# Patient Record
Sex: Female | Born: 1945 | Race: White | Hispanic: No | Marital: Married | Smoking: Former smoker
Health system: Southern US, Community
[De-identification: ages and names within clinical notes are randomized; demographics above are authoritative.]

## PROBLEM LIST (undated history)

## (undated) DIAGNOSIS — Z9889 Other specified postprocedural states: Secondary | ICD-10-CM

## (undated) DIAGNOSIS — M810 Age-related osteoporosis without current pathological fracture: Secondary | ICD-10-CM

## (undated) DIAGNOSIS — R112 Nausea with vomiting, unspecified: Secondary | ICD-10-CM

## (undated) DIAGNOSIS — K7460 Unspecified cirrhosis of liver: Secondary | ICD-10-CM

## (undated) DIAGNOSIS — J45909 Unspecified asthma, uncomplicated: Secondary | ICD-10-CM

## (undated) DIAGNOSIS — E785 Hyperlipidemia, unspecified: Secondary | ICD-10-CM

## (undated) DIAGNOSIS — N3010 Interstitial cystitis (chronic) without hematuria: Secondary | ICD-10-CM

## (undated) DIAGNOSIS — F32A Depression, unspecified: Secondary | ICD-10-CM

## (undated) DIAGNOSIS — J449 Chronic obstructive pulmonary disease, unspecified: Secondary | ICD-10-CM

## (undated) DIAGNOSIS — IMO0001 Reserved for inherently not codable concepts without codable children: Secondary | ICD-10-CM

## (undated) DIAGNOSIS — C801 Malignant (primary) neoplasm, unspecified: Secondary | ICD-10-CM

## (undated) DIAGNOSIS — S42309A Unspecified fracture of shaft of humerus, unspecified arm, initial encounter for closed fracture: Secondary | ICD-10-CM

## (undated) DIAGNOSIS — K219 Gastro-esophageal reflux disease without esophagitis: Secondary | ICD-10-CM

## (undated) DIAGNOSIS — I10 Essential (primary) hypertension: Secondary | ICD-10-CM

## (undated) DIAGNOSIS — F329 Major depressive disorder, single episode, unspecified: Secondary | ICD-10-CM

## (undated) DIAGNOSIS — J189 Pneumonia, unspecified organism: Secondary | ICD-10-CM

## (undated) DIAGNOSIS — F419 Anxiety disorder, unspecified: Secondary | ICD-10-CM

## (undated) DIAGNOSIS — E1140 Type 2 diabetes mellitus with diabetic neuropathy, unspecified: Secondary | ICD-10-CM

## (undated) DIAGNOSIS — M549 Dorsalgia, unspecified: Secondary | ICD-10-CM

## (undated) HISTORY — DX: Major depressive disorder, single episode, unspecified: F32.9

## (undated) HISTORY — DX: Age-related osteoporosis without current pathological fracture: M81.0

## (undated) HISTORY — DX: Malignant (primary) neoplasm, unspecified: C80.1

## (undated) HISTORY — DX: Depression, unspecified: F32.A

## (undated) HISTORY — DX: Unspecified asthma, uncomplicated: J45.909

## (undated) HISTORY — DX: Type 2 diabetes mellitus with diabetic neuropathy, unspecified: E11.40

## (undated) HISTORY — DX: Interstitial cystitis (chronic) without hematuria: N30.10

## (undated) HISTORY — DX: Unspecified fracture of shaft of humerus, unspecified arm, initial encounter for closed fracture: S42.309A

---

## 1984-03-05 HISTORY — PX: ABDOMINAL HYSTERECTOMY: SHX81

## 2003-01-05 ENCOUNTER — Other Ambulatory Visit: Admission: RE | Admit: 2003-01-05 | Discharge: 2003-01-05 | Payer: Self-pay | Admitting: Family Medicine

## 2004-05-15 ENCOUNTER — Other Ambulatory Visit: Admission: RE | Admit: 2004-05-15 | Discharge: 2004-05-15 | Payer: Self-pay | Admitting: *Deleted

## 2005-12-11 ENCOUNTER — Other Ambulatory Visit: Admission: RE | Admit: 2005-12-11 | Discharge: 2005-12-11 | Payer: Self-pay | Admitting: Family Medicine

## 2009-12-23 ENCOUNTER — Emergency Department (HOSPITAL_COMMUNITY): Admission: EM | Admit: 2009-12-23 | Discharge: 2009-12-23 | Payer: Self-pay | Admitting: Emergency Medicine

## 2010-02-01 ENCOUNTER — Encounter: Admission: RE | Admit: 2010-02-01 | Discharge: 2010-05-02 | Payer: Self-pay | Admitting: Family Medicine

## 2011-02-18 ENCOUNTER — Emergency Department (HOSPITAL_COMMUNITY)
Admission: EM | Admit: 2011-02-18 | Discharge: 2011-02-19 | Disposition: A | Discharge status: Home / Self Care | Payer: Medicare Other | Attending: Emergency Medicine | Admitting: Emergency Medicine

## 2011-02-18 ENCOUNTER — Emergency Department (HOSPITAL_COMMUNITY): Payer: Medicare Other

## 2011-02-18 DIAGNOSIS — M25559 Pain in unspecified hip: Secondary | ICD-10-CM | POA: Insufficient documentation

## 2011-02-18 DIAGNOSIS — J449 Chronic obstructive pulmonary disease, unspecified: Secondary | ICD-10-CM | POA: Insufficient documentation

## 2011-02-18 DIAGNOSIS — S42213A Unspecified displaced fracture of surgical neck of unspecified humerus, initial encounter for closed fracture: Secondary | ICD-10-CM | POA: Insufficient documentation

## 2011-02-18 DIAGNOSIS — S42293A Other displaced fracture of upper end of unspecified humerus, initial encounter for closed fracture: Secondary | ICD-10-CM | POA: Insufficient documentation

## 2011-02-18 DIAGNOSIS — J4489 Other specified chronic obstructive pulmonary disease: Secondary | ICD-10-CM | POA: Insufficient documentation

## 2011-02-18 DIAGNOSIS — R296 Repeated falls: Secondary | ICD-10-CM | POA: Insufficient documentation

## 2011-02-18 DIAGNOSIS — Y92009 Unspecified place in unspecified non-institutional (private) residence as the place of occurrence of the external cause: Secondary | ICD-10-CM | POA: Insufficient documentation

## 2011-02-18 DIAGNOSIS — M25519 Pain in unspecified shoulder: Secondary | ICD-10-CM | POA: Insufficient documentation

## 2011-02-18 DIAGNOSIS — E119 Type 2 diabetes mellitus without complications: Secondary | ICD-10-CM | POA: Insufficient documentation

## 2011-02-19 ENCOUNTER — Emergency Department (HOSPITAL_COMMUNITY): Payer: Medicare Other

## 2011-02-22 ENCOUNTER — Encounter: Payer: Self-pay | Admitting: Nurse Practitioner

## 2011-02-22 DIAGNOSIS — F418 Other specified anxiety disorders: Secondary | ICD-10-CM

## 2011-02-22 DIAGNOSIS — E119 Type 2 diabetes mellitus without complications: Secondary | ICD-10-CM | POA: Insufficient documentation

## 2011-02-22 DIAGNOSIS — E785 Hyperlipidemia, unspecified: Secondary | ICD-10-CM | POA: Insufficient documentation

## 2011-02-22 DIAGNOSIS — I10 Essential (primary) hypertension: Secondary | ICD-10-CM

## 2011-05-14 ENCOUNTER — Ambulatory Visit: Payer: Medicare Other | Attending: Orthopedic Surgery | Admitting: Physical Therapy

## 2011-05-14 DIAGNOSIS — IMO0001 Reserved for inherently not codable concepts without codable children: Secondary | ICD-10-CM | POA: Insufficient documentation

## 2011-05-14 DIAGNOSIS — R5381 Other malaise: Secondary | ICD-10-CM | POA: Insufficient documentation

## 2011-05-14 DIAGNOSIS — M25519 Pain in unspecified shoulder: Secondary | ICD-10-CM | POA: Insufficient documentation

## 2011-05-14 DIAGNOSIS — M25619 Stiffness of unspecified shoulder, not elsewhere classified: Secondary | ICD-10-CM | POA: Insufficient documentation

## 2011-05-16 ENCOUNTER — Ambulatory Visit: Payer: Medicare Other | Admitting: Physical Therapy

## 2011-05-23 ENCOUNTER — Encounter: Payer: Medicare Other | Admitting: Physical Therapy

## 2011-05-25 ENCOUNTER — Encounter: Payer: Medicare Other | Admitting: *Deleted

## 2011-05-28 ENCOUNTER — Encounter: Payer: Medicare Other | Admitting: Physical Therapy

## 2011-05-29 ENCOUNTER — Ambulatory Visit: Payer: Medicare Other | Admitting: *Deleted

## 2011-05-31 ENCOUNTER — Encounter: Payer: Medicare Other | Admitting: *Deleted

## 2011-06-02 ENCOUNTER — Emergency Department (HOSPITAL_COMMUNITY)
Admission: EM | Admit: 2011-06-02 | Discharge: 2011-06-02 | Disposition: A | Discharge status: Home / Self Care | Payer: Medicare Other | Attending: Emergency Medicine | Admitting: Emergency Medicine

## 2011-06-02 ENCOUNTER — Emergency Department (HOSPITAL_COMMUNITY): Payer: Medicare Other

## 2011-06-02 ENCOUNTER — Encounter: Payer: Self-pay | Admitting: Emergency Medicine

## 2011-06-02 DIAGNOSIS — R197 Diarrhea, unspecified: Secondary | ICD-10-CM | POA: Insufficient documentation

## 2011-06-02 DIAGNOSIS — R109 Unspecified abdominal pain: Secondary | ICD-10-CM

## 2011-06-02 DIAGNOSIS — R112 Nausea with vomiting, unspecified: Secondary | ICD-10-CM | POA: Insufficient documentation

## 2011-06-02 DIAGNOSIS — J449 Chronic obstructive pulmonary disease, unspecified: Secondary | ICD-10-CM | POA: Insufficient documentation

## 2011-06-02 DIAGNOSIS — R1031 Right lower quadrant pain: Secondary | ICD-10-CM | POA: Insufficient documentation

## 2011-06-02 DIAGNOSIS — F411 Generalized anxiety disorder: Secondary | ICD-10-CM

## 2011-06-02 DIAGNOSIS — I10 Essential (primary) hypertension: Secondary | ICD-10-CM | POA: Insufficient documentation

## 2011-06-02 DIAGNOSIS — Z79899 Other long term (current) drug therapy: Secondary | ICD-10-CM | POA: Insufficient documentation

## 2011-06-02 DIAGNOSIS — E119 Type 2 diabetes mellitus without complications: Secondary | ICD-10-CM | POA: Insufficient documentation

## 2011-06-02 DIAGNOSIS — J4489 Other specified chronic obstructive pulmonary disease: Secondary | ICD-10-CM | POA: Insufficient documentation

## 2011-06-02 HISTORY — DX: Hyperlipidemia, unspecified: E78.5

## 2011-06-02 HISTORY — DX: Essential (primary) hypertension: I10

## 2011-06-02 HISTORY — DX: Chronic obstructive pulmonary disease, unspecified: J44.9

## 2011-06-02 HISTORY — DX: Anxiety disorder, unspecified: F41.9

## 2011-06-02 LAB — DIFFERENTIAL
Lymphocytes Relative: 42 % (ref 12–46)
Lymphs Abs: 2.7 10*3/uL (ref 0.7–4.0)
Neutro Abs: 3.1 10*3/uL (ref 1.7–7.7)
Neutrophils Relative %: 49 % (ref 43–77)

## 2011-06-02 LAB — URINALYSIS, ROUTINE W REFLEX MICROSCOPIC
Glucose, UA: NEGATIVE mg/dL
Leukocytes, UA: NEGATIVE
Protein, ur: NEGATIVE mg/dL
Specific Gravity, Urine: <1.005 — ABNORMAL LOW (ref 1.005–1.030)
Urobilinogen, UA: 0.2 mg/dL (ref 0.0–1.0)

## 2011-06-02 LAB — WET PREP, GENITAL
Trich, Wet Prep: NONE SEEN
Yeast Wet Prep HPF POC: NONE SEEN

## 2011-06-02 LAB — BASIC METABOLIC PANEL
CO2: 23 mEq/L (ref 19–32)
Calcium: 9.4 mg/dL (ref 8.4–10.5)
GFR calc non Af Amer: >60 mL/min (ref >60)
Sodium: 142 mEq/L (ref 135–145)

## 2011-06-02 LAB — CBC
Platelets: 174 10*3/uL (ref 150–400)
RBC: 4.63 MIL/uL (ref 3.87–5.11)
WBC: 6.3 10*3/uL (ref 4.0–10.5)

## 2011-06-02 MED ORDER — LORAZEPAM 1 MG PO TABS: 1.0000 mg | ORAL_TABLET | Freq: Two times a day (BID) | ORAL | Status: AC | PRN

## 2011-06-02 MED ORDER — ONDANSETRON HCL 4 MG/2ML IJ SOLN
4.0000 mg | Freq: Once | INTRAMUSCULAR | Status: AC
  Administered 2011-06-02: 4 mg via INTRAVENOUS
  Filled 2011-06-02: qty 2

## 2011-06-02 MED ORDER — MORPHINE SULFATE 4 MG/ML IJ SOLN
4.0000 mg | Freq: Once | INTRAMUSCULAR | Status: DC
  Filled 2011-06-02: qty 1

## 2011-06-02 MED ORDER — SODIUM CHLORIDE 0.9 % IV SOLN
Freq: Once | INTRAVENOUS | Status: AC
  Administered 2011-06-02: 08:00:00 via INTRAVENOUS

## 2011-06-02 MED ORDER — LORAZEPAM 2 MG/ML IJ SOLN
1.0000 mg | Freq: Once | INTRAMUSCULAR | Status: AC
  Administered 2011-06-02: 1 mg via INTRAVENOUS
  Filled 2011-06-02: qty 1

## 2011-06-02 MED ORDER — IOHEXOL 300 MG/ML  SOLN
100.0000 mL | Freq: Once | INTRAMUSCULAR | Status: AC | PRN
  Administered 2011-06-02: 100 mL via INTRAVENOUS

## 2011-06-02 NOTE — ED Notes (Signed)
Pt drinking water contrast at this time.

## 2011-06-02 NOTE — ED Provider Notes (Signed)
Scribed for Dr. Ethelda Chick, the patient was seen in room 8. This chart was scribed by Jannette Fogo. This patient's care was started at 07:27.   History     Chief Complaint  Patient presents with  . Nausea  . Emesis  . Abdominal Pain   HPI Diana Pratt is a 65 y.o. female with a history of diabetes mellitus, COPD, anxiety, and high cholesterol who presents to the Emergency Department complaining of RLQ abdominal pain with started 2 days ago. Patient states pain has been intermittent since onset but today at 03:45AM it got worse and she was having difficulty sleeping due to pain. Abdominal pain is an "8"/10 in severity and it is exacerbated by urination. Patient reports associated mild nausea, one episode of emesis, two episodes of diarrhea yesterday, and burning with urination which radiates to the vagina. She denies any fever or hematochezia. Has not taken any medications for pain today. Patient reports a history of similar symptoms with a previous UTI but urine was negative at that time. Last normal bowel movement was Thursday 05/31/11.  Additionally, patient denies any recent headache, trouble breathing, back pain, but has a history of chronic back pain and right shoulder pain from a previous fracture. She reports chronic anxiety and ran out of her Ativan 2 days ago. Patient had a normal pelvic exam ~2 years ago per patient. She denies receiving a CT abdomen in the past. Reports a history of partial hysterectomy but no appendectomy.      Past Medical History  Diagnosis Date  . Hypertension   . COPD (chronic obstructive pulmonary disease)   . Diabetes mellitus   . Anxiety   . Hyperlipemia   Chronic right shoulder pain Chronic back pain.   Past Surgical History  Procedure Date  . Abdominal hysterectomy 03/1984    partial    MEDICATIONS:  Previous Medications   ACETAMINOPHEN (TYLENOL) 325 MG TABLET    Take 650 mg by mouth every 6 (six) hours as needed. pain   ALBUTEROL  (PROVENTIL) (2.5 MG/3ML) 0.083% NEBULIZER SOLUTION    Take 2.5 mg by nebulization every 6 (six) hours as needed. Shortness of breath    ALBUTEROL SULFATE (PROAIR HFA IN)    Inhale into the lungs. 2 inhalations QID prn   AMLODIPINE-VALSARTAN-HCTZ (EXFORGE HCT) 5-160-25 MG TABS    Take 1 tablet by mouth daily.     ASPIRIN 81 MG EC TABLET    Take 81 mg by mouth daily.     CALCIUM CARBONATE 200 MG CAPSULE    Take 250 mg by mouth daily.    CHOLECALCIFEROL (VITAMIN D) 2000 UNITS CAPS    Take by mouth.    ERGOCALCIFEROL (VITAMIN D2) 50000 UNITS CAPSULE    Take 50,000 Units by mouth once a week.     FLUTICASONE (FLONASE) 50 MCG/ACT NASAL SPRAY    Place 2 sprays into the nose daily as needed. Congestion    GLIPIZIDE (GLUCOTROL) 5 MG TABLET    Take 5 mg by mouth daily.    HYDROXYPROPYL METHYLCELLULOSE (ISOPTO TEARS) 2.5 % OPHTHALMIC SOLUTION    Place 1 drop into both eyes daily as needed. for dry eyes    LISINOPRIL-HYDROCHLOROTHIAZIDE (PRINZIDE,ZESTORETIC) 20-25 MG PER TABLET    Take 1 tablet by mouth daily.     LORAZEPAM (ATIVAN) 1 MG TABLET    Take 1 mg by mouth every 8 (eight) hours. One bid prn for anxiety   MELOXICAM (MOBIC) 15 MG TABLET    Take 15  mg by mouth daily.     METFORMIN (GLUCOPHAGE) 1000 MG TABLET    Take 1,000 mg by mouth 2 (two) times daily with a meal.     MULTIPLE VITAMINS-MINERALS (CENTRUM SILVER PO)    Take 1 tablet by mouth.    NAPROXEN SODIUM (ANAPROX) 220 MG TABLET    Take 220 mg by mouth as needed.     NEBIVOLOL (BYSTOLIC) 5 MG TABLET    Take 5 mg by mouth daily.     OXYCODONE-ACETAMINOPHEN (PERCOCET) 5-325 MG PER TABLET    Take 2 tablets by mouth every 4 (four) hours as needed.     PIOGLITAZONE (ACTOS) 30 MG TABLET    Take 30 mg by mouth daily.     ROSUVASTATIN (CRESTOR) 10 MG TABLET    Take 10 mg by mouth daily. Patient takes on Mon, Wed, Fri     ALLERGIES:  Allergies as of 06/02/2011 - Review Complete 06/02/2011  Allergen Reaction Noted  . Morphine and related Other (See  Comments) 06/02/2011  . Penicillins  02/22/2011  . Simvastatin Other (See Comments) 02/22/2011  . Sulfa antibiotics  02/22/2011  . Zetia (ezetimibe) Cough 02/22/2011  Morphine causes itching.     Family History  Problem Relation Age of Onset  . Parkinsonism Mother     History  Substance Use Topics  . Smoking status: Former Smoker    Quit date: 06/01/1994  . Smokeless tobacco: Never Used  . Alcohol Use: Yes     once a month  Accompanied to the ED by female companion ?spouse.   Review of Systems  Constitutional: Negative.   HENT: Negative.   Respiratory: Negative.   Cardiovascular: Negative.   Gastrointestinal: Positive for nausea, vomiting, abdominal pain and diarrhea.  Musculoskeletal: Positive for back pain (chronic).       Chronic right shoulder pain from history of fracture.   Skin: Negative.   Neurological: Negative.   Hematological: Negative.   Psychiatric/Behavioral: Negative.   All other systems reviewed and are negative.    Physical Exam  BP 145/73  Pulse 58  Temp(Src) 97.3 F (36.3 C) (Oral)  Resp 18  Ht 5\' 4"  (1.626 m)  Wt 204 lb (92.534 kg)  BMI 35.02 kg/m2  SpO2 95%  Physical Exam  Constitutional: She is oriented to person, place, and time. She appears well-developed and well-nourished.       Mildly anxious  HENT:  Head: Normocephalic and atraumatic.       Moist mucus membranes   Eyes: Conjunctivae are normal. Pupils are equal, round, and reactive to light.  Neck: Neck supple. Carotid bruit is not present. No tracheal deviation present. No thyromegaly present.  Cardiovascular: Normal rate, regular rhythm and normal heart sounds.   No murmur heard. Pulmonary/Chest: Effort normal and breath sounds normal.  Abdominal: Soft. Bowel sounds are normal. There is tenderness (mild RLQ). There is no guarding and no CVA tenderness.       Obese  Genitourinary:       Normal external genitalia. No lesions. Cervix is absent. White discharge. Minimal right  adnexal tenderness. No masses.   Musculoskeletal: Normal range of motion. She exhibits no edema and no tenderness.       Moving all extremities well.   Neurological: She is alert and oriented to person, place, and time. Coordination normal.  Skin: Skin is warm and dry. No rash noted.  Psychiatric: She has a normal mood and affect.     OTHER DATA REVIEWED: Nursing notes, vital signs,  and past medical records reviewed.   DIAGNOSTIC STUDIES: Oxygen Saturation is 99% on roomair, normal by my interpretation.   LABS / RADIOLOGY:  Results for orders placed during the hospital encounter of 06/02/11  URINALYSIS, ROUTINE W REFLEX MICROSCOPIC      Component Value Range   Color, Urine YELLOW  YELLOW    Appearance CLEAR  CLEAR    Specific Gravity, Urine <1.005 (*) 1.005 - 1.030    pH 6.0  5.0 - 8.0    Glucose, UA NEGATIVE  NEGATIVE (mg/dL)   Hgb urine dipstick NEGATIVE  NEGATIVE    Bilirubin Urine NEGATIVE  NEGATIVE    Ketones, ur NEGATIVE  NEGATIVE (mg/dL)   Protein, ur NEGATIVE  NEGATIVE (mg/dL)   Urobilinogen, UA 0.2  0.0 - 1.0 (mg/dL)   Nitrite NEGATIVE  NEGATIVE    Leukocytes, UA NEGATIVE  NEGATIVE   BASIC METABOLIC PANEL      Component Value Range   Sodium 142  135 - 145 (mEq/L)   Potassium 3.6  3.5 - 5.1 (mEq/L)   Chloride 102  96 - 112 (mEq/L)   CO2 23  19 - 32 (mEq/L)   Glucose, Bld 157 (*) 70 - 99 (mg/dL)   BUN 10  6 - 23 (mg/dL)   Creatinine, Ser 1.61  0.50 - 1.10 (mg/dL)   Calcium 9.4  8.4 - 09.6 (mg/dL)   GFR calc non Af Amer >60  >60 (mL/min)   GFR calc Af Amer >60  >60 (mL/min)  CBC      Component Value Range   WBC 6.3  4.0 - 10.5 (K/uL)   RBC 4.63  3.87 - 5.11 (MIL/uL)   Hemoglobin 14.5  12.0 - 15.0 (g/dL)   HCT 04.5  40.9 - 81.1 (%)   MCV 90.9  78.0 - 100.0 (fL)   MCH 31.3  26.0 - 34.0 (pg)   MCHC 34.4  30.0 - 36.0 (g/dL)   RDW 91.4  78.2 - 95.6 (%)   Platelets 174  150 - 400 (K/uL)  DIFFERENTIAL      Component Value Range   Neutrophils Relative 49  43 -  77 (%)   Neutro Abs 3.1  1.7 - 7.7 (K/uL)   Lymphocytes Relative 42  12 - 46 (%)   Lymphs Abs 2.7  0.7 - 4.0 (K/uL)   Monocytes Relative 7  3 - 12 (%)   Monocytes Absolute 0.5  0.1 - 1.0 (K/uL)   Eosinophils Relative 1  0 - 5 (%)   Eosinophils Absolute 0.1  0.0 - 0.7 (K/uL)   Basophils Relative 0  0 - 1 (%)   Basophils Absolute 0.0  0.0 - 0.1 (K/uL)  WET PREP, GENITAL      Component Value Range   Yeast, Wet Prep NONE SEEN  NONE SEEN    Trich, Wet Prep NONE SEEN  NONE SEEN    Clue Cells, Wet Prep NONE SEEN  NONE SEEN    WBC, Wet Prep HPF POC FEW (*) NONE SEEN     CT Abdomen: Interpreted by Radiologist Dr. Colin Rhein SHICK 1. No acute intra-abdominal or pelvic process. Normal appendix. 2. Bibasilar atelectasis 3. Probable tiny hepatic cyst.   ED COURSE / COORDINATION OF CARE: 07:45 - Patient requesting medication for anxiety. Morphine canceled and Ativan ordered.  08:00 - ED physician at bedside performing pelvic exam.  09:27 - Re-examined by ED physician, patient feels improved, discomfort improved.  11:38 - Re-examined, ED physician discussed results with the patient and  family. Patient feels improved, desired an RX of Ativan.     MDM: Take Tylenol for pain and Ativan BID dispensed #6, no refills.  Follow up with Dr. Christell Constant on Monday June 04, 2011.    IMPRESSION: 1. Nonspecific Abdominal pain  2. Anxiety    PLAN: Discharge  Follow up with Dr. Christell Constant on Monday June 04, 2011.   CONDITION ON DISCHARGE: Stable    MEDICATIONS GIVEN IN THE E.D.  Medications  oxyCODONE-acetaminophen (PERCOCET) 5-325 MG per tablet (not administered)  naproxen sodium (ANAPROX) 220 MG tablet (not administered)  ergocalciferol (VITAMIN D2) 50000 UNITS capsule (not administered)  Amlodipine-Valsartan-HCTZ (EXFORGE HCT) 5-160-25 MG TABS (not administered)  hydroxypropyl methylcellulose (ISOPTO TEARS) 2.5 % ophthalmic solution (not administered)  albuterol (PROVENTIL) (2.5 MG/3ML) 0.083%  nebulizer solution (not administered)  ondansetron (ZOFRAN) injection 4 mg (4 mg Intravenous Given 06/02/11 0814)  LORazepam (ATIVAN) injection 1 mg (1 mg Intravenous Given 06/02/11 0813)  0.9 %  sodium chloride infusion (  Intravenous New Bag 06/02/11 0813)  iohexol (OMNIPAQUE) 300 MG/ML injection 100 mL (100 mL Intravenous Contrast Given 06/02/11 1010)     DISCHARGE MEDICATIONS: New Prescriptions   No medications on file    Procedures   I personally performed the services described in this documentation, which was scribed in my presence.  The recorded information has been reviewed and considered.     Doug Sou, MD 06/02/11 1150

## 2011-06-02 NOTE — ED Notes (Signed)
In with edp to perform pelvic. Pt tolerated well with some pain and states she feels really dry in her vagina

## 2011-06-02 NOTE — ED Notes (Signed)
Patient c/o right side abd pain with nausea and vomiting that started this morning. Patient states "It feels like something is going to fall out of my vagina." Patient reports having diarrhea yesterday.

## 2011-06-02 NOTE — ED Notes (Signed)
Iv started and meds being given. Pt states she really wants s/t for her nerves. Has been out of ativan x 2 days. EDP aware and new orders given. Nad. Awaiting ct

## 2011-06-02 NOTE — ED Notes (Signed)
Pt returned from ct. Nad. No change.

## 2011-06-02 NOTE — ED Notes (Signed)
Pt less shaky and appears less anxious at this time. Aware awaiting BUN/Creat results then will received contrast for ct scan. nad

## 2011-06-02 NOTE — ED Notes (Signed)
Pt in ct at this time ° °

## 2011-06-04 LAB — GC/CHLAMYDIA PROBE AMP, GENITAL: GC Probe Amp, Genital: NEGATIVE

## 2011-06-05 ENCOUNTER — Ambulatory Visit: Payer: Medicare Other | Attending: Orthopedic Surgery | Admitting: Physical Therapy

## 2011-06-05 DIAGNOSIS — M25619 Stiffness of unspecified shoulder, not elsewhere classified: Secondary | ICD-10-CM | POA: Insufficient documentation

## 2011-06-05 DIAGNOSIS — R5381 Other malaise: Secondary | ICD-10-CM | POA: Insufficient documentation

## 2011-06-05 DIAGNOSIS — M25519 Pain in unspecified shoulder: Secondary | ICD-10-CM | POA: Insufficient documentation

## 2011-06-05 DIAGNOSIS — IMO0001 Reserved for inherently not codable concepts without codable children: Secondary | ICD-10-CM | POA: Insufficient documentation

## 2011-06-07 ENCOUNTER — Encounter: Admitting: Physical Therapy

## 2011-06-12 ENCOUNTER — Ambulatory Visit: Payer: Medicare Other | Attending: Orthopedic Surgery | Admitting: *Deleted

## 2011-06-12 DIAGNOSIS — IMO0001 Reserved for inherently not codable concepts without codable children: Secondary | ICD-10-CM | POA: Insufficient documentation

## 2011-06-12 DIAGNOSIS — M25519 Pain in unspecified shoulder: Secondary | ICD-10-CM | POA: Insufficient documentation

## 2011-06-12 DIAGNOSIS — R5381 Other malaise: Secondary | ICD-10-CM | POA: Insufficient documentation

## 2011-06-12 DIAGNOSIS — M25619 Stiffness of unspecified shoulder, not elsewhere classified: Secondary | ICD-10-CM | POA: Insufficient documentation

## 2011-06-14 ENCOUNTER — Ambulatory Visit: Payer: Medicare Other | Admitting: Physical Therapy

## 2011-06-19 ENCOUNTER — Ambulatory Visit: Payer: Medicare Other | Admitting: Physical Therapy

## 2011-06-21 ENCOUNTER — Ambulatory Visit: Payer: Medicare Other | Admitting: Physical Therapy

## 2011-06-26 ENCOUNTER — Encounter: Admitting: Physical Therapy

## 2011-06-28 ENCOUNTER — Ambulatory Visit: Payer: Medicare Other | Admitting: Physical Therapy

## 2011-07-02 ENCOUNTER — Ambulatory Visit: Payer: Medicare Other | Admitting: Physical Therapy

## 2011-07-04 ENCOUNTER — Encounter: Admitting: Physical Therapy

## 2011-07-10 ENCOUNTER — Ambulatory Visit: Payer: Medicare Other | Attending: Orthopedic Surgery | Admitting: Physical Therapy

## 2011-07-10 DIAGNOSIS — M25619 Stiffness of unspecified shoulder, not elsewhere classified: Secondary | ICD-10-CM | POA: Insufficient documentation

## 2011-07-10 DIAGNOSIS — M25519 Pain in unspecified shoulder: Secondary | ICD-10-CM | POA: Insufficient documentation

## 2011-07-10 DIAGNOSIS — IMO0001 Reserved for inherently not codable concepts without codable children: Secondary | ICD-10-CM | POA: Insufficient documentation

## 2011-07-10 DIAGNOSIS — R5381 Other malaise: Secondary | ICD-10-CM | POA: Insufficient documentation

## 2011-07-17 ENCOUNTER — Ambulatory Visit: Payer: Medicare Other | Admitting: *Deleted

## 2011-07-19 ENCOUNTER — Encounter: Admitting: Physical Therapy

## 2011-07-24 ENCOUNTER — Ambulatory Visit: Payer: Medicare Other | Admitting: Physical Therapy

## 2011-07-26 ENCOUNTER — Encounter: Admitting: Physical Therapy

## 2011-07-31 ENCOUNTER — Encounter: Admitting: Physical Therapy

## 2011-08-02 ENCOUNTER — Ambulatory Visit: Payer: Medicare Other | Admitting: Physical Therapy

## 2011-08-07 ENCOUNTER — Encounter: Admitting: Physical Therapy

## 2011-08-09 ENCOUNTER — Ambulatory Visit: Payer: Medicare Other | Attending: Orthopedic Surgery | Admitting: Physical Therapy

## 2011-08-09 DIAGNOSIS — M25619 Stiffness of unspecified shoulder, not elsewhere classified: Secondary | ICD-10-CM | POA: Insufficient documentation

## 2011-08-09 DIAGNOSIS — M25519 Pain in unspecified shoulder: Secondary | ICD-10-CM | POA: Insufficient documentation

## 2011-08-09 DIAGNOSIS — R5381 Other malaise: Secondary | ICD-10-CM | POA: Insufficient documentation

## 2011-08-09 DIAGNOSIS — IMO0001 Reserved for inherently not codable concepts without codable children: Secondary | ICD-10-CM | POA: Insufficient documentation

## 2011-08-14 ENCOUNTER — Ambulatory Visit: Payer: Medicare Other | Admitting: Physical Therapy

## 2013-01-21 ENCOUNTER — Other Ambulatory Visit: Payer: Self-pay | Admitting: *Deleted

## 2013-01-22 ENCOUNTER — Other Ambulatory Visit: Payer: Self-pay | Admitting: *Deleted

## 2013-01-22 ENCOUNTER — Telehealth: Payer: Self-pay | Admitting: Family Medicine

## 2013-01-22 MED ORDER — LORAZEPAM 1 MG PO TABS: ORAL_TABLET | ORAL | Status: DC

## 2013-01-22 NOTE — Telephone Encounter (Signed)
Pt aware to pick up rx at front desk 

## 2013-01-22 NOTE — Telephone Encounter (Signed)
meds refill loraszepamh provair inhaler  [facility]

## 2013-01-22 NOTE — Telephone Encounter (Signed)
I thought I signed and had the prescription faxed to pharmacy today.

## 2013-01-27 ENCOUNTER — Other Ambulatory Visit: Payer: Self-pay | Admitting: *Deleted

## 2013-01-27 MED ORDER — ALBUTEROL SULFATE HFA 108 (90 BASE) MCG/ACT IN AERS: 2.0000 | INHALATION_SPRAY | Freq: Four times a day (QID) | RESPIRATORY_TRACT | Status: DC | PRN

## 2013-02-27 ENCOUNTER — Ambulatory Visit (INDEPENDENT_AMBULATORY_CARE_PROVIDER_SITE_OTHER): Payer: Medicare Other | Admitting: Family Medicine

## 2013-02-27 ENCOUNTER — Encounter: Payer: Self-pay | Admitting: Family Medicine

## 2013-02-27 VITALS — BP 115/84 | HR 75 | Temp 97.2°F | Ht 63.5 in | Wt 195.0 lb

## 2013-02-27 DIAGNOSIS — F411 Generalized anxiety disorder: Secondary | ICD-10-CM

## 2013-02-27 DIAGNOSIS — I10 Essential (primary) hypertension: Secondary | ICD-10-CM

## 2013-02-27 DIAGNOSIS — J45909 Unspecified asthma, uncomplicated: Secondary | ICD-10-CM | POA: Insufficient documentation

## 2013-02-27 DIAGNOSIS — E119 Type 2 diabetes mellitus without complications: Secondary | ICD-10-CM

## 2013-02-27 DIAGNOSIS — E559 Vitamin D deficiency, unspecified: Secondary | ICD-10-CM

## 2013-02-27 DIAGNOSIS — E785 Hyperlipidemia, unspecified: Secondary | ICD-10-CM

## 2013-02-27 LAB — HEPATIC FUNCTION PANEL
ALT: 36 U/L — ABNORMAL HIGH (ref 0–35)
AST: 37 U/L (ref 0–37)
Albumin: 4.2 g/dL (ref 3.5–5.2)
Alkaline Phosphatase: 53 U/L (ref 39–117)
Bilirubin, Direct: 0.1 mg/dL (ref 0.0–0.3)
Indirect Bilirubin: 0.7 mg/dL (ref 0.0–0.9)
Total Bilirubin: 0.8 mg/dL (ref 0.3–1.2)
Total Protein: 7.3 g/dL (ref 6.0–8.3)

## 2013-02-27 LAB — BASIC METABOLIC PANEL WITH GFR
BUN: 14 mg/dL (ref 6–23)
CO2: 27 mEq/L (ref 19–32)
Calcium: 9.6 mg/dL (ref 8.4–10.5)
Chloride: 105 mEq/L (ref 96–112)
Creat: 0.89 mg/dL (ref 0.50–1.10)
GFR, Est African American: 78 mL/min
GFR, Est Non African American: 68 mL/min
Glucose, Bld: 108 mg/dL — ABNORMAL HIGH (ref 70–99)
Potassium: 3.8 mEq/L (ref 3.5–5.3)
Sodium: 141 mEq/L (ref 135–145)

## 2013-02-27 LAB — POCT GLYCOSYLATED HEMOGLOBIN (HGB A1C): Hemoglobin A1C: 5.9

## 2013-02-27 LAB — POCT UA - MICROALBUMIN: Microalbumin Ur, POC: NEGATIVE mg/dL

## 2013-02-27 MED ORDER — FENOFIBRATE 145 MG PO TABS: 145.0000 mg | ORAL_TABLET | Freq: Every day | ORAL | Status: DC

## 2013-02-27 MED ORDER — LORAZEPAM 1 MG PO TABS: ORAL_TABLET | ORAL | Status: DC

## 2013-02-27 MED ORDER — ESCITALOPRAM OXALATE 10 MG PO TABS: 10.0000 mg | ORAL_TABLET | Freq: Every day | ORAL | Status: DC

## 2013-02-27 NOTE — Progress Notes (Signed)
Patient ID: Diana Pratt, female   DOB: October 17, 1946, 67 y.o.   MRN: [id number] SUBJECTIVE: HPI: Patient is here for follow up of Diabetes Mellitus, hyperlipidemia/ hypertension Symptoms of DM:has had no Nocturia ,deniesUrinary Frequency ,denies Blurred vision ,deniesDizziness,denies.Dysuria,deniesparesthesias, deniesextremity pain or ulcers.Marland Kitchendenieschest pain. .has hadan annual eye exam. do check the feet. doescheck CBGs. Average CBG:________.Marland Kitchen deniesto episodes of hypoglycemia. doeshave an emergency hypoglycemic plan. admits toCompliance with medications. deniesProblems with medications.   PMH/PSH: reviewed/updated in Epic  SH/FH: reviewed/updated in Epic  Allergies: reviewed/updated in Epic  Medications: reviewed/updated in Epic  Immunizations: reviewed/updated in Epic  ROS: As above in the HPI. All other systems are stable or negative.  OBJECTIVE: APPEARANCE:  Patient in no acute distress.The patient appeared well nourished and normally developed. Acyanotic. Waist: VITAL SIGNS:  SKIN: warm and  Dry without overt rashes, tattoos and scars  HEAD and Neck: without JVD, Head and scalp: normal Eyes:No scleral icterus. Fundi normal, eye movements normal. Ears: Auricle normal, canal normal, Tympanic membranes normal, insufflation normal. Nose: normal Throat: normal Neck & thyroid: normal  CHEST & LUNGS: Chest wall: normal Lungs: Clear  CVS: Reveals the PMI to be normally located. Regular rhythm, First and Second Heart sounds are normal,  absence of murmurs, rubs or gallops. Peripheral vasculature: Radial pulses: normal Dorsal pedis pulses: normal Posterior pulses: normal  ABDOMEN:  Appearance: normal Benign,, no organomegaly, no masses, no Abdominal Aortic enlargement. No Guarding , no rebound. No Bruits. Bowel sounds: normal  RECTAL: N/A GU: N/A  EXTREMETIES: nonedematous. Both Femoral and Pedal pulses are normal.  MUSCULOSKELETAL:  Spine:  normal Joints: intact  NEUROLOGIC: oriented to time,place and person; nonfocal. Strength is normal Sensory is normal Reflexes are normal Cranial Nerves are normal.  ASSESSMENT: HTN (hypertension) - Plan: BASIC METABOLIC PANEL WITH GFR  Hyperlipemia - Plan: NMR Lipoprofile with Lipids, Hepatic function panel, fenofibrate (TRICOR) 145 MG tablet  Type 2 diabetes mellitus - Plan: POCT glycosylated hemoglobin (Hb A1C), POCT UA - Microalbumin  Unspecified asthma  Generalized anxiety disorder - Plan: LORazepam (ATIVAN) 1 MG tablet, escitalopram (LEXAPRO) 10 MG tablet  Unspecified vitamin D deficiency - Plan: Vitamin D 25 hydroxy   PLAN: Orders Placed This Encounter  Procedures  . NMR Lipoprofile with Lipids  . Vitamin D 25 hydroxy  . BASIC METABOLIC PANEL WITH GFR  . Hepatic function panel  . POCT glycosylated hemoglobin (Hb A1C)  . POCT UA - Microalbumin   Results for orders placed in visit on 02/27/13 (from the past 24 hour(s))  POCT UA - MICROALBUMIN     Status: None   Collection Time    02/27/13 10:15 AM      Result Value Range   Microalbumin Ur, POC neg    POCT GLYCOSYLATED HEMOGLOBIN (HGB A1C)     Status: None   Collection Time    02/27/13 10:16 AM      Result Value Range   Hemoglobin A1C 5.9%     Meds ordered this encounter  Medications  . EXFORGE 5-320 MG per tablet    Sig: Take 1 tablet by mouth daily.   Marland Kitchen DISCONTD: fenofibrate (TRICOR) 145 MG tablet    Sig: Take 145 mg by mouth daily.   . hydrochlorothiazide (HYDRODIURIL) 25 MG tablet    Sig: Take 25 mg by mouth daily.   Marland Kitchen LANTUS SOLOSTAR 100 UNIT/ML injection    Sig: Inject 29 Units into the skin.   . montelukast (SINGULAIR) 10 MG tablet    Sig: Take 10 mg  by mouth at bedtime.   . Blood Glucose Monitoring Suppl (CLEVER CHEK SYSTEM) W/DEVICE KIT    Sig:   . sitaGLIPtin (JANUVIA) 100 MG tablet    Sig: Take 100 mg by mouth daily.  Marland Kitchen LORazepam (ATIVAN) 1 MG tablet    Sig: One bid prn for anxiety     Dispense:  60 tablet    Refill:  0  . fenofibrate (TRICOR) 145 MG tablet    Sig: Take 1 tablet (145 mg total) by mouth daily.    Dispense:  30 tablet    Refill:  5  . escitalopram (LEXAPRO) 10 MG tablet    Sig: Take 1 tablet (10 mg total) by mouth daily.    Dispense:  30 tablet    Refill:  5       Dr Woodroe Mode Recommendations  Diet and Exercise discussed with patient.  For nutrition information, I recommend books:  1).Eat to Live by Dr Monico Hoar. 2).Prevent and Reverse Heart Disease by Dr Suzzette Righter.  Exercise recommendations are:  If unable to walk, then the patient can exercise in a chair 3 times a day. By flapping arms like a bird gently and raising legs outwards to the front.  If ambulatory, the patient can go for walks for 30 minutes 3 times a week. Then increase the intensity and duration as tolerated.  Goal is to try to attain exercise frequency to 5 times a week.  If applicable: Best to perform resistance exercises (machines or weights) 2 days a week and cardio type exercises 3 days per week.  RTC 3 months  Nhat Hearne P. Modesto Charon, M.D.

## 2013-02-27 NOTE — Patient Instructions (Addendum)
    Dr Glynda Soliday's Recommendations  Diet and Exercise discussed with patient.  For nutrition information, I recommend books:  1).Eat to Live by Dr Joel Fuhrman. 2).Prevent and Reverse Heart Disease by Dr Caldwell Esselstyn.  Exercise recommendations are:  If unable to walk, then the patient can exercise in a chair 3 times a day. By flapping arms like a bird gently and raising legs outwards to the front.  If ambulatory, the patient can go for walks for 30 minutes 3 times a week. Then increase the intensity and duration as tolerated.  Goal is to try to attain exercise frequency to 5 times a week.  If applicable: Best to perform resistance exercises (machines or weights) 2 days a week and cardio type exercises 3 days per week.   Diabetes and Exercise Regular exercise is important and can help:   Control blood glucose (sugar).  Decrease blood pressure.    Control blood lipids (cholesterol, triglycerides).  Improve overall health. BENEFITS FROM EXERCISE  Improved fitness.  Improved flexibility.  Improved endurance.  Increased bone density.  Weight control.  Increased muscle strength.  Decreased body fat.  Improvement of the body's use of insulin, a hormone.  Increased insulin sensitivity.  Reduction of insulin needs.  Reduced stress and tension.  Helps you feel better. People with diabetes who add exercise to their lifestyle gain additional benefits, including:  Weight loss.  Reduced appetite.  Improvement of the body's use of blood glucose.  Decreased risk factors for heart disease:  Lowering of cholesterol and triglycerides.  Raising the level of good cholesterol (high-density lipoproteins, HDL).  Lowering blood sugar.  Decreased blood pressure. TYPE 1 DIABETES AND EXERCISE  Exercise will usually lower your blood glucose.  If blood glucose is greater than 240 mg/dl, check urine ketones. If ketones are present, do not exercise.  Location  of the insulin injection sites may need to be adjusted with exercise. Avoid injecting insulin into areas of the body that will be exercised. For example, avoid injecting insulin into:  The arms when playing tennis.  The legs when jogging. For more information, discuss this with your caregiver.  Keep a record of:  Food intake.  Type and amount of exercise.  Expected peak times of insulin action.  Blood glucose levels. Do this before, during, and after exercise. Review your records with your caregiver. This will help you to develop guidelines for adjusting food intake and insulin amounts.  TYPE 2 DIABETES AND EXERCISE  Regular physical activity can help control blood glucose.  Exercise is important because it may:  Increase the body's sensitivity to insulin.  Improve blood glucose control.  Exercise reduces the risk of heart disease. It decreases serum cholesterol and triglycerides. It also lowers blood pressure.  Those who take insulin or oral hypoglycemic agents should watch for signs of hypoglycemia. These signs include dizziness, shaking, sweating, chills, and confusion.  Body water is lost during exercise. It must be replaced. This will help to avoid loss of body fluids (dehydration) or heat stroke. Be sure to talk to your caregiver before starting an exercise program to make sure it is safe for you. Remember, any activity is better than none.  Document Released: 01/12/2004 Document Revised: 01/14/2012 Document Reviewed: 04/28/2009 ExitCare Patient Information 2013 [employer].  Diabetes and Foot Care Diabetes may cause you to have a poor blood supply (circulation) to your legs and feet. Because of this, the skin may be thinner, break easier, and heal more slowly. You   also may have nerve damage in your legs and feet causing decreased feeling. You may not notice minor injuries to your feet that could lead to serious problems or infections. Taking care of your feet is one of  the most important things you can do for yourself.  HOME CARE INSTRUCTIONS  Do not go barefoot. Bare feet are easily injured.  Check your feet daily for blisters, cuts, and redness.  Wash your feet with warm water (not hot) and mild soap. Pat your feet and between your toes until completely dry.  Apply a moisturizing lotion that does not contain alcohol or petroleum jelly to the dry skin on your feet and to dry brittle toenails. Do not put it between your toes.  Trim your toenails straight across. Do not dig under them or around the cuticle.  Do not cut corns or calluses, or try to remove them with medicine.  Wear clean cotton socks or stockings every day. Make sure they are not too tight. Do not wear knee high stockings since they may decrease blood flow to your legs.  Wear leather shoes that fit properly and have enough cushioning. To break in new shoes, wear them just a few hours a day to avoid injuring your feet.  Wear shoes at all times, even in the house.  Do not cross your legs. This may decrease the blood flow to your feet.  If you find a minor scrape, cut, or break in the skin on your feet, keep it and the skin around it clean and dry. These areas may be cleansed with mild soap and water. Do not use peroxide, alcohol, iodine or Merthiolate.  When you remove an adhesive bandage, be sure not to harm the skin around it.  If you have a wound, look at it several times a day to make sure it is healing.  Do not use heating pads or hot water bottles. Burns can occur. If you have lost feeling in your feet or legs, you may not know it is happening until it is too late.  Report any cuts, sores or bruises to your caregiver. Do not wait! SEEK MEDICAL CARE IF:   You have an injury that is not healing or you notice redness, numbness, burning, or tingling.  Your feet always feel cold.  You have pain or cramps in your legs and feet. SEEK IMMEDIATE MEDICAL CARE IF:   There is  increasing redness, swelling, or increasing pain in the wound.  There is a red line that goes up your leg.  Pus is coming from a wound.  You develop an unexplained oral temperature above 102 F (38.9 C), or as your caregiver suggests.  You notice a bad smell coming from an ulcer or wound. MAKE SURE YOU:   Understand these instructions.  Will watch your condition.  Will get help right away if you are not doing well or get worse. Document Released: 10/19/2000 Document Revised: 01/14/2012 Document Reviewed: 04/27/2009 ExitCare Patient Information 2013 [employer].  

## 2013-02-28 LAB — VITAMIN D 25 HYDROXY (VIT D DEFICIENCY, FRACTURES): Vit D, 25-Hydroxy: 21 ng/mL — ABNORMAL LOW (ref 30–89)

## 2013-03-02 LAB — NMR LIPOPROFILE WITH LIPIDS
Cholesterol, Total: 256 mg/dL — ABNORMAL HIGH (ref <200)
HDL Particle Number: 25.7 umol/L — ABNORMAL LOW (ref >=30.5)
HDL Size: 8.4 nm — ABNORMAL LOW (ref >=9.2)
HDL-C: 38 mg/dL — ABNORMAL LOW (ref >=40)
LDL (calc): 171 mg/dL — ABNORMAL HIGH (ref <100)
LDL Particle Number: 2974 nmol/L — ABNORMAL HIGH (ref <1000)
LDL Size: 19.8 nm — ABNORMAL LOW (ref >20.5)
LP-IR Score: 76 — ABNORMAL HIGH (ref <=45)
Large HDL-P: <1.3 umol/L — ABNORMAL LOW (ref >=4.8)
Large VLDL-P: 4.8 nmol/L — ABNORMAL HIGH (ref <=2.7)
Small LDL Particle Number: 2245 nmol/L — ABNORMAL HIGH (ref <=527)
Triglycerides: 236 mg/dL — ABNORMAL HIGH (ref <150)
VLDL Size: 47.8 nm — ABNORMAL HIGH (ref <=46.6)

## 2013-03-04 ENCOUNTER — Other Ambulatory Visit: Payer: Self-pay | Admitting: Family Medicine

## 2013-03-04 MED ORDER — ROSUVASTATIN CALCIUM 20 MG PO TABS: 20.0000 mg | ORAL_TABLET | Freq: Every day | ORAL | Status: DC

## 2013-04-14 ENCOUNTER — Other Ambulatory Visit: Payer: Self-pay | Admitting: *Deleted

## 2013-04-14 DIAGNOSIS — F411 Generalized anxiety disorder: Secondary | ICD-10-CM

## 2013-04-14 NOTE — Telephone Encounter (Signed)
LAST RF 02/27/13. CALL IN [facility] IF APPROVED. ROUTE TO NURSE. LAST OV 02/27/13.

## 2013-04-15 ENCOUNTER — Telehealth: Payer: Self-pay | Admitting: Family Medicine

## 2013-04-15 MED ORDER — LORAZEPAM 1 MG PO TABS: ORAL_TABLET | ORAL | Status: DC

## 2013-04-16 ENCOUNTER — Other Ambulatory Visit: Payer: Self-pay | Admitting: Family Medicine

## 2013-04-16 DIAGNOSIS — F411 Generalized anxiety disorder: Secondary | ICD-10-CM

## 2013-04-16 MED ORDER — LORAZEPAM 1 MG PO TABS: ORAL_TABLET | ORAL | Status: DC

## 2013-04-16 NOTE — Telephone Encounter (Signed)
Ativan called in per request dr Elyse Hsu rx

## 2013-04-16 NOTE — Telephone Encounter (Signed)
Phone in please.

## 2013-05-29 ENCOUNTER — Ambulatory Visit (INDEPENDENT_AMBULATORY_CARE_PROVIDER_SITE_OTHER): Payer: Medicare Other

## 2013-05-29 ENCOUNTER — Ambulatory Visit (INDEPENDENT_AMBULATORY_CARE_PROVIDER_SITE_OTHER): Payer: Medicare Other | Admitting: Family Medicine

## 2013-05-29 ENCOUNTER — Encounter: Payer: Self-pay | Admitting: Family Medicine

## 2013-05-29 VITALS — BP 141/69 | HR 98 | Temp 97.5°F | Ht 63.0 in | Wt 194.8 lb

## 2013-05-29 DIAGNOSIS — E119 Type 2 diabetes mellitus without complications: Secondary | ICD-10-CM

## 2013-05-29 DIAGNOSIS — I10 Essential (primary) hypertension: Secondary | ICD-10-CM

## 2013-05-29 DIAGNOSIS — F411 Generalized anxiety disorder: Secondary | ICD-10-CM

## 2013-05-29 DIAGNOSIS — IMO0002 Reserved for concepts with insufficient information to code with codable children: Secondary | ICD-10-CM

## 2013-05-29 DIAGNOSIS — D4989 Neoplasm of unspecified behavior of other specified sites: Secondary | ICD-10-CM

## 2013-05-29 DIAGNOSIS — E559 Vitamin D deficiency, unspecified: Secondary | ICD-10-CM | POA: Insufficient documentation

## 2013-05-29 DIAGNOSIS — E785 Hyperlipidemia, unspecified: Secondary | ICD-10-CM

## 2013-05-29 DIAGNOSIS — S39012A Strain of muscle, fascia and tendon of lower back, initial encounter: Secondary | ICD-10-CM

## 2013-05-29 DIAGNOSIS — M5416 Radiculopathy, lumbar region: Secondary | ICD-10-CM

## 2013-05-29 LAB — COMPLETE METABOLIC PANEL WITH GFR
ALT: 37 U/L — ABNORMAL HIGH (ref 0–35)
AST: 34 U/L (ref 0–37)
Albumin: 4.3 g/dL (ref 3.5–5.2)
Alkaline Phosphatase: 57 U/L (ref 39–117)
BUN: 12 mg/dL (ref 6–23)
CO2: 27 mEq/L (ref 19–32)
Calcium: 9.9 mg/dL (ref 8.4–10.5)
Chloride: 104 mEq/L (ref 96–112)
Creat: 0.84 mg/dL (ref 0.50–1.10)
GFR, Est African American: 83 mL/min
GFR, Est Non African American: 72 mL/min
Glucose, Bld: 82 mg/dL (ref 70–99)
Potassium: 4.7 mEq/L (ref 3.5–5.3)
Sodium: 142 mEq/L (ref 135–145)
Total Bilirubin: 0.6 mg/dL (ref 0.3–1.2)
Total Protein: 7.1 g/dL (ref 6.0–8.3)

## 2013-05-29 LAB — POCT GLYCOSYLATED HEMOGLOBIN (HGB A1C): Hemoglobin A1C: 5.7

## 2013-05-29 LAB — POCT UA - MICROALBUMIN: Microalbumin Ur, POC: 0 mg/L

## 2013-05-29 MED ORDER — INSULIN GLARGINE 100 UNIT/ML SOLOSTAR PEN: 29.0000 [IU] | PEN_INJECTOR | Freq: Every day | SUBCUTANEOUS | Status: DC

## 2013-05-29 MED ORDER — LORAZEPAM 1 MG PO TABS: ORAL_TABLET | ORAL | Status: DC

## 2013-05-29 NOTE — Patient Instructions (Signed)
      Dr Patric Vanpelt's Recommendations  Diet and Exercise discussed with patient.  For nutrition information, I recommend books:  1).Eat to Live by Dr Joel Fuhrman. 2).Prevent and Reverse Heart Disease by Dr Caldwell Esselstyn. 3) Dr Neal Barnard's Book:  Program to Reverse Diabetes  Exercise recommendations are:  If unable to walk, then the patient can exercise in a chair 3 times a day. By flapping arms like a bird gently and raising legs outwards to the front.  If ambulatory, the patient can go for walks for 30 minutes 3 times a week. Then increase the intensity and duration as tolerated.  Goal is to try to attain exercise frequency to 5 times a week.  If applicable: Best to perform resistance exercises (machines or weights) 2 days a week and cardio type exercises 3 days per week.  

## 2013-05-29 NOTE — Progress Notes (Signed)
Patient ID: Diana Pratt, female   DOB: [birth year], 67 y.o.   MRN: [id number] SUBJECTIVE: CC: Chief Complaint  Patient presents with  . follow up 3 month    fasting c/o back pain after doing crunches . pain in back and left leg burning pain    HPI: Patient is here for follow up of Diabetes Mellitus: Symptoms of DM: Denies Nocturia ,Denies Urinary Frequency , denies Blurred vision ,deniesDizziness,denies.Dysuria,denies paresthesias, denies extremity pain or ulcers.Marland Kitchendenies chest pain. has had an annual eye exam. do check the feet. Does check CBGs. Average CBG:110-130 Denies episodes of hypoglycemia. Does have an emergency hypoglycemic plan. admits toCompliance with medications. Denies Problems with medications.  Hurt her back, burning pain and it shot down the leg.  Past Medical History  Diagnosis Date  . Hypertension   . COPD (chronic obstructive pulmonary disease)   . Diabetes mellitus   . Anxiety   . Hyperlipemia   . Asthma   . Depression    Past Surgical History  Procedure Laterality Date  . Abdominal hysterectomy  03/1984    partial   History   Social History  . Marital Status: Married    Spouse Name: N/A    Number of Children: N/A  . Years of Education: N/A   Occupational History  . Not on file.   Social History Main Topics  . Smoking status: Former Smoker    Quit date: 06/01/1994  . Smokeless tobacco: Never Used  . Alcohol Use: Yes     Comment: once a month  . Drug Use: No  . Sexually Active: No   Other Topics Concern  . Not on file   Social History Narrative  . No narrative on file   Family History  Problem Relation Age of Onset  . Parkinsonism Mother    Current Outpatient Prescriptions on File Prior to Visit  Medication Sig Dispense Refill  . acetaminophen (TYLENOL) 325 MG tablet Take 650 mg by mouth every 6 (six) hours as needed. pain      . albuterol (PROAIR HFA) 108 (90 BASE) MCG/ACT inhaler Inhale 2 puffs into the lungs every 6 (six)  hours as needed.  8.5 g  0  . albuterol (PROVENTIL) (2.5 MG/3ML) 0.083% nebulizer solution Take 2.5 mg by nebulization every 6 (six) hours as needed. Shortness of breath       . aspirin 81 MG EC tablet Take 81 mg by mouth daily.        . Blood Glucose Monitoring Suppl (CLEVER CHEK SYSTEM) W/DEVICE KIT       . calcium carbonate 200 MG capsule Take 250 mg by mouth daily.       . Cholecalciferol (VITAMIN D) 2000 UNITS CAPS Take by mouth.       . escitalopram (LEXAPRO) 10 MG tablet Take 1 tablet (10 mg total) by mouth daily.  30 tablet  5  . EXFORGE 5-320 MG per tablet Take 1 tablet by mouth daily.       . fenofibrate (TRICOR) 145 MG tablet Take 1 tablet (145 mg total) by mouth daily.  30 tablet  5  . fluticasone (FLONASE) 50 MCG/ACT nasal spray Place 2 sprays into the nose daily as needed. Congestion       . glipiZIDE (GLUCOTROL) 5 MG tablet Take 5 mg by mouth daily.       . hydroxypropyl methylcellulose (ISOPTO TEARS) 2.5 % ophthalmic solution Place 1 drop into both eyes daily as needed. for dry eyes       .  montelukast (SINGULAIR) 10 MG tablet Take 10 mg by mouth at bedtime.       . Multiple Vitamins-Minerals (CENTRUM SILVER PO) Take 1 tablet by mouth.       . naproxen sodium (ANAPROX) 220 MG tablet Take 220 mg by mouth as needed.        . sitaGLIPtin (JANUVIA) 100 MG tablet Take 100 mg by mouth daily.      . hydrochlorothiazide (HYDRODIURIL) 25 MG tablet Take 25 mg by mouth daily.       . metFORMIN (GLUCOPHAGE) 1000 MG tablet Take 250 mg by mouth daily with breakfast. 1/4 tablet daily      . rosuvastatin (CRESTOR) 20 MG tablet Take 1 tablet (20 mg total) by mouth daily. Patient takes on Mon, Wed, Fri  30 tablet  3   No current facility-administered medications on file prior to visit.   Allergies  Allergen Reactions  . Morphine And Related Other (See Comments)    Burning, itching  . Penicillins   . Simvastatin Other (See Comments)    myalgias  . Sulfa Antibiotics   . Zetia (Ezetimibe)  Cough   Immunization History  Administered Date(s) Administered  . DTaP 01/04/2003   Prior to Admission medications   Medication Sig Start Date End Date Taking? Authorizing Provider  acetaminophen (TYLENOL) 325 MG tablet Take 650 mg by mouth every 6 (six) hours as needed. pain   Yes Historical Provider, MD  albuterol (PROAIR HFA) 108 (90 BASE) MCG/ACT inhaler Inhale 2 puffs into the lungs every 6 (six) hours as needed. 01/27/13  Yes Ileana Ladd, MD  albuterol (PROVENTIL) (2.5 MG/3ML) 0.083% nebulizer solution Take 2.5 mg by nebulization every 6 (six) hours as needed. Shortness of breath    Yes Historical Provider, MD  Alcohol Swabs (ALCOHOL PADS) 70 % PADS  04/06/13  Yes Historical Provider, MD  aspirin 81 MG EC tablet Take 81 mg by mouth daily.     Yes Historical Provider, MD  Blood Glucose Monitoring Suppl (CLEVER CHEK SYSTEM) W/DEVICE KIT  12/11/12  Yes Historical Provider, MD  calcium carbonate 200 MG capsule Take 250 mg by mouth daily.    Yes Historical Provider, MD  Cholecalciferol (VITAMIN D) 2000 UNITS CAPS Take by mouth.    Yes Historical Provider, MD  escitalopram (LEXAPRO) 10 MG tablet Take 1 tablet (10 mg total) by mouth daily. 02/27/13  Yes Ileana Ladd, MD  EXFORGE 5-320 MG per tablet Take 1 tablet by mouth daily.  01/20/13  Yes Historical Provider, MD  fenofibrate (TRICOR) 145 MG tablet Take 1 tablet (145 mg total) by mouth daily. 02/27/13  Yes Ileana Ladd, MD  fluticasone (FLONASE) 50 MCG/ACT nasal spray Place 2 sprays into the nose daily as needed. Congestion    Yes Historical Provider, MD  glipiZIDE (GLUCOTROL) 5 MG tablet Take 5 mg by mouth daily.    Yes Historical Provider, MD  hydroxypropyl methylcellulose (ISOPTO TEARS) 2.5 % ophthalmic solution Place 1 drop into both eyes daily as needed. for dry eyes    Yes Historical Provider, MD  Insulin Glargine (LANTUS SOLOSTAR) 100 UNIT/ML SOPN Inject 29 Units into the skin daily at 10 pm. 05/29/13  Yes Ileana Ladd, MD  LANCETS  ULTRA THIN MISC  04/06/13  Yes Historical Provider, MD  LORazepam (ATIVAN) 1 MG tablet One bid prn for anxiety 05/29/13  Yes Ileana Ladd, MD  montelukast (SINGULAIR) 10 MG tablet Take 10 mg by mouth at bedtime.  12/26/12  Yes Historical  Provider, MD  Multiple Vitamins-Minerals (CENTRUM SILVER PO) Take 1 tablet by mouth.    Yes Historical Provider, MD  naproxen sodium (ANAPROX) 220 MG tablet Take 220 mg by mouth as needed.     Yes Historical Provider, MD  sitaGLIPtin (JANUVIA) 100 MG tablet Take 100 mg by mouth daily.   Yes Historical Provider, MD  glucose blood test strip  04/06/13   Historical Provider, MD  hydrochlorothiazide (HYDRODIURIL) 25 MG tablet Take 25 mg by mouth daily.  01/20/13   Historical Provider, MD  metFORMIN (GLUCOPHAGE) 1000 MG tablet Take 250 mg by mouth daily with breakfast. 1/4 tablet daily    Historical Provider, MD  rosuvastatin (CRESTOR) 20 MG tablet Take 1 tablet (20 mg total) by mouth daily. Patient takes on Mon, Wed, Fri 03/04/13   Ileana Ladd, MD    ROS: As above in the HPI. All other systems are stable or negative.  OBJECTIVE: APPEARANCE:  Patient in no acute distress.The patient appeared well nourished and normally developed. Acyanotic. Waist: VITAL SIGNS:BP 141/69  Pulse 98  Temp(Src) 97.5 F (36.4 C) (Oral)  Ht 5\' 3"  (1.6 m)  Wt 194 lb 12.8 oz (88.361 kg)  BMI 34.52 kg/m2  Obese WF SKIN: warm and  Dry without overt rashes, tattoos and scars.has a warty lesion on the right external nostril  HEAD and Neck: without JVD, Head and scalp: normal Eyes:No scleral icterus. Fundi normal, eye movements normal. Ears: Auricle normal, canal normal, Tympanic membranes normal, insufflation normal. Nose: normal nasal mucosa Ok. The  External nostril has the fingerlike warty lesion. Throat: normal Neck & thyroid: normal  CHEST & LUNGS: Chest wall: normal Lungs: Clear  CVS: Reveals the PMI to be normally located. Regular rhythm, First and Second Heart sounds  are normal,  absence of murmurs, rubs or gallops. Peripheral vasculature: Radial pulses: normal Dorsal pedis pulses: normal Posterior pulses: normal  ABDOMEN:  Appearance obese Benign, no organomegaly, no masses, no Abdominal Aortic enlargement. No Guarding , no rebound. No Bruits. Bowel sounds: normal  RECTAL: N/A GU: N/A  EXTREMETIES: nonedematous. Both Femoral and Pedal pulses are normal.  MUSCULOSKELETAL:  Spine: SLR positive. Reduced ROM. Joints: intact  NEUROLOGIC: oriented to time,place and person; nonfocal. Strength is normal Sensory is normal Reflexes are normal Cranial Nerves are normal.  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  For nutrition information, I recommend books:  1).Eat to Live by Dr Monico Hoar. 2).Prevent and Reverse Heart Disease by Dr Suzzette Righter. 3) Dr Katherina Right Book:  Program to Reverse Diabetes  Exercise recommendations are:  If unable to walk, then the patient can exercise in a chair 3 times a day. By flapping arms like a bird gently and raising legs outwards to the front.  If ambulatory, the  patient can go for walks for 30 minutes 3 times a week. Then increase the  intensity and duration as tolerated.  Goal is to try to attain exercise frequency to 5 times a week.  If applicable: Best to perform resistance exercises (machines or weights) 2 days a week and cardio type exercises 3 days per week.  [facility] reading (PRIMARY) by  Dr. Modesto Charon:  Degenerative changes. Otherwise no other significant acute changes.                            Orders Placed This Encounter  Procedures  . DG Lumbar Spine 2-3 Views    Standing Status: Future     Number of Occurrences: 1     Standing Expiration Date: 07/29/2014    Order Specific Question:  Reason for Exam (SYMPTOM  OR DIAGNOSIS REQUIRED)    Answer:  radicular low back painafter back strain    Order Specific Question:  Preferred imaging location?    Answer:  Internal  . COMPLETE METABOLIC PANEL WITH GFR  . NMR Lipoprofile with Lipids  . Vitamin D 25 hydroxy  . Ambulatory referral to Dermatology    Referral Priority:  Routine    Referral Type:  Consultation    Referral Reason:  Specialty Services Required    Requested Specialty:  Dermatology    Number of Visits Requested:  1  . POCT glycosylated hemoglobin (Hb A1C)  . POCT UA - Microalbumin   Meds ordered this encounter  Medications  . Alcohol Swabs (ALCOHOL PADS) 70 % PADS    Sig:   . LANCETS ULTRA THIN MISC    Sig:   . glucose blood test strip    Sig:   . DISCONTD: LORazepam (ATIVAN) 1 MG tablet    Sig: One bid prn for anxiety    Dispense:  60 tablet    Refill:  2  . Insulin Glargine (LANTUS SOLOSTAR) 100 UNIT/ML SOPN    Sig: Inject 29 Units into the skin daily at 10 pm.    Dispense:  12 mL    Refill:  11  . LORazepam (ATIVAN) 1 MG tablet    Sig: One bid prn for anxiety    Dispense:  60 tablet    Refill:  2    Return in about 3 months (around 08/29/2013) for Recheck medical problems.  Almina Schul P. Modesto Charon, M.D.

## 2013-05-30 LAB — VITAMIN D 25 HYDROXY (VIT D DEFICIENCY, FRACTURES): Vit D, 25-Hydroxy: 26 ng/mL — ABNORMAL LOW (ref 30–89)

## 2013-06-01 LAB — NMR LIPOPROFILE WITH LIPIDS
Cholesterol, Total: 243 mg/dL — ABNORMAL HIGH (ref <200)
HDL Particle Number: 20.2 umol/L — ABNORMAL LOW (ref >=30.5)
HDL Size: 9.3 nm (ref >=9.2)
HDL-C: 30 mg/dL — ABNORMAL LOW (ref >=40)
LDL (calc): 152 mg/dL — ABNORMAL HIGH (ref <100)
LDL Particle Number: 2881 nmol/L — ABNORMAL HIGH (ref <1000)
LDL Size: 19.6 nm — ABNORMAL LOW (ref >20.5)
LP-IR Score: 65 — ABNORMAL HIGH (ref <=45)
Large HDL-P: 2.2 umol/L — ABNORMAL LOW (ref >=4.8)
Large VLDL-P: 8.3 nmol/L — ABNORMAL HIGH (ref <=2.7)
Small LDL Particle Number: >2300 nmol/L — ABNORMAL HIGH (ref <=527)
Triglycerides: 304 mg/dL — ABNORMAL HIGH (ref <150)
VLDL Size: 48.2 nm — ABNORMAL HIGH (ref <=46.6)

## 2013-06-08 ENCOUNTER — Ambulatory Visit (INDEPENDENT_AMBULATORY_CARE_PROVIDER_SITE_OTHER): Payer: Medicare Other | Admitting: Pharmacist

## 2013-06-08 VITALS — BP 132/77 | HR 80 | Ht 63.0 in | Wt 196.0 lb

## 2013-06-08 DIAGNOSIS — E785 Hyperlipidemia, unspecified: Secondary | ICD-10-CM

## 2013-06-08 MED ORDER — PITAVASTATIN CALCIUM 2 MG PO TABS: 2.0000 mg | ORAL_TABLET | Freq: Every day | ORAL | Status: DC

## 2013-06-10 ENCOUNTER — Other Ambulatory Visit: Payer: Self-pay

## 2013-06-10 ENCOUNTER — Encounter: Payer: Self-pay | Admitting: Pharmacist

## 2013-06-10 NOTE — Progress Notes (Signed)
Lipid Clinic Consultation  Chief Complaint:   Chief Complaint  Patient presents with  . Hyperlipidemia    Filed Vitals:   06/10/13 2132  BP: 132/77  Pulse: 80     Exam Edema:  negative Respirations:  normal   Carotid Bruits:  negative Xanthomas:  negative General Appearance:  alert, oriented, no acute distress and obese Mood/Affect:  normal  HPI:  Patient first visit to lipid clinic.  She has tried crestor and lipitor in the past but both caused myalgias.  She has also tried zetia but she states that she a cough with it.   She is currently taking fenofibrate 145mg  daily.     Component Value Date/Time   TRIG 304* 05/29/2013 1037  other lipids from 05/29/13  LDL-P = 2881  LDL-C = 152   HDL-C = 30   CHD/CHF Risk Equivalents:  DM Primary Problem(s):  LDL or LDL-P elevated, HDL or HDL-P decreased and TG elevated  Current NCEP Goals: LDL Goal < 100 HDL Goal >/= 45 Tg Goal < 191 Non-HDL Goal < 130  Secondary cause of hyperlipidemia present:  Type 2 DM - which is currently well controlled - last A1c was 5.7% HBG reading show am fasting BG 104 to 127 Low fat diet followed?  No -   Low carb diet followed?  Yes -   Exercise?  No - due to recent pulled back   Assessment: Hyperlipidemia Type 2 DM  Recommendations: Changes in lipid medication(s):  Add livalo 4mg  (take 1/2 tablet daily) Check BG daily but at varying times - am, pm, before and after meals Discussed diet - limit hight fat food, increase lean proteins, vegetables, whole grains and fruit (1/2 cup serving sizes).   Chair exercises as able Recheck Lipid Panel:  6 weeks Other labs needed:  Thyroid panel / LFT's  Time spent counseling patient:  30 mintues        Laken Lobato, PHARMD

## 2013-07-09 ENCOUNTER — Ambulatory Visit: Payer: Self-pay

## 2013-08-27 ENCOUNTER — Other Ambulatory Visit: Payer: Self-pay | Admitting: Family Medicine

## 2013-09-03 ENCOUNTER — Ambulatory Visit (INDEPENDENT_AMBULATORY_CARE_PROVIDER_SITE_OTHER): Payer: Medicare Other | Admitting: Nurse Practitioner

## 2013-09-03 VITALS — BP 202/88 | HR 92 | Temp 97.8°F | Ht 63.0 in | Wt 195.0 lb

## 2013-09-03 DIAGNOSIS — N390 Urinary tract infection, site not specified: Secondary | ICD-10-CM

## 2013-09-03 DIAGNOSIS — R300 Dysuria: Secondary | ICD-10-CM

## 2013-09-03 LAB — POCT URINALYSIS DIPSTICK
Glucose, UA: NEGATIVE
Nitrite, UA: POSITIVE
Protein, UA: NEGATIVE
Spec Grav, UA: 1.025
Urobilinogen, UA: NEGATIVE

## 2013-09-03 LAB — POCT UA - MICROSCOPIC ONLY
Casts, Ur, LPF, POC: NEGATIVE
Crystals, Ur, HPF, POC: NEGATIVE

## 2013-09-03 MED ORDER — CIPROFLOXACIN HCL 500 MG PO TABS: 500.0000 mg | ORAL_TABLET | Freq: Two times a day (BID) | ORAL | Status: DC

## 2013-09-03 NOTE — Progress Notes (Signed)
Subjective:    Patient ID: Diana Pratt, female    DOB: 1946-01-18, 67 y.o.   MRN: [id number]  Abdominal Pain This is a new problem. The current episode started today. The onset quality is gradual. The problem occurs constantly. The problem has been gradually worsening. The pain is located in the suprapubic region. The pain is at a severity of 5/10. The pain is moderate. The quality of the pain is cramping. Associated symptoms include frequency and nausea. The pain is aggravated by urination. The pain is relieved by nothing. She has tried acetaminophen for the symptoms. The treatment provided no relief.  Urinary Tract Infection  This is a new problem. The current episode started in the past 7 days (Monday). The problem occurs every urination. The problem has been gradually worsening. The quality of the pain is described as burning. The pain is at a severity of 5/10. The pain is moderate. There has been no fever. There is no history of pyelonephritis. Associated symptoms include frequency, nausea and urgency. She has tried acetaminophen for the symptoms. The treatment provided no relief.      Review of Systems  Gastrointestinal: Positive for nausea and abdominal pain.  Genitourinary: Positive for urgency and frequency.  All other systems reviewed and are negative.       Objective:   Physical Exam  Vitals reviewed. Constitutional: She is oriented to person, place, and time. She appears well-developed and well-nourished.  Cardiovascular: Normal rate, regular rhythm, normal heart sounds and intact distal pulses.   Pulmonary/Chest: Effort normal and breath sounds normal.  Abdominal: Soft. Bowel sounds are normal. There is tenderness.  Neurological: She is alert and oriented to person, place, and time.  Psychiatric: She has a normal mood and affect. Her behavior is normal. Judgment and thought content normal.   BP 202/88  Pulse 92  Temp(Src) 97.8 F (36.6 C)  Ht 5\' 3"  (1.6 m)  Wt 195  lb (88.451 kg)  BMI 34.55 kg/m2  Results for orders placed in visit on 09/03/13  POCT UA - MICROSCOPIC ONLY      Result Value Range   WBC, Ur, HPF, POC 10-15     RBC, urine, microscopic 1-5     Bacteria, U Microscopic mod     Mucus, UA neg     Epithelial cells, urine per micros occ     Crystals, Ur, HPF, POC neg     Casts, Ur, LPF, POC neg     Yeast, UA occ    POCT URINALYSIS DIPSTICK      Result Value Range   Color, UA yellow     Clarity, UA cloudy     Glucose, UA neg     Bilirubin, UA neg     Ketones, UA neg     Spec Grav, UA 1.025     Blood, UA trace     pH, UA 5.0     Protein, UA neg     Urobilinogen, UA negative     Nitrite, UA pos     Leukocytes, UA Trace           Assessment & Plan:   1. Dysuria   2. UTI (urinary tract infection)    Meds ordered this encounter  Medications  . ciprofloxacin (CIPRO) 500 MG tablet    Sig: Take 1 tablet (500 mg total) by mouth 2 (two) times daily.    Dispense:  14 tablet    Refill:  0    Order Specific Question:  Supervising Provider    Answer:  Ernestina Penna [[id number]]   Force fluids- cranberry juice AZO standard OTC RTO prn  Mary-Margaret Daphine Deutscher, FNP

## 2013-09-03 NOTE — Patient Instructions (Signed)
Urinary Tract Infection  Urinary tract infections (UTIs) can develop anywhere along your urinary tract. Your urinary tract is your body's drainage system for removing wastes and extra water. Your urinary tract includes two kidneys, two ureters, a bladder, and a urethra. Your kidneys are a pair of bean-shaped organs. Each kidney is about the size of your fist. They are located below your ribs, one on each side of your spine.  CAUSES  Infections are caused by microbes, which are microscopic organisms, including fungi, viruses, and bacteria. These organisms are so small that they can only be seen through a microscope. Bacteria are the microbes that most commonly cause UTIs.  SYMPTOMS   Symptoms of UTIs may vary by age and gender of the patient and by the location of the infection. Symptoms in young women typically include a frequent and intense urge to urinate and a painful, burning feeling in the bladder or urethra during urination. Older women and men are more likely to be tired, shaky, and weak and have muscle aches and abdominal pain. A fever may mean the infection is in your kidneys. Other symptoms of a kidney infection include pain in your back or sides below the ribs, nausea, and vomiting.  DIAGNOSIS  To diagnose a UTI, your caregiver will ask you about your symptoms. Your caregiver also will ask to provide a urine sample. The urine sample will be tested for bacteria and white blood cells. White blood cells are made by your body to help fight infection.  TREATMENT   Typically, UTIs can be treated with medication. Because most UTIs are caused by a bacterial infection, they usually can be treated with the use of antibiotics. The choice of antibiotic and length of treatment depend on your symptoms and the type of bacteria causing your infection.  HOME CARE INSTRUCTIONS   If you were prescribed antibiotics, take them exactly as your caregiver instructs you. Finish the medication even if you feel better after you  have only taken some of the medication.   Drink enough water and fluids to keep your urine clear or pale yellow.   Avoid caffeine, tea, and carbonated beverages. They tend to irritate your bladder.   Empty your bladder often. Avoid holding urine for long periods of time.   Empty your bladder before and after sexual intercourse.   After a bowel movement, women should cleanse from front to back. Use each tissue only once.  SEEK MEDICAL CARE IF:    You have back pain.   You develop a fever.   Your symptoms do not begin to resolve within 3 days.  SEEK IMMEDIATE MEDICAL CARE IF:    You have severe back pain or lower abdominal pain.   You develop chills.   You have nausea or vomiting.   You have continued burning or discomfort with urination.  MAKE SURE YOU:    Understand these instructions.   Will watch your condition.   Will get help right away if you are not doing well or get worse.  Document Released: 08/01/2005 Document Revised: 04/22/2012 Document Reviewed: 11/30/2011  [employer] Patient Information 2014 [employer]

## 2013-09-06 ENCOUNTER — Emergency Department (HOSPITAL_COMMUNITY): Payer: Medicare Other

## 2013-09-06 ENCOUNTER — Encounter (HOSPITAL_COMMUNITY): Payer: Self-pay | Admitting: Emergency Medicine

## 2013-09-06 ENCOUNTER — Emergency Department (HOSPITAL_COMMUNITY)
Admission: EM | Admit: 2013-09-06 | Discharge: 2013-09-06 | Disposition: A | Discharge status: Home / Self Care | Payer: Medicare Other | Attending: Emergency Medicine | Admitting: Emergency Medicine

## 2013-09-06 DIAGNOSIS — F329 Major depressive disorder, single episode, unspecified: Secondary | ICD-10-CM | POA: Insufficient documentation

## 2013-09-06 DIAGNOSIS — N390 Urinary tract infection, site not specified: Secondary | ICD-10-CM | POA: Insufficient documentation

## 2013-09-06 DIAGNOSIS — Z792 Long term (current) use of antibiotics: Secondary | ICD-10-CM | POA: Insufficient documentation

## 2013-09-06 DIAGNOSIS — F411 Generalized anxiety disorder: Secondary | ICD-10-CM | POA: Insufficient documentation

## 2013-09-06 DIAGNOSIS — R630 Anorexia: Secondary | ICD-10-CM | POA: Insufficient documentation

## 2013-09-06 DIAGNOSIS — R6883 Chills (without fever): Secondary | ICD-10-CM | POA: Insufficient documentation

## 2013-09-06 DIAGNOSIS — F3289 Other specified depressive episodes: Secondary | ICD-10-CM | POA: Insufficient documentation

## 2013-09-06 DIAGNOSIS — Z880 Allergy status to penicillin: Secondary | ICD-10-CM | POA: Insufficient documentation

## 2013-09-06 DIAGNOSIS — Z794 Long term (current) use of insulin: Secondary | ICD-10-CM | POA: Insufficient documentation

## 2013-09-06 DIAGNOSIS — IMO0002 Reserved for concepts with insufficient information to code with codable children: Secondary | ICD-10-CM | POA: Insufficient documentation

## 2013-09-06 DIAGNOSIS — Z90710 Acquired absence of both cervix and uterus: Secondary | ICD-10-CM | POA: Insufficient documentation

## 2013-09-06 DIAGNOSIS — J449 Chronic obstructive pulmonary disease, unspecified: Secondary | ICD-10-CM | POA: Insufficient documentation

## 2013-09-06 DIAGNOSIS — E119 Type 2 diabetes mellitus without complications: Secondary | ICD-10-CM | POA: Insufficient documentation

## 2013-09-06 DIAGNOSIS — R109 Unspecified abdominal pain: Secondary | ICD-10-CM | POA: Insufficient documentation

## 2013-09-06 DIAGNOSIS — M549 Dorsalgia, unspecified: Secondary | ICD-10-CM | POA: Insufficient documentation

## 2013-09-06 DIAGNOSIS — N760 Acute vaginitis: Secondary | ICD-10-CM | POA: Insufficient documentation

## 2013-09-06 DIAGNOSIS — B9689 Other specified bacterial agents as the cause of diseases classified elsewhere: Secondary | ICD-10-CM

## 2013-09-06 DIAGNOSIS — E785 Hyperlipidemia, unspecified: Secondary | ICD-10-CM | POA: Insufficient documentation

## 2013-09-06 DIAGNOSIS — I10 Essential (primary) hypertension: Secondary | ICD-10-CM | POA: Insufficient documentation

## 2013-09-06 DIAGNOSIS — Z7982 Long term (current) use of aspirin: Secondary | ICD-10-CM | POA: Insufficient documentation

## 2013-09-06 DIAGNOSIS — R110 Nausea: Secondary | ICD-10-CM | POA: Insufficient documentation

## 2013-09-06 DIAGNOSIS — J4489 Other specified chronic obstructive pulmonary disease: Secondary | ICD-10-CM | POA: Insufficient documentation

## 2013-09-06 DIAGNOSIS — Z79899 Other long term (current) drug therapy: Secondary | ICD-10-CM | POA: Insufficient documentation

## 2013-09-06 LAB — CBC WITH DIFFERENTIAL/PLATELET
Eosinophils Relative: 1 % (ref 0–5)
HCT: 43 % (ref 36.0–46.0)
Lymphocytes Relative: 30 % (ref 12–46)
Lymphs Abs: 2.4 10*3/uL (ref 0.7–4.0)
MCV: 90.1 fL (ref 78.0–100.0)
Monocytes Absolute: 0.5 10*3/uL (ref 0.1–1.0)
Monocytes Relative: 6 % (ref 3–12)
Neutro Abs: 5 10*3/uL (ref 1.7–7.7)
RBC: 4.77 MIL/uL (ref 3.87–5.11)
RDW: 12.8 % (ref 11.5–15.5)
WBC: 8 10*3/uL (ref 4.0–10.5)

## 2013-09-06 LAB — URINALYSIS, ROUTINE W REFLEX MICROSCOPIC
Glucose, UA: NEGATIVE mg/dL
Hgb urine dipstick: NEGATIVE
Protein, ur: NEGATIVE mg/dL
Specific Gravity, Urine: >1.03 — ABNORMAL HIGH (ref 1.005–1.030)

## 2013-09-06 LAB — BASIC METABOLIC PANEL
BUN: 20 mg/dL (ref 6–23)
CO2: 25 mEq/L (ref 19–32)
Calcium: 9.8 mg/dL (ref 8.4–10.5)
Creatinine, Ser: 0.87 mg/dL (ref 0.50–1.10)
Glucose, Bld: 104 mg/dL — ABNORMAL HIGH (ref 70–99)

## 2013-09-06 LAB — WET PREP, GENITAL: Yeast Wet Prep HPF POC: NONE SEEN

## 2013-09-06 MED ORDER — OXYCODONE-ACETAMINOPHEN 5-325 MG PO TABS
1.0000 | ORAL_TABLET | Freq: Once | ORAL | Status: AC
  Administered 2013-09-06: 1 via ORAL
  Filled 2013-09-06: qty 1

## 2013-09-06 MED ORDER — OXYCODONE-ACETAMINOPHEN 5-325 MG PO TABS: 1.0000 | ORAL_TABLET | Freq: Four times a day (QID) | ORAL | Status: DC | PRN

## 2013-09-06 MED ORDER — IOHEXOL 300 MG/ML  SOLN
50.0000 mL | Freq: Once | INTRAMUSCULAR | Status: AC | PRN
  Administered 2013-09-06: 50 mL via ORAL

## 2013-09-06 MED ORDER — METRONIDAZOLE 500 MG PO TABS: 500.0000 mg | ORAL_TABLET | Freq: Two times a day (BID) | ORAL | Status: DC

## 2013-09-06 MED ORDER — IOHEXOL 300 MG/ML  SOLN
100.0000 mL | Freq: Once | INTRAMUSCULAR | Status: AC | PRN
  Administered 2013-09-06: 100 mL via INTRAVENOUS

## 2013-09-06 NOTE — ED Notes (Signed)
Dr pickering at bedside  

## 2013-09-06 NOTE — ED Notes (Signed)
Pt c/o uti symptoms that started Monday, had tried treating herself at home with azo and cranberry juice with no improvement, was seen by pcp on Wednesday, placed on Cipro, pt states that she is not getting any better, c/o lower abd and lower back pain.

## 2013-09-06 NOTE — ED Provider Notes (Signed)
CSN: [id number]     Arrival date & time 09/06/13  1219 History  This chart was scribed for [employer]. Rubin Payor, MD by Valera Castle, ED Scribe. This patient was seen in room APA01/APA01 and the patient's care was started at 2:48 PM.    Chief Complaint  Patient presents with  . Urinary Tract Infection   The history is provided by the patient. No language interpreter was used.   HPI Comments: Diana Pratt is a 67 y.o. female who presents to the Emergency Department complaining of sudden, moderate, constant abdomen and back pain, onset 1 week ago. She reports associated pain in her vagina, and that it feels like something is pulling. She also reports associated urinary frequency, burning with urination, mid back pain, decreased appetite, nausea, and chills. She states she has taken AzoYeast and cranberry juice, with no relief. She states she was seen by her PCP 5 days ago, and was placed on Cipro, with no relief. She reports an allergy to Sulfer, Morphine, and Penicillin. She denies fever, diarrhea, vaginal discharge, vaginal bleeding, rashes, and any other associated symptoms. She reports h/o DM.    Past Medical History  Diagnosis Date  . Hypertension   . COPD (chronic obstructive pulmonary disease)   . Diabetes mellitus   . Anxiety   . Hyperlipemia   . Asthma   . Depression    Past Surgical History  Procedure Laterality Date  . Abdominal hysterectomy  03/1984    partial   Family History  Problem Relation Age of Onset  . Parkinsonism Mother    History  Substance Use Topics  . Smoking status: Former Smoker    Quit date: 06/01/1994  . Smokeless tobacco: Never Used  . Alcohol Use: Yes     Comment: once a month   OB History   Grav Para Term Preterm Abortions TAB SAB Ect Mult Living   2 1 1  1     1      Review of Systems  Constitutional: Positive for chills and appetite change (decreased). Negative for fever.  Gastrointestinal: Positive for nausea and abdominal pain.  Negative for diarrhea.  Genitourinary: Positive for dysuria (burning sensation) and frequency. Negative for vaginal bleeding and vaginal discharge.       Positive for vaginal pain.  Musculoskeletal: Positive for back pain (mid).  Skin: Negative for rash.  All other systems reviewed and are negative.    Allergies  Lipitor; Morphine and related; Penicillins; Simvastatin; Sulfa antibiotics; and Zetia  Home Medications   Current Outpatient Rx  Name  Route  Sig  Dispense  Refill  . acetaminophen (TYLENOL) 325 MG tablet   Oral   Take 650 mg by mouth every 6 (six) hours as needed. pain         . albuterol (PROAIR HFA) 108 (90 BASE) MCG/ACT inhaler   Inhalation   Inhale 2 puffs into the lungs every 6 (six) hours as needed.   8.5 g   0   . albuterol (PROVENTIL) (2.5 MG/3ML) 0.083% nebulizer solution   Nebulization   Take 2.5 mg by nebulization every 6 (six) hours as needed. Shortness of breath          . aspirin 81 MG EC tablet   Oral   Take 81 mg by mouth daily.           . calcium carbonate 200 MG capsule   Oral   Take 250 mg by mouth daily.          Marland Kitchen  Cholecalciferol (VITAMIN D) 2000 UNITS CAPS   Oral   Take 2,000 Units by mouth daily.          . ciprofloxacin (CIPRO) 500 MG tablet   Oral   Take 1 tablet (500 mg total) by mouth 2 (two) times daily.   14 tablet   0   . escitalopram (LEXAPRO) 10 MG tablet   Oral   Take 1 tablet (10 mg total) by mouth daily.   30 tablet   5   . EXFORGE 5-320 MG per tablet   Oral   Take 1 tablet by mouth daily.          . fenofibrate (TRICOR) 145 MG tablet   Oral   Take 145 mg by mouth every Monday, Wednesday, and Friday.         . fluticasone (FLONASE) 50 MCG/ACT nasal spray   Nasal   Place 2 sprays into the nose daily as needed. Congestion          . glipiZIDE (GLUCOTROL) 5 MG tablet   Oral   Take 5 mg by mouth daily.          . hydrochlorothiazide (HYDRODIURIL) 25 MG tablet   Oral   Take 25 mg by  mouth daily.          . hydroxypropyl methylcellulose (ISOPTO TEARS) 2.5 % ophthalmic solution   Both Eyes   Place 1 drop into both eyes daily as needed. for dry eyes          . Insulin Glargine (LANTUS SOLOSTAR) 100 UNIT/ML SOPN   Subcutaneous   Inject 29 Units into the skin daily at 10 pm.   12 mL   11   . LORazepam (ATIVAN) 1 MG tablet   Oral   Take 1 mg by mouth 2 (two) times daily as needed for anxiety.         . metFORMIN (GLUCOPHAGE) 1000 MG tablet   Oral   Take 250 mg by mouth daily with breakfast. 1/4 tablet daily         . montelukast (SINGULAIR) 10 MG tablet   Oral   Take 10 mg by mouth at bedtime.          . Multiple Vitamins-Minerals (CENTRUM SILVER PO)   Oral   Take 1 tablet by mouth daily.          . naproxen sodium (ALEVE) 220 MG tablet   Oral   Take 660 mg by mouth daily as needed (for pain).         Marland Kitchen sitaGLIPtin (JANUVIA) 100 MG tablet   Oral   Take 100 mg by mouth daily.         . metroNIDAZOLE (FLAGYL) 500 MG tablet   Oral   Take 1 tablet (500 mg total) by mouth 2 (two) times daily.   14 tablet   0   . oxyCODONE-acetaminophen (PERCOCET/ROXICET) 5-325 MG per tablet   Oral   Take 1-2 tablets by mouth every 6 (six) hours as needed for pain.   10 tablet   0    BP 94/70  Pulse 61  Temp(Src) 98.2 F (36.8 C) (Oral)  Resp 18  SpO2 98%  Physical Exam  Nursing note and vitals reviewed. Constitutional: She is oriented to person, place, and time. She appears well-developed and well-nourished. No distress.  HENT:  Head: Normocephalic and atraumatic.  Eyes: EOM are normal.  Cardiovascular: Normal rate.   Pulmonary/Chest: Effort normal. No respiratory distress. She exhibits  no tenderness.  Abdominal: Soft. There is tenderness (supra pubic). There is no rebound and no guarding.  Genitourinary:  No hernia.  Neurological: She is alert and oriented to person, place, and time.  Skin: Skin is warm and dry.  Psychiatric: She has a  normal mood and affect. Her behavior is normal.   pelvic exam showed some white vaginal discharge. Mild diffuse vaginal tenderness. Mild diffuse pelvic tenderness.  ED Course  Procedures (including critical care time)  COORDINATION OF CARE: 2:54 PM-Discussed treatment plan which includes UA, CBC, and BMP with pt at bedside and pt agreed to plan.   Labs Review Labs Reviewed  WET PREP, GENITAL - Abnormal; Notable for the following:    Clue Cells Wet Prep HPF POC FEW (*)    WBC, Wet Prep HPF POC FEW (*)    All other components within normal limits  URINALYSIS, ROUTINE W REFLEX MICROSCOPIC - Abnormal; Notable for the following:    APPearance HAZY (*)    Specific Gravity, Urine >1.030 (*)    All other components within normal limits  CBC WITH DIFFERENTIAL - Abnormal; Notable for the following:    Hemoglobin 15.1 (*)    All other components within normal limits  BASIC METABOLIC PANEL - Abnormal; Notable for the following:    Glucose, Bld 104 (*)    GFR calc non Af Amer 67 (*)    GFR calc Af Amer 78 (*)    All other components within normal limits  GC/CHLAMYDIA PROBE AMP   Imaging Review Ct Abdomen Pelvis W Contrast  09/06/2013   CLINICAL DATA:  Lower abdominal pain.  Previous hysterectomy.  EXAM: CT ABDOMEN AND PELVIS WITH CONTRAST  TECHNIQUE: Multidetector CT imaging of the abdomen and pelvis was performed using the standard protocol following bolus administration of intravenous contrast.  CONTRAST:  OMNIPAQUE IOHEXOL 300 MG/ML  SOLN  COMPARISON:  06/02/2011  FINDINGS: The liver, gallbladder, pancreas, spleen, adrenal glands, and kidneys are normal in appearance. No evidence of hydronephrosis. Prior hysterectomy noted. Adnexal regions are unremarkable in appearance.  No soft tissue masses or lymphadenopathy identified within the abdomen or pelvis. No evidence of inflammatory process or abnormal fluid collections. Normal appendix is visualized. No evidence of bowel wall thickening,  dilatation, or hernia.  IMPRESSION: Negative. No acute findings or other significant abnormality identified.   Electronically Signed   By: Myles Rosenthal M.D.   On: 09/06/2013 18:19    EKG Interpretation   None       MDM   1. Abdominal pain   2. Bacterial vaginosis    Patient with lower abdominal/pelvic pain. Has been treated for urinary tract infection. Labs reviewed and it did show a urinary tract infection at the doctor's office. Urine here does not show infection. CT scan was done without acute findings. We'll treat for bacterial vaginosis. We'll also get pain medicines. Urine culture is sent. Will not change antibiotics since urine has improved.     I personally performed the services described in this documentation, which was scribed in my presence. The recorded information has been reviewed and is accurate.     Juliet Rude. Rubin Payor, MD 09/06/13 1929

## 2013-09-07 LAB — GC/CHLAMYDIA PROBE AMP: GC Probe RNA: NEGATIVE

## 2013-09-10 ENCOUNTER — Other Ambulatory Visit: Payer: Self-pay

## 2013-09-11 ENCOUNTER — Ambulatory Visit (INDEPENDENT_AMBULATORY_CARE_PROVIDER_SITE_OTHER): Payer: Medicare Other | Admitting: Family Medicine

## 2013-09-11 ENCOUNTER — Other Ambulatory Visit: Payer: Self-pay | Admitting: Family Medicine

## 2013-09-11 ENCOUNTER — Encounter: Payer: Self-pay | Admitting: Family Medicine

## 2013-09-11 VITALS — BP 158/72 | HR 73 | Temp 98.0°F | Ht 63.0 in | Wt 195.2 lb

## 2013-09-11 DIAGNOSIS — E785 Hyperlipidemia, unspecified: Secondary | ICD-10-CM

## 2013-09-11 DIAGNOSIS — E559 Vitamin D deficiency, unspecified: Secondary | ICD-10-CM

## 2013-09-11 DIAGNOSIS — F418 Other specified anxiety disorders: Secondary | ICD-10-CM

## 2013-09-11 DIAGNOSIS — N3010 Interstitial cystitis (chronic) without hematuria: Secondary | ICD-10-CM

## 2013-09-11 DIAGNOSIS — N760 Acute vaginitis: Secondary | ICD-10-CM

## 2013-09-11 DIAGNOSIS — E119 Type 2 diabetes mellitus without complications: Secondary | ICD-10-CM

## 2013-09-11 DIAGNOSIS — I10 Essential (primary) hypertension: Secondary | ICD-10-CM

## 2013-09-11 DIAGNOSIS — F341 Dysthymic disorder: Secondary | ICD-10-CM

## 2013-09-11 LAB — POCT GLYCOSYLATED HEMOGLOBIN (HGB A1C): Hemoglobin A1C: 5.4

## 2013-09-11 MED ORDER — LORAZEPAM 1 MG PO TABS: 1.0000 mg | ORAL_TABLET | Freq: Two times a day (BID) | ORAL | Status: DC | PRN

## 2013-09-11 MED ORDER — METFORMIN HCL 1000 MG PO TABS: 250.0000 mg | ORAL_TABLET | Freq: Every day | ORAL | Status: DC

## 2013-09-11 MED ORDER — LANCETS ULTRA THIN MISC: 1.0000 | Freq: Every day | Status: DC

## 2013-09-11 MED ORDER — AMLODIPINE BESYLATE-VALSARTAN 5-320 MG PO TABS: 1.0000 | ORAL_TABLET | Freq: Every day | ORAL | Status: DC

## 2013-09-11 MED ORDER — METFORMIN HCL 500 MG PO TABS: 250.0000 mg | ORAL_TABLET | Freq: Every day | ORAL | Status: DC

## 2013-09-11 MED ORDER — FENOFIBRATE 145 MG PO TABS: 145.0000 mg | ORAL_TABLET | ORAL | Status: DC

## 2013-09-11 MED ORDER — FLUCONAZOLE 150 MG PO TABS: 150.0000 mg | ORAL_TABLET | Freq: Once | ORAL | Status: DC

## 2013-09-11 MED ORDER — GLIPIZIDE 5 MG PO TABS: 5.0000 mg | ORAL_TABLET | Freq: Every day | ORAL | Status: DC

## 2013-09-11 MED ORDER — HYDROCHLOROTHIAZIDE 25 MG PO TABS: 25.0000 mg | ORAL_TABLET | Freq: Every day | ORAL | Status: DC

## 2013-09-11 NOTE — Patient Instructions (Signed)
    Dr Tip Atienza's Recommendations  For nutrition information, I recommend books:  1).Eat to Live by Dr Joel Fuhrman. 2).Prevent and Reverse Heart Disease by Dr Caldwell Esselstyn. 3) Dr Neal Barnard's Book:  Program to Reverse Diabetes  Exercise recommendations are:  If unable to walk, then the patient can exercise in a chair 3 times a day. By flapping arms like a bird gently and raising legs outwards to the front.  If ambulatory, the patient can go for walks for 30 minutes 3 times a week. Then increase the intensity and duration as tolerated.  Goal is to try to attain exercise frequency to 5 times a week.  If applicable: Best to perform resistance exercises (machines or weights) 2 days a week and cardio type exercises 3 days per week.   Diabetes and Foot Care Diabetes may cause you to have problems because of poor blood supply (circulation) to your feet and legs. This may cause the skin on your feet to become thinner, break easier, and heal more slowly. Your skin may become dry, and the skin may peel and crack. You may also have nerve damage in your legs and feet causing decreased feeling in them. You may not notice minor injuries to your feet that could lead to infections or more serious problems. Taking care of your feet is one of the most important things you can do for yourself.  HOME CARE INSTRUCTIONS  Wear shoes at all times, even in the house. Do not go barefoot. Bare feet are easily injured.  Check your feet daily for blisters, cuts, and redness. If you cannot see the bottom of your feet, use a mirror or ask someone for help.  Wash your feet with warm water (do not use hot water) and mild soap. Then pat your feet and the areas between your toes until they are completely dry. Do not soak your feet as this can dry your skin.  Apply a moisturizing lotion or petroleum jelly (that does not contain alcohol and is unscented) to the skin on your feet and to dry, brittle toenails.  Do not apply lotion between your toes.  Trim your toenails straight across. Do not dig under them or around the cuticle. File the edges of your nails with an emery board or nail file.  Do not cut corns or calluses or try to remove them with medicine.  Wear clean socks or stockings every day. Make sure they are not too tight. Do not wear knee-high stockings since they may decrease blood flow to your legs.  Wear shoes that fit properly and have enough cushioning. To break in new shoes, wear them for just a few hours a day. This prevents you from injuring your feet. Always look in your shoes before you put them on to be sure there are no objects inside.  Do not cross your legs. This may decrease the blood flow to your feet.  If you find a minor scrape, cut, or break in the skin on your feet, keep it and the skin around it clean and dry. These areas may be cleansed with mild soap and water. Do not cleanse the area with peroxide, alcohol, or iodine.  When you remove an adhesive bandage, be sure not to damage the skin around it.  If you have a wound, look at it several times a day to make sure it is healing.  Do not use heating pads or hot water bottles. They may burn your skin. If   you have lost feeling in your feet or legs, you may not know it is happening until it is too late.  Make sure your health care provider performs a complete foot exam at least annually or more often if you have foot problems. Report any cuts, sores, or bruises to your health care provider immediately. SEEK MEDICAL CARE IF:   You have an injury that is not healing.  You have cuts or breaks in the skin.  You have an ingrown nail.  You notice redness on your legs or feet.  You feel burning or tingling in your legs or feet.  You have pain or cramps in your legs and feet.  Your legs or feet are numb.  Your feet always feel cold. SEEK IMMEDIATE MEDICAL CARE IF:   There is increasing redness, swelling, or pain in  or around a wound.  There is a red line that goes up your leg.  Pus is coming from a wound.  You develop a fever or as directed by your health care provider.  You notice a bad smell coming from an ulcer or wound. Document Released: 10/19/2000 Document Revised: 06/24/2013 Document Reviewed: 03/31/2013 [employer] Patient Information 2014 [employer]  

## 2013-09-11 NOTE — Progress Notes (Signed)
Patient ID: Diana Pratt, female   DOB: 24-Dec-1945, 67 y.o.   MRN: [id number] SUBJECTIVE: CC: Chief Complaint  Patient presents with  . Follow-up    DIABETES  was seen @ [facility]  had pelvic and CT scan was told also had bacterial vaginiitsl   . Medication Refill    exforge and hctz and metformin lorazepam  and glipizide completed antibiotic for UTI  c/o dont think UTI cleard up and stated has seen urologist in past and was told  she had intercitial cystitis.     HPI: Has been getting up with frequency of urine. Was told UTI here then ED told had bacterial vaginosis. Has a yeast infection from all the antibiotics. Used to be treated by Dr Vonita Moss for interstitial cystitis. Has tingling discomfort in the pelvis.especially when she urinates.  Patient is here for follow up of Diabetes Mellitus/HLD/HTN: Symptoms evaluated: Denies Nocturia ,Denies Urinary Frequency , denies Blurred vision ,deniesDizziness,denies.Dysuria,denies paresthesias, denies extremity pain or ulcers.Marland Kitchendenies chest pain. has had an annual eye exam. do check the feet. Does check CBGs. Average CBG:110-120s Denies episodes of hypoglycemia. Does have an emergency hypoglycemic plan. admits toCompliance with medications. Problems with medications. Yeast vaginitis  Past Medical History  Diagnosis Date  . Hypertension   . COPD (chronic obstructive pulmonary disease)   . Diabetes mellitus   . Anxiety   . Hyperlipemia   . Asthma   . Depression   . Interstitial cystitis    Past Surgical History  Procedure Laterality Date  . Abdominal hysterectomy  03/1984    partial   History   Social History  . Marital Status: Married    Spouse Name: N/A    Number of Children: N/A  . Years of Education: N/A   Occupational History  . Not on file.   Social History Main Topics  . Smoking status: Former Smoker    Quit date: 06/01/1994  . Smokeless tobacco: Never Used  . Alcohol Use: Yes     Comment: once a month  . Drug Use:  No  . Sexual Activity: No   Other Topics Concern  . Not on file   Social History Narrative  . No narrative on file   Family History  Problem Relation Age of Onset  . Parkinsonism Mother    Current Outpatient Prescriptions on File Prior to Visit  Medication Sig Dispense Refill  . acetaminophen (TYLENOL) 325 MG tablet Take 650 mg by mouth every 6 (six) hours as needed. pain      . albuterol (PROAIR HFA) 108 (90 BASE) MCG/ACT inhaler Inhale 2 puffs into the lungs every 6 (six) hours as needed.  8.5 g  0  . albuterol (PROVENTIL) (2.5 MG/3ML) 0.083% nebulizer solution Take 2.5 mg by nebulization every 6 (six) hours as needed. Shortness of breath       . aspirin 81 MG EC tablet Take 81 mg by mouth daily.        . calcium carbonate 200 MG capsule Take 250 mg by mouth daily.       . Cholecalciferol (VITAMIN D) 2000 UNITS CAPS Take 2,000 Units by mouth daily.       Marland Kitchen escitalopram (LEXAPRO) 10 MG tablet Take 1 tablet (10 mg total) by mouth daily.  30 tablet  5  . fluticasone (FLONASE) 50 MCG/ACT nasal spray Place 2 sprays into the nose daily as needed. Congestion       . hydroxypropyl methylcellulose (ISOPTO TEARS) 2.5 % ophthalmic solution Place 1 drop into  both eyes daily as needed. for dry eyes       . Insulin Glargine (LANTUS SOLOSTAR) 100 UNIT/ML SOPN Inject 29 Units into the skin daily at 10 pm.  12 mL  11  . sitaGLIPtin (JANUVIA) 100 MG tablet Take 100 mg by mouth daily.      . montelukast (SINGULAIR) 10 MG tablet Take 10 mg by mouth at bedtime.       . Multiple Vitamins-Minerals (CENTRUM SILVER PO) Take 1 tablet by mouth daily.       . naproxen sodium (ALEVE) 220 MG tablet Take 660 mg by mouth daily as needed (for pain).       No current facility-administered medications on file prior to visit.   Allergies  Allergen Reactions  . Lipitor [Atorvastatin]     Myalgias   . Morphine And Related Other (See Comments)    Burning, itching  . Penicillins   . Simvastatin Other (See  Comments)    myalgias  . Sulfa Antibiotics   . Zetia [Ezetimibe] Cough   Immunization History  Administered Date(s) Administered  . DTaP 01/04/2003   Prior to Admission medications   Medication Sig Start Date End Date Taking? Authorizing Provider  acetaminophen (TYLENOL) 325 MG tablet Take 650 mg by mouth every 6 (six) hours as needed. pain   Yes Historical Provider, MD  albuterol (PROAIR HFA) 108 (90 BASE) MCG/ACT inhaler Inhale 2 puffs into the lungs every 6 (six) hours as needed. 01/27/13  Yes Ileana Ladd, MD  albuterol (PROVENTIL) (2.5 MG/3ML) 0.083% nebulizer solution Take 2.5 mg by nebulization every 6 (six) hours as needed. Shortness of breath    Yes Historical Provider, MD  aspirin 81 MG EC tablet Take 81 mg by mouth daily.     Yes Historical Provider, MD  calcium carbonate 200 MG capsule Take 250 mg by mouth daily.    Yes Historical Provider, MD  Cholecalciferol (VITAMIN D) 2000 UNITS CAPS Take 2,000 Units by mouth daily.    Yes Historical Provider, MD  escitalopram (LEXAPRO) 10 MG tablet Take 1 tablet (10 mg total) by mouth daily. 02/27/13  Yes Ileana Ladd, MD  EXFORGE 5-320 MG per tablet Take 1 tablet by mouth daily.  01/20/13  Yes Historical Provider, MD  fenofibrate (TRICOR) 145 MG tablet Take 145 mg by mouth every Monday, Wednesday, and Friday.   Yes Historical Provider, MD  fluticasone (FLONASE) 50 MCG/ACT nasal spray Place 2 sprays into the nose daily as needed. Congestion    Yes Historical Provider, MD  glipiZIDE (GLUCOTROL) 5 MG tablet Take 5 mg by mouth daily.    Yes Historical Provider, MD  glucose blood test strip  06/17/13  Yes Historical Provider, MD  hydrochlorothiazide (HYDRODIURIL) 25 MG tablet Take 25 mg by mouth daily.  01/20/13  Yes Historical Provider, MD  hydroxypropyl methylcellulose (ISOPTO TEARS) 2.5 % ophthalmic solution Place 1 drop into both eyes daily as needed. for dry eyes    Yes Historical Provider, MD  Insulin Glargine (LANTUS SOLOSTAR) 100 UNIT/ML  SOPN Inject 29 Units into the skin daily at 10 pm. 05/29/13  Yes Ileana Ladd, MD  LANCETS ULTRA THIN MISC  06/18/13  Yes Historical Provider, MD  LORazepam (ATIVAN) 1 MG tablet Take 1 mg by mouth 2 (two) times daily as needed for anxiety.   Yes Historical Provider, MD  metFORMIN (GLUCOPHAGE) 1000 MG tablet Take 250 mg by mouth daily with breakfast. 1/4 tablet daily   Yes Historical Provider, MD  sitaGLIPtin (  JANUVIA) 100 MG tablet Take 100 mg by mouth daily.   Yes Historical Provider, MD  montelukast (SINGULAIR) 10 MG tablet Take 10 mg by mouth at bedtime.  12/26/12   Historical Provider, MD  Multiple Vitamins-Minerals (CENTRUM SILVER PO) Take 1 tablet by mouth daily.     Historical Provider, MD  naproxen sodium (ALEVE) 220 MG tablet Take 660 mg by mouth daily as needed (for pain).    Historical Provider, MD     ROS: As above in the HPI. All other systems are stable or negative.  OBJECTIVE: APPEARANCE:  Patient in no acute distress.The patient appeared well nourished and normally developed. Acyanotic. Waist: VITAL SIGNS:BP 158/72  Pulse 73  Temp(Src) 98 F (36.7 C) (Oral)  Ht 5\' 3"  (1.6 m)  Wt 195 lb 3.2 oz (88.542 kg)  BMI 34.59 kg/m2 Obese WF  SKIN: warm and  Dry without overt rashes, tattoos and scars  HEAD and Neck: without JVD, Head and scalp: normal Eyes:No scleral icterus. Fundi normal, eye movements normal. Ears: Auricle normal, canal normal, Tympanic membranes normal, insufflation normal. Nose: normal Throat: normal Neck & thyroid: normal  CHEST & LUNGS: Chest wall: normal Lungs: Clear  CVS: Reveals the PMI to be normally located. Regular rhythm, First and Second Heart sounds are normal,  absence of murmurs, rubs or gallops. Peripheral vasculature: Radial pulses: normal Dorsal pedis pulses: normal Posterior pulses: normal  ABDOMEN:  Appearance: obese Benign, no organomegaly, no masses, no Abdominal Aortic enlargement. No Guarding , no rebound. No  Bruits.mild tenderness in the suprapubic area Bowel sounds: normal  RECTAL: N/A GU: N/A  EXTREMETIES: nonedematous.  MUSCULOSKELETAL:  Spine: normal Joints: intact  NEUROLOGIC: oriented to time,place and person; nonfocal. Strength is normal Sensory is normal Reflexes are normal Cranial Nerves are normal.  Results for orders placed during the hospital encounter of 09/06/13  GC/CHLAMYDIA PROBE AMP      Result Value Range   CT Probe RNA NEGATIVE  NEGATIVE   GC Probe RNA NEGATIVE  NEGATIVE  WET PREP, GENITAL      Result Value Range   Yeast Wet Prep HPF POC NONE SEEN  NONE SEEN   Trich, Wet Prep NONE SEEN  NONE SEEN   Clue Cells Wet Prep HPF POC FEW (*) NONE SEEN   WBC, Wet Prep HPF POC FEW (*) NONE SEEN  URINALYSIS, ROUTINE W REFLEX MICROSCOPIC      Result Value Range   Color, Urine YELLOW  YELLOW   APPearance HAZY (*) CLEAR   Specific Gravity, Urine >1.030 (*) 1.005 - 1.030   pH 5.0  5.0 - 8.0   Glucose, UA NEGATIVE  NEGATIVE mg/dL   Hgb urine dipstick NEGATIVE  NEGATIVE   Bilirubin Urine NEGATIVE  NEGATIVE   Ketones, ur NEGATIVE  NEGATIVE mg/dL   Protein, ur NEGATIVE  NEGATIVE mg/dL   Urobilinogen, UA 0.2  0.0 - 1.0 mg/dL   Nitrite NEGATIVE  NEGATIVE   Leukocytes, UA NEGATIVE  NEGATIVE  CBC WITH DIFFERENTIAL      Result Value Range   WBC 8.0  4.0 - 10.5 K/uL   RBC 4.77  3.87 - 5.11 MIL/uL   Hemoglobin 15.1 (*) 12.0 - 15.0 g/dL   HCT 16.1  09.6 - 04.5 %   MCV 90.1  78.0 - 100.0 fL   MCH 31.7  26.0 - 34.0 pg   MCHC 35.1  30.0 - 36.0 g/dL   RDW 40.9  81.1 - 91.4 %   Platelets 224  150 -  400 K/uL   Neutrophils Relative % 63  43 - 77 %   Neutro Abs 5.0  1.7 - 7.7 K/uL   Lymphocytes Relative 30  12 - 46 %   Lymphs Abs 2.4  0.7 - 4.0 K/uL   Monocytes Relative 6  3 - 12 %   Monocytes Absolute 0.5  0.1 - 1.0 K/uL   Eosinophils Relative 1  0 - 5 %   Eosinophils Absolute 0.1  0.0 - 0.7 K/uL   Basophils Relative 0  0 - 1 %   Basophils Absolute 0.0  0.0 - 0.1 K/uL   BASIC METABOLIC PANEL      Result Value Range   Sodium 138  135 - 145 mEq/L   Potassium 3.6  3.5 - 5.1 mEq/L   Chloride 100  96 - 112 mEq/L   CO2 25  19 - 32 mEq/L   Glucose, Bld 104 (*) 70 - 99 mg/dL   BUN 20  6 - 23 mg/dL   Creatinine, Ser 0.98  0.50 - 1.10 mg/dL   Calcium 9.8  8.4 - 11.9 mg/dL   GFR calc non Af Amer 67 (*) >90 mL/min   GFR calc Af Amer 78 (*) >90 mL/min    ASSESSMENT: HTN (hypertension) - Plan: amLODipine-valsartan (EXFORGE) 5-320 MG per tablet, hydrochlorothiazide (HYDRODIURIL) 25 MG tablet  Hyperlipemia - Plan: Hepatic function panel, NMR, lipoprofile, fenofibrate (TRICOR) 145 MG tablet  Type 2 diabetes mellitus - Plan: POCT glycosylated hemoglobin (Hb A1C), glipiZIDE (GLUCOTROL) 5 MG tablet, LANCETS ULTRA THIN MISC, metFORMIN (GLUCOPHAGE) 500 MG tablet, DISCONTINUED: metFORMIN (GLUCOPHAGE) 1000 MG tablet  Unspecified vitamin D deficiency - Plan: Vit D  25 hydroxy (rtn osteoporosis monitoring)  Depression with anxiety - Plan: LORazepam (ATIVAN) 1 MG tablet  Interstitial cystitis - Plan: Ambulatory referral to Urology  Vaginitis and vulvovaginitis - Plan: fluconazole (DIFLUCAN) 150 MG tablet  PLAN:      Dr Woodroe Mode Recommendations  For nutrition information, I recommend books:  1).Eat to Live by Dr Monico Hoar. 2).Prevent and Reverse Heart Disease by Dr Suzzette Righter. 3) Dr Katherina Right Book:  Program to Reverse Diabetes  Exercise recommendations are:  If unable to walk, then the patient can exercise in a chair 3 times a day. By flapping arms like a bird gently and raising legs outwards to the front.  If ambulatory, the patient can go for walks for 30 minutes 3 times a week. Then increase the intensity and duration as tolerated.  Goal is to try to attain exercise frequency to 5 times a week.  If applicable: Best to perform resistance exercises (machines or weights) 2 days a week and cardio type exercises 3 days per week.  footcare  handout in the AVS Orders Placed This Encounter  Procedures  . Hepatic function panel  . NMR, lipoprofile  . Vit D  25 hydroxy (rtn osteoporosis monitoring)  . Ambulatory referral to Urology    Referral Priority:  Routine    Referral Type:  Consultation    Referral Reason:  Specialty Services Required    Requested Specialty:  Urology    Number of Visits Requested:  1  . POCT glycosylated hemoglobin (Hb A1C)   Meds ordered this encounter  Medications  . DISCONTD: LANCETS ULTRA THIN MISC    Sig:   . glucose blood test strip    Sig:   . amLODipine-valsartan (EXFORGE) 5-320 MG per tablet    Sig: Take 1 tablet by mouth daily.    Dispense:  30 tablet    Refill:  11  . LORazepam (ATIVAN) 1 MG tablet    Sig: Take 1 tablet (1 mg total) by mouth 2 (two) times daily as needed for anxiety.    Dispense:  30 tablet    Refill:  2  . DISCONTD: metFORMIN (GLUCOPHAGE) 1000 MG tablet    Sig: Take 0.5 tablets (500 mg total) by mouth daily with breakfast. 1/4 tablet daily    Dispense:  30 tablet    Refill:  11  . hydrochlorothiazide (HYDRODIURIL) 25 MG tablet    Sig: Take 1 tablet (25 mg total) by mouth daily.    Dispense:  30 tablet    Refill:  11  . glipiZIDE (GLUCOTROL) 5 MG tablet    Sig: Take 1 tablet (5 mg total) by mouth daily.    Dispense:  30 tablet    Refill:  11  . fenofibrate (TRICOR) 145 MG tablet    Sig: Take 1 tablet (145 mg total) by mouth every Monday, Wednesday, and Friday.    Dispense:  30 tablet    Refill:  11  . LANCETS ULTRA THIN MISC    Sig: Inject 1 Device into the skin daily at 6 (six) AM.    Dispense:  100 each    Refill:  3  . fluconazole (DIFLUCAN) 150 MG tablet    Sig: Take 1 tablet (150 mg total) by mouth once.    Dispense:  1 tablet    Refill:  1  . metFORMIN (GLUCOPHAGE) 500 MG tablet    Sig: Take 0.5 tablets (250 mg total) by mouth daily with breakfast.    Dispense:  30 tablet    Refill:  11   Medications Discontinued During This Encounter   Medication Reason  . ciprofloxacin (CIPRO) 500 MG tablet Completed Course  . metroNIDAZOLE (FLAGYL) 500 MG tablet Completed Course  . oxyCODONE-acetaminophen (PERCOCET/ROXICET) 5-325 MG per tablet Completed Course  . EXFORGE 5-320 MG per tablet Reorder  . LORazepam (ATIVAN) 1 MG tablet Reorder  . metFORMIN (GLUCOPHAGE) 1000 MG tablet Reorder  . hydrochlorothiazide (HYDRODIURIL) 25 MG tablet Reorder  . glipiZIDE (GLUCOTROL) 5 MG tablet Reorder  . fenofibrate (TRICOR) 145 MG tablet Reorder  . LANCETS ULTRA THIN MISC Reorder  . metFORMIN (GLUCOPHAGE) 1000 MG tablet Reorder   Return in about 3 months (around 12/12/2013) for Recheck medical problems.  Keslyn Teater P. [place]esto Charon, M.D.

## 2013-09-13 LAB — HEPATIC FUNCTION PANEL
ALT: 27 IU/L (ref 0–32)
AST: 24 IU/L (ref 0–40)
Albumin: 4.4 g/dL (ref 3.6–4.8)
Alkaline Phosphatase: 66 IU/L (ref 39–117)
Bilirubin, Direct: 0.09 mg/dL (ref 0.00–0.40)
Total Bilirubin: 0.3 mg/dL (ref 0.0–1.2)
Total Protein: 6.8 g/dL (ref 6.0–8.5)

## 2013-09-13 LAB — NMR, LIPOPROFILE
Cholesterol: 219 mg/dL — ABNORMAL HIGH (ref <200)
HDL Cholesterol by NMR: 37 mg/dL — ABNORMAL LOW (ref >=40)
HDL Particle Number: 26.7 umol/L — ABNORMAL LOW (ref >=30.5)
LDL Particle Number: 2271 nmol/L — ABNORMAL HIGH (ref <1000)
LDL Size: 20.1 nm — ABNORMAL LOW (ref >20.5)
LDLC SERPL CALC-MCNC: 134 mg/dL — ABNORMAL HIGH (ref <100)
LP-IR Score: 75 — ABNORMAL HIGH (ref <=45)
Small LDL Particle Number: 1546 nmol/L — ABNORMAL HIGH (ref <=527)
Triglycerides by NMR: 239 mg/dL — ABNORMAL HIGH (ref <150)

## 2013-09-13 LAB — VITAMIN D 25 HYDROXY (VIT D DEFICIENCY, FRACTURES): Vit D, 25-Hydroxy: 14.6 ng/mL — ABNORMAL LOW (ref 30.0–100.0)

## 2013-12-25 ENCOUNTER — Encounter: Payer: Self-pay | Admitting: Family Medicine

## 2013-12-25 ENCOUNTER — Ambulatory Visit (INDEPENDENT_AMBULATORY_CARE_PROVIDER_SITE_OTHER): Payer: Medicare Other | Admitting: Family Medicine

## 2013-12-25 VITALS — BP 153/76 | HR 95 | Temp 97.1°F | Ht 63.0 in | Wt 193.6 lb

## 2013-12-25 DIAGNOSIS — E559 Vitamin D deficiency, unspecified: Secondary | ICD-10-CM

## 2013-12-25 DIAGNOSIS — E119 Type 2 diabetes mellitus without complications: Secondary | ICD-10-CM

## 2013-12-25 DIAGNOSIS — J45909 Unspecified asthma, uncomplicated: Secondary | ICD-10-CM | POA: Insufficient documentation

## 2013-12-25 DIAGNOSIS — E785 Hyperlipidemia, unspecified: Secondary | ICD-10-CM

## 2013-12-25 DIAGNOSIS — I10 Essential (primary) hypertension: Secondary | ICD-10-CM

## 2013-12-25 DIAGNOSIS — N3010 Interstitial cystitis (chronic) without hematuria: Secondary | ICD-10-CM

## 2013-12-25 DIAGNOSIS — J449 Chronic obstructive pulmonary disease, unspecified: Secondary | ICD-10-CM | POA: Insufficient documentation

## 2013-12-25 DIAGNOSIS — F418 Other specified anxiety disorders: Secondary | ICD-10-CM

## 2013-12-25 DIAGNOSIS — F341 Dysthymic disorder: Secondary | ICD-10-CM

## 2013-12-25 LAB — POCT GLYCOSYLATED HEMOGLOBIN (HGB A1C): Hemoglobin A1C: 5.8

## 2013-12-25 MED ORDER — BUDESONIDE-FORMOTEROL FUMARATE 160-4.5 MCG/ACT IN AERO: 2.0000 | INHALATION_SPRAY | Freq: Two times a day (BID) | RESPIRATORY_TRACT | Status: DC

## 2013-12-25 MED ORDER — LORAZEPAM 1 MG PO TABS: 1.0000 mg | ORAL_TABLET | Freq: Two times a day (BID) | ORAL | Status: DC | PRN

## 2013-12-25 MED ORDER — ROFLUMILAST 500 MCG PO TABS: 500.0000 ug | ORAL_TABLET | Freq: Every day | ORAL | Status: DC

## 2013-12-25 MED ORDER — ALBUTEROL SULFATE HFA 108 (90 BASE) MCG/ACT IN AERS: 2.0000 | INHALATION_SPRAY | Freq: Four times a day (QID) | RESPIRATORY_TRACT | Status: DC | PRN

## 2013-12-25 NOTE — Progress Notes (Signed)
Patient ID: Diana Pratt, female   DOB: [birth year], 68 y.o.   MRN: [id number] SUBJECTIVE: CC: Chief Complaint  Patient presents with  . Follow-up    3 month follow up chronic problems. states COPD problems in Nov. . refill proair and lorazepam    HPI: COPD flare up since December.has been recurrent without a total symptom free period for more than a day. The cold weather has flared it up.  Patient is here for follow up of Diabetes Mellitus/Obesity/HTN/Hld: Symptoms evaluated: Denies Nocturia ,Denies Urinary Frequency , denies Blurred vision ,deniesDizziness,denies.Dysuria,denies paresthesias, denies extremity pain or ulcers.Marland Kitchendenies chest pain. has had an annual eye exam. do check the feet. Does check CBGs. Average CBG:110 Denies episodes of hypoglycemia. Does have an emergency hypoglycemic plan. admits toCompliance with medications. Denies Problems with medications.  Past Medical History  Diagnosis Date  . Hypertension   . Diabetes mellitus   . Anxiety   . Hyperlipemia   . Depression   . Interstitial cystitis   . COPD (chronic obstructive pulmonary disease)   . Asthma    Past Surgical History  Procedure Laterality Date  . Abdominal hysterectomy  03/1984    partial   History   Social History  . Marital Status: Married    Spouse Name: N/A    Number of Children: N/A  . Years of Education: N/A   Occupational History  . Not on file.   Social History Main Topics  . Smoking status: Former Smoker    Quit date: 06/01/1994  . Smokeless tobacco: Never Used  . Alcohol Use: Yes     Comment: once a month  . Drug Use: No  . Sexual Activity: No   Other Topics Concern  . Not on file   Social History Narrative  . No narrative on file   Family History  Problem Relation Age of Onset  . Parkinsonism Mother    Current Outpatient Prescriptions on File Prior to Visit  Medication Sig Dispense Refill  . acetaminophen (TYLENOL) 325 MG tablet Take 650 mg by mouth every 6  (six) hours as needed. pain      . albuterol (PROVENTIL) (2.5 MG/3ML) 0.083% nebulizer solution Take 2.5 mg by nebulization every 6 (six) hours as needed. Shortness of breath       . amLODipine-valsartan (EXFORGE) 5-320 MG per tablet Take 1 tablet by mouth daily.  30 tablet  11  . aspirin 81 MG EC tablet Take 81 mg by mouth daily.        . calcium carbonate 200 MG capsule Take 250 mg by mouth daily.       . Cholecalciferol (VITAMIN D) 2000 UNITS CAPS Take 2,000 Units by mouth daily.       Marland Kitchen escitalopram (LEXAPRO) 10 MG tablet Take 1 tablet (10 mg total) by mouth daily.  30 tablet  5  . fenofibrate (TRICOR) 145 MG tablet Take 1 tablet (145 mg total) by mouth every Monday, Wednesday, and Friday.  30 tablet  11  . fluticasone (FLONASE) 50 MCG/ACT nasal spray Place 2 sprays into the nose daily as needed. Congestion       . glipiZIDE (GLUCOTROL) 5 MG tablet Take 1 tablet (5 mg total) by mouth daily.  30 tablet  11  . glucose blood test strip       . hydrochlorothiazide (HYDRODIURIL) 25 MG tablet Take 1 tablet (25 mg total) by mouth daily.  30 tablet  11  . hydroxypropyl methylcellulose (ISOPTO TEARS) 2.5 % ophthalmic solution Place  1 drop into both eyes daily as needed. for dry eyes       . Insulin Glargine (LANTUS SOLOSTAR) 100 UNIT/ML SOPN Inject 29 Units into the skin daily at 10 pm.  12 mL  11  . LANCETS ULTRA THIN MISC Inject 1 Device into the skin daily at 6 (six) AM.  100 each  3  . metFORMIN (GLUCOPHAGE) 500 MG tablet Take 0.5 tablets (250 mg total) by mouth daily with breakfast.  30 tablet  11  . montelukast (SINGULAIR) 10 MG tablet Take 10 mg by mouth at bedtime.       . Multiple Vitamins-Minerals (CENTRUM SILVER PO) Take 1 tablet by mouth daily.       . sitaGLIPtin (JANUVIA) 100 MG tablet Take 100 mg by mouth daily.      . fluconazole (DIFLUCAN) 150 MG tablet Take 1 tablet (150 mg total) by mouth once.  1 tablet  1   No current facility-administered medications on file prior to visit.    Allergies  Allergen Reactions  . Lipitor [Atorvastatin]     Myalgias   . Morphine And Related Other (See Comments)    Burning, itching  . Penicillins   . Simvastatin Other (See Comments)    myalgias  . Sulfa Antibiotics   . Zetia [Ezetimibe] Cough   Immunization History  Administered Date(s) Administered  . DTaP 01/04/2003   Prior to Admission medications   Medication Sig Start Date End Date Taking? Authorizing Provider  acetaminophen (TYLENOL) 325 MG tablet Take 650 mg by mouth every 6 (six) hours as needed. pain   Yes Historical Provider, MD  albuterol (PROAIR HFA) 108 (90 BASE) MCG/ACT inhaler Inhale 2 puffs into the lungs every 6 (six) hours as needed. 01/27/13  Yes Vernie Shanks, MD  albuterol (PROVENTIL) (2.5 MG/3ML) 0.083% nebulizer solution Take 2.5 mg by nebulization every 6 (six) hours as needed. Shortness of breath    Yes Historical Provider, MD  amLODipine-valsartan (EXFORGE) 5-320 MG per tablet Take 1 tablet by mouth daily. 09/11/13  Yes Vernie Shanks, MD  aspirin 81 MG EC tablet Take 81 mg by mouth daily.     Yes Historical Provider, MD  calcium carbonate 200 MG capsule Take 250 mg by mouth daily.    Yes Historical Provider, MD  Cholecalciferol (VITAMIN D) 2000 UNITS CAPS Take 2,000 Units by mouth daily.    Yes Historical Provider, MD  escitalopram (LEXAPRO) 10 MG tablet Take 1 tablet (10 mg total) by mouth daily. 02/27/13  Yes Vernie Shanks, MD  fenofibrate (TRICOR) 145 MG tablet Take 1 tablet (145 mg total) by mouth every Monday, Wednesday, and Friday. 09/11/13  Yes Vernie Shanks, MD  fluticasone (FLONASE) 50 MCG/ACT nasal spray Place 2 sprays into the nose daily as needed. Congestion    Yes Historical Provider, MD  glipiZIDE (GLUCOTROL) 5 MG tablet Take 1 tablet (5 mg total) by mouth daily. 09/11/13  Yes Vernie Shanks, MD  glucose blood test strip  06/17/13  Yes Historical Provider, MD  hydrochlorothiazide (HYDRODIURIL) 25 MG tablet Take 1 tablet (25 mg total) by  mouth daily. 09/11/13  Yes Vernie Shanks, MD  hydroxypropyl methylcellulose (ISOPTO TEARS) 2.5 % ophthalmic solution Place 1 drop into both eyes daily as needed. for dry eyes    Yes Historical Provider, MD  Insulin Glargine (LANTUS SOLOSTAR) 100 UNIT/ML SOPN Inject 29 Units into the skin daily at 10 pm. 05/29/13  Yes Vernie Shanks, MD  [place]  MISC Inject 1 Device into the skin daily at 6 (six) AM. 09/11/13  Yes Vernie Shanks, MD  LORazepam (ATIVAN) 1 MG tablet Take 1 tablet (1 mg total) by mouth 2 (two) times daily as needed for anxiety. 09/11/13  Yes Vernie Shanks, MD  metFORMIN (GLUCOPHAGE) 500 MG tablet Take 0.5 tablets (250 mg total) by mouth daily with breakfast. 09/11/13  Yes Vernie Shanks, MD  montelukast (SINGULAIR) 10 MG tablet Take 10 mg by mouth at bedtime.  12/26/12  Yes Historical Provider, MD  Multiple Vitamins-Minerals (CENTRUM SILVER PO) Take 1 tablet by mouth daily.    Yes Historical Provider, MD  naproxen sodium (ALEVE) 220 MG tablet Take 660 mg by mouth daily as needed (for pain).   Yes Historical Provider, MD  sitaGLIPtin (JANUVIA) 100 MG tablet Take 100 mg by mouth daily.   Yes Historical Provider, MD  fluconazole (DIFLUCAN) 150 MG tablet Take 1 tablet (150 mg total) by mouth once. 09/11/13   Vernie Shanks, MD     ROS: As above in the HPI. All other systems are stable or negative.  OBJECTIVE: APPEARANCE:  Patient in no acute distress.The patient appeared well nourished and normally developed. Acyanotic. Waist: VITAL SIGNS:BP 153/76  Pulse 95  Temp(Src) 97.1 F (36.2 C) (Oral)  Ht 5' 3" (1.6 m)  Wt 193 lb 9.6 oz (87.816 kg)  BMI 34.30 kg/m2  SpO2 96%  OBese WF  SKIN: warm and  Dry without overt rashes, tattoos and scars  HEAD and Neck: without JVD, Head and scalp: normal Eyes:No scleral icterus. Fundi normal, eye movements normal. Ears: Auricle normal, canal normal, Tympanic membranes normal, insufflation normal. Nose: normal Throat: normal Neck  & thyroid: normal  CHEST & LUNGS: Chest wall: normal Lungs: Coarse breath sounds, with Rhonchi and scatterred wheezes., no Rales  CVS: Reveals the PMI to be normally located. Regular rhythm, First and Second Heart sounds are normal,  absence of murmurs, rubs or gallops. Peripheral vasculature: Radial pulses: normal Dorsal pedis pulses: normal Posterior pulses: normal  ABDOMEN:  Appearance: Obese Benign, no organomegaly, no masses, no Abdominal Aortic enlargement. No Guarding , no rebound. No Bruits. Bowel sounds: normal  RECTAL: N/A GU: N/A  EXTREMETIES: nonedematous.  NEUROLOGIC: oriented to time,place and person; nonfocal. Strength is normal Sensory is normal Reflexes are normal Cranial Nerves are normal. Results for orders placed in visit on 12/25/13  POCT GLYCOSYLATED HEMOGLOBIN (HGB A1C)      Result Value Ref Range   Hemoglobin A1C 5.8      ASSESSMENT:   Asthma - Plan: budesonide-formoterol (SYMBICORT) 160-4.5 MCG/ACT inhaler, albuterol (PROAIR HFA) 108 (90 BASE) MCG/ACT inhaler  COPD (chronic obstructive pulmonary disease) - Plan: budesonide-formoterol (SYMBICORT) 160-4.5 MCG/ACT inhaler, roflumilast (DALIRESP) 500 MCG TABS tablet  HTN (hypertension)  Hyperlipemia - Plan: NMR, lipoprofile  Type 2 diabetes mellitus - Plan: POCT glycosylated hemoglobin (Hb A1C), CMP14+EGFR  Unspecified vitamin D deficiency - Plan: Vit D  25 hydroxy (rtn osteoporosis monitoring)  Interstitial cystitis  Depression with anxiety - Plan: LORazepam (ATIVAN) 1 MG tablet  Persistent COPD flare.  PLAN:      Dr Paula Libra Recommendations  For nutrition information, I recommend books:  1).Eat to Live by Dr Excell Seltzer. 2).Prevent and Reverse Heart Disease by Dr Karl Luke. 3) Dr Janene Harvey Book:  Program to Reverse Diabetes  Exercise recommendations are:  If unable to walk, then the patient can exercise in a chair 3 times a day. By flapping arms like a  bird gently and raising legs outwards to the front.  If ambulatory, the patient can go for walks for 30 minutes 3 times a week. Then increase the intensity and duration as tolerated.  Goal is to try to attain exercise frequency to 5 times a week.  If applicable: Best to perform resistance exercises (machines or weights) 2 days a week and cardio type exercises 3 days per week.   Encouraged to keep warm, get back on diet and exercise, since her weight loss group disintegrated she should not give up.  Handouts on DM footcare etc. See AVS.  Orders Placed This Encounter  Procedures  . CMP14+EGFR  . NMR, lipoprofile  . Vit D  25 hydroxy (rtn osteoporosis monitoring)  . POCT glycosylated hemoglobin (Hb A1C)   Meds ordered this encounter  Medications  . ibuprofen (ADVIL,MOTRIN) 200 MG tablet    Sig: Take 400 mg by mouth daily.  . budesonide-formoterol (SYMBICORT) 160-4.5 MCG/ACT inhaler    Sig: Inhale 2 puffs into the lungs 2 (two) times daily.    Dispense:  2 Inhaler    Refill:  0  . roflumilast (DALIRESP) 500 MCG TABS tablet    Sig: Take 1 tablet (500 mcg total) by mouth daily.    Dispense:  28 tablet    Refill:  0  . LORazepam (ATIVAN) 1 MG tablet    Sig: Take 1 tablet (1 mg total) by mouth 2 (two) times daily as needed for anxiety.    Dispense:  60 tablet    Refill:  1  . albuterol (PROAIR HFA) 108 (90 BASE) MCG/ACT inhaler    Sig: Inhale 2 puffs into the lungs every 6 (six) hours as needed for shortness of breath.    Dispense:  18 g    Refill:  1   Medications Discontinued During This Encounter  Medication Reason  . naproxen sodium (ALEVE) 220 MG tablet Change in therapy  . LORazepam (ATIVAN) 1 MG tablet Reorder  . albuterol (PROAIR HFA) 108 (90 BASE) MCG/ACT inhaler Reorder   Return in about 2 weeks (around 01/08/2014) for recheck COPD.  Leman Martinek P. Jacelyn Grip, M.D.

## 2013-12-25 NOTE — Patient Instructions (Signed)
      Dr Alanzo Lamb's Recommendations  For nutrition information, I recommend books:  1).Eat to Live by Dr Joel Fuhrman. 2).Prevent and Reverse Heart Disease by Dr Caldwell Esselstyn. 3) Dr Neal Barnard's Book:  Program to Reverse Diabetes  Exercise recommendations are:  If unable to walk, then the patient can exercise in a chair 3 times a day. By flapping arms like a bird gently and raising legs outwards to the front.  If ambulatory, the patient can go for walks for 30 minutes 3 times a week. Then increase the intensity and duration as tolerated.  Goal is to try to attain exercise frequency to 5 times a week.  If applicable: Best to perform resistance exercises (machines or weights) 2 days a week and cardio type exercises 3 days per week.  

## 2013-12-28 LAB — CMP14+EGFR
ALT: 12 IU/L (ref 0–32)
AST: 13 IU/L (ref 0–40)
Albumin/Globulin Ratio: 1.6 (ref 1.1–2.5)
Albumin: 4.1 g/dL (ref 3.6–4.8)
Alkaline Phosphatase: 64 IU/L (ref 39–117)
BUN/Creatinine Ratio: 27 — ABNORMAL HIGH (ref 11–26)
BUN: 21 mg/dL (ref 8–27)
CO2: 22 mmol/L (ref 18–29)
Calcium: 9 mg/dL (ref 8.7–10.3)
Chloride: 104 mmol/L (ref 97–108)
Creatinine, Ser: 0.79 mg/dL (ref 0.57–1.00)
GFR calc Af Amer: 90 mL/min/{1.73_m2} (ref >59)
GFR calc non Af Amer: 78 mL/min/{1.73_m2} (ref >59)
Globulin, Total: 2.5 g/dL (ref 1.5–4.5)
Glucose: 92 mg/dL (ref 65–99)
Potassium: 4.2 mmol/L (ref 3.5–5.2)
Sodium: 144 mmol/L (ref 134–144)
Total Bilirubin: 0.4 mg/dL (ref 0.0–1.2)
Total Protein: 6.6 g/dL (ref 6.0–8.5)

## 2013-12-28 LAB — NMR, LIPOPROFILE
Cholesterol: 264 mg/dL — ABNORMAL HIGH (ref <200)
HDL Cholesterol by NMR: 38 mg/dL — ABNORMAL LOW (ref >=40)
HDL Particle Number: 22.3 umol/L — ABNORMAL LOW (ref >=30.5)
LDL Particle Number: 3010 nmol/L — ABNORMAL HIGH (ref <1000)
LDL Size: 20.3 nm — ABNORMAL LOW (ref >20.5)
LDLC SERPL CALC-MCNC: 192 mg/dL — ABNORMAL HIGH (ref <100)
LP-IR Score: 70 — ABNORMAL HIGH (ref <=45)
Small LDL Particle Number: 1952 nmol/L — ABNORMAL HIGH (ref <=527)
Triglycerides by NMR: 171 mg/dL — ABNORMAL HIGH (ref <150)

## 2013-12-28 LAB — VITAMIN D 25 HYDROXY (VIT D DEFICIENCY, FRACTURES): Vit D, 25-Hydroxy: 14.3 ng/mL — ABNORMAL LOW (ref 30.0–100.0)

## 2014-01-07 ENCOUNTER — Ambulatory Visit (INDEPENDENT_AMBULATORY_CARE_PROVIDER_SITE_OTHER): Payer: Medicare Other | Admitting: Family Medicine

## 2014-01-07 ENCOUNTER — Encounter: Payer: Self-pay | Admitting: Family Medicine

## 2014-01-07 VITALS — BP 141/75 | HR 71 | Temp 96.8°F | Ht 63.0 in | Wt 190.4 lb

## 2014-01-07 DIAGNOSIS — I10 Essential (primary) hypertension: Secondary | ICD-10-CM

## 2014-01-07 DIAGNOSIS — E559 Vitamin D deficiency, unspecified: Secondary | ICD-10-CM

## 2014-01-07 DIAGNOSIS — F341 Dysthymic disorder: Secondary | ICD-10-CM

## 2014-01-07 DIAGNOSIS — E119 Type 2 diabetes mellitus without complications: Secondary | ICD-10-CM

## 2014-01-07 DIAGNOSIS — N3010 Interstitial cystitis (chronic) without hematuria: Secondary | ICD-10-CM

## 2014-01-07 DIAGNOSIS — E785 Hyperlipidemia, unspecified: Secondary | ICD-10-CM

## 2014-01-07 DIAGNOSIS — F418 Other specified anxiety disorders: Secondary | ICD-10-CM

## 2014-01-07 DIAGNOSIS — J449 Chronic obstructive pulmonary disease, unspecified: Secondary | ICD-10-CM

## 2014-01-07 MED ORDER — PRAVASTATIN SODIUM 10 MG PO TABS: 10.0000 mg | ORAL_TABLET | Freq: Every day | ORAL | Status: DC

## 2014-01-07 MED ORDER — GLIPIZIDE 2.5 MG HALF TABLET: 2.5000 mg | ORAL_TABLET | Freq: Every day | ORAL | Status: DC

## 2014-01-07 NOTE — Progress Notes (Signed)
Patient ID: Diana Pratt, female   DOB: 27-Feb-1946, 68 y.o.   MRN: [id number] SUBJECTIVE: CC: Chief Complaint  Patient presents with  . Bronchitis    recheck    HPI: Follow up on COPD. Doing great. Patient is here for follow up of Diabetes Mellitus/vit d def: Symptoms evaluated: Denies Nocturia ,Denies Urinary Frequency , denies Blurred vision ,deniesDizziness,denies.Dysuria,denies paresthesias, denies extremity pain or ulcers.Marland Kitchendenies chest pain. has had an annual eye exam. do check the feet. Does check CBGs. Average VFI:EPPIRJ Denies episodes of hypoglycemia. Does have an emergency hypoglycemic plan. admits toCompliance with medications. Denies Problems with medications.   Past Medical History  Diagnosis Date  . Hypertension   . Diabetes mellitus   . Anxiety   . Hyperlipemia   . Depression   . Interstitial cystitis   . COPD (chronic obstructive pulmonary disease)   . Asthma    Past Surgical History  Procedure Laterality Date  . Abdominal hysterectomy  03/1984    partial   History   Social History  . Marital Status: Married    Spouse Name: N/A    Number of Children: N/A  . Years of Education: N/A   Occupational History  . Not on file.   Social History Main Topics  . Smoking status: Former Smoker    Quit date: 06/01/1994  . Smokeless tobacco: Never Used  . Alcohol Use: Yes     Comment: once a month  . Drug Use: No  . Sexual Activity: No   Other Topics Concern  . Not on file   Social History Narrative  . No narrative on file   Family History  Problem Relation Age of Onset  . Parkinsonism Mother    Current Outpatient Prescriptions on File Prior to Visit  Medication Sig Dispense Refill  . acetaminophen (TYLENOL) 325 MG tablet Take 650 mg by mouth every 6 (six) hours as needed. pain      . albuterol (PROAIR HFA) 108 (90 BASE) MCG/ACT inhaler Inhale 2 puffs into the lungs every 6 (six) hours as needed for shortness of breath.  18 g  1  . albuterol  (PROVENTIL) (2.5 MG/3ML) 0.083% nebulizer solution Take 2.5 mg by nebulization every 6 (six) hours as needed. Shortness of breath       . amLODipine-valsartan (EXFORGE) 5-320 MG per tablet Take 1 tablet by mouth daily.  30 tablet  11  . aspirin 81 MG EC tablet Take 81 mg by mouth daily.        . budesonide-formoterol (SYMBICORT) 160-4.5 MCG/ACT inhaler Inhale 2 puffs into the lungs 2 (two) times daily.  2 Inhaler  0  . calcium carbonate 200 MG capsule Take 250 mg by mouth daily.       . Cholecalciferol (VITAMIN D) 2000 UNITS CAPS Take 2,000 Units by mouth daily.       Marland Kitchen escitalopram (LEXAPRO) 10 MG tablet Take 1 tablet (10 mg total) by mouth daily.  30 tablet  5  . fenofibrate (TRICOR) 145 MG tablet Take 1 tablet (145 mg total) by mouth every Monday, Wednesday, and Friday.  30 tablet  11  . fluticasone (FLONASE) 50 MCG/ACT nasal spray Place 2 sprays into the nose daily as needed. Congestion       . glucose blood test strip       . hydrochlorothiazide (HYDRODIURIL) 25 MG tablet Take 1 tablet (25 mg total) by mouth daily.  30 tablet  11  . hydroxypropyl methylcellulose (ISOPTO TEARS) 2.5 % ophthalmic solution Place  1 drop into both eyes daily as needed. for dry eyes       . ibuprofen (ADVIL,MOTRIN) 200 MG tablet Take 400 mg by mouth daily.      . Insulin Glargine (LANTUS SOLOSTAR) 100 UNIT/ML SOPN Inject 29 Units into the skin daily at 10 pm.  12 mL  11  . LANCETS ULTRA THIN MISC Inject 1 Device into the skin daily at 6 (six) AM.  100 each  3  . LORazepam (ATIVAN) 1 MG tablet Take 1 tablet (1 mg total) by mouth 2 (two) times daily as needed for anxiety.  60 tablet  1  . montelukast (SINGULAIR) 10 MG tablet Take 10 mg by mouth at bedtime.       . Multiple Vitamins-Minerals (CENTRUM SILVER PO) Take 1 tablet by mouth daily.       . roflumilast (DALIRESP) 500 MCG TABS tablet Take 1 tablet (500 mcg total) by mouth daily.  28 tablet  0  . sitaGLIPtin (JANUVIA) 100 MG tablet Take 100 mg by mouth daily.        No current facility-administered medications on file prior to visit.   Allergies  Allergen Reactions  . Lipitor [Atorvastatin]     Myalgias   . Morphine And Related Other (See Comments)    Burning, itching  . Penicillins   . Simvastatin Other (See Comments)    myalgias  . Sulfa Antibiotics   . Zetia [Ezetimibe] Cough   Immunization History  Administered Date(s) Administered  . DTaP 01/04/2003   Prior to Admission medications   Medication Sig Start Date End Date Taking? Authorizing Provider  acetaminophen (TYLENOL) 325 MG tablet Take 650 mg by mouth every 6 (six) hours as needed. pain   Yes Historical Provider, MD  albuterol (PROAIR HFA) 108 (90 BASE) MCG/ACT inhaler Inhale 2 puffs into the lungs every 6 (six) hours as needed for shortness of breath. 12/25/13  Yes Vernie Shanks, MD  albuterol (PROVENTIL) (2.5 MG/3ML) 0.083% nebulizer solution Take 2.5 mg by nebulization every 6 (six) hours as needed. Shortness of breath    Yes Historical Provider, MD  amLODipine-valsartan (EXFORGE) 5-320 MG per tablet Take 1 tablet by mouth daily. 09/11/13  Yes Vernie Shanks, MD  aspirin 81 MG EC tablet Take 81 mg by mouth daily.     Yes Historical Provider, MD  budesonide-formoterol (SYMBICORT) 160-4.5 MCG/ACT inhaler Inhale 2 puffs into the lungs 2 (two) times daily. 12/25/13  Yes Vernie Shanks, MD  calcium carbonate 200 MG capsule Take 250 mg by mouth daily.    Yes Historical Provider, MD  Cholecalciferol (VITAMIN D) 2000 UNITS CAPS Take 2,000 Units by mouth daily.    Yes Historical Provider, MD  escitalopram (LEXAPRO) 10 MG tablet Take 1 tablet (10 mg total) by mouth daily. 02/27/13  Yes Vernie Shanks, MD  fenofibrate (TRICOR) 145 MG tablet Take 1 tablet (145 mg total) by mouth every Monday, Wednesday, and Friday. 09/11/13  Yes Vernie Shanks, MD  fluticasone (FLONASE) 50 MCG/ACT nasal spray Place 2 sprays into the nose daily as needed. Congestion    Yes Historical Provider, MD  glipiZIDE  (GLUCOTROL) 2.5 mg TABS tablet Take 0.5 tablets (2.5 mg total) by mouth daily. 01/07/14  Yes Vernie Shanks, MD  glucose blood test strip  06/17/13  Yes Historical Provider, MD  hydrochlorothiazide (HYDRODIURIL) 25 MG tablet Take 1 tablet (25 mg total) by mouth daily. 09/11/13  Yes Vernie Shanks, MD  hydroxypropyl methylcellulose (ISOPTO TEARS) 2.5 %  ophthalmic solution Place 1 drop into both eyes daily as needed. for dry eyes    Yes Historical Provider, MD  ibuprofen (ADVIL,MOTRIN) 200 MG tablet Take 400 mg by mouth daily.   Yes Historical Provider, MD  Insulin Glargine (LANTUS SOLOSTAR) 100 UNIT/ML SOPN Inject 29 Units into the skin daily at 10 pm. 05/29/13  Yes Vernie Shanks, MD  LANCETS ULTRA THIN MISC Inject 1 Device into the skin daily at 6 (six) AM. 09/11/13  Yes Vernie Shanks, MD  LORazepam (ATIVAN) 1 MG tablet Take 1 tablet (1 mg total) by mouth 2 (two) times daily as needed for anxiety. 12/25/13  Yes Vernie Shanks, MD  montelukast (SINGULAIR) 10 MG tablet Take 10 mg by mouth at bedtime.  12/26/12  Yes Historical Provider, MD  Multiple Vitamins-Minerals (CENTRUM SILVER PO) Take 1 tablet by mouth daily.    Yes Historical Provider, MD  roflumilast (DALIRESP) 500 MCG TABS tablet Take 1 tablet (500 mcg total) by mouth daily. 12/25/13  Yes Vernie Shanks, MD  sitaGLIPtin (JANUVIA) 100 MG tablet Take 100 mg by mouth daily.   Yes Historical Provider, MD  Cholecalciferol (VITAMIN D3) 2000 UNITS capsule Take 2 capsules (4,000 Units total) by mouth daily. 01/07/14   Vernie Shanks, MD  pravastatin (PRAVACHOL) 10 MG tablet Take 1 tablet (10 mg total) by mouth daily. 01/07/14   Vernie Shanks, MD     ROS: As above in the HPI. All other systems are stable or negative.  OBJECTIVE: APPEARANCE:  Patient in no acute distress.The patient appeared well nourished and normally developed. Acyanotic. Waist: VITAL SIGNS:BP 141/75  Pulse 71  Temp(Src) 96.8 F (36 C) (Oral)  Ht 5' 3" (1.6 m)  Wt 190 lb 6.4 oz  (86.365 kg)  BMI 33.74 kg/m2 Obese WF   SKIN: warm and  Dry without overt rashes, tattoos and scars  HEAD and Neck: without JVD, Head and scalp: normal Eyes:No scleral icterus. Fundi normal, eye movements normal. Ears: Auricle normal, canal normal, Tympanic membranes normal, insufflation normal. Nose: normal Throat: normal Neck & thyroid: normal  CHEST & LUNGS: Chest wall: normal Lungs: Clear today  CVS: Reveals the PMI to be normally located. Regular rhythm, First and Second Heart sounds are normal,  absence of murmurs, rubs or gallops. Peripheral vasculature: Radial pulses: normal Dorsal pedis pulses: normal Posterior pulses: normal  ABDOMEN:  Appearance: Obese Benign, no organomegaly, no masses, no Abdominal Aortic enlargement. No Guarding , no rebound. No Bruits. Bowel sounds: normal  RECTAL: N/A GU: N/A  EXTREMETIES: nonedematous.  MUSCULOSKELETAL:  Spine: normal Joints: intact  NEUROLOGIC: oriented to time,place and person; nonfocal.  Results for orders placed in visit on 12/25/13  CMP14+EGFR      Result Value Ref Range   Glucose 92  65 - 99 mg/dL   BUN 21  8 - 27 mg/dL   Creatinine, Ser 0.79  0.57 - 1.00 mg/dL   GFR calc non Af Amer 78  >59 mL/min/1.73   GFR calc Af Amer 90  >59 mL/min/1.73   BUN/Creatinine Ratio 27 (*) 11 - 26   Sodium 144  134 - 144 mmol/L   Potassium 4.2  3.5 - 5.2 mmol/L   Chloride 104  97 - 108 mmol/L   CO2 22  18 - 29 mmol/L   Calcium 9.0  8.7 - 10.3 mg/dL   Total Protein 6.6  6.0 - 8.5 g/dL   Albumin 4.1  3.6 - 4.8 g/dL   Globulin, Total 2.5  1.5 - 4.5 g/dL   Albumin/Globulin Ratio 1.6  1.1 - 2.5   Total Bilirubin 0.4  0.0 - 1.2 mg/dL   Alkaline Phosphatase 64  39 - 117 IU/L   AST 13  0 - 40 IU/L   ALT 12  0 - 32 IU/L  NMR, LIPOPROFILE      Result Value Ref Range   LDL Particle Number [id number] (*) <1000 nmol/L   LDLC SERPL CALC-MCNC 192 (*) <100 mg/dL   HDL Cholesterol by NMR 38 (*) >=40 mg/dL   Triglycerides by NMR  171 (*) <150 mg/dL   Cholesterol 264 (*) <200 mg/dL   HDL Particle Number 22.3 (*) >=30.5 umol/L   Small LDL Particle Number [id number] (*) <=527 nmol/L   LDL Size 20.3 (*) >20.5 nm   LP-IR Score 70 (*) <=45  VITAMIN D 25 HYDROXY      Result Value Ref Range   Vit D, 25-Hydroxy 14.3 (*) 30.0 - 100.0 ng/mL  POCT GLYCOSYLATED HEMOGLOBIN (HGB A1C)      Result Value Ref Range   Hemoglobin A1C 5.8      ASSESSMENT:  Unspecified vitamin D deficiency - Plan: Cholecalciferol (VITAMIN D3) 2000 UNITS capsule  Type 2 diabetes mellitus - Plan: glipiZIDE (GLUCOTROL) 2.5 mg TABS tablet  HLD (hyperlipidemia) - Plan: pravastatin (PRAVACHOL) 10 MG tablet  COPD (chronic obstructive pulmonary disease)  HTN (hypertension)  Hyperlipemia  Interstitial cystitis  Depression with anxiety COPD is better.  PLAN: Reviewed labs with patient.  Medications adjusted.       Dr Paula Libra Recommendations  For nutrition information, I recommend books:  1).Eat to Live by Dr Excell Seltzer. 2).Prevent and Reverse Heart Disease by Dr Karl Luke. 3) Dr Janene Harvey Book:  Program to Reverse Diabetes  Exercise recommendations are:  If unable to walk, then the patient can exercise in a chair 3 times a day. By flapping arms like a bird gently and raising legs outwards to the front.  If ambulatory, the patient can go for walks for 30 minutes 3 times a week. Then increase the intensity and duration as tolerated.  Goal is to try to attain exercise frequency to 5 times a week.  If applicable: Best to perform resistance exercises (machines or weights) 2 days a week and cardio type exercises 3 days per week.  No orders of the defined types were placed in this encounter.   Meds ordered this encounter  Medications  . Cholecalciferol (VITAMIN D3) 2000 UNITS capsule    Sig: Take 2 capsules (4,000 Units total) by mouth daily.    Dispense:  60 capsule    Refill:  11  . glipiZIDE (GLUCOTROL) 2.5 mg TABS  tablet    Sig: Take 0.5 tablets (2.5 mg total) by mouth daily.    Dispense:  30 tablet    Refill:  11  . pravastatin (PRAVACHOL) 10 MG tablet    Sig: Take 1 tablet (10 mg total) by mouth daily.    Dispense:  30 tablet    Refill:  3   Medications Discontinued During This Encounter  Medication Reason  . fluconazole (DIFLUCAN) 150 MG tablet Completed Course  . metFORMIN (GLUCOPHAGE) 500 MG tablet Side effect (s)  . glipiZIDE (GLUCOTROL) 5 MG tablet Reorder   Return in about 4 weeks (around 02/04/2014) for Recheck medical problems.  Trulee Hamstra P. Jacelyn Grip, M.D.

## 2014-01-07 NOTE — Patient Instructions (Signed)
      Dr Hallis Meditz's Recommendations  For nutrition information, I recommend books:  1).Eat to Live by Dr Joel Fuhrman. 2).Prevent and Reverse Heart Disease by Dr Caldwell Esselstyn. 3) Dr Neal Barnard's Book:  Program to Reverse Diabetes  Exercise recommendations are:  If unable to walk, then the patient can exercise in a chair 3 times a day. By flapping arms like a bird gently and raising legs outwards to the front.  If ambulatory, the patient can go for walks for 30 minutes 3 times a week. Then increase the intensity and duration as tolerated.  Goal is to try to attain exercise frequency to 5 times a week.  If applicable: Best to perform resistance exercises (machines or weights) 2 days a week and cardio type exercises 3 days per week.  

## 2014-02-04 ENCOUNTER — Encounter: Payer: Self-pay | Admitting: Family Medicine

## 2014-02-04 ENCOUNTER — Ambulatory Visit (INDEPENDENT_AMBULATORY_CARE_PROVIDER_SITE_OTHER): Payer: Medicare Other | Admitting: Family Medicine

## 2014-02-04 VITALS — BP 146/83 | HR 77 | Temp 97.3°F | Ht 63.0 in | Wt 193.2 lb

## 2014-02-04 DIAGNOSIS — F341 Dysthymic disorder: Secondary | ICD-10-CM

## 2014-02-04 DIAGNOSIS — E119 Type 2 diabetes mellitus without complications: Secondary | ICD-10-CM

## 2014-02-04 DIAGNOSIS — E559 Vitamin D deficiency, unspecified: Secondary | ICD-10-CM

## 2014-02-04 DIAGNOSIS — E785 Hyperlipidemia, unspecified: Secondary | ICD-10-CM

## 2014-02-04 DIAGNOSIS — J449 Chronic obstructive pulmonary disease, unspecified: Secondary | ICD-10-CM

## 2014-02-04 DIAGNOSIS — F418 Other specified anxiety disorders: Secondary | ICD-10-CM

## 2014-02-04 DIAGNOSIS — I10 Essential (primary) hypertension: Secondary | ICD-10-CM

## 2014-02-04 MED ORDER — AMLODIPINE BESYLATE-VALSARTAN 10-320 MG PO TABS: 1.0000 | ORAL_TABLET | Freq: Every day | ORAL | Status: DC

## 2014-02-04 MED ORDER — SITAGLIPTIN PHOSPHATE 100 MG PO TABS: 100.0000 mg | ORAL_TABLET | Freq: Every day | ORAL | Status: DC

## 2014-02-04 MED ORDER — LORAZEPAM 1 MG PO TABS: 1.0000 mg | ORAL_TABLET | Freq: Two times a day (BID) | ORAL | Status: DC | PRN

## 2014-02-04 NOTE — Patient Instructions (Signed)
DASH Diet The DASH diet stands for "Dietary Approaches to Stop Hypertension." It is a healthy eating plan that has been shown to reduce high blood pressure (hypertension) in as little as 14 days, while also possibly providing other significant health benefits. These other health benefits include reducing the risk of breast cancer after menopause and reducing the risk of type 2 diabetes, heart disease, colon cancer, and stroke. Health benefits also include weight loss and slowing kidney failure in patients with chronic kidney disease.  DIET GUIDELINES  Limit salt (sodium). Your diet should contain less than 1500 mg of sodium daily.  Limit refined or processed carbohydrates. Your diet should include mostly whole grains. Desserts and added sugars should be used sparingly.  Include small amounts of heart-healthy fats. These types of fats include nuts, oils, and tub margarine. Limit saturated and trans fats. These fats have been shown to be harmful in the body. CHOOSING FOODS  The following food groups are based on a 2000 calorie diet. See your Registered Dietitian for individual calorie needs. Grains and Grain Products (6 to 8 servings daily)  Eat More Often: Whole-wheat bread, brown rice, whole-grain or wheat pasta, quinoa, popcorn without added fat or salt (air popped).  Eat Less Often: White bread, white pasta, white rice, cornbread. Vegetables (4 to 5 servings daily)  Eat More Often: Fresh, frozen, and canned vegetables. Vegetables may be raw, steamed, roasted, or grilled with a minimal amount of fat.  Eat Less Often/Avoid: Creamed or fried vegetables. Vegetables in a cheese sauce. Fruit (4 to 5 servings daily)  Eat More Often: All fresh, canned (in natural juice), or frozen fruits. Dried fruits without added sugar. One hundred percent fruit juice ( cup [237 mL] daily).  Eat Less Often: Dried fruits with added sugar. Canned fruit in light or heavy syrup. [employer], Fish, and Poultry (2  servings or less daily. One serving is 3 to 4 oz [85-114 g]).  Eat More Often: Ninety percent or leaner ground beef, tenderloin, sirloin. Round cuts of beef, chicken breast, [place] breast. All fish. Grill, bake, or broil your meat. Nothing should be fried.  Eat Less Often/Avoid: Fatty cuts of meat, [place], or chicken leg, thigh, or wing. Fried cuts of meat or fish. Dairy (2 to 3 servings)  Eat More Often: Low-fat or fat-free milk, low-fat plain or light yogurt, reduced-fat or part-skim cheese.  Eat Less Often/Avoid: Milk (whole, 2%).Whole milk yogurt. Full-fat cheeses. Nuts, Seeds, and Legumes (4 to 5 servings per week)  Eat More Often: All without added salt.  Eat Less Often/Avoid: Salted nuts and seeds, canned beans with added salt. Fats and Sweets (limited)  Eat More Often: Vegetable oils, tub margarines without trans fats, sugar-free gelatin. Mayonnaise and salad dressings.  Eat Less Often/Avoid: Coconut oils, palm oils, butter, stick margarine, cream, half and half, cookies, candy, pie. FOR MORE INFORMATION The Dash Diet Eating Plan: www.dashdiet.org Document Released: 10/11/2011 Document Revised: 01/14/2012 Document Reviewed: 10/11/2011 [facility] Patient Information 2014 [place], [place].        Dr Paula Libra Recommendations  For nutrition information, I recommend books:  1).Eat to Live by Dr Excell Seltzer. 2).Prevent and Reverse Heart Disease by Dr Karl Luke. 3) Dr Janene Harvey Book:  Program to Reverse Diabetes  Exercise recommendations are:  If unable to walk, then the patient can exercise in a chair 3 times a day. By flapping arms like a bird gently and raising legs outwards to the front.  If ambulatory, the patient can go for  walks for 30 minutes 3 times a week. Then increase the intensity and duration as tolerated.  Goal is to try to attain exercise frequency to 5 times a week.  If applicable: Best to perform resistance exercises (machines or  weights) 2 days a week and cardio type exercises 3 days per week.

## 2014-02-04 NOTE — Progress Notes (Signed)
Patient ID: Diana Pratt, female   DOB: 1946-02-10, 68 y.o.   MRN: [id number] SUBJECTIVE: CC: Chief Complaint  Patient presents with  . Diabetes    4 week follow up  . COPD    HPI:  Allergies flaring up. Has zyrtec OTC to take and has OTC flonase to use. Runny itchy eyes and runny nose. Planning to start back on a diet. Breathing is good.  Lipids were very high and she has had problems with statins but was able to tolerate the pravastatin and the zetia. This she restarted at the last visit.  Patient is here for follow up of hyperlipidemia: denies Headache;denies Chest Pain;denies weakness;denies Shortness of Breath and orthopnea;denies Visual changes;denies palpitations;denies cough;denies pedal edema;denies symptoms of TIA or stroke;deniesClaudication symptoms. admits to Compliance with medications; denies Problems with medications.   Patient is here for follow up of hypertension: denies Headache;deniesChest Pain;denies weakness;denies Shortness of Breath or Orthopnea;denies Visual changes;denies palpitations;denies cough;denies pedal edema;denies symptoms of TIA or stroke; admits to Compliance with medications. denies Problems with medications.   Past Medical History  Diagnosis Date  . Hypertension   . Diabetes mellitus   . Anxiety   . Hyperlipemia   . Depression   . Interstitial cystitis   . COPD (chronic obstructive pulmonary disease)   . Asthma    Past Surgical History  Procedure Laterality Date  . Abdominal hysterectomy  03/1984    partial   History   Social History  . Marital Status: Married    Spouse Name: N/A    Number of Children: N/A  . Years of Education: N/A   Occupational History  . Not on file.   Social History Main Topics  . Smoking status: Former Smoker    Quit date: 06/01/1994  . Smokeless tobacco: Never Used  . Alcohol Use: Yes     Comment: once a month  . Drug Use: No  . Sexual Activity: No   Other Topics Concern  . Not on file    Social History Narrative  . No narrative on file   Family History  Problem Relation Age of Onset  . Parkinsonism Mother    Current Outpatient Prescriptions on File Prior to Visit  Medication Sig Dispense Refill  . acetaminophen (TYLENOL) 325 MG tablet Take 650 mg by mouth every 6 (six) hours as needed. pain      . albuterol (PROAIR HFA) 108 (90 BASE) MCG/ACT inhaler Inhale 2 puffs into the lungs every 6 (six) hours as needed for shortness of breath.  18 g  1  . albuterol (PROVENTIL) (2.5 MG/3ML) 0.083% nebulizer solution Take 2.5 mg by nebulization every 6 (six) hours as needed. Shortness of breath       . aspirin 81 MG EC tablet Take 81 mg by mouth daily.        . budesonide-formoterol (SYMBICORT) 160-4.5 MCG/ACT inhaler Inhale 2 puffs into the lungs 2 (two) times daily.  2 Inhaler  0  . calcium carbonate 200 MG capsule Take 250 mg by mouth daily.       . Cholecalciferol (VITAMIN D3) 2000 UNITS capsule Take 2 capsules (4,000 Units total) by mouth daily.  60 capsule  11  . fenofibrate (TRICOR) 145 MG tablet Take 1 tablet (145 mg total) by mouth every Monday, Wednesday, and Friday.  30 tablet  11  . fluticasone (FLONASE) 50 MCG/ACT nasal spray Place 2 sprays into the nose daily as needed. Congestion       . ibuprofen (ADVIL,MOTRIN) 200  MG tablet Take 400 mg by mouth daily.      . Insulin Glargine (LANTUS SOLOSTAR) 100 UNIT/ML SOPN Inject 29 Units into the skin daily at 10 pm.  12 mL  11  . LANCETS ULTRA THIN MISC Inject 1 Device into the skin daily at 6 (six) AM.  100 each  3  . montelukast (SINGULAIR) 10 MG tablet Take 10 mg by mouth at bedtime.       . Multiple Vitamins-Minerals (CENTRUM SILVER PO) Take 1 tablet by mouth daily.       . pravastatin (PRAVACHOL) 10 MG tablet Take 1 tablet (10 mg total) by mouth daily.  30 tablet  3  . roflumilast (DALIRESP) 500 MCG TABS tablet Take 1 tablet (500 mcg total) by mouth daily.  28 tablet  0  . Cholecalciferol (VITAMIN D) 2000 UNITS CAPS Take  2,000 Units by mouth daily.       Marland Kitchen escitalopram (LEXAPRO) 10 MG tablet Take 1 tablet (10 mg total) by mouth daily.  30 tablet  5  . glipiZIDE (GLUCOTROL) 2.5 mg TABS tablet Take 0.5 tablets (2.5 mg total) by mouth daily.  30 tablet  11  . glucose blood test strip       . hydrochlorothiazide (HYDRODIURIL) 25 MG tablet Take 1 tablet (25 mg total) by mouth daily.  30 tablet  11  . hydroxypropyl methylcellulose (ISOPTO TEARS) 2.5 % ophthalmic solution Place 1 drop into both eyes daily as needed. for dry eyes        No current facility-administered medications on file prior to visit.   Allergies  Allergen Reactions  . Lipitor [Atorvastatin]     Myalgias   . Morphine And Related Other (See Comments)    Burning, itching  . Penicillins   . Simvastatin Other (See Comments)    myalgias  . Sulfa Antibiotics   . Zetia [Ezetimibe] Cough   Immunization History  Administered Date(s) Administered  . DTaP 01/04/2003   Prior to Admission medications   Medication Sig Start Date End Date Taking? Authorizing Provider  acetaminophen (TYLENOL) 325 MG tablet Take 650 mg by mouth every 6 (six) hours as needed. pain   Yes Historical Provider, MD  albuterol (PROAIR HFA) 108 (90 BASE) MCG/ACT inhaler Inhale 2 puffs into the lungs every 6 (six) hours as needed for shortness of breath. 12/25/13  Yes Vernie Shanks, MD  albuterol (PROVENTIL) (2.5 MG/3ML) 0.083% nebulizer solution Take 2.5 mg by nebulization every 6 (six) hours as needed. Shortness of breath    Yes Historical Provider, MD  amLODipine-valsartan (EXFORGE) 5-320 MG per tablet Take 1 tablet by mouth daily. 09/11/13  Yes Vernie Shanks, MD  aspirin 81 MG EC tablet Take 81 mg by mouth daily.     Yes Historical Provider, MD  budesonide-formoterol (SYMBICORT) 160-4.5 MCG/ACT inhaler Inhale 2 puffs into the lungs 2 (two) times daily. 12/25/13  Yes Vernie Shanks, MD  calcium carbonate 200 MG capsule Take 250 mg by mouth daily.    Yes Historical Provider, MD   Cholecalciferol (VITAMIN D) 2000 UNITS CAPS Take 2,000 Units by mouth daily.    Yes Historical Provider, MD  ibuprofen (ADVIL,MOTRIN) 200 MG tablet Take 400 mg by mouth daily.   Yes Historical Provider, MD  Insulin Glargine (LANTUS SOLOSTAR) 100 UNIT/ML SOPN Inject 29 Units into the skin daily at 10 pm. 05/29/13  Yes Vernie Shanks, MD  LANCETS ULTRA THIN MISC Inject 1 Device into the skin daily at 6 (six) AM.  09/11/13  Yes Vernie Shanks, MD  LORazepam (ATIVAN) 1 MG tablet Take 1 tablet (1 mg total) by mouth 2 (two) times daily as needed for anxiety. 12/25/13  Yes Vernie Shanks, MD  montelukast (SINGULAIR) 10 MG tablet Take 10 mg by mouth at bedtime.  12/26/12  Yes Historical Provider, MD  Multiple Vitamins-Minerals (CENTRUM SILVER PO) Take 1 tablet by mouth daily.    Yes Historical Provider, MD  pravastatin (PRAVACHOL) 10 MG tablet Take 1 tablet (10 mg total) by mouth daily. 01/07/14  Yes Vernie Shanks, MD  roflumilast (DALIRESP) 500 MCG TABS tablet Take 1 tablet (500 mcg total) by mouth daily. 12/25/13  Yes Vernie Shanks, MD  sitaGLIPtin (JANUVIA) 100 MG tablet Take 100 mg by mouth daily.   Yes Historical Provider, MD  Cholecalciferol (VITAMIN D3) 2000 UNITS capsule Take 2 capsules (4,000 Units total) by mouth daily. 01/07/14   Vernie Shanks, MD  escitalopram (LEXAPRO) 10 MG tablet Take 1 tablet (10 mg total) by mouth daily. 02/27/13   Vernie Shanks, MD  fenofibrate (TRICOR) 145 MG tablet Take 1 tablet (145 mg total) by mouth every Monday, Wednesday, and Friday. 09/11/13   Vernie Shanks, MD  fluticasone (FLONASE) 50 MCG/ACT nasal spray Place 2 sprays into the nose daily as needed. Congestion     Historical Provider, MD  glipiZIDE (GLUCOTROL) 2.5 mg TABS tablet Take 0.5 tablets (2.5 mg total) by mouth daily. 01/07/14   Vernie Shanks, MD  glucose blood test strip  06/17/13   Historical Provider, MD  hydrochlorothiazide (HYDRODIURIL) 25 MG tablet Take 1 tablet (25 mg total) by mouth daily. 09/11/13    Vernie Shanks, MD  hydroxypropyl methylcellulose (ISOPTO TEARS) 2.5 % ophthalmic solution Place 1 drop into both eyes daily as needed. for dry eyes     Historical Provider, MD     ROS: As above in the HPI. All other systems are stable or negative.  OBJECTIVE: APPEARANCE:  Patient in no acute distress.The patient appeared well nourished and normally developed. Acyanotic. Waist: VITAL SIGNS:BP 146/83  Pulse 77  Temp(Src) 97.3 F (36.3 C) (Oral)  Ht '5\' 3"'  (1.6 m)  Wt 193 lb 3.2 oz (87.635 kg)  BMI 34.23 kg/m2 Obese WF  SKIN: warm and  Dry without overt rashes, tattoos and scars  HEAD and Neck: without JVD, Head and scalp: normal Eyes:No scleral icterus. Fundi normal, eye movements normal.lacrimating  Ears: Auricle normal, canal normal, Tympanic membranes normal, insufflation normal. Nose: nasal congestion and rhinorrhea. Throat: normal Neck & thyroid: normal  CHEST & LUNGS: Chest wall: normal Lungs: Clear  CVS: Reveals the PMI to be normally located. Regular rhythm, First and Second Heart sounds are normal,  absence of murmurs, rubs or gallops. Peripheral vasculature: Radial pulses: normal Dorsal pedis pulses: normal Posterior pulses: normal  ABDOMEN:  Appearance: Obese Benign, no organomegaly, no masses, no Abdominal Aortic enlargement. No Guarding , no rebound. No Bruits. Bowel sounds: normal  RECTAL: N/A GU: N/A  EXTREMETIES: nonedematous.  MUSCULOSKELETAL:  Spine: normal Joints: intact  NEUROLOGIC: oriented to time,place and person; nonfocal. Strength is normal Sensory is normal Reflexes are normal Cranial Nerves are normal. Results for orders placed in visit on 12/25/13  CMP14+EGFR      Result Value Ref Range   Glucose 92  65 - 99 mg/dL   BUN 21  8 - 27 mg/dL   Creatinine, Ser 0.79  0.57 - 1.00 mg/dL   GFR calc non Af Amer 78  >  59 mL/min/1.73   GFR calc Af Amer 90  >59 mL/min/1.73   BUN/Creatinine Ratio 27 (*) 11 - 26   Sodium 144  134 -  144 mmol/L   Potassium 4.2  3.5 - 5.2 mmol/L   Chloride 104  97 - 108 mmol/L   CO2 22  18 - 29 mmol/L   Calcium 9.0  8.7 - 10.3 mg/dL   Total Protein 6.6  6.0 - 8.5 g/dL   Albumin 4.1  3.6 - 4.8 g/dL   Globulin, Total 2.5  1.5 - 4.5 g/dL   Albumin/Globulin Ratio 1.6  1.1 - 2.5   Total Bilirubin 0.4  0.0 - 1.2 mg/dL   Alkaline Phosphatase 64  39 - 117 IU/L   AST 13  0 - 40 IU/L   ALT 12  0 - 32 IU/L  NMR, LIPOPROFILE      Result Value Ref Range   LDL Particle Number [id number] (*) <1000 nmol/L   LDLC SERPL CALC-MCNC 192 (*) <100 mg/dL   HDL Cholesterol by NMR 38 (*) >=40 mg/dL   Triglycerides by NMR 171 (*) <150 mg/dL   Cholesterol 264 (*) <200 mg/dL   HDL Particle Number 22.3 (*) >=30.5 umol/L   Small LDL Particle Number 1952 (*) <=527 nmol/L   LDL Size 20.3 (*) >20.5 nm   LP-IR Score 70 (*) <=45  VITAMIN D 25 HYDROXY      Result Value Ref Range   Vit D, 25-Hydroxy 14.3 (*) 30.0 - 100.0 ng/mL  POCT GLYCOSYLATED HEMOGLOBIN (HGB A1C)      Result Value Ref Range   Hemoglobin A1C 5.8      ASSESSMENT: Type 2 diabetes mellitus  Hyperlipemia  COPD (chronic obstructive pulmonary disease)  HTN (hypertension)  Unspecified vitamin D deficiency  Depression with anxiety - Plan: LORazepam (ATIVAN) 1 MG tablet COPD stable Major problem is obesity BP is not at goal Will adjust medications. Will do labs next visit to check on the vit D level.  PLAN: DASH diet in the AVS discussed       Dr Paula Libra Recommendations  For nutrition information, I recommend books:  1).Eat to Live by Dr Excell Seltzer. 2).Prevent and Reverse Heart Disease by Dr Karl Luke. 3) Dr Janene Harvey Book:  Program to Reverse Diabetes  Exercise recommendations are:  If unable to walk, then the patient can exercise in a chair 3 times a day. By flapping arms like a bird gently and raising legs outwards to the front.  If ambulatory, the patient can go for walks for 30 minutes 3 times a week.  Then increase the intensity and duration as tolerated.  Goal is to try to attain exercise frequency to 5 times a week.  If applicable: Best to perform resistance exercises (machines or weights) 2 days a week and cardio type exercises 3 days per week.  No orders of the defined types were placed in this encounter.   Meds ordered this encounter  Medications  . LORazepam (ATIVAN) 1 MG tablet    Sig: Take 1 tablet (1 mg total) by mouth 2 (two) times daily as needed for anxiety.    Dispense:  60 tablet    Refill:  1  . sitaGLIPtin (JANUVIA) 100 MG tablet    Sig: Take 1 tablet (100 mg total) by mouth daily.    Dispense:  30 tablet    Refill:  5  . amLODipine-valsartan (EXFORGE) 10-320 MG per tablet    Sig: Take 1 tablet by mouth  daily.    Dispense:  30 tablet    Refill:  5   Medications Discontinued During This Encounter  Medication Reason  . amLODipine-valsartan (EXFORGE) 5-320 MG per tablet Dose change  . LORazepam (ATIVAN) 1 MG tablet Reorder  . sitaGLIPtin (JANUVIA) 100 MG tablet Reorder   Return in about 4 weeks (around 03/04/2014) for Recheck medical problems, recheck BP.  Keva Darty P. Jacelyn Grip, M.D.

## 2014-02-24 LAB — HM DIABETES EYE EXAM

## 2014-03-05 ENCOUNTER — Ambulatory Visit: Payer: Medicare Other | Admitting: Family Medicine

## 2014-04-19 ENCOUNTER — Other Ambulatory Visit: Payer: Self-pay | Admitting: *Deleted

## 2014-04-19 DIAGNOSIS — F411 Generalized anxiety disorder: Secondary | ICD-10-CM

## 2014-04-19 MED ORDER — ESCITALOPRAM OXALATE 10 MG PO TABS: 10.0000 mg | ORAL_TABLET | Freq: Every day | ORAL | Status: DC

## 2014-05-31 ENCOUNTER — Encounter (HOSPITAL_COMMUNITY): Payer: Self-pay | Admitting: Emergency Medicine

## 2014-05-31 ENCOUNTER — Emergency Department (HOSPITAL_COMMUNITY)
Admission: EM | Admit: 2014-05-31 | Discharge: 2014-05-31 | Disposition: A | Discharge status: Home / Self Care | Payer: Medicare Other | Attending: Emergency Medicine | Admitting: Emergency Medicine

## 2014-05-31 ENCOUNTER — Emergency Department (HOSPITAL_COMMUNITY): Payer: Medicare Other

## 2014-05-31 DIAGNOSIS — J449 Chronic obstructive pulmonary disease, unspecified: Secondary | ICD-10-CM | POA: Insufficient documentation

## 2014-05-31 DIAGNOSIS — F411 Generalized anxiety disorder: Secondary | ICD-10-CM | POA: Insufficient documentation

## 2014-05-31 DIAGNOSIS — E119 Type 2 diabetes mellitus without complications: Secondary | ICD-10-CM | POA: Insufficient documentation

## 2014-05-31 DIAGNOSIS — F329 Major depressive disorder, single episode, unspecified: Secondary | ICD-10-CM | POA: Diagnosis not present

## 2014-05-31 DIAGNOSIS — M25519 Pain in unspecified shoulder: Secondary | ICD-10-CM | POA: Insufficient documentation

## 2014-05-31 DIAGNOSIS — I10 Essential (primary) hypertension: Secondary | ICD-10-CM | POA: Diagnosis not present

## 2014-05-31 DIAGNOSIS — Z880 Allergy status to penicillin: Secondary | ICD-10-CM | POA: Diagnosis not present

## 2014-05-31 DIAGNOSIS — Z87891 Personal history of nicotine dependence: Secondary | ICD-10-CM | POA: Diagnosis not present

## 2014-05-31 DIAGNOSIS — Z7982 Long term (current) use of aspirin: Secondary | ICD-10-CM | POA: Insufficient documentation

## 2014-05-31 DIAGNOSIS — R1012 Left upper quadrant pain: Secondary | ICD-10-CM | POA: Diagnosis present

## 2014-05-31 DIAGNOSIS — Z8742 Personal history of other diseases of the female genital tract: Secondary | ICD-10-CM | POA: Insufficient documentation

## 2014-05-31 DIAGNOSIS — Z794 Long term (current) use of insulin: Secondary | ICD-10-CM | POA: Diagnosis not present

## 2014-05-31 DIAGNOSIS — J4489 Other specified chronic obstructive pulmonary disease: Secondary | ICD-10-CM | POA: Insufficient documentation

## 2014-05-31 DIAGNOSIS — F3289 Other specified depressive episodes: Secondary | ICD-10-CM | POA: Insufficient documentation

## 2014-05-31 DIAGNOSIS — Z79899 Other long term (current) drug therapy: Secondary | ICD-10-CM | POA: Insufficient documentation

## 2014-05-31 DIAGNOSIS — E785 Hyperlipidemia, unspecified: Secondary | ICD-10-CM | POA: Insufficient documentation

## 2014-05-31 LAB — CBC WITH DIFFERENTIAL/PLATELET
Basophils Absolute: 0 10*3/uL (ref 0.0–0.1)
Basophils Relative: 0 % (ref 0–1)
EOS ABS: 0.1 10*3/uL (ref 0.0–0.7)
EOS PCT: 2 % (ref 0–5)
HEMATOCRIT: 42.5 % (ref 36.0–46.0)
Hemoglobin: 14.7 g/dL (ref 12.0–15.0)
LYMPHS ABS: 1.9 10*3/uL (ref 0.7–4.0)
Lymphocytes Relative: 41 % (ref 12–46)
MCH: 31 pg (ref 26.0–34.0)
MCHC: 34.6 g/dL (ref 30.0–36.0)
MCV: 89.7 fL (ref 78.0–100.0)
MONOS PCT: 6 % (ref 3–12)
Monocytes Absolute: 0.3 10*3/uL (ref 0.1–1.0)
Neutro Abs: 2.4 10*3/uL (ref 1.7–7.7)
Neutrophils Relative %: 51 % (ref 43–77)
Platelets: 200 10*3/uL (ref 150–400)
RBC: 4.74 MIL/uL (ref 3.87–5.11)
RDW: 13 % (ref 11.5–15.5)
WBC: 4.7 10*3/uL (ref 4.0–10.5)

## 2014-05-31 LAB — COMPREHENSIVE METABOLIC PANEL
ALBUMIN: 4.2 g/dL (ref 3.5–5.2)
ALK PHOS: 62 U/L (ref 39–117)
ALT: 63 U/L — ABNORMAL HIGH (ref 0–35)
AST: 45 U/L — ABNORMAL HIGH (ref 0–37)
Anion gap: 16 — ABNORMAL HIGH (ref 5–15)
BILIRUBIN TOTAL: 0.5 mg/dL (ref 0.3–1.2)
BUN: 15 mg/dL (ref 6–23)
CHLORIDE: 100 meq/L (ref 96–112)
CO2: 24 mEq/L (ref 19–32)
Calcium: 9.5 mg/dL (ref 8.4–10.5)
Creatinine, Ser: 0.83 mg/dL (ref 0.50–1.10)
GFR calc Af Amer: 82 mL/min — ABNORMAL LOW (ref >90)
GFR calc non Af Amer: 71 mL/min — ABNORMAL LOW (ref >90)
GLUCOSE: 186 mg/dL — AB (ref 70–99)
POTASSIUM: 3.5 meq/L — AB (ref 3.7–5.3)
Sodium: 140 mEq/L (ref 137–147)
Total Protein: 7.8 g/dL (ref 6.0–8.3)

## 2014-05-31 LAB — URINALYSIS, ROUTINE W REFLEX MICROSCOPIC
Glucose, UA: NEGATIVE mg/dL
HGB URINE DIPSTICK: NEGATIVE
KETONES UR: NEGATIVE mg/dL
Leukocytes, UA: NEGATIVE
NITRITE: NEGATIVE
PH: 5 (ref 5.0–8.0)
Protein, ur: NEGATIVE mg/dL
Specific Gravity, Urine: >1.03 — ABNORMAL HIGH (ref 1.005–1.030)
Urobilinogen, UA: 0.2 mg/dL (ref 0.0–1.0)

## 2014-05-31 MED ORDER — HYDROCODONE-ACETAMINOPHEN 5-325 MG PO TABS: 1.0000 | ORAL_TABLET | Freq: Four times a day (QID) | ORAL | Status: DC | PRN

## 2014-05-31 MED ORDER — LORAZEPAM 1 MG PO TABS: ORAL_TABLET | ORAL | Status: DC

## 2014-05-31 MED ORDER — HYDROMORPHONE HCL PF 1 MG/ML IJ SOLN
0.5000 mg | Freq: Once | INTRAMUSCULAR | Status: AC
  Administered 2014-05-31: 0.5 mg via INTRAVENOUS
  Filled 2014-05-31: qty 1

## 2014-05-31 MED ORDER — PROMETHAZINE HCL 25 MG PO TABS: 25.0000 mg | ORAL_TABLET | Freq: Four times a day (QID) | ORAL | Status: DC | PRN

## 2014-05-31 MED ORDER — IOHEXOL 300 MG/ML  SOLN
50.0000 mL | Freq: Once | INTRAMUSCULAR | Status: AC | PRN
  Administered 2014-05-31: 50 mL via ORAL

## 2014-05-31 MED ORDER — LORAZEPAM 2 MG/ML IJ SOLN
0.5000 mg | Freq: Once | INTRAMUSCULAR | Status: AC
  Administered 2014-05-31: 0.5 mg via INTRAVENOUS
  Filled 2014-05-31: qty 1

## 2014-05-31 MED ORDER — IOHEXOL 300 MG/ML  SOLN
100.0000 mL | Freq: Once | INTRAMUSCULAR | Status: AC | PRN
  Administered 2014-05-31: 100 mL via INTRAVENOUS

## 2014-05-31 NOTE — ED Provider Notes (Signed)
CSN: [id number]     Arrival date & time 05/31/14  6283 History  This chart was scribed for Maudry Diego, MD by Starleen Arms, ED Scribe. This patient was seen in room APA08/APA08 and the patient's care was started at 7:41 AM.    Chief Complaint  Patient presents with  . Abdominal Pain    Patient is a 68 y.o. female presenting with abdominal pain. The history is provided by the patient. No language interpreter was used.  Abdominal Pain Pain location:  RUQ Pain quality: cramping   Pain radiates to:  Does not radiate Pain severity:  Moderate Onset quality:  Gradual Duration:  12 weeks Timing:  Constant Relieved by:  Nothing Worsened by:  Nothing tried Associated symptoms: no chest pain, no cough, no diarrhea, no fatigue and no hematuria     HPI Comments: Diana Pratt is a 68 y.o. female with a history of HTN and DM who presents to the Emergency Department complaining of constant, moderate, right-sided abdominal pain that began several months ago and is described as cramping.  Patient has been seen by her PCP Dr. Jacelyn Grip 3 weeks ago for similar complaints and was treated for a UTI but is unsure if that was her diagnosis.  Patient also complains of minimal right shoulder pain and a generalized ache over the past several months that has made it difficult for her to sleep.  Patient denies fever, chills.  PCP: Jacelyn Grip     Past Medical History  Diagnosis Date  . Hypertension   . Diabetes mellitus   . Anxiety   . Hyperlipemia   . Depression   . Interstitial cystitis   . COPD (chronic obstructive pulmonary disease)   . Asthma    Past Surgical History  Procedure Laterality Date  . Abdominal hysterectomy  03/1984    partial   Family History  Problem Relation Age of Onset  . Parkinsonism Mother    History  Substance Use Topics  . Smoking status: Former Smoker    Quit date: 06/01/1994  . Smokeless tobacco: Never Used  . Alcohol Use: Yes     Comment: once a month   OB History    Grav Para Term Preterm Abortions TAB SAB Ect Mult Living   2 1 1  1     1      Review of Systems  Constitutional: Negative for appetite change and fatigue.  HENT: Negative for congestion, ear discharge and sinus pressure.   Eyes: Negative for discharge.  Respiratory: Negative for cough.   Cardiovascular: Negative for chest pain.  Gastrointestinal: Positive for abdominal pain. Negative for diarrhea.  Genitourinary: Negative for frequency and hematuria.  Musculoskeletal: Positive for arthralgias (Right shoulder). Negative for back pain.  Skin: Negative for rash.  Neurological: Negative for seizures and headaches.  Psychiatric/Behavioral: Negative for hallucinations.      Allergies  Lipitor; Morphine and related; Penicillins; Simvastatin; Sulfa antibiotics; and Zetia  Home Medications   Prior to Admission medications   Medication Sig Start Date End Date Taking? Authorizing Provider  acetaminophen (TYLENOL) 325 MG tablet Take 650 mg by mouth every 6 (six) hours as needed. pain    Historical Provider, MD  albuterol (PROAIR HFA) 108 (90 BASE) MCG/ACT inhaler Inhale 2 puffs into the lungs every 6 (six) hours as needed for shortness of breath. 12/25/13   Vernie Shanks, MD  albuterol (PROVENTIL) (2.5 MG/3ML) 0.083% nebulizer solution Take 2.5 mg by nebulization every 6 (six) hours as needed. Shortness of breath  Historical Provider, MD  amLODipine-valsartan (EXFORGE) 10-320 MG per tablet Take 1 tablet by mouth daily. 02/04/14   Vernie Shanks, MD  aspirin 81 MG EC tablet Take 81 mg by mouth daily.      Historical Provider, MD  budesonide-formoterol (SYMBICORT) 160-4.5 MCG/ACT inhaler Inhale 2 puffs into the lungs 2 (two) times daily. 12/25/13   Vernie Shanks, MD  calcium carbonate 200 MG capsule Take 250 mg by mouth daily.     Historical Provider, MD  Cholecalciferol (VITAMIN D) 2000 UNITS CAPS Take 2,000 Units by mouth daily.     Historical Provider, MD  Cholecalciferol (VITAMIN D3)  2000 UNITS capsule Take 2 capsules (4,000 Units total) by mouth daily. 01/07/14   Vernie Shanks, MD  escitalopram (LEXAPRO) 10 MG tablet Take 1 tablet (10 mg total) by mouth daily. 04/19/14   Chipper Herb, MD  fenofibrate (TRICOR) 145 MG tablet Take 1 tablet (145 mg total) by mouth every Monday, Wednesday, and Friday. 09/11/13   Vernie Shanks, MD  fluticasone (FLONASE) 50 MCG/ACT nasal spray Place 2 sprays into the nose daily as needed. Congestion     Historical Provider, MD  glipiZIDE (GLUCOTROL) 2.5 mg TABS tablet Take 0.5 tablets (2.5 mg total) by mouth daily. 01/07/14   Vernie Shanks, MD  glucose blood test strip  06/17/13   Historical Provider, MD  hydrochlorothiazide (HYDRODIURIL) 25 MG tablet Take 1 tablet (25 mg total) by mouth daily. 09/11/13   Vernie Shanks, MD  hydroxypropyl methylcellulose (ISOPTO TEARS) 2.5 % ophthalmic solution Place 1 drop into both eyes daily as needed. for dry eyes     Historical Provider, MD  ibuprofen (ADVIL,MOTRIN) 200 MG tablet Take 400 mg by mouth daily.    Historical Provider, MD  Insulin Glargine (LANTUS SOLOSTAR) 100 UNIT/ML SOPN Inject 29 Units into the skin daily at 10 pm. 05/29/13   Vernie Shanks, MD  LANCETS ULTRA THIN MISC Inject 1 Device into the skin daily at 6 (six) AM. 09/11/13   Vernie Shanks, MD  LORazepam (ATIVAN) 1 MG tablet Take 1 tablet (1 mg total) by mouth 2 (two) times daily as needed for anxiety. 02/04/14   Vernie Shanks, MD  montelukast (SINGULAIR) 10 MG tablet Take 10 mg by mouth at bedtime.  12/26/12   Historical Provider, MD  Multiple Vitamins-Minerals (CENTRUM SILVER PO) Take 1 tablet by mouth daily.     Historical Provider, MD  pravastatin (PRAVACHOL) 10 MG tablet Take 1 tablet (10 mg total) by mouth daily. 01/07/14   Vernie Shanks, MD  roflumilast (DALIRESP) 500 MCG TABS tablet Take 1 tablet (500 mcg total) by mouth daily. 12/25/13   Vernie Shanks, MD  sitaGLIPtin (JANUVIA) 100 MG tablet Take 1 tablet (100 mg total) by mouth daily.  02/04/14   Vernie Shanks, MD   BP 170/77  Pulse 94  Temp(Src) 97.9 F (36.6 C) (Oral)  Resp 16  Ht 5\' 4"  (1.626 m)  Wt 195 lb (88.451 kg)  BMI 33.46 kg/m2  SpO2 98% Physical Exam  Nursing note and vitals reviewed. Constitutional: She is oriented to person, place, and time. She appears well-developed.  HENT:  Head: Normocephalic.  Eyes: Conjunctivae and EOM are normal. No scleral icterus.  Neck: Neck supple. No thyromegaly present.  Cardiovascular: Normal rate and regular rhythm.  Exam reveals no gallop and no friction rub.   No murmur heard. Pulmonary/Chest: No stridor. She has no wheezes. She has no rales. She exhibits  no tenderness.  Abdominal: She exhibits no distension. There is tenderness. There is no rebound.  Moderate RUQ tenderness.   Musculoskeletal: Normal range of motion. She exhibits tenderness. She exhibits no edema.   Minimal right shoulder tenderness  Lymphadenopathy:    She has no cervical adenopathy.  Neurological: She is oriented to person, place, and time. She exhibits normal muscle tone. Coordination normal.  Skin: No rash noted. No erythema.  Psychiatric: She has a normal mood and affect. Her behavior is normal.    ED Course  Procedures (including critical care time)  DIAGNOSTIC STUDIES: Oxygen Saturation is 98% on RA, normal by my interpretation.    COORDINATION OF CARE:  7:46 AM Discussed plan to obtain labs and imaging.  Patient acknowledges and agrees with plan.    Labs Review Labs Reviewed  COMPREHENSIVE METABOLIC PANEL - Abnormal; Notable for the following:    Potassium 3.5 (*)    Glucose, Bld 186 (*)    AST 45 (*)    ALT 63 (*)    GFR calc non Af Amer 71 (*)    GFR calc Af Amer 82 (*)    Anion gap 16 (*)    All other components within normal limits  CBC WITH DIFFERENTIAL  URINALYSIS, ROUTINE W REFLEX MICROSCOPIC    Imaging Review No results found.   EKG Interpretation None      MDM   Final diagnoses:  None    The chart  was scribed for me under my direct supervision.  I personally performed the history, physical, and medical decision making and all procedures in the evaluation of this patient.Maudry Diego, MD 05/31/14 1500

## 2014-05-31 NOTE — Discharge Instructions (Signed)
Follow up with dr. Oneida Alar or another gi md in 2-3 weeks

## 2014-05-31 NOTE — ED Notes (Signed)
EDP aware pt c/o anxiety and stomach burning

## 2014-05-31 NOTE — ED Notes (Signed)
Pt drinking contrast. Nad. Pillow given

## 2014-05-31 NOTE — ED Notes (Signed)
MD at bedside. 

## 2014-05-31 NOTE — ED Notes (Addendum)
Pt states lower abdominal pain, worse to RLQ. Pain is described as "menstrual cramps" x several weeks. Pt also states pain to right shoulder for several weeks. See at Urgent Care and states she was treated for a UTI, which she states she did not have a UTI. Pt states she does not have a PCP. Pt also states she is having a lot of anxiety this morning and that her blood sugar was up to 218 this morning at home. States she ran out of Ativan 3 days ago.

## 2014-06-15 ENCOUNTER — Telehealth: Payer: Self-pay | Admitting: Family Medicine

## 2014-06-15 DIAGNOSIS — F418 Other specified anxiety disorders: Secondary | ICD-10-CM

## 2014-06-15 NOTE — Telephone Encounter (Signed)
This is okay to refill x1

## 2014-06-16 MED ORDER — LORAZEPAM 1 MG PO TABS: 1.0000 mg | ORAL_TABLET | Freq: Two times a day (BID) | ORAL | Status: DC | PRN

## 2014-06-16 NOTE — Telephone Encounter (Signed)
Called into [facility].  

## 2014-07-01 ENCOUNTER — Ambulatory Visit (INDEPENDENT_AMBULATORY_CARE_PROVIDER_SITE_OTHER): Payer: Medicare Other | Admitting: Gastroenterology

## 2014-07-01 ENCOUNTER — Other Ambulatory Visit: Payer: Self-pay | Admitting: Gastroenterology

## 2014-07-01 ENCOUNTER — Encounter: Payer: Self-pay | Admitting: Gastroenterology

## 2014-07-01 VITALS — BP 173/78 | HR 87 | Temp 97.6°F | Ht 64.0 in | Wt 202.6 lb

## 2014-07-01 DIAGNOSIS — R7401 Elevation of levels of liver transaminase levels: Secondary | ICD-10-CM | POA: Insufficient documentation

## 2014-07-01 DIAGNOSIS — R740 Nonspecific elevation of levels of transaminase and lactic acid dehydrogenase [LDH]: Secondary | ICD-10-CM

## 2014-07-01 DIAGNOSIS — R7402 Elevation of levels of lactic acid dehydrogenase (LDH): Secondary | ICD-10-CM

## 2014-07-01 DIAGNOSIS — R1032 Left lower quadrant pain: Secondary | ICD-10-CM

## 2014-07-01 DIAGNOSIS — R52 Pain, unspecified: Secondary | ICD-10-CM

## 2014-07-01 DIAGNOSIS — R1031 Right lower quadrant pain: Secondary | ICD-10-CM | POA: Insufficient documentation

## 2014-07-01 DIAGNOSIS — R1011 Right upper quadrant pain: Secondary | ICD-10-CM

## 2014-07-01 DIAGNOSIS — G8929 Other chronic pain: Secondary | ICD-10-CM

## 2014-07-01 MED ORDER — PEG 3350-KCL-NA BICARB-NACL 420 G PO SOLR: 4000.0000 mL | ORAL | Status: DC

## 2014-07-01 NOTE — Assessment & Plan Note (Addendum)
SX MOST LIKELY DUE TO IBS, INTERSTITIAL CYSTITIS, DIABETIC ENTEROPATHY, LESS LIKELY COLITIS.  FULL LIQUID SEP 13 TAKE HALF OF DOSE  OF HYDROCHLOROTHIAZIDE AND LANTUS ON SEP 13.  HOLD JANUVIA SEP 14 TCS SEP 14-DISCUSSED PROCEDURE, BENEFITS, AND RISKS. OPV IN 4 MOS

## 2014-07-01 NOTE — Assessment & Plan Note (Signed)
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  AIH & VIRAL SEROLOGIES LOSE 10 LBS LOW FAT/DIABETIC DIET OPV IN 4 MOS

## 2014-07-01 NOTE — Patient Instructions (Signed)
FULL LIQUID SEP 13. SEE INFO BELOW.  TAKE HALF OF DOSE  OF HYDROCHLOROTHIAZIDE AND LANTUS ON SEP 13.   HOLD JANUVIA SEP 14.  COLONOSCOPY ON SEP 14.  Full Liquid Diet A high-calorie, high-protein supplement should be used to meet your nutritional requirements when the full liquid diet is continued for more than 2 or 3 days. If this diet is to be used for an extended period of time (more than 7 days), a multivitamin should be considered.  Breads and Starches  Allowed: None are allowed except crackers WHOLE OR pureed (made into a thick, smooth soup) in soup.  Avoid: Any others.    Potatoes/Pasta/Rice  Allowed: ANY ITEM AS A SOUP OR SMALL PLATE OF MASHED POTATOES OR RICE.       Vegetables  Allowed: Strained tomato or vegetable juice. Vegetables pureed in soup.   Avoid: Any others.    Fruit  Allowed: Any strained fruit juices and fruit drinks. Include 1 serving of citrus or vitamin C-enriched fruit juice daily.   Avoid: Any others.  Meat and Meat Substitutes  Allowed: Egg  Avoid: Any meat, fish, or fowl. All cheese.  Milk  Allowed: Milk beverages, including milk shakes and instant breakfast mixes. Smooth yogurt.   Avoid: Any others. Avoid dairy products if not tolerated.    Soups and Combination Foods  Allowed: Broth, strained cream soups. Strained, broth-based soups.   Avoid: Any others.    Desserts and Sweets  Allowed: flavored gelatin, tapioca, plain ice cream, sherbet, smooth pudding, junket, fruit ices, frozen ice pops, pudding pops,, frozen fudge pops, chocolate syrup. Sugar, honey, jelly, syrup.   Avoid: Any others.  Fats and Oils  Allowed: Margarine, butter, cream, sour cream, oils.   Avoid: Any others.  Beverages  Allowed: All.   Avoid: None.  Condiments  Allowed: Iodized salt, pepper, spices, flavorings. Cocoa powder.   Avoid: Any others.    SAMPLE MEAL PLAN Breakfast   cup orange juice.   1 cup SOUP  1 cup  milk.   1 cup  beverage (coffee or tea).   Cream or sugar, if desired.    Midmorning Snack  2 SCRAMBLED OR HARD BOILED EGG   Lunch  1 cup cream soup.    cup fruit juice.   1 cup milk.    cup custard.   1 cup beverage (coffee or tea).   Cream or sugar, if desired.    Midafternoon Snack  1 cup milk shake.  Dinner  1 cup cream soup.    cup fruit juice.   1 cup milk.    cup pudding.   1 cup beverage (coffee or tea).   Cream or sugar, if desired.  Evening Snack  1 cup supplement.  To increase calories, add sugar, cream, butter, or margarine if possible. Nutritional supplements will also increase the total calories.

## 2014-07-01 NOTE — Progress Notes (Signed)
Cc to pcp °

## 2014-07-01 NOTE — Progress Notes (Signed)
Subjective:    Patient ID: Diana Pratt, female    DOB: 03-08-1946, 68 y.o.   MRN: [id number]  HPI HYSTERECTOMY IN 1985. HAS BEEN HAVING BURNING PAIN.CARAMPY IN LOWER ABDOMEN. MAY HAVE DIARRHEA OFF AND ON BUT THINKS IT MAY BE DUE TO MEDS. NOT EVERY DAY.  NEVER HAD UPPER ENDOSCOPY. TCS 2005 DUE TO TCS-NL. HAPPEN 2-3X/WEEK AND GOES AND NO PAIN. UNDER A LOT OF STRESS-CARING FOR AILING AUNT. BMs: 3X/WEEK. MAY BE ASSOCIATED WITH BLOATING/GAS. CAN BE RELATED TO WHAT SHE EATS. SX BEEN ABOUT THE SAME. DIAGNOSED WITH IBS AFTER HER HSY. OCCASIONAL HEARTBURN RX: TUMS. NO WEIGHT LOSS OR CHANGE IN APPETITE. MAY HAVE DYSURIA BUT NO HEMATURIA. TOOK ABX FOR UTI AND GOT A YEAST INFECTION AND GETS FLARES IN VAGINA; AREA.   PT DENIES FEVER, CHILLS, HEMATOCHEZIA,  nausea, vomiting, melena, CHEST PAIN, SHORTNESS OF BREATH, CHANGE IN BOWEL IN HABITS, problems swallowing, OR problems with sedation.  Past Medical History  Diagnosis Date  . Hypertension   . Diabetes mellitus   . Anxiety   . Hyperlipemia   . Depression   . Interstitial cystitis   . COPD (chronic obstructive pulmonary disease)   . Asthma    Past Surgical History  Procedure Laterality Date  . Abdominal hysterectomy  03/1984    partial   Allergies  Allergen Reactions  . Lipitor [Atorvastatin]     Myalgias   . Morphine And Related Other (See Comments)    Burning, itching  . Penicillins   . Simvastatin Other (See Comments)    myalgias  . Sulfa Antibiotics   . Zetia [Ezetimibe] Cough    Current Outpatient Prescriptions  Medication Sig Dispense Refill  . acetaminophen (TYLENOL) 325 MG tablet Take 650 mg by mouth every 6 (six) hours as needed. pain    . albuterol (PROAIR HFA) 108 (90 BASE) MCG/ACT inhaler Inhale 2 puffs into the lungs every 6 (six) hours as needed for shortness of breath.    Marland Kitchen albuterol (PROVENTIL) (2.5 MG/3ML) 0.083% nebulizer solution Take 2.5 mg by nebulization every 6 (six) hours as needed. Shortness of breath       . amLODipine-valsartan (EXFORGE) 10-320 MG per tablet Take 1 tablet by mouth daily.    Marland Kitchen aspirin 81 MG EC tablet Take 81 mg by mouth daily.      . budesonide-formoterol (SYMBICORT) 160-4.5 MCG/ACT inhaler Inhale 2 puffs into the lungs 2 (two) times daily.    . calcium carbonate 200 MG capsule Take 250 mg by mouth daily.     . Cholecalciferol (VITAMIN D3) 2000 UNITS capsule Take 2 capsules (4,000 Units total) by mouth daily.    Marland Kitchen escitalopram (LEXAPRO) 10 MG tablet Take 1 tablet (10 mg total) by mouth daily.    . fluticasone (FLONASE) 50 MCG/ACT nasal spray Place 2 sprays into the nose daily as needed. Congestion     . hydrochlorothiazide (HYDRODIURIL) 25 MG tablet Take 1 tablet (25 mg total) by mouth daily.    .      . hydroxypropyl methylcellulose (ISOPTO TEARS) 2.5 % ophthalmic solution Place 1 drop into both eyes daily as needed. for dry eyes     . ibuprofen (ADVIL,MOTRIN) 200 MG tablet Take 400 mg by mouth daily.    . Insulin Glargine (LANTUS SOLOSTAR) 100 UNIT/ML SOPN Inject 29 Units into the skin daily at 10 pm.    . LORazepam (ATIVAN) 1 MG tablet Take 1 tablet (1 mg total) by mouth 2 (two) times daily as needed for anxiety.    Marland Kitchen  montelukast (SINGULAIR) 10 MG tablet Take 10 mg by mouth at bedtime.     . Multiple Vitamins-Minerals (CENTRUM SILVER PO) Take 1 tablet by mouth daily.     . promethazine (PHENERGAN) 25 MG tablet Take 1 tablet (25 mg total) by mouth every 6 (six) hours as needed for nausea or vomiting. As needed   . sitaGLIPtin (JANUVIA) 100 MG tablet Take 1 tablet (100 mg total) by mouth daily.      Review of Systems PER HPI OTHERWISE ALL SYSTEMS ARE NEGATIVE.     Objective:   Physical Exam  Vitals reviewed. Constitutional: She is oriented to person, place, and time. She appears well-developed and well-nourished. No distress.  HENT:  Head: Normocephalic and atraumatic.  Mouth/Throat: Oropharynx is clear and moist. No oropharyngeal exudate.  Eyes: Pupils are equal,  round, and reactive to light. No scleral icterus.  Neck: Normal range of motion. Neck supple.  Cardiovascular: Normal rate, regular rhythm and normal heart sounds.   Pulmonary/Chest: Effort normal and breath sounds normal. No respiratory distress.  Abdominal: Soft. Bowel sounds are normal. She exhibits no distension. There is no tenderness.  Musculoskeletal: She exhibits no edema.  Lymphadenopathy:    She has no cervical adenopathy.  Neurological: She is alert and oriented to person, place, and time.  NO FOCAL DEFICITS   Psychiatric: She has a normal mood and affect.          Assessment & Plan:

## 2014-07-06 LAB — IGG, IGA, IGM
IGA: 248 mg/dL (ref 69–380)
IGG (IMMUNOGLOBIN G), SERUM: 818 mg/dL (ref 690–1700)
IGM, SERUM: 83 mg/dL (ref 52–322)

## 2014-07-06 LAB — ANA: Anti Nuclear Antibody(ANA): NEGATIVE

## 2014-07-06 LAB — HEPATITIS A ANTIBODY, TOTAL: HEP A TOTAL AB: REACTIVE — AB

## 2014-07-06 LAB — ANTI-SMOOTH MUSCLE ANTIBODY, IGG: SMOOTH MUSCLE AB: 4 U (ref <20)

## 2014-07-06 LAB — HEPATITIS C ANTIBODY: HCV AB: NEGATIVE

## 2014-07-06 LAB — HEPATITIS B SURFACE ANTIGEN: Hepatitis B Surface Ag: NEGATIVE

## 2014-07-08 ENCOUNTER — Encounter (HOSPITAL_COMMUNITY): Payer: Self-pay | Admitting: Pharmacy Technician

## 2014-07-15 NOTE — Progress Notes (Signed)
PATIENT NIC'D FOR December

## 2014-07-15 NOTE — Progress Notes (Signed)
PLEASE CALL PT. HER TEST FOR VIRAL HEPATITIS AND AUTOIMMUNE HEPATITIS ARE NEGATIVE. SHE HAS HAD HEPATITIS A BEFORE BUT T IS NOT A CHRONIC PROBLEM. HER LIVER ENZYMES ARE UP BECAUSE SHE IS CARRYING TOO MUCH WEIGHT. SHE HAS NON-ALCOHOLIC STEATO-HEPATITSI(NASH FOR SHORT).   CONTINUE YOUR WEIGHT LOSS EFFORTS. LOSE 10 LBS  STRICTLY ADHERE TO A DIABETIC DIET  OPV IN DEC 2015 E30 NASH/ABD PAIN

## 2014-07-15 NOTE — Progress Notes (Signed)
Pt is aware of results. 

## 2014-07-19 ENCOUNTER — Encounter (HOSPITAL_COMMUNITY)
Admission: RE | Disposition: A | Discharge status: Home / Self Care | Payer: Self-pay | Source: Ambulatory Visit | Attending: Gastroenterology

## 2014-07-19 ENCOUNTER — Encounter (HOSPITAL_COMMUNITY): Payer: Self-pay | Admitting: *Deleted

## 2014-07-19 ENCOUNTER — Ambulatory Visit (HOSPITAL_COMMUNITY)
Admission: RE | Admit: 2014-07-19 | Discharge: 2014-07-19 | Disposition: A | Discharge status: Home / Self Care | Payer: Medicare Other | Source: Ambulatory Visit | Attending: Gastroenterology | Admitting: Gastroenterology

## 2014-07-19 DIAGNOSIS — D128 Benign neoplasm of rectum: Secondary | ICD-10-CM | POA: Insufficient documentation

## 2014-07-19 DIAGNOSIS — E785 Hyperlipidemia, unspecified: Secondary | ICD-10-CM | POA: Diagnosis not present

## 2014-07-19 DIAGNOSIS — Q438 Other specified congenital malformations of intestine: Secondary | ICD-10-CM | POA: Insufficient documentation

## 2014-07-19 DIAGNOSIS — D126 Benign neoplasm of colon, unspecified: Secondary | ICD-10-CM | POA: Diagnosis not present

## 2014-07-19 DIAGNOSIS — R197 Diarrhea, unspecified: Secondary | ICD-10-CM | POA: Diagnosis not present

## 2014-07-19 DIAGNOSIS — R740 Nonspecific elevation of levels of transaminase and lactic acid dehydrogenase [LDH]: Secondary | ICD-10-CM

## 2014-07-19 DIAGNOSIS — Z794 Long term (current) use of insulin: Secondary | ICD-10-CM | POA: Insufficient documentation

## 2014-07-19 DIAGNOSIS — I10 Essential (primary) hypertension: Secondary | ICD-10-CM | POA: Insufficient documentation

## 2014-07-19 DIAGNOSIS — R1032 Left lower quadrant pain: Secondary | ICD-10-CM

## 2014-07-19 DIAGNOSIS — R198 Other specified symptoms and signs involving the digestive system and abdomen: Secondary | ICD-10-CM | POA: Diagnosis not present

## 2014-07-19 DIAGNOSIS — Z79899 Other long term (current) drug therapy: Secondary | ICD-10-CM | POA: Insufficient documentation

## 2014-07-19 DIAGNOSIS — R1011 Right upper quadrant pain: Secondary | ICD-10-CM

## 2014-07-19 DIAGNOSIS — R7401 Elevation of levels of liver transaminase levels: Secondary | ICD-10-CM

## 2014-07-19 DIAGNOSIS — E119 Type 2 diabetes mellitus without complications: Secondary | ICD-10-CM | POA: Diagnosis not present

## 2014-07-19 DIAGNOSIS — F341 Dysthymic disorder: Secondary | ICD-10-CM | POA: Insufficient documentation

## 2014-07-19 DIAGNOSIS — J449 Chronic obstructive pulmonary disease, unspecified: Secondary | ICD-10-CM | POA: Insufficient documentation

## 2014-07-19 DIAGNOSIS — J4489 Other specified chronic obstructive pulmonary disease: Secondary | ICD-10-CM | POA: Insufficient documentation

## 2014-07-19 DIAGNOSIS — K648 Other hemorrhoids: Secondary | ICD-10-CM | POA: Insufficient documentation

## 2014-07-19 DIAGNOSIS — D129 Benign neoplasm of anus and anal canal: Secondary | ICD-10-CM

## 2014-07-19 DIAGNOSIS — G8929 Other chronic pain: Secondary | ICD-10-CM

## 2014-07-19 HISTORY — DX: Dorsalgia, unspecified: M54.9

## 2014-07-19 HISTORY — PX: COLONOSCOPY: SHX5424

## 2014-07-19 LAB — GLUCOSE, CAPILLARY: Glucose-Capillary: 195 mg/dL — ABNORMAL HIGH (ref 70–99)

## 2014-07-19 SURGERY — COLONOSCOPY
Anesthesia: Moderate Sedation

## 2014-07-19 MED ORDER — MEPERIDINE HCL 100 MG/ML IJ SOLN
INTRAMUSCULAR | Status: AC
  Filled 2014-07-19: qty 2

## 2014-07-19 MED ORDER — PROMETHAZINE HCL 25 MG/ML IJ SOLN
12.5000 mg | Freq: Once | INTRAMUSCULAR | Status: AC
  Administered 2014-07-19: 12.5 mg via INTRAVENOUS
  Filled 2014-07-19: qty 1

## 2014-07-19 MED ORDER — SODIUM CHLORIDE 0.9 % IJ SOLN
INTRAMUSCULAR | Status: AC
  Filled 2014-07-19: qty 10

## 2014-07-19 MED ORDER — MEPERIDINE HCL 100 MG/ML IJ SOLN
INTRAMUSCULAR | Status: DC | PRN
  Administered 2014-07-19 (×2): 25 mg via INTRAVENOUS
  Administered 2014-07-19: 50 mg

## 2014-07-19 MED ORDER — SODIUM CHLORIDE 0.9 % IV SOLN
INTRAVENOUS | Status: DC
  Administered 2014-07-19: 09:00:00 via INTRAVENOUS

## 2014-07-19 MED ORDER — MIDAZOLAM HCL 5 MG/5ML IJ SOLN
INTRAMUSCULAR | Status: DC
Start: 2014-07-19 — End: 2014-07-19
  Filled 2014-07-19: qty 10

## 2014-07-19 MED ORDER — MIDAZOLAM HCL 5 MG/5ML IJ SOLN
INTRAMUSCULAR | Status: DC | PRN
  Administered 2014-07-19 (×2): 2 mg via INTRAVENOUS
  Administered 2014-07-19: 1 mg via INTRAVENOUS
  Administered 2014-07-19: 2 mg via INTRAVENOUS

## 2014-07-19 NOTE — Discharge Instructions (Signed)
You had 2 small RECTAL polyps removed. You have internal hemorrhoids. I BIOPSIED YOUR COLON.   FOLLOW A HIGH FIBER DIET. AVOID ITEMS THAT CAUSE BLOATING & GAS. SEE INFO BELOW.  YOUR BIOPSY RESULTS SHOULD BE BACK IN 14 DAYS.  Next colonoscopy in 5-10 years.  Colonoscopy Care After Read the instructions outlined below and refer to this sheet in the next week. These discharge instructions provide you with general information on caring for yourself after you leave the hospital. While your treatment has been planned according to the most current medical practices available, unavoidable complications occasionally occur. If you have any problems or questions after discharge, call DR. Geneve Kimpel, [phone].  ACTIVITY  You may resume your regular activity, but move at a slower pace for the next 24 hours.   Take frequent rest periods for the next 24 hours.   Walking will help get rid of the air and reduce the bloated feeling in your belly (abdomen).   No driving for 24 hours (because of the medicine (anesthesia) used during the test).   You may shower.   Do not sign any important legal documents or operate any machinery for 24 hours (because of the anesthesia used during the test).    NUTRITION  Drink plenty of fluids.   You may resume your normal diet as instructed by your doctor.   Begin with a light meal and progress to your normal diet. Heavy or fried foods are harder to digest and may make you feel sick to your stomach (nauseated).   Avoid alcoholic beverages for 24 hours or as instructed.    MEDICATIONS  You may resume your normal medications.   WHAT YOU CAN EXPECT TODAY  Some feelings of bloating in the abdomen.   Passage of more gas than usual.   Spotting of blood in your stool or on the toilet paper  .  IF YOU HAD POLYPS REMOVED DURING THE COLONOSCOPY:  Eat a soft diet IF YOU HAVE NAUSEA, BLOATING, ABDOMINAL PAIN, OR VOMITING.    FINDING OUT THE RESULTS OF YOUR  TEST Not all test results are available during your visit. DR. Oneida Alar WILL CALL YOU WITHIN 7 DAYS OF YOUR PROCEDUE WITH YOUR RESULTS. Do not assume everything is normal if you have not heard from DR. Dollene Mallery IN ONE WEEK, CALL HER OFFICE AT [phone].  SEEK IMMEDIATE MEDICAL ATTENTION AND CALL THE OFFICE: [phone] IF:  You have more than a spotting of blood in your stool.   Your belly is swollen (abdominal distention).   You are nauseated or vomiting.   You have a temperature over 101F.   You have abdominal pain or discomfort that is severe or gets worse throughout the day.   High-Fiber Diet A high-fiber diet changes your normal diet to include more whole grains, legumes, fruits, and vegetables. Changes in the diet involve replacing refined carbohydrates with unrefined foods. The calorie level of the diet is essentially unchanged. The Dietary Reference Intake (recommended amount) for adult males is 38 grams per day. For adult females, it is 25 grams per day. Pregnant and lactating women should consume 28 grams of fiber per day. Fiber is the intact part of a plant that is not broken down during digestion. Functional fiber is fiber that has been isolated from the plant to provide a beneficial effect in the body. PURPOSE  Increase stool bulk.   Ease and regulate bowel movements.   Lower cholesterol.  INDICATIONS THAT YOU NEED MORE FIBER  Constipation and hemorrhoids.  Uncomplicated diverticulosis (intestine condition) and irritable bowel syndrome.   Weight management.   As a protective measure against hardening of the arteries (atherosclerosis), diabetes, and cancer.   GUIDELINES FOR INCREASING FIBER IN THE DIET  Start adding fiber to the diet slowly. A gradual increase of about 5 more grams (2 slices of whole-wheat bread, 2 servings of most fruits or vegetables, or 1 bowl of high-fiber cereal) per day is best. Too rapid an increase in fiber may result in constipation,  flatulence, and bloating.   Drink enough water and fluids to keep your urine clear or pale yellow. Water, juice, or caffeine-free drinks are recommended. Not drinking enough fluid may cause constipation.   Eat a variety of high-fiber foods rather than one type of fiber.   Try to increase your intake of fiber through using high-fiber foods rather than fiber pills or supplements that contain small amounts of fiber.   The goal is to change the types of food eaten. Do not supplement your present diet with high-fiber foods, but replace foods in your present diet.  INCLUDE A VARIETY OF FIBER SOURCES  Replace refined and processed grains with whole grains, canned fruits with fresh fruits, and incorporate other fiber sources. White rice, white breads, and most bakery goods contain little or no fiber.   Brown whole-grain rice, buckwheat oats, and many fruits and vegetables are all good sources of fiber. These include: broccoli, Brussels sprouts, cabbage, cauliflower, beets, sweet potatoes, white potatoes (skin on), carrots, tomatoes, eggplant, squash, berries, fresh fruits, and dried fruits.   Cereals appear to be the richest source of fiber. Cereal fiber is found in whole grains and bran. Bran is the fiber-rich outer coat of cereal grain, which is largely removed in refining. In whole-grain cereals, the bran remains. In breakfast cereals, the largest amount of fiber is found in those with "bran" in their names. The fiber content is sometimes indicated on the label.   You may need to include additional fruits and vegetables each day.   In baking, for 1 cup white flour, you may use the following substitutions:   1 cup whole-wheat flour minus 2 tablespoons.   1/2 cup white flour plus 1/2 cup whole-wheat flour.   Polyps, Colon  A polyp is extra tissue that grows inside your body. Colon polyps grow in the large intestine. The large intestine, also called the colon, is part of your digestive system. It  is a long, hollow tube at the end of your digestive tract where your body makes and stores stool. Most polyps are not dangerous. They are benign. This means they are not cancerous. But over time, some types of polyps can turn into cancer. Polyps that are smaller than a pea are usually not harmful. But larger polyps could someday become or may already be cancerous. To be safe, doctors remove all polyps and test them.   WHO GETS POLYPS? Anyone can get polyps, but certain people are more likely than others. You may have a greater chance of getting polyps if:  You are over 50.   You have had polyps before.   Someone in your family has had polyps.   Someone in your family has had cancer of the large intestine.   Find out if someone in your family has had polyps. You may also be more likely to get polyps if you:   Eat a lot of fatty foods   Smoke   Drink alcohol   Do not exercise  Eat too  much   TREATMENT  The caregiver will remove the polyp during sigmoidoscopy or colonoscopy.    PREVENTION There is not one sure way to prevent polyps. You might be able to lower your risk of getting them if you:  Eat more fruits and vegetables and less fatty food.   Do not smoke.   Avoid alcohol.   Exercise every day.   Lose weight if you are overweight.   Eating more calcium and folate can also lower your risk of getting polyps. Some foods that are rich in calcium are milk, cheese, and broccoli. Some foods that are rich in folate are chickpeas, kidney beans, and spinach.   Hemorrhoids Hemorrhoids are dilated (enlarged) veins around the rectum. Sometimes clots will form in the veins. This makes them swollen and painful. These are called thrombosed hemorrhoids. Causes of hemorrhoids include:  Constipation.   Straining to have a bowel movement.   HEAVY LIFTING HOME CARE INSTRUCTIONS  Eat a well balanced diet and drink 6 to 8 glasses of water every day to avoid constipation. You may also  use a bulk laxative.   Avoid straining to have bowel movements.   Keep anal area dry and clean.   Do not use a donut shaped pillow or sit on the toilet for long periods. This increases blood pooling and pain.   Move your bowels when your body has the urge; this will require less straining and will decrease pain and pressure.

## 2014-07-19 NOTE — Interval H&P Note (Signed)
History and Physical Interval Note:  07/19/2014 9:29 AM  Diana Pratt  has presented today for surgery, with the diagnosis of LOWER QUAD ADOMINAL PAIN, TRANSAMINITIS  The various methods of treatment have been discussed with the patient and family. After consideration of risks, benefits and other options for treatment, the patient has consented to  Procedure(s) with comments: COLONOSCOPY (N/A) - 9:30 as a surgical intervention .  The patient's history has been reviewed, patient examined, no change in status, stable for surgery.  I have reviewed the patient's chart and labs.  Questions were answered to the patient's satisfaction.     [employer]

## 2014-07-19 NOTE — H&P (View-Only) (Signed)
Subjective:    Patient ID: Diana Pratt, female    DOB: [birth year], 68 y.o.   MRN: [id number]  HPI HYSTERECTOMY IN 1985. HAS BEEN HAVING BURNING PAIN.CARAMPY IN LOWER ABDOMEN. MAY HAVE DIARRHEA OFF AND ON BUT THINKS IT MAY BE DUE TO MEDS. NOT EVERY DAY.  NEVER HAD UPPER ENDOSCOPY. TCS 2005 DUE TO TCS-NL. HAPPEN 2-3X/WEEK AND GOES AND NO PAIN. UNDER A LOT OF STRESS-CARING FOR AILING AUNT. BMs: 3X/WEEK. MAY BE ASSOCIATED WITH BLOATING/GAS. CAN BE RELATED TO WHAT SHE EATS. SX BEEN ABOUT THE SAME. DIAGNOSED WITH IBS AFTER HER HSY. OCCASIONAL HEARTBURN RX: TUMS. NO WEIGHT LOSS OR CHANGE IN APPETITE. MAY HAVE DYSURIA BUT NO HEMATURIA. TOOK ABX FOR UTI AND GOT A YEAST INFECTION AND GETS FLARES IN VAGINA; AREA.   PT DENIES FEVER, CHILLS, HEMATOCHEZIA,  nausea, vomiting, melena, CHEST PAIN, SHORTNESS OF BREATH, CHANGE IN BOWEL IN HABITS, problems swallowing, OR problems with sedation.  Past Medical History  Diagnosis Date  . Hypertension   . Diabetes mellitus   . Anxiety   . Hyperlipemia   . Depression   . Interstitial cystitis   . COPD (chronic obstructive pulmonary disease)   . Asthma    Past Surgical History  Procedure Laterality Date  . Abdominal hysterectomy  03/1984    partial   Allergies  Allergen Reactions  . Lipitor [Atorvastatin]     Myalgias   . Morphine And Related Other (See Comments)    Burning, itching  . Penicillins   . Simvastatin Other (See Comments)    myalgias  . Sulfa Antibiotics   . Zetia [Ezetimibe] Cough    Current Outpatient Prescriptions  Medication Sig Dispense Refill  . acetaminophen (TYLENOL) 325 MG tablet Take 650 mg by mouth every 6 (six) hours as needed. pain    . albuterol (PROAIR HFA) 108 (90 BASE) MCG/ACT inhaler Inhale 2 puffs into the lungs every 6 (six) hours as needed for shortness of breath.    Marland Kitchen albuterol (PROVENTIL) (2.5 MG/3ML) 0.083% nebulizer solution Take 2.5 mg by nebulization every 6 (six) hours as needed. Shortness of breath       . amLODipine-valsartan (EXFORGE) 10-320 MG per tablet Take 1 tablet by mouth daily.    Marland Kitchen aspirin 81 MG EC tablet Take 81 mg by mouth daily.      . budesonide-formoterol (SYMBICORT) 160-4.5 MCG/ACT inhaler Inhale 2 puffs into the lungs 2 (two) times daily.    . calcium carbonate 200 MG capsule Take 250 mg by mouth daily.     . Cholecalciferol (VITAMIN D3) 2000 UNITS capsule Take 2 capsules (4,000 Units total) by mouth daily.    Marland Kitchen escitalopram (LEXAPRO) 10 MG tablet Take 1 tablet (10 mg total) by mouth daily.    . fluticasone (FLONASE) 50 MCG/ACT nasal spray Place 2 sprays into the nose daily as needed. Congestion     . hydrochlorothiazide (HYDRODIURIL) 25 MG tablet Take 1 tablet (25 mg total) by mouth daily.    .      . hydroxypropyl methylcellulose (ISOPTO TEARS) 2.5 % ophthalmic solution Place 1 drop into both eyes daily as needed. for dry eyes     . ibuprofen (ADVIL,MOTRIN) 200 MG tablet Take 400 mg by mouth daily.    . Insulin Glargine (LANTUS SOLOSTAR) 100 UNIT/ML SOPN Inject 29 Units into the skin daily at 10 pm.    . LORazepam (ATIVAN) 1 MG tablet Take 1 tablet (1 mg total) by mouth 2 (two) times daily as needed for anxiety.    Marland Kitchen  montelukast (SINGULAIR) 10 MG tablet Take 10 mg by mouth at bedtime.     . Multiple Vitamins-Minerals (CENTRUM SILVER PO) Take 1 tablet by mouth daily.     . promethazine (PHENERGAN) 25 MG tablet Take 1 tablet (25 mg total) by mouth every 6 (six) hours as needed for nausea or vomiting. As needed   . sitaGLIPtin (JANUVIA) 100 MG tablet Take 1 tablet (100 mg total) by mouth daily.      Review of Systems PER HPI OTHERWISE ALL SYSTEMS ARE NEGATIVE.     Objective:   Physical Exam  Vitals reviewed. Constitutional: She is oriented to person, place, and time. She appears well-developed and well-nourished. No distress.  HENT:  Head: Normocephalic and atraumatic.  Mouth/Throat: Oropharynx is clear and moist. No oropharyngeal exudate.  Eyes: Pupils are equal,  round, and reactive to light. No scleral icterus.  Neck: Normal range of motion. Neck supple.  Cardiovascular: Normal rate, regular rhythm and normal heart sounds.   Pulmonary/Chest: Effort normal and breath sounds normal. No respiratory distress.  Abdominal: Soft. Bowel sounds are normal. She exhibits no distension. There is no tenderness.  Musculoskeletal: She exhibits no edema.  Lymphadenopathy:    She has no cervical adenopathy.  Neurological: She is alert and oriented to person, place, and time.  NO FOCAL DEFICITS   Psychiatric: She has a normal mood and affect.          Assessment & Plan:

## 2014-07-19 NOTE — Op Note (Addendum)
[facility] [address] [place], [address]   COLONOSCOPY PROCEDURE REPORT  PATIENT: Diana Pratt, Diana Pratt  MR#: [id number] BIRTHDATE: October 13, 1946 , 68  yrs. old GENDER: Female ENDOSCOPIST: Barney Drain, MD REFERRED [id number] Laurance Flatten, M.D. PROCEDURE DATE:  07/19/2014 PROCEDURE:   Colonoscopy with biopsy and with cold biopsy polypectomy INDICATIONS:Change in bowel habits: INTERMITTENT DIARRHEA. PMHx: DIABETES/NASH. MEDICATIONS: Promethazine (Phenergan) 12.5mg  IV, Demerol 100 mg IV, and Versed  DESCRIPTION OF PROCEDURE:    Physical exam was performed.  Informed consent was obtained from the patient after explaining the benefits, risks, and alternatives to procedure.  The patient was connected to monitor and placed in left lateral position. Continuous oxygen was provided by nasal cannula and IV medicine administered through an indwelling cannula.  After administration of sedation and rectal exam, the patients rectum was intubated and the EC-[id number] ([id number])  colonoscope was advanced under direct visualization to the ileum.  The scope was removed slowly by carefully examining the color, texture, anatomy, and integrity mucosa on the way out.  The patient was recovered in endoscopy and discharged home in satisfactory condition.    COLON FINDINGS: The mucosa appeared normal in the terminal ileum. NO PICTURES TAKEN. , The LEFT colon IS redundant.  Manual abdominal counter-pressure was used to reach the cecum.  The patient was moved on to their back to reach the cecum, The colon mucosa was otherwise normal. Random cold forecps biopsies obtained in colon. TWO Sessile polyps measuring 2-3 mm in size wERE found in the rectum.  A polypectomy was performed.  Small internal hemorrhoids were found.  PREP QUALITY: good.  CECAL W/D TIME: 15 minutes     COMPLICATIONS: None  ENDOSCOPIC IMPRESSION: 1.   NO SOURCE FOR DIARRHEA IDENTIFIED. 2.   The LEFT colon IS redundant 3.   TWO  RECTAL POLYPS REMOVED 4.   Small internal hemorrhoids  RECOMMENDATIONS: FOLLOW A HIGH FIBER DIET.  AVOID ITEMS THAT CAUSE BLOATING & GAS. BIOPSY RESULTS SHOULD BE BACK IN 14 DAYS. OPV IN DEC 2015 Next colonoscopy in 5-10 years WITH AN OVERTUBE AND PHENERGAN IN PREOP.       _______________________________ eSignedBarney Drain, MD 07/19/2014 2:51 PM Revised: 07/19/2014 2:51 PM

## 2014-07-21 ENCOUNTER — Encounter: Payer: Self-pay | Admitting: Family Medicine

## 2014-07-21 ENCOUNTER — Other Ambulatory Visit: Payer: Self-pay | Admitting: Family Medicine

## 2014-07-21 ENCOUNTER — Ambulatory Visit (INDEPENDENT_AMBULATORY_CARE_PROVIDER_SITE_OTHER): Payer: Medicare Other | Admitting: Family Medicine

## 2014-07-21 ENCOUNTER — Telehealth: Payer: Self-pay | Admitting: Family Medicine

## 2014-07-21 VITALS — BP 153/67 | HR 101 | Temp 96.7°F | Ht 64.0 in | Wt 199.2 lb

## 2014-07-21 DIAGNOSIS — F341 Dysthymic disorder: Secondary | ICD-10-CM

## 2014-07-21 DIAGNOSIS — F418 Other specified anxiety disorders: Secondary | ICD-10-CM

## 2014-07-21 DIAGNOSIS — E1159 Type 2 diabetes mellitus with other circulatory complications: Secondary | ICD-10-CM

## 2014-07-21 DIAGNOSIS — J449 Chronic obstructive pulmonary disease, unspecified: Secondary | ICD-10-CM

## 2014-07-21 DIAGNOSIS — I10 Essential (primary) hypertension: Secondary | ICD-10-CM

## 2014-07-21 DIAGNOSIS — F411 Generalized anxiety disorder: Secondary | ICD-10-CM

## 2014-07-21 DIAGNOSIS — E785 Hyperlipidemia, unspecified: Secondary | ICD-10-CM

## 2014-07-21 LAB — POCT UA - MICROALBUMIN: Microalbumin Ur, POC: NEGATIVE mg/L

## 2014-07-21 LAB — POCT GLYCOSYLATED HEMOGLOBIN (HGB A1C): Hemoglobin A1C: 7.4

## 2014-07-21 MED ORDER — AMLODIPINE BESYLATE-VALSARTAN 10-320 MG PO TABS: 1.0000 | ORAL_TABLET | Freq: Every day | ORAL | Status: DC

## 2014-07-21 MED ORDER — ESCITALOPRAM OXALATE 10 MG PO TABS: 20.0000 mg | ORAL_TABLET | Freq: Every day | ORAL | Status: DC

## 2014-07-21 MED ORDER — LORAZEPAM 1 MG PO TABS: 1.0000 mg | ORAL_TABLET | Freq: Two times a day (BID) | ORAL | Status: DC | PRN

## 2014-07-21 MED ORDER — HYDROCHLOROTHIAZIDE 25 MG PO TABS: 25.0000 mg | ORAL_TABLET | Freq: Every day | ORAL | Status: DC

## 2014-07-21 MED ORDER — SITAGLIPTIN PHOSPHATE 100 MG PO TABS: 100.0000 mg | ORAL_TABLET | Freq: Every day | ORAL | Status: DC

## 2014-07-21 NOTE — Progress Notes (Signed)
   Subjective:    Patient ID: Diana Pratt, female    DOB: Jan 30, 1946, 68 y.o.   MRN: [id number]  HPI 68 year old female here to followup diabetes hypertension, and depression. Just this week she had colonoscopy with polypectomy.Marland Kitchen She wonders if she needs a short-acting insulin but her last A1c 7 months ago was 5.8. We will measure it today. She feels like the Lexapro at 10 mg is not helping and we probably need to increase the dose to 20 mg before giving up on this medication.    Review of Systems  Respiratory: Positive for wheezing.   Gastrointestinal: Positive for constipation.  All other systems reviewed and are negative.      Objective:   Physical Exam  Constitutional: She is oriented to person, place, and time. She appears well-developed and well-nourished.  Eyes: Conjunctivae and EOM are normal.  Neck: Normal range of motion. Neck supple.  Cardiovascular: Normal rate, regular rhythm and normal heart sounds.   Pulmonary/Chest: Effort normal and breath sounds normal.  Abdominal: Soft. Bowel sounds are normal.  Musculoskeletal: Normal range of motion.  Neurological: She is alert and oriented to person, place, and time. She has normal reflexes.  Skin: Skin is warm and dry.  Psychiatric: She has a normal mood and affect. Her behavior is normal. Thought content normal.    BP 153/67  Pulse 101  Temp(Src) 96.7 F (35.9 C) (Oral)  Ht $R'5\' 4"'Ss$  (1.626 m)  Wt 199 lb 3.2 oz (90.357 kg)  BMI 34.18 kg/m2      Assessment & Plan:  1. Essential hypertension  - hydrochlorothiazide (HYDRODIURIL) 25 MG tablet; Take 1 tablet (25 mg total) by mouth daily.  Dispense: 30 tablet; Refill: 11 - amLODipine-valsartan (EXFORGE) 10-320 MG per tablet; Take 1 tablet by mouth daily.  Dispense: 30 tablet; Refill: 5  2. Chronic obstructive pulmonary disease, unspecified COPD, unspecified chronic bronchitis type   3. Type II or unspecified type diabetes mellitus with peripheral circulatory  disorders, uncontrolled(250.72)  - POCT UA - Microalbumin - POCT glycosylated hemoglobin (Hb A1C) - BMP8+EGFR - sitaGLIPtin (JANUVIA) 100 MG tablet; Take 1 tablet (100 mg total) by mouth daily.  Dispense: 30 tablet; Refill: 5  4. Other and unspecified hyperlipidemia  - Lipid panel  Will try Lipitor at reduced frequency to see if better tolerated  5. Generalized anxiety disorder* - escitalopram (LEXAPRO) 10 MG tablet; Take 2 tablets (20 mg total) by mouth daily.  Dispense: 30 tablet; Refill: 1  6. Depression with anxiety  - LORazepam (ATIVAN) 1 MG tablet; Take 1 tablet (1 mg total) by mouth 2 (two) times daily as needed for anxiety.  Dispense: 60 tablet; Refill: 0  Wardell Honour MD

## 2014-07-22 LAB — LIPID PANEL
CHOLESTEROL TOTAL: 242 mg/dL — AB (ref 100–199)
Chol/HDL Ratio: 7.1 ratio units — ABNORMAL HIGH (ref 0.0–4.4)
HDL: 34 mg/dL — AB (ref >39)
LDL Calculated: 167 mg/dL — ABNORMAL HIGH (ref 0–99)
TRIGLYCERIDES: 204 mg/dL — AB (ref 0–149)
VLDL Cholesterol Cal: 41 mg/dL — ABNORMAL HIGH (ref 5–40)

## 2014-07-22 LAB — BMP8+EGFR
BUN/Creatinine Ratio: 19 (ref 11–26)
BUN: 15 mg/dL (ref 8–27)
CHLORIDE: 99 mmol/L (ref 97–108)
CO2: 24 mmol/L (ref 18–29)
CREATININE: 0.81 mg/dL (ref 0.57–1.00)
Calcium: 9.4 mg/dL (ref 8.7–10.3)
GFR calc non Af Amer: 75 mL/min/{1.73_m2} (ref >59)
GFR, EST AFRICAN AMERICAN: 86 mL/min/{1.73_m2} (ref >59)
GLUCOSE: 175 mg/dL — AB (ref 65–99)
Potassium: 3.9 mmol/L (ref 3.5–5.2)
Sodium: 141 mmol/L (ref 134–144)

## 2014-07-22 NOTE — Telephone Encounter (Signed)
Patient last seen in office on 07-21-14. Rx last filled on 06-16-14 for #60. Please advise on refill. If approved please route to Pool A so nurse can phone in to pharmacy

## 2014-07-22 NOTE — Telephone Encounter (Signed)
Okay to refill at same dose and number

## 2014-07-27 MED ORDER — ATORVASTATIN CALCIUM 20 MG PO TABS: 20.0000 mg | ORAL_TABLET | Freq: Every day | ORAL | Status: DC

## 2014-07-27 NOTE — Addendum Note (Signed)
Addended by: Zannie Cove on: 07/27/2014 09:39 AM   Modules accepted: Orders

## 2014-07-29 NOTE — Telephone Encounter (Signed)
This was already done

## 2014-08-16 ENCOUNTER — Other Ambulatory Visit: Payer: Self-pay | Admitting: *Deleted

## 2014-08-16 MED ORDER — INSULIN GLARGINE 100 UNIT/ML SOLOSTAR PEN: 29.0000 [IU] | PEN_INJECTOR | Freq: Every day | SUBCUTANEOUS | Status: DC

## 2014-08-16 MED ORDER — PEN NEEDLES 31G X 6 MM MISC: Status: DC

## 2014-08-23 ENCOUNTER — Telehealth: Payer: Self-pay | Admitting: Gastroenterology

## 2014-08-23 NOTE — Telephone Encounter (Signed)
Please call pt. She had simple adenomas removed.  Her colon Bx are normal. 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  FOLLOW A HIGH FIBER DIET. AVOID ITEMS THAT CAUSE BLOATING & GAS.  OPV DEC 2015: DIARRHEA/ABD PAIN/NASH E30.  Next colonoscopy in 5-10 years WITH PHENERGAN IN PREOP/OVERTUBE.

## 2014-08-23 NOTE — Telephone Encounter (Signed)
OV NIC'D

## 2014-08-23 NOTE — Telephone Encounter (Signed)
Called and informed pt.  

## 2014-09-06 ENCOUNTER — Encounter: Payer: Self-pay | Admitting: Family Medicine

## 2014-09-14 ENCOUNTER — Encounter: Payer: Self-pay | Admitting: Gastroenterology

## 2014-09-22 ENCOUNTER — Ambulatory Visit (INDEPENDENT_AMBULATORY_CARE_PROVIDER_SITE_OTHER): Payer: Medicare Other | Admitting: Family Medicine

## 2014-09-22 VITALS — BP 157/77 | HR 115 | Temp 98.6°F | Wt 200.8 lb

## 2014-09-22 DIAGNOSIS — J206 Acute bronchitis due to rhinovirus: Secondary | ICD-10-CM

## 2014-09-22 MED ORDER — FLUCONAZOLE 150 MG PO TABS: 150.0000 mg | ORAL_TABLET | Freq: Once | ORAL | Status: DC

## 2014-09-22 MED ORDER — LEVOFLOXACIN 500 MG PO TABS: 500.0000 mg | ORAL_TABLET | Freq: Every day | ORAL | Status: DC

## 2014-09-22 MED ORDER — LEVALBUTEROL HCL 1.25 MG/0.5ML IN NEBU: 1.2500 mg | INHALATION_SOLUTION | Freq: Once | RESPIRATORY_TRACT | Status: DC

## 2014-09-22 MED ORDER — ALBUTEROL SULFATE (2.5 MG/3ML) 0.083% IN NEBU: 2.5000 mg | INHALATION_SOLUTION | Freq: Four times a day (QID) | RESPIRATORY_TRACT | Status: DC | PRN

## 2014-09-22 MED ORDER — HYDROCODONE-HOMATROPINE 5-1.5 MG/5ML PO SYRP: 5.0000 mL | ORAL_SOLUTION | Freq: Three times a day (TID) | ORAL | Status: DC | PRN

## 2014-09-22 MED ORDER — METHYLPREDNISOLONE ACETATE 80 MG/ML IJ SUSP: 80.0000 mg | Freq: Once | INTRAMUSCULAR | Status: DC

## 2014-09-22 NOTE — Progress Notes (Signed)
   Subjective:    Patient ID: Diana Pratt, female    DOB: 1945-11-10, 68 y.o.   MRN: [id number]  HPI C/o cough and uri sx's for over 3 days.  She has hx of COPD and she has been coughing and it is worse at hs.   Review of Systems No chest pain, SOB, HA, dizziness, vision change, N/V, diarrhea, constipation, dysuria, urinary urgency or frequency, myalgias, arthralgias or rash.     Objective:   Physical Exam  Vital signs noted  Well developed well nourished female.  HEENT - Head atraumatic Normocephalic                Eyes - PERRLA, Conjuctiva - clear Sclera- Clear EOMI                Ears - EAC's Wnl TM's Wnl Gross Hearing WNL                Nose - Nares patent                 Throat - oropharanx wnl Respiratory - Lungs with expiratory wheezes Cardiac - RRR S1 and S2 without murmur GI - Abdomen soft Nontender and bowel sounds active x 4 Extremities - No edema. Neuro - Grossly intact.      Assessment & Plan:  Acute bronchitis due to Rhinovirus - Plan: methylPREDNISolone acetate (DEPO-MEDROL) injection 80 mg, levalbuterol (XOPENEX) nebulizer solution 1.25 mg, HYDROcodone-homatropine (HYCODAN) 5-1.5 MG/5ML syrup, levofloxacin (LEVAQUIN) 500 MG tablet, fluconazole (DIFLUCAN) 150 MG tablet, albuterol (PROVENTIL) (2.5 MG/3ML) 0.083% nebulizer solution  Push po fluids, rest, tylenol and motrin otc prn as directed for fever, arthralgias, and myalgias.  Follow up prn if sx's continue or persist.  Lysbeth Penner FNP

## 2014-10-22 ENCOUNTER — Other Ambulatory Visit: Payer: Self-pay | Admitting: *Deleted

## 2014-10-22 ENCOUNTER — Encounter: Payer: Self-pay | Admitting: Family Medicine

## 2014-10-22 ENCOUNTER — Ambulatory Visit (INDEPENDENT_AMBULATORY_CARE_PROVIDER_SITE_OTHER): Payer: Medicare Other | Admitting: Family Medicine

## 2014-10-22 VITALS — BP 157/70 | HR 85 | Temp 97.2°F | Ht 64.0 in | Wt 199.8 lb

## 2014-10-22 DIAGNOSIS — E785 Hyperlipidemia, unspecified: Secondary | ICD-10-CM

## 2014-10-22 DIAGNOSIS — I10 Essential (primary) hypertension: Secondary | ICD-10-CM

## 2014-10-22 DIAGNOSIS — J449 Chronic obstructive pulmonary disease, unspecified: Secondary | ICD-10-CM

## 2014-10-22 DIAGNOSIS — F418 Other specified anxiety disorders: Secondary | ICD-10-CM

## 2014-10-22 MED ORDER — LORAZEPAM 1 MG PO TABS: ORAL_TABLET | ORAL | Status: DC

## 2014-10-22 MED ORDER — PITAVASTATIN CALCIUM 2 MG PO TABS: 1.0000 | ORAL_TABLET | Freq: Every day | ORAL | Status: DC

## 2014-10-22 MED ORDER — CEFDINIR 300 MG PO CAPS: 300.0000 mg | ORAL_CAPSULE | Freq: Two times a day (BID) | ORAL | Status: DC

## 2014-10-22 NOTE — Patient Instructions (Signed)
Titrating Lantus Increase insulin by 4 units weekly until average blood sugar is less than 150.

## 2014-10-22 NOTE — Progress Notes (Signed)
   Subjective:    Patient ID: Diana Pratt, female    DOB: November 28, 1945, 68 y.o.   MRN: [id number]  HPI  68 year old female with COPD, diabetes, and hypertension. This was a regularly scheduled appointment but she is complaining today of sore throat earache and sinus congestion. She was seen for similar symptoms one month ago but feels the problem never really resolved. She is also complaining of insomnia. She is on Lexapro 20 mg as well as lorazepam 1 mg twice a day.    Review of Systems  Constitutional: Negative.   HENT: Positive for congestion and sinus pressure.   Respiratory: Positive for wheezing.   Cardiovascular: Negative.   Genitourinary: Negative.   Neurological: Negative.   Psychiatric/Behavioral: The patient is nervous/anxious.        Objective:   Physical Exam  Constitutional: She is oriented to person, place, and time. She appears well-developed and well-nourished.  HENT:  Tympanic membranes are dull with fluid visible  Cardiovascular: Normal rate.   Pulmonary/Chest: Effort normal and breath sounds normal.  Neurological: She is alert and oriented to person, place, and time. She has normal reflexes.   BP 157/70 mmHg  Pulse 85  Temp(Src) 97.2 F (36.2 C) (Oral)  Ht 5\' 4"  (1.626 m)  Wt 199 lb 12.8 oz (90.629 kg)  BMI 34.28 kg/m2       Assessment & Plan:  1. Essential hypertension   2. Depression with anxiety Continue with Lexapro and Ativan  3. Chronic obstructive pulmonary disease, unspecified COPD, unspecified chronic bronchitis type   4. Hyperlipemia Sample of livalo. Have asked her to call back after she tries to samples to see if she has myalgias with this new statin

## 2014-12-09 ENCOUNTER — Encounter: Payer: Self-pay | Admitting: Pharmacist

## 2014-12-09 ENCOUNTER — Ambulatory Visit (INDEPENDENT_AMBULATORY_CARE_PROVIDER_SITE_OTHER): Payer: Medicare Other | Admitting: Pharmacist

## 2014-12-09 VITALS — BP 148/88 | HR 75 | Ht 64.0 in | Wt 203.0 lb

## 2014-12-09 DIAGNOSIS — E1140 Type 2 diabetes mellitus with diabetic neuropathy, unspecified: Secondary | ICD-10-CM | POA: Diagnosis not present

## 2014-12-09 DIAGNOSIS — R7989 Other specified abnormal findings of blood chemistry: Secondary | ICD-10-CM

## 2014-12-09 DIAGNOSIS — E785 Hyperlipidemia, unspecified: Secondary | ICD-10-CM | POA: Diagnosis not present

## 2014-12-09 DIAGNOSIS — E669 Obesity, unspecified: Secondary | ICD-10-CM | POA: Diagnosis not present

## 2014-12-09 DIAGNOSIS — E1142 Type 2 diabetes mellitus with diabetic polyneuropathy: Secondary | ICD-10-CM | POA: Diagnosis not present

## 2014-12-09 DIAGNOSIS — I10 Essential (primary) hypertension: Secondary | ICD-10-CM

## 2014-12-09 LAB — POCT GLYCOSYLATED HEMOGLOBIN (HGB A1C): HEMOGLOBIN A1C: 7.8

## 2014-12-09 MED ORDER — METFORMIN HCL ER 500 MG PO TB24: 500.0000 mg | ORAL_TABLET | Freq: Every day | ORAL | Status: DC

## 2014-12-09 NOTE — Progress Notes (Signed)
Subjective:    Diana Pratt is a 69 y.o. female who presents for an  evaluation of Type 2 diabetes mellitus and hyperlipidemia.   Current symptoms/problems include hyperglycemia and paresthesia of the feet and have been stable. Symptoms have been present for 3 months. The patient was initially diagnosed with Type 2 diabetes mellitus in the 1990's  Known diabetic complications: peripheral neuropathy Cardiovascular risk factors: advanced age (older than 12 for men, 56 for women), diabetes mellitus, dyslipidemia, hypertension, obesity (BMI >= 30 kg/m2) and sedentary lifestyle Current diabetic medications include Lantus 34 units daily and Januvia 184m daily.  Patient reports that she took up to 20070mof metformin with better control of BG but stopped because of diarrhea.  Eye exam current (within one year): yes Weight trend: increasing steadily Prior visit with dietician: no Current diet: in general, an "unhealthy" diet Current exercise: none  Current monitoring regimen: home blood tests - 2  times daily Home blood sugar records: fasting range: 150's Any episodes of hypoglycemia? no  Is She on ACE inhibitor or angiotensin II receptor blocker?  Yes    valsartan + amlodipine   Patient was started on Livalo 64m17m tablet daily 1 month ago.  She took for about 2 weeks and then stopped due to increase in myalgia.  She is tried several other statins with similar results - atorvastatin, simvastatin.  She also has not been able to tolerate zetia - per her report it cause her to cough.  The following portions of the patient's history were reviewed and updated as appropriate: allergies, current medications, past family history, past medical history, past social history, past surgical history and problem list.  Review of Systems   Objective:    BP 148/88 mmHg  Pulse 75  Ht _0  (1.626 m)  Wt 203 lb (92.08 kg)  BMI 34.83 kg/m2  Lipid Panel     Component Value Date/Time   CHOL 264*  12/25/2013 0855   TRIG 204* 07/21/2014 1045   TRIG 171* 12/25/2013 0855   TRIG 304* 05/29/2013 1037   HDL 34* 07/21/2014 1045   HDL 38* 12/25/2013 0855   CHOLHDL 7.1* 07/21/2014 1045   LDLCALC 167* 07/21/2014 1045   LDLCALC 192* 12/25/2013 0855   LDLCALC 152* 05/29/2013 1037     Lab Review GLUCOSE (mg/dL)  Date Value  07/21/2014 175*  12/25/2013 92   GLUCOSE, BLD (mg/dL)  Date Value  05/31/2014 186*  09/06/2013 104*  05/29/2013 82   CO2  Date Value  07/21/2014 24 mmol/L  05/31/2014 24 mEq/L  12/25/2013 22 mmol/L   BUN (mg/dL)  Date Value  07/21/2014 15  05/31/2014 15  12/25/2013 21  09/06/2013 20  05/29/2013 12   CREAT (mg/dL)  Date Value  05/29/2013 0.84  02/27/2013 0.89   CREATININE, SER (mg/dL)  Date Value  07/21/2014 0.81  05/31/2014 0.83  12/25/2013 0.79   Assessment:    Diabetes Mellitus type II, under inadequate control.   Hyperlipidemia - intolerance to statins HTN - BP better compares to last visit but still elevated Obesity Low vitamin D  Plan:    1.  Rx changes: add metformin XR 500m49mtablet daily with breakfast - will increase in future as tolerated. 2.  Education: Reviewed 'ABCs' of diabetes management (respective goals in parentheses):  A1C (<7), blood pressure (<130/80), and cholesterol (LDL <100). 3.  Discussed diet in depth.  With her multiple medication conditions there are several dietary recommendations:    Reviewed CHO content of  foods and serving size recommendations.  Patient is instructed to limit meal to 45 to 50 grams CHO and snacks to 15 to 20 grams of CHO.  Also encouraged increased whole grains (appropropriate serving sizes), non starchy vegetables and lean protein sources.  We also discussed fresh or frozen vegetables are best and to limit her canned or processed foods / including processed meats due to high salt content. 4. Encouraged increased physical activity - such as water aerobics or Silver Sneakers program. 5.   Goals weight loss for next visit is 5# (to be less than 200#)  Orders Placed This Encounter  Procedures  . CMP14+EGFR  . Lipid panel  . LDL Cholesterol, Direct  . Vit D  25 hydroxy (rtn osteoporosis monitoring)  . POCT glycosylated hemoglobin (Hb A1C)    Follow up: 2 months

## 2014-12-09 NOTE — Patient Instructions (Signed)
Diabetes and Standards of Medical Care   Diabetes is complicated. You may find that your diabetes team includes a dietitian, nurse, diabetes educator, eye doctor, and more. To help everyone know what is going on and to help you get the care you deserve, the following schedule of care was developed to help keep you on track. Below are the tests, exams, vaccines, medicines, education, and plans you will need.  Blood Glucose Goals Prior to meals = 80 - 130 Within 2 hours of the start of a meal = less than 180  HbA1c test (goal is less than 7.0% - your last value was 7.8%) This test shows how well you have controlled your glucose over the past 2 to 3 months. It is used to see if your diabetes management plan needs to be adjusted.   It is performed at least 2 times a year if you are meeting treatment goals.  It is performed 4 times a year if therapy has changed or if you are not meeting treatment goals.  Blood pressure test  This test is performed at every routine medical visit. The goal is less than 140/90 mmHg for most people, but 130/80 mmHg in some cases. Ask your health care provider about your goal.  Dental exam  Follow up with the dentist regularly.  Eye exam  If you are diagnosed with type 1 diabetes as a child, get an exam upon reaching the age of 10 years or older and have had diabetes for 3 to 5 years. Yearly eye exams are recommended after that initial eye exam.  If you are diagnosed with type 1 diabetes as an adult, get an exam within 5 years of diagnosis and then yearly.  If you are diagnosed with type 2 diabetes, get an exam as soon as possible after the diagnosis and then yearly.  Foot care exam  Visual foot exams are performed at every routine medical visit. The exams check for cuts, injuries, or other problems with the feet.  A comprehensive foot exam should be done yearly. This includes visual inspection as well as assessing foot pulses and testing for loss of  sensation.  Check your feet nightly for cuts, injuries, or other problems with your feet. Tell your health care provider if anything is not healing.  Kidney function test (urine microalbumin)  This test is performed once a year.  Type 1 diabetes: The first test is performed 5 years after diagnosis.  Type 2 diabetes: The first test is performed at the time of diagnosis.  A serum creatinine and estimated glomerular filtration rate (eGFR) test is done once a year to assess the level of chronic kidney disease (CKD), if present.  Lipid profile (cholesterol, HDL, LDL, triglycerides)  Performed every 5 years for most people.  The goal for LDL is less than 100 mg/dL. If you are at high risk, the goal is less than 70 mg/dL.  The goal for HDL is 40 mg/dL to 50 mg/dL for men and 50 mg/dL to 60 mg/dL for women. An HDL cholesterol of 60 mg/dL or higher gives some protection against heart disease.  The goal for triglycerides is less than 150 mg/dL.  Influenza vaccine, pneumococcal vaccine, and hepatitis B vaccine  The influenza vaccine is recommended yearly.  The pneumococcal vaccine is generally given once in a lifetime. However, there are some instances when another vaccination is recommended. Check with your health care provider.  The hepatitis B vaccine is also recommended for adults with diabetes.    Diabetes self-management education  Education is recommended at diagnosis and ongoing as needed.  Treatment plan  Your treatment plan is reviewed at every medical visit.  Document Released: 08/19/2009 Document Revised: 06/24/2013 Document Reviewed: 03/24/2013 [employer] Patient Information 2014 [employer].   

## 2014-12-10 LAB — LIPID PANEL
CHOL/HDL RATIO: 8.4 ratio — AB (ref 0.0–4.4)
Cholesterol, Total: 270 mg/dL — ABNORMAL HIGH (ref 100–199)
HDL: 32 mg/dL — ABNORMAL LOW (ref >39)
LDL Calculated: 186 mg/dL — ABNORMAL HIGH (ref 0–99)
Triglycerides: 262 mg/dL — ABNORMAL HIGH (ref 0–149)
VLDL CHOLESTEROL CAL: 52 mg/dL — AB (ref 5–40)

## 2014-12-10 LAB — CMP14+EGFR
ALT: 87 IU/L — ABNORMAL HIGH (ref 0–32)
AST: 81 IU/L — ABNORMAL HIGH (ref 0–40)
Albumin/Globulin Ratio: 1.7 (ref 1.1–2.5)
Albumin: 4.2 g/dL (ref 3.6–4.8)
Alkaline Phosphatase: 66 IU/L (ref 39–117)
BUN/Creatinine Ratio: 20 (ref 11–26)
BUN: 15 mg/dL (ref 8–27)
CHLORIDE: 99 mmol/L (ref 97–108)
CO2: 24 mmol/L (ref 18–29)
Calcium: 9.8 mg/dL (ref 8.7–10.3)
Creatinine, Ser: 0.76 mg/dL (ref 0.57–1.00)
GFR calc Af Amer: 93 mL/min/{1.73_m2} (ref >59)
GFR calc non Af Amer: 81 mL/min/{1.73_m2} (ref >59)
Globulin, Total: 2.5 g/dL (ref 1.5–4.5)
Glucose: 158 mg/dL — ABNORMAL HIGH (ref 65–99)
Potassium: 4.5 mmol/L (ref 3.5–5.2)
Sodium: 142 mmol/L (ref 134–144)
Total Bilirubin: 0.5 mg/dL (ref 0.0–1.2)
Total Protein: 6.7 g/dL (ref 6.0–8.5)

## 2014-12-10 LAB — VITAMIN D 25 HYDROXY (VIT D DEFICIENCY, FRACTURES): VIT D 25 HYDROXY: 21.2 ng/mL — AB (ref 30.0–100.0)

## 2014-12-10 LAB — LDL CHOLESTEROL, DIRECT: LDL DIRECT: 206 mg/dL — AB (ref 0–99)

## 2014-12-15 ENCOUNTER — Telehealth: Payer: Self-pay | Admitting: Pharmacist

## 2014-12-15 NOTE — Telephone Encounter (Signed)
Patient's LDL and Tg are elevated. AST and ALT likely elevated due to fatty liver. Patient states she is intoleranct to statin medications.  Recommended retry statin but patient declined. We did discuss low fat / low CHO diet at appt 12/09/14.  Also discussed Repatha or Pradulant - patient declined.  Vitamin D low - patient is taking 4000IU of vitamin D most days of the week but she admits to missing 2-3 days. I will not increase dose but discussed need for increased compliance and also gave tips on ways to remember to take vitamin D supplement.  Recheck labs in 2-3 months.

## 2015-01-21 ENCOUNTER — Encounter: Payer: Self-pay | Admitting: Family Medicine

## 2015-01-21 ENCOUNTER — Ambulatory Visit (INDEPENDENT_AMBULATORY_CARE_PROVIDER_SITE_OTHER): Payer: Medicare Other | Admitting: Family Medicine

## 2015-01-21 VITALS — BP 157/85 | HR 103 | Temp 97.3°F | Ht 64.0 in | Wt 203.0 lb

## 2015-01-21 DIAGNOSIS — I10 Essential (primary) hypertension: Secondary | ICD-10-CM

## 2015-01-21 DIAGNOSIS — E119 Type 2 diabetes mellitus without complications: Secondary | ICD-10-CM

## 2015-01-21 DIAGNOSIS — F411 Generalized anxiety disorder: Secondary | ICD-10-CM | POA: Diagnosis not present

## 2015-01-21 MED ORDER — INSULIN GLULISINE 100 UNIT/ML SOLOSTAR PEN
PEN_INJECTOR | SUBCUTANEOUS | Status: DC
Start: 2015-01-21 — End: 2015-01-26

## 2015-01-21 MED ORDER — ALBUTEROL SULFATE HFA 108 (90 BASE) MCG/ACT IN AERS: 2.0000 | INHALATION_SPRAY | Freq: Four times a day (QID) | RESPIRATORY_TRACT | Status: DC | PRN

## 2015-01-21 MED ORDER — LORAZEPAM 1 MG PO TABS: ORAL_TABLET | ORAL | Status: DC

## 2015-01-21 MED ORDER — MUPIROCIN 2 % EX OINT: 1.0000 "application " | TOPICAL_OINTMENT | Freq: Three times a day (TID) | CUTANEOUS | Status: DC

## 2015-01-21 MED ORDER — MONTELUKAST SODIUM 10 MG PO TABS: 10.0000 mg | ORAL_TABLET | Freq: Every day | ORAL | Status: DC

## 2015-01-21 MED ORDER — ESCITALOPRAM OXALATE 10 MG PO TABS: 20.0000 mg | ORAL_TABLET | Freq: Every day | ORAL | Status: DC

## 2015-01-21 NOTE — Progress Notes (Signed)
Subjective:    Patient ID: Diana Pratt, female    DOB: [birth year], 69 y.o.   MRN: [id number]  HPI  69 year old female who is here to follow-up her diabetes. She also sees Child psychotherapist. Most recent A1c is 7.8. Current medications include extended-release metformin, Januvia, and Lantus insulin. She also has hyperlipidemia and is statin intolerant. We spent time talking about optimal diabetes treatment. She desires to take something at meals to cover her glucose rise and in concert with pharmacy will continue metformin, stop the Januvia, and put her on long and short acting insulin.  Patient Active Problem List   Diagnosis Date Noted  . Abdominal pain, acute, bilateral lower quadrant 07/01/2014  . Transaminitis 07/01/2014  . COPD (chronic obstructive pulmonary disease)   . Asthma   . Interstitial cystitis   . Unspecified vitamin D deficiency 05/29/2013  . Lumbar radiculopathy 05/29/2013  . Back strain 05/29/2013  . Unspecified asthma(493.90) 02/27/2013  . Depression with anxiety 02/22/2011  . Type 2 diabetes mellitus 02/22/2011  . Hyperlipemia 02/22/2011  . HTN (hypertension) 02/22/2011   Outpatient Encounter Prescriptions as of 01/21/2015  Medication Sig  . acetaminophen (TYLENOL) 325 MG tablet Take 650 mg by mouth every 6 (six) hours as needed. pain  . albuterol (PROAIR HFA) 108 (90 BASE) MCG/ACT inhaler Inhale 2 puffs into the lungs every 6 (six) hours as needed for shortness of breath.  Marland Kitchen albuterol (PROVENTIL) (2.5 MG/3ML) 0.083% nebulizer solution Take 2.5 mg by nebulization every 6 (six) hours as needed. Shortness of breath   . amLODipine-valsartan (EXFORGE) 10-320 MG per tablet Take 1 tablet by mouth daily.  Marland Kitchen aspirin 81 MG EC tablet Take 81 mg by mouth daily.    . budesonide-formoterol (SYMBICORT) 160-4.5 MCG/ACT inhaler Inhale 2 puffs into the lungs 2 (two) times daily.  . calcium carbonate 200 MG capsule Take 250 mg by mouth daily.   . Cholecalciferol (VITAMIN  D3) 2000 UNITS capsule Take 2 capsules (4,000 Units total) by mouth daily.  Marland Kitchen escitalopram (LEXAPRO) 10 MG tablet Take 2 tablets (20 mg total) by mouth daily.  . fluticasone (FLONASE) 50 MCG/ACT nasal spray Place 2 sprays into the nose daily as needed. Congestion   . hydrochlorothiazide (HYDRODIURIL) 25 MG tablet Take 1 tablet (25 mg total) by mouth daily.  . hydroxypropyl methylcellulose (ISOPTO TEARS) 2.5 % ophthalmic solution Place 1 drop into both eyes daily as needed. for dry eyes   . Insulin Glargine (LANTUS SOLOSTAR) 100 UNIT/ML Solostar Pen Inject 29 Units into the skin daily at 10 pm. (Patient taking differently: Inject 34 Units into the skin daily at 10 pm. )  . Insulin Pen Needle (PEN NEEDLES) 31G X 6 MM MISC Use to administer insulin once qd. Dx E11.59 Use Leader brand  . LORazepam (ATIVAN) 1 MG tablet TAKE 1 TABLET TWICE DAILY AS NEEDED FOR ANXIETY  . metFORMIN (GLUCOPHAGE-XR) 500 MG 24 hr tablet Take 1 tablet (500 mg total) by mouth daily with breakfast.  . montelukast (SINGULAIR) 10 MG tablet Take 10 mg by mouth at bedtime.   . Multiple Vitamins-Minerals (CENTRUM SILVER PO) Take 1 tablet by mouth daily.   . promethazine (PHENERGAN) 25 MG tablet Take 1 tablet (25 mg total) by mouth every 6 (six) hours as needed for nausea or vomiting.  . sitaGLIPtin (JANUVIA) 100 MG tablet Take 1 tablet (100 mg total) by mouth daily.     Review of Systems  Constitutional: Negative.   HENT: Positive for congestion and sneezing.  Respiratory: Negative.   Cardiovascular: Negative.   Gastrointestinal: Negative.   Neurological: Negative.   Psychiatric/Behavioral: Negative.        Objective:   Physical Exam  Constitutional: She is oriented to person, place, and time. She appears well-developed and well-nourished.  Cardiovascular: Normal rate and regular rhythm.   Pulmonary/Chest: Effort normal and breath sounds normal.  Musculoskeletal: Normal range of motion.  Neurological: She is alert  and oriented to person, place, and time.    BP 157/85 mmHg  Pulse 103  Temp(Src) 97.3 F (36.3 C) (Oral)  Ht 5\' 4"  (1.626 m)  Wt 203 lb (92.08 kg)  BMI 34.83 kg/m2       Assessment & Plan:  1. Generalized anxiety disorder  - escitalopram (LEXAPRO) 10 MG tablet; Take 2 tablets (20 mg total) by mouth daily.  Dispense: 30 tablet; Refill: 5 5  2. Essential hypertension Blood pressure is fairly controlled with hydrochlorothiazide. I'm not sure why she is not on an ACE inhibitor or an arb  3. Type 2 diabetes mellitus without complication Plan is as described above. Discontinue Januvia and begin Apidra 4 units with meals and monitor sugars 2 hours after meals. She has an appointment for follow-up in one month but I expect her to call in the interim with results of this regimen  Wardell Honour MD

## 2015-01-21 NOTE — Progress Notes (Signed)
Apidra needs a prior authorization.  Patient has Novolog flex pen at home. Discussed with Dr Sabra Heck and Suanne Marker at [facility]. Patient can substitute the Novolog equally.  Patient aware.

## 2015-01-26 ENCOUNTER — Other Ambulatory Visit: Payer: Self-pay | Admitting: *Deleted

## 2015-01-26 MED ORDER — INSULIN LISPRO 100 UNIT/ML (KWIKPEN): 4.0000 [IU] | PEN_INJECTOR | Freq: Three times a day (TID) | SUBCUTANEOUS | Status: DC

## 2015-01-26 NOTE — Telephone Encounter (Signed)
Apidra isn't covered by insurance.  Changed to Humalog Elinor Dodge which is preferred on her insurance.  Change faxed to Pharmacy.

## 2015-02-18 ENCOUNTER — Ambulatory Visit: Payer: Self-pay

## 2015-03-18 ENCOUNTER — Other Ambulatory Visit: Payer: Self-pay | Admitting: *Deleted

## 2015-03-18 ENCOUNTER — Emergency Department (HOSPITAL_COMMUNITY)
Admission: EM | Admit: 2015-03-18 | Discharge: 2015-03-18 | Disposition: A | Discharge status: Home / Self Care | Payer: Medicare Other | Attending: Emergency Medicine | Admitting: Emergency Medicine

## 2015-03-18 ENCOUNTER — Encounter (HOSPITAL_COMMUNITY): Payer: Self-pay | Admitting: *Deleted

## 2015-03-18 ENCOUNTER — Emergency Department (HOSPITAL_COMMUNITY): Payer: Medicare Other

## 2015-03-18 DIAGNOSIS — Y9289 Other specified places as the place of occurrence of the external cause: Secondary | ICD-10-CM | POA: Diagnosis not present

## 2015-03-18 DIAGNOSIS — E1140 Type 2 diabetes mellitus with diabetic neuropathy, unspecified: Secondary | ICD-10-CM | POA: Insufficient documentation

## 2015-03-18 DIAGNOSIS — S43402A Unspecified sprain of left shoulder joint, initial encounter: Secondary | ICD-10-CM | POA: Diagnosis not present

## 2015-03-18 DIAGNOSIS — S4992XA Unspecified injury of left shoulder and upper arm, initial encounter: Secondary | ICD-10-CM | POA: Diagnosis present

## 2015-03-18 DIAGNOSIS — F329 Major depressive disorder, single episode, unspecified: Secondary | ICD-10-CM | POA: Diagnosis not present

## 2015-03-18 DIAGNOSIS — Z794 Long term (current) use of insulin: Secondary | ICD-10-CM | POA: Diagnosis not present

## 2015-03-18 DIAGNOSIS — S40022A Contusion of left upper arm, initial encounter: Secondary | ICD-10-CM | POA: Diagnosis not present

## 2015-03-18 DIAGNOSIS — Z7982 Long term (current) use of aspirin: Secondary | ICD-10-CM | POA: Insufficient documentation

## 2015-03-18 DIAGNOSIS — Z87891 Personal history of nicotine dependence: Secondary | ICD-10-CM | POA: Diagnosis not present

## 2015-03-18 DIAGNOSIS — J449 Chronic obstructive pulmonary disease, unspecified: Secondary | ICD-10-CM | POA: Insufficient documentation

## 2015-03-18 DIAGNOSIS — I10 Essential (primary) hypertension: Secondary | ICD-10-CM | POA: Diagnosis not present

## 2015-03-18 DIAGNOSIS — Y998 Other external cause status: Secondary | ICD-10-CM | POA: Insufficient documentation

## 2015-03-18 DIAGNOSIS — Z87448 Personal history of other diseases of urinary system: Secondary | ICD-10-CM | POA: Diagnosis not present

## 2015-03-18 DIAGNOSIS — Z7951 Long term (current) use of inhaled steroids: Secondary | ICD-10-CM | POA: Diagnosis not present

## 2015-03-18 DIAGNOSIS — Z792 Long term (current) use of antibiotics: Secondary | ICD-10-CM | POA: Diagnosis not present

## 2015-03-18 DIAGNOSIS — F419 Anxiety disorder, unspecified: Secondary | ICD-10-CM | POA: Insufficient documentation

## 2015-03-18 DIAGNOSIS — Z79899 Other long term (current) drug therapy: Secondary | ICD-10-CM | POA: Insufficient documentation

## 2015-03-18 DIAGNOSIS — Z880 Allergy status to penicillin: Secondary | ICD-10-CM | POA: Diagnosis not present

## 2015-03-18 DIAGNOSIS — Y9389 Activity, other specified: Secondary | ICD-10-CM | POA: Insufficient documentation

## 2015-03-18 DIAGNOSIS — W01198A Fall on same level from slipping, tripping and stumbling with subsequent striking against other object, initial encounter: Secondary | ICD-10-CM | POA: Diagnosis not present

## 2015-03-18 MED ORDER — AMLODIPINE BESYLATE-VALSARTAN 10-320 MG PO TABS: 1.0000 | ORAL_TABLET | Freq: Every day | ORAL | Status: DC

## 2015-03-18 MED ORDER — TRAMADOL HCL 50 MG PO TABS
50.0000 mg | ORAL_TABLET | Freq: Once | ORAL | Status: AC
  Administered 2015-03-18: 50 mg via ORAL
  Filled 2015-03-18: qty 1

## 2015-03-18 NOTE — ED Notes (Signed)
Pain lt upper arm and shoulder after fall. Tripped over cat.   Heard a "pop" in shoulder.

## 2015-03-18 NOTE — ED Provider Notes (Signed)
CSN: [id number]     Arrival date & time 03/18/15  1925 History   First MD Initiated Contact with Patient 03/18/15 2011     Chief Complaint  Patient presents with  . Fall     (Consider location/radiation/quality/duration/timing/severity/associated sxs/prior Treatment) Patient is a 69 y.o. female presenting with fall.  Fall This is a new problem. The current episode started today. The problem has been unchanged.  Diana Pratt is a 69 y.o. female who presents to the ED with left upper arm pain s/p fall. Patient states she tripped over a cat and fell and heard a pop when her arm hit the ground. . She denies any other injuries. The pain radiates to the shoulder.    Past Medical History  Diagnosis Date  . Hypertension   . Diabetes mellitus   . Anxiety   . Hyperlipemia   . Depression   . Interstitial cystitis   . COPD (chronic obstructive pulmonary disease)   . Asthma   . Back pain   . Diabetic neuropathy    Past Surgical History  Procedure Laterality Date  . Abdominal hysterectomy  03/1984    partial  . Colonoscopy N/A 07/19/2014    Procedure: COLONOSCOPY;  Surgeon: Danie Binder, MD;  Location: [facility];  Service: Endoscopy;  Laterality: N/A;  9:30   Family History  Problem Relation Age of Onset  . Parkinsonism Mother   . Colon cancer Father 50    MULTIPLE CO-MORBIDITIES  . Colon polyps Neg Hx   . Diabetes Paternal Aunt   . Diabetes Paternal Uncle   . Diabetes Paternal Grandmother    History  Substance Use Topics  . Smoking status: Former Smoker    Quit date: 06/01/1994  . Smokeless tobacco: Never Used  . Alcohol Use: Yes     Comment: once a month   OB History    Gravida Para Term Preterm AB TAB SAB Ectopic Multiple Living   2 1 1  1     1      Review of Systems Negative except as stated in HPI   Allergies  Lipitor; Morphine and related; Penicillins; Simvastatin; Sulfa antibiotics; Zetia; and Livalo  Home Medications   Prior to Admission  medications   Medication Sig Start Date End Date Taking? Authorizing Provider  acetaminophen (TYLENOL) 325 MG tablet Take 650 mg by mouth every 6 (six) hours as needed. pain   Yes Historical Provider, MD  albuterol (PROAIR HFA) 108 (90 BASE) MCG/ACT inhaler Inhale 2 puffs into the lungs every 6 (six) hours as needed for shortness of breath. 01/21/15  Yes Wardell Honour, MD  albuterol (PROVENTIL) (2.5 MG/3ML) 0.083% nebulizer solution Take 2.5 mg by nebulization every 6 (six) hours as needed. Shortness of breath    Yes Historical Provider, MD  amLODipine-valsartan (EXFORGE) 10-320 MG per tablet Take 1 tablet by mouth daily. 03/18/15  Yes Wardell Honour, MD  aspirin 81 MG EC tablet Take 81 mg by mouth daily.     Yes Historical Provider, MD  budesonide-formoterol (SYMBICORT) 160-4.5 MCG/ACT inhaler Inhale 2 puffs into the lungs 2 (two) times daily. 12/25/13  Yes Vernie Shanks, MD  calcium carbonate 200 MG capsule Take 250 mg by mouth daily.    Yes Historical Provider, MD  Cholecalciferol (VITAMIN D3) 2000 UNITS capsule Take 2 capsules (4,000 Units total) by mouth daily. 01/07/14  Yes Vernie Shanks, MD  escitalopram (LEXAPRO) 10 MG tablet Take 2 tablets (20 mg total) by mouth daily.  01/21/15  Yes Wardell Honour, MD  fluticasone (FLONASE) 50 MCG/ACT nasal spray Place 2 sprays into the nose daily as needed for allergies. Congestion    Yes Historical Provider, MD  hydrochlorothiazide (HYDRODIURIL) 25 MG tablet Take 1 tablet (25 mg total) by mouth daily. 07/21/14  Yes Wardell Honour, MD  hydroxypropyl methylcellulose (ISOPTO TEARS) 2.5 % ophthalmic solution Place 1 drop into both eyes daily as needed. for dry eyes    Yes Historical Provider, MD  Insulin Glargine (LANTUS SOLOSTAR) 100 UNIT/ML Solostar Pen Inject 29 Units into the skin daily at 10 pm. Patient taking differently: Inject 34 Units into the skin daily at 10 pm.  08/16/14  Yes Wardell Honour, MD  insulin lispro (HUMALOG) 100 UNIT/ML KiwkPen  Inject 0.04 mLs (4 Units total) into the skin 3 (three) times daily with meals. 01/26/15  Yes Wardell Honour, MD  LORazepam (ATIVAN) 1 MG tablet TAKE 1 TABLET TWICE DAILY AS NEEDED FOR ANXIETY Patient taking differently: Take 1 mg by mouth 2 (two) times daily as needed for anxiety.  01/21/15  Yes Wardell Honour, MD  metFORMIN (GLUCOPHAGE-XR) 500 MG 24 hr tablet Take 1 tablet (500 mg total) by mouth daily with breakfast. 12/09/14  Yes Tammy Eckard, PHARMD  montelukast (SINGULAIR) 10 MG tablet Take 1 tablet (10 mg total) by mouth at bedtime. 01/21/15  Yes Wardell Honour, MD  Multiple Vitamins-Minerals (CENTRUM SILVER PO) Take 1 tablet by mouth daily.    Yes Historical Provider, MD  mupirocin ointment (BACTROBAN) 2 % Apply 1 application topically 3 (three) times daily. 01/21/15  Yes Wardell Honour, MD  promethazine (PHENERGAN) 25 MG tablet Take 1 tablet (25 mg total) by mouth every 6 (six) hours as needed for nausea or vomiting. 05/31/14  Yes Milton Ferguson, MD  Insulin Pen Needle (PEN NEEDLES) 31G X 6 MM MISC Use to administer insulin once qd. Dx E11.59 Use Leader brand 08/16/14   Wardell Honour, MD  sitaGLIPtin (JANUVIA) 100 MG tablet Take 1 tablet (100 mg total) by mouth daily. Patient not taking: Reported on 03/18/2015 07/21/14   Wardell Honour, MD   BP 124/51 mmHg  Pulse 80  Temp(Src) 97.7 F (36.5 C) (Oral)  Resp 22  Ht 5\' 4"  (1.626 m)  Wt 195 lb (88.451 kg)  BMI 33.46 kg/m2  SpO2 98% Physical Exam  Constitutional: She is oriented to person, place, and time. She appears well-developed and well-nourished.  HENT:  Head: Normocephalic.  Eyes: EOM are normal.  Neck: Neck supple.  Cardiovascular: Normal rate.   Pulmonary/Chest: Effort normal.  Abdominal: Soft. There is no tenderness.  Musculoskeletal:       Left upper arm: She exhibits tenderness and swelling. She exhibits no deformity and no laceration.  Radial pulses 2+ bilateral, slightly decreased grip left due to pain. Pain with  palpation of the upper arm left, swelling noted, hematoma.   Neurological: She is alert and oriented to person, place, and time. No cranial nerve deficit.  Skin: Skin is warm and dry.  Psychiatric: She has a normal mood and affect. Her behavior is normal.  Nursing note and vitals reviewed.   ED Course  Procedures (including critical care time)  Dr. Eulis Foster in to examine the patient.   Labs Review Labs Reviewed - No data to display  Imaging Review Dg Humerus Left  03/18/2015   CLINICAL DATA:  Left arm pain after fall after tripping over cat. Initial encounter.  EXAM: LEFT HUMERUS - 2+  VIEW  COMPARISON:  None.  FINDINGS: There is no evidence of fracture or other focal bone lesions. Soft tissues are unremarkable.  IMPRESSION: Normal left humerus.   Electronically Signed   By: Marijo Conception, M.D.   On: 03/18/2015 20:48     MDM  69 y.o. female with pain and swelling of the left upper arm s/p fall. Stable for d/c without neurovascular compromise. Arm sling, ice, Aleve, rest and return as needed. Discussed with the patient and all questioned fully answered.   Final diagnoses:  Contusion of left upper arm, initial encounter       [facility], NP 03/19/15 [id number]  Daleen Bo, MD 03/19/15 1447

## 2015-03-18 NOTE — Discharge Instructions (Signed)
Take Aleve for pain, use the sling for the next few days for comfort. Apply ice and rest the area. Return if symptoms persist.

## 2015-03-19 NOTE — ED Provider Notes (Signed)
  Face-to-face evaluation   History: She is here for mechanical fall with isolated injury to the left upper arm.  Physical exam: Alert, cooperative. Vital arm with lateral mid tenderness and swelling, localized without associated abrasion or laceration. Decreased motion, left arm, secondary to pain in left upper arm.  Medical screening examination/treatment/procedure(s) were conducted as a shared visit with non-physician practitioner(s) and myself.  I personally evaluated the patient during the encounter  Daleen Bo, MD 03/19/15 1447

## 2015-04-08 ENCOUNTER — Ambulatory Visit: Payer: Self-pay

## 2015-05-20 ENCOUNTER — Encounter: Payer: Self-pay | Admitting: Family Medicine

## 2015-05-20 ENCOUNTER — Ambulatory Visit (INDEPENDENT_AMBULATORY_CARE_PROVIDER_SITE_OTHER): Payer: Medicare Other

## 2015-05-20 ENCOUNTER — Ambulatory Visit (INDEPENDENT_AMBULATORY_CARE_PROVIDER_SITE_OTHER): Payer: Medicare Other | Admitting: Family Medicine

## 2015-05-20 VITALS — BP 157/80 | HR 89 | Temp 97.3°F | Ht 64.0 in | Wt 201.0 lb

## 2015-05-20 DIAGNOSIS — F418 Other specified anxiety disorders: Secondary | ICD-10-CM | POA: Diagnosis not present

## 2015-05-20 DIAGNOSIS — F411 Generalized anxiety disorder: Secondary | ICD-10-CM | POA: Diagnosis not present

## 2015-05-20 DIAGNOSIS — M79622 Pain in left upper arm: Secondary | ICD-10-CM | POA: Diagnosis not present

## 2015-05-20 DIAGNOSIS — M25522 Pain in left elbow: Secondary | ICD-10-CM

## 2015-05-20 DIAGNOSIS — J449 Chronic obstructive pulmonary disease, unspecified: Secondary | ICD-10-CM | POA: Diagnosis not present

## 2015-05-20 DIAGNOSIS — E119 Type 2 diabetes mellitus without complications: Secondary | ICD-10-CM

## 2015-05-20 DIAGNOSIS — I10 Essential (primary) hypertension: Secondary | ICD-10-CM | POA: Diagnosis not present

## 2015-05-20 DIAGNOSIS — E785 Hyperlipidemia, unspecified: Secondary | ICD-10-CM | POA: Diagnosis not present

## 2015-05-20 LAB — POCT GLYCOSYLATED HEMOGLOBIN (HGB A1C): HEMOGLOBIN A1C: 9.4

## 2015-05-20 MED ORDER — AMLODIPINE BESYLATE-VALSARTAN 10-320 MG PO TABS: 1.0000 | ORAL_TABLET | Freq: Every day | ORAL | Status: DC

## 2015-05-20 MED ORDER — INSULIN GLARGINE 100 UNIT/ML SOLOSTAR PEN: 34.0000 [IU] | PEN_INJECTOR | Freq: Every day | SUBCUTANEOUS | Status: DC

## 2015-05-20 MED ORDER — MONTELUKAST SODIUM 10 MG PO TABS
10.0000 mg | ORAL_TABLET | Freq: Every day | ORAL | Status: DC
Start: 2015-05-20 — End: 2016-01-20

## 2015-05-20 MED ORDER — LORAZEPAM 1 MG PO TABS: ORAL_TABLET | ORAL | Status: DC

## 2015-05-20 MED ORDER — INSULIN LISPRO 100 UNIT/ML (KWIKPEN): 4.0000 [IU] | PEN_INJECTOR | Freq: Three times a day (TID) | SUBCUTANEOUS | Status: DC

## 2015-05-20 MED ORDER — ESCITALOPRAM OXALATE 10 MG PO TABS: 20.0000 mg | ORAL_TABLET | Freq: Every day | ORAL | Status: DC

## 2015-05-20 MED ORDER — TRAMADOL HCL 50 MG PO TABS: 50.0000 mg | ORAL_TABLET | Freq: Three times a day (TID) | ORAL | Status: DC

## 2015-05-20 NOTE — Progress Notes (Signed)
Subjective:    Patient ID: Diana Pratt, female    DOB: 06/01/1946, 69 y.o.   MRN: [id number]  HPI 69 year old female who is followed for diabetes. Last time at her visit we added short acting insulin and Lantus to her metformin. She wants to discontinue the metformin because of GI saw side effects. She is willing to use to testing and use needles. Current doses Lantus 29 units at bedtime and Humalog 4 units with meals talked about how to titrate Lantus based on her fasting sugars and varying the Humalog dose depending on size of the medial.  She complains of pain in her left upper arm today she's had several recent falls. After one fall she was evaluated in the hospital with x-ray showed no bony injury but this was back over a month ago when she still has pain.    Review of Systems  Constitutional: Negative.   HENT: Negative.   Respiratory: Negative.   Cardiovascular: Negative.   Gastrointestinal: Negative.   Neurological: Negative.   Psychiatric/Behavioral: Negative.        Objective:   Physical Exam  Constitutional: She is oriented to person, place, and time. She appears well-developed and well-nourished.  Cardiovascular: Normal rate and normal heart sounds.   Pulmonary/Chest: Effort normal and breath sounds normal.  Musculoskeletal:  Tenderness on palpation the left upper arm. There is no discoloration, bruising, or deformity. Elbow and shoulder are nontender  Neurological: She is alert and oriented to person, place, and time.          Assessment & Plan:  1. Type 2 diabetes mellitus without complication  - POCT glycosylated hemoglobin (Hb A1C)  2. Essential hypertension  - amLODipine-valsartan (EXFORGE) 10-320 MG per tablet; Take 1 tablet by mouth daily.  Dispense: 90 tablet; Refill: 1  3. Hyperlipemia   4. Chronic obstructive pulmonary disease, unspecified COPD, unspecified chronic bronchitis type   5. Depression with anxiety   6. Pain in joint, upper  arm, left Rx for tramadol for pain - DG Humerus Left; Future  7. Generalized anxiety disorder   Wardell Honour MD - escitalopram (LEXAPRO) 10 MG tablet; Take 2 tablets (20 mg total) by mouth daily.  Dispense: 180 tablet; Refill: 1  Patient Active Problem List   Diagnosis Date Noted  . Abdominal pain, acute, bilateral lower quadrant 07/01/2014  . Transaminitis 07/01/2014  . COPD (chronic obstructive pulmonary disease)   . Asthma   . Interstitial cystitis   . Unspecified vitamin D deficiency 05/29/2013  . Lumbar radiculopathy 05/29/2013  . Back strain 05/29/2013  . Unspecified asthma(493.90) 02/27/2013  . Depression with anxiety 02/22/2011  . Type 2 diabetes mellitus 02/22/2011  . Hyperlipemia 02/22/2011  . HTN (hypertension) 02/22/2011   Outpatient Encounter Prescriptions as of 05/20/2015  Medication Sig  . acetaminophen (TYLENOL) 325 MG tablet Take 650 mg by mouth every 6 (six) hours as needed. pain  . albuterol (PROAIR HFA) 108 (90 BASE) MCG/ACT inhaler Inhale 2 puffs into the lungs every 6 (six) hours as needed for shortness of breath.  Marland Kitchen albuterol (PROVENTIL) (2.5 MG/3ML) 0.083% nebulizer solution Take 2.5 mg by nebulization every 6 (six) hours as needed. Shortness of breath   . amLODipine-valsartan (EXFORGE) 10-320 MG per tablet Take 1 tablet by mouth daily.  Marland Kitchen aspirin 81 MG EC tablet Take 81 mg by mouth daily.    . budesonide-formoterol (SYMBICORT) 160-4.5 MCG/ACT inhaler Inhale 2 puffs into the lungs 2 (two) times daily.  . calcium carbonate 200 MG capsule  Take 250 mg by mouth daily.   . Cholecalciferol (VITAMIN D3) 2000 UNITS capsule Take 2 capsules (4,000 Units total) by mouth daily.  Marland Kitchen escitalopram (LEXAPRO) 10 MG tablet Take 2 tablets (20 mg total) by mouth daily.  . fluticasone (FLONASE) 50 MCG/ACT nasal spray Place 2 sprays into the nose daily as needed for allergies. Congestion   . hydrochlorothiazide (HYDRODIURIL) 25 MG tablet Take 1 tablet (25 mg total) by mouth  daily.  . hydroxypropyl methylcellulose (ISOPTO TEARS) 2.5 % ophthalmic solution Place 1 drop into both eyes daily as needed. for dry eyes   . Insulin Glargine (LANTUS SOLOSTAR) 100 UNIT/ML Solostar Pen Inject 29 Units into the skin daily at 10 pm. (Patient taking differently: Inject 34 Units into the skin daily at 10 pm. )  . insulin lispro (HUMALOG) 100 UNIT/ML KiwkPen Inject 0.04 mLs (4 Units total) into the skin 3 (three) times daily with meals.  . Insulin Pen Needle (PEN NEEDLES) 31G X 6 MM MISC Use to administer insulin once qd. Dx E11.59 Use Leader brand  . LORazepam (ATIVAN) 1 MG tablet TAKE 1 TABLET TWICE DAILY AS NEEDED FOR ANXIETY (Patient taking differently: Take 1 mg by mouth 2 (two) times daily as needed for anxiety. )  . metFORMIN (GLUCOPHAGE-XR) 500 MG 24 hr tablet Take 1 tablet (500 mg total) by mouth daily with breakfast. (Patient not taking: Reported on 05/20/2015)  . montelukast (SINGULAIR) 10 MG tablet Take 1 tablet (10 mg total) by mouth at bedtime.  . Multiple Vitamins-Minerals (CENTRUM SILVER PO) Take 1 tablet by mouth daily.   . mupirocin ointment (BACTROBAN) 2 % Apply 1 application topically 3 (three) times daily.  . promethazine (PHENERGAN) 25 MG tablet Take 1 tablet (25 mg total) by mouth every 6 (six) hours as needed for nausea or vomiting.  . [DISCONTINUED] sitaGLIPtin (JANUVIA) 100 MG tablet Take 1 tablet (100 mg total) by mouth daily. (Patient not taking: Reported on 03/18/2015)  . [DISCONTINUED] levalbuterol (XOPENEX) nebulizer solution 1.25 mg    No facility-administered encounter medications on file as of 05/20/2015.

## 2015-06-09 ENCOUNTER — Other Ambulatory Visit: Payer: Self-pay | Admitting: Pharmacist

## 2015-06-17 ENCOUNTER — Ambulatory Visit: Payer: Self-pay

## 2015-07-25 ENCOUNTER — Other Ambulatory Visit: Payer: Self-pay | Admitting: Family Medicine

## 2015-08-23 ENCOUNTER — Encounter: Payer: Self-pay | Admitting: Family Medicine

## 2015-09-28 ENCOUNTER — Ambulatory Visit (INDEPENDENT_AMBULATORY_CARE_PROVIDER_SITE_OTHER): Payer: Medicare Other | Admitting: Family Medicine

## 2015-09-28 ENCOUNTER — Encounter: Payer: Self-pay | Admitting: Family Medicine

## 2015-09-28 VITALS — Temp 96.8°F | Ht 64.0 in | Wt 200.0 lb

## 2015-09-28 DIAGNOSIS — E1100 Type 2 diabetes mellitus with hyperosmolarity without nonketotic hyperglycemic-hyperosmolar coma (NKHHC): Secondary | ICD-10-CM | POA: Diagnosis not present

## 2015-09-28 DIAGNOSIS — I10 Essential (primary) hypertension: Secondary | ICD-10-CM

## 2015-09-28 LAB — POCT GLYCOSYLATED HEMOGLOBIN (HGB A1C): HEMOGLOBIN A1C: 10.8

## 2015-09-28 MED ORDER — PITAVASTATIN CALCIUM 2 MG PO TABS: 2.0000 mg | ORAL_TABLET | Freq: Every day | ORAL | Status: DC

## 2015-09-28 MED ORDER — LORAZEPAM 1 MG PO TABS: ORAL_TABLET | ORAL | Status: DC

## 2015-09-28 NOTE — Progress Notes (Signed)
   Subjective:    Patient ID: Diana Pratt, female    DOB: [birth year], 69 y.o.   MRN: [id number]  HPI 69 year old female here to follow-up diabetes. She does not seem to take her diabetes is seriously as would like. With elevated A1c's she has multiple excuses including taking care of an elderly.and increased anxiety. Last A1c was 9.4. Currently she uses Lantus and Humalog. Doses are Lantus 36 units at nighttime and Humalog 4 units with meals. She also complains of pain in her left shoulder. Several the lateral aspect. She fell and was x-rayed initially with negative findings.    Review of Systems  Constitutional: Negative.   Respiratory: Negative.   Cardiovascular: Negative.   Neurological: Negative.   Psychiatric/Behavioral: Negative.       Temp(Src) 96.8 F (36 C) (Oral)  Ht 5\' 4"  (1.626 m)  Wt 200 lb (90.719 kg)  BMI 34.31 kg/m2  Objective:   Physical Exam  Constitutional: She appears well-developed and well-nourished.  HENT:  Head: Normocephalic.  Cardiovascular: Normal rate and regular rhythm.   Pulmonary/Chest: Effort normal and breath sounds normal.  Musculoskeletal:  Left lateral deltoid area injected with Kenalog and Marcaine at point of maximal tenderness after usual prep of the skin with alcohol          Assessment & Plan:  1. Essential hypertension Blood pressure is okay today could be a little better but  2. Type 2 diabetes mellitus with hyperosmolarity without coma, without long-term current use of insulin (HCC) Suspect A1c will still be elevated. We have a strategy for increasing her Lantus by 4 units per week depending on average fasting blood sugar if we can get that are normal then start working more specifically on Humalog.  Wardell Honour MD - POCT glycosylated hemoglobin (Hb A1C)

## 2015-10-03 ENCOUNTER — Telehealth: Payer: Self-pay

## 2015-10-03 NOTE — Telephone Encounter (Signed)
Insurance denied prior authorization for [employer]

## 2015-11-06 DIAGNOSIS — C801 Malignant (primary) neoplasm, unspecified: Secondary | ICD-10-CM

## 2015-11-06 HISTORY — DX: Malignant (primary) neoplasm, unspecified: C80.1

## 2015-12-21 ENCOUNTER — Telehealth: Payer: Self-pay | Admitting: Family Medicine

## 2016-01-06 ENCOUNTER — Other Ambulatory Visit: Payer: Self-pay | Admitting: Family Medicine

## 2016-01-06 NOTE — Telephone Encounter (Signed)
Last filled 12/09/15, last seen 09/28/15. Call in rx

## 2016-01-09 ENCOUNTER — Other Ambulatory Visit: Payer: Self-pay | Admitting: *Deleted

## 2016-01-09 DIAGNOSIS — I10 Essential (primary) hypertension: Secondary | ICD-10-CM

## 2016-01-09 MED ORDER — HYDROCHLOROTHIAZIDE 25 MG PO TABS: 25.0000 mg | ORAL_TABLET | Freq: Every day | ORAL | Status: DC

## 2016-01-09 MED ORDER — AMLODIPINE BESYLATE-VALSARTAN 10-320 MG PO TABS: 1.0000 | ORAL_TABLET | Freq: Every day | ORAL | Status: DC

## 2016-01-09 NOTE — Telephone Encounter (Signed)
rx called in

## 2016-01-20 ENCOUNTER — Encounter: Payer: Self-pay | Admitting: Family Medicine

## 2016-01-20 ENCOUNTER — Ambulatory Visit (INDEPENDENT_AMBULATORY_CARE_PROVIDER_SITE_OTHER): Payer: Medicare Other | Admitting: Family Medicine

## 2016-01-20 VITALS — BP 136/77 | HR 120 | Temp 97.0°F | Ht 64.0 in | Wt 209.0 lb

## 2016-01-20 DIAGNOSIS — I10 Essential (primary) hypertension: Secondary | ICD-10-CM

## 2016-01-20 DIAGNOSIS — N898 Other specified noninflammatory disorders of vagina: Secondary | ICD-10-CM

## 2016-01-20 DIAGNOSIS — E1100 Type 2 diabetes mellitus with hyperosmolarity without nonketotic hyperglycemic-hyperosmolar coma (NKHHC): Secondary | ICD-10-CM

## 2016-01-20 DIAGNOSIS — E785 Hyperlipidemia, unspecified: Secondary | ICD-10-CM

## 2016-01-20 MED ORDER — FLUCONAZOLE 150 MG PO TABS: 150.0000 mg | ORAL_TABLET | Freq: Once | ORAL | Status: DC

## 2016-01-20 MED ORDER — METFORMIN HCL ER 500 MG PO TB24: 500.0000 mg | ORAL_TABLET | Freq: Every day | ORAL | Status: DC

## 2016-01-20 MED ORDER — LORAZEPAM 1 MG PO TABS: ORAL_TABLET | ORAL | Status: DC

## 2016-01-20 NOTE — Progress Notes (Signed)
Subjective:    Patient ID: Diana Pratt, female    DOB: 03-28-1946, 70 y.o.   MRN: [id number]  HPI Pt here for follow up and management of chronic medical problems which includes diabetes and hyperlipidemia. She is taking medications regularly. We did not do blood work today as patient was not fasting but she admits she still having major issues with control of her sugar. She likes to eat sweets. Currently she's been taking Humalog and Lantus. Had previously been on metformin but had GI upset with that. She is willing to try that again. Complains of some trouble sleeping but has been out of her lorazepam. She also has some right shoulder pains. She had a fracture in that area about 3 years ago. Also complains of some itching with probable yeast infection based on her previous experience and history. Her insurance would not approve pitavastatin; will consider Repatha if eligibility criteria can be met.      Patient Active Problem List   Diagnosis Date Noted  . Abdominal pain, acute, bilateral lower quadrant 07/01/2014  . Transaminitis 07/01/2014  . COPD (chronic obstructive pulmonary disease) ([place])   . Asthma   . Interstitial cystitis   . Unspecified vitamin D deficiency 05/29/2013  . Lumbar radiculopathy 05/29/2013  . Back strain 05/29/2013  . Unspecified asthma(493.90) 02/27/2013  . Depression with anxiety 02/22/2011  . Type 2 diabetes mellitus ([place]) 02/22/2011  . Hyperlipemia 02/22/2011  . HTN (hypertension) 02/22/2011   Outpatient Encounter Prescriptions as of 01/20/2016  Medication Sig  . acetaminophen (TYLENOL) 325 MG tablet Take 650 mg by mouth every 6 (six) hours as needed. pain  . albuterol (PROAIR HFA) 108 (90 BASE) MCG/ACT inhaler Inhale 2 puffs into the lungs every 6 (six) hours as needed for shortness of breath.  Marland Kitchen albuterol (PROVENTIL) (2.5 MG/3ML) 0.083% nebulizer solution Take 2.5 mg by nebulization every 6 (six) hours as needed. Shortness of breath   .  amLODipine-valsartan (EXFORGE) 10-320 MG tablet Take 1 tablet by mouth daily.  Marland Kitchen aspirin 81 MG EC tablet Take 81 mg by mouth daily.    . budesonide-formoterol (SYMBICORT) 160-4.5 MCG/ACT inhaler Inhale 2 puffs into the lungs 2 (two) times daily.  . calcium carbonate 200 MG capsule Take 250 mg by mouth daily.   . Cholecalciferol (VITAMIN D3) 2000 UNITS capsule Take 2 capsules (4,000 Units total) by mouth daily.  Marland Kitchen escitalopram (LEXAPRO) 10 MG tablet Take 2 tablets (20 mg total) by mouth daily.  . fluticasone (FLONASE) 50 MCG/ACT nasal spray Place 2 sprays into the nose daily as needed for allergies. Congestion   . hydrochlorothiazide (HYDRODIURIL) 25 MG tablet Take 1 tablet (25 mg total) by mouth daily.  . hydroxypropyl methylcellulose (ISOPTO TEARS) 2.5 % ophthalmic solution Place 1 drop into both eyes daily as needed. for dry eyes   . Insulin Glargine (LANTUS SOLOSTAR) 100 UNIT/ML Solostar Pen Inject 34 Units into the skin daily at 10 pm.  . insulin lispro (HUMALOG) 100 UNIT/ML KiwkPen Inject 0.04 mLs (4 Units total) into the skin 3 (three) times daily with meals.  . Insulin Pen Needle (PEN NEEDLES) 31G X 6 MM MISC Use to administer insulin once qd. Dx E11.59 Use Leader brand  . LORazepam (ATIVAN) 1 MG tablet TAKE 1 TABLET TWICE DAILY AS NEEDED FOR ANXIETY  . Multiple Vitamins-Minerals (CENTRUM SILVER PO) Take 1 tablet by mouth daily.   . Pitavastatin Calcium 2 MG TABS Take 1 tablet (2 mg total) by mouth daily.  . [DISCONTINUED] montelukast (  SINGULAIR) 10 MG tablet Take 1 tablet (10 mg total) by mouth at bedtime.  . promethazine (PHENERGAN) 25 MG tablet Take 1 tablet (25 mg total) by mouth every 6 (six) hours as needed for nausea or vomiting. (Patient not taking: Reported on 01/20/2016)  . [DISCONTINUED] mupirocin ointment (BACTROBAN) 2 % Apply 1 application topically 3 (three) times daily.  . [DISCONTINUED] traMADol (ULTRAM) 50 MG tablet Take 1 tablet (50 mg total) by mouth 3 (three) times daily.  (Patient not taking: Reported on 09/28/2015)   No facility-administered encounter medications on file as of 01/20/2016.     Review of Systems  Constitutional: Negative.        Trouble sleeping  HENT: Negative.   Eyes: Negative.   Respiratory: Negative.   Cardiovascular: Negative.   Gastrointestinal: Negative.   Endocrine: Negative.        Elevated BS  Genitourinary: Positive for vaginal discharge (and itching).  Musculoskeletal: Positive for arthralgias. Back pain: right shoulder pain.  Skin: Negative.   Allergic/Immunologic: Negative.   Neurological: Negative.   Hematological: Negative.   Psychiatric/Behavioral: Negative.        Objective:   Physical Exam  Constitutional: She is oriented to person, place, and time. She appears well-developed and well-nourished.  Cardiovascular: Normal rate, regular rhythm, normal heart sounds and intact distal pulses.   Pulmonary/Chest: Breath sounds normal.  Abdominal: Soft. Bowel sounds are normal.  Neurological: She is alert and oriented to person, place, and time.    BP 136/77 mmHg  Pulse 120  Temp(Src) 97 F (36.1 C) (Oral)  Ht '5\' 4"'  (1.626 m)  Wt 209 lb (94.802 kg)  BMI 35.86 kg/m2  SpO2 96%       Assessment & Plan:  1. Essential hypertension Blood pressures are fairly well controlled on amlodipine valsartan hydrochlorothiazide. - CMP14+EGFR; Future  2. Type 2 diabetes mellitus with hyperosmolarity without coma, without long-term current use of insulin ([place]) Diabetes is poorly controlled. Will add Glucophage X are hoping that will reduce her GI side effects - Bayer DCA Hb A1c Waived; Future - Microalbumin / creatinine urine ratio  3. Hyperlipemia;  Would like to see if she might be eligible for Repatha; LDL cholesterol is 206 - Lipid panel; Future  4. Vaginal discharge Zyrtec typical of monilia will treat empirically with Diflucan 150 mg  Wardell Honour MD - [facility], [place], [place]

## 2016-01-20 NOTE — Patient Instructions (Signed)
Medicare Annual Wellness Visit  [place] and the medical providers at [facility] strive to bring you the best medical care.  In doing so we not only want to address your current medical conditions and concerns but also to detect new conditions early and prevent illness, disease and health-related problems.    Medicare offers a yearly Wellness Visit which allows our clinical staff to assess your need for preventative services including immunizations, lifestyle education, counseling to decrease risk of preventable diseases and screening for fall risk and other medical concerns.    This visit is provided free of charge (no copay) for all Medicare recipients. The clinical pharmacists at [facility] have begun to conduct these Wellness Visits which will also include a thorough review of all your medications.    As you primary medical provider recommend that you make an appointment for your Annual Wellness Visit if you have not done so already this year.  You may set up this appointment before you leave today or you may call back ([phone]) and schedule an appointment.  Please make sure when you call that you mention that you are scheduling your Annual Wellness Visit with the clinical pharmacist so that the appointment may be made for the proper length of time.     Continue current medications. Continue good therapeutic lifestyle changes which include good diet and exercise. Fall precautions discussed with patient. If an FOBT was given today- please return it to our front desk. If you are over 50 years old - you may need Prevnar 13 or the adult Pneumonia vaccine.  **Flu shots are available--- please call and schedule a FLU-CLINIC appointment**  After your visit with us today you will receive a survey in the mail or online from [employer] regarding your care with us. Please take a moment to fill this out. Your feedback is very  important to us as you can help us better understand your patient needs as well as improve your experience and satisfaction. WE CARE ABOUT YOU!!!    

## 2016-02-02 ENCOUNTER — Other Ambulatory Visit: Payer: Self-pay

## 2016-02-02 MED ORDER — METFORMIN HCL ER 500 MG PO TB24: 500.0000 mg | ORAL_TABLET | Freq: Every day | ORAL | Status: DC

## 2016-03-21 ENCOUNTER — Telehealth: Payer: Self-pay | Admitting: Family Medicine

## 2016-03-21 ENCOUNTER — Encounter: Payer: Self-pay | Admitting: Pharmacist

## 2016-03-21 ENCOUNTER — Ambulatory Visit (INDEPENDENT_AMBULATORY_CARE_PROVIDER_SITE_OTHER): Payer: Medicare Other | Admitting: Pharmacist

## 2016-03-21 VITALS — BP 112/72 | HR 78 | Ht 64.0 in | Wt 207.0 lb

## 2016-03-21 DIAGNOSIS — R300 Dysuria: Secondary | ICD-10-CM | POA: Diagnosis not present

## 2016-03-21 DIAGNOSIS — E1165 Type 2 diabetes mellitus with hyperglycemia: Secondary | ICD-10-CM

## 2016-03-21 DIAGNOSIS — E1140 Type 2 diabetes mellitus with diabetic neuropathy, unspecified: Secondary | ICD-10-CM | POA: Diagnosis not present

## 2016-03-21 DIAGNOSIS — E785 Hyperlipidemia, unspecified: Secondary | ICD-10-CM

## 2016-03-21 DIAGNOSIS — Z794 Long term (current) use of insulin: Secondary | ICD-10-CM

## 2016-03-21 DIAGNOSIS — Z79899 Other long term (current) drug therapy: Secondary | ICD-10-CM | POA: Diagnosis not present

## 2016-03-21 DIAGNOSIS — E559 Vitamin D deficiency, unspecified: Secondary | ICD-10-CM | POA: Diagnosis not present

## 2016-03-21 DIAGNOSIS — IMO0002 Reserved for concepts with insufficient information to code with codable children: Secondary | ICD-10-CM

## 2016-03-21 LAB — GLUCOSE HEMOCUE WAIVED: Glu Hemocue Waived: 79 mg/dL (ref 65–99)

## 2016-03-21 LAB — URINALYSIS, COMPLETE
BILIRUBIN UA: NEGATIVE
GLUCOSE, UA: NEGATIVE
KETONES UA: NEGATIVE
LEUKOCYTES UA: NEGATIVE
Nitrite, UA: NEGATIVE
PH UA: 5 (ref 5.0–7.5)
PROTEIN UA: NEGATIVE
RBC UA: NEGATIVE
Urobilinogen, Ur: 0.2 mg/dL (ref 0.2–1.0)

## 2016-03-21 LAB — BAYER DCA HB A1C WAIVED: HB A1C (BAYER DCA - WAIVED): 9.7 % — ABNORMAL HIGH (ref <7.0)

## 2016-03-21 LAB — MICROSCOPIC EXAMINATION: Epithelial Cells (non renal): >10 /hpf — AB (ref 0–10)

## 2016-03-21 MED ORDER — SITAGLIPTIN PHOSPHATE 100 MG PO TABS: 100.0000 mg | ORAL_TABLET | Freq: Every day | ORAL | Status: DC

## 2016-03-21 MED ORDER — BLOOD GLUCOSE MONITOR KIT: PACK | Status: DC

## 2016-03-21 MED ORDER — INSULIN GLARGINE 100 UNIT/ML SOLOSTAR PEN: 50.0000 [IU] | PEN_INJECTOR | Freq: Every day | SUBCUTANEOUS | Status: DC

## 2016-03-21 MED ORDER — INSULIN LISPRO 100 UNIT/ML (KWIKPEN): 6.0000 [IU] | PEN_INJECTOR | Freq: Three times a day (TID) | SUBCUTANEOUS | Status: DC

## 2016-03-21 MED ORDER — ROSUVASTATIN CALCIUM 10 MG PO TABS: 10.0000 mg | ORAL_TABLET | Freq: Every day | ORAL | Status: DC

## 2016-03-21 NOTE — Progress Notes (Signed)
Subjective:    Diana Pratt is a 70 y.o. female who presents for an evaluation of Type 2 diabetes mellitus.   She is very anxious today because her BG has been over 200 for the last 4-5 days.  She is currently taking Lantus 50 units daily, Humalog 6 units tid.  She has been taking metformin but this caused nausea and diarrhea and she stopped several months ago.  She took Januvia in the past and felt that her BG was better controlled.  She is unsure why Januvia was stopped.  Possibly due to cost.   The patient was initially diagnosed with Type 2 diabetes mellitus in the 1990's  Known diabetic complications: peripheral neuropathy Cardiovascular risk factors: advanced age (older than 30 for men, 30 for women), diabetes mellitus, dyslipidemia, hypertension, obesity (BMI >= 30 kg/m2) and sedentary lifestyle  Weight trend: stable Prior visit with CDE - yes, last visit 12/2014 Current diet: in general, an "unhealthy" diet - patient states she lost diet information she received at last visit Current exercise: none  Current monitoring regimen: home blood tests - 2  times daily Home blood sugar records: BG has been over 200  Any episodes of hypoglycemia? no  Is She on ACE inhibitor or angiotensin II receptor blocker?  Yes    valsartan + amlodipine   Patient was taking Livalo 66m however she stopped because it was not covered by her insurance.   She is tried several other statins with similar results - atorvastatin, simvastatin.  She also has not been able to tolerate zetia - per her report it cause her to cough.  The following portions of the patient's history were reviewed and updated as appropriate: allergies, current medications, past family history, past medical history, past social history, past surgical history and problem list.    Objective:    BP 112/72 mmHg  Pulse 78  Ht _0  (1.626 m)  Wt 207 lb (93.895 kg)  BMI 35.51 kg/m2  A1c = 9.7% today RBG was 79 in office however  patient reported BG was over 200 just before she left home. Checked urinalysis today to r/o ketoacidosis.  Urine was negative for ketones.  However was positive for bacteria  Urinalysis    Component Value Date/Time   COLORURINE YELLOW 05/31/2014 0804   APPEARANCEUR Cloudy* 03/21/2016 [place]07/27/2015 0804   LABSPEC >1.030* 05/31/2014 0804   PHURINE 5.0 05/31/2014 0804   GLUCOSEU Negative 03/21/2016 1056   HGBUR NEGATIVE 05/31/2014 0804   BILIRUBINUR Negative 03/21/2016 1056   BILIRUBINUR SMALL* 05/31/2014 0804   BILIRUBINUR neg 09/03/2013 [place]07/27/2015 0804   PROTEINUR Negative 03/21/2016 1056   PROTEINUR NEGATIVE 05/31/2014 0804   PROTEINUR neg 09/03/2013 0923   UROBILINOGEN 0.2 05/31/2014 0804   UROBILINOGEN negative 09/03/2013 0923   NITRITE Negative 03/21/2016 1056   NITRITE NEGATIVE 05/31/2014 0804   NITRITE pos 09/03/2013 0923   LEUKOCYTESUR Negative 03/21/2016 1056   LEUKOCYTESUR NEGATIVE 05/31/2014 0804    Lipid Panel     Component Value Date/Time   CHOL 270* 12/09/2014 0841   CHOL 264* 12/25/2013 0855   CHOL 243* 05/29/2013 1037   TRIG 262* 12/09/2014 0841   TRIG 171* 12/25/2013 0855   TRIG 304* 05/29/2013 1037   HDL 32* 12/09/2014 0841   HDL 38* 12/25/2013 0855   HDL 30* 05/29/2013 1037   CHOLHDL 8.4* 12/09/2014 0841   LDLCALC 186* 12/09/2014 0841   LDLCALC 192* 12/25/2013 09357  LDLCALC 152* 05/29/2013 1037   LDLDIRECT 206* 12/09/2014 0841     Lab Review GLUCOSE (mg/dL)  Date Value  12/09/2014 158*  07/21/2014 175*  12/25/2013 92   GLUCOSE, BLD (mg/dL)  Date Value  05/31/2014 186*  09/06/2013 104*  05/29/2013 82   CO2  Date Value  12/09/2014 24 mmol/L  07/21/2014 24 mmol/L  05/31/2014 24 mEq/L   BUN (mg/dL)  Date Value  12/09/2014 15  07/21/2014 15  05/31/2014 15  12/25/2013 21  09/06/2013 20  05/29/2013 12   CREAT (mg/dL)  Date Value  05/29/2013 0.84  02/27/2013 0.89   CREATININE,  SER (mg/dL)  Date Value  12/09/2014 0.76  07/21/2014 0.81  05/31/2014 0.83   Assessment:    Diabetes Mellitus type II, under inadequate control.   Hyperlipidemia - in need of statin HTN - controlled  Plan:    1.  Rx changes:  Restart Januvia 125m - take 1 tablet daily (gave #30 samples  Continue Lantus and Humalog  Start crestor 160monce daily  Remain off metformin since experienced side effects 2.  Education: Reviewed 'ABCs' of diabetes management (respective goals in parentheses):  A1C (<7), blood pressure (<130/80), and cholesterol (LDL <100). 3.  Reviewed CHO counting diet and serving size recommendations.  Patient is instructed to limit meal to 45 to 50 grams CHO and snacks to 15 to 20 grams of CHO.   4.  Patient is to check her BG with her glucometer and her husbands glucometer over the next 1-2 days to confirm that her glucometer is working properly  Orders Placed This Encounter  Procedures  . Microscopic Examination  . Urine culture  . Bayer DCA Hb A1c Waived  . CMP14+EGFR  . Lipid panel  . Urinalysis, Complete  . Glucose Hemocue Waived  . Microalbumin / creatinine urine ratio    Follow up: 2 weeks

## 2016-03-21 NOTE — Telephone Encounter (Signed)
Patient states difference between her glucometer and her husbands was about 100 points.  Patient is given new rx for glucometer.

## 2016-03-22 LAB — CMP14+EGFR
A/G RATIO: 1.7 (ref 1.2–2.2)
ALBUMIN: 4.3 g/dL (ref 3.5–4.8)
ALT: 94 IU/L — ABNORMAL HIGH (ref 0–32)
AST: 99 IU/L — ABNORMAL HIGH (ref 0–40)
Alkaline Phosphatase: 90 IU/L (ref 39–117)
BUN / CREAT RATIO: 14 (ref 12–28)
BUN: 11 mg/dL (ref 8–27)
Bilirubin Total: 0.6 mg/dL (ref 0.0–1.2)
CALCIUM: 9.3 mg/dL (ref 8.7–10.3)
CO2: 24 mmol/L (ref 18–29)
CREATININE: 0.79 mg/dL (ref 0.57–1.00)
Chloride: 99 mmol/L (ref 96–106)
GFR, EST AFRICAN AMERICAN: 88 mL/min/{1.73_m2} (ref >59)
GFR, EST NON AFRICAN AMERICAN: 76 mL/min/{1.73_m2} (ref >59)
GLOBULIN, TOTAL: 2.6 g/dL (ref 1.5–4.5)
Glucose: 85 mg/dL (ref 65–99)
Potassium: 3.7 mmol/L (ref 3.5–5.2)
SODIUM: 141 mmol/L (ref 134–144)
Total Protein: 6.9 g/dL (ref 6.0–8.5)

## 2016-03-22 LAB — MICROALBUMIN / CREATININE URINE RATIO
CREATININE, UR: 158.9 mg/dL
MICROALB/CREAT RATIO: 4 mg/g creat (ref 0.0–30.0)
MICROALBUM., U, RANDOM: 6.3 ug/mL

## 2016-03-22 LAB — LIPID PANEL
CHOL/HDL RATIO: 7.9 ratio — AB (ref 0.0–4.4)
Cholesterol, Total: 270 mg/dL — ABNORMAL HIGH (ref 100–199)
HDL: 34 mg/dL — ABNORMAL LOW (ref >39)
LDL CALC: 197 mg/dL — AB (ref 0–99)
Triglycerides: 194 mg/dL — ABNORMAL HIGH (ref 0–149)
VLDL Cholesterol Cal: 39 mg/dL (ref 5–40)

## 2016-03-22 LAB — SPECIMEN STATUS REPORT

## 2016-03-23 ENCOUNTER — Other Ambulatory Visit: Payer: Self-pay | Admitting: Pharmacist

## 2016-03-23 MED ORDER — CIPROFLOXACIN HCL 500 MG PO TABS: 500.0000 mg | ORAL_TABLET | Freq: Two times a day (BID) | ORAL | Status: DC

## 2016-03-23 MED ORDER — FLUCONAZOLE 150 MG PO TABS: 150.0000 mg | ORAL_TABLET | Freq: Once | ORAL | Status: DC

## 2016-03-24 LAB — SPECIMEN STATUS REPORT

## 2016-03-24 LAB — VITAMIN D 25 HYDROXY (VIT D DEFICIENCY, FRACTURES): VIT D 25 HYDROXY: 26 ng/mL — AB (ref 30.0–100.0)

## 2016-03-24 LAB — VITAMIN B12: Vitamin B-12: 815 pg/mL (ref 211–946)

## 2016-03-25 LAB — URINE CULTURE

## 2016-03-25 LAB — SPECIMEN STATUS REPORT

## 2016-04-06 ENCOUNTER — Encounter: Payer: Self-pay | Admitting: Pharmacist

## 2016-04-06 ENCOUNTER — Ambulatory Visit (INDEPENDENT_AMBULATORY_CARE_PROVIDER_SITE_OTHER): Payer: Medicare Other | Admitting: Pharmacist

## 2016-04-06 VITALS — BP 120/74 | HR 80 | Ht 64.0 in | Wt 208.0 lb

## 2016-04-06 DIAGNOSIS — E785 Hyperlipidemia, unspecified: Secondary | ICD-10-CM | POA: Diagnosis not present

## 2016-04-06 DIAGNOSIS — Z794 Long term (current) use of insulin: Secondary | ICD-10-CM

## 2016-04-06 DIAGNOSIS — E1140 Type 2 diabetes mellitus with diabetic neuropathy, unspecified: Secondary | ICD-10-CM | POA: Diagnosis not present

## 2016-04-06 DIAGNOSIS — E669 Obesity, unspecified: Secondary | ICD-10-CM

## 2016-04-06 MED ORDER — PEN NEEDLES 31G X 6 MM MISC: Status: DC

## 2016-04-06 MED ORDER — SITAGLIPTIN PHOSPHATE 100 MG PO TABS: 100.0000 mg | ORAL_TABLET | Freq: Every day | ORAL | Status: DC

## 2016-04-06 MED ORDER — LORAZEPAM 1 MG PO TABS: ORAL_TABLET | ORAL | Status: DC

## 2016-04-06 NOTE — Progress Notes (Signed)
Subjective:    Diana Pratt is a 70 y.o. female who presents for an evaluation of Type 2 diabetes mellitus.   She was last seen about 2 weeks ago when Januvia was added to treatment regimen.  She is also currently taking Lantus 50 units daily, Humalog 6 units tid.    The patient was initially diagnosed with Type 2 diabetes mellitus in the 1990's  Known diabetic complications: peripheral neuropathy Cardiovascular risk factors: advanced age (older than 50 for men, 74 for women), diabetes mellitus, dyslipidemia, hypertension, obesity (BMI >= 30 kg/m2) and sedentary lifestyle  Weight trend: stable Prior visit with CDE - yes, last visit 12/2014 Current diet: diet has improved greatly over the last 2 weeks - patient is counting CHO's and has reduced her CHO intake. Current exercise: none  Current monitoring regimen: home blood tests - 2  times daily  HBG reading:  7 day avg = 145 (n=9)  14 day avg = 131 (n=17)   Home blood sugar records: range from 76 to 230 Any episodes of hypoglycemia? no  Is She on ACE inhibitor or angiotensin II receptor blocker?  Yes    valsartan + amlodipine  The following portions of the patient's history were reviewed and updated as appropriate: allergies, current medications, past family history, past medical history, past social history, past surgical history and problem list.    Objective:    BP 120/74 mmHg  Pulse 80  Ht 5\' 4"  (1.626 m)  Wt 208 lb (94.348 kg)  BMI 35.69 kg/m2  A1c = 9.7% (05/17/217)   Lipid Panel     Component Value Date/Time   CHOL 270* 03/21/2016 1055   CHOL 264* 12/25/2013 0855   CHOL 243* 05/29/2013 1037   TRIG 194* 03/21/2016 1055   TRIG 171* 12/25/2013 0855   TRIG 304* 05/29/2013 1037   HDL 34* 03/21/2016 1055   HDL 38* 12/25/2013 0855   HDL 30* 05/29/2013 1037   CHOLHDL 7.9* 03/21/2016 1055   LDLCALC 197* 03/21/2016 1055   LDLCALC 192* 12/25/2013 0855   LDLCALC 152* 05/29/2013 1037   LDLDIRECT 206* 12/09/2014  0841     Lab Review GLUCOSE (mg/dL)  Date Value  03/21/2016 85  12/09/2014 158*  07/21/2014 175*   GLUCOSE, BLD (mg/dL)  Date Value  05/31/2014 186*  09/06/2013 104*  05/29/2013 82   CO2 (mmol/L)  Date Value  03/21/2016 24  12/09/2014 24  07/21/2014 24   BUN (mg/dL)  Date Value  03/21/2016 11  12/09/2014 15  07/21/2014 15  05/31/2014 15  09/06/2013 20  05/29/2013 12   CREAT (mg/dL)  Date Value  05/29/2013 0.84  02/27/2013 0.89   CREATININE, SER (mg/dL)  Date Value  03/21/2016 0.79  12/09/2014 0.76  07/21/2014 0.81   Assessment:    Diabetes Mellitus type II, under improving control.   Hyperlipidemia - patient is tolerating statin well HTN - controlled Obesity -  Weight is stable.  Plan:    1.  Rx changes:  Continue Januvia 100mg s,  Lantus and Humlog  Continue crestor 10mg  once daily 2.  Education: Reviewed 'ABCs' of diabetes management (respective goals in parentheses):  A1C (<7), blood pressure (<130/80), and cholesterol (LDL <100). 3.  Reviewed CHO counting diet and serving size recommendations.  Patient is instructed to limit meal to 45 to 50 grams CHO and snacks to 15 to 20 grams of CHO.   4.  Continue to check BG 1 to 2 times daily  Follow up: 4 weeks with PCP  and 8 weeks with me for AWV

## 2016-04-27 ENCOUNTER — Ambulatory Visit: Payer: Medicare Other | Admitting: Family Medicine

## 2016-05-04 ENCOUNTER — Encounter: Payer: Self-pay | Admitting: Family Medicine

## 2016-05-04 ENCOUNTER — Ambulatory Visit (INDEPENDENT_AMBULATORY_CARE_PROVIDER_SITE_OTHER): Payer: Medicare Other | Admitting: Family Medicine

## 2016-05-04 VITALS — BP 127/62 | HR 89 | Temp 97.0°F | Ht 64.0 in | Wt 210.8 lb

## 2016-05-04 DIAGNOSIS — M25511 Pain in right shoulder: Secondary | ICD-10-CM | POA: Diagnosis not present

## 2016-05-04 DIAGNOSIS — S00402A Unspecified superficial injury of left ear, initial encounter: Secondary | ICD-10-CM | POA: Diagnosis not present

## 2016-05-04 DIAGNOSIS — T148XXA Other injury of unspecified body region, initial encounter: Secondary | ICD-10-CM

## 2016-05-04 NOTE — Progress Notes (Signed)
   HPI  Patient presents today here with right shoulder pain and nonhealing sore of the left ear. Left ear sore Patient explains that about 6 months ago she burned her ear with an iron. He states that since that time she has struggled to get this wound to heal. She's used mupirocin with no improvement Currently he has a large scab on it, is nontender, and bleeds occasionally.  Shoulder pain Right sided anterior shoulder pain, she states that this is at the site of previous shoulder fracture. She did not need surgery for the fracture She has pain with forward flexion of the arm He has had no additional injuries other than the broken arm over a year ago.  PMH: Smoking status noted ROS: Per HPI  Objective: BP 127/62 mmHg  Pulse 89  Temp(Src) 97 F (36.1 C) (Oral)  Ht 5\' 4"  (1.626 m)  Wt 210 lb 12.8 oz (95.618 kg)  BMI 36.17 kg/m2 Gen: NAD, alert, cooperative with exam HEENT: NCAT CV: RRR, good S1/S2, no murmur Resp: CTABL, no wheezes, non-labored Ext: No edema, warm Neuro: Alert and oriented, No gross deficits   Left ear with approximately 1 cm heme crusted lesion, not bleeding or draining today, slightly tender to palpation  Musculoskeletal Limited range of motion of right shoulder to 80 of forward flexion, otherwise full Positive Hawkins sign Negative empty can, no tenderness to palpation of any bony landmarks, describes pain be on the anterior edge of the proximal humerus  Assessment and plan:  # Right shoulder pain Residual pain from previous fracture, also has some signs of rotator cuff syndrome Patient states that she's struggled with this for several months would like a referral. Refer to orthopedics  # Nonhealing wound Patient with a wound from 6 months ago that has had difficulty healing. It bleeds occasionally. I have recommended referral to dermatology for their opinion.    Orders Placed This Encounter  Procedures  . Ambulatory referral to Orthopedic  Surgery    Referral Priority:  Routine    Referral Type:  Surgical    Referral Reason:  Specialty Services Required    Requested Specialty:  Orthopedic Surgery    Number of Visits Requested:  1  . Ambulatory referral to Dermatology    Referral Priority:  Routine    Referral Type:  Consultation    Referral Reason:  Specialty Services Required    Requested Specialty:  Dermatology    Number of Visits Requested:  [place], MD [facility] 05/04/2016, 12:28 PM

## 2016-05-04 NOTE — Patient Instructions (Signed)
Great to meet you!  You will hear from [place] about you referrals, I would continue to keep your ear wound moist (vaseline once daily is a good choice)

## 2016-05-24 DIAGNOSIS — C44219 Basal cell carcinoma of skin of left ear and external auricular canal: Secondary | ICD-10-CM | POA: Diagnosis not present

## 2016-05-24 DIAGNOSIS — L820 Inflamed seborrheic keratosis: Secondary | ICD-10-CM | POA: Diagnosis not present

## 2016-05-28 DIAGNOSIS — S42294P Other nondisplaced fracture of upper end of right humerus, subsequent encounter for fracture with malunion: Secondary | ICD-10-CM | POA: Diagnosis not present

## 2016-05-31 ENCOUNTER — Ambulatory Visit (INDEPENDENT_AMBULATORY_CARE_PROVIDER_SITE_OTHER): Payer: Medicare Other | Admitting: Pharmacist

## 2016-05-31 ENCOUNTER — Other Ambulatory Visit: Payer: Self-pay

## 2016-05-31 ENCOUNTER — Encounter: Payer: Self-pay | Admitting: Pharmacist

## 2016-05-31 VITALS — BP 100/68 | HR 68 | Ht 64.0 in | Wt 210.5 lb

## 2016-05-31 DIAGNOSIS — M8580 Other specified disorders of bone density and structure, unspecified site: Secondary | ICD-10-CM

## 2016-05-31 DIAGNOSIS — Z0000 Encounter for general adult medical examination without abnormal findings: Secondary | ICD-10-CM

## 2016-05-31 NOTE — Patient Instructions (Addendum)
Ms. Diana Pratt , Thank you for taking time to come for your Medicare Wellness Visit. I appreciate your ongoing commitment to your health goals. Please review the following plan we discussed and let me know if I can assist you in the future.   These are the goals we discussed: Recommend mammogram - will get after shoulder surgery and recovery Recommend diabetic eye exam - [facility] [phone] or Dr Marin Comment at [facility] Sending referral for Bone Density (to assess bone strength, risk of fractures and screening for osteoporosis)  Increase non-starchy vegetables - carrots, green bean, squash, zucchini, tomatoes, onions, peppers, spinach and other green leafy vegetables, cabbage, lettuce, cucumbers, asparagus, okra (not fried), eggplant Limit sugar and processed foods (cakes, cookies, ice cream, crackers and chips) Increase fresh fruit but limit serving sizes 1/2 cup or about the size of tennis or baseball Limit red meat to no more than 1-2 times per week (serving size about the size of your palm) Choose whole grains / lean proteins - whole wheat bread, quinoa, whole grain rice (1/2 cup), fish, chicken, [place] Avoid sugar and calorie containing beverages - soda, sweet tea and juice.  Choose water or unsweetened tea instead.    This is a list of the screening recommended for you and due dates:  Health Maintenance  Topic Date Due  . Shingles Vaccine  Due now - cost is $95  . DEXA scan (bone density measurement)  Due now  . Pneumonia vaccines (1 of 2 - PCV13) Due now - cost is $0  . Tetanus Vaccine  Due now - cost is $45  . Mammogram  Due now but will post pone until after surgery  . Eye exam for diabetics  Due now - see above for numbers  . Flu Shot  06/05/2016  . Hemoglobin A1C  09/21/2016  . Complete foot exam   01/19/2017  . Colon Cancer Screening  07/19/2024  .  Hepatitis C: One time screening is recommended by Center for Disease Control  (CDC) for  adults born from [birth year] through 1965.    Completed   Health Maintenance, Female Adopting a healthy lifestyle and getting preventive care can go a long way to promote health and wellness. Talk with your health care provider about what schedule of regular examinations is right for you. This is a good chance for you to check in with your provider about disease prevention and staying healthy. In between checkups, there are plenty of things you can do on your own. Experts have done a lot of research about which lifestyle changes and preventive measures are most likely to keep you healthy. Ask your health care provider for more information. WEIGHT AND DIET  Eat a healthy diet  Be sure to include plenty of vegetables, fruits, low-fat dairy products, and lean protein.  Do not eat a lot of foods high in solid fats, added sugars, or salt.  Get regular exercise. This is one of the most important things you can do for your health.  Most adults should exercise for at least 150 minutes each week. The exercise should increase your heart rate and make you sweat (moderate-intensity exercise).  Most adults should also do strengthening exercises at least twice a week. This is in addition to the moderate-intensity exercise.  Maintain a healthy weight  Body mass index (BMI) is a measurement that can be used to identify possible weight problems. It estimates body fat based on height and weight. Your health care provider can help determine  your BMI and help you achieve or maintain a healthy weight.  For females 15 years of age and older:   A BMI below 18.5 is considered underweight.  A BMI of 18.5 to 24.9 is normal.  A BMI of 25 to 29.9 is considered overweight.  A BMI of 30 and above is considered obese.  Watch levels of cholesterol and blood lipids  You should start having your blood tested for lipids and cholesterol at 70 years of age, then have this test every 5 years.  You may need to have your cholesterol levels checked more often  if:  Your lipid or cholesterol levels are high.  You are older than 70 years of age.  You are at high risk for heart disease.  CANCER SCREENING   Lung Cancer  Lung cancer screening is recommended for adults 25-33 years old who are at high risk for lung cancer because of a history of smoking.  A yearly low-dose CT scan of the lungs is recommended for people who:  Currently smoke.  Have quit within the past 15 years.  Have at least a 30-pack-year history of smoking. A pack year is smoking an average of one pack of cigarettes a day for 1 year.  Yearly screening should continue until it has been 15 years since you quit.  Yearly screening should stop if you develop a health problem that would prevent you from having lung cancer treatment.  Breast Cancer  Practice breast self-awareness. This means understanding how your breasts normally appear and feel.  It also means doing regular breast self-exams. Let your health care provider know about any changes, no matter how small.  If you are in your 20s or 30s, you should have a clinical breast exam (CBE) by a health care provider every 1-3 years as part of a regular health exam.  If you are 51 or older, have a CBE every year. Also consider having a breast X-ray (mammogram) every year.  If you have a family history of breast cancer, talk to your health care provider about genetic screening.  If you are at high risk for breast cancer, talk to your health care provider about having an MRI and a mammogram every year.  Breast cancer gene (BRCA) assessment is recommended for women who have family members with BRCA-related cancers. BRCA-related cancers include:  Breast.  Ovarian.  Tubal.  Peritoneal cancers.  Results of the assessment will determine the need for genetic counseling and BRCA1 and BRCA2 testing. Cervical Cancer Your health care provider may recommend that you be screened regularly for cancer of the pelvic organs  (ovaries, uterus, and vagina). This screening involves a pelvic examination, including checking for microscopic changes to the surface of your cervix (Pap test). You may be encouraged to have this screening done every 3 years, beginning at age 60.  For women ages 44-65, health care providers may recommend pelvic exams and Pap testing every 3 years, or they may recommend the Pap and pelvic exam, combined with testing for human papilloma virus (HPV), every 5 years. Some types of HPV increase your risk of cervical cancer. Testing for HPV may also be done on women of any age with unclear Pap test results.  Other health care providers may not recommend any screening for nonpregnant women who are considered low risk for pelvic cancer and who do not have symptoms. Ask your health care provider if a screening pelvic exam is right for you.  If you have had past treatment  for cervical cancer or a condition that could lead to cancer, you need Pap tests and screening for cancer for at least 20 years after your treatment. If Pap tests have been discontinued, your risk factors (such as having a new sexual partner) need to be reassessed to determine if screening should resume. Some women have medical problems that increase the chance of getting cervical cancer. In these cases, your health care provider may recommend more frequent screening and Pap tests. Colorectal Cancer  This type of cancer can be detected and often prevented.  Routine colorectal cancer screening usually begins at 70 years of age and continues through 70 years of age.  Your health care provider may recommend screening at an earlier age if you have risk factors for colon cancer.  Your health care provider may also recommend using home test kits to check for hidden blood in the stool.  A small camera at the end of a tube can be used to examine your colon directly (sigmoidoscopy or colonoscopy). This is done to check for the earliest forms of  colorectal cancer.  Routine screening usually begins at age 76.  Direct examination of the colon should be repeated every 5-10 years through 70 years of age. However, you may need to be screened more often if early forms of precancerous polyps or small growths are found. Skin Cancer  Check your skin from head to toe regularly.  Tell your health care provider about any new moles or changes in moles, especially if there is a change in a mole's shape or color.  Also tell your health care provider if you have a mole that is larger than the size of a pencil eraser.  Always use sunscreen. Apply sunscreen liberally and repeatedly throughout the day.  Protect yourself by wearing long sleeves, pants, a wide-brimmed hat, and sunglasses whenever you are outside. HEART DISEASE, DIABETES, AND HIGH BLOOD PRESSURE   High blood pressure causes heart disease and increases the risk of stroke. High blood pressure is more likely to develop in:  People who have blood pressure in the high end of the normal range (130-139/85-89 mm Hg).  People who are overweight or obese.  People who are African American.  If you are 50-32 years of age, have your blood pressure checked every 3-5 years. If you are 68 years of age or older, have your blood pressure checked every year. You should have your blood pressure measured twice--once when you are at a hospital or clinic, and once when you are not at a hospital or clinic. Record the average of the two measurements. To check your blood pressure when you are not at a hospital or clinic, you can use:  An automated blood pressure machine at a pharmacy.  A home blood pressure monitor.  If you are between 85 years and 86 years old, ask your health care provider if you should take aspirin to prevent strokes.  Have regular diabetes screenings. This involves taking a blood sample to check your fasting blood sugar level.  If you are at a normal weight and have a low risk for  diabetes, have this test once every three years after 70 years of age.  If you are overweight and have a high risk for diabetes, consider being tested at a younger age or more often. PREVENTING INFECTION  Hepatitis B  If you have a higher risk for hepatitis B, you should be screened for this virus. You are considered at high risk for hepatitis  B if:  You were born in a country where hepatitis B is common. Ask your health care provider which countries are considered high risk.  Your parents were born in a high-risk country, and you have not been immunized against hepatitis B (hepatitis B vaccine).  You have HIV or AIDS.  You use needles to inject street drugs.  You live with someone who has hepatitis B.  You have had sex with someone who has hepatitis B.  You get hemodialysis treatment.  You take certain medicines for conditions, including cancer, organ transplantation, and autoimmune conditions. Hepatitis C  Blood testing is recommended for:  Everyone born from [birth year] through 1965.  Anyone with known risk factors for hepatitis C. Sexually transmitted infections (STIs)  You should be screened for sexually transmitted infections (STIs) including gonorrhea and chlamydia if:  You are sexually active and are younger than 70 years of age.  You are older than 70 years of age and your health care provider tells you that you are at risk for this type of infection.  Your sexual activity has changed since you were last screened and you are at an increased risk for chlamydia or gonorrhea. Ask your health care provider if you are at risk.  If you do not have HIV, but are at risk, it may be recommended that you take a prescription medicine daily to prevent HIV infection. This is called pre-exposure prophylaxis (PrEP). You are considered at risk if:  You are sexually active and do not regularly use condoms or know the HIV status of your partner(s).  You take drugs by injection.  You are  sexually active with a partner who has HIV. Talk with your health care provider about whether you are at high risk of being infected with HIV. If you choose to begin PrEP, you should first be tested for HIV. You should then be tested every 3 months for as long as you are taking PrEP.  PREGNANCY   If you are premenopausal and you may become pregnant, ask your health care provider about preconception counseling.  If you may become pregnant, take 400 to 800 micrograms (mcg) of folic acid every day.  If you want to prevent pregnancy, talk to your health care provider about birth control (contraception). OSTEOPOROSIS AND MENOPAUSE   Osteoporosis is a disease in which the bones lose minerals and strength with aging. This can result in serious bone fractures. Your risk for osteoporosis can be identified using a bone density scan.  If you are 44 years of age or older, or if you are at risk for osteoporosis and fractures, ask your health care provider if you should be screened.  Ask your health care provider whether you should take a calcium or vitamin D supplement to lower your risk for osteoporosis.  Menopause may have certain physical symptoms and risks.  Hormone replacement therapy may reduce some of these symptoms and risks. Talk to your health care provider about whether hormone replacement therapy is right for you.  HOME CARE INSTRUCTIONS   Schedule regular health, dental, and eye exams.  Stay current with your immunizations.   Do not use any tobacco products including cigarettes, chewing tobacco, or electronic cigarettes.  If you are pregnant, do not drink alcohol.  If you are breastfeeding, limit how much and how often you drink alcohol.  Limit alcohol intake to no more than 1 drink per day for nonpregnant women. One drink equals 12 ounces of beer, 5 ounces of wine, or  1 ounces of hard liquor.  Do not use street drugs.  Do not share needles.  Ask your health care provider for  help if you need support or information about quitting drugs.  Tell your health care provider if you often feel depressed.  Tell your health care provider if you have ever been abused or do not feel safe at home.   This information is not intended to replace advice given to you by your health care provider. Make sure you discuss any questions you have with your health care provider.   Document Released: 05/07/2011 Document Revised: 11/12/2014 Document Reviewed: 09/23/2013 Elsevier Interactive Patient Education [employer].

## 2016-05-31 NOTE — Progress Notes (Signed)
Subjective:   Diana Pratt is a 70 y.o. female who presents for an Initial Medicare Annual Wellness Visit and to recheck type 2 DM.  Reports that BG at home has been 151 to 202 recently.   Diana Pratt reports that she is having surgery on her right shoulder 06/14/2016.  She also have been recently diagnosed with skin cancer on her left ear and will have surgery in October 2017.    Review of Systems  Review of Systems  Constitutional: Negative.   HENT: Negative.   Eyes: Negative.   Respiratory: Positive for shortness of breath (when hot outside / copd).   Cardiovascular: Negative.   Genitourinary: Negative.   Musculoskeletal: Positive for joint pain (right shoulder - sugery scheduled for 06/14/2016).  Skin: Positive for itching (left ear - around area of skin cancer - surgery is planned october 2017).  Neurological: Negative.   Endo/Heme/Allergies: Negative.   Psychiatric/Behavioral: The patient has insomnia (related to right shoulder pain).    Current Medications (verified) Outpatient Encounter Prescriptions as of 05/31/2016  Medication Sig  . ACCU-CHEK AVIVA PLUS test strip   . ACCU-CHEK SOFTCLIX LANCETS lancets   . acetaminophen (TYLENOL) 325 MG tablet Take 650 mg by mouth every 6 (six) hours as needed. pain  . albuterol (PROAIR HFA) 108 (90 BASE) MCG/ACT inhaler Inhale 2 puffs into the lungs every 6 (six) hours as needed for shortness of breath.  Marland Kitchen albuterol (PROVENTIL) (2.5 MG/3ML) 0.083% nebulizer solution Take 2.5 mg by nebulization every 6 (six) hours as needed. Shortness of breath   . amLODipine-valsartan (EXFORGE) 10-320 MG tablet Take 1 tablet by mouth daily.  Marland Kitchen aspirin 81 MG EC tablet Take 81 mg by mouth daily.    . budesonide-formoterol (SYMBICORT) 160-4.5 MCG/ACT inhaler Inhale 2 puffs into the lungs 2 (two) times daily.  . calcium carbonate 200 MG capsule Take 250 mg by mouth daily.   . Cholecalciferol (VITAMIN D3) 2000 UNITS capsule Take 2,000 Units by mouth  daily.   Marland Kitchen escitalopram (LEXAPRO) 10 MG tablet Take 2 tablets (20 mg total) by mouth daily.  . fluticasone (FLONASE) 50 MCG/ACT nasal spray Place 2 sprays into the nose daily as needed for allergies. Congestion   . hydrochlorothiazide (HYDRODIURIL) 25 MG tablet Take 1 tablet (25 mg total) by mouth daily.  . hydroxypropyl methylcellulose (ISOPTO TEARS) 2.5 % ophthalmic solution Place 1 drop into both eyes daily as needed. for dry eyes   . Insulin Glargine (LANTUS SOLOSTAR) 100 UNIT/ML Solostar Pen Inject 50 Units into the skin daily at 10 pm.  . insulin lispro (HUMALOG) 100 UNIT/ML KiwkPen Inject 0.06-0.08 mLs (6-8 Units total) into the skin 3 (three) times daily with meals.  . Insulin Pen Needle (PEN NEEDLES) 31G X 6 MM MISC Use to administer insulin once qid. Dx E11.59 Use Leader brand  . LORazepam (ATIVAN) 1 MG tablet TAKE 1 TABLET TWICE DAILY AS NEEDED FOR ANXIETY  . Multiple Vitamins-Minerals (CENTRUM SILVER PO) Take 1 tablet by mouth daily.   . sitaGLIPtin (JANUVIA) 100 MG tablet Take 1 tablet (100 mg total) by mouth daily.  . promethazine (PHENERGAN) 25 MG tablet Take 1 tablet (25 mg total) by mouth every 6 (six) hours as needed for nausea or vomiting. (Patient not taking: Reported on 05/31/2016)  . [DISCONTINUED] rosuvastatin (CRESTOR) 10 MG tablet Take 1 tablet (10 mg total) by mouth daily. (Patient not taking: Reported on 05/31/2016)   No facility-administered encounter medications on file as of 05/31/2016.  Allergies (verified) Lipitor [atorvastatin]; Morphine and related; Penicillins; Simvastatin; Sulfa antibiotics; Zetia [ezetimibe]; and Livalo [pitavastatin]   History: Past Medical History:  Diagnosis Date  . Anxiety   . Asthma   . Back pain   . Cancer ([place]) 2017   skin cancer on left ear  . COPD (chronic obstructive pulmonary disease) ([place])   . Depression   . Diabetes mellitus   . Diabetic neuropathy ([place])   . Humerus fracture    right  . Hyperlipemia   .  Hypertension   . Interstitial cystitis   . Osteoporosis    Past Surgical History:  Procedure Laterality Date  . ABDOMINAL HYSTERECTOMY  03/1984   partial  . COLONOSCOPY N/A 07/19/2014   Procedure: COLONOSCOPY;  Surgeon: Danie Binder, MD;  Location: [facility];  Service: Endoscopy;  Laterality: N/A;  9:30   Family History  Problem Relation Age of Onset  . Parkinsonism Mother   . Dementia Mother   . Colon cancer Father 63    MULTIPLE CO-MORBIDITIES  . Cancer Father   . Diabetes Paternal Aunt   . Diabetes Paternal Uncle   . Diabetes Paternal Grandmother   . Colon polyps Neg Hx    Social History   Occupational History  . Not on file.   Social History Main Topics  . Smoking status: Former Smoker    Packs/day: 0.50    Years: 34.00    Quit date: 06/01/1994  . Smokeless tobacco: Never Used  . Alcohol use Yes     Comment: once a month  . Drug use: No  . Sexual activity: No    Do you feel safe at home?  Yes Are there smokers in your home (other than you)? No  Dietary issues and exercise activities: Current Exercise Habits: The patient does not participate in regular exercise at present, Exercise limited by: orthopedic condition(s)  Current Dietary habits:  Breakfast - cereal - whole gain, soy milk and blueberries Lunch and Supper - pinto beans, turnip greens, cantelope  Beverages - water and unsweetened tea with Splenda  Objective:    Today's Vitals   05/31/16 1034  BP: 100/68  Pulse: 68  Weight: 210 lb 8 oz (95.5 kg)  Height: 5\' 4"  (1.626 m)  PainSc: 8   PainLoc: Shoulder   Body mass index is 36.13 kg/m.  Activities of Daily Living In your present state of health, do you have any difficulty performing the following activities: 05/31/2016  Hearing? N  Vision? N  Difficulty concentrating or making decisions? N  Walking or climbing stairs? Y  Dressing or bathing? Y  Doing errands, shopping? N  Preparing Food and eating ? N  Using the Toilet? N  In the  past six months, have you accidently leaked urine? N  Do you have problems with loss of bowel control? N  Managing your Medications? N  Managing your Finances? N  Housekeeping or managing your Housekeeping? N  Some recent data might be hidden     Cardiac Risk Factors include: diabetes mellitus;dyslipidemia;advanced age (>57men, >23 women);hypertension;obesity (BMI >30kg/m2);sedentary lifestyle  Depression Screen PHQ 2/9 Scores 05/31/2016 05/04/2016 04/06/2016 01/20/2016  PHQ - 2 Score 2 2 1 1   PHQ- 9 Score 4 9 - -     Fall Risk Fall Risk  05/31/2016 05/04/2016 04/06/2016 01/20/2016 05/20/2015  Falls in the past year? No Yes Yes Yes Yes  Number falls in past yr: - 1 1 1 2  or more  Injury with Fall? - No No  No Yes  Risk for fall due to : - - - - History of fall(s)  Follow up - - Falls prevention discussed - Falls prevention discussed   Timed up and do Test = less than 12 seconds to walk 10 feet and no abnormalities noted  Cognitive Function: MMSE - Mini Mental State Exam 05/31/2016  Orientation to time 5  Orientation to Place 5  Registration 3  Attention/ Calculation 5  Recall 3  Language- name 2 objects 2  Language- repeat 1  Language- follow 3 step command 3  Language- read & follow direction 1  Write a sentence 1  Copy design 1  Total score 30    Immunizations and Health Maintenance Immunization History  Administered Date(s) Administered  . DTaP 01/04/2003   Health Maintenance Due  Topic Date Due  . ZOSTAVAX  03/05/2006  . DEXA SCAN  03/06/2011  . PNA vac Low Risk Adult (1 of 2 - PCV13) 03/06/2011  . TETANUS/TDAP  01/03/2013  . OPHTHALMOLOGY EXAM  02/25/2015    Patient Care Team: Wardell Honour, MD as PCP - General (Family Medicine) Danie Binder, MD as Consulting Physician (Gastroenterology) Druscilla Brownie, MD as Consulting Physician (Dermatology) Justice Britain, MD as Consulting Physician (Orthopedic Surgery)  Indicate any recent Medical Services you may have  received from other than [facility] providers in the past year (date may be approximate).    Assessment:    Annual Wellness Visit  Type 2 diabetes    Screening Tests Health Maintenance  Topic Date Due  . ZOSTAVAX  03/05/2006  . DEXA SCAN  03/06/2011  . PNA vac Low Risk Adult (1 of 2 - PCV13) 03/06/2011  . TETANUS/TDAP  01/03/2013  . OPHTHALMOLOGY EXAM  02/25/2015  . MAMMOGRAM  07/06/2016 (Originally 06/11/2014)  . INFLUENZA VACCINE  06/05/2016  . HEMOGLOBIN A1C  09/21/2016  . FOOT EXAM  01/19/2017  . COLONOSCOPY  07/19/2024  . Hepatitis C Screening  Completed        Plan:   During the course of the visit Royalty was educated and counseled about the following appropriate screening and preventive services:   Vaccines to include Pneumoccal, Influenza, Td, Zostavax - patient refused pneumo vaccines, shingles and Tdap vaccines  Colorectal cancer screening - UTD  Cardiovascular disease screening -   Last lipid panel showed elevated LDL - patient tried crestor 10mg  qd for 1 month but stopped last week due to myalgias. I recommended she retry at lower dose - 10mg  MWF.  Patient agrees to try again.  Diabetes - FBG still slightly elevated - Looking into coverage of Soliqua (starting dose would be 30 units QD)  Bone Denisty / Osteoporosis Screening - referral sent  Mammogram - postponed as patient is unable to raise her right arm for mammogram - will schedule after shoulder surgery and recovery  Glaucoma screening / Diabetic Eye Exam - due now - patient given numbers for 2 different optometrists.  Nutrition counseling - continue to limit sugar and CHO intake.   Advanced Directives - Information provided  Physical Activity - increase as able.  Hopefully after surgery will be able to use elliptical at home and increase activity.  Reviewed inhaler technique    Patient Instructions (the written plan) were given to the patient.   Cherre Robins, PharmD   05/31/2016

## 2016-06-06 ENCOUNTER — Encounter (HOSPITAL_COMMUNITY)
Admission: RE | Admit: 2016-06-06 | Discharge: 2016-06-06 | Disposition: A | Discharge status: Home / Self Care | Payer: Medicare Other | Source: Ambulatory Visit | Attending: Orthopedic Surgery | Admitting: Orthopedic Surgery

## 2016-06-06 ENCOUNTER — Encounter (HOSPITAL_COMMUNITY): Payer: Self-pay

## 2016-06-06 DIAGNOSIS — Z01818 Encounter for other preprocedural examination: Secondary | ICD-10-CM | POA: Diagnosis not present

## 2016-06-06 DIAGNOSIS — I10 Essential (primary) hypertension: Secondary | ICD-10-CM | POA: Diagnosis not present

## 2016-06-06 DIAGNOSIS — E119 Type 2 diabetes mellitus without complications: Secondary | ICD-10-CM | POA: Diagnosis not present

## 2016-06-06 DIAGNOSIS — J449 Chronic obstructive pulmonary disease, unspecified: Secondary | ICD-10-CM | POA: Diagnosis not present

## 2016-06-06 DIAGNOSIS — Z01812 Encounter for preprocedural laboratory examination: Secondary | ICD-10-CM | POA: Insufficient documentation

## 2016-06-06 DIAGNOSIS — Z0183 Encounter for blood typing: Secondary | ICD-10-CM | POA: Insufficient documentation

## 2016-06-06 HISTORY — DX: Gastro-esophageal reflux disease without esophagitis: K21.9

## 2016-06-06 HISTORY — DX: Pneumonia, unspecified organism: J18.9

## 2016-06-06 HISTORY — DX: Other specified postprocedural states: R11.2

## 2016-06-06 HISTORY — DX: Other specified postprocedural states: Z98.890

## 2016-06-06 HISTORY — DX: Reserved for inherently not codable concepts without codable children: IMO0001

## 2016-06-06 LAB — TYPE AND SCREEN
ABO/RH(D): O POS
ANTIBODY SCREEN: NEGATIVE

## 2016-06-06 LAB — BASIC METABOLIC PANEL
Anion gap: 10 (ref 5–15)
BUN: 11 mg/dL (ref 6–20)
CALCIUM: 9.5 mg/dL (ref 8.9–10.3)
CO2: 23 mmol/L (ref 22–32)
CREATININE: 0.75 mg/dL (ref 0.44–1.00)
Chloride: 105 mmol/L (ref 101–111)
Glucose, Bld: 151 mg/dL — ABNORMAL HIGH (ref 65–99)
Potassium: 3.8 mmol/L (ref 3.5–5.1)
SODIUM: 138 mmol/L (ref 135–145)

## 2016-06-06 LAB — ABO/RH: ABO/RH(D): O POS

## 2016-06-06 LAB — CBC
HEMATOCRIT: 41.5 % (ref 36.0–46.0)
Hemoglobin: 13.8 g/dL (ref 12.0–15.0)
MCH: 30.7 pg (ref 26.0–34.0)
MCHC: 33.3 g/dL (ref 30.0–36.0)
MCV: 92.4 fL (ref 78.0–100.0)
PLATELETS: 227 10*3/uL (ref 150–400)
RBC: 4.49 MIL/uL (ref 3.87–5.11)
RDW: 12.7 % (ref 11.5–15.5)
WBC: 7.2 10*3/uL (ref 4.0–10.5)

## 2016-06-06 LAB — SURGICAL PCR SCREEN
MRSA, PCR: NEGATIVE
STAPHYLOCOCCUS AUREUS: NEGATIVE

## 2016-06-06 LAB — GLUCOSE, CAPILLARY: GLUCOSE-CAPILLARY: 182 mg/dL — AB (ref 65–99)

## 2016-06-06 NOTE — Pre-Procedure Instructions (Signed)
Diana Pratt  06/06/2016      [place], [place] [address] Phone: [phone] Fax: [phone]  [place], [address] Karie Fetch [address] [place] [address] Phone: [phone] Fax: [phone]    Your procedure is scheduled on  Thursday  06/14/16  Report to [facility] at 800 A.M.  Call this number if you have problems the morning of surgery:  [phone]   Remember:  Do not eat food or drink liquids after midnight.  Take these medicines the morning of surgery with A SIP OF WATER  ALBUTEROL, SYMBICORT, ESCITALOPRAM (LEXAPRO), LORAZEPAM IF NEEDED     How to Manage Your Diabetes Before and After Surgery  Why is it important to control my blood sugar before and after surgery? . Improving blood sugar levels before and after surgery helps healing and can limit problems. . A way of improving blood sugar control is eating a healthy diet by: o  Eating less sugar and carbohydrates o  Increasing activity/exercise o  Talking with your doctor about reaching your blood sugar goals . High blood sugars (greater than 180 mg/dL) can raise your risk of infections and slow your recovery, so you will need to focus on controlling your diabetes during the weeks before surgery. . Make sure that the doctor who takes care of your diabetes knows about your planned surgery including the date and location.  How do I manage my blood sugar before surgery? . Check your blood sugar at least 4 times a day, starting 2 days before surgery, to make sure that the level is not too high or low. o Check your blood sugar the morning of your surgery when you wake up and every 2 hours until you get to the Short Stay unit. . If your blood sugar is less than 70 mg/dL, you will need to treat for low blood sugar: o Do not take insulin. o Treat a low blood sugar  (less than 70 mg/dL) with  cup of clear juice (cranberry or apple), 4 glucose tablets, OR glucose gel. o Recheck blood sugar in 15 minutes after treatment (to make sure it is greater than 70 mg/dL). If your blood sugar is not greater than 70 mg/dL on recheck, call [phone] for further instructions. . Report your blood sugar to the short stay nurse when you get to Short Stay.  . If you are admitted to the hospital after surgery: o Your blood sugar will be checked by the staff and you will probably be given insulin after surgery (instead of oral diabetes medicines) to make sure you have good blood sugar levels. o The goal for blood sugar control after surgery is 80-180 mg/dL.              WHAT DO I DO ABOUT MY DIABETES MEDICATION?   Marland Kitchen Do not take oral diabetes medicines (pills) the morning of surgery.  . THE NIGHT BEFORE SURGERY, take _______25 ____ units of __LANTUS_________insulin.       Marland Kitchen HE MORNING OF SURGERY, take _________0____ units of ___NONE_______insulin.  . The day of surgery, do not take other diabetes injectables, including Byetta (exenatide), Bydureon (exenatide ER), Victoza (liraglutide), or Trulicity (dulaglutide).  . If your CBG is greater than 220 mg/dL, you may take  of your sliding scale (correction) dose of insulin.  Other Instructions:  Patient Signature:  Date:   Nurse Signature:  Date:   Reviewed and Endorsed by [facility] Patient Education Committee, August 2015  Do not wear jewelry, make-up or nail polish.  Do not wear lotions, powders, or perfumes.  You may wear deoderant.  Do not shave 48 hours prior to surgery.  Men may shave face and neck.  Do not bring valuables to the hospital.  [facility] is not responsible for any belongings or valuables.  Contacts, dentures or bridgework may not be worn into surgery.  Leave your suitcase in the car.  After surgery it may be brought to your room.  For patients admitted to the  hospital, discharge time will be determined by your treatment team.  Patients discharged the day of surgery will not be allowed to drive home.   Name and phone number of your driver:    Special instructions:  [facility] - Preparing for Surgery  Before surgery, you can play an important role.  Because skin is not sterile, your skin needs to be as free of germs as possible.  You can reduce the number of germs on you skin by washing with CHG (chlorahexidine gluconate) soap before surgery.  CHG is an antiseptic cleaner which kills germs and bonds with the skin to continue killing germs even after washing.  Please DO NOT use if you have an allergy to CHG or antibacterial soaps.  If your skin becomes reddened/irritated stop using the CHG and inform your nurse when you arrive at Short Stay.  Do not shave (including legs and underarms) for at least 48 hours prior to the first CHG shower.  You may shave your face.  Please follow these instructions carefully:   1.  Shower with CHG Soap the night before surgery and the                                morning of Surgery.  2.  If you choose to wash your hair, wash your hair first as usual with your       normal shampoo.  3.  After you shampoo, rinse your hair and body thoroughly to remove the                      Shampoo.  4.  Use CHG as you would any other liquid soap.  You can apply chg directly       to the skin and wash gently with scrungie or a clean washcloth.  5.  Apply the CHG Soap to your body ONLY FROM THE NECK DOWN.        Do not use on open wounds or open sores.  Avoid contact with your eyes,       ears, mouth and genitals (private parts).  Wash genitals (private parts)       with your normal soap.  6.  Wash thoroughly, paying special attention to the area where your surgery        will be performed.  7.  Thoroughly rinse your body with warm water from the neck down.  8.  DO NOT shower/wash with your normal soap after using and rinsing off        the CHG Soap.  9.  Pat yourself dry with a clean towel.            10.  Wear clean pajamas.  11.  Place clean sheets on your bed the night of your first shower and do not        sleep with pets.  Day of Surgery  Do not apply any lotions/deoderants the morning of surgery.  Please wear clean clothes to the hospital/surgery center.    Please read over the following fact sheets that you were given. Pain Booklet, MRSA Information and Surgical Site Infection Prevention

## 2016-06-06 NOTE — Progress Notes (Signed)
PATIENT STATED SHE DOES NOT HAVE A CARDIOLOGIST OR PULMONOLOGIST.  SHE SEES DR. Kenn File IN [facility]

## 2016-06-07 LAB — HEMOGLOBIN A1C
HEMOGLOBIN A1C: 7.9 % — AB (ref 4.8–5.6)
MEAN PLASMA GLUCOSE: 180 mg/dL

## 2016-06-13 MED ORDER — SODIUM CHLORIDE 0.9 % IV SOLN
1000.0000 mg | INTRAVENOUS | Status: AC
  Administered 2016-06-14: 1000 mg via INTRAVENOUS
  Filled 2016-06-13: qty 10

## 2016-06-13 MED ORDER — CEFAZOLIN SODIUM-DEXTROSE 2-4 GM/100ML-% IV SOLN
2.0000 g | INTRAVENOUS | Status: AC
  Administered 2016-06-14: 2 g via INTRAVENOUS
  Filled 2016-06-13: qty 100

## 2016-06-14 ENCOUNTER — Inpatient Hospital Stay (HOSPITAL_COMMUNITY)
Admission: RE | Admit: 2016-06-14 | Discharge: 2016-06-15 | DRG: 483 | Disposition: A | Discharge status: Home / Self Care | Payer: Medicare Other | Source: Ambulatory Visit | Attending: Orthopedic Surgery | Admitting: Orthopedic Surgery

## 2016-06-14 ENCOUNTER — Encounter (HOSPITAL_COMMUNITY)
Admission: RE | Disposition: A | Discharge status: Home / Self Care | Payer: Self-pay | Source: Ambulatory Visit | Attending: Orthopedic Surgery

## 2016-06-14 ENCOUNTER — Inpatient Hospital Stay (HOSPITAL_COMMUNITY): Payer: Medicare Other | Admitting: Certified Registered Nurse Anesthetist

## 2016-06-14 ENCOUNTER — Encounter (HOSPITAL_COMMUNITY): Payer: Self-pay | Admitting: *Deleted

## 2016-06-14 ENCOUNTER — Inpatient Hospital Stay (HOSPITAL_COMMUNITY): Payer: Medicare Other | Admitting: Emergency Medicine

## 2016-06-14 DIAGNOSIS — F419 Anxiety disorder, unspecified: Secondary | ICD-10-CM | POA: Diagnosis present

## 2016-06-14 DIAGNOSIS — Z6835 Body mass index (BMI) 35.0-35.9, adult: Secondary | ICD-10-CM

## 2016-06-14 DIAGNOSIS — W19XXXD Unspecified fall, subsequent encounter: Secondary | ICD-10-CM | POA: Diagnosis present

## 2016-06-14 DIAGNOSIS — J449 Chronic obstructive pulmonary disease, unspecified: Secondary | ICD-10-CM | POA: Diagnosis not present

## 2016-06-14 DIAGNOSIS — Z79899 Other long term (current) drug therapy: Secondary | ICD-10-CM

## 2016-06-14 DIAGNOSIS — Z794 Long term (current) use of insulin: Secondary | ICD-10-CM | POA: Diagnosis not present

## 2016-06-14 DIAGNOSIS — Z7982 Long term (current) use of aspirin: Secondary | ICD-10-CM | POA: Diagnosis not present

## 2016-06-14 DIAGNOSIS — M810 Age-related osteoporosis without current pathological fracture: Secondary | ICD-10-CM | POA: Diagnosis present

## 2016-06-14 DIAGNOSIS — K219 Gastro-esophageal reflux disease without esophagitis: Secondary | ICD-10-CM | POA: Diagnosis present

## 2016-06-14 DIAGNOSIS — Z87891 Personal history of nicotine dependence: Secondary | ICD-10-CM

## 2016-06-14 DIAGNOSIS — E1140 Type 2 diabetes mellitus with diabetic neuropathy, unspecified: Secondary | ICD-10-CM | POA: Diagnosis not present

## 2016-06-14 DIAGNOSIS — E785 Hyperlipidemia, unspecified: Secondary | ICD-10-CM | POA: Diagnosis not present

## 2016-06-14 DIAGNOSIS — Z85828 Personal history of other malignant neoplasm of skin: Secondary | ICD-10-CM | POA: Diagnosis not present

## 2016-06-14 DIAGNOSIS — F329 Major depressive disorder, single episode, unspecified: Secondary | ICD-10-CM | POA: Diagnosis present

## 2016-06-14 DIAGNOSIS — Z96611 Presence of right artificial shoulder joint: Secondary | ICD-10-CM

## 2016-06-14 DIAGNOSIS — I10 Essential (primary) hypertension: Secondary | ICD-10-CM | POA: Diagnosis not present

## 2016-06-14 DIAGNOSIS — S42201P Unspecified fracture of upper end of right humerus, subsequent encounter for fracture with malunion: Secondary | ICD-10-CM | POA: Diagnosis not present

## 2016-06-14 DIAGNOSIS — M25511 Pain in right shoulder: Secondary | ICD-10-CM | POA: Diagnosis present

## 2016-06-14 DIAGNOSIS — S42291K Other displaced fracture of upper end of right humerus, subsequent encounter for fracture with nonunion: Secondary | ICD-10-CM | POA: Diagnosis not present

## 2016-06-14 DIAGNOSIS — Z96619 Presence of unspecified artificial shoulder joint: Secondary | ICD-10-CM

## 2016-06-14 DIAGNOSIS — G8918 Other acute postprocedural pain: Secondary | ICD-10-CM | POA: Diagnosis not present

## 2016-06-14 DIAGNOSIS — S42201K Unspecified fracture of upper end of right humerus, subsequent encounter for fracture with nonunion: Secondary | ICD-10-CM | POA: Diagnosis not present

## 2016-06-14 HISTORY — PX: REVERSE SHOULDER ARTHROPLASTY: SHX5054

## 2016-06-14 LAB — GLUCOSE, CAPILLARY
GLUCOSE-CAPILLARY: 195 mg/dL — AB (ref 65–99)
GLUCOSE-CAPILLARY: 232 mg/dL — AB (ref 65–99)
GLUCOSE-CAPILLARY: 250 mg/dL — AB (ref 65–99)
Glucose-Capillary: 179 mg/dL — ABNORMAL HIGH (ref 65–99)
Glucose-Capillary: 307 mg/dL — ABNORMAL HIGH (ref 65–99)

## 2016-06-14 SURGERY — ARTHROPLASTY, SHOULDER, TOTAL, REVERSE
Anesthesia: General | Site: Shoulder | Laterality: Right

## 2016-06-14 MED ORDER — SUGAMMADEX SODIUM 200 MG/2ML IV SOLN
INTRAVENOUS | Status: DC | PRN
  Administered 2016-06-14: 200 mg via INTRAVENOUS

## 2016-06-14 MED ORDER — PROPOFOL 10 MG/ML IV BOLUS
INTRAVENOUS | Status: AC
  Filled 2016-06-14: qty 20

## 2016-06-14 MED ORDER — ZOLPIDEM TARTRATE 5 MG PO TABS
5.0000 mg | ORAL_TABLET | Freq: Once | ORAL | Status: AC
  Administered 2016-06-14: 5 mg via ORAL
  Filled 2016-06-14: qty 1

## 2016-06-14 MED ORDER — ASPIRIN EC 81 MG PO TBEC
81.0000 mg | DELAYED_RELEASE_TABLET | Freq: Every day | ORAL | Status: DC
  Administered 2016-06-14 – 2016-06-15 (×2): 81 mg via ORAL
  Filled 2016-06-14 (×2): qty 1

## 2016-06-14 MED ORDER — ONDANSETRON HCL 4 MG/2ML IJ SOLN
INTRAMUSCULAR | Status: DC | PRN
  Administered 2016-06-14 (×2): 4 mg via INTRAVENOUS

## 2016-06-14 MED ORDER — ROCURONIUM BROMIDE 10 MG/ML (PF) SYRINGE
PREFILLED_SYRINGE | INTRAVENOUS | Status: AC
  Filled 2016-06-14: qty 10

## 2016-06-14 MED ORDER — MOMETASONE FURO-FORMOTEROL FUM 200-5 MCG/ACT IN AERO
2.0000 | INHALATION_SPRAY | Freq: Two times a day (BID) | RESPIRATORY_TRACT | Status: DC
  Administered 2016-06-14 – 2016-06-15 (×2): 2 via RESPIRATORY_TRACT
  Filled 2016-06-14: qty 8.8

## 2016-06-14 MED ORDER — ALBUTEROL SULFATE (2.5 MG/3ML) 0.083% IN NEBU: 2.5000 mg | INHALATION_SOLUTION | Freq: Four times a day (QID) | RESPIRATORY_TRACT | Status: DC | PRN

## 2016-06-14 MED ORDER — SCOPOLAMINE 1 MG/3DAYS TD PT72
MEDICATED_PATCH | TRANSDERMAL | Status: AC
  Filled 2016-06-14: qty 1

## 2016-06-14 MED ORDER — DIAZEPAM 5 MG PO TABS: 5.0000 mg | ORAL_TABLET | Freq: Four times a day (QID) | ORAL | Status: DC | PRN

## 2016-06-14 MED ORDER — HYDROMORPHONE HCL 1 MG/ML IJ SOLN
1.0000 mg | INTRAMUSCULAR | Status: DC | PRN
Start: 2016-06-14 — End: 2016-06-15

## 2016-06-14 MED ORDER — PROPOFOL 10 MG/ML IV BOLUS
INTRAVENOUS | Status: DC | PRN
  Administered 2016-06-14: 150 mg via INTRAVENOUS

## 2016-06-14 MED ORDER — OXYCODONE HCL 5 MG PO TABS
5.0000 mg | ORAL_TABLET | ORAL | Status: DC | PRN
  Administered 2016-06-14 – 2016-06-15 (×5): 10 mg via ORAL
  Filled 2016-06-14 (×5): qty 2

## 2016-06-14 MED ORDER — FENTANYL CITRATE (PF) 250 MCG/5ML IJ SOLN
INTRAMUSCULAR | Status: AC
Start: 2016-06-14 — End: 2016-06-14
  Filled 2016-06-14: qty 5

## 2016-06-14 MED ORDER — ALBUTEROL SULFATE (2.5 MG/3ML) 0.083% IN NEBU
2.5000 mg | INHALATION_SOLUTION | Freq: Four times a day (QID) | RESPIRATORY_TRACT | Status: DC | PRN
  Administered 2016-06-15: 2.5 mg via RESPIRATORY_TRACT
  Filled 2016-06-14: qty 3

## 2016-06-14 MED ORDER — KETOROLAC TROMETHAMINE 15 MG/ML IJ SOLN
7.5000 mg | Freq: Four times a day (QID) | INTRAMUSCULAR | Status: AC
  Administered 2016-06-14 – 2016-06-15 (×4): 7.5 mg via INTRAVENOUS
  Filled 2016-06-14 (×3): qty 1

## 2016-06-14 MED ORDER — DEXAMETHASONE SODIUM PHOSPHATE 10 MG/ML IJ SOLN
INTRAMUSCULAR | Status: DC | PRN
  Administered 2016-06-14: 10 mg via INTRAVENOUS

## 2016-06-14 MED ORDER — POLYETHYLENE GLYCOL 3350 17 G PO PACK: 17.0000 g | PACK | Freq: Every day | ORAL | Status: DC | PRN

## 2016-06-14 MED ORDER — MAGNESIUM CITRATE PO SOLN: 1.0000 | Freq: Once | ORAL | Status: DC | PRN

## 2016-06-14 MED ORDER — IRBESARTAN 300 MG PO TABS
300.0000 mg | ORAL_TABLET | Freq: Every day | ORAL | Status: DC
  Administered 2016-06-15: 300 mg via ORAL
  Filled 2016-06-14: qty 1

## 2016-06-14 MED ORDER — MIDAZOLAM HCL 2 MG/2ML IJ SOLN
1.0000 mg | Freq: Once | INTRAMUSCULAR | Status: AC
  Administered 2016-06-14: 1 mg via INTRAVENOUS

## 2016-06-14 MED ORDER — HYDROCHLOROTHIAZIDE 25 MG PO TABS
25.0000 mg | ORAL_TABLET | Freq: Every day | ORAL | Status: DC
Start: 2016-06-14 — End: 2016-06-15
  Administered 2016-06-15: 25 mg via ORAL
  Filled 2016-06-14: qty 1

## 2016-06-14 MED ORDER — DEXAMETHASONE SODIUM PHOSPHATE 10 MG/ML IJ SOLN
INTRAMUSCULAR | Status: AC
  Filled 2016-06-14: qty 1

## 2016-06-14 MED ORDER — CHLORHEXIDINE GLUCONATE 4 % EX LIQD: 60.0000 mL | Freq: Once | CUTANEOUS | Status: DC

## 2016-06-14 MED ORDER — FENTANYL CITRATE (PF) 100 MCG/2ML IJ SOLN
INTRAMUSCULAR | Status: AC
  Administered 2016-06-14: 25 ug via INTRAVENOUS
  Filled 2016-06-14: qty 2

## 2016-06-14 MED ORDER — SUCCINYLCHOLINE CHLORIDE 20 MG/ML IJ SOLN
INTRAMUSCULAR | Status: DC | PRN
Start: 2016-06-14 — End: 2016-06-14
  Administered 2016-06-14: 160 mg via INTRAVENOUS

## 2016-06-14 MED ORDER — FENTANYL CITRATE (PF) 100 MCG/2ML IJ SOLN
25.0000 ug | INTRAMUSCULAR | Status: DC | PRN
  Administered 2016-06-14: 25 ug via INTRAVENOUS

## 2016-06-14 MED ORDER — ACETAMINOPHEN 325 MG PO TABS
650.0000 mg | ORAL_TABLET | Freq: Four times a day (QID) | ORAL | Status: DC | PRN
Start: 2016-06-14 — End: 2016-06-15
  Administered 2016-06-15: 650 mg via ORAL
  Filled 2016-06-14: qty 2

## 2016-06-14 MED ORDER — PHENOL 1.4 % MT LIQD: 1.0000 | OROMUCOSAL | Status: DC | PRN

## 2016-06-14 MED ORDER — BISACODYL 5 MG PO TBEC: 5.0000 mg | DELAYED_RELEASE_TABLET | Freq: Every day | ORAL | Status: DC | PRN

## 2016-06-14 MED ORDER — FENTANYL CITRATE (PF) 100 MCG/2ML IJ SOLN
50.0000 ug | Freq: Once | INTRAMUSCULAR | Status: AC
  Administered 2016-06-14: 50 ug via INTRAVENOUS
  Filled 2016-06-14: qty 1

## 2016-06-14 MED ORDER — SUCCINYLCHOLINE CHLORIDE 200 MG/10ML IV SOSY
PREFILLED_SYRINGE | INTRAVENOUS | Status: AC
  Filled 2016-06-14: qty 10

## 2016-06-14 MED ORDER — ROCURONIUM BROMIDE 100 MG/10ML IV SOLN
INTRAVENOUS | Status: DC | PRN
  Administered 2016-06-14: 30 mg via INTRAVENOUS

## 2016-06-14 MED ORDER — ESCITALOPRAM OXALATE 10 MG PO TABS
20.0000 mg | ORAL_TABLET | Freq: Every day | ORAL | Status: DC
  Administered 2016-06-14 – 2016-06-15 (×2): 20 mg via ORAL
  Filled 2016-06-14 (×2): qty 2

## 2016-06-14 MED ORDER — ONDANSETRON HCL 4 MG/2ML IJ SOLN
INTRAMUSCULAR | Status: AC
  Filled 2016-06-14: qty 2

## 2016-06-14 MED ORDER — METOCLOPRAMIDE HCL 5 MG/ML IJ SOLN: 5.0000 mg | Freq: Three times a day (TID) | INTRAMUSCULAR | Status: DC | PRN

## 2016-06-14 MED ORDER — CEFAZOLIN SODIUM-DEXTROSE 2-4 GM/100ML-% IV SOLN
2.0000 g | Freq: Four times a day (QID) | INTRAVENOUS | Status: AC
  Administered 2016-06-14 – 2016-06-15 (×3): 2 g via INTRAVENOUS
  Filled 2016-06-14 (×3): qty 100

## 2016-06-14 MED ORDER — SCOPOLAMINE 1 MG/3DAYS TD PT72
MEDICATED_PATCH | TRANSDERMAL | Status: DC | PRN
  Administered 2016-06-14: 1 via TRANSDERMAL

## 2016-06-14 MED ORDER — PHENYLEPHRINE HCL 10 MG/ML IJ SOLN
INTRAVENOUS | Status: DC | PRN
  Administered 2016-06-14: 50 ug/min via INTRAVENOUS
  Administered 2016-06-14: 30 ug/min via INTRAVENOUS

## 2016-06-14 MED ORDER — ONDANSETRON HCL 4 MG PO TABS: 4.0000 mg | ORAL_TABLET | Freq: Four times a day (QID) | ORAL | Status: DC | PRN

## 2016-06-14 MED ORDER — ALUM & MAG HYDROXIDE-SIMETH 200-200-20 MG/5ML PO SUSP: 30.0000 mL | ORAL | Status: DC | PRN

## 2016-06-14 MED ORDER — FENTANYL CITRATE (PF) 100 MCG/2ML IJ SOLN
INTRAMUSCULAR | Status: AC
  Filled 2016-06-14: qty 2

## 2016-06-14 MED ORDER — ARTIFICIAL TEARS OP OINT
TOPICAL_OINTMENT | OPHTHALMIC | Status: AC
  Filled 2016-06-14: qty 3.5

## 2016-06-14 MED ORDER — 0.9 % SODIUM CHLORIDE (POUR BTL) OPTIME
TOPICAL | Status: DC | PRN
  Administered 2016-06-14: 1000 mL

## 2016-06-14 MED ORDER — AMLODIPINE BESYLATE 10 MG PO TABS
10.0000 mg | ORAL_TABLET | Freq: Every day | ORAL | Status: DC
  Administered 2016-06-15: 10 mg via ORAL
  Filled 2016-06-14: qty 1

## 2016-06-14 MED ORDER — LACTATED RINGERS IV SOLN
INTRAVENOUS | Status: DC
  Administered 2016-06-14: 21:00:00 via INTRAVENOUS

## 2016-06-14 MED ORDER — DOCUSATE SODIUM 100 MG PO CAPS
100.0000 mg | ORAL_CAPSULE | Freq: Two times a day (BID) | ORAL | Status: DC
  Administered 2016-06-14 – 2016-06-15 (×3): 100 mg via ORAL
  Filled 2016-06-14 (×3): qty 1

## 2016-06-14 MED ORDER — LACTATED RINGERS IV SOLN
INTRAVENOUS | Status: DC
  Administered 2016-06-14 (×2): via INTRAVENOUS

## 2016-06-14 MED ORDER — GLYCOPYRROLATE 0.2 MG/ML IJ SOLN
INTRAMUSCULAR | Status: DC | PRN
  Administered 2016-06-14: 0.2 mg via INTRAVENOUS

## 2016-06-14 MED ORDER — METOCLOPRAMIDE HCL 5 MG PO TABS: 5.0000 mg | ORAL_TABLET | Freq: Three times a day (TID) | ORAL | Status: DC | PRN

## 2016-06-14 MED ORDER — ONDANSETRON HCL 4 MG/2ML IJ SOLN
4.0000 mg | Freq: Four times a day (QID) | INTRAMUSCULAR | Status: DC | PRN
  Administered 2016-06-15: 4 mg via INTRAVENOUS
  Filled 2016-06-14: qty 2

## 2016-06-14 MED ORDER — MENTHOL 3 MG MT LOZG
1.0000 | LOZENGE | OROMUCOSAL | Status: DC | PRN
  Administered 2016-06-14: 3 mg via ORAL
  Filled 2016-06-14: qty 9

## 2016-06-14 MED ORDER — AMLODIPINE BESYLATE-VALSARTAN 10-320 MG PO TABS: 1.0000 | ORAL_TABLET | Freq: Every day | ORAL | Status: DC

## 2016-06-14 MED ORDER — INSULIN ASPART 100 UNIT/ML ~~LOC~~ SOLN
0.0000 [IU] | Freq: Every day | SUBCUTANEOUS | Status: DC
  Administered 2016-06-14: 4 [IU] via SUBCUTANEOUS

## 2016-06-14 MED ORDER — MIDAZOLAM HCL 2 MG/2ML IJ SOLN
INTRAMUSCULAR | Status: AC
  Filled 2016-06-14: qty 2

## 2016-06-14 MED ORDER — ACETAMINOPHEN 650 MG RE SUPP: 650.0000 mg | Freq: Four times a day (QID) | RECTAL | Status: DC | PRN

## 2016-06-14 MED ORDER — INSULIN ASPART 100 UNIT/ML ~~LOC~~ SOLN
0.0000 [IU] | Freq: Three times a day (TID) | SUBCUTANEOUS | Status: DC
  Administered 2016-06-14 – 2016-06-15 (×3): 5 [IU] via SUBCUTANEOUS

## 2016-06-14 SURGICAL SUPPLY — 67 items
BASEPLATE GLENOID SHLDR SM (Shoulder) ×2 IMPLANT
BLADE SAW SGTL 83.5X18.5 (BLADE) ×2 IMPLANT
CEMENT HV SMART SET (Cement) ×2 IMPLANT
CEMENT RESTRICTOR DEPUY SZ 3 (Cement) ×2 IMPLANT
COVER SURGICAL LIGHT HANDLE (MISCELLANEOUS) ×2 IMPLANT
CUP SUT UNIV REVERS 36+2 RT (Cup) ×2 IMPLANT
DERMABOND ADVANCED (GAUZE/BANDAGES/DRESSINGS) ×1
DERMABOND ADVANCED .7 DNX12 (GAUZE/BANDAGES/DRESSINGS) ×1 IMPLANT
DRAPE ORTHO SPLIT 77X108 STRL (DRAPES) ×2
DRAPE SURG 17X11 SM STRL (DRAPES) ×2 IMPLANT
DRAPE SURG ORHT 6 SPLT 77X108 (DRAPES) ×2 IMPLANT
DRAPE U-SHAPE 47X51 STRL (DRAPES) ×2 IMPLANT
DRSG AQUACEL AG ADV 3.5X10 (GAUZE/BANDAGES/DRESSINGS) ×2 IMPLANT
DURAPREP 26ML APPLICATOR (WOUND CARE) ×2 IMPLANT
ELECT BLADE 4.0 EZ CLEAN MEGAD (MISCELLANEOUS) ×2
ELECT CAUTERY BLADE 6.4 (BLADE) ×2 IMPLANT
ELECT REM PT RETURN 9FT ADLT (ELECTROSURGICAL) ×2
ELECTRODE BLDE 4.0 EZ CLN MEGD (MISCELLANEOUS) ×1 IMPLANT
ELECTRODE REM PT RTRN 9FT ADLT (ELECTROSURGICAL) ×1 IMPLANT
FACESHIELD WRAPAROUND (MASK) ×6 IMPLANT
GLENOSPHERE LATERAL 36MM+4 (Shoulder) ×2 IMPLANT
GLOVE BIO SURGEON STRL SZ7.5 (GLOVE) ×2 IMPLANT
GLOVE BIO SURGEON STRL SZ8 (GLOVE) ×2 IMPLANT
GLOVE EUDERMIC 7 POWDERFREE (GLOVE) ×2 IMPLANT
GLOVE SS BIOGEL STRL SZ 7.5 (GLOVE) ×1 IMPLANT
GLOVE SUPERSENSE BIOGEL SZ 7.5 (GLOVE) ×1
GOWN STRL REUS W/ TWL LRG LVL3 (GOWN DISPOSABLE) IMPLANT
GOWN STRL REUS W/ TWL XL LVL3 (GOWN DISPOSABLE) ×2 IMPLANT
GOWN STRL REUS W/TWL LRG LVL3 (GOWN DISPOSABLE)
GOWN STRL REUS W/TWL XL LVL3 (GOWN DISPOSABLE) ×2
KIT BASIN OR (CUSTOM PROCEDURE TRAY) ×2 IMPLANT
KIT ROOM TURNOVER OR (KITS) ×2 IMPLANT
LINER HUMERAL 36 +3MM SM (Shoulder) ×2 IMPLANT
MANIFOLD NEPTUNE II (INSTRUMENTS) ×2 IMPLANT
NEEDLE 1/2 CIR CATGUT .05X1.09 (NEEDLE) ×2 IMPLANT
NEEDLE HYPO 25GX1X1/2 BEV (NEEDLE) IMPLANT
NEEDLE MAYO TROCAR (NEEDLE) ×2 IMPLANT
NS IRRIG 1000ML POUR BTL (IV SOLUTION) ×2 IMPLANT
PACK SHOULDER (CUSTOM PROCEDURE TRAY) ×2 IMPLANT
PAD ARMBOARD 7.5X6 YLW CONV (MISCELLANEOUS) ×4 IMPLANT
PASSER SUT SWANSON 36MM LOOP (INSTRUMENTS) ×2 IMPLANT
RESTRAINT HEAD UNIVERSAL NS (MISCELLANEOUS) ×2 IMPLANT
SCREW CENTRAL NONLOCK 6.5X20MM (Shoulder) ×2 IMPLANT
SCREW LOCK PERIPHERAL 30MM (Shoulder) ×2 IMPLANT
SCREW LOCK PERIPHERAL 36MM (Screw) ×2 IMPLANT
SET PIN UNIVERSAL REVERSE (SET/KITS/TRAYS/PACK) ×2 IMPLANT
SLING ARM FOAM STRAP LRG (SOFTGOODS) ×2 IMPLANT
SMARTMIX MINI TOWER (MISCELLANEOUS) ×2
SPONGE LAP 18X18 X RAY DECT (DISPOSABLE) IMPLANT
SPONGE LAP 4X18 X RAY DECT (DISPOSABLE) ×2 IMPLANT
STEM HUMERAL MOD SZ 5 135 DEG (Stem) ×2 IMPLANT
SUCTION FRAZIER HANDLE 10FR (MISCELLANEOUS) ×1
SUCTION TUBE FRAZIER 10FR DISP (MISCELLANEOUS) ×1 IMPLANT
SUT BONE WAX W31G (SUTURE) IMPLANT
SUT FIBERWIRE #2 38 T-5 BLUE (SUTURE) ×6
SUT MNCRL AB 3-0 PS2 18 (SUTURE) ×2 IMPLANT
SUT MON AB 2-0 CT1 36 (SUTURE) ×2 IMPLANT
SUT VIC AB 1 CT1 27 (SUTURE) ×1
SUT VIC AB 1 CT1 27XBRD ANBCTR (SUTURE) ×1 IMPLANT
SUT VIC AB 2-0 CT1 27 (SUTURE)
SUT VIC AB 2-0 CT1 TAPERPNT 27 (SUTURE) IMPLANT
SUTURE FIBERWR #2 38 T-5 BLUE (SUTURE) ×3 IMPLANT
SYR CONTROL 10ML LL (SYRINGE) IMPLANT
TOWEL OR 17X24 6PK STRL BLUE (TOWEL DISPOSABLE) ×2 IMPLANT
TOWEL OR 17X26 10 PK STRL BLUE (TOWEL DISPOSABLE) ×2 IMPLANT
TOWER SMARTMIX MINI (MISCELLANEOUS) ×1 IMPLANT
WATER STERILE IRR 1000ML POUR (IV SOLUTION) ×2 IMPLANT

## 2016-06-14 NOTE — Anesthesia Preprocedure Evaluation (Addendum)
Anesthesia Evaluation  Patient identified by MRN, date of birth, ID band Patient awake    Reviewed: Allergy & Precautions, H&P , Patient's Chart, lab work & pertinent test results, reviewed documented beta blocker date and time   History of Anesthesia Complications (+) PROLONGED EMERGENCE  Airway Mallampati: II  TM Distance: >3 FB Neck ROM: full    Dental no notable dental hx.    Pulmonary former smoker,    Pulmonary exam normal breath sounds clear to auscultation       Cardiovascular hypertension, On Medications  Rhythm:regular Rate:Normal     Neuro/Psych    GI/Hepatic   Endo/Other  diabetes, Type 2Morbid obesity  Renal/GU      Musculoskeletal   Abdominal   Peds  Hematology   Anesthesia Other Findings Diabetes mellitus    Asthma   Back pain    Diabetic neuropathy (HCC)    COPD    GERD        Reproductive/Obstetrics                            Anesthesia Physical Anesthesia Plan  ASA: III  Anesthesia Plan: General   Post-op Pain Management: GA combined w/ Regional for post-op pain   Induction: Intravenous  Airway Management Planned: Oral ETT and Video Laryngoscope Planned  Additional Equipment:   Intra-op Plan:   Post-operative Plan: Extubation in OR  Informed Consent: I have reviewed the patients History and Physical, chart, labs and discussed the procedure including the risks, benefits and alternatives for the proposed anesthesia with the patient or authorized representative who has indicated his/her understanding and acceptance.   Dental Advisory Given and Dental advisory given  Plan Discussed with: CRNA and Surgeon  Anesthesia Plan Comments: (  Discussed general anesthesia, including possible nausea, instrumentation of airway, sore throat,pulmonary aspiration, etc. I asked if the were any outstanding questions, or  concerns before we proceeded. )        Anesthesia Quick Evaluation

## 2016-06-14 NOTE — Anesthesia Procedure Notes (Signed)
Anesthesia Regional Block:  Interscalene brachial plexus block  Pre-Anesthetic Checklist: ,, timeout performed, Correct Patient, Correct Site, Correct Laterality, Correct Position,,,,,,, at surgeon's request and post-op pain management  Laterality: Right  Prep: chloraprep       Needles:   Needle Type: Echogenic Needle     Needle Length: 9cm 9 cm Needle Gauge: 21 and 21 G    Additional Needles:  Procedures: ultrasound guided (picture in chart) and other Interscalene brachial plexus block  Nerve Stimulator or Paresthesia:  Response: arm, 0.33 mA,   Additional Responses:   Narrative:  Injection made incrementally with aspirations every 5 mL. Anesthesiologist: Lyndle Herrlich  Additional Notes: .5% Marcaine 25cc

## 2016-06-14 NOTE — Transfer of Care (Signed)
Immediate Anesthesia Transfer of Care Note  Patient: Diana Pratt  Procedure(s) Performed: Procedure(s): REVERSE SHOULDER ARTHROPLASTY (Right)  Patient Location: PACU  Anesthesia Type:General and Regional  Level of Consciousness: patient cooperative and responds to stimulation  Airway & Oxygen Therapy: Patient Spontanous Breathing and Patient connected to nasal cannula oxygen  Post-op Assessment: Report given to RN and Post -op Vital signs reviewed and stable  Post vital signs: Reviewed and stable  Last Vitals:  Vitals:   06/14/16 1016 06/14/16 1021  BP: (!) 121/46 (!) 115/32  Pulse: 75 76  Resp: 19 18  Temp:      Last Pain:  Vitals:   06/14/16 0827  TempSrc:   PainSc: 8          Complications: No apparent anesthesia complications

## 2016-06-14 NOTE — Discharge Instructions (Signed)

## 2016-06-14 NOTE — Anesthesia Procedure Notes (Signed)
Procedure Name: Intubation Date/Time: 06/14/2016 10:55 AM Performed by: Maryland Pink Pre-anesthesia Checklist: Patient identified, Emergency Drugs available, Suction available, Patient being monitored and Timeout performed Patient Re-evaluated:Patient Re-evaluated prior to inductionOxygen Delivery Method: Circle system utilized Preoxygenation: Pre-oxygenation with 100% oxygen Intubation Type: IV induction and Rapid sequence Laryngoscope Size: Glidescope and 3 Grade View: Grade I Tube type: Oral Tube size: 7.0 mm Number of attempts: 1 Airway Equipment and Method: Video-laryngoscopy and LTA kit utilized Placement Confirmation: ETT inserted through vocal cords under direct vision,  positive ETCO2 and breath sounds checked- equal and bilateral Secured at: 22 cm Tube secured with: Tape Dental Injury: Teeth and Oropharynx as per pre-operative assessment

## 2016-06-14 NOTE — H&P (Signed)
Diana Pratt    Chief Complaint: RIGHT PROXIMAL HUMERAL MALUNION HPI: The patient is a 70 y.o. female s/p right proximal humeral fracture with malunion and post traumatic arthritis  Past Medical History:  Diagnosis Date  . Anxiety   . Asthma   . Back pain   . 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 COPD (chronic obstructive pulmonary disease) ([place])    dr. Kenn File  . Depression   . Diabetes mellitus   . Diabetic neuropathy ([place])   . GERD (gastroesophageal reflux disease)    occ heartburn  . Humerus fracture    right  . Hyperlipemia   . Hypertension   . Interstitial cystitis   . Osteoporosis   . Pneumonia    15 yrs ago  . PONV (postoperative nausea and vomiting)   . Shortness of breath dyspnea     Past Surgical History:  Procedure Laterality Date  . ABDOMINAL HYSTERECTOMY  03/1984   partial  . COLONOSCOPY N/A 07/19/2014   Procedure: COLONOSCOPY;  Surgeon: Danie Binder, MD;  Location: [facility];  Service: Endoscopy;  Laterality: N/A;  9:30    Family History  Problem Relation Age of Onset  . Parkinsonism Mother   . Dementia Mother   . Colon cancer Father 8    MULTIPLE CO-MORBIDITIES  . Cancer Father   . Diabetes Paternal Aunt   . Diabetes Paternal Uncle   . Diabetes Paternal Grandmother   . Colon polyps Neg Hx     Social History:  reports that she quit smoking about 22 years ago. She has a 17.00 pack-year smoking history. She has never used smokeless tobacco. She reports that she drinks alcohol. She reports that she does not use drugs.   Medications Prior to Admission  Medication Sig Dispense Refill  . ACCU-CHEK AVIVA PLUS test strip 1 each by Other route See admin instructions. Check blood sugar 3 times daily    . ACCU-CHEK SOFTCLIX LANCETS lancets 1 each by Other route See admin instructions. Check blood sugar 3 times daily    . acetaminophen (TYLENOL) 325 MG tablet Take 650 mg by mouth every 6 (six) hours as  needed. pain    . albuterol (PROAIR HFA) 108 (90 BASE) MCG/ACT inhaler Inhale 2 puffs into the lungs every 6 (six) hours as needed for shortness of breath. 18 g 1  . albuterol (PROVENTIL) (2.5 MG/3ML) 0.083% nebulizer solution Take 2.5 mg by nebulization every 6 (six) hours as needed. Shortness of breath     . amLODipine-valsartan (EXFORGE) 10-320 MG tablet Take 1 tablet by mouth daily. 90 tablet 0  . aspirin 81 MG EC tablet Take 81 mg by mouth daily.      . budesonide-formoterol (SYMBICORT) 160-4.5 MCG/ACT inhaler Inhale 2 puffs into the lungs 2 (two) times daily. 2 Inhaler 0  . calcium carbonate 200 MG capsule Take 250 mg by mouth daily.     . Cholecalciferol (VITAMIN D3) 2000 UNITS capsule Take 2,000 Units by mouth daily.  60 capsule 11  . escitalopram (LEXAPRO) 10 MG tablet Take 2 tablets (20 mg total) by mouth daily. 180 tablet 1  . fluticasone (FLONASE) 50 MCG/ACT nasal spray Place 2 sprays into the nose daily as needed for allergies. Congestion     . hydrochlorothiazide (HYDRODIURIL) 25 MG tablet Take 1 tablet (25 mg total) by mouth daily. 90 tablet 0  . Insulin Glargine (LANTUS SOLOSTAR) 100 UNIT/ML Solostar Pen Inject 50  Units into the skin daily at 10 pm. 15 mL 2  . insulin lispro (HUMALOG) 100 UNIT/ML KiwkPen Inject 0.06-0.08 mLs (6-8 Units total) into the skin 3 (three) times daily with meals. 15 mL 2  . Insulin Pen Needle (PEN NEEDLES) 31G X 6 MM MISC Use to administer insulin once qid. Dx E11.59 Use Leader brand 100 each 1  . LORazepam (ATIVAN) 1 MG tablet TAKE 1 TABLET TWICE DAILY AS NEEDED FOR ANXIETY (Patient taking differently: Take 1 mg by mouth 2 (two) times daily as needed for anxiety. ) 60 tablet 0  . Multiple Vitamins-Minerals (CENTRUM SILVER PO) Take 1 tablet by mouth daily.     . sitaGLIPtin (JANUVIA) 100 MG tablet Take 1 tablet (100 mg total) by mouth daily. 30 tablet 1  . hydroxypropyl methylcellulose (ISOPTO TEARS) 2.5 % ophthalmic solution Place 1 drop into both eyes  daily as needed. for dry eyes     . promethazine (PHENERGAN) 25 MG tablet Take 1 tablet (25 mg total) by mouth every 6 (six) hours as needed for nausea or vomiting. 15 tablet 0     Physical Exam: right shoulder with painful and restricted motion as noted at recent office visits  Vitals  Temp:  [98.9 F (37.2 C)] 98.9 F (37.2 C) (08/10 0814) Pulse Rate:  [72-75] 73 (08/10 0945) Resp:  [18-23] 18 (08/10 0945) BP: (125-146)/(39-52) 125/44 (08/10 0945) SpO2:  [97 %-98 %] 97 % (08/10 0945) Weight:  [93.9 kg (207 lb)] 93.9 kg (207 lb) (08/10 0814)  Assessment/Plan  Impression: RIGHT PROXIMAL HUMERAL MALUNION  Plan of Action: Procedure(s): REVERSE SHOULDER ARTHROPLASTY  Remus Hagedorn M Tarena Gockley 06/14/2016, 10:02 AM Contact # [phone]

## 2016-06-14 NOTE — Op Note (Signed)
Diana Pratt, Diana Pratt               ACCOUNT NO.:  [id number]  MEDICAL RECORD NO.:  [id number]  LOCATION:  [facility]                         FACILITY:  [facility]  PHYSICIAN:  Metta Clines. Tobby Fawcett, M.D.  DATE OF BIRTH:  January 02, 1946  DATE OF PROCEDURE:  06/14/2016 DATE OF DISCHARGE:                              OPERATIVE REPORT   PREOPERATIVE DIAGNOSIS:  Right proximal humeral malunion.  POSTOPERATIVE DIAGNOSIS:  Right proximal humeral malunion.  PROCEDURE:  Right shoulder reverse arthroplasty utilizing a cemented size 5.5 Arthrex stem, +3 polyethylene insert and a 36 +4 glenosphere on the small baseplate.  SURGEON:  Metta Clines. Reniah Cottingham, M.D.  Terrence DupontOlivia Mackie A. Shuford, P.A.-C.  ANESTHESIA:  General endotracheal as well as an interscalene block.  ESTIMATED BLOOD LOSS:  150 mL.  DRAINS:  None.  HISTORY:  Ms. Bunn is a 70 year old female, who approximately 8 months ago fell, sustaining an impacted and displaced right proximal humerus fracture, which has gone on to a malunion with ongoing significant pain and functional limitation.  Her office examination reveals profound loss of mobility and functional limitations and x-rays confirmed significant malunion.  Due to her ongoing pain and functional limitations, she is brought to the operating room at this time for planned right shoulder reverse arthroplasty.  Preoperatively, I counseled Ms. Legan regarding treatment options and potential risks versus benefits thereof.  Possible surgical complications were reviewed including bleeding, infection, neurovascular injury, persistent pain, loss of motion, anesthetic complication, failure of the implant and possible need for additional surgery.  She understands and accepts and agrees with our planned procedure.  PROCEDURE IN DETAIL:  After undergoing routine preop evaluation, the patient received prophylactic antibiotics.  An interscalene block was established in the holding area by the Anesthesia  Department.  Placed supine on the operating table, underwent smooth induction of a general endotracheal anesthesia.  Placed in the beach-chair position and appropriately padded and protected.  The right shoulder girdle region was then sterilely prepped and draped in standard fashion.  Time-out was called.  An anterior deltopectoral approach to the right shoulder was made through an 8-cm incision.  Skin flaps were elevated and electrocautery was used for hemostasis.  Dissection carried deeply through the cephalic vein, taken laterally with the deltoid.  Pec major retracted medially and the upper centimeter and half of pec major was tenotomized to enhance exposure.  We then mobilized the conjoined tendon and retracted this medially.  We then tenotomized the long head of the biceps tendon.  We then divided the subscapularis away from the lesser tuberosity and found that there was significant contracture to the fact that there had been a severely retroverted and posterior displaced fracture.  We placed some tag sutures in the subscap in case we were able to allow repair at the end.  We then divided the superior aspects of the residual intact rotator cuff to allow exposure.  We then divided the capsular attachments on the anterior-inferior and inferior aspects of the humeral head and neck, found [place] to deliver the humeral head through the wound.  Again, this was severely retroverted and retroflexed.  Using our extramedullary guide, we outlined the proposed humeral head resection, which we resected  using an oscillating saw.  We then placed a metal cap over the cut proximal surface for protection. At this point, we proceeded to expose the glenoid with combination of Fukuda, pitchfork and snake tongue retractors.  We performed a circumferential labral resection, gained complete visualization of the periphery of the glenoid.  A guidepin was placed into the center of the glenoid.  We placed our  central and peripheral reamers and had appropriate bed for the placement of the small baseplate.  The wound was then irrigated.  Baseplate was impacted.  Our central lag screw was placed followed by the peripheral locking screws and all obtained excellent purchase and fixation.  At this point, we made an attempt to place the glenosphere over the soft tissue compartment, did not allow placement at this point.  At this point, we then returned our attention back to the humeral metaphyseal region.  We performed a preparation of the humeral metaphysis and had corrected the version to approximately 10 degrees of retroversion.  We then performed hand reaming, broaching up for the 5.5 stem and then performed the proximal preparation with +2 offset.  At this point, we placed our trial and was able to obtain good fit and fixation.  At this point, then, we removed our trial, we utilized the excavated area on the humeral metaphysis to allow more room to manipulate our glenosphere.  The glenosphere was then placed over the [facility] taper, impacted with good fit and fixation.  We then returned our attention back to the proximal humerus and with the tightness of the soft tissues, we did find that the posterior lip of the humeral metaphysis with some rotator cuff musculature had separated.  This did compromise somewhat the press-fit capabilities and so given this finding, we went ahead and anticipated cementing our stem.  The distal cement plug was then placed.  We mixed cement and introduced cement into the canal using the pressurized syringe in hand packing and then placed our assembled 5.5 stem with a 36 metaphysis down into the humeral shaft. We then carefully removed all residual cement.  We impacted the stem to the appropriate depth.  Once all cement was cleaned and allowed to harden, we then again performed the trial reduction.  At this time, the +3 polyethylene showed excellent soft tissue balance  and good fit.  The trial was removed.  We impacted the final +3 poly into our metaphysis. We irrigated the joint, the final reduction was then performed.  This showed good soft tissue balance, good stability, proper soft tissue tension.  Good stability of the shoulder.  The wound was then irrigated. Hemostasis was obtained.  The deltopectoral interval was then reapproximated with series of figure-of-eight #1 Vicryl sutures.  2-0 Vicryl was used for the subcu layer, intracuticular 3-0 Monocryl for the skin followed by Dermabond and an Aquacel dressing.  Right arm was placed in a sling.  The patient was awakened, extubated and taken to the recovery room in stable condition.  Tracy A. Shuford, PA-C was used as an Environmental consultant throughout this case, was essential for help with positioning the patient, positioning the extremity, management of the retractors, tissue manipulation, suture management and wound closure, and intraoperative decision making.     Metta Clines. Charles Niese, M.D.     KMS/MEDQ  D:  06/14/2016  T:  06/14/2016  Job:  [id number]

## 2016-06-14 NOTE — Op Note (Signed)
06/14/2016  12:58 PM  PATIENT:   Diana Pratt  70 y.o. female  PRE-OPERATIVE DIAGNOSIS:  RIGHT PROXIMAL HUMERAL MALUNION  POST-OPERATIVE DIAGNOSIS:  same  PROCEDURE:  R reverse shoulder arthroplasty, 36/+4 glenosphere, cemented 5.5 stem, +3 poly  SURGEON:  Sulay Brymer, Metta Clines M.D.  ASSISTANTS: Shuford pac   ANESTHESIA:   GET + ISB  EBL: 150  SPECIMEN:  none  Drains: none   PATIENT DISPOSITION:  PACU - hemodynamically stable.    PLAN OF CARE: Admit for overnight observation  Dictation# ???   Contact # [phone]

## 2016-06-15 ENCOUNTER — Encounter (HOSPITAL_COMMUNITY): Payer: Self-pay | Admitting: Orthopedic Surgery

## 2016-06-15 LAB — GLUCOSE, CAPILLARY
Glucose-Capillary: 241 mg/dL — ABNORMAL HIGH (ref 65–99)
Glucose-Capillary: 236 mg/dL — ABNORMAL HIGH (ref 65–99)

## 2016-06-15 MED ORDER — WHITE PETROLATUM GEL
Status: AC
  Administered 2016-06-15: 0.2
  Filled 2016-06-15: qty 1

## 2016-06-15 MED ORDER — LORAZEPAM 1 MG PO TABS: ORAL_TABLET | ORAL | 0 refills | Status: DC

## 2016-06-15 MED ORDER — ONDANSETRON HCL 4 MG PO TABS: 4.0000 mg | ORAL_TABLET | Freq: Three times a day (TID) | ORAL | 0 refills | Status: DC | PRN

## 2016-06-15 MED ORDER — OXYCODONE-ACETAMINOPHEN 5-325 MG PO TABS: 1.0000 | ORAL_TABLET | ORAL | 0 refills | Status: DC | PRN

## 2016-06-15 MED ORDER — LORAZEPAM 1 MG PO TABS
1.0000 mg | ORAL_TABLET | Freq: Two times a day (BID) | ORAL | Status: DC | PRN
  Administered 2016-06-15: 1 mg via ORAL
  Filled 2016-06-15: qty 1

## 2016-06-15 NOTE — Progress Notes (Signed)
This RN called into pt room. Pt reporting chest tightness and nausea after she states she "dropped the remote and have been reaching over the side of the bed trying to pick it up for about 30 minutes". Pt only reports chest tightness and nausea; denies blurry vision, radiation, headache, nor increased pain when center of chest palpated firmly. Pt rates pain from surgery 7/10. Pt given oxycodone for pain and zofran from nausea. Pt with increased 02 demand likely from over exertion from straining over bedside. Vitals read BP 131/48, P mid 80s, O2 SAT 94 on 2L O2. Pt presenting with weak cough and barely audible wheezing. Pt to receive nebulizer treatment with RRT. On call physician paged and updated; agreed with suggestion of EKG to be on the safe side. Pt appears calm and relaxed in bed. Nursing will continue to monitor pt.

## 2016-06-15 NOTE — Progress Notes (Signed)
PT Cancellation Note  Patient Details Name: VONDALEE LIEDEL MRN: [id number] DOB: 1945-12-31   Cancelled Treatment:    Reason Eval/Treat Not Completed: PT screened, no needs identified, will sign off. Pt was evaluated by Occupational Therapist and no physical therapy needs were identified. Pt does not need f/u services.   [place] 06/15/2016, 12:07 PM Sherie Don, [place], DPT [phone]

## 2016-06-15 NOTE — Evaluation (Signed)
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 PMH: DM, diabetic neuropathy, COPD, anxiety.   Clinical Impression   Pt was assisted for IADL, performing self care at a modified independent level prior to admission. Pt well versed in compensatory strategies and sling use as she has been living with R humerus fx and limited use of R UE. Pt educated in positioning R UE in bed and chair, reviewed ADL and sling use, performed exercises as ordered. Reinforced education with written handout. Pt verbalizing understanding. Pt ready for d/c from OT standpoint.    Follow Up Recommendations  No OT follow up    Equipment Recommendations  None recommended by OT    Recommendations for Other Services       Precautions / Restrictions Precautions Precautions: Shoulder Type of Shoulder Precautions: active protocol Shoulder Interventions: Shoulder sling/immobilizer;Off for dressing/bathing/exercises (may remove in controlled environment, on for sleep) Precaution Booklet Issued: Yes (comment) Required Braces or Orthoses: Sling Restrictions Weight Bearing Restrictions: Yes RUE Weight Bearing: Non weight bearing      Mobility Bed Mobility Overal bed mobility: Needs Assistance Bed Mobility: Supine to Sit     Supine to sit: Min assist     General bed mobility comments: min assist to raise trunk and use of rail, HOB up 30 degrees, pt typically gets up to L side of bed, instructed pt to avoid pushing or pulling with R UE.  Transfers Overall transfer level: Modified independent                    Balance                                            ADL Overall ADL's : Needs assistance/impaired Eating/Feeding: Independent;Sitting   Grooming: Wash/dry hands;Standing;Supervision/safety   Upper Body Bathing:  Minimal assitance;Sitting Upper Body Bathing Details (indicate cue type and reason): recommended bath sponge on a handle for back Lower Body Bathing: Supervison/ safety;Sit to/from stand   Upper Body Dressing : Moderate assistance;Sitting Upper Body Dressing Details (indicate cue type and reason): instructed to dress R UE first and undress last, front opening shirts recommended Lower Body Dressing: Minimal assistance;Sit to/from stand Lower Body Dressing Details (indicate cue type and reason): recommended elastic waist pants Toilet Transfer: Supervision/safety;Ambulation;Comfort height toilet   Toileting- Clothing Manipulation and Hygiene: Maximal assistance;Sit to/from stand Toileting - Clothing Manipulation Details (indicate cue type and reason): per PA note, pt may use R UE to wipe, needs assist due to nerve block still partially intact     Functional mobility during ADLs: Supervision/safety General ADL Comments: Instructed in positioning R UE in bed and in chair, sling use and wearing strategies for ADL. Pt knowledgeable due to hx of fracture.     Vision     Perception     Praxis      Pertinent Vitals/Pain Pain Assessment: No/denies pain (nerve block still partially intact)     Hand Dominance Right   Extremity/Trunk Assessment Upper Extremity Assessment Upper Extremity Assessment: RUE deficits/detail RUE Deficits / Details: performed PROM to 90 FF, 60 abd, instructed to avoid resistive IR, performed AAROM elbow to hand all x 10 seated in chair RUE: Unable to fully assess due to immobilization RUE Coordination:  decreased gross motor   Lower Extremity Assessment Lower Extremity Assessment: Overall WFL for tasks assessed       Communication Communication Communication: No difficulties   Cognition Arousal/Alertness: Awake/alert Behavior During Therapy: WFL for tasks assessed/performed Overall Cognitive Status: Within Functional Limits for tasks assessed                      General Comments       Exercises       Shoulder Instructions      Home Living Family/patient expects to be discharged to:: Private residence Living Arrangements: Spouse/significant other Available Help at Discharge: Family;Available 24 hours/day                                    Prior Functioning/Environment Level of Independence: Independent             OT Diagnosis: Generalized weakness;Acute pain   OT Problem List:     OT Treatment/Interventions:      OT Goals(Current goals can be found in the care plan section) Acute Rehab OT Goals Patient Stated Goal: to get her hair done  OT Frequency:     Barriers to D/C:            Co-evaluation              End of Session    Activity Tolerance: Patient tolerated treatment well (02 sats in low 90s to upper 80s on RA, encouraged incentive ) Patient left: in chair;with call bell/phone within reach   Time: 1003-1056 OT Time Calculation (min): 53 min Charges:  OT General Charges $OT Visit: 1 Procedure OT Evaluation $OT Eval Moderate Complexity: 1 Procedure OT Treatments $Self Care/Home Management : 8-22 mins $Therapeutic Activity: 8-22 mins $Therapeutic Exercise: 8-22 mins G-Codes:    Diana Pratt 06/15/2016, 11:13 AM  [phone]

## 2016-06-15 NOTE — Progress Notes (Signed)
On call-physician made aware of 12 lead EKG results. Advises to continue to monitor patient with same measures and update with any changes. Nursing will continue to  Monitor.

## 2016-06-15 NOTE — Discharge Summary (Signed)
PATIENT ID:      Diana Pratt  MRN:     [id number] DOB/AGE:    [birth year] / 70 y.o.     DISCHARGE SUMMARY  ADMISSION DATE:    06/14/2016 DISCHARGE DATE:    ADMISSION DIAGNOSIS: RIGHT PROXIMAL HUMERAL MALUNION Past Medical History:  Diagnosis Date  . Anxiety   . Asthma   . Back pain   . 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 COPD (chronic obstructive pulmonary disease) ([place])    dr. Kenn File  . Depression   . Diabetes mellitus   . Diabetic neuropathy ([place])   . GERD (gastroesophageal reflux disease)    occ heartburn  . Humerus fracture    right  . Hyperlipemia   . Hypertension   . Interstitial cystitis   . Osteoporosis   . Pneumonia    15 yrs ago  . PONV (postoperative nausea and vomiting)   . Shortness of breath dyspnea     DISCHARGE DIAGNOSIS:   Active Problems:   S/p reverse total shoulder arthroplasty   PROCEDURE: Procedure(s): REVERSE SHOULDER ARTHROPLASTY on 06/14/2016  CONSULTS:    HISTORY:  See H&P in chart.  HOSPITAL COURSE:  Diana Pratt is a 70 y.o. admitted on 06/14/2016 with a diagnosis of RIGHT PROXIMAL HUMERAL MALUNION.  They were brought to the operating room on 06/14/2016 and underwent Procedure(s): [place].    They were given perioperative antibiotics: Anti-infectives    Start     Dose/Rate Route Frequency Ordered Stop   06/14/16 1700  ceFAZolin (ANCEF) IVPB 2g/100 mL premix     2 g 200 mL/hr over 30 Minutes Intravenous Every 6 hours 06/14/16 1535 06/15/16 0443   06/14/16 0900  ceFAZolin (ANCEF) IVPB 2g/100 mL premix     2 g 200 mL/hr over 30 Minutes Intravenous To [facility] 06/13/16 1413 06/14/16 1056    .  Patient underwent the above named procedure and tolerated it well. The following day they were hemodynamically stable and pain was controlled on oral analgesics. She had some anxiety related issues through the first evening with chest discomfort but negative EKG. Her  home ativan was reordered They were neurovascularly intact to the operative extremity. OT was ordered and worked with patient per protocol. They were medically and orthopaedically stable for discharge on day 1 tentatively if did well with therapy.    DIAGNOSTIC STUDIES:  RECENT RADIOGRAPHIC STUDIES :  No results found.  RECENT VITAL SIGNS:  Patient Vitals for the past 24 hrs:  BP Temp Temp src Pulse Resp SpO2  06/15/16 0434 - - - - - 93 %  06/15/16 0408 (!) 131/48 98.4 F (36.9 C) Oral 90 18 91 %  06/15/16 0034 (!) 109/44 97.5 F (36.4 C) Oral 80 18 93 %  06/14/16 2051 - - - - - 95 %  06/14/16 2040 (!) 133/55 98.6 F (37 C) Oral 72 18 95 %  06/14/16 1542 (!) 97/55 97.9 F (36.6 C) Oral 60 18 95 %  06/14/16 1515 - 97.6 F (36.4 C) - - - -  06/14/16 1330 (!) 112/59 - - 75 18 96 %  06/14/16 1325 - - - 69 18 94 %  06/14/16 1021 (!) 115/32 - - 76 18 97 %  06/14/16 1016 (!) 121/46 - - 75 19 -  06/14/16 1005 (!) 133/34 - - 74 16 97 %  06/14/16 0945 (!) 125/44 - - 73 18 97 %  06/14/16 0944 (!) 139/39 - - 72 18 97 %  06/14/16 0940 (!) 146/43 - - 74 20 98 %  06/14/16 0935 (!) 140/52 - - 75 (!) 23 98 %  .  RECENT EKG RESULTS:    Orders placed or performed during the hospital encounter of 06/14/16  . EKG 12-Lead  . EKG 12-Lead    DISCHARGE INSTRUCTIONS:  Discharge Instructions    Discontinue IV    Complete by:  As directed      DISCHARGE MEDICATIONS:     Medication List    TAKE these medications   ACCU-CHEK AVIVA PLUS test strip Generic drug:  glucose blood 1 each by Other route See admin instructions. Check blood sugar 3 times daily   ACCU-CHEK SOFTCLIX LANCETS lancets 1 each by Other route See admin instructions. Check blood sugar 3 times daily   acetaminophen 325 MG tablet Commonly known as:  TYLENOL Take 650 mg by mouth every 6 (six) hours as needed. pain   albuterol (2.5 MG/3ML) 0.083% nebulizer solution Commonly known as:  PROVENTIL Take 2.5 mg by  nebulization every 6 (six) hours as needed. Shortness of breath   albuterol 108 (90 Base) MCG/ACT inhaler Commonly known as:  PROAIR HFA Inhale 2 puffs into the lungs every 6 (six) hours as needed for shortness of breath.   amLODipine-valsartan 10-320 MG tablet Commonly known as:  EXFORGE Take 1 tablet by mouth daily.   aspirin 81 MG EC tablet Take 81 mg by mouth daily.   budesonide-formoterol 160-4.5 MCG/ACT inhaler Commonly known as:  SYMBICORT Inhale 2 puffs into the lungs 2 (two) times daily.   calcium carbonate 200 MG capsule Take 250 mg by mouth daily.   CENTRUM SILVER PO Take 1 tablet by mouth daily.   escitalopram 10 MG tablet Commonly known as:  LEXAPRO Take 2 tablets (20 mg total) by mouth daily.   fluticasone 50 MCG/ACT nasal spray Commonly known as:  FLONASE Place 2 sprays into the nose daily as needed for allergies. Congestion   hydrochlorothiazide 25 MG tablet Commonly known as:  HYDRODIURIL Take 1 tablet (25 mg total) by mouth daily.   hydroxypropyl methylcellulose / hypromellose 2.5 % ophthalmic solution Commonly known as:  ISOPTO TEARS / GONIOVISC Place 1 drop into both eyes daily as needed. for dry eyes   Insulin Glargine 100 UNIT/ML Solostar Pen Commonly known as:  LANTUS SOLOSTAR Inject 50 Units into the skin daily at 10 pm.   insulin lispro 100 UNIT/ML KiwkPen Commonly known as:  HUMALOG Inject 0.06-0.08 mLs (6-8 Units total) into the skin 3 (three) times daily with meals.   LORazepam 1 MG tablet Commonly known as:  ATIVAN TAKE 1 TABLET TWICE DAILY AS NEEDED FOR ANXIETY What changed:  how much to take  how to take this  when to take this  reasons to take this  additional instructions   ondansetron 4 MG tablet Commonly known as:  ZOFRAN Take 1 tablet (4 mg total) by mouth every 8 (eight) hours as needed for nausea or vomiting.   oxyCODONE-acetaminophen 5-325 MG tablet Commonly known as:  PERCOCET Take 1-2 tablets by mouth every 4  (four) hours as needed.   Pen Needles 31G X 6 MM Misc Use to administer insulin once qid. Dx E11.59 Use Leader brand   promethazine 25 MG tablet Commonly known as:  PHENERGAN Take 1 tablet (25 mg total) by mouth every 6 (six) hours as needed for nausea or vomiting.   sitaGLIPtin 100 MG tablet Commonly  known as:  JANUVIA Take 1 tablet (100 mg total) by mouth daily.   Vitamin D3 2000 units capsule Take 2,000 Units by mouth daily.       FOLLOW UP VISIT:   Follow-up Information    Metta Clines SUPPLE, MD.   Specialty:  Orthopedic Surgery Why:  call to be seen in 10-14 days Contact information: [address] [place] [address] [phone]           DISCHARGE TO: Home  DISPOSITION: Stable  DISCHARGE CONDITION:  Festus Barren for Dr. Justice Britain 06/15/2016, 8:37 AM

## 2016-06-15 NOTE — Progress Notes (Signed)
DEYANI SPAINHOWER  MRN: [id number] DOB/Age: November 09, 1945 70 y.o. Physician: Rada Hay Procedure: Procedure(s) (LRB): REVERSE SHOULDER ARTHROPLASTY (Right)     Subjective: Not able to rest and chest discomfort and anxiety through night. EKG ordered which showed no cardiac changes. Still anxious and block still in effect  Vital Signs Temp:  [97.5 F (36.4 C)-98.6 F (37 C)] 98.4 F (36.9 C) (08/11 0408) Pulse Rate:  [60-90] 90 (08/11 0408) Resp:  [16-23] 18 (08/11 0408) BP: (97-146)/(32-59) 131/48 (08/11 0408) SpO2:  [91 %-98 %] 93 % (08/11 0434)  Lab Results No results for input(s): WBC, HGB, HCT, PLT in the last 72 hours. BMET No results for input(s): NA, K, CL, CO2, GLUCOSE, BUN, CREATININE, CALCIUM in the last 72 hours. No results found for: INR   Exam Has motor returning to hand, dressing dry        Plan Will monitor and get PT/OT to work with her. I suspect she will be ready for a late discharge. Her biggest concern is toileting and I have explained that it is ok to use that arm for hygiene etc.  Somara Frymire PA-C  for Dr.Kevin Supple 06/15/2016, 8:28 AM Contact # [phone]

## 2016-06-15 NOTE — Anesthesia Postprocedure Evaluation (Signed)
Anesthesia Post Note  Patient: Diana Pratt  Procedure(s) Performed: Procedure(s) (LRB): REVERSE SHOULDER ARTHROPLASTY (Right)  Patient location during evaluation: PACU Anesthesia Type: General Level of consciousness: awake and alert Pain management: pain level controlled Vital Signs Assessment: post-procedure vital signs reviewed and stable Respiratory status: spontaneous breathing, nonlabored ventilation and respiratory function stable Cardiovascular status: blood pressure returned to baseline and stable Postop Assessment: no signs of nausea or vomiting Anesthetic complications: no    Last Vitals:  Vitals:   06/15/16 0408 06/15/16 1020  BP: (!) 131/48 113/64  Pulse: 90   Resp: 18   Temp: 36.9 C     Last Pain:  Vitals:   06/15/16 1242  TempSrc:   PainSc: 3                  [name]

## 2016-06-15 NOTE — Care Management Note (Signed)
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 States she does not want HH at this time. States she has a small dog in the home. She plans to follow up with surgeon in two weeks and will discuss going to outpt PT. States her husband is retired and able to assist her with ADL's. Dtr, Anderson Malta will be there on weekends. No DME needed.   Billey Co MD    Expected Discharge Date:  06/15/2016              Expected Discharge Plan:  Home/Self Care  In-House Referral:  NA  Discharge planning Services  CM Consult  Post Acute Care Choice:  Home Health Choice offered to:  Patient  DME Arranged:  N/A DME Agency:  NA  HH Arranged:  Patient Refused [place] Agency:  NA  Status of Service:  Completed, signed off  If discussed at [place] of Stay Meetings, dates discussed:    Additional Comments:  Erenest Rasher, RN 06/15/2016, 11:41 AM

## 2016-06-15 NOTE — Progress Notes (Signed)
Patient is discharged from room 5N09 at this time. Alert and in stable condition. IV site d/c'd and instructions read to patient and husband with understanding verbalized. Left unit via wheelchair with all belongings at side.

## 2016-06-19 DIAGNOSIS — S42294P Other nondisplaced fracture of upper end of right humerus, subsequent encounter for fracture with malunion: Secondary | ICD-10-CM | POA: Diagnosis not present

## 2016-06-19 DIAGNOSIS — R600 Localized edema: Secondary | ICD-10-CM | POA: Diagnosis not present

## 2016-06-21 ENCOUNTER — Encounter (HOSPITAL_COMMUNITY): Payer: Self-pay

## 2016-06-21 ENCOUNTER — Emergency Department (HOSPITAL_COMMUNITY)
Admission: EM | Admit: 2016-06-21 | Discharge: 2016-06-22 | Disposition: A | Discharge status: Home / Self Care | Payer: Medicare Other | Attending: Emergency Medicine | Admitting: Emergency Medicine

## 2016-06-21 ENCOUNTER — Emergency Department (HOSPITAL_COMMUNITY): Payer: Medicare Other

## 2016-06-21 DIAGNOSIS — R110 Nausea: Secondary | ICD-10-CM | POA: Diagnosis not present

## 2016-06-21 DIAGNOSIS — Z87891 Personal history of nicotine dependence: Secondary | ICD-10-CM | POA: Insufficient documentation

## 2016-06-21 DIAGNOSIS — I10 Essential (primary) hypertension: Secondary | ICD-10-CM | POA: Insufficient documentation

## 2016-06-21 DIAGNOSIS — E119 Type 2 diabetes mellitus without complications: Secondary | ICD-10-CM | POA: Diagnosis not present

## 2016-06-21 DIAGNOSIS — Z794 Long term (current) use of insulin: Secondary | ICD-10-CM | POA: Diagnosis not present

## 2016-06-21 DIAGNOSIS — J449 Chronic obstructive pulmonary disease, unspecified: Secondary | ICD-10-CM | POA: Diagnosis not present

## 2016-06-21 DIAGNOSIS — T402X5A Adverse effect of other opioids, initial encounter: Secondary | ICD-10-CM

## 2016-06-21 DIAGNOSIS — K5903 Drug induced constipation: Secondary | ICD-10-CM

## 2016-06-21 DIAGNOSIS — J45909 Unspecified asthma, uncomplicated: Secondary | ICD-10-CM | POA: Diagnosis not present

## 2016-06-21 DIAGNOSIS — Z79899 Other long term (current) drug therapy: Secondary | ICD-10-CM | POA: Insufficient documentation

## 2016-06-21 DIAGNOSIS — R1030 Lower abdominal pain, unspecified: Secondary | ICD-10-CM | POA: Diagnosis not present

## 2016-06-21 DIAGNOSIS — K5900 Constipation, unspecified: Secondary | ICD-10-CM | POA: Diagnosis not present

## 2016-06-21 MED ORDER — BISACODYL 10 MG RE SUPP
10.0000 mg | Freq: Once | RECTAL | Status: AC
  Administered 2016-06-21: 10 mg via RECTAL
  Filled 2016-06-21: qty 1

## 2016-06-21 NOTE — ED Provider Notes (Signed)
[facility] DEPT Provider Note   CSN: [id number] Arrival date & time: 06/21/16  2250  By signing my name below, I, Irene Pap, attest that this documentation has been prepared under the direction and in the presence of Rolland Porter, MD. Electronically Signed: Irene Pap, ED Scribe. 06/21/16. 11:32 PM.  Pt seen 23:26 PM  History   Chief Complaint Chief Complaint  Patient presents with  . Constipation   The history is provided by the patient. No language interpreter was used.  HPI Comments: TWANETTE REUBER is a 70 y.o. Female with a hx of cancer, COPD, DM, and HTN who presents to the Emergency Department complaining of gradually worsening constipation onset one week ago. Pt reports associated abdominal discomfort on the left,  Some abdominal distention on the left, decreased appetite since surgery, nausea, and anxiety due to her constipation.. Pt has taken stool softeners and attempted enema tonight for her symptoms to no relief. Pt is 1 week s/p shoulder replacement surgery and has been on pain medication. Pt reports hx of similar symptoms, many years ago but not this severe. She denies vomiting.   PCP: Kenn File, MD Orthopedics Dr Onnie Graham  Past Medical History:  Diagnosis Date  . Anxiety   . Asthma   . Back pain   . 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 COPD (chronic obstructive pulmonary disease) ([place])    dr. Kenn File  . Depression   . Diabetes mellitus   . Diabetic neuropathy ([place])   . GERD (gastroesophageal reflux disease)    occ heartburn  . Humerus fracture    right  . Hyperlipemia   . Hypertension   . Interstitial cystitis   . Osteoporosis   . Pneumonia    15 yrs ago  . PONV (postoperative nausea and vomiting)   . Shortness of breath dyspnea     Patient Active Problem List   Diagnosis Date Noted  . 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 Abdominal pain, acute, bilateral lower quadrant  07/01/2014  . Transaminitis 07/01/2014  . COPD (chronic obstructive pulmonary disease) ([place])   . Asthma   . Interstitial cystitis   . Unspecified vitamin D deficiency 05/29/2013  . Lumbar radiculopathy 05/29/2013  . Back strain 05/29/2013  . Unspecified asthma(493.90) 02/27/2013  . Depression with anxiety 02/22/2011  . Uncontrolled type 2 diabetes mellitus with insulin therapy ([place]) 02/22/2011  . Hyperlipemia 02/22/2011  . HTN (hypertension) 02/22/2011    Past Surgical History:  Procedure Laterality Date  . ABDOMINAL HYSTERECTOMY  03/1984   partial  . COLONOSCOPY N/A 07/19/2014   Procedure: COLONOSCOPY;  Surgeon: Danie Binder, MD;  [facility];  Service: Endoscopy;  Laterality: N/A;  9:30  . REVERSE SHOULDER ARTHROPLASTY Right 06/14/2016   Procedure: REVERSE SHOULDER ARTHROPLASTY;  Surgeon: Justice Britain, MD;  Location: [facility];  Service: Orthopedics;  Laterality: Right;    OB History    Gravida Para Term Preterm AB Living   2 1 1   1 1    SAB TAB Ectopic Multiple Live Births                   Home Medications    Prior to Admission medications   Medication Sig Start Date End Date Taking? Authorizing Provider  ACCU-CHEK AVIVA PLUS test strip 1 each by Other route See admin instructions. Check blood sugar 3 times daily 03/21/16   Historical Provider, MD  [place]  lancets 1 each by Other route See admin instructions. Check blood sugar 3 times daily 03/21/16   Historical Provider, MD  acetaminophen (TYLENOL) 325 MG tablet Take 650 mg by mouth every 6 (six) hours as needed. pain    Historical Provider, MD  albuterol (PROAIR HFA) 108 (90 BASE) MCG/ACT inhaler Inhale 2 puffs into the lungs every 6 (six) hours as needed for shortness of breath. 01/21/15   Wardell Honour, MD  albuterol (PROVENTIL) (2.5 MG/3ML) 0.083% nebulizer solution Take 2.5 mg by nebulization every 6 (six) hours as needed. Shortness of breath     Historical Provider, MD    amLODipine-valsartan (EXFORGE) 10-320 MG tablet Take 1 tablet by mouth daily. 01/09/16   Wardell Honour, MD  aspirin 81 MG EC tablet Take 81 mg by mouth daily.      Historical Provider, MD  budesonide-formoterol (SYMBICORT) 160-4.5 MCG/ACT inhaler Inhale 2 puffs into the lungs 2 (two) times daily. 12/25/13   Vernie Shanks, MD  calcium carbonate 200 MG capsule Take 250 mg by mouth daily.     Historical Provider, MD  Cholecalciferol (VITAMIN D3) 2000 UNITS capsule Take 2,000 Units by mouth daily.  01/07/14   Vernie Shanks, MD  escitalopram (LEXAPRO) 10 MG tablet Take 2 tablets (20 mg total) by mouth daily. 05/20/15   Wardell Honour, MD  fluticasone (FLONASE) 50 MCG/ACT nasal spray Place 2 sprays into the nose daily as needed for allergies. Congestion     Historical Provider, MD  hydrochlorothiazide (HYDRODIURIL) 25 MG tablet Take 1 tablet (25 mg total) by mouth daily. 01/09/16   Wardell Honour, MD  hydroxypropyl methylcellulose (ISOPTO TEARS) 2.5 % ophthalmic solution Place 1 drop into both eyes daily as needed. for dry eyes     Historical Provider, MD  Insulin Glargine (LANTUS SOLOSTAR) 100 UNIT/ML Solostar Pen Inject 50 Units into the skin daily at 10 pm. 03/21/16   Cherre Robins, PharmD  insulin lispro (HUMALOG) 100 UNIT/ML KiwkPen Inject 0.06-0.08 mLs (6-8 Units total) into the skin 3 (three) times daily with meals. 03/21/16   Cherre Robins, PharmD  Insulin Pen Needle (PEN NEEDLES) 31G X 6 MM MISC Use to administer insulin once qid. Dx E11.59 Use Leader brand 04/06/16   Wardell Honour, MD  LORazepam (ATIVAN) 1 MG tablet TAKE 1 TABLET TWICE DAILY AS NEEDED FOR ANXIETY 06/15/16   Jenetta Loges, PA-C  Multiple Vitamins-Minerals (CENTRUM SILVER PO) Take 1 tablet by mouth daily.     Historical Provider, MD  ondansetron (ZOFRAN) 4 MG tablet Take 1 tablet (4 mg total) by mouth every 8 (eight) hours as needed for nausea or vomiting. 06/15/16   Jenetta Loges, PA-C  oxyCODONE-acetaminophen (PERCOCET) 5-325 MG  tablet Take 1-2 tablets by mouth every 4 (four) hours as needed. 06/15/16   Olivia Mackie Shuford, PA-C  promethazine (PHENERGAN) 25 MG tablet Take 1 tablet (25 mg total) by mouth every 6 (six) hours as needed for nausea or vomiting. 05/31/14   Milton Ferguson, MD  sitaGLIPtin (JANUVIA) 100 MG tablet Take 1 tablet (100 mg total) by mouth daily. 04/06/16   Cherre Robins, PharmD    Family History Family History  Problem Relation Age of Onset  . Parkinsonism Mother   . Dementia Mother   . Colon cancer Father 11    MULTIPLE CO-MORBIDITIES  . Cancer Father   . Diabetes Paternal Aunt   . Diabetes Paternal Uncle   . Diabetes Paternal Grandmother   . Colon polyps Neg Hx  Social History Social History  Substance Use Topics  . Smoking status: Former Smoker    Packs/day: 0.50    Years: 34.00    Quit date: 06/01/1994  . Smokeless tobacco: Never Used  . Alcohol use Yes     Comment: once a month  lives at home Lives with spouse   Allergies   Lipitor [atorvastatin]; Livalo [pitavastatin]; Morphine and related; Penicillins; Simvastatin; Sulfa antibiotics; and Zetia [ezetimibe]   Review of Systems Review of Systems  Constitutional: Positive for appetite change.  Gastrointestinal: Positive for abdominal distention, abdominal pain, constipation and nausea. Negative for vomiting.  Psychiatric/Behavioral: The patient is nervous/anxious.   All other systems reviewed and are negative.    Physical Exam Updated Vital Signs BP 144/94 (BP Location: Left Arm)   Pulse 117   Temp 98.2 F (36.8 C) (Oral)   Resp 18   Ht 5\' 4"  (1.626 m)   Wt 195 lb (88.5 kg)   SpO2 97%   BMI 33.47 kg/m   Vital signs normal except for tachycardia   Physical Exam  Constitutional: She is oriented to person, place, and time. She appears well-developed and well-nourished.  Non-toxic appearance. She does not appear ill. No distress.  HENT:  Head: Normocephalic and atraumatic.  Right Ear: External ear normal.  Left  Ear: External ear normal.  Nose: Nose normal. No mucosal edema or rhinorrhea.  Mouth/Throat: Oropharynx is clear and moist and mucous membranes are normal. No dental abscesses or uvula swelling.  Eyes: Conjunctivae and EOM are normal. Pupils are equal, round, and reactive to light.  Neck: Normal range of motion and full passive range of motion without pain. Neck supple.  Cardiovascular: Normal rate, regular rhythm and normal heart sounds.  Exam reveals no gallop and no friction rub.   No murmur heard. Pulmonary/Chest: Effort normal and breath sounds normal. No respiratory distress. She has no wheezes. She has no rhonchi. She has no rales. She exhibits no tenderness and no crepitus.  Abdominal: Soft. Normal appearance and bowel sounds are normal. She exhibits no distension. There is tenderness. There is no rebound and no guarding.  Mildly tender  Musculoskeletal: Normal range of motion. She exhibits no edema or tenderness.  Moves all extremities well. Wearing shoulder sling on RUE.   Neurological: She is alert and oriented to person, place, and time. She has normal strength. No cranial nerve deficit.  Skin: Skin is warm, dry and intact. No rash noted. No erythema. No pallor.  Psychiatric: She has a normal mood and affect. Her speech is normal and behavior is normal. Her mood appears not anxious.  Nursing note and vitals reviewed.    ED Treatments / Results  DIAGNOSTIC STUDIES: Oxygen Saturation is 97% on RA, normal by my interpretation.      Labs (all labs ordered are listed, but only abnormal results are displayed) Labs Reviewed - No data to display  EKG  EKG Interpretation None       Radiology Dg Abd 2 Views  Result Date: 06/21/2016 CLINICAL DATA:  Acute onset of lower abdominal and rectal pain. Initial encounter. EXAM: ABDOMEN - 2 VIEW COMPARISON:  CT of the abdomen and pelvis from 05/31/2014 FINDINGS: The visualized bowel gas pattern is unremarkable. Scattered air and stool  filled loops of colon are seen; no abnormal dilatation of small bowel loops is seen to suggest small bowel obstruction. No free intra-abdominal air is identified, though evaluation for free air is limited on a single supine view. The visualized osseous structures  are within normal limits; the sacroiliac joints are unremarkable in appearance. The visualized lung bases are essentially clear. IMPRESSION: Unremarkable bowel gas pattern; no free intra-abdominal air seen. Moderate amount of stool noted in the colon. Electronically Signed   By: Garald Balding M.D.   On: 06/21/2016 23:57    Procedures Procedures (including critical care time)  Medications Ordered in ED Medications  bisacodyl (DULCOLAX) suppository 10 mg (10 mg Rectal Given 06/21/16 2356)     Initial Impression / Assessment and Plan / ED Course  I have reviewed the triage vital signs and the nursing notes.  Pertinent labs & imaging results that were available during my care of the patient were reviewed by me and considered in my medical decision making (see chart for details).  Clinical Course   11:30 PM-Discussed treatment plan which includes x-ray and suppository with pt at bedside and pt agreed to plan.   1:00 AM-Pt reports relief with Ducolax stool softener.  She will be discharged with Miralax.   Final Clinical Impressions(s) / ED Diagnoses   Final diagnoses:  Constipation due to opioid therapy    New Prescriptions OTC miralax and dulcolax suppositories  Plan discharge  Rolland Porter, MD, FACEP   I personally performed the services described in this documentation, which was scribed in my presence. The recorded information has been reviewed and considered.  Rolland Porter, MD, Barbette Or, MD 06/22/16 [phone]

## 2016-06-21 NOTE — ED Triage Notes (Signed)
Patient c/o constipation X1 week. Patient had recent surgery and is on pain medication- patient states she has taken stool softener, and enema with no relief.

## 2016-06-22 NOTE — Discharge Instructions (Signed)
Get miralax and put one dose or 17 g in 8 ounces of water,  take 1 dose every 30 minutes for 2-3 hours or until you  get good results and then once or twice daily to prevent constipation. You can also use dulcolax suppositories OTC to soften the stool in your rectum to help your pass it better.   Recheck if you get abdominal pain, vomiting or fever.

## 2016-06-22 NOTE — ED Notes (Signed)
Pt attempted to have BM with no success.  This nurse to room to assess. Stool present at rectal opening. Disimpaction performed with good results. Pt tolerated well. Dr Tomi Bamberger made aware. Pt ambulatory to bathroom.

## 2016-06-25 DIAGNOSIS — Z96611 Presence of right artificial shoulder joint: Secondary | ICD-10-CM | POA: Diagnosis not present

## 2016-06-25 DIAGNOSIS — Z471 Aftercare following joint replacement surgery: Secondary | ICD-10-CM | POA: Diagnosis not present

## 2016-06-27 ENCOUNTER — Encounter: Payer: Self-pay | Admitting: Physical Therapy

## 2016-06-27 ENCOUNTER — Ambulatory Visit: Payer: Medicare Other | Attending: Orthopedic Surgery | Admitting: Physical Therapy

## 2016-06-27 DIAGNOSIS — M25611 Stiffness of right shoulder, not elsewhere classified: Secondary | ICD-10-CM | POA: Insufficient documentation

## 2016-06-27 DIAGNOSIS — M25511 Pain in right shoulder: Secondary | ICD-10-CM

## 2016-06-27 NOTE — Therapy (Signed)
[facility] [place], [place], [address] Phone: [phone]   Fax:  [phone]  Physical Therapy Evaluation  Patient Details  Name: Diana Pratt MRN: [id number] Date of Birth: [birth year] Referring Provider: Justice Britain  Encounter Date: 06/27/2016      PT End of Session - 06/27/16 1037    Visit Number 1   Number of Visits 24   Date for PT Re-Evaluation 08/22/16   PT Start Time 1038   PT Stop Time 1125   PT Time Calculation (min) 47 min   Activity Tolerance Patient tolerated treatment well   Behavior During Therapy [facility] for tasks assessed/performed      Past Medical History:  Diagnosis Date  . Anxiety   . Asthma   . Back pain   . 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 COPD (chronic obstructive pulmonary disease) ([place])    dr. Kenn File  . Depression   . Diabetes mellitus   . Diabetic neuropathy ([place])   . GERD (gastroesophageal reflux disease)    occ heartburn  . Humerus fracture    right  . Hyperlipemia   . Hypertension   . Interstitial cystitis   . Osteoporosis   . Pneumonia    15 yrs ago  . PONV (postoperative nausea and vomiting)   . Shortness of breath dyspnea     Past Surgical History:  Procedure Laterality Date  . ABDOMINAL HYSTERECTOMY  03/1984   partial  . COLONOSCOPY N/A 07/19/2014   Procedure: COLONOSCOPY;  Surgeon: Danie Binder, MD;  [facility];  Service: Endoscopy;  Laterality: N/A;  9:30  . REVERSE SHOULDER ARTHROPLASTY Right 06/14/2016   Procedure: REVERSE SHOULDER ARTHROPLASTY;  Surgeon: Justice Britain, MD;  Location: [facility];  Service: Orthopedics;  Laterality: Right;    There were no vitals filed for this visit.       Subjective Assessment - 06/27/16 1037    Subjective Patient underwent R reverse TSA on 06/14/16. She is presently in a sling. Patient had originally had a humerus fracture.   Patient is accompained by: Family member    Pertinent History DM, OP, COPD   Patient Stated Goals get over this pain and get back to using this arm   Currently in Pain? Yes   Pain Score 8    Pain Location Shoulder   Pain Orientation Right   Pain Descriptors / Indicators Heaviness;Other (Comment)  like a bee sting   Pain Onset 1 to 4 weeks ago   Pain Frequency Constant   Aggravating Factors  nothing   Pain Relieving Factors ice, meds   Effect of Pain on Daily Activities unable to use arm   Multiple Pain Sites No            [facility] PT Assessment - 06/27/16 0001      Assessment   Medical Diagnosis s/p R Reverse TSA   Referring Provider Justice Britain   Onset Date/Surgical Date 06/14/16   Hand Dominance Right   Next MD Visit 07/27/16   Prior Therapy no     Precautions   Precautions Shoulder   Precaution Comments SEE PROTOCOL   Required Braces or Orthoses Sling     Balance Screen   Has the patient fallen in the past 6 months No   Has the patient had a decrease in activity level because of a fear of falling?  Yes   Is the patient reluctant to  leave their home because of a fear of falling?  No     Home Ecologist residence   Living Arrangements Spouse/significant other   Additional Comments no issues accessing house     Prior Function   Level of Independence Needs assistance with ADLs     ROM / Strength   AROM / PROM / Strength PROM     PROM   PROM Assessment Site Shoulder   Right/Left Shoulder Right   Right Shoulder Flexion 60 Degrees   Right Shoulder ABduction 45 Degrees   Right Shoulder Internal Rotation --  to waist   Right Shoulder External Rotation -40 Degrees  at neutral; -17 at 45 deg ABD     Palpation   Palpation comment Along incision, R pecs                    [facility] Adult PT Treatment/Exercise - 06/27/16 0001      Modalities   Modalities Electrical Stimulation;Vasopneumatic     Acupuncturist Location R shoulder    Electrical Stimulation Action IFC    Electrical Stimulation Parameters 80-150Hz  x 15 min   Electrical Stimulation Goals Pain     Vasopneumatic   Number Minutes Vasopneumatic  15 minutes   Vasopnuematic Location  Shoulder   Vasopneumatic Pressure Low   Vasopneumatic Temperature  34                PT Education - 06/27/16 1114    Education provided Yes   Education Details HEP   Person(s) Educated Patient;Spouse   Methods Explanation;Demonstration   Comprehension Verbalized understanding;Returned demonstration          PT Short Term Goals - 06/27/16 1208      PT SHORT TERM GOAL #1   Title Improve R shoulder ER to 20 degrees   Time 4   Period Weeks   Status New     PT SHORT TERM GOAL #2   Title decrease R shoulder pain to 5/10.   Time 4   Period Weeks   Status New           PT Long Term Goals - 06/27/16 1208      PT LONG TERM GOAL #1   Title I with HEP   Time 8   Period Weeks   Status New     PT LONG TERM GOAL #2   Title Improved R shoulder ROM to [facility]   Time 8   Period Weeks   Status New     PT LONG TERM GOAL #3   Title Decreased pain in R shoulder to 2-3/10 with functional activities   Time 8   Period Weeks   Status New     PT LONG TERM GOAL #4   Title R shoulder strength 4+/5 or better to improve function (Not to be addressed until okayed by protocol/MD)   Time 8   Period Weeks   Status New               Plan - 06/27/16 1114    Clinical Impression Statement Patient had a reverse TSA on 06/14/16. She has pain and is limited with ROM restricting ADLS.    Rehab Potential Excellent   PT Frequency 3x / week   PT Duration 8 weeks   PT Treatment/Interventions ADLs/Self Care Home Management;Electrical Stimulation;Patient/family education;Neuromuscular re-education;Therapeutic exercise;Manual techniques;Passive range of motion;Vasopneumatic Device   PT Next Visit Plan PROM per protocol (0-6  wks: ER 20 deg, ABD 45, flex 60); NO  pendulum/support GH joint in supine; modalities for pain.   PT Home Exercise Plan elbow, wrist ROM and hand squeezes   Consulted and Agree with Plan of Care Patient      Patient will benefit from skilled therapeutic intervention in order to improve the following deficits and impairments:  Decreased range of motion, Impaired UE functional use, Pain, Decreased strength  Visit Diagnosis: Stiffness of right shoulder, not elsewhere classified - Plan: PT plan of care cert/re-cert  Pain in right shoulder - Plan: PT plan of care cert/re-cert      G-Codes - [id number] 14-Feb-1216    Functional Assessment Tool Used FOTO 96% limited   Functional Limitation Other PT primary   Other PT Primary Current Status [id number]) At least 80 percent but less than 100 percent impaired, limited or restricted   Other PT Primary Goal Status [id number]) At least 40 percent but less than 60 percent impaired, limited or restricted       Problem List Patient Active Problem List   Diagnosis Date Noted  . 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 Abdominal pain, acute, bilateral lower quadrant 07/01/2014  . Transaminitis 07/01/2014  . COPD (chronic obstructive pulmonary disease) ([place])   . Asthma   . Interstitial cystitis   . Unspecified vitamin D deficiency 05/29/2013  . Lumbar radiculopathy 05/29/2013  . Back strain 05/29/2013  . Unspecified asthma(493.90) 02/27/2013  . Depression with anxiety 02/22/2011  . Uncontrolled type 2 diabetes mellitus with insulin therapy ([place]) 02/22/2011  . Hyperlipemia 02/22/2011  . HTN (hypertension) 02/22/2011    Madelyn Flavors PT 06/27/2016, 12:23 PM  [facility] [address] [place], [place], [address] Phone: [phone]   Fax:  [phone]  Name: Diana Pratt MRN: [id number] Date of Birth: [birth year]

## 2016-06-28 ENCOUNTER — Ambulatory Visit: Payer: Self-pay | Admitting: Pharmacist

## 2016-06-28 ENCOUNTER — Ambulatory Visit: Payer: Medicare Other | Admitting: *Deleted

## 2016-06-28 DIAGNOSIS — M25511 Pain in right shoulder: Secondary | ICD-10-CM

## 2016-06-28 DIAGNOSIS — M25611 Stiffness of right shoulder, not elsewhere classified: Secondary | ICD-10-CM | POA: Diagnosis not present

## 2016-06-28 NOTE — Therapy (Signed)
[facility] [place], [place], [address] Phone: [phone]   Fax:  [phone]  Physical Therapy Treatment  Patient Details  Name: Diana Pratt MRN: [id number] Date of Birth: August 23, 1946 Referring Provider: Justice Britain  Encounter Date: 06/28/2016      PT End of Session - 06/28/16 1738    Visit Number 2   Number of Visits 24   Date for PT Re-Evaluation 08/22/16   PT Start Time [id number]   PT Stop Time 1739   PT Time Calculation (min) 53 min      Past Medical History:  Diagnosis Date  . Anxiety   . Asthma   . Back pain   . 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 COPD (chronic obstructive pulmonary disease) ([place])    dr. Kenn File  . Depression   . Diabetes mellitus   . Diabetic neuropathy ([place])   . GERD (gastroesophageal reflux disease)    occ heartburn  . Humerus fracture    right  . Hyperlipemia   . Hypertension   . Interstitial cystitis   . Osteoporosis   . Pneumonia    15 yrs ago  . PONV (postoperative nausea and vomiting)   . Shortness of breath dyspnea     Past Surgical History:  Procedure Laterality Date  . ABDOMINAL HYSTERECTOMY  03/1984   partial  . COLONOSCOPY N/A 07/19/2014   Procedure: COLONOSCOPY;  Surgeon: Danie Binder, MD;  [facility];  Service: Endoscopy;  Laterality: N/A;  9:30  . REVERSE SHOULDER ARTHROPLASTY Right 06/14/2016   Procedure: REVERSE SHOULDER ARTHROPLASTY;  Surgeon: Justice Britain, MD;  Location: [facility];  Service: Orthopedics;  Laterality: Right;    There were no vitals filed for this visit.      Subjective Assessment - 06/28/16 1724    Subjective Patient underwent R reverse TSA on 06/14/16. She is presently in a sling. Patient had originally had a humerus fracture.   Patient is accompained by: Family member   Pertinent History DM, OP, COPD   Patient Stated Goals get over this pain and get back to using this arm    Currently in Pain? Yes   Pain Score 7    Pain Location Shoulder   Pain Orientation Right   Pain Descriptors / Indicators Heaviness   Pain Type Acute pain   Pain Onset 1 to 4 weeks ago   Pain Frequency Constant   Aggravating Factors  sleeping in recliner                         [facility] Adult PT Treatment/Exercise - 06/28/16 0001      Modalities   Modalities Electrical Stimulation;Vasopneumatic;Cryotherapy     Cryotherapy   Number Minutes Cryotherapy 15 Minutes   Cryotherapy Location Shoulder   Type of Cryotherapy Ice pack     Electrical Stimulation   Electrical Stimulation Location R shoulder IFC x 15 min 1-10hz    Electrical Stimulation Goals Pain     Manual Therapy   Manual Therapy Passive ROM   Passive ROM PROM to RT shldr with good support within precautions Flexion to 60 degrees, Er to 20 , and Abd to 45 degrees supine position                PT Education - 06/27/16 1114    Education provided Yes   Education Details HEP   Person(s) Educated Patient;Spouse  Methods Explanation;Demonstration   Comprehension Verbalized understanding;Returned demonstration          PT Short Term Goals - July 01, 2016 02-08-1207      PT SHORT TERM GOAL #1   Title Improve R shoulder ER to 20 degrees   Time 4   Period Weeks   Status New     PT SHORT TERM GOAL #2   Title decrease R shoulder pain to 5/10.   Time 4   Period Weeks   Status New           PT Long Term Goals - 2016/07/01 1207/02/08      PT LONG TERM GOAL #1   Title I with HEP   Time 8   Period Weeks   Status New     PT LONG TERM GOAL #2   Title Improved R shoulder ROM to [facility]   Time 8   Period Weeks   Status New     PT LONG TERM GOAL #3   Title Decreased pain in R shoulder to 2-3/10 with functional activities   Time 8   Period Weeks   Status New     PT LONG TERM GOAL #4   Title R shoulder strength 4+/5 or better to improve function (Not to be addressed until okayed by protocol/MD)   Time 8    Period Weeks   Status New               Plan - 06/28/16 1739    Clinical Impression Statement Pt did well with Rx today. She is post-op 2 weeks from TSA. She was able to reach protocol motions of flexion to 60 degrees, ER to 20 and Abd to 45 degrees.    Rehab Potential Excellent   PT Frequency 3x / week   PT Duration 8 weeks   PT Treatment/Interventions ADLs/Self Care Home Management;Electrical Stimulation;Patient/family education;Neuromuscular re-education;Therapeutic exercise;Manual techniques;Passive range of motion;Vasopneumatic Device   PT Next Visit Plan PROM per protocol (0-6 wks: ER 20 deg, ABD 45, flex 60); NO pendulum/support GH joint in supine; modalities for pain.  MD F/U 07-27-16   PT Home Exercise Plan elbow, wrist ROM and hand squeezes   Consulted and Agree with Plan of Care Patient      Patient will benefit from skilled therapeutic intervention in order to improve the following deficits and impairments:     Visit Diagnosis: Stiffness of right shoulder, not elsewhere classified  Pain in right shoulder       G-Codes - Jul 01, 2016 1216-02-08    Functional Assessment Tool Used FOTO 96% limited   Functional Limitation Other PT primary   Other PT Primary Current Status [id number]) At least 80 percent but less than 100 percent impaired, limited or restricted   Other PT Primary Goal Status [id number]) At least 40 percent but less than 60 percent impaired, limited or restricted      Problem List Patient Active Problem List   Diagnosis Date Noted  . 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 Abdominal pain, acute, bilateral lower quadrant 07/01/2014  . Transaminitis 07/01/2014  . COPD (chronic obstructive pulmonary disease) ([place])   . Asthma   . Interstitial cystitis   . Unspecified vitamin D deficiency 05/29/2013  . Lumbar radiculopathy 05/29/2013  . Back strain 05/29/2013  . Unspecified asthma(493.90) 02/27/2013  . Depression with anxiety 02/22/2011  .  Uncontrolled type 2 diabetes mellitus with insulin therapy ([place]) 02/22/2011  . Hyperlipemia 02/22/2011  . HTN (hypertension) 02/22/2011    Rudie Sermons,CHRIS,  PTA 06/28/2016, 5:44 PM  [facility] [address] [place], [place], [address] Phone: [phone]   Fax:  [phone]  Name: Diana Pratt MRN: [id number] Date of Birth: Oct 31, 1946

## 2016-07-02 ENCOUNTER — Ambulatory Visit: Payer: Medicare Other | Admitting: Physical Therapy

## 2016-07-02 DIAGNOSIS — M25511 Pain in right shoulder: Secondary | ICD-10-CM | POA: Diagnosis not present

## 2016-07-02 DIAGNOSIS — M25611 Stiffness of right shoulder, not elsewhere classified: Secondary | ICD-10-CM | POA: Diagnosis not present

## 2016-07-02 NOTE — Therapy (Signed)
[facility] [place], [place], [address] Phone: [phone]   Fax:  [phone]  Physical Therapy Treatment  Patient Details  Name: Diana Pratt MRN: [id number] Date of Birth: 12/04/1945 Referring Provider: Justice Britain  Encounter Date: 07/02/2016      PT End of Session - 07/02/16 1031    Visit Number 3   Number of Visits 24   Date for PT Re-Evaluation 08/22/16   PT Start Time 1032   PT Stop Time 1126   PT Time Calculation (min) 54 min   Activity Tolerance Patient tolerated treatment well   Behavior During Therapy [facility] for tasks assessed/performed      Past Medical History:  Diagnosis Date  . Anxiety   . Asthma   . Back pain   . 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 COPD (chronic obstructive pulmonary disease) ([place])    dr. Kenn File  . Depression   . Diabetes mellitus   . Diabetic neuropathy ([place])   . GERD (gastroesophageal reflux disease)    occ heartburn  . Humerus fracture    right  . Hyperlipemia   . Hypertension   . Interstitial cystitis   . Osteoporosis   . Pneumonia    15 yrs ago  . PONV (postoperative nausea and vomiting)   . Shortness of breath dyspnea     Past Surgical History:  Procedure Laterality Date  . ABDOMINAL HYSTERECTOMY  03/1984   partial  . COLONOSCOPY N/A 07/19/2014   Procedure: COLONOSCOPY;  Surgeon: Danie Binder, MD;  [facility];  Service: Endoscopy;  Laterality: N/A;  9:30  . REVERSE SHOULDER ARTHROPLASTY Right 06/14/2016   Procedure: REVERSE SHOULDER ARTHROPLASTY;  Surgeon: Justice Britain, MD;  Location: [facility];  Service: Orthopedics;  Laterality: Right;    There were no vitals filed for this visit.      Subjective Assessment - 07/02/16 1032    Subjective (P)  I'm in right much pain today.   Pertinent History (P)  DM, OP, COPD   Patient Stated Goals (P)  get over this pain and get back to using this arm   Currently in  Pain? (P)  Yes   Pain Score (P)  6    Pain Location (P)  Shoulder   Pain Orientation (P)  Right   Pain Descriptors / Indicators (P)  Heaviness   Pain Type (P)  Acute pain   Pain Onset (P)  1 to 4 weeks ago   Pain Frequency (P)  Constant   Aggravating Factors  (P)  sleeping in relcliner   Pain Relieving Factors (P)  ice, meds   Effect of Pain on Daily Activities (P)  uanble to use arm                         [facility] Adult PT Treatment/Exercise - 07/02/16 0001      Modalities   Modalities Electrical Stimulation;Vasopneumatic     Electrical Stimulation   Electrical Stimulation Location R shoulder IFC x 15 min 1-10hz    Electrical Stimulation Goals Pain     Vasopneumatic   Number Minutes Vasopneumatic  15 minutes   Vasopnuematic Location  Shoulder  patient reclined against bolster   Vasopneumatic Pressure Low   Vasopneumatic Temperature  34     Manual Therapy   Manual Therapy Passive ROM   Manual therapy comments scar massage to incision and also  AAROM to R elbow and wrist to help with relaxation.   Passive ROM PROM to RT shldr with good support within precautions Flexion to 60 degrees, Er to 20 , and Abd to 45 degrees supine position                PT Education - 07/02/16 1120    Education provided Yes   Education Details scar massage   Person(s) Educated Patient   Methods Explanation;Demonstration   Comprehension Verbalized understanding          PT Short Term Goals - 06/27/16 1208      PT SHORT TERM GOAL #1   Title Improve R shoulder ER to 20 degrees   Time 4   Period Weeks   Status New     PT SHORT TERM GOAL #2   Title decrease R shoulder pain to 5/10.   Time 4   Period Weeks   Status New           PT Long Term Goals - 06/27/16 1208      PT LONG TERM GOAL #1   Title I with HEP   Time 8   Period Weeks   Status New     PT LONG TERM GOAL #2   Title Improved R shoulder ROM to [facility]   Time 8   Period Weeks   Status New      PT LONG TERM GOAL #3   Title Decreased pain in R shoulder to 2-3/10 with functional activities   Time 8   Period Weeks   Status New     PT LONG TERM GOAL #4   Title R shoulder strength 4+/5 or better to improve function (Not to be addressed until okayed by protocol/MD)   Time 8   Period Weeks   Status New               Plan - 07/02/16 1120    Clinical Impression Statement Patient continues to do well with PROM. Somewhat tight in ER. Goals are ongoing.   PT Frequency 3x / week   PT Duration 8 weeks   PT Treatment/Interventions ADLs/Self Care Home Management;Electrical Stimulation;Patient/family education;Neuromuscular re-education;Therapeutic exercise;Manual techniques;Passive range of motion;Vasopneumatic Device   PT Next Visit Plan PROM per protocol (0-6 wks: ER 20 deg, ABD 45, flex 60); NO pendulum/support GH joint in supine; modalities for pain.  MD F/U 07-27-16      Patient will benefit from skilled therapeutic intervention in order to improve the following deficits and impairments:  Decreased range of motion, Impaired UE functional use, Pain, Decreased strength  Visit Diagnosis: Stiffness of right shoulder, not elsewhere classified  Pain in right shoulder     Problem List Patient Active Problem List   Diagnosis Date Noted  . 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 Abdominal pain, acute, bilateral lower quadrant 07/01/2014  . Transaminitis 07/01/2014  . COPD (chronic obstructive pulmonary disease) ([place])   . Asthma   . Interstitial cystitis   . Unspecified vitamin D deficiency 05/29/2013  . Lumbar radiculopathy 05/29/2013  . Back strain 05/29/2013  . Unspecified asthma(493.90) 02/27/2013  . Depression with anxiety 02/22/2011  . Uncontrolled type 2 diabetes mellitus with insulin therapy ([place]) 02/22/2011  . Hyperlipemia 02/22/2011  . HTN (hypertension) 02/22/2011   Madelyn Flavors PT 07/02/2016, 11:24 AM  [facility] [address] [place], [place], [address] Phone: [phone]   Fax:  [phone]  Name: HAYZEL WEATHERWAX MRN: [id number] Date  of Birth: 15-Sep-1946

## 2016-07-04 ENCOUNTER — Telehealth: Payer: Self-pay | Admitting: Family Medicine

## 2016-07-04 DIAGNOSIS — F411 Generalized anxiety disorder: Secondary | ICD-10-CM

## 2016-07-04 MED ORDER — ACCU-CHEK AVIVA PLUS VI STRP: ORAL_STRIP | 1 refills | Status: DC

## 2016-07-04 MED ORDER — ESCITALOPRAM OXALATE 10 MG PO TABS: 20.0000 mg | ORAL_TABLET | Freq: Every day | ORAL | 0 refills | Status: DC

## 2016-07-04 NOTE — Telephone Encounter (Signed)
Reviewed A1c from hospital. It has improved from 9.7% to 7.9%.  Patient continue current antidiabetic meds and TLC.  Patient also needs testing supplies and RF on generic Lexapro - Rx sent in.  She also requests refill on lorazepam - last Rx 06/15/16 #30 = 15 day supply.  This request will have to go to her PCP.

## 2016-07-05 MED ORDER — LORAZEPAM 1 MG PO TABS: ORAL_TABLET | ORAL | 0 refills | Status: DC

## 2016-07-05 NOTE — Telephone Encounter (Signed)
Can refill X 1 refill, will need to be seen for additional refills.   Laroy Apple, MD [facility] 07/05/2016, 7:48 AM

## 2016-07-05 NOTE — Telephone Encounter (Signed)
Rx for Lorazepam called into [facility] per Dr Wendi Snipes

## 2016-07-06 ENCOUNTER — Ambulatory Visit: Payer: Medicare Other | Attending: Orthopedic Surgery | Admitting: Physical Therapy

## 2016-07-06 DIAGNOSIS — M25611 Stiffness of right shoulder, not elsewhere classified: Secondary | ICD-10-CM | POA: Diagnosis not present

## 2016-07-06 DIAGNOSIS — M25511 Pain in right shoulder: Secondary | ICD-10-CM | POA: Diagnosis not present

## 2016-07-06 NOTE — Therapy (Signed)
[facility] [place], [place], [address] Phone: [phone]   Fax:  [phone]  Physical Therapy Treatment  Patient Details  Name: Diana Pratt MRN: [id number] Date of Birth: 1946/10/18 Referring Provider: Justice Britain  Encounter Date: 07/06/2016      PT End of Session - 07/06/16 1034    Visit Number 4   Number of Visits 24   Date for PT Re-Evaluation 08/22/16   PT Start Time 1034   PT Stop Time 1119   PT Time Calculation (min) 45 min   Activity Tolerance Patient tolerated treatment well   Behavior During Therapy [facility] for tasks assessed/performed      Past Medical History:  Diagnosis Date  . Anxiety   . Asthma   . Back pain   . 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 COPD (chronic obstructive pulmonary disease) ([place])    dr. Kenn File  . Depression   . Diabetes mellitus   . Diabetic neuropathy ([place])   . GERD (gastroesophageal reflux disease)    occ heartburn  . Humerus fracture    right  . Hyperlipemia   . Hypertension   . Interstitial cystitis   . Osteoporosis   . Pneumonia    15 yrs ago  . PONV (postoperative nausea and vomiting)   . Shortness of breath dyspnea     Past Surgical History:  Procedure Laterality Date  . ABDOMINAL HYSTERECTOMY  03/1984   partial  . COLONOSCOPY N/A 07/19/2014   Procedure: COLONOSCOPY;  Surgeon: Danie Binder, MD;  [facility];  Service: Endoscopy;  Laterality: N/A;  9:30  . REVERSE SHOULDER ARTHROPLASTY Right 06/14/2016   Procedure: REVERSE SHOULDER ARTHROPLASTY;  Surgeon: Justice Britain, MD;  Location: [facility];  Service: Orthopedics;  Laterality: Right;    There were no vitals filed for this visit.      Subjective Assessment - 07/06/16 1035    Subjective I'm hurting a little more today from the weather. Patient underwent R reverse TSA on 06/14/16. She is presently in a sling. Patient had originally had a humerus fracture.    Patient is accompained by: Family member   Pertinent History DM, OP, COPD   Patient Stated Goals get over this pain and get back to using this arm   Currently in Pain? Yes   Pain Score 6    Pain Location Shoulder   Pain Orientation Right   Pain Descriptors / Indicators Heaviness   Pain Type Acute pain   Pain Onset 1 to 4 weeks ago   Pain Frequency Constant   Aggravating Factors  sleeping in recliner   Pain Relieving Factors ice, meds   Effect of Pain on Daily Activities unable to use arm                         [facility] Adult PT Treatment/Exercise - 07/06/16 0001      Modalities   Modalities Electrical Stimulation;Cryotherapy     Cryotherapy   Number Minutes Cryotherapy 15 Minutes   Cryotherapy Location Shoulder   Type of Cryotherapy Ice pack     Electrical Stimulation   Electrical Stimulation Location R shoulder IFC x 15 min 1-10hz    Electrical Stimulation Goals Pain     Manual Therapy   Manual Therapy Passive ROM   Manual therapy comments scar massage to incision and also AAROM to R elbow and wrist to help  with relaxation.   Passive ROM PROM to RT shldr with good support within precautions Flexion to 60 degrees, Er to 20 , and Abd to 45 degrees supine position                  PT Short Term Goals - 06/27/16 1208      PT SHORT TERM GOAL #1   Title Improve R shoulder ER to 20 degrees   Time 4   Period Weeks   Status New     PT SHORT TERM GOAL #2   Title decrease R shoulder pain to 5/10.   Time 4   Period Weeks   Status New           PT Long Term Goals - 06/27/16 1208      PT LONG TERM GOAL #1   Title I with HEP   Time 8   Period Weeks   Status New     PT LONG TERM GOAL #2   Title Improved R shoulder ROM to [facility]   Time 8   Period Weeks   Status New     PT LONG TERM GOAL #3   Title Decreased pain in R shoulder to 2-3/10 with functional activities   Time 8   Period Weeks   Status New     PT LONG TERM GOAL #4   Title R  shoulder strength 4+/5 or better to improve function (Not to be addressed until okayed by protocol/MD)   Time 8   Period Weeks   Status New               Plan - 07/06/16 1106    Clinical Impression Statement Patient continues to do well with PROM.   PT Treatment/Interventions ADLs/Self Care Home Management;Electrical Stimulation;Patient/family education;Neuromuscular re-education;Therapeutic exercise;Manual techniques;Passive range of motion;Vasopneumatic Device   PT Next Visit Plan PROM per protocol (0-6 wks: ER 20 deg, ABD 45, flex 60); NO pendulum/support GH joint in supine; modalities for pain.  MD F/U 07-27-16   Consulted and Agree with Plan of Care Patient      Patient will benefit from skilled therapeutic intervention in order to improve the following deficits and impairments:  Decreased range of motion, Impaired UE functional use, Pain, Decreased strength  Visit Diagnosis: Stiffness of right shoulder, not elsewhere classified  Pain in right shoulder     Problem List Patient Active Problem List   Diagnosis Date Noted  . 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 Abdominal pain, acute, bilateral lower quadrant 07/01/2014  . Transaminitis 07/01/2014  . COPD (chronic obstructive pulmonary disease) ([place])   . Asthma   . Interstitial cystitis   . Unspecified vitamin D deficiency 05/29/2013  . Lumbar radiculopathy 05/29/2013  . Back strain 05/29/2013  . Unspecified asthma(493.90) 02/27/2013  . Depression with anxiety 02/22/2011  . Uncontrolled type 2 diabetes mellitus with insulin therapy ([place]) 02/22/2011  . Hyperlipemia 02/22/2011  . HTN (hypertension) 02/22/2011    Madelyn Flavors PT 07/06/2016, 11:10 AM  [facility] [address] [place], [place], [address] Phone: [phone]   Fax:  [phone]  Name: Diana Pratt MRN: [id number] Date of Birth: 10-20-1946

## 2016-07-10 ENCOUNTER — Ambulatory Visit: Payer: Medicare Other | Admitting: Physical Therapy

## 2016-07-10 DIAGNOSIS — M25611 Stiffness of right shoulder, not elsewhere classified: Secondary | ICD-10-CM

## 2016-07-10 DIAGNOSIS — M25511 Pain in right shoulder: Secondary | ICD-10-CM | POA: Diagnosis not present

## 2016-07-10 NOTE — Therapy (Signed)
[facility] [place], [place], [address] Phone: [phone]   Fax:  [phone]  Physical Therapy Treatment  Patient Details  Name: Diana Pratt MRN: [id number] Date of Birth: May 04, 1946 Referring Provider: Justice Britain  Encounter Date: 07/10/2016      PT End of Session - 07/10/16 1038    Visit Number 5   Number of Visits 24   Date for PT Re-Evaluation 08/22/16   PT Start Time 1038   PT Stop Time 1126   PT Time Calculation (min) 48 min   Activity Tolerance Patient tolerated treatment well   Behavior During Therapy [facility] for tasks assessed/performed      Past Medical History:  Diagnosis Date  . Anxiety   . Asthma   . Back pain   . 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 COPD (chronic obstructive pulmonary disease) ([place])    dr. Kenn File  . Depression   . Diabetes mellitus   . Diabetic neuropathy ([place])   . GERD (gastroesophageal reflux disease)    occ heartburn  . Humerus fracture    right  . Hyperlipemia   . Hypertension   . Interstitial cystitis   . Osteoporosis   . Pneumonia    15 yrs ago  . PONV (postoperative nausea and vomiting)   . Shortness of breath dyspnea     Past Surgical History:  Procedure Laterality Date  . ABDOMINAL HYSTERECTOMY  03/1984   partial  . COLONOSCOPY N/A 07/19/2014   Procedure: COLONOSCOPY;  Surgeon: Danie Binder, MD;  [facility];  Service: Endoscopy;  Laterality: N/A;  9:30  . REVERSE SHOULDER ARTHROPLASTY Right 06/14/2016   Procedure: REVERSE SHOULDER ARTHROPLASTY;  Surgeon: Justice Britain, MD;  Location: [place];  Service: Orthopedics;  Laterality: Right;    There were no vitals filed for this visit.      Subjective Assessment - 07/10/16 1038    Subjective Patient has no new c/o regarding her shoulder. She has been sick the past few days.   Pertinent History DM, OP, COPD   Patient Stated Goals get over this pain and get  back to using this arm   Currently in Pain? Yes   Pain Score 5    Pain Location Shoulder   Pain Orientation Right   Pain Descriptors / Indicators Heaviness   Pain Type Acute pain   Pain Onset 1 to 4 weeks ago   Pain Frequency Constant   Aggravating Factors  sleeping   Pain Relieving Factors ice, meds   Effect of Pain on Daily Activities unable to use arm                         [facility] Adult PT Treatment/Exercise - 07/10/16 0001      Modalities   Modalities Electrical Stimulation;Cryotherapy     Cryotherapy   Number Minutes Cryotherapy 15 Minutes   Cryotherapy Location Shoulder   Type of Cryotherapy Ice pack     Electrical Stimulation   Electrical Stimulation Location R shoulder IFC x 15 min 1-10hz    Electrical Stimulation Goals Pain     Manual Therapy   Manual Therapy Passive ROM   Manual therapy comments scar massage to incision and also AAROM to R elbow and wrist to help with relaxation.   Passive ROM PROM to RT shldr with good support within precautions Flexion to 60 degrees, Er to 20 ,  and Abd to 45 degrees supine position                  PT Short Term Goals - 06/27/16 1208      PT SHORT TERM GOAL #1   Title Improve R shoulder ER to 20 degrees   Time 4   Period Weeks   Status New     PT SHORT TERM GOAL #2   Title decrease R shoulder pain to 5/10.   Time 4   Period Weeks   Status New           PT Long Term Goals - 06/27/16 1208      PT LONG TERM GOAL #1   Title I with HEP   Time 8   Period Weeks   Status New     PT LONG TERM GOAL #2   Title Improved R shoulder ROM to [facility]   Time 8   Period Weeks   Status New     PT LONG TERM GOAL #3   Title Decreased pain in R shoulder to 2-3/10 with functional activities   Time 8   Period Weeks   Status New     PT LONG TERM GOAL #4   Title R shoulder strength 4+/5 or better to improve function (Not to be addressed until okayed by protocol/MD)   Time 8   Period Weeks   Status  New               Plan - 07/10/16 1222    Clinical Impression Statement Patient doing well with PROM. She was guarding more than usual today, but responded well to oscillations for relaxation.   Rehab Potential Excellent   PT Frequency 3x / week   PT Duration 8 weeks   PT Treatment/Interventions ADLs/Self Care Home Management;Electrical Stimulation;Patient/family education;Neuromuscular re-education;Therapeutic exercise;Manual techniques;Passive range of motion;Vasopneumatic Device   PT Next Visit Plan PROM per protocol (0-6 wks: ER 20 deg, ABD 45, flex 60); NO pendulum/support GH joint in supine; modalities for pain.  MD F/U 07-27-16   PT Home Exercise Plan elbow, wrist ROM and hand squeezes   Consulted and Agree with Plan of Care Patient      Patient will benefit from skilled therapeutic intervention in order to improve the following deficits and impairments:  Decreased range of motion, Impaired UE functional use, Pain, Decreased strength  Visit Diagnosis: Pain in right shoulder  Stiffness of right shoulder, not elsewhere classified     Problem List Patient Active Problem List   Diagnosis Date Noted  . 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 Abdominal pain, acute, bilateral lower quadrant 07/01/2014  . Transaminitis 07/01/2014  . COPD (chronic obstructive pulmonary disease) ([place])   . Asthma   . Interstitial cystitis   . Unspecified vitamin D deficiency 05/29/2013  . Lumbar radiculopathy 05/29/2013  . Back strain 05/29/2013  . Unspecified asthma(493.90) 02/27/2013  . Depression with anxiety 02/22/2011  . Uncontrolled type 2 diabetes mellitus with insulin therapy ([place]) 02/22/2011  . Hyperlipemia 02/22/2011  . HTN (hypertension) 02/22/2011    Madelyn Flavors PT 07/10/2016, 12:51 PM  [facility] [address] [place], [place], [address] Phone: [phone]   Fax:  [phone]  Name: Diana Pratt MRN:  [id number] Date of Birth: 04-07-1946

## 2016-07-12 ENCOUNTER — Ambulatory Visit: Payer: Medicare Other | Admitting: Physical Therapy

## 2016-07-12 ENCOUNTER — Encounter: Payer: Self-pay | Admitting: Physical Therapy

## 2016-07-12 DIAGNOSIS — M25511 Pain in right shoulder: Secondary | ICD-10-CM

## 2016-07-12 DIAGNOSIS — M25611 Stiffness of right shoulder, not elsewhere classified: Secondary | ICD-10-CM | POA: Diagnosis not present

## 2016-07-12 NOTE — Therapy (Signed)
[facility] [place], [place], [address] Phone: [phone]   Fax:  [phone]  Physical Therapy Treatment  Patient Details  Name: Diana Pratt MRN: [id number] Date of Birth: 22-Jan-1946 Referring Provider: Justice Britain  Encounter Date: 07/12/2016      PT End of Session - 07/12/16 1519    Visit Number 6   Number of Visits 24   Date for PT Re-Evaluation 08/22/16   PT Start Time 1519   PT Stop Time 1604   PT Time Calculation (min) 45 min   Activity Tolerance Patient tolerated treatment well   Behavior During Therapy [facility] for tasks assessed/performed      Past Medical History:  Diagnosis Date  . Anxiety   . Asthma   . Back pain   . 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 COPD (chronic obstructive pulmonary disease) ([place])    dr. Kenn File  . Depression   . Diabetes mellitus   . Diabetic neuropathy ([place])   . GERD (gastroesophageal reflux disease)    occ heartburn  . Humerus fracture    right  . Hyperlipemia   . Hypertension   . Interstitial cystitis   . Osteoporosis   . Pneumonia    15 yrs ago  . PONV (postoperative nausea and vomiting)   . Shortness of breath dyspnea     Past Surgical History:  Procedure Laterality Date  . ABDOMINAL HYSTERECTOMY  03/1984   partial  . COLONOSCOPY N/A 07/19/2014   Procedure: COLONOSCOPY;  Surgeon: Danie Binder, MD;  [facility];  Service: Endoscopy;  Laterality: N/A;  9:30  . REVERSE SHOULDER ARTHROPLASTY Right 06/14/2016   Procedure: REVERSE SHOULDER ARTHROPLASTY;  Surgeon: Justice Britain, MD;  Location: [place];  Service: Orthopedics;  Laterality: Right;    There were no vitals filed for this visit.      Subjective Assessment - 07/12/16 1518    Subjective Reports that she still has pain.   Pertinent History DM, OP, COPD   Patient Stated Goals get over this pain and get back to using this arm   Currently in Pain? Yes   Pain Score 5    Pain Location Shoulder   Pain Orientation Right   Pain Descriptors / Indicators Tingling;Heaviness   Pain Type Acute pain   Pain Onset 1 to 4 weeks ago            [facility] PT Assessment - 07/12/16 0001      Assessment   Medical Diagnosis s/p R Reverse TSA   Onset Date/Surgical Date 06/14/16   Hand Dominance Right   Next MD Visit 07/27/16   Prior Therapy no     Precautions   Precautions Shoulder   Precaution Comments SEE PROTOCOL   Required Braces or Orthoses Sling                     [facility] Adult PT Treatment/Exercise - 07/12/16 0001      Modalities   Modalities Electrical Stimulation;Cryotherapy     Cryotherapy   Number Minutes Cryotherapy 15 Minutes   Cryotherapy Location Shoulder   Type of Cryotherapy Ice pack     Electrical Stimulation   Electrical Stimulation Location R shoulder   Electrical Stimulation Action IFC   Electrical Stimulation Parameters 1-10 hz x15 min   Electrical Stimulation Goals Pain     Manual Therapy   Manual Therapy Passive ROM  Passive ROM PROM of R shoulder into flex/ER/IR with gentle holds at end range with oscillations for relaxation                  PT Short Term Goals - 06/27/16 1208      PT SHORT TERM GOAL #1   Title Improve R shoulder ER to 20 degrees   Time 4   Period Weeks   Status New     PT SHORT TERM GOAL #2   Title decrease R shoulder pain to 5/10.   Time 4   Period Weeks   Status New           PT Long Term Goals - 06/27/16 1208      PT LONG TERM GOAL #1   Title I with HEP   Time 8   Period Weeks   Status New     PT LONG TERM GOAL #2   Title Improved R shoulder ROM to [facility]   Time 8   Period Weeks   Status New     PT LONG TERM GOAL #3   Title Decreased pain in R shoulder to 2-3/10 with functional activities   Time 8   Period Weeks   Status New     PT LONG TERM GOAL #4   Title R shoulder strength 4+/5 or better to improve function (Not to be addressed until  okayed by protocol/MD)   Time 8   Period Weeks   Status New               Plan - 07/12/16 1551    Clinical Impression Statement Patient limited slightly due to R shoulder pain today. Gentle PROM completed secondary to pain and facial grimacing observed. Firm end feels noted with PROM of R shoulder. Oscillations required to R shoulder to promote relaxation. Goals remain on-going secondary to protocol limitations and pain. Normal modalities response noted following removal of the modalities. Patient compliant with sling use at this time.   Rehab Potential Excellent   PT Frequency 3x / week   PT Duration 8 weeks   PT Treatment/Interventions ADLs/Self Care Home Management;Electrical Stimulation;Patient/family education;Neuromuscular re-education;Therapeutic exercise;Manual techniques;Passive range of motion;Vasopneumatic Device   PT Next Visit Plan PROM per protocol (0-6 wks: ER 20 deg, ABD 45, flex 60); NO pendulum/support GH joint in supine; modalities for pain.  MD F/U 07-27-16   PT Home Exercise Plan elbow, wrist ROM and hand squeezes   Consulted and Agree with Plan of Care Patient      Patient will benefit from skilled therapeutic intervention in order to improve the following deficits and impairments:  Decreased range of motion, Impaired UE functional use, Pain, Decreased strength  Visit Diagnosis: Pain in right shoulder  Stiffness of right shoulder, not elsewhere classified     Problem List Patient Active Problem List   Diagnosis Date Noted  . 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 Abdominal pain, acute, bilateral lower quadrant 07/01/2014  . Transaminitis 07/01/2014  . COPD (chronic obstructive pulmonary disease) ([place])   . Asthma   . Interstitial cystitis   . Unspecified vitamin D deficiency 05/29/2013  . Lumbar radiculopathy 05/29/2013  . Back strain 05/29/2013  . Unspecified asthma(493.90) 02/27/2013  . Depression with anxiety 02/22/2011  .  Uncontrolled type 2 diabetes mellitus with insulin therapy ([place]) 02/22/2011  . Hyperlipemia 02/22/2011  . HTN (hypertension) 02/22/2011    Wynelle Fanny, PTA 07/12/2016, 4:11 PM  [facility] [place]  [place], [place], [address] Phone: [phone]   Fax:  [phone]  Name: Diana Pratt MRN: [id number] Date of Birth: 04-Mar-1946

## 2016-07-16 ENCOUNTER — Ambulatory Visit: Payer: Medicare Other | Admitting: Physical Therapy

## 2016-07-16 ENCOUNTER — Encounter: Payer: Self-pay | Admitting: Physical Therapy

## 2016-07-16 DIAGNOSIS — M25611 Stiffness of right shoulder, not elsewhere classified: Secondary | ICD-10-CM

## 2016-07-16 DIAGNOSIS — M25511 Pain in right shoulder: Secondary | ICD-10-CM | POA: Diagnosis not present

## 2016-07-16 NOTE — Therapy (Signed)
[facility] [place], [place], [address] Phone: [phone]   Fax:  [phone]  Physical Therapy Treatment  Patient Details  Name: Diana Pratt MRN: [id number] Date of Birth: 1946-05-28 Referring Provider: Justice Britain  Encounter Date: 07/16/2016      PT End of Session - 07/16/16 1039    Visit Number 7   Number of Visits 24   Date for PT Re-Evaluation 08/22/16   PT Start Time 1031   PT Stop Time 1114   PT Time Calculation (min) 43 min   Activity Tolerance Patient tolerated treatment well   Behavior During Therapy [facility] for tasks assessed/performed      Past Medical History:  Diagnosis Date  . Anxiety   . Asthma   . Back pain   . 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 COPD (chronic obstructive pulmonary disease) ([place])    dr. Kenn File  . Depression   . Diabetes mellitus   . Diabetic neuropathy ([place])   . GERD (gastroesophageal reflux disease)    occ heartburn  . Humerus fracture    right  . Hyperlipemia   . Hypertension   . Interstitial cystitis   . Osteoporosis   . Pneumonia    15 yrs ago  . PONV (postoperative nausea and vomiting)   . Shortness of breath dyspnea     Past Surgical History:  Procedure Laterality Date  . ABDOMINAL HYSTERECTOMY  03/1984   partial  . COLONOSCOPY N/A 07/19/2014   Procedure: COLONOSCOPY;  Surgeon: Danie Binder, MD;  [facility];  Service: Endoscopy;  Laterality: N/A;  9:30  . REVERSE SHOULDER ARTHROPLASTY Right 06/14/2016   Procedure: REVERSE SHOULDER ARTHROPLASTY;  Surgeon: Justice Britain, MD;  Location: [place];  Service: Orthopedics;  Laterality: Right;    There were no vitals filed for this visit.      Subjective Assessment - 07/16/16 1036    Subjective Patient reported doing ok after last treatment and still sore in shoulder today   Pertinent History DM, OP, COPD   Patient Stated Goals get over this pain and get  back to using this arm   Currently in Pain? Yes   Pain Score 5    Pain Location Shoulder   Pain Orientation Right   Pain Descriptors / Indicators Sore;Aching   Pain Type Acute pain   Pain Onset 1 to 4 weeks ago   Pain Frequency Constant   Aggravating Factors  movement and sleeping   Pain Relieving Factors meds and ice                         [facility] Adult PT Treatment/Exercise - 07/16/16 0001      Cryotherapy   Number Minutes Cryotherapy 15 Minutes   Cryotherapy Location Shoulder   Type of Cryotherapy Ice pack     Electrical Stimulation   Electrical Stimulation Location R shoulder   Electrical Stimulation Action IFC   Electrical Stimulation Parameters 1-10hz  x27min   Electrical Stimulation Goals Pain     Manual Therapy   Manual Therapy Passive ROM   Passive ROM PROM of R shoulder into flex/ER per MD protocol                  PT Short Term Goals - 07/16/16 1054      PT SHORT TERM GOAL #1   Title Improve R shoulder ER to 20  degrees   Time 4   Period Weeks   Status Achieved  PROM 20 degrees 07/16/16     PT SHORT TERM GOAL #2   Title decrease R shoulder pain to 5/10.   Time 4   Period Weeks   Status On-going  pain 5/10 and more at times 07/16/16           PT Long Term Goals - 07/16/16 1055      PT LONG TERM GOAL #1   Title I with HEP   Time 8   Period Weeks   Status On-going     PT LONG TERM GOAL #2   Title Improved R shoulder ROM to [facility]   Time 8   Period Weeks   Status On-going     PT LONG TERM GOAL #3   Title Decreased pain in R shoulder to 2-3/10 with functional activities   Time 8   Period Weeks   Status On-going     PT LONG TERM GOAL #4   Title R shoulder strength 4+/5 or better to improve function (Not to be addressed until okayed by protocol/MD)   Time 8   Period Weeks   Status On-going               Plan - 07/16/16 1041    Clinical Impression Statement Patient progressing with ROM within MD protocol at  this time to 20 degrees ER and 60 degrees flexion. Patient continues to have pain and soreness in right shoulder. Today patient able to relax with verbal cues and able to tolerate PROM with no increased pain. Patient goals ongoing due to protocol limitations.   Rehab Potential Excellent   Clinical Impairments Affecting Rehab Potential 06/14/16 surgury/ current 4 week 07/12/16   PT Frequency 3x / week   PT Duration 8 weeks   PT Treatment/Interventions ADLs/Self Care Home Management;Electrical Stimulation;Patient/family education;Neuromuscular re-education;Therapeutic exercise;Manual techniques;Passive range of motion;Vasopneumatic Device   PT Next Visit Plan PROM per protocol (0-6 wks: ER 20 deg, ABD 45, flex 60); NO pendulum/support GH joint in supine; modalities for pain.  MD. Onnie Graham F/U 07-27-16   Consulted and Agree with Plan of Care Patient      Patient will benefit from skilled therapeutic intervention in order to improve the following deficits and impairments:  Decreased range of motion, Impaired UE functional use, Pain, Decreased strength  Visit Diagnosis: Pain in right shoulder  Stiffness of right shoulder, not elsewhere classified     Problem List Patient Active Problem List   Diagnosis Date Noted  . 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 Abdominal pain, acute, bilateral lower quadrant 07/01/2014  . Transaminitis 07/01/2014  . COPD (chronic obstructive pulmonary disease) ([place])   . Asthma   . Interstitial cystitis   . Unspecified vitamin D deficiency 05/29/2013  . Lumbar radiculopathy 05/29/2013  . Back strain 05/29/2013  . Unspecified asthma(493.90) 02/27/2013  . Depression with anxiety 02/22/2011  . Uncontrolled type 2 diabetes mellitus with insulin therapy ([place]) 02/22/2011  . Hyperlipemia 02/22/2011  . HTN (hypertension) 02/22/2011    Dylana Shaw P, PTA 07/16/2016, 11:53 AM  [facility] [place], [place], [address] Phone: [phone]   Fax:  [phone]  Name: Diana Pratt MRN: [id number] Date of Birth: 17-Mar-1946

## 2016-07-19 ENCOUNTER — Encounter: Payer: Self-pay | Admitting: Physical Therapy

## 2016-07-19 ENCOUNTER — Ambulatory Visit: Payer: Medicare Other | Admitting: Physical Therapy

## 2016-07-19 DIAGNOSIS — M25511 Pain in right shoulder: Secondary | ICD-10-CM

## 2016-07-19 DIAGNOSIS — M25611 Stiffness of right shoulder, not elsewhere classified: Secondary | ICD-10-CM | POA: Diagnosis not present

## 2016-07-19 NOTE — Therapy (Signed)
[facility] [place], [place], [address] Phone: [phone]   Fax:  [phone]  Physical Therapy Treatment  Patient Details  Name: Diana Pratt MRN: [id number] Date of Birth: 1946/10/06 Referring Provider: Justice Britain  Encounter Date: 07/19/2016      PT End of Session - 07/19/16 1047    Visit Number 8   Number of Visits 24   Date for PT Re-Evaluation 08/22/16   PT Start Time [id number]   PT Stop Time 1124   PT Time Calculation (min) 44 min   Activity Tolerance Patient tolerated treatment well   Behavior During Therapy [facility] for tasks assessed/performed      Past Medical History:  Diagnosis Date  . Anxiety   . Asthma   . Back pain   . 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 COPD (chronic obstructive pulmonary disease) ([place])    dr. Kenn File  . Depression   . Diabetes mellitus   . Diabetic neuropathy ([place])   . GERD (gastroesophageal reflux disease)    occ heartburn  . Humerus fracture    right  . Hyperlipemia   . Hypertension   . Interstitial cystitis   . Osteoporosis   . Pneumonia    15 yrs ago  . PONV (postoperative nausea and vomiting)   . Shortness of breath dyspnea     Past Surgical History:  Procedure Laterality Date  . ABDOMINAL HYSTERECTOMY  03/1984   partial  . COLONOSCOPY N/A 07/19/2014   Procedure: COLONOSCOPY;  Surgeon: Danie Binder, MD;  [facility];  Service: Endoscopy;  Laterality: N/A;  9:30  . REVERSE SHOULDER ARTHROPLASTY Right 06/14/2016   Procedure: REVERSE SHOULDER ARTHROPLASTY;  Surgeon: Justice Britain, MD;  Location: [facility];  Service: Orthopedics;  Laterality: Right;    There were no vitals filed for this visit.      Subjective Assessment - 07/19/16 1043    Subjective patient reports ongoing soreness in shoulder   Pertinent History DM, OP, COPD   Patient Stated Goals get over this pain and get back to using this arm   Currently in  Pain? Yes   Pain Score 5    Pain Location Shoulder   Pain Orientation Right   Pain Descriptors / Indicators Sore   Pain Type Acute pain   Pain Onset 1 to 4 weeks ago   Pain Frequency Constant   Aggravating Factors  movement   Pain Relieving Factors meds and ice                         [facility] Adult PT Treatment/Exercise - 07/19/16 0001      Cryotherapy   Number Minutes Cryotherapy 15 Minutes   Cryotherapy Location Shoulder   Type of Cryotherapy Ice pack     Electrical Stimulation   Electrical Stimulation Location R shoulder   Electrical Stimulation Action IFC    Electrical Stimulation Parameters 1-10hz  x8min   Electrical Stimulation Goals Pain     Manual Therapy   Manual Therapy Passive ROM   Passive ROM PROM of R shoulder into flex/ER per MD protocol                  PT Short Term Goals - 07/16/16 1054      PT SHORT TERM GOAL #1   Title Improve R shoulder ER to 20 degrees   Time 4   Period  Weeks   Status Achieved  PROM 20 degrees 07/16/16     PT SHORT TERM GOAL #2   Title decrease R shoulder pain to 5/10.   Time 4   Period Weeks   Status On-going  pain 5/10 and more at times 07/16/16           PT Long Term Goals - 07/16/16 1055      PT LONG TERM GOAL #1   Title I with HEP   Time 8   Period Weeks   Status On-going     PT LONG TERM GOAL #2   Title Improved R shoulder ROM to [facility]   Time 8   Period Weeks   Status On-going     PT LONG TERM GOAL #3   Title Decreased pain in R shoulder to 2-3/10 with functional activities   Time 8   Period Weeks   Status On-going     PT LONG TERM GOAL #4   Title R shoulder strength 4+/5 or better to improve function (Not to be addressed until okayed by protocol/MD)   Time 8   Period Weeks   Status On-going               Plan - 07/19/16 1052    Clinical Impression Statement Patient continues to progress and tolerate treatment well. Patient PROM is within MD protocol limitations at  ER 20 degrees and Flexion 60 degrees. Patient pain level is ongoing at 5/10. Patient requires cues to relax due to guarding with ROM. Patient goals ongoing due to protocol limitations.   Rehab Potential Excellent   Clinical Impairments Affecting Rehab Potential 06/14/16 surgury/ current 4 week 07/12/16   PT Frequency 3x / week   PT Duration 8 weeks   PT Treatment/Interventions ADLs/Self Care Home Management;Electrical Stimulation;Patient/family education;Neuromuscular re-education;Therapeutic exercise;Manual techniques;Passive range of motion;Vasopneumatic Device   PT Next Visit Plan PROM per protocol (0-6 wks: ER 20 deg, ABD 45, flex 60); NO pendulum/support GH joint in supine; modalities for pain.  MD. Onnie Graham F/U 07-27-16   Consulted and Agree with Plan of Care Patient      Patient will benefit from skilled therapeutic intervention in order to improve the following deficits and impairments:  Decreased range of motion, Impaired UE functional use, Pain, Decreased strength  Visit Diagnosis: Pain in right shoulder  Stiffness of right shoulder, not elsewhere classified     Problem List Patient Active Problem List   Diagnosis Date Noted  . 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 Abdominal pain, acute, bilateral lower quadrant 07/01/2014  . Transaminitis 07/01/2014  . COPD (chronic obstructive pulmonary disease) ([place])   . Asthma   . Interstitial cystitis   . Unspecified vitamin D deficiency 05/29/2013  . Lumbar radiculopathy 05/29/2013  . Back strain 05/29/2013  . Unspecified asthma(493.90) 02/27/2013  . Depression with anxiety 02/22/2011  . Uncontrolled type 2 diabetes mellitus with insulin therapy ([place]) 02/22/2011  . Hyperlipemia 02/22/2011  . HTN (hypertension) 02/22/2011    Ovie Cornelio P, PTA 07/19/2016, 11:31 AM  [facility] [place], [place], [address] Phone: [phone]   Fax:   [phone]  Name: Diana Pratt MRN: [id number] Date of Birth: 1946/03/01

## 2016-07-23 ENCOUNTER — Ambulatory Visit: Payer: Medicare Other | Admitting: Physical Therapy

## 2016-07-23 DIAGNOSIS — M25511 Pain in right shoulder: Secondary | ICD-10-CM | POA: Diagnosis not present

## 2016-07-23 DIAGNOSIS — M25611 Stiffness of right shoulder, not elsewhere classified: Secondary | ICD-10-CM | POA: Diagnosis not present

## 2016-07-23 NOTE — Therapy (Signed)
[facility] [place], [place], [address] Phone: [phone]   Fax:  [phone]  Physical Therapy Treatment  Patient Details  Name: Diana Pratt MRN: [id number] Date of Birth: 1946-10-03 Referring Provider: Justice Britain  Encounter Date: 07/23/2016      PT End of Session - 07/23/16 1233    Visit Number 9   Number of Visits 24   Date for PT Re-Evaluation 08/22/16   PT Start Time 1120   PT Stop Time 1212   PT Time Calculation (min) 52 min   Activity Tolerance Patient tolerated treatment well   Behavior During Therapy [facility] for tasks assessed/performed      Past Medical History:  Diagnosis Date  . Anxiety   . Asthma   . Back pain   . 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 COPD (chronic obstructive pulmonary disease) ([place])    dr. Kenn File  . Depression   . Diabetes mellitus   . Diabetic neuropathy ([place])   . GERD (gastroesophageal reflux disease)    occ heartburn  . Humerus fracture    right  . Hyperlipemia   . Hypertension   . Interstitial cystitis   . Osteoporosis   . Pneumonia    15 yrs ago  . PONV (postoperative nausea and vomiting)   . Shortness of breath dyspnea     Past Surgical History:  Procedure Laterality Date  . ABDOMINAL HYSTERECTOMY  03/1984   partial  . COLONOSCOPY N/A 07/19/2014   Procedure: COLONOSCOPY;  Surgeon: Danie Binder, MD;  [facility];  Service: Endoscopy;  Laterality: N/A;  9:30  . REVERSE SHOULDER ARTHROPLASTY Right 06/14/2016   Procedure: REVERSE SHOULDER ARTHROPLASTY;  Surgeon: Justice Britain, MD;  Location: [place];  Service: Orthopedics;  Laterality: Right;    There were no vitals filed for this visit.      Subjective Assessment - 07/23/16 1238    Subjective My shoulder stays sore.   Patient Stated Goals get over this pain and get back to using this arm   Pain Score 5    Pain Location Shoulder   Pain Orientation Right    Pain Descriptors / Indicators Sore   Pain Type Acute pain   Pain Onset 1 to 4 weeks ago                         [facility] Adult PT Treatment/Exercise - 07/23/16 0001      Modalities   Modalities Electrical Stimulation;Moist Heat     Moist Heat Therapy   Number Minutes Moist Heat 20 Minutes   Moist Heat Location --  RT SHLD.     Acupuncturist Location RT SHLD.   Electrical Stimulation Action IFC x 20 minutes.   Electrical Stimulation Goals Pain     Manual Therapy   Passive ROM RT SHLD PAROM in supine into flexion and ER x 23 minutes.                  PT Short Term Goals - 07/16/16 1054      PT SHORT TERM GOAL #1   Title Improve R shoulder ER to 20 degrees   Time 4   Period Weeks   Status Achieved  PROM 20 degrees 07/16/16     PT SHORT TERM GOAL #2   Title decrease R shoulder pain to 5/10.   Time  4   Period Weeks   Status On-going  pain 5/10 and more at times 07/16/16           PT Long Term Goals - 07/16/16 1055      PT LONG TERM GOAL #1   Title I with HEP   Time 8   Period Weeks   Status On-going     PT LONG TERM GOAL #2   Title Improved R shoulder ROM to [facility]   Time 8   Period Weeks   Status On-going     PT LONG TERM GOAL #3   Title Decreased pain in R shoulder to 2-3/10 with functional activities   Time 8   Period Weeks   Status On-going     PT LONG TERM GOAL #4   Title R shoulder strength 4+/5 or better to improve function (Not to be addressed until okayed by protocol/MD)   Time 8   Period Weeks   Status On-going             Patient will benefit from skilled therapeutic intervention in order to improve the following deficits and impairments:  Decreased range of motion, Impaired UE functional use, Pain, Decreased strength  Visit Diagnosis: Pain in right shoulder  Stiffness of right shoulder, not elsewhere classified     Problem List Patient Active Problem List    Diagnosis Date Noted  . 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 Abdominal pain, acute, bilateral lower quadrant 07/01/2014  . Transaminitis 07/01/2014  . COPD (chronic obstructive pulmonary disease) ([place])   . Asthma   . Interstitial cystitis   . Unspecified vitamin D deficiency 05/29/2013  . Lumbar radiculopathy 05/29/2013  . Back strain 05/29/2013  . Unspecified asthma(493.90) 02/27/2013  . Depression with anxiety 02/22/2011  . Uncontrolled type 2 diabetes mellitus with insulin therapy ([place]) 02/22/2011  . Hyperlipemia 02/22/2011  . HTN (hypertension) 02/22/2011    Kensleigh Gates, [place] MPT 07/23/2016, 12:46 PM  [facility] [address] [place], [place], [address] Phone: [phone]   Fax:  [phone]  Name: Diana Pratt MRN: [id number] Date of Birth: Dec 16, 1945

## 2016-07-26 ENCOUNTER — Ambulatory Visit: Payer: Medicare Other | Admitting: *Deleted

## 2016-07-26 DIAGNOSIS — M25511 Pain in right shoulder: Secondary | ICD-10-CM

## 2016-07-26 DIAGNOSIS — M25611 Stiffness of right shoulder, not elsewhere classified: Secondary | ICD-10-CM | POA: Diagnosis not present

## 2016-07-26 NOTE — Therapy (Signed)
[facility] [place], [place], [address] Phone: [phone]   Fax:  [phone]  Physical Therapy Treatment  Patient Details  Name: Diana Pratt MRN: [id number] Date of Birth: Mar 16, 1946 Referring Provider: Justice Britain  Encounter Date: 07/26/2016      PT End of Session - 07/26/16 1401    Visit Number 10   Number of Visits 24   Date for PT Re-Evaluation 08/22/16   PT Start Time 1115   PT Stop Time 6761   PT Time Calculation (min) 50 min      Past Medical History:  Diagnosis Date  . Anxiety   . Asthma   . Back pain   . 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 COPD (chronic obstructive pulmonary disease) ([place])    dr. Kenn File  . Depression   . Diabetes mellitus   . Diabetic neuropathy ([place])   . GERD (gastroesophageal reflux disease)    occ heartburn  . Humerus fracture    right  . Hyperlipemia   . Hypertension   . Interstitial cystitis   . Osteoporosis   . Pneumonia    15 yrs ago  . PONV (postoperative nausea and vomiting)   . Shortness of breath dyspnea     Past Surgical History:  Procedure Laterality Date  . ABDOMINAL HYSTERECTOMY  03/1984   partial  . COLONOSCOPY N/A 07/19/2014   Procedure: COLONOSCOPY;  Surgeon: Danie Binder, MD;  [facility];  Service: Endoscopy;  Laterality: N/A;  9:30  . REVERSE SHOULDER ARTHROPLASTY Right 06/14/2016   Procedure: REVERSE SHOULDER ARTHROPLASTY;  Surgeon: Justice Britain, MD;  Location: [place];  Service: Orthopedics;  Laterality: Right;    There were no vitals filed for this visit.      Subjective Assessment - 07/26/16 1117    Subjective My RT shoulder stays sore.   Pertinent History DM, OP, COPD   Limitations Lifting   Pain Location Shoulder   Pain Orientation Right   Pain Type Acute pain   Pain Onset 1 to 4 weeks ago                         [facility] Adult PT Treatment/Exercise - 07/26/16  0001      Modalities   Modalities Electrical Stimulation;Moist Heat     Moist Heat Therapy   Number Minutes Moist Heat 15 Minutes   Moist Heat Location Shoulder     Electrical Stimulation   Electrical Stimulation Location RT SHLD.   Electrical Stimulation Action IFC x 15 mins 80-_0    Electrical Stimulation Goals Pain     Manual Therapy   Manual Therapy Passive ROM   Passive ROM RT SHLD PAROM in supine into flexion, ABD ,  and ER  within protocol limits                  PT Short Term Goals - 07/16/16 1054      PT SHORT TERM GOAL #1   Title Improve R shoulder ER to 20 degrees   Time 4   Period Weeks   Status Achieved  PROM 20 degrees 07/16/16     PT SHORT TERM GOAL #2   Title decrease R shoulder pain to 5/10.   Time 4   Period Weeks   Status On-going  pain 5/10 and more at times 07/16/16  PT Long Term Goals - 07/16/16 1055      PT LONG TERM GOAL #1   Title I with HEP   Time 8   Period Weeks   Status On-going     PT LONG TERM GOAL #2   Title Improved R shoulder ROM to [facility]   Time 8   Period Weeks   Status On-going     PT LONG TERM GOAL #3   Title Decreased pain in R shoulder to 2-3/10 with functional activities   Time 8   Period Weeks   Status On-going     PT LONG TERM GOAL #4   Title R shoulder strength 4+/5 or better to improve function (Not to be addressed until okayed by protocol/MD)   Time 8   Period Weeks   Status On-going               Plan - 08/19/16 1347    Clinical Impression Statement Pt arrived to clinic wearing sling still on RT UE. She  will see MD tomorrow for 6 week F/U. Pt did great with PROM and has met protocol limits for ROM.  We will continue as per MD recommendations   Clinical Impairments Affecting Rehab Potential 06/14/16 surgury/ current 4 week 07/12/16   PT Frequency 3x / week   PT Duration 8 weeks   PT Treatment/Interventions ADLs/Self Care Home Management;Electrical Stimulation;Patient/family  education;Neuromuscular re-education;Therapeutic exercise;Manual techniques;Passive range of motion;Vasopneumatic Device   PT Next Visit Plan PROM per protocol (0-6 wks: ER 20 deg, ABD 45, flex 60); NO pendulum/support GH joint in supine; modalities for pain.  MD. Onnie Graham F/U 07-27-16   Send MD Note   PT Home Exercise Plan elbow, wrist ROM and hand squeezes   Consulted and Agree with Plan of Care Patient      Patient will benefit from skilled therapeutic intervention in order to improve the following deficits and impairments:  Decreased range of motion, Impaired UE functional use, Pain, Decreased strength  Visit Diagnosis: Pain in right shoulder  Stiffness of right shoulder, not elsewhere classified       G-Codes - 08-19-2016 1402    Functional Assessment Tool Used FOTO 78% limited 10th visit Gcode      Problem List Patient Active Problem List   Diagnosis Date Noted  . 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 Abdominal pain, acute, bilateral lower quadrant 07/01/2014  . Transaminitis 07/01/2014  . COPD (chronic obstructive pulmonary disease) ([place])   . Asthma   . Interstitial cystitis   . Unspecified vitamin D deficiency 05/29/2013  . Lumbar radiculopathy 05/29/2013  . Back strain 05/29/2013  . Unspecified asthma(493.90) 02/27/2013  . Depression with anxiety 02/22/2011  . Uncontrolled type 2 diabetes mellitus with insulin therapy ([place]) 02/22/2011  . Hyperlipemia 02/22/2011  . HTN (hypertension) 02/22/2011    Jahkai Yandell,CHRIS, PTA 2016-08-19, 2:20 PM  [facility] [place], [place], [address] Phone: [phone]   Fax:  [phone]  Name: Diana Pratt MRN: [id number] Date of Birth: 09/08/1946

## 2016-07-30 ENCOUNTER — Encounter: Payer: Self-pay | Admitting: Physical Therapy

## 2016-07-30 ENCOUNTER — Ambulatory Visit: Payer: Medicare Other | Admitting: Physical Therapy

## 2016-07-30 DIAGNOSIS — M25611 Stiffness of right shoulder, not elsewhere classified: Secondary | ICD-10-CM

## 2016-07-30 DIAGNOSIS — M25511 Pain in right shoulder: Secondary | ICD-10-CM

## 2016-07-30 NOTE — Therapy (Signed)
[facility] [place], [place], [address] Phone: [phone]   Fax:  [phone]  Physical Therapy Treatment  Patient Details  Name: Diana Pratt MRN: [id number] Date of Birth: 1946-07-26 Referring Provider: Justice Britain  Encounter Date: 07/30/2016      PT End of Session - 07/30/16 1149    Visit Number 11   Number of Visits 24   Date for PT Re-Evaluation 09/07/16  new order   PT Start Time 1116   PT Stop Time 1201   PT Time Calculation (min) 45 min   Activity Tolerance Patient tolerated treatment well   Behavior During Therapy [facility] for tasks assessed/performed      Past Medical History:  Diagnosis Date  . Anxiety   . Asthma   . Back pain   . 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 COPD (chronic obstructive pulmonary disease) ([place])    dr. Kenn File  . Depression   . Diabetes mellitus   . Diabetic neuropathy ([place])   . GERD (gastroesophageal reflux disease)    occ heartburn  . Humerus fracture    right  . Hyperlipemia   . Hypertension   . Interstitial cystitis   . Osteoporosis   . Pneumonia    15 yrs ago  . PONV (postoperative nausea and vomiting)   . Shortness of breath dyspnea     Past Surgical History:  Procedure Laterality Date  . ABDOMINAL HYSTERECTOMY  03/1984   partial  . COLONOSCOPY N/A 07/19/2014   Procedure: COLONOSCOPY;  Surgeon: Danie Binder, MD;  [facility];  Service: Endoscopy;  Laterality: N/A;  9:30  . REVERSE SHOULDER ARTHROPLASTY Right 06/14/2016   Procedure: REVERSE SHOULDER ARTHROPLASTY;  Surgeon: Justice Britain, MD;  Location: [place];  Service: Orthopedics;  Laterality: Right;    There were no vitals filed for this visit.      Subjective Assessment - 07/30/16 1128    Subjective Patient went to MD and is progressing well, patient has some ongoing soreness   Patient is accompained by: Family member   Pertinent History DM, OP, COPD    Limitations Lifting   Patient Stated Goals get over this pain and get back to using this arm   Currently in Pain? Yes   Pain Score 4    Pain Location Shoulder   Pain Orientation Right   Pain Descriptors / Indicators Sore   Pain Type Surgical pain   Pain Onset 1 to 4 weeks ago   Pain Frequency Constant   Aggravating Factors  movement   Pain Relieving Factors meds and ice                         [facility] Adult PT Treatment/Exercise - 07/30/16 0001      Moist Heat Therapy   Number Minutes Moist Heat 15 Minutes   Moist Heat Location Shoulder     Electrical Stimulation   Electrical Stimulation Location right shoulder   Electrical Stimulation Action IFC   Electrical Stimulation Parameters 1-10hz  x19min   Electrical Stimulation Goals Pain     Manual Therapy   Manual Therapy Passive ROM   Passive ROM RT SHLD PAROM in supine into flexion, ABD ,  and ER  within protocol limits                  PT Short Term Goals - 07/16/16 1054  PT SHORT TERM GOAL #1   Title Improve R shoulder ER to 20 degrees   Time 4   Period Weeks   Status Achieved  PROM 20 degrees 07/16/16     PT SHORT TERM GOAL #2   Title decrease R shoulder pain to 5/10.   Time 4   Period Weeks   Status On-going  pain 5/10 and more at times 07/16/16           PT Long Term Goals - 07/16/16 1055      PT LONG TERM GOAL #1   Title I with HEP   Time 8   Period Weeks   Status On-going     PT LONG TERM GOAL #2   Title Improved R shoulder ROM to [facility]   Time 8   Period Weeks   Status On-going     PT LONG TERM GOAL #3   Title Decreased pain in R shoulder to 2-3/10 with functional activities   Time 8   Period Weeks   Status On-going     PT LONG TERM GOAL #4   Title R shoulder strength 4+/5 or better to improve function (Not to be addressed until okayed by protocol/MD)   Time 8   Period Weeks   Status On-going               Plan - 07/30/16 1152    Clinical  Impression Statement Patient progressing well overall and tolerated treatment well today. Patient went to MD and is to progress per protocol and MD DC sling. Patient progressing toward goals yet limited due to protocol limitations.   Rehab Potential Excellent   Clinical Impairments Affecting Rehab Potential 06/14/16 surgury/ current 6 week 07/26/16   PT Frequency 3x / week   PT Duration 8 weeks   PT Treatment/Interventions ADLs/Self Care Home Management;Electrical Stimulation;Patient/family education;Neuromuscular re-education;Therapeutic exercise;Manual techniques;Passive range of motion;Vasopneumatic Device   PT Next Visit Plan PROM per protocol given by MD (see media/or binder)   Consulted and Agree with Plan of Care Patient      Patient will benefit from skilled therapeutic intervention in order to improve the following deficits and impairments:  Decreased range of motion, Impaired UE functional use, Pain, Decreased strength  Visit Diagnosis: Pain in right shoulder  Stiffness of right shoulder, not elsewhere classified     Problem List Patient Active Problem List   Diagnosis Date Noted  . 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 Abdominal pain, acute, bilateral lower quadrant 07/01/2014  . Transaminitis 07/01/2014  . COPD (chronic obstructive pulmonary disease) ([place])   . Asthma   . Interstitial cystitis   . Unspecified vitamin D deficiency 05/29/2013  . Lumbar radiculopathy 05/29/2013  . Back strain 05/29/2013  . Unspecified asthma(493.90) 02/27/2013  . Depression with anxiety 02/22/2011  . Uncontrolled type 2 diabetes mellitus with insulin therapy ([place]) 02/22/2011  . Hyperlipemia 02/22/2011  . HTN (hypertension) 02/22/2011    Deon Duer P, PTA 07/30/2016, 12:04 PM  [facility] [address] [place], [place], [address] Phone: [phone]   Fax:  [phone]  Name: FATEEMA MOTOLA MRN: [id number] Date of  Birth: Jan 10, 1946

## 2016-08-02 ENCOUNTER — Ambulatory Visit: Payer: Medicare Other | Admitting: Physical Therapy

## 2016-08-02 ENCOUNTER — Encounter: Payer: Self-pay | Admitting: Physical Therapy

## 2016-08-02 DIAGNOSIS — M25511 Pain in right shoulder: Secondary | ICD-10-CM | POA: Diagnosis not present

## 2016-08-02 DIAGNOSIS — M25611 Stiffness of right shoulder, not elsewhere classified: Secondary | ICD-10-CM

## 2016-08-02 NOTE — Therapy (Signed)
[facility] [place], [place], [address] Phone: [phone]   Fax:  [phone]  Physical Therapy Treatment  Patient Details  Name: Diana Pratt MRN: [id number] Date of Birth: 09/03/1946 Referring Provider: Justice Britain  Encounter Date: 08/02/2016      PT End of Session - 08/02/16 1105    Visit Number 12   Number of Visits 24   Date for PT Re-Evaluation 09/07/16   PT Start Time 1037   PT Stop Time 1132   PT Time Calculation (min) 55 min   Activity Tolerance Patient tolerated treatment well   Behavior During Therapy [facility] for tasks assessed/performed      Past Medical History:  Diagnosis Date  . Anxiety   . Asthma   . Back pain   . 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 COPD (chronic obstructive pulmonary disease) ([place])    dr. Kenn File  . Depression   . Diabetes mellitus   . Diabetic neuropathy ([place])   . GERD (gastroesophageal reflux disease)    occ heartburn  . Humerus fracture    right  . Hyperlipemia   . Hypertension   . Interstitial cystitis   . Osteoporosis   . Pneumonia    15 yrs ago  . PONV (postoperative nausea and vomiting)   . Shortness of breath dyspnea     Past Surgical History:  Procedure Laterality Date  . ABDOMINAL HYSTERECTOMY  03/1984   partial  . COLONOSCOPY N/A 07/19/2014   Procedure: COLONOSCOPY;  Surgeon: Danie Binder, MD;  [facility];  Service: Endoscopy;  Laterality: N/A;  9:30  . REVERSE SHOULDER ARTHROPLASTY Right 06/14/2016   Procedure: REVERSE SHOULDER ARTHROPLASTY;  Surgeon: Justice Britain, MD;  Location: [place];  Service: Orthopedics;  Laterality: Right;    There were no vitals filed for this visit.      Subjective Assessment - 08/02/16 1041    Subjective Patient reported feeling ongoing stiffness and soreness in shoulder, she sleeps with sling on but keeps it off durning the day   Pertinent History DM, OP, COPD   Limitations Lifting   Patient Stated Goals get over this pain and get back to using this arm   Currently in Pain? Yes   Pain Score 4    Pain Location Shoulder   Pain Orientation Right   Pain Descriptors / Indicators Sharp;Sore;Aching   Pain Type Surgical pain   Pain Onset 1 to 4 weeks ago   Pain Frequency Constant   Aggravating Factors  movement of shoulder   Pain Relieving Factors at rest            [facility] PT Assessment - 08/02/16 0001      PROM   PROM Assessment Site Shoulder   Right/Left Shoulder Right   Right Shoulder Flexion 87 Degrees   Right Shoulder External Rotation 40 Degrees                     [facility] Adult PT Treatment/Exercise - 08/02/16 0001      Exercises   Exercises Shoulder     Shoulder Exercises: Seated   Other Seated Exercises AROM "uppercut" 2x10   Other Seated Exercises AROM "Hitchhiker" 2x10     Shoulder Exercises: Pulleys   Flexion 3 minutes  AAROM   Other Pulley Exercises seated UE ranger for flexion and small circles x 11min     Moist Heat Therapy  Number Minutes Moist Heat 15 Minutes   Moist Heat Location Shoulder     Electrical Stimulation   Electrical Stimulation Location right shoulder   Electrical Stimulation Action IFC   Electrical Stimulation Parameters 1-10hz  x8min   Electrical Stimulation Goals Pain     Manual Therapy   Manual Therapy Passive ROM   Passive ROM RT SHLD PROM in supine into flexion, ABD ,  and ER  within protocol limits                  PT Short Term Goals - 07/16/16 1054      PT SHORT TERM GOAL #1   Title Improve R shoulder ER to 20 degrees   Time 4   Period Weeks   Status Achieved  PROM 20 degrees 07/16/16     PT SHORT TERM GOAL #2   Title decrease R shoulder pain to 5/10.   Time 4   Period Weeks   Status On-going  pain 5/10 and more at times 07/16/16           PT Long Term Goals - 07/16/16 1055      PT LONG TERM GOAL #1   Title I with HEP   Time 8   Period Weeks    Status On-going     PT LONG TERM GOAL #2   Title Improved R shoulder ROM to [facility]   Time 8   Period Weeks   Status On-going     PT LONG TERM GOAL #3   Title Decreased pain in R shoulder to 2-3/10 with functional activities   Time 8   Period Weeks   Status On-going     PT LONG TERM GOAL #4   Title R shoulder strength 4+/5 or better to improve function (Not to be addressed until okayed by protocol/MD)   Time 8   Period Weeks   Status On-going               Plan - 08/02/16 1124    Clinical Impression Statement Patient continues to progress with all activities today. Patient tolerated treatment well today and progressed with seated pulley's and UE ranger today for AAROM per protocol. Educated patient on "uppercut" and "hitchhiker" exercises per protocol. Patient understands exercises and had good technique with demo. Patient current goals ongoing due to protocol limitations.    Rehab Potential Excellent   Clinical Impairments Affecting Rehab Potential 06/14/16 surgury/ current 7 week 08/02/16   PT Frequency 3x / week   PT Duration 8 weeks   PT Treatment/Interventions ADLs/Self Care Home Management;Electrical Stimulation;Patient/family education;Neuromuscular re-education;Therapeutic exercise;Manual techniques;Passive range of motion;Vasopneumatic Device   PT Next Visit Plan PROM per protocol given by MD (see media/or binder)   Consulted and Agree with Plan of Care Patient      Patient will benefit from skilled therapeutic intervention in order to improve the following deficits and impairments:  Decreased range of motion, Impaired UE functional use, Pain, Decreased strength  Visit Diagnosis: Pain in right shoulder  Stiffness of right shoulder, not elsewhere classified     Problem List Patient Active Problem List   Diagnosis Date Noted  . 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 Abdominal pain, acute, bilateral lower quadrant 07/01/2014  . Transaminitis  07/01/2014  . COPD (chronic obstructive pulmonary disease) ([place])   . Asthma   . Interstitial cystitis   . Unspecified vitamin D deficiency 05/29/2013  . Lumbar radiculopathy 05/29/2013  . Back strain 05/29/2013  . Unspecified asthma(493.90)  02/27/2013  . Depression with anxiety 02/22/2011  . Uncontrolled type 2 diabetes mellitus with insulin therapy ([place]) 02/22/2011  . Hyperlipemia 02/22/2011  . HTN (hypertension) 02/22/2011    Etta Gassett P, PTA 08/02/2016, 11:43 AM  [facility] [place], [place], [address] Phone: [phone]   Fax:  [phone]  Name: Diana Pratt MRN: [id number] Date of Birth: 1946-09-28

## 2016-08-06 ENCOUNTER — Ambulatory Visit: Payer: Medicare Other | Attending: Orthopedic Surgery | Admitting: Physical Therapy

## 2016-08-06 ENCOUNTER — Encounter: Payer: Self-pay | Admitting: Physical Therapy

## 2016-08-06 DIAGNOSIS — M25511 Pain in right shoulder: Secondary | ICD-10-CM | POA: Insufficient documentation

## 2016-08-06 DIAGNOSIS — M25611 Stiffness of right shoulder, not elsewhere classified: Secondary | ICD-10-CM | POA: Diagnosis not present

## 2016-08-06 NOTE — Therapy (Signed)
[facility] [place], [place], [address] Phone: [phone]   Fax:  [phone]  Physical Therapy Treatment  Patient Details  Name: Diana Pratt MRN: [id number] Date of Birth: 1946/04/09 Referring Provider: Justice Britain  Encounter Date: 08/06/2016      PT End of Session - 08/06/16 1129    Visit Number 13   Number of Visits 24   Date for PT Re-Evaluation 09/07/16   PT Start Time 1114   PT Stop Time 1211   PT Time Calculation (min) 57 min   Activity Tolerance Patient tolerated treatment well   Behavior During Therapy [facility] for tasks assessed/performed      Past Medical History:  Diagnosis Date  . Anxiety   . Asthma   . Back pain   . 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 COPD (chronic obstructive pulmonary disease) ([place])    dr. Kenn File  . Depression   . Diabetes mellitus   . Diabetic neuropathy ([place])   . GERD (gastroesophageal reflux disease)    occ heartburn  . Humerus fracture    right  . Hyperlipemia   . Hypertension   . Interstitial cystitis   . Osteoporosis   . Pneumonia    15 yrs ago  . PONV (postoperative nausea and vomiting)   . Shortness of breath dyspnea     Past Surgical History:  Procedure Laterality Date  . ABDOMINAL HYSTERECTOMY  03/1984   partial  . COLONOSCOPY N/A 07/19/2014   Procedure: COLONOSCOPY;  Surgeon: Danie Binder, MD;  [facility];  Service: Endoscopy;  Laterality: N/A;  9:30  . REVERSE SHOULDER ARTHROPLASTY Right 06/14/2016   Procedure: REVERSE SHOULDER ARTHROPLASTY;  Surgeon: Justice Britain, MD;  Location: [place];  Service: Orthopedics;  Laterality: Right;    There were no vitals filed for this visit.      Subjective Assessment - 08/06/16 1124    Subjective Patient reported ongoing soreness in shoulder   Patient is accompained by: Family member   Pertinent History DM, OP, COPD   Limitations Lifting   Patient Stated  Goals get over this pain and get back to using this arm   Currently in Pain? Yes   Pain Score 4    Pain Location Shoulder   Pain Orientation Right   Pain Descriptors / Indicators Sore   Pain Type Surgical pain   Pain Onset More than a month ago   Pain Frequency Constant   Aggravating Factors  movement of shoulder   Pain Relieving Factors at rest                         [facility] Adult PT Treatment/Exercise - 08/06/16 0001      Shoulder Exercises: Pulleys   Flexion Other (comment)  75min   Other Pulley Exercises seated UE ranger for flexion and small circles x 50min     Moist Heat Therapy   Number Minutes Moist Heat 15 Minutes   Moist Heat Location Shoulder     Electrical Stimulation   Electrical Stimulation Location right shoulder   Electrical Stimulation Action IFC   Electrical Stimulation Parameters 1-10hz  x27min   Electrical Stimulation Goals Pain     Manual Therapy   Manual Therapy Passive ROM   Passive ROM RT SHLD PROM in supine into flexion, ABD ,  and ER  within protocol limits  PT Short Term Goals - 07/16/16 1054      PT SHORT TERM GOAL #1   Title Improve R shoulder ER to 20 degrees   Time 4   Period Weeks   Status Achieved  PROM 20 degrees 07/16/16     PT SHORT TERM GOAL #2   Title decrease R shoulder pain to 5/10.   Time 4   Period Weeks   Status On-going  pain 5/10 and more at times 07/16/16           PT Long Term Goals - 07/16/16 1055      PT LONG TERM GOAL #1   Title I with HEP   Time 8   Period Weeks   Status On-going     PT LONG TERM GOAL #2   Title Improved R shoulder ROM to [facility]   Time 8   Period Weeks   Status On-going     PT LONG TERM GOAL #3   Title Decreased pain in R shoulder to 2-3/10 with functional activities   Time 8   Period Weeks   Status On-going     PT LONG TERM GOAL #4   Title R shoulder strength 4+/5 or better to improve function (Not to be addressed until okayed by  protocol/MD)   Time 8   Period Weeks   Status On-going               Plan - 08/06/16 1131    Clinical Impression Statement Patient progressing with all activities per protocol. Patient tolerated treatment well with no reported pain increase. Patient ROM is progressing within ROM limitations per protocol. Patient progressing yet ongoing due to protocol limitations.    Rehab Potential Excellent   Clinical Impairments Affecting Rehab Potential 06/14/16 surgury/ current 7 week 08/02/16   PT Frequency 3x / week   PT Duration 8 weeks   PT Treatment/Interventions ADLs/Self Care Home Management;Electrical Stimulation;Patient/family education;Neuromuscular re-education;Therapeutic exercise;Manual techniques;Passive range of motion;Vasopneumatic Device   PT Next Visit Plan cont with POC per protocol given by MD (see media/or binder)   Consulted and Agree with Plan of Care Patient      Patient will benefit from skilled therapeutic intervention in order to improve the following deficits and impairments:  Decreased range of motion, Impaired UE functional use, Pain, Decreased strength  Visit Diagnosis: Right shoulder pain, unspecified chronicity  Stiffness of right shoulder, not elsewhere classified     Problem List Patient Active Problem List   Diagnosis Date Noted  . 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 Abdominal pain, acute, bilateral lower quadrant 07/01/2014  . Transaminitis 07/01/2014  . COPD (chronic obstructive pulmonary disease) ([place])   . Asthma   . Interstitial cystitis   . Unspecified vitamin D deficiency 05/29/2013  . Lumbar radiculopathy 05/29/2013  . Back strain 05/29/2013  . Unspecified asthma(493.90) 02/27/2013  . Depression with anxiety 02/22/2011  . Uncontrolled type 2 diabetes mellitus with insulin therapy ([place]) 02/22/2011  . Hyperlipemia 02/22/2011  . HTN (hypertension) 02/22/2011    Madelon Welsch P, PTA 08/06/2016, 12:13 PM  [facility] [place], [place], [address] Phone: [phone]   Fax:  [phone]  Name: Diana Pratt MRN: [id number] Date of Birth: May 06, 1946

## 2016-08-07 DIAGNOSIS — L239 Allergic contact dermatitis, unspecified cause: Secondary | ICD-10-CM | POA: Diagnosis not present

## 2016-08-07 DIAGNOSIS — C44219 Basal cell carcinoma of skin of left ear and external auricular canal: Secondary | ICD-10-CM | POA: Diagnosis not present

## 2016-08-07 HISTORY — PX: OTHER SURGICAL HISTORY: SHX169

## 2016-08-09 ENCOUNTER — Encounter: Payer: Self-pay | Admitting: Physical Therapy

## 2016-08-09 ENCOUNTER — Ambulatory Visit: Payer: Medicare Other | Admitting: Physical Therapy

## 2016-08-09 DIAGNOSIS — M25511 Pain in right shoulder: Secondary | ICD-10-CM

## 2016-08-09 DIAGNOSIS — M25611 Stiffness of right shoulder, not elsewhere classified: Secondary | ICD-10-CM | POA: Diagnosis not present

## 2016-08-09 NOTE — Therapy (Signed)
[facility] [place], [place], [address] Phone: [phone]   Fax:  [phone]  Physical Therapy Treatment  Patient Details  Name: Diana Pratt MRN: [id number] Date of Birth: 1946-02-06 Referring Provider: Justice Britain  Encounter Date: 08/09/2016      PT End of Session - 08/09/16 1516    Visit Number 14   Number of Visits 24   Date for PT Re-Evaluation 09/07/16   PT Start Time [id number]   PT Stop Time 1604   PT Time Calculation (min) 49 min   Activity Tolerance Patient tolerated treatment well   Behavior During Therapy [facility] for tasks assessed/performed      Past Medical History:  Diagnosis Date  . Anxiety   . Asthma   . Back pain   . 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 COPD (chronic obstructive pulmonary disease) ([place])    dr. Kenn File  . Depression   . Diabetes mellitus   . Diabetic neuropathy ([place])   . GERD (gastroesophageal reflux disease)    occ heartburn  . Humerus fracture    right  . Hyperlipemia   . Hypertension   . Interstitial cystitis   . Osteoporosis   . Pneumonia    15 yrs ago  . PONV (postoperative nausea and vomiting)   . Shortness of breath dyspnea     Past Surgical History:  Procedure Laterality Date  . ABDOMINAL HYSTERECTOMY  03/1984   partial  . COLONOSCOPY N/A 07/19/2014   Procedure: COLONOSCOPY;  Surgeon: Danie Binder, MD;  [facility];  Service: Endoscopy;  Laterality: N/A;  9:30  . REVERSE SHOULDER ARTHROPLASTY Right 06/14/2016   Procedure: REVERSE SHOULDER ARTHROPLASTY;  Surgeon: Justice Britain, MD;  Location: [place];  Service: Orthopedics;  Laterality: Right;    There were no vitals filed for this visit.      Subjective Assessment - 08/09/16 1515    Subjective Patient reports that her shoulder is still sore and doesn't feel too well today as she just had some skin cancer removed from her L ear.   Pertinent History DM, OP,  COPD   Limitations Lifting   Patient Stated Goals get over this pain and get back to using this arm   Currently in Pain? Yes   Pain Score 4    Pain Location Shoulder   Pain Orientation Right   Pain Descriptors / Indicators Sore   Pain Type Surgical pain   Pain Onset More than a month ago            [facility] PT Assessment - 08/09/16 0001      Assessment   Medical Diagnosis s/p R Reverse TSA   Onset Date/Surgical Date 06/14/16   Hand Dominance Right   Next MD Visit 09/14/2016   Prior Therapy no     Precautions   Precautions Shoulder   Precaution Comments SEE PROTOCOL                     [facility] Adult PT Treatment/Exercise - 08/09/16 0001      Shoulder Exercises: Supine   Flexion AAROM;Both;15 reps  limited secondary to fatigue, sharp pain     Shoulder Exercises: Pulleys   Flexion Other (comment)  x5 min   Other Pulley Exercises seated UE ranger for flexion and small circles x 5 min     Shoulder Exercises: Isometric Strengthening   Flexion Other (comment)  5 sec hold x10 reps   Extension Other (comment)  5 sec hold x10 reps   External Rotation Other (comment)  5 sec holds x10 reps   Internal Rotation Other (comment)  5 sec hold x10 reps     Modalities   Modalities Electrical Stimulation;Moist Heat     Moist Heat Therapy   Number Minutes Moist Heat 15 Minutes   Moist Heat Location Shoulder     Electrical Stimulation   Electrical Stimulation Location R shoulder   Electrical Stimulation Action IFC   Electrical Stimulation Parameters 1-10 hz x15 min   Electrical Stimulation Goals Pain     Manual Therapy   Manual Therapy Passive ROM   Passive ROM PROM of R shoulder into flex/ER/IR with gentle holds at end range                  PT Short Term Goals - 07/16/16 1054      PT SHORT TERM GOAL #1   Title Improve R shoulder ER to 20 degrees   Time 4   Period Weeks   Status Achieved  PROM 20 degrees 07/16/16     PT SHORT TERM GOAL #2    Title decrease R shoulder pain to 5/10.   Time 4   Period Weeks   Status On-going  pain 5/10 and more at times 07/16/16           PT Long Term Goals - 07/16/16 1055      PT LONG TERM GOAL #1   Title I with HEP   Time 8   Period Weeks   Status On-going     PT LONG TERM GOAL #2   Title Improved R shoulder ROM to [facility]   Time 8   Period Weeks   Status On-going     PT LONG TERM GOAL #3   Title Decreased pain in R shoulder to 2-3/10 with functional activities   Time 8   Period Weeks   Status On-going     PT LONG TERM GOAL #4   Title R shoulder strength 4+/5 or better to improve function (Not to be addressed until okayed by protocol/MD)   Time 8   Period Weeks   Status On-going               Plan - 08/09/16 1550    Clinical Impression Statement Patient presented in clinic today with continued R shoulder soreness. Patient was observed as using compensatory strategy of R shoulder elevation with shoulder flexion activities. Patient was introduced to R shoulder isometrics too and required moderate to maximum multimodal cueing for proper technique and positioning. Patient limited today with supine AAROM flexion secondary to shoulder fatigue and patient reporting sharp pain with flexion. Patient tolerated PROM of R shoulder better with dissipating sharp R shoulder pains and limitations with ROM. Normal modalities response noted following removal of the modalities.   Rehab Potential Excellent   Clinical Impairments Affecting Rehab Potential 06/14/16 surgury/ current 7 week 08/02/16   PT Frequency 3x / week   PT Duration 8 weeks   PT Treatment/Interventions ADLs/Self Care Home Management;Electrical Stimulation;Patient/family education;Neuromuscular re-education;Therapeutic exercise;Manual techniques;Passive range of motion;Vasopneumatic Device   PT Next Visit Plan cont with POC per protocol given by MD (see media/or binder)   PT Home Exercise Plan elbow, wrist ROM and hand squeezes    Consulted and Agree with Plan of Care Patient      Patient will benefit from skilled therapeutic intervention in order to  improve the following deficits and impairments:  Decreased range of motion, Impaired UE functional use, Pain, Decreased strength  Visit Diagnosis: Right shoulder pain, unspecified chronicity  Stiffness of right shoulder, not elsewhere classified     Problem List Patient Active Problem List   Diagnosis Date Noted  . 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 Abdominal pain, acute, bilateral lower quadrant 07/01/2014  . Transaminitis 07/01/2014  . COPD (chronic obstructive pulmonary disease) ([place])   . Asthma   . Interstitial cystitis   . Unspecified vitamin D deficiency 05/29/2013  . Lumbar radiculopathy 05/29/2013  . Back strain 05/29/2013  . Unspecified asthma(493.90) 02/27/2013  . Depression with anxiety 02/22/2011  . Uncontrolled type 2 diabetes mellitus with insulin therapy ([place]) 02/22/2011  . Hyperlipemia 02/22/2011  . HTN (hypertension) 02/22/2011    Wynelle Fanny, PTA 08/09/2016, 4:08 PM  [facility] [address] [place], [place], [address] Phone: [phone]   Fax:  [phone]  Name: DAILEE KLINGEL MRN: [id number] Date of Birth: 23-Apr-1946

## 2016-08-13 ENCOUNTER — Ambulatory Visit: Payer: Medicare Other | Admitting: Physical Therapy

## 2016-08-13 ENCOUNTER — Encounter: Payer: Self-pay | Admitting: Physical Therapy

## 2016-08-13 DIAGNOSIS — M25611 Stiffness of right shoulder, not elsewhere classified: Secondary | ICD-10-CM

## 2016-08-13 DIAGNOSIS — M25511 Pain in right shoulder: Secondary | ICD-10-CM | POA: Diagnosis not present

## 2016-08-13 NOTE — Therapy (Signed)
[facility] [place], [place], [address] Phone: [phone]   Fax:  [phone]  Physical Therapy Treatment  Patient Details  Name: Diana Pratt MRN: [id number] Date of Birth: 05-Nov-1946 Referring Provider: Justice Britain  Encounter Date: 08/13/2016      PT End of Session - 08/13/16 1157    Visit Number 15   Number of Visits 24   Date for PT Re-Evaluation 09/07/16   PT Start Time 1118   PT Stop Time 1210   PT Time Calculation (min) 52 min   Activity Tolerance Patient tolerated treatment well   Behavior During Therapy [facility] for tasks assessed/performed      Past Medical History:  Diagnosis Date  . Anxiety   . Asthma   . Back pain   . 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 COPD (chronic obstructive pulmonary disease) ([place])    dr. Kenn File  . Depression   . Diabetes mellitus   . Diabetic neuropathy ([place])   . GERD (gastroesophageal reflux disease)    occ heartburn  . Humerus fracture    right  . Hyperlipemia   . Hypertension   . Interstitial cystitis   . Osteoporosis   . Pneumonia    15 yrs ago  . PONV (postoperative nausea and vomiting)   . Shortness of breath dyspnea     Past Surgical History:  Procedure Laterality Date  . ABDOMINAL HYSTERECTOMY  03/1984   partial  . COLONOSCOPY N/A 07/19/2014   Procedure: COLONOSCOPY;  Surgeon: Danie Binder, MD;  [facility];  Service: Endoscopy;  Laterality: N/A;  9:30  . REVERSE SHOULDER ARTHROPLASTY Right 06/14/2016   Procedure: REVERSE SHOULDER ARTHROPLASTY;  Surgeon: Justice Britain, MD;  Location: [place];  Service: Orthopedics;  Laterality: Right;    There were no vitals filed for this visit.      Subjective Assessment - 08/13/16 1118    Subjective Reports stiffness and soreness around the anterior aspect of R shoulder especially where stitches were present.   Pertinent History DM, OP, COPD   Limitations  Lifting   Patient Stated Goals get over this pain and get back to using this arm   Currently in Pain? Yes   Pain Score 4    Pain Location Shoulder   Pain Orientation Right   Pain Descriptors / Indicators Sore   Pain Type Surgical pain   Pain Onset More than a month ago            [facility] PT Assessment - 08/13/16 0001      Assessment   Medical Diagnosis s/p R Reverse TSA   Onset Date/Surgical Date 06/14/16   Hand Dominance Right   Next MD Visit 09/14/2016   Prior Therapy no     Precautions   Precautions Shoulder   Precaution Comments SEE PROTOCOL                     [facility] Adult PT Treatment/Exercise - 08/13/16 0001      Shoulder Exercises: Seated   Other Seated Exercises B scpaular squeeze x20 reps with 5 sec hold   Other Seated Exercises AROM "[place]" and "uppercut" 2x10     Shoulder Exercises: Pulleys   Flexion Other (comment)  x5 min   Other Pulley Exercises seated UE ranger for flexion and small circles x 5 min     Shoulder Exercises: Isometric Strengthening   Flexion Other (  comment)  10 reps x10 sec hold   Extension Other (comment)  10 sec hold x10 reps   External Rotation Other (comment)  10 sec hold x10 reps   Internal Rotation Other (comment)  10 sec hold x10 reps     Modalities   Modalities Electrical Stimulation;Moist Heat     Moist Heat Therapy   Number Minutes Moist Heat 15 Minutes   Moist Heat Location Shoulder     Electrical Stimulation   Electrical Stimulation Location R shoulder   Electrical Stimulation Action IFC   Electrical Stimulation Parameters 1-10 hz x15 min   Electrical Stimulation Goals Pain     Manual Therapy   Manual Therapy Passive ROM   Passive ROM PROM of R shoulder into flex/ER with gentle holds at end range                  PT Short Term Goals - 07/16/16 1054      PT SHORT TERM GOAL #1   Title Improve R shoulder ER to 20 degrees   Time 4   Period Weeks   Status Achieved  PROM 20 degrees  07/16/16     PT SHORT TERM GOAL #2   Title decrease R shoulder pain to 5/10.   Time 4   Period Weeks   Status On-going  pain 5/10 and more at times 07/16/16           PT Long Term Goals - 07/16/16 1055      PT LONG TERM GOAL #1   Title I with HEP   Time 8   Period Weeks   Status On-going     PT LONG TERM GOAL #2   Title Improved R shoulder ROM to [facility]   Time 8   Period Weeks   Status On-going     PT LONG TERM GOAL #3   Title Decreased pain in R shoulder to 2-3/10 with functional activities   Time 8   Period Weeks   Status On-going     PT LONG TERM GOAL #4   Title R shoulder strength 4+/5 or better to improve function (Not to be addressed until okayed by protocol/MD)   Time 8   Period Weeks   Status On-going               Plan - 08/13/16 1158    Clinical Impression Statement Patient presents in clinic with continued R shoulder soreness especially in anterior R shoulder where stitches were present per patient report. Patient required only minimal multimodal cueing for R shoulder isometrics today with no discomfort reported during isometrics. Patient remains limited regarding AROM and pulley exercises in sitting. Periscapular strengthening with scapular squeezes introduced today with VCs to avoid patient extending into extension beyond neutral. Firm end feels noted with PROM of R shoulder into both flexion and ER. Normal modalities response noted following removal of the modalities with R humerus and elbow on pillow to avoid extension past neutral. Patient experienced "a little" improvement regarding R shoulder stiffness following removal of the modalities.   Rehab Potential Excellent   Clinical Impairments Affecting Rehab Potential 06/14/16 surgury/ current 7 week 08/02/16   PT Frequency 3x / week   PT Duration 8 weeks   PT Treatment/Interventions ADLs/Self Care Home Management;Electrical Stimulation;Patient/family education;Neuromuscular re-education;Therapeutic  exercise;Manual techniques;Passive range of motion;Vasopneumatic Device   PT Next Visit Plan cont with POC per protocol given by MD (see media/or binder)   PT Home Exercise Plan elbow, wrist ROM and  hand squeezes   Consulted and Agree with Plan of Care Patient      Patient will benefit from skilled therapeutic intervention in order to improve the following deficits and impairments:  Decreased range of motion, Impaired UE functional use, Pain, Decreased strength  Visit Diagnosis: Right shoulder pain, unspecified chronicity  Stiffness of right shoulder, not elsewhere classified     Problem List Patient Active Problem List   Diagnosis Date Noted  . 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 Abdominal pain, acute, bilateral lower quadrant 07/01/2014  . Transaminitis 07/01/2014  . COPD (chronic obstructive pulmonary disease) ([place])   . Asthma   . Interstitial cystitis   . Unspecified vitamin D deficiency 05/29/2013  . Lumbar radiculopathy 05/29/2013  . Back strain 05/29/2013  . Unspecified asthma(493.90) 02/27/2013  . Depression with anxiety 02/22/2011  . Uncontrolled type 2 diabetes mellitus with insulin therapy ([place]) 02/22/2011  . Hyperlipemia 02/22/2011  . HTN (hypertension) 02/22/2011    Wynelle Fanny, PTA 08/13/2016, 12:15 PM  [facility] [address] [place], [place], [address] Phone: [phone]   Fax:  [phone]  Name: KYLEEANN PARROT MRN: [id number] Date of Birth: 1946/06/30

## 2016-08-14 DIAGNOSIS — C44219 Basal cell carcinoma of skin of left ear and external auricular canal: Secondary | ICD-10-CM | POA: Diagnosis not present

## 2016-08-16 ENCOUNTER — Ambulatory Visit: Payer: Medicare Other | Admitting: Physical Therapy

## 2016-08-16 DIAGNOSIS — M25611 Stiffness of right shoulder, not elsewhere classified: Secondary | ICD-10-CM

## 2016-08-16 DIAGNOSIS — M25511 Pain in right shoulder: Secondary | ICD-10-CM

## 2016-08-16 NOTE — Therapy (Signed)
[facility] [place], [place], [address] Phone: [phone]   Fax:  [phone]  Physical Therapy Treatment  Patient Details  Name: Diana Pratt MRN: [id number] Date of Birth: Mar 12, 1946 Referring Provider: Justice Britain  Encounter Date: 08/16/2016      PT End of Session - 08/16/16 1714    Visit Number 16   Number of Visits 24   Date for PT Re-Evaluation 09/07/16   PT Start Time 0232   PT Stop Time 0322   PT Time Calculation (min) 50 min   Activity Tolerance Patient tolerated treatment well   Behavior During Therapy [facility] for tasks assessed/performed      Past Medical History:  Diagnosis Date  . Anxiety   . Asthma   . Back pain   . 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 COPD (chronic obstructive pulmonary disease) ([place])    dr. Kenn File  . Depression   . Diabetes mellitus   . Diabetic neuropathy ([place])   . GERD (gastroesophageal reflux disease)    occ heartburn  . Humerus fracture    right  . Hyperlipemia   . Hypertension   . Interstitial cystitis   . Osteoporosis   . Pneumonia    15 yrs ago  . PONV (postoperative nausea and vomiting)   . Shortness of breath dyspnea     Past Surgical History:  Procedure Laterality Date  . ABDOMINAL HYSTERECTOMY  03/1984   partial  . COLONOSCOPY N/A 07/19/2014   Procedure: COLONOSCOPY;  Surgeon: Danie Binder, MD;  [facility];  Service: Endoscopy;  Laterality: N/A;  9:30  . REVERSE SHOULDER ARTHROPLASTY Right 06/14/2016   Procedure: REVERSE SHOULDER ARTHROPLASTY;  Surgeon: Justice Britain, MD;  Location: [place];  Service: Orthopedics;  Laterality: Right;    There were no vitals filed for this visit.      Subjective Assessment - 08/16/16 1710    Subjective My pain is staying around a 4/10.   Patient Stated Goals get over this pain and get back to using this arm   Pain Score 4    Pain Location Shoulder   Pain  Orientation Right   Pain Descriptors / Indicators Sore   Pain Type Surgical pain   Pain Onset More than a month ago                         [facility] Adult PT Treatment/Exercise - 08/16/16 0001      Shoulder Exercises: Pulleys   Flexion Limitations 7 minutes.     Modalities   Modalities Electrical Stimulation     Moist Heat Therapy   Number Minutes Moist Heat 15 Minutes   Moist Heat Location --  Right shoulder.     Acupuncturist Location RT shoulder.   Electrical Stimulation Action Constant Pre-mod   Electrical Stimulation Parameters 80-150 Hz x 15 minutes.     Manual Therapy   Passive ROM PROM into right shoulder ER in plane of scapula x 10 minutes while receiving STW/M to patients left incisional site and humeral attachment of pec major and AAROM into right shoulder flexion and ER x 6 minutes.                  PT Short Term Goals - 07/16/16 1054      PT SHORT TERM GOAL #1   Title Improve R shoulder  ER to 20 degrees   Time 4   Period Weeks   Status Achieved  PROM 20 degrees 07/16/16     PT SHORT TERM GOAL #2   Title decrease R shoulder pain to 5/10.   Time 4   Period Weeks   Status On-going  pain 5/10 and more at times 07/16/16           PT Long Term Goals - 07/16/16 1055      PT LONG TERM GOAL #1   Title I with HEP   Time 8   Period Weeks   Status On-going     PT LONG TERM GOAL #2   Title Improved R shoulder ROM to [facility]   Time 8   Period Weeks   Status On-going     PT LONG TERM GOAL #3   Title Decreased pain in R shoulder to 2-3/10 with functional activities   Time 8   Period Weeks   Status On-going     PT LONG TERM GOAL #4   Title R shoulder strength 4+/5 or better to improve function (Not to be addressed until okayed by protocol/MD)   Time 8   Period Weeks   Status On-going             Patient will benefit from skilled therapeutic intervention in order to improve the  following deficits and impairments:  Decreased range of motion, Impaired UE functional use, Pain, Decreased strength  Visit Diagnosis: Right shoulder pain, unspecified chronicity  Stiffness of right shoulder, not elsewhere classified  Acute pain of right shoulder     Problem List Patient Active Problem List   Diagnosis Date Noted  . 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 Abdominal pain, acute, bilateral lower quadrant 07/01/2014  . Transaminitis 07/01/2014  . COPD (chronic obstructive pulmonary disease) ([place])   . Asthma   . Interstitial cystitis   . Unspecified vitamin D deficiency 05/29/2013  . Lumbar radiculopathy 05/29/2013  . Back strain 05/29/2013  . Unspecified asthma(493.90) 02/27/2013  . Depression with anxiety 02/22/2011  . Uncontrolled type 2 diabetes mellitus with insulin therapy ([place]) 02/22/2011  . Hyperlipemia 02/22/2011  . HTN (hypertension) 02/22/2011    Lyn Joens, [place] MPT 08/16/2016, 5:16 PM  [facility] [address] [place], [place], [address] Phone: [phone]   Fax:  [phone]  Name: Diana Pratt MRN: [id number] Date of Birth: 1945/11/28

## 2016-08-17 ENCOUNTER — Ambulatory Visit (INDEPENDENT_AMBULATORY_CARE_PROVIDER_SITE_OTHER): Payer: Medicare Other | Admitting: Family Medicine

## 2016-08-17 ENCOUNTER — Encounter: Payer: Self-pay | Admitting: Family Medicine

## 2016-08-17 VITALS — BP 146/72 | HR 96 | Temp 96.8°F | Ht 64.0 in | Wt 208.0 lb

## 2016-08-17 DIAGNOSIS — F411 Generalized anxiety disorder: Secondary | ICD-10-CM

## 2016-08-17 DIAGNOSIS — I10 Essential (primary) hypertension: Secondary | ICD-10-CM

## 2016-08-17 DIAGNOSIS — Z5189 Encounter for other specified aftercare: Secondary | ICD-10-CM

## 2016-08-17 MED ORDER — ESCITALOPRAM OXALATE 20 MG PO TABS: 20.0000 mg | ORAL_TABLET | Freq: Every day | ORAL | 3 refills | Status: DC

## 2016-08-17 MED ORDER — LORAZEPAM 1 MG PO TABS: ORAL_TABLET | ORAL | 2 refills | Status: DC

## 2016-08-17 MED ORDER — HYDROCHLOROTHIAZIDE 25 MG PO TABS: 25.0000 mg | ORAL_TABLET | Freq: Every day | ORAL | 3 refills | Status: DC

## 2016-08-17 MED ORDER — AMLODIPINE BESYLATE-VALSARTAN 10-320 MG PO TABS: 1.0000 | ORAL_TABLET | Freq: Every day | ORAL | 3 refills | Status: DC

## 2016-08-17 MED ORDER — METOPROLOL SUCCINATE ER 50 MG PO TB24: 50.0000 mg | ORAL_TABLET | Freq: Every day | ORAL | 3 refills | Status: DC

## 2016-08-17 NOTE — Patient Instructions (Signed)
Great to see you!  Lets bring you back in 6 weeks or so to see how your blood pressure is  I have increased your lexaporo to 20 mg once daily  I have started metoprolol for your blood pressure

## 2016-08-17 NOTE — Progress Notes (Signed)
   HPI  Patient presents today Here for follow-up chronic medical conditions and the wound.  Wound Left ear, recent Mohs excisional surgery with several stitches, she's had some bleeding and states that she needs a bandage change.  Hypertension Average systolic is 0000000 No chest pain, dyspnea, palpitations, leg edema, no history of stroke or heart attack.  Anxiety Has been taking lorazepam 2 times daily for several years, takes the second pill for sleep as well. Lexapro is helping some but not completely. Denies suicidal thoughts.  PMH: Smoking status noted ROS: Per HPI  Objective: BP (!) 146/72   Pulse 96   Temp (!) 96.8 F (36 C) (Oral)   Ht 5\' 4"  (1.626 m)   Wt 208 lb (94.3 kg)   BMI 35.70 kg/m  Gen: NAD, alert, cooperative with exam HEENT: NCAT CV: RRR, good S1/S2, no murmur Resp: CTABL, no wheezes, non-labored Ext: No edema, warm Neuro: Alert and oriented, No gross deficits  Skin:  Left ear with sutures in place, she has iodoform gauze covering it with no signs of bleeding. Posterior to the ear there is fresh blood which is cleaned in clinic.  Mupirocin applied, Telfa bandage, and taped in place.  Assessment and plan:  # Wound Care After Mohs excisional surgery, bandage replaced in clinic.  # GAD Stable Planning to rediuce ativan dose Increased lexapro Refilled ativan  # HTN Borderline COntinue exforge, HCTZ Add bb F/u 6 weeks   Meds ordered this encounter  Medications  . escitalopram (LEXAPRO) 20 MG tablet    Sig: Take 1 tablet (20 mg total) by mouth daily.    Dispense:  90 tablet    Refill:  3  . LORazepam (ATIVAN) 1 MG tablet    Sig: TAKE 1 TABLET TWICE DAILY AS NEEDED FOR ANXIETY    Dispense:  60 tablet    Refill:  2  . metoprolol succinate (TOPROL-XL) 50 MG 24 hr tablet    Sig: Take 1 tablet (50 mg total) by mouth daily. Take with or immediately following a meal.    Dispense:  90 tablet    Refill:  [place], MD [facility] 08/17/2016, 8:23 AM

## 2016-08-20 ENCOUNTER — Encounter: Payer: Self-pay | Admitting: Physical Therapy

## 2016-08-20 ENCOUNTER — Ambulatory Visit: Payer: Medicare Other | Admitting: Physical Therapy

## 2016-08-20 DIAGNOSIS — M25511 Pain in right shoulder: Secondary | ICD-10-CM

## 2016-08-20 DIAGNOSIS — M25611 Stiffness of right shoulder, not elsewhere classified: Secondary | ICD-10-CM

## 2016-08-20 NOTE — Therapy (Signed)
[facility] [place], [place], [address] Phone: [phone]   Fax:  [phone]  Physical Therapy Treatment  Patient Details  Name: Diana Pratt MRN: [id number] Date of Birth: [birth year] Referring Provider: Justice Britain  Encounter Date: 08/20/2016      PT End of Session - 08/20/16 1121    Visit Number 17   Number of Visits 24   Date for PT Re-Evaluation 09/07/16   PT Start Time 1117   PT Stop Time 1203   PT Time Calculation (min) 46 min   Activity Tolerance Patient tolerated treatment well   Behavior During Therapy [facility] for tasks assessed/performed      Past Medical History:  Diagnosis Date  . Anxiety   . Asthma   . Back pain   . 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 COPD (chronic obstructive pulmonary disease) ([place])    dr. Kenn File  . Depression   . Diabetes mellitus   . Diabetic neuropathy ([place])   . GERD (gastroesophageal reflux disease)    occ heartburn  . Humerus fracture    right  . Hyperlipemia   . Hypertension   . Interstitial cystitis   . Osteoporosis   . Pneumonia    15 yrs ago  . PONV (postoperative nausea and vomiting)   . Shortness of breath dyspnea     Past Surgical History:  Procedure Laterality Date  . ABDOMINAL HYSTERECTOMY  03/1984   partial  . COLONOSCOPY N/A 07/19/2014   Procedure: COLONOSCOPY;  Surgeon: Danie Binder, MD;  [facility];  Service: Endoscopy;  Laterality: N/A;  9:30  . REVERSE SHOULDER ARTHROPLASTY Right 06/14/2016   Procedure: REVERSE SHOULDER ARTHROPLASTY;  Surgeon: Justice Britain, MD;  Location: [place];  Service: Orthopedics;  Laterality: Right;    There were no vitals filed for this visit.      Subjective Assessment - 08/20/16 1119    Subjective Reports that she slid out of her lift chair this weekend and her shoulder has been sore since.   Pertinent History DM, OP, COPD   Limitations Lifting   Patient  Stated Goals get over this pain and get back to using this arm   Currently in Pain? Yes   Pain Score 5    Pain Location Shoulder   Pain Orientation Right   Pain Descriptors / Indicators Sore   Pain Type Surgical pain   Pain Onset More than a month ago            [facility] PT Assessment - 08/20/16 0001      Assessment   Medical Diagnosis s/p R Reverse TSA   Onset Date/Surgical Date 06/14/16   Hand Dominance Right   Next MD Visit 09/14/2016   Prior Therapy no     Precautions   Precautions Shoulder   Precaution Comments SEE PROTOCOL                     [facility] Adult PT Treatment/Exercise - 08/20/16 0001      Shoulder Exercises: Seated   External Rotation AROM;Right;20 reps   Other Seated Exercises R table slides x20 reps,, B scapular squeeze x20 reps   Other Seated Exercises AROM "uppercut" 2x10     Shoulder Exercises: Pulleys   Flexion Other (comment)  x5 min   Other Pulley Exercises seated UE ranger for flexion and small circles x 5 min     Shoulder  Exercises: Isometric Strengthening   Flexion Other (comment)  10 reps 5 sec hold   Extension Other (comment)  10 reps 5 sec hold   External Rotation Other (comment)  10 reps 5 sec hold   Internal Rotation Other (comment)  10 reps 5 sec hold     Modalities   Modalities Electrical Stimulation;Moist Heat     Moist Heat Therapy   Number Minutes Moist Heat 15 Minutes   Moist Heat Location Shoulder     Electrical Stimulation   Electrical Stimulation Location R shoulder   Electrical Stimulation Action Pre-Mod   Electrical Stimulation Parameters 80-150 hz x15 min   Electrical Stimulation Goals Pain     Manual Therapy   Manual Therapy Passive ROM   Passive ROM PROM of R shoulder into flex/ER with gentle holds at end range                  PT Short Term Goals - 07/16/16 1054      PT SHORT TERM GOAL #1   Title Improve R shoulder ER to 20 degrees   Time 4   Period Weeks   Status Achieved   PROM 20 degrees 07/16/16     PT SHORT TERM GOAL #2   Title decrease R shoulder pain to 5/10.   Time 4   Period Weeks   Status On-going  pain 5/10 and more at times 07/16/16           PT Long Term Goals - 07/16/16 1055      PT LONG TERM GOAL #1   Title I with HEP   Time 8   Period Weeks   Status On-going     PT LONG TERM GOAL #2   Title Improved R shoulder ROM to [facility]   Time 8   Period Weeks   Status On-going     PT LONG TERM GOAL #3   Title Decreased pain in R shoulder to 2-3/10 with functional activities   Time 8   Period Weeks   Status On-going     PT LONG TERM GOAL #4   Title R shoulder strength 4+/5 or better to improve function (Not to be addressed until okayed by protocol/MD)   Time 8   Period Weeks   Status On-going               Plan - 08/20/16 1201    Clinical Impression Statement Patient continues to present in clinic with limitations with RUE elevation at this time. Patient presented in clinic today with R shoulder soreness from sliding out of her lift chair at home. Patient continues to require moderate to maximal multimodal cueing for proper exercise technique with isometrics especially. Patient continued with periscapular strengthening as to strengthen to avoid compensation. Firm end feels noted with PROM of R shoulder into flexion and ER. Normal modalities response noted following removal of the modalities.   Rehab Potential Excellent   Clinical Impairments Affecting Rehab Potential 06/14/16 surgury/ current 7 week 08/02/16   PT Frequency 3x / week   PT Duration 8 weeks   PT Treatment/Interventions ADLs/Self Care Home Management;Electrical Stimulation;Patient/family education;Neuromuscular re-education;Therapeutic exercise;Manual techniques;Passive range of motion;Vasopneumatic Device   PT Next Visit Plan cont with POC per protocol given by MD (see media/or binder)   PT Home Exercise Plan elbow, wrist ROM and hand squeezes   Consulted and Agree  with Plan of Care Patient      Patient will benefit from skilled therapeutic intervention in order  to improve the following deficits and impairments:  Decreased range of motion, Impaired UE functional use, Pain, Decreased strength  Visit Diagnosis: Right shoulder pain, unspecified chronicity  Stiffness of right shoulder, not elsewhere classified     Problem List Patient Active Problem List   Diagnosis Date Noted  . 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 Abdominal pain, acute, bilateral lower quadrant 07/01/2014  . Transaminitis 07/01/2014  . COPD (chronic obstructive pulmonary disease) ([place])   . Asthma   . Interstitial cystitis   . Unspecified vitamin D deficiency 05/29/2013  . Lumbar radiculopathy 05/29/2013  . Back strain 05/29/2013  . Unspecified asthma(493.90) 02/27/2013  . Depression with anxiety 02/22/2011  . Uncontrolled type 2 diabetes mellitus with insulin therapy ([place]) 02/22/2011  . Hyperlipemia 02/22/2011  . HTN (hypertension) 02/22/2011    Wynelle Fanny, PTA 08/20/2016, 12:07 PM  [facility] [address] [place], [place], [address] Phone: [phone]   Fax:  [phone]  Name: ROSLYN KERRICK MRN: [id number] Date of Birth: September 23, 1946

## 2016-08-24 ENCOUNTER — Ambulatory Visit: Payer: Medicare Other | Admitting: Physical Therapy

## 2016-08-24 ENCOUNTER — Encounter: Payer: Self-pay | Admitting: Physical Therapy

## 2016-08-24 DIAGNOSIS — M25611 Stiffness of right shoulder, not elsewhere classified: Secondary | ICD-10-CM | POA: Diagnosis not present

## 2016-08-24 DIAGNOSIS — M25511 Pain in right shoulder: Secondary | ICD-10-CM | POA: Diagnosis not present

## 2016-08-24 NOTE — Therapy (Signed)
[facility] [place], [place], [address] Phone: [phone]   Fax:  [phone]  Physical Therapy Treatment  Patient Details  Name: Diana Pratt MRN: [id number] Date of Birth: 1946/02/20 Referring Provider: Justice Britain  Encounter Date: 08/24/2016      PT End of Session - 08/24/16 1123    Visit Number 18   Number of Visits 24   Date for PT Re-Evaluation 09/07/16   PT Start Time 1121   PT Stop Time 1210   PT Time Calculation (min) 49 min   Activity Tolerance Patient tolerated treatment well   Behavior During Therapy [facility] for tasks assessed/performed      Past Medical History:  Diagnosis Date  . Anxiety   . Asthma   . Back pain   . 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 COPD (chronic obstructive pulmonary disease) ([place])    dr. Kenn File  . Depression   . Diabetes mellitus   . Diabetic neuropathy ([place])   . GERD (gastroesophageal reflux disease)    occ heartburn  . Humerus fracture    right  . Hyperlipemia   . Hypertension   . Interstitial cystitis   . Osteoporosis   . Pneumonia    15 yrs ago  . PONV (postoperative nausea and vomiting)   . Shortness of breath dyspnea     Past Surgical History:  Procedure Laterality Date  . ABDOMINAL HYSTERECTOMY  03/1984   partial  . COLONOSCOPY N/A 07/19/2014   Procedure: COLONOSCOPY;  Surgeon: Danie Binder, MD;  [facility];  Service: Endoscopy;  Laterality: N/A;  9:30  . REVERSE SHOULDER ARTHROPLASTY Right 06/14/2016   Procedure: REVERSE SHOULDER ARTHROPLASTY;  Surgeon: Justice Britain, MD;  Location: [place];  Service: Orthopedics;  Laterality: Right;    There were no vitals filed for this visit.      Subjective Assessment - 08/24/16 1122    Subjective Reports that she has difficulty with eating with RUE and it is painful. Reports that her pain is staying around 4/10 recently.   Pertinent History DM, OP, COPD   Limitations Lifting   Patient Stated Goals get over this pain and get back to using this arm   Currently in Pain? Yes   Pain Score 4    Pain Location Shoulder   Pain Orientation Right   Pain Descriptors / Indicators Sore   Pain Type Surgical pain   Pain Onset More than a month ago            [facility] PT Assessment - 08/24/16 0001      Assessment   Medical Diagnosis s/p R Reverse TSA   Onset Date/Surgical Date 06/14/16   Hand Dominance Right   Next MD Visit 09/14/2016   Prior Therapy no     Precautions   Precautions Shoulder   Precaution Comments SEE PROTOCOL                     [facility] Adult PT Treatment/Exercise - 08/24/16 0001      Shoulder Exercises: Supine   Flexion AAROM;Right;20 reps     Shoulder Exercises: Seated   Other Seated Exercises B shoulder shrugs x20 reps, scapular retraction x20 reps   Other Seated Exercises AROM "uppercut" and "hitchhiker" 2x10 reps each     Shoulder Exercises: Pulleys   Flexion Other (comment)  x5 min   Other Pulley Exercises seated UE ranger for  flexion and small circles x 5 min     Modalities   Modalities Electrical Stimulation;Moist Heat     Moist Heat Therapy   Number Minutes Moist Heat 15 Minutes   Moist Heat Location Shoulder     Electrical Stimulation   Electrical Stimulation Location R shoulder   Electrical Stimulation Action Pre-Mod   Electrical Stimulation Parameters 80-150 hz x15 min   Electrical Stimulation Goals Pain     Manual Therapy   Manual Therapy Soft tissue mobilization   Soft tissue mobilization STW to R deltoids/ bicep/ lateral Pectoralis to decrease pain and tightness; R shoulder incision mobilizations to promote proper mobility                  PT Short Term Goals - 07/16/16 1054      PT SHORT TERM GOAL #1   Title Improve R shoulder ER to 20 degrees   Time 4   Period Weeks   Status Achieved  PROM 20 degrees 07/16/16     PT SHORT TERM GOAL #2   Title decrease R shoulder  pain to 5/10.   Time 4   Period Weeks   Status On-going  pain 5/10 and more at times 07/16/16           PT Long Term Goals - 07/16/16 1055      PT LONG TERM GOAL #1   Title I with HEP   Time 8   Period Weeks   Status On-going     PT LONG TERM GOAL #2   Title Improved R shoulder ROM to [facility]   Time 8   Period Weeks   Status On-going     PT LONG TERM GOAL #3   Title Decreased pain in R shoulder to 2-3/10 with functional activities   Time 8   Period Weeks   Status On-going     PT LONG TERM GOAL #4   Title R shoulder strength 4+/5 or better to improve function (Not to be addressed until okayed by protocol/MD)   Time 8   Period Weeks   Status On-going               Plan - 08/24/16 1202    Clinical Impression Statement Patient presented in clinic with continued R shoulder soreness and limitations with ROM. Patient remains very limited with R shoulder flexion and ER. AAROM R shoulder flexion introduced today with patient initially starting the motion with assist into flexion as much as tolerated. Patient experienced discomfort (4/10) wtih AAROM flexion in supine. Tightness of the incision noted with incision mobilizations and tightness of pectoralis and deltoids were noted. Patient reported experiencing tenderness along superior R shoulder incision and bicep belly. Minimal bruising along mid to inferior incision noted upon observation today. Normal modalities response noted following removal of the modalities. Patient was educated to complete incision mobilizations at home in circles to further increase mobility gently.   Rehab Potential Excellent   Clinical Impairments Affecting Rehab Potential 06/14/16 surgury/ current 7 week 08/02/16   PT Frequency 3x / week   PT Duration 8 weeks   PT Treatment/Interventions ADLs/Self Care Home Management;Electrical Stimulation;Patient/family education;Neuromuscular re-education;Therapeutic exercise;Manual techniques;Passive range of  motion;Vasopneumatic Device   PT Next Visit Plan cont with POC per protocol given by MD (see media/or binder). Attempt AROM flexion in sidelying and uppercut at diagonal pattern.   PT Home Exercise Plan elbow, wrist ROM and hand squeezes   Consulted and Agree with Plan of Care Patient  Patient will benefit from skilled therapeutic intervention in order to improve the following deficits and impairments:  Decreased range of motion, Impaired UE functional use, Pain, Decreased strength  Visit Diagnosis: Right shoulder pain, unspecified chronicity  Stiffness of right shoulder, not elsewhere classified     Problem List Patient Active Problem List   Diagnosis Date Noted  . 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 Abdominal pain, acute, bilateral lower quadrant 07/01/2014  . Transaminitis 07/01/2014  . COPD (chronic obstructive pulmonary disease) ([place])   . Asthma   . Interstitial cystitis   . Unspecified vitamin D deficiency 05/29/2013  . Lumbar radiculopathy 05/29/2013  . Back strain 05/29/2013  . Unspecified asthma(493.90) 02/27/2013  . Depression with anxiety 02/22/2011  . Uncontrolled type 2 diabetes mellitus with insulin therapy ([place]) 02/22/2011  . Hyperlipemia 02/22/2011  . HTN (hypertension) 02/22/2011    Wynelle Fanny, PTA 08/24/2016, 12:17 PM  [facility] [address] [place], [place], [address] Phone: [phone]   Fax:  [phone]  Name: RHYANNON BARRINEAU MRN: [id number] Date of Birth: 01-22-1946

## 2016-08-27 ENCOUNTER — Encounter: Admitting: Physical Therapy

## 2016-08-30 ENCOUNTER — Encounter: Admitting: *Deleted

## 2016-09-03 ENCOUNTER — Encounter: Payer: Self-pay | Admitting: Physical Therapy

## 2016-09-03 ENCOUNTER — Ambulatory Visit: Payer: Medicare Other | Admitting: Physical Therapy

## 2016-09-03 DIAGNOSIS — M25511 Pain in right shoulder: Secondary | ICD-10-CM

## 2016-09-03 DIAGNOSIS — M25611 Stiffness of right shoulder, not elsewhere classified: Secondary | ICD-10-CM | POA: Diagnosis not present

## 2016-09-03 NOTE — Therapy (Signed)
[facility] [place], [place], [address] Phone: [phone]   Fax:  [phone]  Physical Therapy Treatment  Patient Details  Name: Diana Pratt MRN: [id number] Date of Birth: 10-06-1946 Referring Provider: Justice Britain  Encounter Date: 09/03/2016      PT End of Session - 09/03/16 1305    Visit Number 19   Number of Visits 24   Date for PT Re-Evaluation 09/07/16   PT Start Time [id number]   PT Stop Time 1350   PT Time Calculation (min) 47 min   Activity Tolerance Patient tolerated treatment well   Behavior During Therapy [facility] for tasks assessed/performed      Past Medical History:  Diagnosis Date  . Anxiety   . Asthma   . Back pain   . 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 COPD (chronic obstructive pulmonary disease) ([place])    dr. Kenn File  . Depression   . Diabetes mellitus   . Diabetic neuropathy ([place])   . GERD (gastroesophageal reflux disease)    occ heartburn  . Humerus fracture    right  . Hyperlipemia   . Hypertension   . Interstitial cystitis   . Osteoporosis   . Pneumonia    15 yrs ago  . PONV (postoperative nausea and vomiting)   . Shortness of breath dyspnea     Past Surgical History:  Procedure Laterality Date  . ABDOMINAL HYSTERECTOMY  03/1984   partial  . COLONOSCOPY N/A 07/19/2014   Procedure: COLONOSCOPY;  Surgeon: Danie Binder, MD;  [facility];  Service: Endoscopy;  Laterality: N/A;  9:30  . REVERSE SHOULDER ARTHROPLASTY Right 06/14/2016   Procedure: REVERSE SHOULDER ARTHROPLASTY;  Surgeon: Justice Britain, MD;  Location: [facility];  Service: Orthopedics;  Laterality: Right;    There were no vitals filed for this visit.      Subjective Assessment - 09/03/16 1303    Subjective Reports that she has some stiffness and soreness where she missed last week due to sickness.   Pertinent History DM, OP, COPD   Limitations Lifting   Patient  Stated Goals get over this pain and get back to using this arm   Currently in Pain? Yes   Pain Score 4    Pain Location Shoulder   Pain Orientation Right   Pain Descriptors / Indicators Sore   Pain Type Surgical pain   Pain Onset More than a month ago            [facility] PT Assessment - 09/03/16 0001      Assessment   Medical Diagnosis s/p R Reverse TSA   Onset Date/Surgical Date 06/14/16   Hand Dominance Right   Next MD Visit 09/14/2016   Prior Therapy no     Precautions   Precautions Shoulder   Precaution Comments SEE PROTOCOL                     [facility] Adult PT Treatment/Exercise - 09/03/16 0001      Shoulder Exercises: Seated   External Rotation AROM;Right;20 reps   Other Seated Exercises B shoulder shrugs x20 reps   Other Seated Exercises AROM "uppercut" 2x10 reps     Shoulder Exercises: Pulleys   Flexion Other (comment)  x5 min   Other Pulley Exercises seated UE ranger for flexion and small circles x20 reps     Modalities   Modalities Electrical Stimulation;Moist Heat  Moist Heat Therapy   Number Minutes Moist Heat 15 Minutes   Moist Heat Location Shoulder     Electrical Stimulation   Electrical Stimulation Location R shoulder   Electrical Stimulation Action Pre-mod   Electrical Stimulation Parameters 80-150 hz x15 min   Electrical Stimulation Goals Pain     Manual Therapy   Manual Therapy Passive ROM   Passive ROM PROM of R shoulder into flex/ER/ IR with gentle holds at end range                  PT Short Term Goals - 07/16/16 1054      PT SHORT TERM GOAL #1   Title Improve R shoulder ER to 20 degrees   Time 4   Period Weeks   Status Achieved  PROM 20 degrees 07/16/16     PT SHORT TERM GOAL #2   Title decrease R shoulder pain to 5/10.   Time 4   Period Weeks   Status On-going  pain 5/10 and more at times 07/16/16           PT Long Term Goals - 07/16/16 1055      PT LONG TERM GOAL #1   Title I with HEP    Time 8   Period Weeks   Status On-going     PT LONG TERM GOAL #2   Title Improved R shoulder ROM to [facility]   Time 8   Period Weeks   Status On-going     PT LONG TERM GOAL #3   Title Decreased pain in R shoulder to 2-3/10 with functional activities   Time 8   Period Weeks   Status On-going     PT LONG TERM GOAL #4   Title R shoulder strength 4+/5 or better to improve function (Not to be addressed until okayed by protocol/MD)   Time 8   Period Weeks   Status On-going               Plan - 09/03/16 1336    Clinical Impression Statement Patient arrived to clinic with reports of R shoulder soreness and stiffness after missing a week secondary to sickness. Patient continues to be limited with seated AROM ER and uppercut exercise upon observation. SL AROM R shoulder flexion introduced today and patient able to complete AROM flexion much better in sidelying than in flexion as was attempted in previous treatment. PROM of R shoulder felt improved overall and felt improved to patient as well per patient report. Firm end feels noted with PROM into flexion/ER/iR along with smooth arc of motion. Normal modalities response noted following removal of the modalities. Patient educated to complete AROM flexion into sidelying at home to increase ROM.   Rehab Potential Excellent   Clinical Impairments Affecting Rehab Potential 06/14/16 surgury/ current 7 week 08/02/16   PT Frequency 3x / week   PT Duration 8 weeks   PT Treatment/Interventions ADLs/Self Care Home Management;Electrical Stimulation;Patient/family education;Neuromuscular re-education;Therapeutic exercise;Manual techniques;Passive range of motion;Vasopneumatic Device   PT Next Visit Plan cont with POC per protocol given by MD (see media/or binder). Attempt AROM flexion in sidelying and uppercut at diagonal pattern.   PT Home Exercise Plan elbow, wrist ROM and hand squeezes   Consulted and Agree with Plan of Care Patient      Patient will  benefit from skilled therapeutic intervention in order to improve the following deficits and impairments:  Decreased range of motion, Impaired UE functional use, Pain, Decreased strength  Visit  Diagnosis: Right shoulder pain, unspecified chronicity  Stiffness of right shoulder, not elsewhere classified     Problem List Patient Active Problem List   Diagnosis Date Noted  . 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 Abdominal pain, acute, bilateral lower quadrant 07/01/2014  . Transaminitis 07/01/2014  . COPD (chronic obstructive pulmonary disease) ([place])   . Asthma   . Interstitial cystitis   . Unspecified vitamin D deficiency 05/29/2013  . Lumbar radiculopathy 05/29/2013  . Back strain 05/29/2013  . Unspecified asthma(493.90) 02/27/2013  . Depression with anxiety 02/22/2011  . Uncontrolled type 2 diabetes mellitus with insulin therapy ([place]) 02/22/2011  . Hyperlipemia 02/22/2011  . HTN (hypertension) 02/22/2011    Wynelle Fanny, PTA 09/03/2016, 2:26 PM  [facility] [address] [place], [place], [address] Phone: [phone]   Fax:  [phone]  Name: LIARA PIETROPAOLO MRN: [id number] Date of Birth: 10-06-1946

## 2016-09-06 ENCOUNTER — Ambulatory Visit: Payer: Medicare Other | Attending: Orthopedic Surgery | Admitting: Physical Therapy

## 2016-09-06 ENCOUNTER — Encounter: Payer: Self-pay | Admitting: Physical Therapy

## 2016-09-06 DIAGNOSIS — M25611 Stiffness of right shoulder, not elsewhere classified: Secondary | ICD-10-CM | POA: Diagnosis not present

## 2016-09-06 DIAGNOSIS — M25511 Pain in right shoulder: Secondary | ICD-10-CM | POA: Insufficient documentation

## 2016-09-06 NOTE — Therapy (Signed)
[facility] [place], [place], [address] Phone: [phone]   Fax:  [phone]  Physical Therapy Treatment  Patient Details  Name: Diana Pratt MRN: [id number] Date of Birth: [birth year] Referring Provider: Justice Britain  Encounter Date: 09/06/2016      PT End of Session - 09/06/16 1121    Visit Number 20   Number of Visits 24   Date for PT Re-Evaluation 09/07/16   PT Start Time 1116   PT Stop Time 1206   PT Time Calculation (min) 50 min   Activity Tolerance Patient tolerated treatment well   Behavior During Therapy [facility] for tasks assessed/performed      Past Medical History:  Diagnosis Date  . Anxiety   . Asthma   . Back pain   . 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 COPD (chronic obstructive pulmonary disease) ([place])    dr. Kenn File  . Depression   . Diabetes mellitus   . Diabetic neuropathy ([place])   . GERD (gastroesophageal reflux disease)    occ heartburn  . Humerus fracture    right  . Hyperlipemia   . Hypertension   . Interstitial cystitis   . Osteoporosis   . Pneumonia    15 yrs ago  . PONV (postoperative nausea and vomiting)   . Shortness of breath dyspnea     Past Surgical History:  Procedure Laterality Date  . ABDOMINAL HYSTERECTOMY  03/1984   partial  . COLONOSCOPY N/A 07/19/2014   Procedure: COLONOSCOPY;  Surgeon: Danie Binder, MD;  [facility];  Service: Endoscopy;  Laterality: N/A;  9:30  . REVERSE SHOULDER ARTHROPLASTY Right 06/14/2016   Procedure: REVERSE SHOULDER ARTHROPLASTY;  Surgeon: Justice Britain, MD;  Location: [facility];  Service: Orthopedics;  Laterality: Right;    There were no vitals filed for this visit.      Subjective Assessment - 09/06/16 1121    Subjective Reports that she is continuing to have R shoulder soreness.   Pertinent History DM, OP, COPD   Limitations Lifting   Patient Stated Goals get over this pain and get  back to using this arm   Currently in Pain? Yes   Pain Score 3    Pain Location Shoulder   Pain Orientation Right   Pain Descriptors / Indicators Sore   Pain Type Surgical pain   Pain Onset More than a month ago            [facility] PT Assessment - 09/06/16 0001      Assessment   Medical Diagnosis s/p R Reverse TSA   Onset Date/Surgical Date 06/14/16   Hand Dominance Right   Next MD Visit 09/14/2016   Prior Therapy no     Precautions   Precautions Shoulder   Precaution Comments SEE PROTOCOL                     [facility] Adult PT Treatment/Exercise - 09/06/16 0001      Shoulder Exercises: Supine   Protraction AAROM;Both;20 reps   External Rotation AAROM;Right;20 reps   Flexion AAROM;Right;20 reps     Shoulder Exercises: Sidelying   External Rotation AROM;Right   External Rotation Limitations 3x10 reps   Flexion AROM;Right;Other (comment)  3x10 reps     Shoulder Exercises: Pulleys   Flexion Other (comment)  x6 min   Other Pulley Exercises seated UE ranger for flexion and small circles x20 reps  Modalities   Modalities Electrical Stimulation;Moist Heat     Moist Heat Therapy   Number Minutes Moist Heat 15 Minutes   Moist Heat Location Shoulder     Electrical Stimulation   Electrical Stimulation Location R shoulder   Electrical Stimulation Action Pre-Mod   Electrical Stimulation Parameters 80-150 hz x15 min   Electrical Stimulation Goals Pain     Manual Therapy   Manual Therapy Myofascial release;Passive ROM   Myofascial Release IASTW to R deltoids, tricep to decrease tightness   Passive ROM PROM of R shoulder into flexion with gentle holds at end range                  PT Short Term Goals - 07/16/16 1054      PT SHORT TERM GOAL #1   Title Improve R shoulder ER to 20 degrees   Time 4   Period Weeks   Status Achieved  PROM 20 degrees 07/16/16     PT SHORT TERM GOAL #2   Title decrease R shoulder pain to 5/10.   Time 4   Period  Weeks   Status On-going  pain 5/10 and more at times 07/16/16           PT Long Term Goals - 07/16/16 1055      PT LONG TERM GOAL #1   Title I with HEP   Time 8   Period Weeks   Status On-going     PT LONG TERM GOAL #2   Title Improved R shoulder ROM to [facility]   Time 8   Period Weeks   Status On-going     PT LONG TERM GOAL #3   Title Decreased pain in R shoulder to 2-3/10 with functional activities   Time 8   Period Weeks   Status On-going     PT LONG TERM GOAL #4   Title R shoulder strength 4+/5 or better to improve function (Not to be addressed until okayed by protocol/MD)   Time 8   Period Weeks   Status On-going               Plan - 09/06/16 1157    Clinical Impression Statement Patient arrived to treatment with complaints of continued R shoulder soreness. Patient able to complete exercises as directed although she is still limited with antigravity movements. Patient required max multimodal cueing AAROM and AROM ER as well as AROM flexion in sidelying. Tightness palpated around R triceps, and mid to posterior deltoids today as indicated by palpation. Good petiche response noted with IASTW to R Tricep and mid to posterior deltoids. Patient educated that she may experience soreness following IASTW. Normal modalities response noted following removal of the modalities.   Rehab Potential Excellent   Clinical Impairments Affecting Rehab Potential 06/14/16 surgury/ current 7 week 08/02/16   PT Frequency 3x / week   PT Duration 8 weeks   PT Treatment/Interventions ADLs/Self Care Home Management;Electrical Stimulation;Patient/family education;Neuromuscular re-education;Therapeutic exercise;Manual techniques;Passive range of motion;Vasopneumatic Device   PT Next Visit Plan cont with POC per protocol given by MD (see media/or binder). Attempt AROM flexion in sidelying and uppercut at diagonal pattern.   PT Home Exercise Plan elbow, wrist ROM and hand squeezes   Consulted and  Agree with Plan of Care Patient      Patient will benefit from skilled therapeutic intervention in order to improve the following deficits and impairments:  Decreased range of motion, Impaired UE functional use, Pain, Decreased strength  Visit Diagnosis:  Right shoulder pain, unspecified chronicity  Stiffness of right shoulder, not elsewhere classified     Problem List Patient Active Problem List   Diagnosis Date Noted  . 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 Abdominal pain, acute, bilateral lower quadrant 07/01/2014  . Transaminitis 07/01/2014  . COPD (chronic obstructive pulmonary disease) ([place])   . Asthma   . Interstitial cystitis   . Unspecified vitamin D deficiency 05/29/2013  . Lumbar radiculopathy 05/29/2013  . Back strain 05/29/2013  . Unspecified asthma(493.90) 02/27/2013  . Depression with anxiety 02/22/2011  . Uncontrolled type 2 diabetes mellitus with insulin therapy ([place]) 02/22/2011  . Hyperlipemia 02/22/2011  . HTN (hypertension) 02/22/2011    Wynelle Fanny, PTA 09/06/2016, 12:11 PM  [facility] [address] [place], [place], [address] Phone: [phone]   Fax:  [phone]  Name: Diana Pratt MRN: [id number] Date of Birth: 03/01/1946

## 2016-09-10 ENCOUNTER — Ambulatory Visit: Payer: Medicare Other | Admitting: Physical Therapy

## 2016-09-10 ENCOUNTER — Encounter: Payer: Self-pay | Admitting: Physical Therapy

## 2016-09-10 DIAGNOSIS — M25611 Stiffness of right shoulder, not elsewhere classified: Secondary | ICD-10-CM | POA: Diagnosis not present

## 2016-09-10 DIAGNOSIS — M25511 Pain in right shoulder: Secondary | ICD-10-CM

## 2016-09-10 NOTE — Therapy (Signed)
[facility] [place], [place], [address] Phone: [phone]   Fax:  [phone]  Physical Therapy Treatment  Patient Details  Name: Diana Pratt MRN: [id number] Date of Birth: 09-06-1946 Referring Provider: Justice Britain  Encounter Date: 09/10/2016      PT End of Session - 09/10/16 1508    Visit Number 21   Number of Visits 24   Date for PT Re-Evaluation 09/07/16   PT Start Time 1503  patient arrived late   PT Stop Time 1530   PT Time Calculation (min) 27 min   Activity Tolerance Patient tolerated treatment well   Behavior During Therapy [facility] for tasks assessed/performed      Past Medical History:  Diagnosis Date  . Anxiety   . Asthma   . Back pain   . 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 COPD (chronic obstructive pulmonary disease) ([place])    dr. Kenn File  . Depression   . Diabetes mellitus   . Diabetic neuropathy ([place])   . GERD (gastroesophageal reflux disease)    occ heartburn  . Humerus fracture    right  . Hyperlipemia   . Hypertension   . Interstitial cystitis   . Osteoporosis   . Pneumonia    15 yrs ago  . PONV (postoperative nausea and vomiting)   . Shortness of breath dyspnea     Past Surgical History:  Procedure Laterality Date  . ABDOMINAL HYSTERECTOMY  03/1984   partial  . COLONOSCOPY N/A 07/19/2014   Procedure: COLONOSCOPY;  Surgeon: Danie Binder, MD;  [facility];  Service: Endoscopy;  Laterality: N/A;  9:30  . REVERSE SHOULDER ARTHROPLASTY Right 06/14/2016   Procedure: REVERSE SHOULDER ARTHROPLASTY;  Surgeon: Justice Britain, MD;  Location: [facility];  Service: Orthopedics;  Laterality: Right;    There were no vitals filed for this visit.      Subjective Assessment - 09/10/16 1505    Subjective Patient reported increased pain and difficulty using shoulder overall, patient arrived late today   Patient is accompained by: Family member   Pertinent History DM, OP, COPD   Limitations Lifting   Patient Stated Goals get over this pain and get back to using this arm   Currently in Pain? Yes   Pain Score 3    Pain Location Shoulder   Pain Orientation Right   Pain Descriptors / Indicators Sore   Pain Type Surgical pain   Pain Onset More than a month ago   Pain Frequency Constant   Aggravating Factors  prolong use of shoulder   Pain Relieving Factors at rest            [facility] PT Assessment - 09/10/16 0001      ROM / Strength   AROM / PROM / Strength AROM;PROM     AROM   AROM Assessment Site Shoulder   Right/Left Shoulder Right   Right Shoulder Flexion 72 Degrees     PROM   PROM Assessment Site Shoulder   Right/Left Shoulder Right   Right Shoulder Flexion 110 Degrees                     [facility] Adult PT Treatment/Exercise - 09/10/16 0001      Shoulder Exercises: Supine   Flexion AAROM;Right;20 reps     Shoulder Exercises: Pulleys   Flexion 3 minutes;Other (comment)   Other Pulley Exercises seated UE ranger for  flexion and small circles x20 reps     Moist Heat Therapy   Number Minutes Moist Heat 10 Minutes   Moist Heat Location Shoulder     Electrical Stimulation   Electrical Stimulation Location R shoulder   Electrical Stimulation Action premod   Electrical Stimulation Parameters 80-'150hz'  x59mn   Electrical Stimulation Goals Pain     Manual Therapy   Manual Therapy Passive ROM   Passive ROM PROM of R shoulder into flexion and ER with gentle holds at end range                  PT Short Term Goals - 09/10/16 1512      PT SHORT TERM GOAL #1   Title Improve R shoulder ER to 20 degrees   Time 4   Period Weeks   Status Achieved     PT SHORT TERM GOAL #2   Title decrease R shoulder pain to 5/10.   Time 4   Period Weeks   Status Achieved  3-4/10 at most per patient 09/10/16           PT Long Term Goals - 07/16/16 1055      PT LONG TERM GOAL #1   Title I with HEP    Time 8   Period Weeks   Status On-going     PT LONG TERM GOAL #2   Title [facility]  Time 8   Period Weeks   Status On-going     PT LONG TERM GOAL #3   Title Decreased pain in R shoulder to 2-3/10 with functional activities   Time 8   Period Weeks   Status On-going     PT LONG TERM GOAL #4   Title R shoulder strength 4+/5 or better to improve function (Not to be addressed until okayed by protocol/MD)   Time 8   Period Weeks   Status On-going               Plan - 09/10/16 1523    Clinical Impression Statement Patient progressing slowly with ROM and ongoing difficulty with use of shoulder. Patient has reported ongoing pain up to 3-4/10 at most. Patient has reported difficulty using shouler for eatning and other light ADL's. Patient had increased guarding today with ROM. patient met STG #2 other LTG's ongoing due to ROM, strength and pain deficits.   Rehab Potential Excellent   Clinical Impairments Affecting Rehab Potential 06/14/16 surgury/ current 12 week 09/06/16   PT Frequency 3x / week   PT Duration 8 weeks   PT Treatment/Interventions ADLs/Self Care Home Management;Electrical Stimulation;Patient/family education;Neuromuscular re-education;Therapeutic exercise;Manual techniques;Passive range of motion;Vasopneumatic Device   PT Next Visit Plan cont with POC per protocol given by MD (see binder) MD. supple next treatment for appt on 09/14/16   Consulted and Agree with Plan of Care Patient      Patient will benefit from skilled therapeutic intervention in order to improve the following deficits and impairments:  Decreased range of motion, Impaired UE functional use, Pain, Decreased strength  Visit Diagnosis: Right shoulder pain, unspecified chronicity  Stiffness of right shoulder, not elsewhere classified  Acute pain of right shoulder     Problem List Patient Active Problem List   Diagnosis Date Noted  . 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  . Abdominal pain, acute, bilateral lower quadrant 07/01/2014  . Transaminitis 07/01/2014  . COPD (ch[place] obstructive [place] disease) [place]   . Asthma   .  Interstitial cystitis   . Unspecified vitamin D deficiency 05/29/2013  . Lumbar radiculopathy 05/29/2013  . Back strain 05/29/2013  . Unspecified asthma(493.90) 02/27/2013  . Depression with anxiety 02/22/2011  . Uncontrolled type 2 diabetes mellitus with insulin therapy ([place]) 02/22/2011  . Hyperlipemia 02/22/2011  . HTN (hypertension) 02/22/2011    Phillips Climes 09/10/2016, 3:30 PM  [facility] [place], [place], [address] Phone: [phone]   Fax:  [phone]  Name: Diana Pratt MRN: [id number] Date of Birth: February 18, 1946

## 2016-09-11 DIAGNOSIS — C44219 Basal cell carcinoma of skin of left ear and external auricular canal: Secondary | ICD-10-CM | POA: Diagnosis not present

## 2016-09-13 ENCOUNTER — Ambulatory Visit: Payer: Medicare Other | Admitting: *Deleted

## 2016-09-13 DIAGNOSIS — M25511 Pain in right shoulder: Secondary | ICD-10-CM

## 2016-09-13 DIAGNOSIS — M25611 Stiffness of right shoulder, not elsewhere classified: Secondary | ICD-10-CM | POA: Diagnosis not present

## 2016-09-13 NOTE — Therapy (Signed)
[facility] [place], [place], [address] Phone: [phone]   Fax:  [phone]  Physical Therapy Treatment  Patient Details  Name: Diana Pratt MRN: [id number] Date of Birth: [birth year] Referring Provider: Justice Britain  Encounter Date: 09/13/2016      PT End of Session - 09/13/16 1124    Visit Number 22   Number of Visits 24   Date for PT Re-Evaluation 09/07/16   PT Start Time 1117   PT Stop Time 1209   PT Time Calculation (min) 52 min      Past Medical History:  Diagnosis Date  . Anxiety   . Asthma   . Back pain   . 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 COPD (chronic obstructive pulmonary disease) ([place])    dr. Kenn File  . Depression   . Diabetes mellitus   . Diabetic neuropathy ([place])   . GERD (gastroesophageal reflux disease)    occ heartburn  . Humerus fracture    right  . Hyperlipemia   . Hypertension   . Interstitial cystitis   . Osteoporosis   . Pneumonia    15 yrs ago  . PONV (postoperative nausea and vomiting)   . Shortness of breath dyspnea     Past Surgical History:  Procedure Laterality Date  . ABDOMINAL HYSTERECTOMY  03/1984   partial  . COLONOSCOPY N/A 07/19/2014   Procedure: COLONOSCOPY;  Surgeon: Danie Binder, MD;  [facility];  Service: Endoscopy;  Laterality: N/A;  9:30  . REVERSE SHOULDER ARTHROPLASTY Right 06/14/2016   Procedure: REVERSE SHOULDER ARTHROPLASTY;  Surgeon: Justice Britain, MD;  Location: [facility];  Service: Orthopedics;  Laterality: Right;    There were no vitals filed for this visit.      Subjective Assessment - 09/13/16 1119    Subjective Patient reported increased pain and difficulty using shoulder overall, 3/10 RT shldr   Patient is accompained by: Family member   Pertinent History DM, OP, COPD   Limitations Lifting   Patient Stated Goals get over this pain and get back to using this arm   Currently in Pain?  Yes   Pain Score 3    Pain Location Shoulder   Pain Orientation Right   Pain Descriptors / Indicators Sore   Pain Type Surgical pain   Pain Onset More than a month ago   Pain Frequency Constant            [facility] PT Assessment - 09/13/16 0001      Assessment   Medical Diagnosis s/p R Reverse TSA     AROM   AROM Assessment Site Shoulder   Right/Left Shoulder Right   Right Shoulder Flexion 75 Degrees     PROM   PROM Assessment Site Shoulder   Right/Left Shoulder Right   Right Shoulder Flexion 112 Degrees   Right Shoulder External Rotation 45 Degrees                     [facility] Adult PT Treatment/Exercise - 09/13/16 0001      Shoulder Exercises: Supine   Protraction AAROM;Both;20 reps   External Rotation AAROM;Right;20 reps   Flexion AAROM;Right;20 reps     Shoulder Exercises: Pulleys   Flexion 3 minutes;Other (comment)   Other Pulley Exercises seated UE ranger for flexion and small circles x20 reps     Moist Heat Therapy   Number Minutes Moist Heat 15  Minutes   Moist Heat Location Shoulder     Electrical Stimulation   Electrical Stimulation Location R shoulder  premod x 15 mins 80-150hz    Electrical Stimulation Goals Pain     Manual Therapy   Manual Therapy Passive ROM   Passive ROM PROM/ AAROM of R shoulder into flexion and ER with gentle holds at end range. Rhythmic stabs                   PT Short Term Goals - 09/10/16 1512      PT SHORT TERM GOAL #1   Title Improve R shoulder ER to 20 degrees   Time 4   Period Weeks   Status Achieved     PT SHORT TERM GOAL #2   Title decrease R shoulder pain to 5/10.   Time 4   Period Weeks   Status Achieved  3-4/10 at most per patient 09/10/16           PT Long Term Goals - 07/16/16 1055      PT LONG TERM GOAL #1   Title I with HEP   Time 8   Period Weeks   Status On-going     PT LONG TERM GOAL #2   Title Improved R shoulder ROM to [facility]   Time 8   Period Weeks   Status  On-going     PT LONG TERM GOAL #3   Title Decreased pain in R shoulder to 2-3/10 with functional activities   Time 8   Period Weeks   Status On-going     PT LONG TERM GOAL #4   Title R shoulder strength 4+/5 or better to improve function (Not to be addressed until okayed by protocol/MD)   Time 8   Period Weeks   Status On-going               Plan - 09/13/16 1125    Clinical Impression Statement Pt did fairly well with Rx today and was able to perform exs with minimal pain increase. She still has ROM and strength deficits at this time. She was able to reach supine  Flexion AROM to 90 degrees and PROM to 110 degrees. She will F/U with MD tomorrow and is currently 13 weeks pot-op.   Clinical Impairments Affecting Rehab Potential 06/14/16 surgury/ current 13 week 09/13/16   PT Frequency 3x / week   PT Duration 8 weeks   PT Treatment/Interventions ADLs/Self Care Home Management;Electrical Stimulation;Patient/family education;Neuromuscular re-education;Therapeutic exercise;Manual techniques;Passive range of motion;Vasopneumatic Device   PT Next Visit Plan cont with POC per protocol given by MD (see binder) MD. supple next treatment for appt on 09/14/16   PT Home Exercise Plan elbow, wrist ROM and hand squeezes   Consulted and Agree with Plan of Care Patient      Patient will benefit from skilled therapeutic intervention in order to improve the following deficits and impairments:  Decreased range of motion, Impaired UE functional use, Pain, Decreased strength  Visit Diagnosis: Right shoulder pain, unspecified chronicity  Stiffness of right shoulder, not elsewhere classified     Problem List Patient Active Problem List   Diagnosis Date Noted  . 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 Abdominal pain, acute, bilateral lower quadrant 07/01/2014  . Transaminitis 07/01/2014  . COPD (chronic obstructive pulmonary disease) ([place])   . Asthma   . Interstitial cystitis    . Unspecified vitamin D deficiency 05/29/2013  . Lumbar radiculopathy 05/29/2013  . Back strain 05/29/2013  .  Unspecified asthma(493.90) 02/27/2013  . Depression with anxiety 02/22/2011  . Uncontrolled type 2 diabetes mellitus with insulin therapy ([place]) 02/22/2011  . Hyperlipemia 02/22/2011  . HTN (hypertension) 02/22/2011    APPLEGATE, Mali, PTA 09/13/2016, 2:25 PM Mali Applegate MPT [facility] [address] [place], [place], [address] Phone: [phone]   Fax:  [phone]  Name: NORIE GREENLEES MRN: [id number] Date of Birth: 10-27-1946

## 2016-09-14 DIAGNOSIS — Z471 Aftercare following joint replacement surgery: Secondary | ICD-10-CM | POA: Diagnosis not present

## 2016-09-14 DIAGNOSIS — Z96611 Presence of right artificial shoulder joint: Secondary | ICD-10-CM | POA: Diagnosis not present

## 2016-09-17 ENCOUNTER — Encounter: Payer: Self-pay | Admitting: Physical Therapy

## 2016-09-17 ENCOUNTER — Ambulatory Visit: Payer: Medicare Other | Admitting: Physical Therapy

## 2016-09-17 DIAGNOSIS — M25611 Stiffness of right shoulder, not elsewhere classified: Secondary | ICD-10-CM

## 2016-09-17 DIAGNOSIS — M25511 Pain in right shoulder: Secondary | ICD-10-CM | POA: Diagnosis not present

## 2016-09-17 NOTE — Therapy (Signed)
[facility] [place], [place], [address] Phone: [phone]   Fax:  [phone]  Physical Therapy Treatment  Patient Details  Name: Diana Pratt MRN: [id number] Date of Birth: August 22, 1946 Referring Provider: Justice Britain  Encounter Date: 09/17/2016      PT End of Session - 09/17/16 1128    Visit Number 23   Number of Visits 24   Date for PT Re-Evaluation 09/07/16   PT Start Time 1125   PT Stop Time 1206   PT Time Calculation (min) 41 min   Activity Tolerance Patient tolerated treatment well   Behavior During Therapy [facility] for tasks assessed/performed      Past Medical History:  Diagnosis Date  . Anxiety   . Asthma   . Back pain   . 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 COPD (chronic obstructive pulmonary disease) ([place])    dr. Kenn File  . Depression   . Diabetes mellitus   . Diabetic neuropathy ([place])   . GERD (gastroesophageal reflux disease)    occ heartburn  . Humerus fracture    right  . Hyperlipemia   . Hypertension   . Interstitial cystitis   . Osteoporosis   . Pneumonia    15 yrs ago  . PONV (postoperative nausea and vomiting)   . Shortness of breath dyspnea     Past Surgical History:  Procedure Laterality Date  . ABDOMINAL HYSTERECTOMY  03/1984   partial  . COLONOSCOPY N/A 07/19/2014   Procedure: COLONOSCOPY;  Surgeon: Danie Binder, MD;  [facility];  Service: Endoscopy;  Laterality: N/A;  9:30  . REVERSE SHOULDER ARTHROPLASTY Right 06/14/2016   Procedure: REVERSE SHOULDER ARTHROPLASTY;  Surgeon: Justice Britain, MD;  Location: [facility];  Service: Orthopedics;  Laterality: Right;    There were no vitals filed for this visit.      Subjective Assessment - 09/17/16 1127    Subjective Reports that MD told her her shoulder is doing good and to continue for another month. Reports that her shoulder is still sore and MD said that is part of the course.    Pertinent History DM, OP, COPD   Limitations Lifting   Patient Stated Goals get over this pain and get back to using this arm   Currently in Pain? Yes   Pain Score 3    Pain Location Shoulder   Pain Orientation Right   Pain Descriptors / Indicators Sore   Pain Type Surgical pain   Pain Onset More than a month ago            [facility] PT Assessment - 09/17/16 0001      Assessment   Medical Diagnosis s/p R Reverse TSA   Onset Date/Surgical Date 06/14/16   Next MD Visit 12/2016   Prior Therapy no     Precautions   Precautions Shoulder   Precaution Comments SEE PROTOCOL                     [facility] Adult PT Treatment/Exercise - 09/17/16 0001      Shoulder Exercises: Supine   Flexion AROM;Right   Flexion Limitations 3x10 reps     Shoulder Exercises: Sidelying   External Rotation AROM;Right   External Rotation Limitations 3x10 reps   Flexion AROM;Right;Other (comment)  3x10 reps     Shoulder Exercises: Pulleys   Flexion Other (comment)  x4 min   Other Pulley  Exercises seated UE ranger for flexion, abd/add and small circles x20 reps     Shoulder Exercises: ROM/Strengthening   Rhythmic Stabilization, Supine R shoulder rhythmic stabilizations in ER, flex at 60 deg and 90 deg to promote increased stability     Modalities   Modalities Electrical Stimulation;Moist Heat     Moist Heat Therapy   Number Minutes Moist Heat 15 Minutes   Moist Heat Location Shoulder     Electrical Stimulation   Electrical Stimulation Location R shoulder    Electrical Stimulation Action Pre-Mod   Electrical Stimulation Parameters 80-150 hz x15 min   Electrical Stimulation Goals Pain                  PT Short Term Goals - 09/10/16 1512      PT SHORT TERM GOAL #1   Title Improve R shoulder ER to 20 degrees   Time 4   Period Weeks   Status Achieved     PT SHORT TERM GOAL #2   Title decrease R shoulder pain to 5/10.   Time 4   Period Weeks   Status Achieved   3-4/10 at most per patient 09/10/16           PT Long Term Goals - 07/16/16 1055      PT LONG TERM GOAL #1   Title I with HEP   Time 8   Period Weeks   Status On-going     PT LONG TERM GOAL #2   Title Improved R shoulder ROM to [facility]   Time 8   Period Weeks   Status On-going     PT LONG TERM GOAL #3   Title Decreased pain in R shoulder to 2-3/10 with functional activities   Time 8   Period Weeks   Status On-going     PT LONG TERM GOAL #4   Title R shoulder strength 4+/5 or better to improve function (Not to be addressed until okayed by protocol/MD)   Time 8   Period Weeks   Status On-going               Plan - 09/17/16 1158    Clinical Impression Statement Patient tolerated today's treatment well with improvements noted especially with supine R shoulder flexion. Patient reported having more discomfort with sidelying R shoulder flexion. Patient continues to require more multimodal cueing for proper technique of sidelying exercises especially ER. Rhythmic stabilization of R shoulder completed today in both ER and flexion at 60 deg and 90 deg. Normal modalities response noted following removal of the modalities.   Rehab Potential Excellent   Clinical Impairments Affecting Rehab Potential 06/14/16 surgury/ current 13 week 09/13/16   PT Frequency 3x / week   PT Duration 8 weeks   PT Treatment/Interventions ADLs/Self Care Home Management;Electrical Stimulation;Patient/family education;Neuromuscular re-education;Therapeutic exercise;Manual techniques;Passive range of motion;Vasopneumatic Device   PT Next Visit Plan cont with POC per protocol given by MD (see binder) MD. Begin attempting lawnchair progression for flexion.   PT Home Exercise Plan elbow, wrist ROM and hand squeezes   Consulted and Agree with Plan of Care Patient      Patient will benefit from skilled therapeutic intervention in order to improve the following deficits and impairments:  Decreased range of  motion, Impaired UE functional use, Pain, Decreased strength  Visit Diagnosis: Right shoulder pain, unspecified chronicity  Stiffness of right shoulder, not elsewhere classified     Problem List Patient Active Problem List   Diagnosis Date Noted  .  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 Abdominal pain, acute, bilateral lower quadrant 07/01/2014  . Transaminitis 07/01/2014  . COPD (chronic obstructive pulmonary disease) ([place])   . Asthma   . Interstitial cystitis   . Unspecified vitamin D deficiency 05/29/2013  . Lumbar radiculopathy 05/29/2013  . Back strain 05/29/2013  . Unspecified asthma(493.90) 02/27/2013  . Depression with anxiety 02/22/2011  . Uncontrolled type 2 diabetes mellitus with insulin therapy ([place]) 02/22/2011  . Hyperlipemia 02/22/2011  . HTN (hypertension) 02/22/2011   Ahmed Prima, PTA 09/17/16 12:11 PM  [facility] [address] [place], [place], [address] Phone: [phone]   Fax:  [phone]  Name: VALERIYA AUTON MRN: [id number] Date of Birth: 1945/12/13

## 2016-09-20 ENCOUNTER — Ambulatory Visit: Payer: Medicare Other | Admitting: *Deleted

## 2016-09-20 DIAGNOSIS — M25511 Pain in right shoulder: Secondary | ICD-10-CM | POA: Diagnosis not present

## 2016-09-20 DIAGNOSIS — M25611 Stiffness of right shoulder, not elsewhere classified: Secondary | ICD-10-CM

## 2016-09-20 NOTE — Therapy (Signed)
[facility] [place], [place], [address] Phone: [phone]   Fax:  [phone]  Physical Therapy Treatment  Patient Details  Name: Diana Pratt MRN: [id number] Date of Birth: January 17, 1946 Referring Provider: Justice Britain  Encounter Date: 09/20/2016      PT End of Session - 09/20/16 1440    Visit Number 24   Number of Visits 24   Date for PT Re-Evaluation 11/06/16   PT Start Time 1430   PT Stop Time 1529   PT Time Calculation (min) 59 min      Past Medical History:  Diagnosis Date  . Anxiety   . Asthma   . Back pain   . 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 COPD (chronic obstructive pulmonary disease) ([place])    dr. Kenn File  . Depression   . Diabetes mellitus   . Diabetic neuropathy ([place])   . GERD (gastroesophageal reflux disease)    occ heartburn  . Humerus fracture    right  . Hyperlipemia   . Hypertension   . Interstitial cystitis   . Osteoporosis   . Pneumonia    15 yrs ago  . PONV (postoperative nausea and vomiting)   . Shortness of breath dyspnea     Past Surgical History:  Procedure Laterality Date  . ABDOMINAL HYSTERECTOMY  03/1984   partial  . COLONOSCOPY N/A 07/19/2014   Procedure: COLONOSCOPY;  Surgeon: Danie Binder, MD;  [facility];  Service: Endoscopy;  Laterality: N/A;  9:30  . REVERSE SHOULDER ARTHROPLASTY Right 06/14/2016   Procedure: REVERSE SHOULDER ARTHROPLASTY;  Surgeon: Justice Britain, MD;  Location: [facility];  Service: Orthopedics;  Laterality: Right;    There were no vitals filed for this visit.      Subjective Assessment - 09/20/16 1434    Subjective Reports that MD told her her shoulder is doing good and to continue for another month. Reports that her shoulder is still sore and MD said that is part of the course.   Patient is accompained by: Family member   Pertinent History DM, OP, COPD   Limitations Lifting   Currently  in Pain? Yes   Pain Score 3    Pain Location Shoulder   Pain Orientation Right   Pain Descriptors / Indicators Sore   Pain Onset More than a month ago   Pain Frequency Constant                         [facility] Adult PT Treatment/Exercise - 09/20/16 0001      Shoulder Exercises: Supine   Flexion AROM;Right;10 reps;20 reps   Flexion Limitations 3x10 reps     Shoulder Exercises: Sidelying   External Rotation AROM;Right   External Rotation Limitations 3x10 reps   Flexion AROM;Right;Other (comment)  3x10 reps     Shoulder Exercises: Pulleys   Flexion Other (comment)  x5 min   Other Pulley Exercises seated UE ranger for flexion, abd/add and small circles x20 reps                  PT Short Term Goals - 09/10/16 1512      PT SHORT TERM GOAL #1   Title Improve R shoulder ER to 20 degrees   Time 4   Period Weeks   Status Achieved     PT SHORT TERM GOAL #2   Title decrease R shoulder  pain to 5/10.   Time 4   Period Weeks   Status Achieved  3-4/10 at most per patient 09/10/16           PT Long Term Goals - 07/16/16 1055      PT LONG TERM GOAL #1   Title I with HEP   Time 8   Period Weeks   Status On-going     PT LONG TERM GOAL #2   Title Improved R shoulder ROM to [facility]   Time 8   Period Weeks   Status On-going     PT LONG TERM GOAL #3   Title Decreased pain in R shoulder to 2-3/10 with functional activities   Time 8   Period Weeks   Status On-going     PT LONG TERM GOAL #4   Title R shoulder strength 4+/5 or better to improve function (Not to be addressed until okayed by protocol/MD)   Time 8   Period Weeks   Status On-going               Plan - 09/20/16 1443    Clinical Impression Statement Pt still has some soreness and weakness in RT shldr, but continues to improve with AROM and AAROM. She was able to get AAROM to flexion in supine to 100 today. ER AROM and strength is still  the most challenging. She did well with  seated AAROM for flexion.   Rehab Potential Excellent   Clinical Impairments Affecting Rehab Potential 06/14/16 surgury/ current 13 week 09/13/16   PT Frequency 3x / week   PT Duration 8 weeks   PT Treatment/Interventions ADLs/Self Care Home Management;Electrical Stimulation;Patient/family education;Neuromuscular re-education;Therapeutic exercise;Manual techniques;Passive range of motion;Vasopneumatic Device   PT Next Visit Plan cont with POC per protocol given by MD (see binder) MD. Begin attempting lawnchair progression for flexion.   PT Home Exercise Plan elbow, wrist ROM and hand squeezes   Consulted and Agree with Plan of Care Patient      Patient will benefit from skilled therapeutic intervention in order to improve the following deficits and impairments:  Decreased range of motion, Impaired UE functional use, Pain, Decreased strength  Visit Diagnosis: Right shoulder pain, unspecified chronicity  Stiffness of right shoulder, not elsewhere classified     Problem List Patient Active Problem List   Diagnosis Date Noted  . 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 Abdominal pain, acute, bilateral lower quadrant 07/01/2014  . Transaminitis 07/01/2014  . COPD (chronic obstructive pulmonary disease) ([place])   . Asthma   . Interstitial cystitis   . Unspecified vitamin D deficiency 05/29/2013  . Lumbar radiculopathy 05/29/2013  . Back strain 05/29/2013  . Unspecified asthma(493.90) 02/27/2013  . Depression with anxiety 02/22/2011  . Uncontrolled type 2 diabetes mellitus with insulin therapy ([place]) 02/22/2011  . Hyperlipemia 02/22/2011  . HTN (hypertension) 02/22/2011    Samanatha Brammer,CHRIS, PTA 09/20/2016, 3:35 PM  [facility] [place], [place], [address] Phone: [phone]   Fax:  [phone]  Name: Diana Pratt MRN: [id number] Date of Birth: [birth year]

## 2016-09-24 ENCOUNTER — Encounter: Payer: Self-pay | Admitting: Physical Therapy

## 2016-09-24 ENCOUNTER — Ambulatory Visit: Payer: Medicare Other | Admitting: Physical Therapy

## 2016-09-24 ENCOUNTER — Other Ambulatory Visit: Payer: Self-pay | Admitting: Family Medicine

## 2016-09-24 DIAGNOSIS — M25511 Pain in right shoulder: Secondary | ICD-10-CM | POA: Diagnosis not present

## 2016-09-24 DIAGNOSIS — M25611 Stiffness of right shoulder, not elsewhere classified: Secondary | ICD-10-CM

## 2016-09-24 NOTE — Therapy (Signed)
[facility] [place], [place], [address] Phone: [phone]   Fax:  [phone]  Physical Therapy Treatment  Patient Details  Name: Diana Pratt MRN: [id number] Date of Birth: 1946/05/02 Referring Provider: Justice Britain  Encounter Date: 09/24/2016      PT End of Session - 09/24/16 1150    Visit Number 25   Number of Visits 24   Date for PT Re-Evaluation 11/06/16   PT Start Time 1121   PT Stop Time 1200   PT Time Calculation (min) 39 min   Activity Tolerance Patient tolerated treatment well   Behavior During Therapy [facility] for tasks assessed/performed      Past Medical History:  Diagnosis Date  . Anxiety   . Asthma   . Back pain   . 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 COPD (chronic obstructive pulmonary disease) ([place])    dr. Kenn File  . Depression   . Diabetes mellitus   . Diabetic neuropathy ([place])   . GERD (gastroesophageal reflux disease)    occ heartburn  . Humerus fracture    right  . Hyperlipemia   . Hypertension   . Interstitial cystitis   . Osteoporosis   . Pneumonia    15 yrs ago  . PONV (postoperative nausea and vomiting)   . Shortness of breath dyspnea     Past Surgical History:  Procedure Laterality Date  . ABDOMINAL HYSTERECTOMY  03/1984   partial  . COLONOSCOPY N/A 07/19/2014   Procedure: COLONOSCOPY;  Surgeon: Danie Binder, MD;  [facility];  Service: Endoscopy;  Laterality: N/A;  9:30  . REVERSE SHOULDER ARTHROPLASTY Right 06/14/2016   Procedure: REVERSE SHOULDER ARTHROPLASTY;  Surgeon: Justice Britain, MD;  Location: [place];  Service: Orthopedics;  Laterality: Right;    There were no vitals filed for this visit.      Subjective Assessment - 09/24/16 1127    Subjective Patient reported ongoing soreness in shoulder   Pertinent History DM, OP, COPD   Limitations Lifting   Patient Stated Goals get over this pain and get back to  using this arm   Currently in Pain? Yes   Pain Score 3    Pain Location Shoulder   Pain Orientation Right   Pain Descriptors / Indicators Sore   Pain Type Surgical pain   Pain Onset More than a month ago   Pain Frequency Constant   Aggravating Factors  prolong movement or use   Pain Relieving Factors at rest            [facility] PT Assessment - 09/24/16 0001      PROM   PROM Assessment Site Shoulder   Right/Left Shoulder Right   Right Shoulder Flexion 115 Degrees   Right Shoulder External Rotation 35 Degrees                     [facility] Adult PT Treatment/Exercise - 09/24/16 0001      Shoulder Exercises: Supine   Protraction AROM;Strengthening;Right;10 reps   Flexion AAROM;Strengthening;Right  manual assitance for eccentric lowering in lawnchair positio   Flexion Limitations unable to perorm independently     Shoulder Exercises: Pulleys   Flexion --  11min   Other Pulley Exercises seated UE ranger for flexion, abd/add and small circles x20 reps     Moist Heat Therapy   Number Minutes Moist Heat 15 Minutes   Moist Heat Location  Shoulder     Electrical Stimulation   Electrical Stimulation Location R shoulder  premod x 15 mins 80-150hz    Electrical Stimulation Goals Pain     Manual Therapy   Manual Therapy Passive ROM   Passive ROM PROM/ AAROM of R shoulder into flexion and ER with gentle holds at end range. Rhythmic stabs . Contract/relax to facilitate elevation                  PT Short Term Goals - 09/10/16 1512      PT SHORT TERM GOAL #1   Title Improve R shoulder ER to 20 degrees   Time 4   Period Weeks   Status Achieved     PT SHORT TERM GOAL #2   Title decrease R shoulder pain to 5/10.   Time 4   Period Weeks   Status Achieved  3-4/10 at most per patient 09/10/16           PT Long Term Goals - 07/16/16 1055      PT LONG TERM GOAL #1   Title I with HEP   Time 8   Period Weeks   Status On-going     PT LONG TERM GOAL #2    Title Improved R shoulder ROM to [facility]   Time 8   Period Weeks   Status On-going     PT LONG TERM GOAL #3   Title Decreased pain in R shoulder to 2-3/10 with functional activities   Time 8   Period Weeks   Status On-going     PT LONG TERM GOAL #4   Title R shoulder strength 4+/5 or better to improve function (Not to be addressed until okayed by protocol/MD)   Time 8   Period Weeks   Status On-going               Plan - 09/24/16 1154    Clinical Impression Statement Patient progressing slwoly due to pain, strength and ROM limitations. Patient improving ROM in right shoulder. Patient did well with lawnchair position for shoulder flexion yet required manual assistance due to weakness. Focused on eccentric lowering. Patient current goals ongoing.    Rehab Potential Excellent   Clinical Impairments Affecting Rehab Potential 06/14/16 surgury/ current 13 week 09/13/16   PT Frequency 3x / week   PT Duration 8 weeks   PT Treatment/Interventions ADLs/Self Care Home Management;Electrical Stimulation;Patient/family education;Neuromuscular re-education;Therapeutic exercise;Manual techniques;Passive range of motion;Vasopneumatic Device   PT Next Visit Plan cont with POC per protocol given by MD (see binder) MD. Begin attempting lawnchair progression for flexion. awaiting updated order   Consulted and Agree with Plan of Care Patient      Patient will benefit from skilled therapeutic intervention in order to improve the following deficits and impairments:  Decreased range of motion, Impaired UE functional use, Pain, Decreased strength  Visit Diagnosis: Right shoulder pain, unspecified chronicity  Stiffness of right shoulder, not elsewhere classified  Acute pain of right shoulder     Problem List Patient Active Problem List   Diagnosis Date Noted  . 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 Abdominal pain, acute, bilateral lower quadrant 07/01/2014  . Transaminitis  07/01/2014  . COPD (chronic obstructive pulmonary disease) ([place])   . Asthma   . Interstitial cystitis   . Unspecified vitamin D deficiency 05/29/2013  . Lumbar radiculopathy 05/29/2013  . Back strain 05/29/2013  . Unspecified asthma(493.90) 02/27/2013  . Depression with anxiety 02/22/2011  . Uncontrolled type 2  diabetes mellitus with insulin therapy ([place]) 02/22/2011  . Hyperlipemia 02/22/2011  . HTN (hypertension) 02/22/2011    Drew Lips P, PTA 09/24/2016, 12:05 PM  [facility] [place], [place], [address] Phone: [phone]   Fax:  [phone]  Name: Diana Pratt MRN: [id number] Date of Birth: 1946/05/07

## 2016-10-01 ENCOUNTER — Ambulatory Visit: Payer: Medicare Other | Admitting: Physical Therapy

## 2016-10-01 ENCOUNTER — Encounter: Payer: Self-pay | Admitting: Physical Therapy

## 2016-10-01 DIAGNOSIS — M25511 Pain in right shoulder: Secondary | ICD-10-CM | POA: Diagnosis not present

## 2016-10-01 DIAGNOSIS — M25611 Stiffness of right shoulder, not elsewhere classified: Secondary | ICD-10-CM | POA: Diagnosis not present

## 2016-10-01 NOTE — Patient Instructions (Addendum)
Scapular Retraction: Bilateral    Facing anchor, pull arms back, bringing shoulder blades together. DO NOT ROTATE YOUR CHEST, MOVEMENT SHOULD ONLY BE IN YOUR ARM. Repeat __10__ times per set. Do __2__ sets per session. Do __2-3__ sessions per day.  http://orth.exer.us/176   Copyright  VHI. All rights reserved.  Strengthening: Resisted Internal Rotation    Hold tubing in right hand, elbow at side and forearm out. Rotate forearm in across body. DO NOT ROTATE YOUR CHEST, MOVEMENT SHOULD ONLY BE IN YOUR ARM. Repeat _10___ times per set. Do _2___ sets per session. Do __2-3__ sessions per day.  http://orth.exer.us/830   Copyright  VHI. All rights reserved.  Strengthening: Resisted Extension    Hold tubing in right hand, arm forward. Pull arm back, elbow straight. DO NOT ROTATE YOUR CHEST, MOVEMENT SHOULD BE ONLY IN YOUR ARM. Repeat _10___ times per set. Do _2___ sets per session. Do __2-3__ sessions per day.  http://orth.exer.us/832   Copyright  VHI. All rights reserved.  Progressive Resisted: External Rotation (Side-Lying)    Place a towel under arm, raise right forearm toward ceiling. Keep elbow bent and at side. Repeat ___10_ times per set. Do __3__ sets per session. Do __2-3__ sessions per day.  http://orth.exer.us/878   Copyright  VHI. All rights reserved.

## 2016-10-01 NOTE — Therapy (Signed)
[facility] [place], [place], [address] Phone: [phone]   Fax:  [phone]  Physical Therapy Treatment  Patient Details  Name: Diana Pratt MRN: [id number] Date of Birth: 26-Feb-1946 Referring Provider: Justice Britain  Encounter Date: 10/01/2016      PT End of Session - 10/01/16 1123    Visit Number 26   Number of Visits 24   Date for PT Re-Evaluation 11/06/16   PT Start Time 1122   PT Stop Time 1205   PT Time Calculation (min) 43 min   Activity Tolerance Patient tolerated treatment well   Behavior During Therapy [facility] for tasks assessed/performed      Past Medical History:  Diagnosis Date  . Anxiety   . Asthma   . Back pain   . 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 COPD (chronic obstructive pulmonary disease) ([place])    dr. Kenn File  . Depression   . Diabetes mellitus   . Diabetic neuropathy ([place])   . GERD (gastroesophageal reflux disease)    occ heartburn  . Humerus fracture    right  . Hyperlipemia   . Hypertension   . Interstitial cystitis   . Osteoporosis   . Pneumonia    15 yrs ago  . PONV (postoperative nausea and vomiting)   . Shortness of breath dyspnea     Past Surgical History:  Procedure Laterality Date  . ABDOMINAL HYSTERECTOMY  03/1984   partial  . COLONOSCOPY N/A 07/19/2014   Procedure: COLONOSCOPY;  Surgeon: Danie Binder, MD;  [facility];  Service: Endoscopy;  Laterality: N/A;  9:30  . REVERSE SHOULDER ARTHROPLASTY Right 06/14/2016   Procedure: REVERSE SHOULDER ARTHROPLASTY;  Surgeon: Justice Britain, MD;  Location: [place];  Service: Orthopedics;  Laterality: Right;    There were no vitals filed for this visit.      Subjective Assessment - 10/01/16 1122    Subjective Reports that shoulder is still sore.   Pertinent History DM, OP, COPD   Limitations Lifting   Patient Stated Goals get over this pain and get back to using this  arm   Currently in Pain? Yes   Pain Score 3    Pain Location Shoulder   Pain Orientation Right   Pain Descriptors / Indicators Sore   Pain Type Surgical pain   Pain Onset More than a month ago            [facility] PT Assessment - 10/01/16 0001      Assessment   Medical Diagnosis s/p R Reverse TSA   Onset Date/Surgical Date 06/14/16   Hand Dominance Right   Next MD Visit 12/2016   Prior Therapy no     Precautions   Precautions Shoulder   Precaution Comments SEE PROTOCOL                     [facility] Adult PT Treatment/Exercise - 10/01/16 0001      Shoulder Exercises: Supine   Protraction AROM;Right;20 reps   Flexion AROM;Right;20 reps  reclined for lawnchair progression     Shoulder Exercises: Seated   External Rotation AROM;Right;20 reps   External Rotation Limitations with tactile and VCs for proper technique     Shoulder Exercises: Standing   External Rotation Other (comment)  Attempted but ER still very limited and weak    Internal Rotation Strengthening;Right;20 reps;Theraband   Theraband Level (Shoulder Internal Rotation) Level  1 (Yellow)   Extension Strengthening;Right;20 reps;Theraband   Theraband Level (Shoulder Extension) Level 1 (Yellow)   Row Strengthening;Right;20 reps;Theraband   Theraband Level (Shoulder Row) Level 1 (Yellow)   Other Standing Exercises Standing RUE ranger flex/circles x20 reps each   Other Standing Exercises RUE wall ladder x15 reps     Shoulder Exercises: Pulleys   Flexion Other (comment)  x5 min     Shoulder Exercises: ROM/Strengthening   Rhythmic Stabilization, Supine R shoulder rhythmic stabilizations in ER, flex at 60 deg to promote increased stability in reclined position     Modalities   Modalities Electrical Stimulation;Moist Heat     Moist Heat Therapy   Number Minutes Moist Heat 15 Minutes   Moist Heat Location Shoulder     Electrical Stimulation   Electrical Stimulation Location R shoulder   Electrical  Stimulation Action Pre-Mod   Electrical Stimulation Parameters 80-150 hz x15 min   Electrical Stimulation Goals Pain                PT Education - 10/01/16 1156    Education provided Yes   Education Details HEP- resisted row, ext, IR and SL IR without weight   Person(s) Educated Patient   Methods Explanation;Demonstration;Verbal cues;Handout   Comprehension Verbalized understanding;Returned demonstration;Verbal cues required          PT Short Term Goals - 09/10/16 1512      PT SHORT TERM GOAL #1   Title Improve R shoulder ER to 20 degrees   Time 4   Period Weeks   Status Achieved     PT SHORT TERM GOAL #2   Title decrease R shoulder pain to 5/10.   Time 4   Period Weeks   Status Achieved  3-4/10 at most per patient 09/10/16           PT Long Term Goals - 10/01/16 1123      PT LONG TERM GOAL #1   Title I with HEP   Time 8   Period Weeks   Status Achieved     PT LONG TERM GOAL #2   Title Improved R shoulder ROM to [facility]   Time 8   Period Weeks   Status On-going     PT LONG TERM GOAL #3   Title Decreased pain in R shoulder to 2-3/10 with functional activities   Time 8   Period Weeks   Status On-going     PT LONG TERM GOAL #4   Title R shoulder strength 4+/5 or better to improve function (Not to be addressed until okayed by protocol/MD)   Time 8   Period Weeks   Status On-going               Plan - 10/01/16 1158    Clinical Impression Statement Patient able to progress today with UE ranger and wall ladder. Patient also able to progress per protocol with R shoulder strengthening with yellow theraband. Patient attempted resisted ER but unable to complete secondary to ROM limitation as well as weakness with R shoulder ER. Patient required tactile and verbal cueing for proper technique. Lawnchair progression continued with good ROM noted with reclined flexion. Fatigue was notable upon observation with reclined flexion and protraction. Normal  modalities response noted following removal of the modalities. Patient provided new strengthening HEP with yellow theraband with cueing to avoid trunk rotation to improve strengthening and ROM.    Rehab Potential Excellent   Clinical Impairments Affecting Rehab Potential 06/14/16 surgury/ current 13  week 09/13/16   PT Frequency 3x / week   PT Duration 8 weeks   PT Treatment/Interventions ADLs/Self Care Home Management;Electrical Stimulation;Patient/family education;Neuromuscular re-education;Therapeutic exercise;Manual techniques;Passive range of motion;Vasopneumatic Device   PT Next Visit Plan cont with POC per protocol given by MD (see binder) MD. Begin attempting lawnchair progression for flexion. awaiting updated order   PT Home Exercise Plan elbow, wrist ROM and hand squeezes   Consulted and Agree with Plan of Care Patient      Patient will benefit from skilled therapeutic intervention in order to improve the following deficits and impairments:  Decreased range of motion, Impaired UE functional use, Pain, Decreased strength  Visit Diagnosis: Right shoulder pain, unspecified chronicity  Stiffness of right shoulder, not elsewhere classified     Problem List Patient Active Problem List   Diagnosis Date Noted  . 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 Abdominal pain, acute, bilateral lower quadrant 07/01/2014  . Transaminitis 07/01/2014  . COPD (chronic obstructive pulmonary disease) ([place])   . Asthma   . Interstitial cystitis   . Unspecified vitamin D deficiency 05/29/2013  . Lumbar radiculopathy 05/29/2013  . Back strain 05/29/2013  . Unspecified asthma(493.90) 02/27/2013  . Depression with anxiety 02/22/2011  . Uncontrolled type 2 diabetes mellitus with insulin therapy ([place]) 02/22/2011  . Hyperlipemia 02/22/2011  . HTN (hypertension) 02/22/2011    Wynelle Fanny, PTA 10/01/2016, 12:10 PM  [facility] [address] [place], [place], [address] Phone: [phone]   Fax:  [phone]  Name: NORVA PARGA MRN: [id number] Date of Birth: [birth year]

## 2016-10-04 ENCOUNTER — Encounter: Payer: Self-pay | Admitting: Physical Therapy

## 2016-10-04 ENCOUNTER — Ambulatory Visit: Payer: Medicare Other | Admitting: Physical Therapy

## 2016-10-04 DIAGNOSIS — M25511 Pain in right shoulder: Secondary | ICD-10-CM | POA: Diagnosis not present

## 2016-10-04 DIAGNOSIS — M25611 Stiffness of right shoulder, not elsewhere classified: Secondary | ICD-10-CM | POA: Diagnosis not present

## 2016-10-04 NOTE — Therapy (Signed)
[facility] [place], [place], [address] Phone: [phone]   Fax:  [phone]  Physical Therapy Treatment  Patient Details  Name: Diana Pratt MRN: [id number] Date of Birth: 1946-01-06 Referring Provider: Justice Britain  Encounter Date: 10/04/2016      PT End of Session - 10/04/16 1123    Visit Number 27   Number of Visits 24   Date for PT Re-Evaluation 11/06/16   PT Start Time 1122   PT Stop Time 1207   PT Time Calculation (min) 45 min   Activity Tolerance Patient tolerated treatment well   Behavior During Therapy [facility] for tasks assessed/performed      Past Medical History:  Diagnosis Date  . Anxiety   . Asthma   . Back pain   . 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 COPD (chronic obstructive pulmonary disease) ([place])    dr. Kenn File  . Depression   . Diabetes mellitus   . Diabetic neuropathy ([place])   . GERD (gastroesophageal reflux disease)    occ heartburn  . Humerus fracture    right  . Hyperlipemia   . Hypertension   . Interstitial cystitis   . Osteoporosis   . Pneumonia    15 yrs ago  . PONV (postoperative nausea and vomiting)   . Shortness of breath dyspnea     Past Surgical History:  Procedure Laterality Date  . ABDOMINAL HYSTERECTOMY  03/1984   partial  . COLONOSCOPY N/A 07/19/2014   Procedure: COLONOSCOPY;  Surgeon: Danie Binder, MD;  [facility];  Service: Endoscopy;  Laterality: N/A;  9:30  . REVERSE SHOULDER ARTHROPLASTY Right 06/14/2016   Procedure: REVERSE SHOULDER ARTHROPLASTY;  Surgeon: Justice Britain, MD;  Location: [place];  Service: Orthopedics;  Laterality: Right;    There were no vitals filed for this visit.      Subjective Assessment - 10/04/16 1122    Subjective Reports continued R shoulder soreness.   Pertinent History DM, OP, COPD   Limitations Lifting   Patient Stated Goals get over this pain and get back to using this  arm   Currently in Pain? Yes   Pain Score 3    Pain Location Shoulder   Pain Orientation Right   Pain Descriptors / Indicators Sore   Pain Type Surgical pain   Pain Onset More than a month ago   Pain Frequency Constant            [facility] PT Assessment - 10/04/16 0001      Assessment   Medical Diagnosis s/p R Reverse TSA   Onset Date/Surgical Date 06/14/16   Hand Dominance Right   Next MD Visit 12/2016   Prior Therapy no     Precautions   Precautions Shoulder   Precaution Comments SEE PROTOCOL                     [facility] Adult PT Treatment/Exercise - 10/04/16 0001      Shoulder Exercises: Supine   Flexion AROM;Right;20 reps  Reclined with wedge; reported feeling something moving      Shoulder Exercises: Sidelying   External Rotation AROM;Right  3x10 reps     Shoulder Exercises: Standing   Internal Rotation Strengthening;Right;20 reps;Theraband   Theraband Level (Shoulder Internal Rotation) Level 1 (Yellow)   Extension Strengthening;Right;20 reps;Theraband   Theraband Level (Shoulder Extension) Level 1 (Yellow)   Row Strengthening;Right;20 reps;Theraband  Theraband Level (Shoulder Row) Level 1 (Yellow)   Other Standing Exercises Standing RUE ranger flex/circles x20 reps each   Other Standing Exercises RUE wall ladder x15 reps     Shoulder Exercises: Pulleys   Flexion Other (comment)  x5 min     Modalities   Modalities Electrical Stimulation;Moist Heat     Moist Heat Therapy   Number Minutes Moist Heat 15 Minutes   Moist Heat Location Shoulder     Electrical Stimulation   Electrical Stimulation Location R shoulder   Electrical Stimulation Action Pre-Mod   Electrical Stimulation Parameters 80-150  hz x15 min   Electrical Stimulation Goals Pain     Manual Therapy   Manual Therapy Soft tissue mobilization   Soft tissue mobilization STW to R deltoids, bicep to decrease soreness                  PT Short Term Goals - 09/10/16 1512       PT SHORT TERM GOAL #1   Title Improve R shoulder ER to 20 degrees   Time 4   Period Weeks   Status Achieved     PT SHORT TERM GOAL #2   Title decrease R shoulder pain to 5/10.   Time 4   Period Weeks   Status Achieved  3-4/10 at most per patient 09/10/16           PT Long Term Goals - 10/01/16 1123      PT LONG TERM GOAL #1   Title I with HEP   Time 8   Period Weeks   Status Achieved     PT LONG TERM GOAL #2   Title Improved R shoulder ROM to [facility]   Time 8   Period Weeks   Status On-going     PT LONG TERM GOAL #3   Title Decreased pain in R shoulder to 2-3/10 with functional activities   Time 8   Period Weeks   Status On-going     PT LONG TERM GOAL #4   Title R shoulder strength 4+/5 or better to improve function (Not to be addressed until okayed by protocol/MD)   Time 8   Period Weeks   Status On-going               Plan - 10/04/16 1156    Clinical Impression Statement Patient arrived to treatment today with continued 3/10 R shoulder soreness. Patient able to complete all standing exercises fairly well although patient limited in flexion with UE ranger and limited with resisted row and extension secondary to R upper arm soreness per patient report. Patient observed as limited with R shoulder extension with resisted exercises. Patient limited more today in reclined AROM flexion. A two finger width depression noted in R shoulder that inferior region of depression is firm in nature. Normal modalities response noted following removal of the modalities.    Rehab Potential Excellent   Clinical Impairments Affecting Rehab Potential 06/14/16 surgury/ current 13 week 09/13/16   PT Frequency 3x / week   PT Duration 8 weeks   PT Treatment/Interventions ADLs/Self Care Home Management;Electrical Stimulation;Patient/family education;Neuromuscular re-education;Therapeutic exercise;Manual techniques;Passive range of motion;Vasopneumatic Device   PT Next Visit Plan cont  with POC per protocol given by MD (see binder) MD. Begin attempting lawnchair progression for flexion. awaiting updated order   PT Home Exercise Plan elbow, wrist ROM and hand squeezes   Consulted and Agree with Plan of Care Patient      Patient will  benefit from skilled therapeutic intervention in order to improve the following deficits and impairments:  Decreased range of motion, Impaired UE functional use, Pain, Decreased strength  Visit Diagnosis: Right shoulder pain, unspecified chronicity  Stiffness of right shoulder, not elsewhere classified     Problem List Patient Active Problem List   Diagnosis Date Noted  . 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 Abdominal pain, acute, bilateral lower quadrant 07/01/2014  . Transaminitis 07/01/2014  . COPD (chronic obstructive pulmonary disease) ([place])   . Asthma   . Interstitial cystitis   . Unspecified vitamin D deficiency 05/29/2013  . Lumbar radiculopathy 05/29/2013  . Back strain 05/29/2013  . Unspecified asthma(493.90) 02/27/2013  . Depression with anxiety 02/22/2011  . Uncontrolled type 2 diabetes mellitus with insulin therapy ([place]) 02/22/2011  . Hyperlipemia 02/22/2011  . HTN (hypertension) 02/22/2011    Wynelle Fanny, PTA 10/04/2016, 12:10 PM  [facility] [address] [place], [place], [address] Phone: [phone]   Fax:  [phone]  Name: LAKSHANA DRUMWRIGHT MRN: [id number] Date of Birth: 04/08/1946

## 2016-10-09 ENCOUNTER — Ambulatory Visit: Payer: Medicare Other | Attending: Orthopedic Surgery | Admitting: *Deleted

## 2016-10-09 DIAGNOSIS — M25611 Stiffness of right shoulder, not elsewhere classified: Secondary | ICD-10-CM | POA: Diagnosis not present

## 2016-10-09 DIAGNOSIS — M25511 Pain in right shoulder: Secondary | ICD-10-CM | POA: Diagnosis not present

## 2016-10-09 NOTE — Therapy (Signed)
[facility] [place], [place], [address] Phone: [phone]   Fax:  [phone]  Physical Therapy Treatment  Patient Details  Name: Diana Pratt MRN: [id number] Date of Birth: May 18, 1946 Referring Provider: Justice Britain  Encounter Date: 10/09/2016      PT End of Session - 10/09/16 1056    Visit Number 28   Number of Visits 36   Date for PT Re-Evaluation 11/06/16   PT Start Time 1030   PT Stop Time 1120   PT Time Calculation (min) 50 min      Past Medical History:  Diagnosis Date  . Anxiety   . Asthma   . Back pain   . 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 COPD (chronic obstructive pulmonary disease) ([place])    dr. Kenn File  . Depression   . Diabetes mellitus   . Diabetic neuropathy ([place])   . GERD (gastroesophageal reflux disease)    occ heartburn  . Humerus fracture    right  . Hyperlipemia   . Hypertension   . Interstitial cystitis   . Osteoporosis   . Pneumonia    15 yrs ago  . PONV (postoperative nausea and vomiting)   . Shortness of breath dyspnea     Past Surgical History:  Procedure Laterality Date  . ABDOMINAL HYSTERECTOMY  03/1984   partial  . COLONOSCOPY N/A 07/19/2014   Procedure: COLONOSCOPY;  Surgeon: Danie Binder, MD;  [facility];  Service: Endoscopy;  Laterality: N/A;  9:30  . REVERSE SHOULDER ARTHROPLASTY Right 06/14/2016   Procedure: REVERSE SHOULDER ARTHROPLASTY;  Surgeon: Justice Britain, MD;  Location: [facility];  Service: Orthopedics;  Laterality: Right;    There were no vitals filed for this visit.      Subjective Assessment - 10/09/16 1052    Subjective Reports continued R shoulder soreness.   RT shldr stays weak.   Went to MD 09-05-16   Patient is accompained by: Family member   Pertinent History DM, OP, COPD   Limitations Lifting   Currently in Pain? Yes   Pain Score 3    Pain Location Shoulder   Pain Orientation Right    Pain Descriptors / Indicators Sore   Pain Type Surgical pain   Pain Onset More than a month ago                         [facility] Adult PT Treatment/Exercise - 10/09/16 0001      Shoulder Exercises: Standing   Extension Strengthening;Right;20 reps;Theraband   Theraband Level (Shoulder Extension) Level 1 (Yellow)   Row Strengthening;Right;20 reps;Theraband   Theraband Level (Shoulder Row) Level 1 (Yellow)   Other Standing Exercises Standing RUE ranger flex/circles x20 reps each     Shoulder Exercises: Pulleys   Flexion Other (comment)  x5 min     Shoulder Exercises: ROM/Strengthening   UBE (Upper Arm Bike) x 5 mins 120 RPMs forward     Modalities   Modalities Electrical Stimulation;Moist Heat     Moist Heat Therapy   Number Minutes Moist Heat 15 Minutes   Moist Heat Location Shoulder     Electrical Stimulation   Electrical Stimulation Location R shoulder  premod x 15 mins 80-150hz    Electrical Stimulation Goals Pain     Manual Therapy   Passive ROM AAROM for flexion supine       Bicep curl 1# 3x  10              PT Short Term Goals - 09/10/16 1512      PT SHORT TERM GOAL #1   Title Improve R shoulder ER to 20 degrees   Time 4   Period Weeks   Status Achieved     PT SHORT TERM GOAL #2   Title decrease R shoulder pain to 5/10.   Time 4   Period Weeks   Status Achieved  3-4/10 at most per patient 09/10/16           PT Long Term Goals - 10/01/16 1123      PT LONG TERM GOAL #1   Title I with HEP   Time 8   Period Weeks   Status Achieved     PT LONG TERM GOAL #2   Title Improved R shoulder ROM to [facility]   Time 8   Period Weeks   Status On-going     PT LONG TERM GOAL #3   Title Decreased pain in R shoulder to 2-3/10 with functional activities   Time 8   Period Weeks   Status On-going     PT LONG TERM GOAL #4   Title R shoulder strength 4+/5 or better to improve function (Not to be addressed until okayed by protocol/MD)   Time 8    Period Weeks   Status On-going               Plan - 10/09/16 1314    Clinical Impression Statement Pt did fairly well with therex today to increase RT shldr strength. She is still unable to elevate her RT shldr without componsation. We reviewed HEP and added bicep curls for home with 1# wt Goals are ongoing   Rehab Potential Excellent   Clinical Impairments Affecting Rehab Potential 06/14/16 surgury/ current 13 week 09/13/16   PT Frequency 3x / week   PT Duration 8 weeks   PT Treatment/Interventions ADLs/Self Care Home Management;Electrical Stimulation;Patient/family education;Neuromuscular re-education;Therapeutic exercise;Manual techniques;Passive range of motion;Vasopneumatic Device   PT Next Visit Plan cont with POC per protocol given by MD (see binder) MD. Begin attempting lawnchair progression for flexion. awaiting updated order   PT Home Exercise Plan elbow, wrist ROM and hand squeezes   Consulted and Agree with Plan of Care Patient      Patient will benefit from skilled therapeutic intervention in order to improve the following deficits and impairments:  Decreased range of motion, Impaired UE functional use, Pain, Decreased strength  Visit Diagnosis: Right shoulder pain, unspecified chronicity  Stiffness of right shoulder, not elsewhere classified  Acute pain of right shoulder     Problem List Patient Active Problem List   Diagnosis Date Noted  . 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 Abdominal pain, acute, bilateral lower quadrant 07/01/2014  . Transaminitis 07/01/2014  . COPD (chronic obstructive pulmonary disease) ([place])   . Asthma   . Interstitial cystitis   . Unspecified vitamin D deficiency 05/29/2013  . Lumbar radiculopathy 05/29/2013  . Back strain 05/29/2013  . Unspecified asthma(493.90) 02/27/2013  . Depression with anxiety 02/22/2011  . Uncontrolled type 2 diabetes mellitus with insulin therapy ([place]) 02/22/2011  . Hyperlipemia  02/22/2011  . HTN (hypertension) 02/22/2011    RAMSEUR,CHRIS, PTA 10/09/2016, 1:25 PM  [facility] [address] [place], [place], [address] Phone: [phone]   Fax:  [phone]  Name: Diana Pratt MRN: [id number] Date of Birth: Jun 07, 1946

## 2016-10-11 ENCOUNTER — Ambulatory Visit: Payer: Medicare Other | Admitting: Physical Therapy

## 2016-10-15 ENCOUNTER — Ambulatory Visit: Payer: Medicare Other | Admitting: Physical Therapy

## 2016-10-15 DIAGNOSIS — M25611 Stiffness of right shoulder, not elsewhere classified: Secondary | ICD-10-CM | POA: Diagnosis not present

## 2016-10-15 DIAGNOSIS — M25511 Pain in right shoulder: Secondary | ICD-10-CM

## 2016-10-15 NOTE — Therapy (Signed)
[facility] [place], [place], [address] Phone: [phone]   Fax:  [phone]  Physical Therapy Treatment  Patient Details  Name: Diana Pratt MRN: [id number] Date of Birth: 1946/08/18 Referring Provider: Justice Britain  Encounter Date: 10/15/2016      PT End of Session - 10/15/16 1153    Visit Number 29   Number of Visits 36   Date for PT Re-Evaluation 11/06/16   PT Start Time 1122   PT Stop Time 1207   PT Time Calculation (min) 45 min   Activity Tolerance Patient tolerated treatment well   Behavior During Therapy [facility] for tasks assessed/performed      Past Medical History:  Diagnosis Date  . Anxiety   . Asthma   . Back pain   . 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 COPD (chronic obstructive pulmonary disease) ([place])    dr. Kenn File  . Depression   . Diabetes mellitus   . Diabetic neuropathy ([place])   . GERD (gastroesophageal reflux disease)    occ heartburn  . Humerus fracture    right  . Hyperlipemia   . Hypertension   . Interstitial cystitis   . Osteoporosis   . Pneumonia    15 yrs ago  . PONV (postoperative nausea and vomiting)   . Shortness of breath dyspnea     Past Surgical History:  Procedure Laterality Date  . ABDOMINAL HYSTERECTOMY  03/1984   partial  . COLONOSCOPY N/A 07/19/2014   Procedure: COLONOSCOPY;  Surgeon: Danie Binder, MD;  [facility];  Service: Endoscopy;  Laterality: N/A;  9:30  . REVERSE SHOULDER ARTHROPLASTY Right 06/14/2016   Procedure: REVERSE SHOULDER ARTHROPLASTY;  Surgeon: Justice Britain, MD;  Location: [facility];  Service: Orthopedics;  Laterality: Right;    There were no vitals filed for this visit.      Subjective Assessment - 10/15/16 1126    Subjective Pt reports her shoulder continues with shoulder soreness, especially with cold weather   Limitations Lifting   Patient Stated Goals get over this pain and get back  to using this arm   Pain Score 3    Pain Location Shoulder   Pain Orientation Right   Pain Descriptors / Indicators Sore   Pain Type Surgical pain   Pain Onset More than a month ago                         [facility] Adult PT Treatment/Exercise - 10/15/16 0001      Shoulder Exercises: Standing   External Rotation AAROM;Strengthening;Right;20 reps   Internal Rotation Strengthening;Right;20 reps   Theraband Level (Shoulder Internal Rotation) Level 2 (Red)   Extension Strengthening;Right;20 reps   Theraband Level (Shoulder Extension) Level 2 (Red)   Row Strengthening;Right;20 reps   Theraband Level (Shoulder Row) Level 2 (Red)   Other Standing Exercises Standing UE ranger CW/CCW x 20    Other Standing Exercises wall ladder x 10     Shoulder Exercises: ROM/Strengthening   UBE (Upper Arm Bike) x 5 min alternating fwd/bkwd     Moist Heat Therapy   Number Minutes Moist Heat 15 Minutes   Moist Heat Location Shoulder     Electrical Stimulation   Electrical Stimulation Location Rt shoulder   Electrical Stimulation Action pre mod   Electrical Stimulation Parameters to [place]elrance   Electrical Stimulation Goals Pain     Manual  Therapy   Passive ROM flexion and ER                  PT Short Term Goals - 10/15/16 1154      PT SHORT TERM GOAL #1   Title Improve R shoulder ER to 20 degrees   Status Achieved     PT SHORT TERM GOAL #2   Title decrease R shoulder pain to 5/10.   Status Achieved           PT Long Term Goals - 10/15/16 1155      PT LONG TERM GOAL #1   Title I with HEP   Status Achieved     PT LONG TERM GOAL #2   Title Improved R shoulder ROM to [facility]   Status On-going     PT LONG TERM GOAL #3   Title Decreased pain in R shoulder to 2-3/10 with functional activities   Status On-going     PT LONG TERM GOAL #4   Title R shoulder strength 4+/5 or better to improve function (Not to be addressed until okayed by protocol/MD)   Status  On-going               Plan - 10/15/16 1153    Clinical Impression Statement Pt with difficulty performing threaband therex, requires hand over hand assist for technique and to not use compensation. continues with decreased strength and ROM Rt shoulder especially flexion and ER   PT Frequency 3x / week   PT Duration 8 weeks   PT Treatment/Interventions ADLs/Self Care Home Management;Electrical Stimulation;Patient/family education;Neuromuscular re-education;Therapeutic exercise;Manual techniques;Passive range of motion;Vasopneumatic Device   PT Next Visit Plan cont with POC per protocol given by MD (see binder) MD. Begin attempting lawnchair progression for flexion. awaiting updated order      Patient will benefit from skilled therapeutic intervention in order to improve the following deficits and impairments:  Decreased range of motion, Impaired UE functional use, Pain, Decreased strength  Visit Diagnosis: Right shoulder pain, unspecified chronicity  Stiffness of right shoulder, not elsewhere classified  Acute pain of right shoulder     Problem List Patient Active Problem List   Diagnosis Date Noted  . 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 Abdominal pain, acute, bilateral lower quadrant 07/01/2014  . Transaminitis 07/01/2014  . COPD (chronic obstructive pulmonary disease) ([place])   . Asthma   . Interstitial cystitis   . Unspecified vitamin D deficiency 05/29/2013  . Lumbar radiculopathy 05/29/2013  . Back strain 05/29/2013  . Unspecified asthma(493.90) 02/27/2013  . Depression with anxiety 02/22/2011  . Uncontrolled type 2 diabetes mellitus with insulin therapy ([place]) 02/22/2011  . Hyperlipemia 02/22/2011  . HTN (hypertension) 02/22/2011    Isabelle Course, PT, DPT 10/15/2016, 11:56 AM  [facility] [address] [place], [place], [address] Phone: [phone]   Fax:  [phone]  Name: Diana Pratt MRN: [id number] Date of Birth: 03/09/1946

## 2016-10-18 ENCOUNTER — Ambulatory Visit: Payer: Medicare Other | Admitting: Physical Therapy

## 2016-10-19 ENCOUNTER — Ambulatory Visit (INDEPENDENT_AMBULATORY_CARE_PROVIDER_SITE_OTHER): Payer: Medicare Other | Admitting: Family Medicine

## 2016-10-19 ENCOUNTER — Encounter: Payer: Self-pay | Admitting: Family Medicine

## 2016-10-19 VITALS — BP 137/63 | HR 88 | Temp 96.8°F | Ht 64.0 in | Wt 209.0 lb

## 2016-10-19 DIAGNOSIS — E785 Hyperlipidemia, unspecified: Secondary | ICD-10-CM | POA: Diagnosis not present

## 2016-10-19 DIAGNOSIS — E1165 Type 2 diabetes mellitus with hyperglycemia: Secondary | ICD-10-CM

## 2016-10-19 DIAGNOSIS — Z794 Long term (current) use of insulin: Secondary | ICD-10-CM

## 2016-10-19 DIAGNOSIS — N898 Other specified noninflammatory disorders of vagina: Secondary | ICD-10-CM | POA: Diagnosis not present

## 2016-10-19 DIAGNOSIS — IMO0002 Reserved for concepts with insufficient information to code with codable children: Secondary | ICD-10-CM

## 2016-10-19 DIAGNOSIS — I10 Essential (primary) hypertension: Secondary | ICD-10-CM | POA: Diagnosis not present

## 2016-10-19 LAB — BAYER DCA HB A1C WAIVED: HB A1C: 10.7 % — AB (ref <7.0)

## 2016-10-19 MED ORDER — ALBUTEROL SULFATE HFA 108 (90 BASE) MCG/ACT IN AERS: 2.0000 | INHALATION_SPRAY | Freq: Four times a day (QID) | RESPIRATORY_TRACT | 1 refills | Status: DC | PRN

## 2016-10-19 MED ORDER — SITAGLIPTIN PHOSPHATE 100 MG PO TABS: 100.0000 mg | ORAL_TABLET | Freq: Every day | ORAL | 3 refills | Status: DC

## 2016-10-19 MED ORDER — LORAZEPAM 1 MG PO TABS: ORAL_TABLET | ORAL | 2 refills | Status: DC

## 2016-10-19 MED ORDER — FLUCONAZOLE 150 MG PO TABS: ORAL_TABLET | ORAL | 0 refills | Status: DC

## 2016-10-19 NOTE — Patient Instructions (Signed)
Great to see you!  Take diflucan for the itching and discharge  Increase lantus to 54 units, increase humalog to 8 units each meal and record the blood sugar reading 2 hours after you eat.

## 2016-10-19 NOTE — Progress Notes (Signed)
   HPI  Patient presents today for follow-up of chronic medical conditions as well as vaginal discharge.  Patient explains she's had 3-4 days of thick white itchy vaginal discharge with no foul smell. She has not had any recent antibiotics. She states this is very consistent with previous yeast infections. No fevers, chills, sweats, abdominal pain, or back pain that is new.  Type 2 diabetes Sitting blood sugars range 140-180, postprandials are average around 200 No hypoglycemia Good medication compliance with Lantus, Humalog, and Januvia. Patient's watching her diet moderately.  Hypertension Good medication compliance, started metoprolol without any side effects. Has occasional palpitations with no associated shortness of breath, chest pain, or weakness.  Hyperlipidemia States that every statin she has tried causes muscle and bone pain. She states that she has not been taking Crestor. 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 Increased Lantus to 54 units daily, increase Humalog to 8 units every meal Record postprandials  # Hyperlipidemia Patient does not remember if she's tried Livalo, given samples today She has not tolerated Crestor, I canceled her repeat lipid test today as I do not expect given the much improved. Samples given for 2 weeks Call if needed a prescription, return to clinic in 3 months  # Hypertension Patient previously on Exforge, we added metoprolol last visit It appears well controlled currently  with no side effects No changes  # Vaginal discharge Likely yeast vaginosis Treat with Diflucan 2 Recommended very low threshold for follow-up if symptoms do not improve as we did not do a swab today. She initially had a urinalysis ordered for possible UTI, however symptoms were not consistent with UTI and patient felt she likely had yeast infection not UTI.     Orders Placed This Encounter  Procedures  . Urinalysis, Complete  . Bayer DCA Hb A1c Waived  . Lipid panel  . CMP14+EGFR    Meds ordered this encounter  Medications  . albuterol (PROAIR HFA) 108 (90 Base) MCG/ACT inhaler    Sig: Inhale 2 puffs into the lungs every 6 (six) hours as needed for shortness of breath.    Dispense:  18 g    Refill:  1  . sitaGLIPtin (JANUVIA) 100 MG tablet    Sig: Take 1 tablet (100 mg total) by mouth daily.    Dispense:  90 tablet    Refill:  3  . LORazepam (ATIVAN) 1 MG tablet    Sig: TAKE 1 TABLET TWICE DAILY AS NEEDED FOR ANXIETY    Dispense:  60 tablet    Refill:  2  . fluconazole (DIFLUCAN) 150 MG tablet    Sig: Take one pill and repeat in 3 days    Dispense:  2 tablet    Refill:  0    Laroy Apple, MD Tristan Schroeder [facility] 10/19/2016, 8:32 AM

## 2016-10-22 ENCOUNTER — Ambulatory Visit: Payer: Medicare Other | Admitting: Physical Therapy

## 2016-10-25 ENCOUNTER — Encounter: Payer: Medicare Other | Admitting: *Deleted

## 2016-11-01 ENCOUNTER — Ambulatory Visit: Payer: Medicare Other | Admitting: Physical Therapy

## 2016-11-06 ENCOUNTER — Ambulatory Visit: Payer: Medicare Other | Attending: Orthopedic Surgery | Admitting: Physical Therapy

## 2016-11-06 DIAGNOSIS — M25611 Stiffness of right shoulder, not elsewhere classified: Secondary | ICD-10-CM

## 2016-11-06 DIAGNOSIS — M25511 Pain in right shoulder: Secondary | ICD-10-CM | POA: Insufficient documentation

## 2016-11-06 DIAGNOSIS — G8929 Other chronic pain: Secondary | ICD-10-CM | POA: Diagnosis not present

## 2016-11-06 NOTE — Therapy (Signed)
[facility] [place], [place], [address] Phone: [phone]   Fax:  [phone]  Physical Therapy Treatment  Patient Details  Name: Diana Pratt MRN: [id number] Date of Birth: 11-16-1945 Referring Provider: Justice Britain  Encounter Date: 11/06/2016      PT End of Session - 11/06/16 1603    PT Start Time 0317   PT Stop Time 0410   PT Time Calculation (min) 53 min   Activity Tolerance Patient tolerated treatment well   Behavior During Therapy [facility] for tasks assessed/performed      Past Medical History:  Diagnosis Date  . Anxiety   . Asthma   . Back pain   . 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 COPD (chronic obstructive pulmonary disease) ([place])    dr. Kenn File  . Depression   . Diabetes mellitus   . Diabetic neuropathy ([place])   . GERD (gastroesophageal reflux disease)    occ heartburn  . Humerus fracture    right  . Hyperlipemia   . Hypertension   . Interstitial cystitis   . Osteoporosis   . Pneumonia    15 yrs ago  . PONV (postoperative nausea and vomiting)   . Shortness of breath dyspnea     Past Surgical History:  Procedure Laterality Date  . ABDOMINAL HYSTERECTOMY  03/1984   partial  . COLONOSCOPY N/A 07/19/2014   Procedure: COLONOSCOPY;  Surgeon: Danie Binder, MD;  [facility];  Service: Endoscopy;  Laterality: N/A;  9:30  . REVERSE SHOULDER ARTHROPLASTY Right 06/14/2016   Procedure: REVERSE SHOULDER ARTHROPLASTY;  Surgeon: Justice Britain, MD;  Location: [place];  Service: Orthopedics;  Laterality: Right;    There were no vitals filed for this visit.      Subjective Assessment - 11/06/16 1547    Subjective I haven't been able to come to therapy becaue I had the flu.   Pain Score 3    Pain Location Shoulder   Pain Orientation Right   Pain Descriptors / Indicators Sore   Pain Onset More than a month ago            [facility] PT Assessment -  11/06/16 0001      PROM   Right Shoulder Flexion 120 Degrees   Right Shoulder External Rotation 38 Degrees                     [facility] Adult PT Treatment/Exercise - 11/06/16 0001      Shoulder Exercises: Pulleys   Flexion --  5 minutes.   Other Pulley Exercises UE Ranger on wall x 5 minutes.   Other Pulley Exercises Wall ladder x 5 minutes.     Acupuncturist Location RT SHLD.   Electrical Stimulation Action Constant pre-mod. x 20 minutes.   Electrical Stimulation Goals Pain     Manual Therapy   Passive ROM PROM into right shoulder flexion and ER x 8 minutes                  PT Short Term Goals - 10/15/16 1154      PT SHORT TERM GOAL #1   Title Improve R shoulder ER to 20 degrees   Status Achieved     PT SHORT TERM GOAL #2   Title decrease R shoulder pain to 5/10.   Status Achieved  PT Long Term Goals - 10/15/16 1155      PT LONG TERM GOAL #1   Title I with HEP   Status Achieved     PT LONG TERM GOAL #2   Title Improved R shoulder ROM to [facility]   Status On-going     PT LONG TERM GOAL #3   Title Decreased pain in R shoulder to 2-3/10 with functional activities   Status On-going     PT LONG TERM GOAL #4   Title R shoulder strength 4+/5 or better to improve function (Not to be addressed until okayed by protocol/MD)   Status On-going               Plan - 2016/11/24 1552    Clinical Impression Statement The patient has been sick and has not been able to come into physical therapy for 3 weeks.      Patient will benefit from skilled therapeutic intervention in order to improve the following deficits and impairments:  Decreased range of motion, Impaired UE functional use, Pain, Decreased strength  Visit Diagnosis: Stiffness of right shoulder, not elsewhere classified  Chronic right shoulder pain - Plan: PT plan of care cert/re-cert       G-Codes - 11-24-16 1549    Functional Assessment  Tool Used 30 visit.   Functional Limitation Other PT primary   Other PT Primary Current Status [id number]) At least 40 percent but less than 60 percent impaired, limited or restricted   Other PT Primary Goal Status [id number]) At least 20 percent but less than 40 percent impaired, limited or restricted      Problem List Patient Active Problem List   Diagnosis Date Noted  . 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 Abdominal pain, acute, bilateral lower quadrant 07/01/2014  . Transaminitis 07/01/2014  . COPD (chronic obstructive pulmonary disease) ([place])   . Asthma   . Interstitial cystitis   . Unspecified vitamin D deficiency 05/29/2013  . Lumbar radiculopathy 05/29/2013  . Back strain 05/29/2013  . Unspecified asthma(493.90) 02/27/2013  . Depression with anxiety 02/22/2011  . Uncontrolled type 2 diabetes mellitus with insulin therapy ([place]) 02/22/2011  . Hyperlipemia 02/22/2011  . HTN (hypertension) 02/22/2011    Starlette Thurow, [place] MPT 2016/11/24, 4:14 PM  [facility] [address] [place], [place], [address] Phone: [phone]   Fax:  [phone]  Name: Diana Pratt MRN: [id number] Date of Birth: 26-Dec-1945

## 2016-11-08 ENCOUNTER — Ambulatory Visit: Payer: Medicare Other | Admitting: Physical Therapy

## 2016-11-12 ENCOUNTER — Encounter: Admitting: Physical Therapy

## 2016-11-15 ENCOUNTER — Ambulatory Visit: Payer: Medicare Other | Admitting: Physical Therapy

## 2016-11-15 ENCOUNTER — Encounter: Payer: Self-pay | Admitting: Physical Therapy

## 2016-11-15 DIAGNOSIS — M25611 Stiffness of right shoulder, not elsewhere classified: Secondary | ICD-10-CM | POA: Diagnosis not present

## 2016-11-15 DIAGNOSIS — M25511 Pain in right shoulder: Secondary | ICD-10-CM | POA: Diagnosis not present

## 2016-11-15 DIAGNOSIS — G8929 Other chronic pain: Secondary | ICD-10-CM | POA: Diagnosis not present

## 2016-11-15 NOTE — Therapy (Signed)
[facility] [place], [place], [address] Phone: [phone]   Fax:  [phone]  Physical Therapy Treatment  Patient Details  Name: Diana Pratt MRN: [id number] Date of Birth: 11/15/1945 Referring Provider: Justice Britain  Encounter Date: 11/15/2016      PT End of Session - 11/15/16 1417    Visit Number 31   Number of Visits 36   Date for PT Re-Evaluation 01/05/17   PT Start Time [id number]   PT Stop Time 1443   PT Time Calculation (min) 44 min   Activity Tolerance Patient tolerated treatment well   Behavior During Therapy [facility] for tasks assessed/performed      Past Medical History:  Diagnosis Date  . Anxiety   . Asthma   . Back pain   . 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 COPD (chronic obstructive pulmonary disease) ([place])    dr. Kenn File  . Depression   . Diabetes mellitus   . Diabetic neuropathy ([place])   . GERD (gastroesophageal reflux disease)    occ heartburn  . Humerus fracture    right  . Hyperlipemia   . Hypertension   . Interstitial cystitis   . Osteoporosis   . Pneumonia    15 yrs ago  . PONV (postoperative nausea and vomiting)   . Shortness of breath dyspnea     Past Surgical History:  Procedure Laterality Date  . ABDOMINAL HYSTERECTOMY  03/1984   partial  . COLONOSCOPY N/A 07/19/2014   Procedure: COLONOSCOPY;  Surgeon: Danie Binder, MD;  [facility];  Service: Endoscopy;  Laterality: N/A;  9:30  . REVERSE SHOULDER ARTHROPLASTY Right 06/14/2016   Procedure: REVERSE SHOULDER ARTHROPLASTY;  Surgeon: Justice Britain, MD;  Location: [place];  Service: Orthopedics;  Laterality: Right;    There were no vitals filed for this visit.      Subjective Assessment - 11/15/16 1403    Subjective Patient shoulder stiff and sore today   Pertinent History DM, OP, COPD   Limitations Lifting   Patient Stated Goals get over this pain and get back to using this  arm   Currently in Pain? Yes   Pain Score 3    Pain Location Shoulder   Pain Orientation Right   Pain Descriptors / Indicators Sore   Pain Type Surgical pain   Pain Onset More than a month ago   Pain Frequency Constant   Aggravating Factors  certain movements   Pain Relieving Factors at rest            [facility] PT Assessment - 11/15/16 0001      ROM / Strength   AROM / PROM / Strength AROM;PROM     AROM   Right/Left Shoulder Right   Right Shoulder Flexion 94 Degrees   Right Shoulder External Rotation 38 Degrees     PROM   Right Shoulder Flexion 120 Degrees   Right Shoulder External Rotation 44 Degrees                     [facility] Adult PT Treatment/Exercise - 11/15/16 0001      Shoulder Exercises: Supine   Protraction AROM;Strengthening;Right;20 reps   Flexion AROM;Right;20 reps     Shoulder Exercises: Pulleys   Flexion Other (comment)  70min   Other Pulley Exercises UE ranger for elevation and circles   Other Pulley Exercises --     Moist Heat Therapy  Number Minutes Moist Heat 15 Minutes   Moist Heat Location Shoulder     Electrical Stimulation   Electrical Stimulation Location RT SHLD.   Electrical Stimulation Action premod   Electrical Stimulation Parameters 80-150hz  x25min     Manual Therapy   Manual Therapy Passive ROM   Passive ROM PROM into right shoulder flexion and ER                   PT Short Term Goals - 10/15/16 1154      PT SHORT TERM GOAL #1   Title Improve R shoulder ER to 20 degrees   Status Achieved     PT SHORT TERM GOAL #2   Title decrease R shoulder pain to 5/10.   Status Achieved           PT Long Term Goals - 11/15/16 1421      PT LONG TERM GOAL #1   Title I with HEP   Time 8   Period Weeks   Status Achieved     PT LONG TERM GOAL #2   Title Improved R shoulder ROM to [facility]   Time 8   Period Weeks   Status On-going  AROM 38 degrees ER, 94 degrees flexion 11/15/16     PT LONG TERM GOAL #3    Title Decreased pain in R shoulder to 2-3/10 with functional activities   Time 8   Period Weeks   Status On-going     PT LONG TERM GOAL #4   Title R shoulder strength 4+/5 or better to improve function (Not to be addressed until okayed by protocol/MD)   Time 8   Period Weeks   Status On-going               Plan - 11/15/16 1426    Clinical Impression Statement Patient progressing slowly with ROM today due to ongoing reported pain and stiffness in right shoulder. Patient has limited AROM in right shoulder due to weakness. Patient has increased PROM supine and increased active range in supine. Patient has reported difficulty with overhead reaching and unable to perform her normal ADL's. Patient remaing goals ongoing to to ROM and strength deficts.   Rehab Potential Excellent   Clinical Impairments Affecting Rehab Potential 06/14/16 surgury/ current 22 week 11/15/16   PT Frequency 3x / week   PT Duration 8 weeks   PT Treatment/Interventions ADLs/Self Care Home Management;Electrical Stimulation;Patient/family education;Neuromuscular re-education;Therapeutic exercise;Manual techniques;Passive range of motion;Vasopneumatic Device   PT Next Visit Plan cont with POC per protocol given by MD (see binder) MD.    Consulted and Agree with Plan of Care Patient      Patient will benefit from skilled therapeutic intervention in order to improve the following deficits and impairments:  Decreased range of motion, Impaired UE functional use, Pain, Decreased strength  Visit Diagnosis: Chronic right shoulder pain  Stiffness of right shoulder, not elsewhere classified  Right shoulder pain, unspecified chronicity     Problem List Patient Active Problem List   Diagnosis Date Noted  . 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 Abdominal pain, acute, bilateral lower quadrant 07/01/2014  . Transaminitis 07/01/2014  . COPD (chronic obstructive pulmonary disease) ([place])   . Asthma   .  Interstitial cystitis   . Unspecified vitamin D deficiency 05/29/2013  . Lumbar radiculopathy 05/29/2013  . Back strain 05/29/2013  . Unspecified asthma(493.90) 02/27/2013  . Depression with anxiety 02/22/2011  . Uncontrolled type 2 diabetes mellitus with insulin therapy (  [place]) 02/22/2011  . Hyperlipemia 02/22/2011  . HTN (hypertension) 02/22/2011    Bunnie Lederman P, PTA 11/15/2016, 2:43 PM  [facility] [place], [place], [address] Phone: [phone]   Fax:  [phone]  Name: Diana Pratt MRN: [id number] Date of Birth: [birth year]

## 2016-11-19 ENCOUNTER — Ambulatory Visit: Payer: Medicare Other | Admitting: Physical Therapy

## 2016-11-19 ENCOUNTER — Encounter: Payer: Self-pay | Admitting: Physical Therapy

## 2016-11-19 DIAGNOSIS — M25511 Pain in right shoulder: Principal | ICD-10-CM

## 2016-11-19 DIAGNOSIS — M25611 Stiffness of right shoulder, not elsewhere classified: Secondary | ICD-10-CM

## 2016-11-19 DIAGNOSIS — G8929 Other chronic pain: Secondary | ICD-10-CM

## 2016-11-19 NOTE — Therapy (Signed)
[facility] [place], [place], [address] Phone: [phone]   Fax:  [phone]  Physical Therapy Treatment  Patient Details  Name: Diana Pratt MRN: [id number] Date of Birth: 1946/06/14 Referring Provider: Justice Britain  Encounter Date: 11/19/2016      PT End of Session - 11/19/16 1506    Visit Number 32   Number of Visits 36   Date for PT Re-Evaluation 01/05/17   PT Start Time 1502  17 min late   PT Stop Time 1530   PT Time Calculation (min) 28 min   Activity Tolerance Patient tolerated treatment well   Behavior During Therapy [facility] for tasks assessed/performed      Past Medical History:  Diagnosis Date  . Anxiety   . Asthma   . Back pain   . 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 COPD (chronic obstructive pulmonary disease) ([place])    dr. Kenn File  . Depression   . Diabetes mellitus   . Diabetic neuropathy ([place])   . GERD (gastroesophageal reflux disease)    occ heartburn  . Humerus fracture    right  . Hyperlipemia   . Hypertension   . Interstitial cystitis   . Osteoporosis   . Pneumonia    15 yrs ago  . PONV (postoperative nausea and vomiting)   . Shortness of breath dyspnea     Past Surgical History:  Procedure Laterality Date  . ABDOMINAL HYSTERECTOMY  03/1984   partial  . COLONOSCOPY N/A 07/19/2014   Procedure: COLONOSCOPY;  Surgeon: Danie Binder, MD;  [facility];  Service: Endoscopy;  Laterality: N/A;  9:30  . REVERSE SHOULDER ARTHROPLASTY Right 06/14/2016   Procedure: REVERSE SHOULDER ARTHROPLASTY;  Surgeon: Justice Britain, MD;  Location: [place];  Service: Orthopedics;  Laterality: Right;    There were no vitals filed for this visit.      Subjective Assessment - 11/19/16 1503    Subjective Patient arrived late and not feeling well today and sick with sore throat   Pertinent History DM, OP, COPD   Limitations Lifting   Patient Stated  Goals get over this pain and get back to using this arm   Currently in Pain? Yes   Pain Score 3    Pain Location Shoulder   Pain Orientation Right   Pain Descriptors / Indicators Sore   Pain Type Surgical pain   Pain Onset More than a month ago   Pain Frequency Constant   Aggravating Factors  certain movements   Pain Relieving Factors at rest                         [facility] Adult PT Treatment/Exercise - 11/19/16 0001      Shoulder Exercises: Pulleys   Flexion Other (comment)  81min   Other Pulley Exercises UE ranger for elevation and circles 3x10   Other Pulley Exercises Wall ladder x 4 minutes.                  PT Short Term Goals - 10/15/16 1154      PT SHORT TERM GOAL #1   Title Improve R shoulder ER to 20 degrees   Status Achieved     PT SHORT TERM GOAL #2   Title decrease R shoulder pain to 5/10.   Status Achieved           PT Long  Term Goals - 11/15/16 1421      PT LONG TERM GOAL #1   Title I with HEP   Time 8   Period Weeks   Status Achieved     PT LONG TERM GOAL #2   Title Improved R shoulder ROM to [facility]   Time 8   Period Weeks   Status On-going  AROM 38 degrees ER, 94 degrees flexion 11/15/16     PT LONG TERM GOAL #3   Title Decreased pain in R shoulder to 2-3/10 with functional activities   Time 8   Period Weeks   Status On-going     PT LONG TERM GOAL #4   Title R shoulder strength 4+/5 or better to improve function (Not to be addressed until okayed by protocol/MD)   Time 8   Period Weeks   Status On-going               Plan - 11/19/16 1507    Clinical Impression Statement Patient progressing slowly today due to not feeling well and arriving late. Patient reported not doing HEP as much as she should. Educated patient on protocol and HEP to improve functional independence. Patient limited with ROM in right shoulder and has some weakness with overhead reaching. Patient continues to report difficulty with ADL's.  Goals ongoing due to ROM and strength deficts.   Rehab Potential Excellent   Clinical Impairments Affecting Rehab Potential 06/14/16 surgury/ current 22 week 11/15/16   PT Frequency 3x / week   PT Duration 8 weeks   PT Treatment/Interventions ADLs/Self Care Home Management;Electrical Stimulation;Patient/family education;Neuromuscular re-education;Therapeutic exercise;Manual techniques;Passive range of motion;Vasopneumatic Device   PT Next Visit Plan cont with POC per protocol given by MD (see binder) MD.    Consulted and Agree with Plan of Care Patient      Patient will benefit from skilled therapeutic intervention in order to improve the following deficits and impairments:  Decreased range of motion, Impaired UE functional use, Pain, Decreased strength  Visit Diagnosis: Chronic right shoulder pain  Stiffness of right shoulder, not elsewhere classified     Problem List Patient Active Problem List   Diagnosis Date Noted  . 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 Abdominal pain, acute, bilateral lower quadrant 07/01/2014  . Transaminitis 07/01/2014  . COPD (chronic obstructive pulmonary disease) ([place])   . Asthma   . Interstitial cystitis   . Unspecified vitamin D deficiency 05/29/2013  . Lumbar radiculopathy 05/29/2013  . Back strain 05/29/2013  . Unspecified asthma(493.90) 02/27/2013  . Depression with anxiety 02/22/2011  . Uncontrolled type 2 diabetes mellitus with insulin therapy ([place]) 02/22/2011  . Hyperlipemia 02/22/2011  . HTN (hypertension) 02/22/2011    Amariana Mirando P, PTA 11/19/2016, 3:31 PM  [facility] [address] [place], [place], [address] Phone: [phone]   Fax:  [phone]  Name: Diana Pratt MRN: [id number] Date of Birth: Sep 25, 1946

## 2016-11-22 ENCOUNTER — Encounter: Admitting: Physical Therapy

## 2016-11-23 DIAGNOSIS — Z794 Long term (current) use of insulin: Secondary | ICD-10-CM | POA: Diagnosis not present

## 2016-11-23 DIAGNOSIS — H5203 Hypermetropia, bilateral: Secondary | ICD-10-CM | POA: Diagnosis not present

## 2016-11-23 DIAGNOSIS — H52223 Regular astigmatism, bilateral: Secondary | ICD-10-CM | POA: Diagnosis not present

## 2016-11-23 DIAGNOSIS — H524 Presbyopia: Secondary | ICD-10-CM | POA: Diagnosis not present

## 2016-11-23 LAB — HM DIABETES EYE EXAM

## 2016-11-26 ENCOUNTER — Encounter: Payer: Self-pay | Admitting: Physical Therapy

## 2016-11-26 ENCOUNTER — Ambulatory Visit: Payer: Medicare Other | Admitting: Physical Therapy

## 2016-11-26 DIAGNOSIS — M25611 Stiffness of right shoulder, not elsewhere classified: Secondary | ICD-10-CM | POA: Diagnosis not present

## 2016-11-26 DIAGNOSIS — M25511 Pain in right shoulder: Principal | ICD-10-CM

## 2016-11-26 DIAGNOSIS — G8929 Other chronic pain: Secondary | ICD-10-CM

## 2016-11-26 NOTE — Therapy (Signed)
[facility] [place], [place], [address] Phone: [phone]   Fax:  [phone]  Physical Therapy Treatment  Patient Details  Name: Diana Pratt MRN: [id number] Date of Birth: 1946/05/06 Referring Provider: Justice Britain  Encounter Date: 11/26/2016      PT End of Session - 11/26/16 1211    Visit Number 33   Number of Visits 36   Date for PT Re-Evaluation 01/05/17   PT Start Time 1119   PT Stop Time 1209   PT Time Calculation (min) 50 min   Activity Tolerance Patient tolerated treatment well   Behavior During Therapy [facility] for tasks assessed/performed      Past Medical History:  Diagnosis Date  . Anxiety   . Asthma   . Back pain   . 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 COPD (chronic obstructive pulmonary disease) ([place])    dr. Kenn File  . Depression   . Diabetes mellitus   . Diabetic neuropathy ([place])   . GERD (gastroesophageal reflux disease)    occ heartburn  . Humerus fracture    right  . Hyperlipemia   . Hypertension   . Interstitial cystitis   . Osteoporosis   . Pneumonia    15 yrs ago  . PONV (postoperative nausea and vomiting)   . Shortness of breath dyspnea     Past Surgical History:  Procedure Laterality Date  . ABDOMINAL HYSTERECTOMY  03/1984   partial  . COLONOSCOPY N/A 07/19/2014   Procedure: COLONOSCOPY;  Surgeon: Danie Binder, MD;  [facility];  Service: Endoscopy;  Laterality: N/A;  9:30  . REVERSE SHOULDER ARTHROPLASTY Right 06/14/2016   Procedure: REVERSE SHOULDER ARTHROPLASTY;  Surgeon: Justice Britain, MD;  Location: [place];  Service: Orthopedics;  Laterality: Right;    There were no vitals filed for this visit.      Subjective Assessment - 11/26/16 1120    Subjective Reports that she is still having soreness in R anterior shoulder. Has different HEPs at home at this time.   Pertinent History DM, OP, COPD   Limitations Lifting    Patient Stated Goals get over this pain and get back to using this arm   Currently in Pain? Yes   Pain Score 3    Pain Location Shoulder   Pain Orientation Right   Pain Descriptors / Indicators Sore   Pain Type Surgical pain   Pain Onset More than a month ago            [facility] PT Assessment - 11/26/16 0001      Assessment   Medical Diagnosis s/p R Reverse TSA   Onset Date/Surgical Date 06/14/16   Next MD Visit 12/2016   Prior Therapy no     Precautions   Precautions Shoulder   Precaution Comments SEE PROTOCOL     ROM / Strength   AROM / PROM / Strength AROM;PROM     AROM   Overall AROM  Deficits   Overall AROM Comments in supine   AROM Assessment Site Shoulder   Right/Left Shoulder Right   Right Shoulder Flexion 120 Degrees   Right Shoulder External Rotation 50 Degrees     PROM   Overall PROM  Deficits   Overall PROM Comments in supine   PROM Assessment Site Shoulder   Right/Left Shoulder Right   Right Shoulder Flexion 120 Degrees   Right Shoulder External Rotation 56 Degrees  [facility] Adult PT Treatment/Exercise - 11/26/16 0001      Shoulder Exercises: Standing   External Rotation AAROM;Strengthening;Right;20 reps;Theraband   Theraband Level (Shoulder External Rotation) Level 2 (Red)   Internal Rotation Strengthening;Right;20 reps;Theraband   Theraband Level (Shoulder Internal Rotation) Level 2 (Red)   Extension Strengthening;Right;20 reps;Theraband   Theraband Level (Shoulder Extension) Level 2 (Red)   Row Strengthening;Right;20 reps;Theraband   Theraband Level (Shoulder Row) Level 2 (Red)     Shoulder Exercises: Pulleys   Flexion Other (comment)  x5 min   Other Pulley Exercises UE ranger for elevation and circles 2x10   Other Pulley Exercises Wall ladder x10 reps     Modalities   Modalities Electrical Stimulation;Moist Heat     Moist Heat Therapy   Number Minutes Moist Heat 15 Minutes   Moist Heat Location Shoulder      Electrical Stimulation   Electrical Stimulation Location R shoulder   Electrical Stimulation Action Pre-Mod   Electrical Stimulation Parameters 80-150 hz x15 min   Electrical Stimulation Goals Pain     Manual Therapy   Manual Therapy Passive ROM   Passive ROM PROM of R shoulder into flex/ER with holds at end range to improve ROM                  PT Short Term Goals - 10/15/16 1154      PT SHORT TERM GOAL #1   Title Improve R shoulder ER to 20 degrees   Status Achieved     PT SHORT TERM GOAL #2   Title decrease R shoulder pain to 5/10.   Status Achieved           PT Long Term Goals - 11/15/16 1421      PT LONG TERM GOAL #1   Title I with HEP   Time 8   Period Weeks   Status Achieved     PT LONG TERM GOAL #2   Title Improved R shoulder ROM to [facility]   Time 8   Period Weeks   Status On-going  AROM 38 degrees ER, 94 degrees flexion 11/15/16     PT LONG TERM GOAL #3   Title Decreased pain in R shoulder to 2-3/10 with functional activities   Time 8   Period Weeks   Status On-going     PT LONG TERM GOAL #4   Title R shoulder strength 4+/5 or better to improve function (Not to be addressed until okayed by protocol/MD)   Time 8   Period Weeks   Status On-going               Plan - 11/26/16 1205    Clinical Impression Statement Patient continues to present with R shoulder soreness especially along anterior incision. Patient guided through ROM exercises with flexion observed as improved from previous treatments. Patient continues to demonstrate R shoulder weakness as she was increased to red theraband secondary to patient reports of ease with yellow theraband. Patient continues to be especially weak in R shoulder ER with AAROM required to complete motion. PROM of R shoulder measured with improvements in both directions assessed. Patient experienced R shoulder popping with PROM into ER but no problem into flexion. Normal modalities response noted following  removal of the modalities. Goals remain on-going secondary to deficits with ROM and strength.   Rehab Potential Excellent   Clinical Impairments Affecting Rehab Potential 06/14/16 surgury/ current 22 week 11/15/16   PT Frequency 3x / week   PT Duration 8  weeks   PT Treatment/Interventions ADLs/Self Care Home Management;Electrical Stimulation;Patient/family education;Neuromuscular re-education;Therapeutic exercise;Manual techniques;Passive range of motion;Vasopneumatic Device   PT Next Visit Plan cont with POC per protocol given by MD (see binder) MD.    PT Home Exercise Plan elbow, wrist ROM and hand squeezes   Consulted and Agree with Plan of Care Patient      Patient will benefit from skilled therapeutic intervention in order to improve the following deficits and impairments:  Decreased range of motion, Impaired UE functional use, Pain, Decreased strength  Visit Diagnosis: Chronic right shoulder pain  Stiffness of right shoulder, not elsewhere classified     Problem List Patient Active Problem List   Diagnosis Date Noted  . 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 Abdominal pain, acute, bilateral lower quadrant 07/01/2014  . Transaminitis 07/01/2014  . COPD (chronic obstructive pulmonary disease) ([place])   . Asthma   . Interstitial cystitis   . Unspecified vitamin D deficiency 05/29/2013  . Lumbar radiculopathy 05/29/2013  . Back strain 05/29/2013  . Unspecified asthma(493.90) 02/27/2013  . Depression with anxiety 02/22/2011  . Uncontrolled type 2 diabetes mellitus with insulin therapy ([place]) 02/22/2011  . Hyperlipemia 02/22/2011  . HTN (hypertension) 02/22/2011    Wynelle Fanny, PTA 11/26/2016, 12:13 PM  [facility] [address] [place], [place], [address] Phone: [phone]   Fax:  [phone]  Name: Diana Pratt MRN: [id number] Date of Birth: 26-Jun-1946

## 2016-11-27 ENCOUNTER — Telehealth: Payer: Self-pay | Admitting: Family Medicine

## 2016-11-28 NOTE — Telephone Encounter (Signed)
Patient states that when husband went to pharmacy to pick up Januvia the cost was $137.  She states that she cannot afford this much each month.  It is likely that this is just part of deductible she must meet the the beginning of each year  But I will check into.

## 2016-11-29 ENCOUNTER — Ambulatory Visit: Payer: Medicare Other | Admitting: *Deleted

## 2016-11-29 ENCOUNTER — Other Ambulatory Visit: Payer: Self-pay | Admitting: Pharmacist

## 2016-11-29 DIAGNOSIS — M25511 Pain in right shoulder: Secondary | ICD-10-CM

## 2016-11-29 DIAGNOSIS — G8929 Other chronic pain: Secondary | ICD-10-CM

## 2016-11-29 DIAGNOSIS — M25611 Stiffness of right shoulder, not elsewhere classified: Secondary | ICD-10-CM | POA: Diagnosis not present

## 2016-11-29 NOTE — Telephone Encounter (Signed)
   Too early for refill for ativan, pt needs appt for treating vagianl dryness.   Ativan filled in Dec with 2 refills.    Laroy Apple, MD [facility] 11/29/2016, 11:53 AM

## 2016-11-29 NOTE — Therapy (Signed)
[facility] [place], [place], [address] Phone: [phone]   Fax:  [phone]  Physical Therapy Treatment  Patient Details  Name: Diana Pratt MRN: [id number] Date of Birth: Oct 13, 1946 Referring Provider: Justice Britain  Encounter Date: 11/29/2016      PT End of Session - 11/29/16 1410    Visit Number 35   Number of Visits 48   Date for PT Re-Evaluation 01/05/17   PT Start Time [id number]   PT Stop Time 1445   PT Time Calculation (min) 60 min      Past Medical History:  Diagnosis Date  . Anxiety   . Asthma   . Back pain   . 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 COPD (chronic obstructive pulmonary disease) ([place])    dr. Kenn File  . Depression   . Diabetes mellitus   . Diabetic neuropathy ([place])   . GERD (gastroesophageal reflux disease)    occ heartburn  . Humerus fracture    right  . Hyperlipemia   . Hypertension   . Interstitial cystitis   . Osteoporosis   . Pneumonia    15 yrs ago  . PONV (postoperative nausea and vomiting)   . Shortness of breath dyspnea     Past Surgical History:  Procedure Laterality Date  . ABDOMINAL HYSTERECTOMY  03/1984   partial  . COLONOSCOPY N/A 07/19/2014   Procedure: COLONOSCOPY;  Surgeon: Danie Binder, MD;  [facility];  Service: Endoscopy;  Laterality: N/A;  9:30  . REVERSE SHOULDER ARTHROPLASTY Right 06/14/2016   Procedure: REVERSE SHOULDER ARTHROPLASTY;  Surgeon: Justice Britain, MD;  Location: [facility];  Service: Orthopedics;  Laterality: Right;    There were no vitals filed for this visit.                       [facility] Adult PT Treatment/Exercise - 11/29/16 0001      Elbow Exercises   Elbow Flexion Right;20 reps;10 reps  2# 3x 10      Shoulder Exercises: Standing   Extension Strengthening;Right;20 reps;Theraband   Theraband Level (Shoulder Extension) Level 2 (Red)   Row Strengthening;Right;20  reps;Theraband   Theraband Level (Shoulder Row) Level 2 (Red)     Shoulder Exercises: Pulleys   Flexion Other (comment)  x5 min   Other Pulley Exercises UE ranger for elevation and circles 2x10     Modalities   Modalities Electrical Stimulation;Moist Heat     Moist Heat Therapy   Number Minutes Moist Heat 15 Minutes   Moist Heat Location Shoulder     Electrical Stimulation   Electrical Stimulation Location R shoulder Premod  80-150hz  x 15 mins   Electrical Stimulation Goals Pain     Manual Therapy   Manual Therapy Passive ROM   Passive ROM PROM AAROM of R shoulder into flex/ER with holds at end range to improve ROM. Seated AAROM for flexion                  PT Short Term Goals - 10/15/16 1154      PT SHORT TERM GOAL #1   Title Improve R shoulder ER to 20 degrees   Status Achieved     PT SHORT TERM GOAL #2   Title decrease R shoulder pain to 5/10.   Status Achieved           PT Long Term Goals - 11/15/16  [place] GOAL #1   Title I with HEP   Time 8   Period Weeks   Status Achieved     PT LONG TERM GOAL #2   Title Improved R shoulder ROM to [facility]   Time 8   Period Weeks   Status On-going  AROM 38 degrees ER, 94 degrees flexion 11/15/16     PT LONG TERM GOAL #3   Title Decreased pain in R shoulder to 2-3/10 with functional activities   Time 8   Period Weeks   Status On-going     PT LONG TERM GOAL #4   Title R shoulder strength 4+/5 or better to improve function (Not to be addressed until okayed by protocol/MD)   Time 8   Period Weeks   Status On-going               Plan - 11/29/16 1733    Clinical Impression Statement Pt  continues to present with RT shldr anterior and posterior  soreness during exs. She still has some strength deficits in RT UE and elevation is still the most challenging. We continue to use AAROM as needed to facilitate Elevation and use resistance as tolerated .    Rehab Potential Excellent   Clinical  Impairments Affecting Rehab Potential 06/14/16 surgury/ current 22 week 11/15/16   PT Frequency 3x / week   PT Duration 8 weeks   PT Treatment/Interventions ADLs/Self Care Home Management;Electrical Stimulation;Patient/family education;Neuromuscular re-education;Therapeutic exercise;Manual techniques;Passive range of motion;Vasopneumatic Device   PT Next Visit Plan cont with POC per protocol given by MD (see binder) MD.    PT Home Exercise Plan elbow, wrist ROM and hand squeezes   Consulted and Agree with Plan of Care Patient      Patient will benefit from skilled therapeutic intervention in order to improve the following deficits and impairments:  Decreased range of motion, Impaired UE functional use, Pain, Decreased strength  Visit Diagnosis: Chronic right shoulder pain  Stiffness of right shoulder, not elsewhere classified  Right shoulder pain, unspecified chronicity     Problem List Patient Active Problem List   Diagnosis Date Noted  . 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 Abdominal pain, acute, bilateral lower quadrant 07/01/2014  . Transaminitis 07/01/2014  . COPD (chronic obstructive pulmonary disease) ([place])   . Asthma   . Interstitial cystitis   . Unspecified vitamin D deficiency 05/29/2013  . Lumbar radiculopathy 05/29/2013  . Back strain 05/29/2013  . Unspecified asthma(493.90) 02/27/2013  . Depression with anxiety 02/22/2011  . Uncontrolled type 2 diabetes mellitus with insulin therapy ([place]) 02/22/2011  . Hyperlipemia 02/22/2011  . HTN (hypertension) 02/22/2011    Javonta Gronau,CHRIS, PTA 11/29/2016, 5:42 PM  [facility] [address] [place], [place], [address] Phone: [phone]   Fax:  [phone]  Name: Diana Pratt MRN: [id number] Date of Birth: [birth year]

## 2016-11-29 NOTE — Telephone Encounter (Signed)
Spoke with Suanne Marker at [facility] of [phone] was for 90 day supply.  They change and filled for only #30 days - copay will be $45.  Patient is agreeable to this.

## 2016-11-30 ENCOUNTER — Other Ambulatory Visit: Payer: Self-pay | Admitting: Family Medicine

## 2016-11-30 ENCOUNTER — Telehealth: Payer: Self-pay | Admitting: Family Medicine

## 2016-11-30 NOTE — Telephone Encounter (Signed)
Patient aware of recommendation.  

## 2016-11-30 NOTE — Telephone Encounter (Signed)
This was addressed a few days ago, She will need to be seen to discuss.   Laroy Apple, MD [facility] 11/30/2016, 2:42 PM

## 2016-12-03 ENCOUNTER — Encounter: Payer: Self-pay | Admitting: Physical Therapy

## 2016-12-03 ENCOUNTER — Other Ambulatory Visit: Payer: Self-pay | Admitting: Family Medicine

## 2016-12-03 ENCOUNTER — Ambulatory Visit: Payer: Medicare Other | Admitting: Physical Therapy

## 2016-12-03 DIAGNOSIS — M25611 Stiffness of right shoulder, not elsewhere classified: Secondary | ICD-10-CM

## 2016-12-03 DIAGNOSIS — M25511 Pain in right shoulder: Principal | ICD-10-CM

## 2016-12-03 DIAGNOSIS — G8929 Other chronic pain: Secondary | ICD-10-CM | POA: Diagnosis not present

## 2016-12-03 NOTE — Telephone Encounter (Signed)
Pt has refills until Feb, she will need to be seen for more refills.  Laroy Apple, MD [facility] 12/03/2016, 5:05 PM

## 2016-12-03 NOTE — Therapy (Signed)
[facility] [place], [place], [address] Phone: [phone]   Fax:  [phone]  Physical Therapy Treatment  Patient Details  Name: Diana Pratt MRN: [id number] Date of Birth: 03-24-1946 Referring Provider: Justice Britain  Encounter Date: 12/03/2016      PT End of Session - 12/03/16 1121    Visit Number 36   Number of Visits 48   Date for PT Re-Evaluation 01/05/17   PT Start Time 1119   PT Stop Time 1211   PT Time Calculation (min) 52 min   Activity Tolerance Patient tolerated treatment well   Behavior During Therapy [facility] for tasks assessed/performed      Past Medical History:  Diagnosis Date  . Anxiety   . Asthma   . Back pain   . 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 COPD (chronic obstructive pulmonary disease) ([place])    dr. Kenn File  . Depression   . Diabetes mellitus   . Diabetic neuropathy ([place])   . GERD (gastroesophageal reflux disease)    occ heartburn  . Humerus fracture    right  . Hyperlipemia   . Hypertension   . Interstitial cystitis   . Osteoporosis   . Pneumonia    15 yrs ago  . PONV (postoperative nausea and vomiting)   . Shortness of breath dyspnea     Past Surgical History:  Procedure Laterality Date  . ABDOMINAL HYSTERECTOMY  03/1984   partial  . COLONOSCOPY N/A 07/19/2014   Procedure: COLONOSCOPY;  Surgeon: Danie Binder, MD;  [facility];  Service: Endoscopy;  Laterality: N/A;  9:30  . REVERSE SHOULDER ARTHROPLASTY Right 06/14/2016   Procedure: REVERSE SHOULDER ARTHROPLASTY;  Surgeon: Justice Britain, MD;  Location: [facility];  Service: Orthopedics;  Laterality: Right;    There were no vitals filed for this visit.      Subjective Assessment - 12/03/16 1120    Subjective Reports soreness still continued soreness in anterior R shoulder.   Pertinent History DM, OP, COPD   Limitations Lifting   Patient Stated Goals get over this pain  and get back to using this arm   Currently in Pain? Yes   Pain Score 3    Pain Location Shoulder   Pain Orientation Right;Anterior   Pain Descriptors / Indicators Sore   Pain Type Surgical pain   Pain Onset More than a month ago            [facility] PT Assessment - 12/03/16 0001      Assessment   Medical Diagnosis s/p R Reverse TSA   Onset Date/Surgical Date 06/14/16   Next MD Visit 12/2016   Prior Therapy no     Precautions   Precautions Shoulder   Precaution Comments SEE PROTOCOL                     [facility] Adult PT Treatment/Exercise - 12/03/16 0001      Elbow Exercises   Elbow Flexion Strengthening;Right;Other reps (comment);Seated;Bar weights/barbell  2# 3x10 reps with weakness noted in Bicep     Shoulder Exercises: Seated   Flexion AAROM;Right   Flexion Limitations 3x10 reps     Shoulder Exercises: Sidelying   External Rotation AROM;Right;20 reps     Shoulder Exercises: Standing   Extension Strengthening;Right;20 reps;Theraband   Theraband Level (Shoulder Extension) Level 2 (Red)   Row Strengthening;Right;20 reps;Theraband   Theraband Level (Shoulder Row) Level  2 (Red)     Shoulder Exercises: Pulleys   Flexion Other (comment)  x5 min   Other Pulley Exercises UE ranger for elevation and circles 2x10   Other Pulley Exercises Wall ladder x10 reps     Modalities   Modalities Electrical Stimulation;Moist Heat     Moist Heat Therapy   Number Minutes Moist Heat 15 Minutes   Moist Heat Location Shoulder     Electrical Stimulation   Electrical Stimulation Location R Deltoids   Electrical Stimulation Action Pre-Mod   Electrical Stimulation Parameters 80-150 hz x15 min   Electrical Stimulation Goals Pain;Tone     Manual Therapy   Manual Therapy Myofascial release   Myofascial Release MFR/STW to R Deltoids to decrease TPs and tightness throughout deltoids                  PT Short Term Goals - 10/15/16 1154      PT SHORT TERM GOAL #1    Title Improve R shoulder ER to 20 degrees   Status Achieved     PT SHORT TERM GOAL #2   Title decrease R shoulder pain to 5/10.   Status Achieved           PT Long Term Goals - 11/15/16 1421      PT LONG TERM GOAL #1   Title I with HEP   Time 8   Period Weeks   Status Achieved     PT LONG TERM GOAL #2   Title Improved R shoulder ROM to [facility]   Time 8   Period Weeks   Status On-going  AROM 38 degrees ER, 94 degrees flexion 11/15/16     PT LONG TERM GOAL #3   Title Decreased pain in R shoulder to 2-3/10 with functional activities   Time 8   Period Weeks   Status On-going     PT LONG TERM GOAL #4   Title R shoulder strength 4+/5 or better to improve function (Not to be addressed until okayed by protocol/MD)   Time 8   Period Weeks   Status On-going               Plan - 12/03/16 1200    Clinical Impression Statement Patient continues to demonstrate R shoulder weakness and deficits in ROM as well as sorenes. Patient particularly weak with anti-gravity shoulder flexion as well as seated R bicep curl although weakness of R shoulder noted throughout treatment with varying activities. Assist required by PTA for seated anti-gravity shoulder flexion past approximately 90 deg secondary to lack of full ROM and weakness. Max multimodal cueing required for SL ER today as patient attempts to compensate with R elbow extension. Tightness palpated throughout R deltoids with patient reporting greatest tightness in mid to posterior R deltoids. MFR/STW required with pressure to alleviate tightness and TPs noted in R deltoids. Normal modalities response noted following removal of the modalities.   Rehab Potential Excellent   Clinical Impairments Affecting Rehab Potential 06/14/16 surgury/ current 22 week 11/15/16   PT Frequency 3x / week   PT Duration 2 weeks   PT Treatment/Interventions ADLs/Self Care Home Management;Electrical Stimulation;Patient/family education;Neuromuscular  re-education;Therapeutic exercise;Manual techniques;Passive range of motion;Vasopneumatic Device   PT Next Visit Plan cont with POC per protocol given by MD (see binder) MD.    PT Home Exercise Plan elbow, wrist ROM and hand squeezes   Consulted and Agree with Plan of Care Patient      Patient will benefit from skilled  therapeutic intervention in order to improve the following deficits and impairments:  Decreased range of motion, Impaired UE functional use, Pain, Decreased strength  Visit Diagnosis: Chronic right shoulder pain  Stiffness of right shoulder, not elsewhere classified     Problem List Patient Active Problem List   Diagnosis Date Noted  . 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 Abdominal pain, acute, bilateral lower quadrant 07/01/2014  . Transaminitis 07/01/2014  . COPD (chronic obstructive pulmonary disease) ([place])   . Asthma   . Interstitial cystitis   . Unspecified vitamin D deficiency 05/29/2013  . Lumbar radiculopathy 05/29/2013  . Back strain 05/29/2013  . Unspecified asthma(493.90) 02/27/2013  . Depression with anxiety 02/22/2011  . Uncontrolled type 2 diabetes mellitus with insulin therapy ([place]) 02/22/2011  . Hyperlipemia 02/22/2011  . HTN (hypertension) 02/22/2011    Wynelle Fanny, PTA 12/03/2016, 12:15 PM  [facility] [address] [place], [place], [address] Phone: [phone]   Fax:  [phone]  Name: Diana Pratt MRN: [id number] Date of Birth: May 11, 1946

## 2016-12-03 NOTE — Telephone Encounter (Signed)
This will need to wait for her PCP. Will route to Dr. Wendi Snipes

## 2016-12-04 NOTE — Telephone Encounter (Signed)
TC to pt, instructed if she needed RF before her next scheduled appt in April to call to be seen

## 2016-12-05 ENCOUNTER — Encounter: Payer: Self-pay | Admitting: Pharmacist

## 2016-12-05 ENCOUNTER — Ambulatory Visit (INDEPENDENT_AMBULATORY_CARE_PROVIDER_SITE_OTHER): Payer: Medicare Other | Admitting: Pharmacist

## 2016-12-05 VITALS — BP 128/68 | HR 72 | Ht 64.0 in | Wt 214.0 lb

## 2016-12-05 DIAGNOSIS — R300 Dysuria: Secondary | ICD-10-CM | POA: Diagnosis not present

## 2016-12-05 DIAGNOSIS — E1169 Type 2 diabetes mellitus with other specified complication: Secondary | ICD-10-CM | POA: Diagnosis not present

## 2016-12-05 DIAGNOSIS — R7989 Other specified abnormal findings of blood chemistry: Secondary | ICD-10-CM | POA: Diagnosis not present

## 2016-12-05 DIAGNOSIS — E785 Hyperlipidemia, unspecified: Secondary | ICD-10-CM | POA: Diagnosis not present

## 2016-12-05 LAB — URINALYSIS, ROUTINE W REFLEX MICROSCOPIC
Bilirubin, UA: NEGATIVE
Glucose, UA: NEGATIVE
Ketones, UA: NEGATIVE
LEUKOCYTES UA: NEGATIVE
Nitrite, UA: POSITIVE — AB
Protein, UA: NEGATIVE
RBC, UA: NEGATIVE
SPEC GRAV UA: 1.02 (ref 1.005–1.030)
Urobilinogen, Ur: 0.2 mg/dL (ref 0.2–1.0)
pH, UA: 5 (ref 5.0–7.5)

## 2016-12-05 LAB — MICROSCOPIC EXAMINATION

## 2016-12-05 MED ORDER — INSULIN GLARGINE 100 UNIT/ML SOLOSTAR PEN: 60.0000 [IU] | PEN_INJECTOR | Freq: Every day | SUBCUTANEOUS | 1 refills | Status: DC

## 2016-12-05 MED ORDER — CIPROFLOXACIN HCL 500 MG PO TABS: 500.0000 mg | ORAL_TABLET | Freq: Two times a day (BID) | ORAL | 0 refills | Status: DC

## 2016-12-05 NOTE — Patient Instructions (Signed)
Increase Lantus to 60 units once a day.   Goal is blood glucose in morning to be less than 130.   Hypoglycemia Hypoglycemia occurs when the level of sugar (glucose) in the blood is too low. Glucose is a type of sugar that provides the body's main source of energy. Certain hormones (insulin and glucagon) control the level of glucose in the blood. Insulin lowers blood glucose, and glucagon increases blood glucose. Hypoglycemia can result from having too much insulin in the bloodstream, or from not eating enough food that contains glucose. Hypoglycemia can happen in people who do or do not have diabetes. It can develop quickly, and it can be a medical emergency. What are the causes? Hypoglycemia occurs most often in people who have diabetes. If you have diabetes, hypoglycemia may be caused by:  Diabetes medicine. More likely to occur with insulin.  Not eating enough, or not eating often enough.  Increased physical activity.  Drinking alcohol, especially when you have not eaten recently. What increases the risk? Hypoglycemia is more likely to develop in:  People who have diabetes and take medicines to lower blood glucose.  People who abuse alcohol.  People who have a severe illness. What are the signs or symptoms? Hypoglycemia may not cause any symptoms. If you have symptoms, they may include:  Hunger.  Anxiety.  Sweating and feeling clammy.  Confusion.  Dizziness or feeling light-headed.  Sleepiness.  Nausea.  Increased heart rate.  Headache.  Blurry vision.  Seizure.  Nightmares.  Tingling or numbness around the mouth, lips, or tongue.  A change in speech.  Decreased ability to concentrate.  A change in coordination.  Restless sleep.  Tremors or shakes.  Fainting.  Irritability. How is this treated Low Blood glucose (Blood glucose less than 70)? This condition can often be treated by immediately eating or drinking something that contains glucose /  sugar, such as:  3-4 sugar tablets (glucose pills).  Glucose gel, 15-gram tube.  Fruit juice, 4 oz (120 mL).  Regular soda (not diet soda), 4 oz (120 mL).  Low-fat milk, 4 oz (120 mL).  Several pieces of hard candy.  Sugar or honey, 1 Tbsp. Treating Hypoglycemia If You Have Diabetes  If you are alert and able to swallow safely, follow the 15:15 rule:  Take 15 grams of a rapid-acting carbohydrate.  Rapid-acting options include:  1 tube of glucose gel.  3 glucose pills.  6-8 pieces of hard candy.  4 oz (120 mL) of fruit juice.  4 oz (120 ml) of regular (not diet) soda.  Check your blood glucose 15 to 20 minutes after you take the carbohydrate.  If the repeat blood glucose level is still at or below 70 mg/dL (3.9 mmol/L), take 15 grams of a carbohydrate again.  If your blood glucose level does not increase above 70 mg/dL (3.9 mmol/L) after 3 tries, seek emergency medical care.  After your blood glucose level returns to normal, eat a meal or a snack within 1 hour. Treating Severe Hypoglycemia  Severe hypoglycemia is when your blood glucose level is at or below 54 mg/dL (3 mmol/L). Severe hypoglycemia is an emergency. Do not wait to see if the symptoms will go away. Get medical help right away. Call your local emergency services (911 in the [place] Do not drive yourself to the hospital. If you have severe hypoglycemia and you cannot eat or drink, you may need an injection of glucagon. A family member or close friend should learn how to  check your blood glucose and how to give you a glucagon injection. Ask your health care provider if you need to have an emergency glucagon injection kit available. Severe hypoglycemia may need to be treated in a hospital. The treatment may include getting glucose through an IV tube. You may also need treatment for the cause of your hypoglycemia. Follow these instructions at home: General instructions  Avoid any diets that cause you to not eat  enough food. Talk with your health care provider before you start any new diet.  Take over-the-counter and prescription medicines only as told by your health care provider.  Limit alcohol intake to no more than 1 drink per day for nonpregnant women and 2 drinks per day for men. One drink equals 12 oz of beer, 5 oz of wine, or 1 oz of hard liquor.  Keep all follow-up visits as told by your health care provider. This is important. If You Have Diabetes:   Make sure you know the symptoms of hypoglycemia.  Always have a rapid-acting carbohydrate snack with you to treat low blood sugar.  Follow your diabetes management plan, as told by your health care provider. Make sure you:  Take your medicines as directed.  Follow your exercise plan.  Follow your meal plan. Eat on time, and do not skip meals.  Check your blood glucose as often as directed. Make sure to check your blood glucose before and after exercise. If you exercise longer or in a different way than usual, check your blood glucose more often.  Follow your sick day plan whenever you cannot eat or drink normally. Make this plan in advance with your health care provider.  Share your diabetes management plan with people in your workplace, school, and household.  Check your urine for ketones when you are ill and as told by your health care provider.  Carry a medical alert card or wear medical alert jewelry. Contact a health care provider if:  You have problems keeping your blood glucose in your target range.  You have frequent episodes of hypoglycemia. (more than 1 episode per week) Get help right away if:  You continue to have hypoglycemia symptoms after eating or drinking something containing glucose.  Your blood glucose is at or below 54 mg/dL (3 mmol/L).  You have a seizure.  You faint. These symptoms may represent a serious problem that is an emergency. Do not wait to see if the symptoms will go away. Get medical help  right away. Call your local emergency services (911 in the [place] Do not drive yourself to the hospital.  This information is not intended to replace advice given to you by your health care provider. Make sure you discuss any questions you have with your health care provider. Document Released: 10/22/2005 Document Revised: 04/04/2016 Document Reviewed: 11/25/2015 Elsevier Interactive Patient Education  2017 [employer].

## 2016-12-05 NOTE — Progress Notes (Signed)
Patient ID: [name], female   DOB: Nov 25, 1945, 71 y.o.   MRN: [id number]   Subjective:    Diana Pratt is a 71 y.o. female who presents for an evaluation of Type 2 diabetes mellitus.   She was last seen about 6 months ago.  She is also currently taking Lantus 54 units daily, Humalog 10 units tid.   Januvia 1104m once a day (patient was without for about 7 days due to cost concerns but this has been resolved)  The patient was initially diagnosed with Type 2 diabetes mellitus in the 1990's  Known diabetic complications: peripheral neuropathy Cardiovascular risk factors: advanced age (older than 540for men, 642for women), diabetes mellitus, dyslipidemia, hypertension, obesity (BMI >= 30 kg/m2) and sedentary lifestyle  Weight trend: stable Eye exam - done recently (11/2016) with Dr TRona RavensPrior visit with CDE - yes, last visit 05/2016 Current diet: diet has improved greatly over the last 2 weeks - patient is counting CHO's and has reduced her CHO intake. Current exercise: none  - currently doing PT for shoulder (had total shoulder replacement - right)  Current monitoring regimen: home blood tests - 3 times daily  HBG reading: Usually 170's in morning.   Evening BG (after evening meal) is usually in low 200's   Home blood sugar records: range from 136 to 302 Any episodes of hypoglycemia? no  Is She on ACE inhibitor or angiotensin II receptor blocker?  Yes    valsartan + amlodipine  The following portions of the patient's history were reviewed and updated as appropriate: allergies, current medications, past family history, past medical history, past social history, past surgical history and problem list.    Objective:    BP 128/68   Pulse 72   Ht _0  (1.626 m)   Wt 214 lb (97.1 kg)   BMI 36.73 kg/m   A1c = 10.7% (10/19/2016) A1c = 9.7% (05/17/217)   Lipid Panel     Component Value Date/Time   CHOL 270 (H) 03/21/2016 1055   CHOL 243 (H) 05/29/2013 1037   TRIG 194  (H) 03/21/2016 1055   TRIG 171 (H) 12/25/2013 0855   TRIG 304 (H) 05/29/2013 1037   HDL 34 (L) 03/21/2016 1055   HDL 38 (L) 12/25/2013 0855   HDL 30 (L) 05/29/2013 1037   CHOLHDL 7.9 (H) 03/21/2016 1055   LDLCALC 197 (H) 03/21/2016 1055   LDLCALC 192 (H) 12/25/2013 0855   LDLCALC 152 (H) 05/29/2013 1037   LDLDIRECT 206 (H) 12/09/2014 0841     Lab Review Glucose (mg/dL)  Date Value  03/21/2016 85  12/09/2014 158 (H)  07/21/2014 175 (H)   Glucose, Bld (mg/dL)  Date Value  06/06/2016 151 (H)  05/31/2014 186 (H)  09/06/2013 104 (H)   CO2 (mmol/L)  Date Value  06/06/2016 23  03/21/2016 24  12/09/2014 24   BUN (mg/dL)  Date Value  06/06/2016 11  03/21/2016 11  12/09/2014 15  07/21/2014 15   Creat (mg/dL)  Date Value  05/29/2013 0.84  02/27/2013 0.89   Creatinine, Ser (mg/dL)  Date Value  06/06/2016 0.75  03/21/2016 0.79  12/09/2014 0.76   Assessment:    Diabetes Mellitus type II, under improving control.   Hyperlipidemia - patient is tolerating statin well HTN - controlled. dysuria  Plan:    1.  Rx changes:  Continue Januvia [id number]  Increase Lantus to 60 units once daily  Continue Humalog 10 units tid  Continue crestor 1037mnce  qod - labs pending 2.  Education: Reviewed 'ABCs' of diabetes management (respective goals in parentheses):  A1C (<7), blood pressure (<130/80), and cholesterol (LDL <100). 3.  Reviewed CHO counting diet and serving size recommendations. Reviewed ADA plate method to food choices and handout given and reviewed.   4.  Continue to check BG 3 times daily  Orders Placed This Encounter  Procedures  . Microscopic Examination  . Urine culture  . CMP14+EGFR  . Lipid panel  . Urinalysis, Routine w reflex microscopic  . VITAMIN D 25 Hydroxy (Vit-D Deficiency, Fractures)  . LDL Cholesterol, Direct     Follow up: 4 weeks

## 2016-12-06 ENCOUNTER — Encounter: Admitting: Physical Therapy

## 2016-12-06 LAB — CMP14+EGFR
ALT: 64 IU/L — ABNORMAL HIGH (ref 0–32)
AST: 60 IU/L — ABNORMAL HIGH (ref 0–40)
Albumin/Globulin Ratio: 1.4 (ref 1.2–2.2)
Albumin: 3.9 g/dL (ref 3.5–4.8)
Alkaline Phosphatase: 78 IU/L (ref 39–117)
BUN/Creatinine Ratio: 19 (ref 12–28)
BUN: 14 mg/dL (ref 8–27)
Bilirubin Total: 0.4 mg/dL (ref 0.0–1.2)
CALCIUM: 9.4 mg/dL (ref 8.7–10.3)
CO2: 23 mmol/L (ref 18–29)
CREATININE: 0.75 mg/dL (ref 0.57–1.00)
Chloride: 102 mmol/L (ref 96–106)
GFR, EST AFRICAN AMERICAN: 93 mL/min/{1.73_m2} (ref >59)
GFR, EST NON AFRICAN AMERICAN: 81 mL/min/{1.73_m2} (ref >59)
GLUCOSE: 180 mg/dL — AB (ref 65–99)
Globulin, Total: 2.7 g/dL (ref 1.5–4.5)
Potassium: 5.1 mmol/L (ref 3.5–5.2)
Sodium: 141 mmol/L (ref 134–144)
TOTAL PROTEIN: 6.6 g/dL (ref 6.0–8.5)

## 2016-12-06 LAB — LDL CHOLESTEROL, DIRECT: LDL Direct: 170 mg/dL — ABNORMAL HIGH (ref 0–99)

## 2016-12-06 LAB — LIPID PANEL
CHOL/HDL RATIO: 7.5 ratio — AB (ref 0.0–4.4)
CHOLESTEROL TOTAL: 246 mg/dL — AB (ref 100–199)
HDL: 33 mg/dL — ABNORMAL LOW (ref >39)
LDL CALC: 158 mg/dL — AB (ref 0–99)
Triglycerides: 276 mg/dL — ABNORMAL HIGH (ref 0–149)
VLDL CHOLESTEROL CAL: 55 mg/dL — AB (ref 5–40)

## 2016-12-06 LAB — VITAMIN D 25 HYDROXY (VIT D DEFICIENCY, FRACTURES): Vit D, 25-Hydroxy: 23.7 ng/mL — ABNORMAL LOW (ref 30.0–100.0)

## 2016-12-07 LAB — URINE CULTURE

## 2016-12-07 NOTE — Telephone Encounter (Signed)
Patient has been seen since phone call.  This encounter will now be closed 

## 2016-12-10 ENCOUNTER — Encounter: Payer: Self-pay | Admitting: Physical Therapy

## 2016-12-10 ENCOUNTER — Ambulatory Visit: Payer: Medicare Other | Attending: Orthopedic Surgery | Admitting: Physical Therapy

## 2016-12-10 DIAGNOSIS — G8929 Other chronic pain: Secondary | ICD-10-CM | POA: Diagnosis not present

## 2016-12-10 DIAGNOSIS — M25611 Stiffness of right shoulder, not elsewhere classified: Secondary | ICD-10-CM | POA: Insufficient documentation

## 2016-12-10 DIAGNOSIS — M25511 Pain in right shoulder: Secondary | ICD-10-CM | POA: Diagnosis not present

## 2016-12-10 NOTE — Therapy (Signed)
[facility] [place], [place], [address] Phone: [phone]   Fax:  [phone]  Physical Therapy Treatment  Patient Details  Name: Diana Pratt MRN: [id number] Date of Birth: January 13, 1946 Referring Provider: Justice Britain  Encounter Date: 12/10/2016      PT End of Session - 12/10/16 1117    Visit Number 37   Number of Visits 48   Date for PT Re-Evaluation 01/05/17   PT Start Time 1116   PT Stop Time 1207   PT Time Calculation (min) 51 min   Activity Tolerance Patient tolerated treatment well   Behavior During Therapy [facility] for tasks assessed/performed      Past Medical History:  Diagnosis Date  . Anxiety   . Asthma   . Back pain   . 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 COPD (chronic obstructive pulmonary disease) ([place])    dr. Kenn File  . Depression   . Diabetes mellitus   . Diabetic neuropathy ([place])   . GERD (gastroesophageal reflux disease)    occ heartburn  . Humerus fracture    right  . Hyperlipemia   . Hypertension   . Interstitial cystitis   . Osteoporosis   . Pneumonia    15 yrs ago  . PONV (postoperative nausea and vomiting)   . Shortness of breath dyspnea     Past Surgical History:  Procedure Laterality Date  . ABDOMINAL HYSTERECTOMY  03/1984   partial  . COLONOSCOPY N/A 07/19/2014   Procedure: COLONOSCOPY;  Surgeon: Danie Binder, MD;  [facility];  Service: Endoscopy;  Laterality: N/A;  9:30  . REVERSE SHOULDER ARTHROPLASTY Right 06/14/2016   Procedure: REVERSE SHOULDER ARTHROPLASTY;  Surgeon: Justice Britain, MD;  Location: [place];  Service: Orthopedics;  Laterality: Right;    There were no vitals filed for this visit.      Subjective Assessment - 12/10/16 1117    Subjective Unable to reach upper cabinets and shelves at this time.   Pertinent History DM, OP, COPD   Limitations Lifting   Patient Stated Goals get over this pain and get  back to using this arm   Currently in Pain? Yes   Pain Score 3    Pain Location Shoulder   Pain Orientation Right   Pain Descriptors / Indicators Sore   Pain Type Surgical pain   Pain Onset More than a month ago            [facility] PT Assessment - 12/10/16 0001      Assessment   Medical Diagnosis s/p R Reverse TSA   Onset Date/Surgical Date 06/14/16   Next MD Visit 12/2016   Prior Therapy no     Precautions   Precautions Shoulder   Precaution Comments SEE PROTOCOL                     [facility] Adult PT Treatment/Exercise - 12/10/16 0001      Elbow Exercises   Elbow Flexion Strengthening;Right;Seated;Bar weights/barbell;20 reps  2#     Shoulder Exercises: Supine   Flexion AROM;Right;20 reps     Shoulder Exercises: Seated   Flexion AROM;Right   Flexion Limitations 2x15 reps     Shoulder Exercises: Sidelying   External Rotation AROM;Right   External Rotation Limitations 2x20 reps   Flexion AROM;Right  2x20 reps     Shoulder Exercises: Pulleys   Flexion Other (comment)  x6 min  Other Pulley Exercises UE ranger for elevation and circles 2x10   Other Pulley Exercises wall slides x10 reps     Modalities   Modalities Electrical Stimulation;Moist Heat     Moist Heat Therapy   Number Minutes Moist Heat 15 Minutes   Moist Heat Location Shoulder     Electrical Stimulation   Electrical Stimulation Location R shoulder   Electrical Stimulation Action Pre-Mod   Electrical Stimulation Parameters 80-150 hz x15 min   Electrical Stimulation Goals Pain;Tone     Manual Therapy   Manual Therapy Passive ROM   Passive ROM PROM of R shoulder into flex/ER with holds at end range to promote improved ROM                  PT Short Term Goals - 10/15/16 1154      PT SHORT TERM GOAL #1   Title Improve R shoulder ER to 20 degrees   Status Achieved     PT SHORT TERM GOAL #2   Title decrease R shoulder pain to 5/10.   Status Achieved           PT  Long Term Goals - 11/15/16 1421      PT LONG TERM GOAL #1   Title I with HEP   Time 8   Period Weeks   Status Achieved     PT LONG TERM GOAL #2   Title Improved R shoulder ROM to [facility]   Time 8   Period Weeks   Status On-going  AROM 38 degrees ER, 94 degrees flexion 11/15/16     PT LONG TERM GOAL #3   Title Decreased pain in R shoulder to 2-3/10 with functional activities   Time 8   Period Weeks   Status On-going     PT LONG TERM GOAL #4   Title R shoulder strength 4+/5 or better to improve function (Not to be addressed until okayed by protocol/MD)   Time 8   Period Weeks   Status On-going               Plan - 12/10/16 1204    Clinical Impression Statement Patient continues to present with R shoulder soreness and R shoulder fatigue/weakness noted throughout treatment. Anterior shoulder/Bicep weakness noted with R shouler wall slides as patient compensated with shoulder abduction. Patient required max tactile cueing today along with moderate VCs to avoid compensation and to strengthen appropriate musculature. AROM R shoulder flexion emphasized today in all positions to emphasize functional ROM and strengthening. Firm end feels noted in R shoulder flexion and ER in supine with joint tightness increasing as max shoulder flexion approached. Normal modalities response noted following removal of the modalities.   Rehab Potential Excellent   Clinical Impairments Affecting Rehab Potential 06/14/16 surgury/ current 22 week 11/15/16   PT Frequency 3x / week   PT Duration 2 weeks   PT Treatment/Interventions ADLs/Self Care Home Management;Electrical Stimulation;Patient/family education;Neuromuscular re-education;Therapeutic exercise;Manual techniques;Passive range of motion;Vasopneumatic Device   PT Next Visit Plan cont with POC per protocol given by MD (see binder) MD.    PT Home Exercise Plan elbow, wrist ROM and hand squeezes   Consulted and Agree with Plan of Care Patient       Patient will benefit from skilled therapeutic intervention in order to improve the following deficits and impairments:  Decreased range of motion, Impaired UE functional use, Pain, Decreased strength  Visit Diagnosis: Chronic right shoulder pain  Stiffness of right shoulder, not elsewhere classified  Problem List Patient Active Problem List   Diagnosis Date Noted  . 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 Abdominal pain, acute, bilateral lower quadrant 07/01/2014  . Transaminitis 07/01/2014  . COPD (chronic obstructive pulmonary disease) ([place])   . Asthma   . Interstitial cystitis   . Unspecified vitamin D deficiency 05/29/2013  . Lumbar radiculopathy 05/29/2013  . Back strain 05/29/2013  . Unspecified asthma(493.90) 02/27/2013  . Depression with anxiety 02/22/2011  . Uncontrolled type 2 diabetes mellitus with insulin therapy ([place]) 02/22/2011  . Hyperlipemia 02/22/2011  . HTN (hypertension) 02/22/2011    Wynelle Fanny, PTA 12/10/2016, 12:11 PM  [facility] [address] [place], [place], [address] Phone: [phone]   Fax:  [phone]  Name: Diana Pratt MRN: [id number] Date of Birth: 08-05-1946

## 2016-12-12 ENCOUNTER — Telehealth: Payer: Self-pay | Admitting: Family Medicine

## 2016-12-12 MED ORDER — VITAMIN D (ERGOCALCIFEROL) 1.25 MG (50000 UNIT) PO CAPS: 50000.0000 [IU] | ORAL_CAPSULE | ORAL | 0 refills | Status: DC

## 2016-12-12 NOTE — Telephone Encounter (Signed)
Pt aware of results 

## 2016-12-12 NOTE — Telephone Encounter (Signed)
Lab review from 1/31:  Choletserol is very high, however she appears to not be able to tolerate statins or zetia. We can discuss a possible alternative on our next visit (welchol or PCSK-9 inhibitors)  Her vitamin D was low, I will send replacement weekly dose mfor 12 weeks, then recheck and change to 200 IU vitamin D3 daily.   Her glucose was elevated consitent with diabetes. Her kidney function and electrolytes look good. Her liver enzymes are elevated but improved since her last check 8 months ago. We can discuss possible reasons this is occurring next month at follow up.   Laroy Apple, MD [facility] 12/12/2016, 1:24 PM

## 2016-12-12 NOTE — Telephone Encounter (Signed)
Patient wants lab results from 1/31. There were notes on the UA but not the rest of her labs. Please review.

## 2016-12-13 ENCOUNTER — Ambulatory Visit: Payer: Medicare Other | Admitting: *Deleted

## 2016-12-13 DIAGNOSIS — G8929 Other chronic pain: Secondary | ICD-10-CM | POA: Diagnosis not present

## 2016-12-13 DIAGNOSIS — M25611 Stiffness of right shoulder, not elsewhere classified: Secondary | ICD-10-CM | POA: Diagnosis not present

## 2016-12-13 DIAGNOSIS — M25511 Pain in right shoulder: Secondary | ICD-10-CM

## 2016-12-13 NOTE — Therapy (Signed)
[facility], [place], [address] Phone: [phone]   Fax:  [phone]  Physical Therapy Treatment  Patient Details  Name: Diana Pratt MRN: [id number] Date of Birth: [birth year] Referring Provider: Justice Pratt  Encounter Date: 12/13/2016      PT End of Session - 12/13/16 1206    Visit Number 38   Number of Visits 48   Date for PT Re-Evaluation 01/05/17   PT Start Time 1116   PT Stop Time 1215   PT Time Calculation (min) 59 min      Past Medical History:  Diagnosis Date  . Anxiety   . Asthma   . Back pain   . 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 COPD (chronic obstructive pulmonary disease) ([place])    dr. Kenn Pratt  . Depression   . Diabetes mellitus   . Diabetic neuropathy ([place])   . GERD (gastroesophageal reflux disease)    occ heartburn  . Humerus fracture    right  . Hyperlipemia   . Hypertension   . Interstitial cystitis   . Osteoporosis   . Pneumonia    15 yrs ago  . PONV (postoperative nausea and vomiting)   . Shortness of breath dyspnea     Past Surgical History:  Procedure Laterality Date  . ABDOMINAL HYSTERECTOMY  03/1984   partial  . COLONOSCOPY N/A 07/19/2014   Procedure: COLONOSCOPY;  Surgeon: Diana Binder, Diana Pratt;  [facility];  Service: Endoscopy;  Laterality: N/A;  9:30  . REVERSE SHOULDER ARTHROPLASTY Right 06/14/2016   Procedure: REVERSE SHOULDER ARTHROPLASTY;  Surgeon: Diana Britain, Diana Pratt;  Location: [facility];  Service: Orthopedics;  Laterality: Right;    There were no vitals filed for this visit.      Subjective Assessment - 12/13/16 1125    Subjective Unable to reach upper cabinets and shelves at this time.   Pertinent History DM, OP, COPD   Limitations Lifting   Currently in Pain? Yes   Pain Score 3    Pain Location Shoulder   Pain Orientation Right   Pain Descriptors / Indicators Sore   Pain Type Surgical pain   Pain  Onset More than a month ago                         [facility] Adult PT Treatment/Exercise - 12/13/16 0001      Elbow Exercises   Elbow Flexion Strengthening;Right;Seated;Bar weights/barbell;20 reps  1#2x10, 2#2x10     Shoulder Exercises: Supine   Flexion AROM;Right;20 reps     Shoulder Exercises: Seated   Flexion AROM;Right;AAROM  3x10, AAROM from 100 degrees to end-range     Shoulder Exercises: Sidelying   External Rotation --   External Rotation Limitations --   Flexion --  2x20 reps     Shoulder Exercises: Pulleys   Flexion Other (comment)  x6 min     Modalities   Modalities Electrical Stimulation;Moist Heat     Moist Heat Therapy   Number Minutes Moist Heat 15 Minutes   Moist Heat Location Shoulder     Electrical Stimulation   Electrical Stimulation Location R shoulder Premod  80-150hz  x 15 mins   Electrical Stimulation Goals Pain;Tone     Manual Therapy   Manual Therapy Passive ROM   Passive ROM PROM of R shoulder into flex/ER with holds at end range to promote improved ROM Manual resistance  in supine from 90 degrees of flexion to 125.                  PT Short Term Goals - 10/15/16 1154      PT SHORT TERM GOAL #1   Title Improve R shoulder ER to 20 degrees   Status Achieved     PT SHORT TERM GOAL #2   Title decrease R shoulder pain to 5/10.   Status Achieved           PT Long Term Goals - 11/15/16 1421      PT LONG TERM GOAL #1   Title I with HEP   Time 8   Period Weeks   Status Achieved     PT LONG TERM GOAL #2   Title Improved R shoulder ROM to [facility]   Time 8   Period Weeks   Status On-going  AROM 38 degrees ER, 94 degrees flexion 11/15/16     PT LONG TERM GOAL #3   Title Decreased pain in R shoulder to 2-3/10 with functional activities   Time 8   Period Weeks   Status On-going     PT LONG TERM GOAL #4   Title R shoulder strength 4+/5 or better to improve function (Not to be addressed until okayed by  protocol/Diana Pratt)   Time 8   Period Weeks   Status On-going               Plan - 12/13/16 1207    Clinical Impression Statement Pt did fairly well today with PT for RT shldr. Her CC is still weakness and pain when raising her arm. We focused a lot on AROM for elevation and AAROM to finish to end-range in sitting and standing. She was able to reach 110 degrees  flexion in standing, AAROM to 120 degrees and 130 degrees in supine PROM. LTGs are ongoing due to deficits.   Clinical Impairments Affecting Rehab Potential 06/14/16 surgury/ current 22 week 11/15/16   PT Frequency 3x / week   PT Duration 2 weeks   PT Treatment/Interventions ADLs/Self Care Home Management;Electrical Stimulation;Patient/family education;Neuromuscular re-education;Therapeutic exercise;Manual techniques;Passive range of motion;Vasopneumatic Device   PT Next Visit Plan cont with POC per protocol given by Diana Pratt (see Pratt) Diana Pratt.    PT Home Exercise Plan elbow, wrist ROM and hand squeezes   Consulted and Agree with Plan of Care Patient      Patient will benefit from skilled therapeutic intervention in order to improve the following deficits and impairments:  Decreased range of motion, Impaired UE functional use, Pain, Decreased strength  Visit Diagnosis: Chronic right shoulder pain  Stiffness of right shoulder, not elsewhere classified  Right shoulder pain, unspecified chronicity     Problem List Patient Active Problem List   Diagnosis Date Noted  . 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 Abdominal pain, acute, bilateral lower quadrant 07/01/2014  . Transaminitis 07/01/2014  . COPD (chronic obstructive pulmonary disease) ([place])   . Asthma   . Interstitial cystitis   . Unspecified vitamin D deficiency 05/29/2013  . Lumbar radiculopathy 05/29/2013  . Back strain 05/29/2013  . Unspecified asthma(493.90) 02/27/2013  . Depression with anxiety 02/22/2011  . Uncontrolled type 2 diabetes mellitus with  insulin therapy ([place]) 02/22/2011  . Hyperlipemia 02/22/2011  . HTN (hypertension) 02/22/2011    Diana Pratt,Diana Pratt, Diana Pratt 12/13/2016, 12:13 PM  [facility] [address] [place], [place], [address] Phone: [phone]   Fax:  [phone]  Name:  Diana Pratt MRN: [id number] Date of Birth: July 28, 1946

## 2016-12-17 ENCOUNTER — Encounter: Payer: Self-pay | Admitting: Physical Therapy

## 2016-12-17 ENCOUNTER — Ambulatory Visit: Payer: Medicare Other | Admitting: Physical Therapy

## 2016-12-17 DIAGNOSIS — M25511 Pain in right shoulder: Principal | ICD-10-CM

## 2016-12-17 DIAGNOSIS — G8929 Other chronic pain: Secondary | ICD-10-CM

## 2016-12-17 DIAGNOSIS — M25611 Stiffness of right shoulder, not elsewhere classified: Secondary | ICD-10-CM | POA: Diagnosis not present

## 2016-12-17 NOTE — Therapy (Signed)
[facility] [place], [place], [address] Phone: [phone]   Fax:  [phone]  Physical Therapy Treatment  Patient Details  Name: Diana Pratt MRN: [id number] Date of Birth: 1946/10/31 Referring Provider: Justice Britain  Encounter Date: 12/17/2016      PT End of Session - 12/17/16 1120    Visit Number 39   Number of Visits 48   Date for PT Re-Evaluation 01/05/17   PT Start Time 1118   PT Stop Time 1206   PT Time Calculation (min) 48 min   Activity Tolerance Patient tolerated treatment well   Behavior During Therapy [facility] for tasks assessed/performed      Past Medical History:  Diagnosis Date  . Anxiety   . Asthma   . Back pain   . 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 COPD (chronic obstructive pulmonary disease) ([place])    dr. Kenn File  . Depression   . Diabetes mellitus   . Diabetic neuropathy ([place])   . GERD (gastroesophageal reflux disease)    occ heartburn  . Humerus fracture    right  . Hyperlipemia   . Hypertension   . Interstitial cystitis   . Osteoporosis   . Pneumonia    15 yrs ago  . PONV (postoperative nausea and vomiting)   . Shortness of breath dyspnea     Past Surgical History:  Procedure Laterality Date  . ABDOMINAL HYSTERECTOMY  03/1984   partial  . COLONOSCOPY N/A 07/19/2014   Procedure: COLONOSCOPY;  Surgeon: Danie Binder, MD;  [facility];  Service: Endoscopy;  Laterality: N/A;  9:30  . REVERSE SHOULDER ARTHROPLASTY Right 06/14/2016   Procedure: REVERSE SHOULDER ARTHROPLASTY;  Surgeon: Justice Britain, MD;  Location: [facility];  Service: Orthopedics;  Laterality: Right;    There were no vitals filed for this visit.      Subjective Assessment - 12/17/16 1120    Subjective Continues to have soreness in R shoulder.   Pertinent History DM, OP, COPD   Limitations Lifting   Patient Stated Goals get over this pain and get back to using  this arm   Currently in Pain? Yes   Pain Score 3    Pain Location Shoulder   Pain Orientation Right   Pain Descriptors / Indicators Sore   Pain Type Surgical pain   Pain Onset More than a month ago            [facility] PT Assessment - 12/17/16 0001      Assessment   Medical Diagnosis s/p R Reverse TSA   Onset Date/Surgical Date 06/14/16   Next MD Visit 12/19/2016   Prior Therapy no     Precautions   Precautions Shoulder   Precaution Comments SEE PROTOCOL     ROM / Strength   AROM / PROM / Strength AROM     AROM   Overall AROM  Deficits   AROM Assessment Site Shoulder   Right/Left Shoulder Right   Right Shoulder Flexion 129 Degrees   Right Shoulder Internal Rotation 60 Degrees   Right Shoulder External Rotation 48 Degrees                     [facility] Adult PT Treatment/Exercise - 12/17/16 0001      Shoulder Exercises: Pulleys   Flexion Other (comment)  x5 min   Other Pulley Exercises UE ranger for elevation and circles 2x10  Other Pulley Exercises wall slides x20 reps     Modalities   Modalities Electrical Stimulation;Moist Heat     Moist Heat Therapy   Number Minutes Moist Heat 15 Minutes   Moist Heat Location Shoulder     Electrical Stimulation   Electrical Stimulation Location R Deltoids   Electrical Stimulation Action Pre-Mod   Electrical Stimulation Parameters 80-150 hz x15 min   Electrical Stimulation Goals Pain;Tone     Manual Therapy   Manual Therapy Soft tissue mobilization;Myofascial release;Passive ROM   Soft tissue mobilization STW to R Teres, infraspinatus region to reduce tone and soreness   Myofascial Release IASTW to R deltoids to reduce tone and soreness   Passive ROM PROM of R shoulder into ER/IR with holds at end range to promote improved ROM Manual resistance in supine from 90 degrees of flexion to 125.                  PT Short Term Goals - 10/15/16 1154      PT SHORT TERM GOAL #1   Title Improve R shoulder ER  to 20 degrees   Status Achieved     PT SHORT TERM GOAL #2   Title decrease R shoulder pain to 5/10.   Status Achieved           PT Long Term Goals - 11/15/16 1421      PT LONG TERM GOAL #1   Title I with HEP   Time 8   Period Weeks   Status Achieved     PT LONG TERM GOAL #2   Title Improved R shoulder ROM to [facility]   Time 8   Period Weeks   Status On-going  AROM 38 degrees ER, 94 degrees flexion 11/15/16     PT LONG TERM GOAL #3   Title Decreased pain in R shoulder to 2-3/10 with functional activities   Time 8   Period Weeks   Status On-going     PT LONG TERM GOAL #4   Title R shoulder strength 4+/5 or better to improve function (Not to be addressed until okayed by protocol/MD)   Time 8   Period Weeks   Status On-going               Plan - 12/17/16 1216    Clinical Impression Statement Patient tolerated today's treatment well as R shoulder flexion has improved. R shoulder IR and ER ROM remain relatively the same and weakness still present in R shoulder. Fatigue and trembling reported by patient in RUE with wall slides. Patient's R shoulder flexion in sitting 128 deg, ER 48 deg, IR 60 deg in supine. IASTW and STW completed to R deltoids and posterior shoulder as patient continues to experience tightness and soreness in the musculature. Good petiche response noted to R deltoids during IASTW. Soreness predominately experienced in R deltoids than in R teres and infraspinatus region. Normal modalities response noted following removal of the modalities.   Rehab Potential Excellent   Clinical Impairments Affecting Rehab Potential 06/14/16 surgury/ current 22 week 11/15/16   PT Frequency 3x / week   PT Duration 2 weeks   PT Treatment/Interventions ADLs/Self Care Home Management;Electrical Stimulation;Patient/family education;Neuromuscular re-education;Therapeutic exercise;Manual techniques;Passive range of motion;Vasopneumatic Device   PT Next Visit Plan cont with POC per  protocol given by MD (see binder) MD.    PT Home Exercise Plan elbow, wrist ROM and hand squeezes   Consulted and Agree with Plan of Care Patient  Patient will benefit from skilled therapeutic intervention in order to improve the following deficits and impairments:  Decreased range of motion, Impaired UE functional use, Pain, Decreased strength  Visit Diagnosis: Chronic right shoulder pain  Stiffness of right shoulder, not elsewhere classified     Problem List Patient Active Problem List   Diagnosis Date Noted  . 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 Abdominal pain, acute, bilateral lower quadrant 07/01/2014  . Transaminitis 07/01/2014  . COPD (chronic obstructive pulmonary disease) ([place])   . Asthma   . Interstitial cystitis   . Unspecified vitamin D deficiency 05/29/2013  . Lumbar radiculopathy 05/29/2013  . Back strain 05/29/2013  . Unspecified asthma(493.90) 02/27/2013  . Depression with anxiety 02/22/2011  . Uncontrolled type 2 diabetes mellitus with insulin therapy ([place]) 02/22/2011  . Hyperlipemia 02/22/2011  . HTN (hypertension) 02/22/2011    Ahmed Prima, PTA 12/17/16 2:23 PM Mali Applegate MPT [facility] [address] [place], [place], [address] Phone: [phone]   Fax:  [phone]  Name: Diana Pratt MRN: [id number] Date of Birth: 13-Feb-1946

## 2016-12-19 DIAGNOSIS — Z96611 Presence of right artificial shoulder joint: Secondary | ICD-10-CM | POA: Diagnosis not present

## 2016-12-20 ENCOUNTER — Ambulatory Visit: Payer: Medicare Other | Admitting: *Deleted

## 2016-12-20 DIAGNOSIS — M25511 Pain in right shoulder: Principal | ICD-10-CM

## 2016-12-20 DIAGNOSIS — G8929 Other chronic pain: Secondary | ICD-10-CM

## 2016-12-20 DIAGNOSIS — M25611 Stiffness of right shoulder, not elsewhere classified: Secondary | ICD-10-CM | POA: Diagnosis not present

## 2016-12-20 NOTE — Therapy (Signed)
[facility] [place], [place], [address] Phone: [phone]   Fax:  [phone]  Physical Therapy Treatment  Patient Details  Name: Diana Pratt MRN: [id number] Date of Birth: 06-03-1946 Referring Provider: Justice Britain  Encounter Date: 12/20/2016      PT End of Session - 12/20/16 1133    Visit Number 40   Number of Visits 48   Date for PT Re-Evaluation 01/16/17   PT Start Time 1120   PT Stop Time 1220   PT Time Calculation (min) 60 min      Past Medical History:  Diagnosis Date  . Anxiety   . Asthma   . Back pain   . 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 COPD (chronic obstructive pulmonary disease) ([place])    dr. Kenn File  . Depression   . Diabetes mellitus   . Diabetic neuropathy ([place])   . GERD (gastroesophageal reflux disease)    occ heartburn  . Humerus fracture    right  . Hyperlipemia   . Hypertension   . Interstitial cystitis   . Osteoporosis   . Pneumonia    15 yrs ago  . PONV (postoperative nausea and vomiting)   . Shortness of breath dyspnea     Past Surgical History:  Procedure Laterality Date  . ABDOMINAL HYSTERECTOMY  03/1984   partial  . COLONOSCOPY N/A 07/19/2014   Procedure: COLONOSCOPY;  Surgeon: Danie Binder, MD;  [facility];  Service: Endoscopy;  Laterality: N/A;  9:30  . REVERSE SHOULDER ARTHROPLASTY Right 06/14/2016   Procedure: REVERSE SHOULDER ARTHROPLASTY;  Surgeon: Justice Britain, MD;  Location: [facility];  Service: Orthopedics;  Laterality: Right;    There were no vitals filed for this visit.      Subjective Assessment - 12/20/16 1124    Subjective Went to MD yesterday. Continue as per MD   Patient is accompained by: Family member   Pertinent History DM, OP, COPD   Patient Stated Goals get over this pain and get back to using this arm   Currently in Pain? Yes   Pain Score 3    Pain Location Shoulder   Pain Orientation  Right   Pain Descriptors / Indicators Sore                         [facility] Adult PT Treatment/Exercise - 12/20/16 0001      Elbow Exercises   Elbow Flexion Strengthening;Right;Seated;Bar weights/barbell;20 reps     Shoulder Exercises: Supine   Flexion AROM;Right;20 reps;10 reps  1#     Shoulder Exercises: Seated   Flexion AROM;Right;AAROM  3x10, AAROM from 100 degrees to end-range     Shoulder Exercises: Pulleys   Flexion Other (comment)  x5 min   Other Pulley Exercises UE ranger for elevation and circles 3x10     Modalities   Modalities Electrical Stimulation;Moist Heat     Moist Heat Therapy   Number Minutes Moist Heat 15 Minutes   Moist Heat Location Shoulder     Electrical Stimulation   Electrical Stimulation Location R shoulder Premod  80-150hz  x 15 mins   Electrical Stimulation Goals Pain;Tone     Manual Therapy   Manual Therapy Soft tissue mobilization;Myofascial release;Passive ROM   Passive ROM PROM of R shoulder into flex/ER with holds at end range to promote improved ROM Manual resistance in sitting for ER, standing AAROM for  flexion above 90 degrees to 125.                  PT Short Term Goals - 10/15/16 1154      PT SHORT TERM GOAL #1   Title Improve R shoulder ER to 20 degrees   Status Achieved     PT SHORT TERM GOAL #2   Title decrease R shoulder pain to 5/10.   Status Achieved           PT Long Term Goals - 11/15/16 1421      PT LONG TERM GOAL #1   Title I with HEP   Time 8   Period Weeks   Status Achieved     PT LONG TERM GOAL #2   Title Improved R shoulder ROM to [facility]   Time 8   Period Weeks   Status On-going  AROM 38 degrees ER, 94 degrees flexion 11/15/16     PT LONG TERM GOAL #3   Title Decreased pain in R shoulder to 2-3/10 with functional activities   Time 8   Period Weeks   Status On-going     PT LONG TERM GOAL #4   Title R shoulder strength 4+/5 or better to improve function (Not to be  addressed until okayed by protocol/MD)   Time 8   Period Weeks   Status On-going               Plan - 12/20/16 1135    Clinical Impression Statement Pt went to MD yesterday and Xrays looked good and He ordered PT 2xwk x 4 more weeks  for ROM and strengthening. Her ROM today Flexion 128 degrees, ER 48 degrees IR 60 degrees. LTGs for ROM and strength are ongoing due to deficits   Rehab Potential Excellent   Clinical Impairments Affecting Rehab Potential 06/14/16 surgury/ current 22 week 11/15/16   PT Frequency 3x / week   PT Duration 2 weeks   PT Treatment/Interventions ADLs/Self Care Home Management;Electrical Stimulation;Patient/family education;Neuromuscular re-education;Therapeutic exercise;Manual techniques;Passive range of motion;Vasopneumatic Device   PT Next Visit Plan cont with POC per protocol given by MD (see binder) MD.    PT Home Exercise Plan elbow, wrist ROM and hand squeezes   Consulted and Agree with Plan of Care Patient      Patient will benefit from skilled therapeutic intervention in order to improve the following deficits and impairments:  Decreased range of motion, Impaired UE functional use, Pain, Decreased strength  Visit Diagnosis: Chronic right shoulder pain  Stiffness of right shoulder, not elsewhere classified     Problem List Patient Active Problem List   Diagnosis Date Noted  . 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 Abdominal pain, acute, bilateral lower quadrant 07/01/2014  . Transaminitis 07/01/2014  . COPD (chronic obstructive pulmonary disease) ([place])   . Asthma   . Interstitial cystitis   . Unspecified vitamin D deficiency 05/29/2013  . Lumbar radiculopathy 05/29/2013  . Back strain 05/29/2013  . Unspecified asthma(493.90) 02/27/2013  . Depression with anxiety 02/22/2011  . Uncontrolled type 2 diabetes mellitus with insulin therapy ([place]) 02/22/2011  . Hyperlipemia 02/22/2011  . HTN (hypertension) 02/22/2011     RAMSEUR,CHRIS, PTA 12/20/2016, 12:29 PM  [facility] [address] [place], [place], [address] Phone: [phone]   Fax:  [phone]  Name: Diana Pratt MRN: [id number] Date of Birth: [birth year]

## 2016-12-24 ENCOUNTER — Ambulatory Visit: Payer: Medicare Other | Admitting: Physical Therapy

## 2016-12-24 ENCOUNTER — Encounter: Payer: Self-pay | Admitting: Physical Therapy

## 2016-12-24 DIAGNOSIS — M25611 Stiffness of right shoulder, not elsewhere classified: Secondary | ICD-10-CM | POA: Diagnosis not present

## 2016-12-24 DIAGNOSIS — M25511 Pain in right shoulder: Secondary | ICD-10-CM | POA: Diagnosis not present

## 2016-12-24 DIAGNOSIS — G8929 Other chronic pain: Secondary | ICD-10-CM

## 2016-12-24 NOTE — Therapy (Signed)
[name], [place], [address] Phone: [phone]   Fax:  [phone]  Physical Therapy Treatment  Patient Details  Name: Diana Pratt MRN: [id number] Date of Birth: 12-Mar-1946 Referring Provider: Justice Britain  Encounter Date: 12/24/2016      PT End of Session - 12/24/16 0811    Visit Number 41   Number of Visits 48   Date for PT Re-Evaluation 01/16/17   PT Start Time 0734   PT Stop Time 0829   PT Time Calculation (min) 55 min   Activity Tolerance Patient tolerated treatment well   Behavior During Therapy [facility] for tasks assessed/performed      Past Medical History:  Diagnosis Date  . Anxiety   . Asthma   . Back pain   . 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 COPD (chronic obstructive pulmonary disease) ([place])    dr. Kenn File  . Depression   . Diabetes mellitus   . Diabetic neuropathy ([place])   . GERD (gastroesophageal reflux disease)    occ heartburn  . Humerus fracture    right  . Hyperlipemia   . Hypertension   . Interstitial cystitis   . Osteoporosis   . Pneumonia    15 yrs ago  . PONV (postoperative nausea and vomiting)   . Shortness of breath dyspnea     Past Surgical History:  Procedure Laterality Date  . ABDOMINAL HYSTERECTOMY  03/1984   partial  . COLONOSCOPY N/A 07/19/2014   Procedure: COLONOSCOPY;  Surgeon: Danie Binder, MD;  [facility];  Service: Endoscopy;  Laterality: N/A;  9:30  . REVERSE SHOULDER ARTHROPLASTY Right 06/14/2016   Procedure: REVERSE SHOULDER ARTHROPLASTY;  Surgeon: Justice Britain, MD;  Location: [facility];  Service: Orthopedics;  Laterality: Right;    There were no vitals filed for this visit.      Subjective Assessment - 12/24/16 0736    Subjective Patient arrived with ongoing stiffness and soreness in shoulder   Pertinent History DM, OP, COPD   Limitations Lifting   Patient Stated Goals get over this pain and  get back to using this arm   Currently in Pain? Yes   Pain Score 3    Pain Location Shoulder   Pain Orientation Right   Pain Descriptors / Indicators Sore;Dull   Pain Type Surgical pain   Pain Onset More than a month ago   Aggravating Factors  certain movements   Pain Relieving Factors at rest            [facility] PT Assessment - 12/24/16 0001      AROM   AROM Assessment Site Shoulder   Right/Left Shoulder Right   Right Shoulder Flexion 115 Degrees  standing    Right Shoulder External Rotation 67 Degrees     PROM   PROM Assessment Site Shoulder   Right/Left Shoulder Right   Right Shoulder Flexion 126 Degrees   Right Shoulder External Rotation 55 Degrees                     [facility] Adult PT Treatment/Exercise - 12/24/16 0001      Shoulder Exercises: Supine   Flexion AROM;Right;20 reps;10 reps   Shoulder Flexion Weight (lbs) 1     Shoulder Exercises: Standing   Other Standing Exercises standing wall slide with eccentric lowering x10-15     Shoulder Exercises: Pulleys   Flexion Other (comment)  87min   Other Pulley Exercises UE ranger for elevation and circles 3x10     Moist Heat Therapy   Number Minutes Moist Heat 15 Minutes   Moist Heat Location Shoulder     Electrical Stimulation   Electrical Stimulation Location R shoulder Premod  80-150hz  x 15 mins   Electrical Stimulation Goals Pain;Tone     Manual Therapy   Manual Therapy Soft tissue mobilization;Myofascial release;Passive ROM   Soft tissue mobilization gentle manual STW to right shoulder to reduce pain and tighness in muscles   Passive ROM PROM for right shoulder flexion and ER with gentle holds, rhythmic stabs for IR/ER in scaption                  PT Short Term Goals - 10/15/16 1154      PT SHORT TERM GOAL #1   Title Improve R shoulder ER to 20 degrees   Status Achieved     PT SHORT TERM GOAL #2   Title decrease R shoulder pain to 5/10.   Status Achieved           PT  Long Term Goals - 12/24/16 0817      PT LONG TERM GOAL #1   Title I with HEP   Time 8   Period Weeks   Status Achieved     PT LONG TERM GOAL #2   Title Improved R shoulder ROM to [facility]   Time 8   Period Weeks   Status On-going     PT LONG TERM GOAL #3   Title Decreased pain in R shoulder to 2-3/10 with functional activities   Time 8   Period Weeks   Status --  5/10 with ADL's 12/24/16     PT LONG TERM GOAL #4   Title R shoulder strength 4+/5 or better to improve function (Not to be addressed until okayed by protocol/MD)   Time 8   Period Weeks   Status On-going               Plan - 12/24/16 [id number]    Clinical Impression Statement Patient tolerated treatment well today with no reports of increased pain only some fatigue with antigravity active exercises. Patient has improved overall ROM for both flexion and ER. Patient has reported being able to perform light ADL's yet pain can increase up to 5/10 pain. Patient progressing toward goals yet unable to meet due to strength and pain deficts.    Rehab Potential Excellent   Clinical Impairments Affecting Rehab Potential 06/14/16 surgury/ current 27 week 12/20/16   PT Frequency 3x / week   PT Duration 2 weeks   PT Treatment/Interventions ADLs/Self Care Home Management;Electrical Stimulation;Patient/family education;Neuromuscular re-education;Therapeutic exercise;Manual techniques;Passive range of motion;Vasopneumatic Device   PT Next Visit Plan cont with POC per protocol given by MD (see binder) MD.    Consulted and Agree with Plan of Care Patient      Patient will benefit from skilled therapeutic intervention in order to improve the following deficits and impairments:  Decreased range of motion, Impaired UE functional use, Pain, Decreased strength  Visit Diagnosis: Chronic right shoulder pain  Stiffness of right shoulder, not elsewhere classified     Problem List Patient Active Problem List   Diagnosis Date Noted  . S/p  reverse total shoulder arthroplasty 06/14/2016  . Abdominal pain, acute, bilateral lower quadrant 07/01/2014  . Transaminitis 07/01/2014  . COPD (chronic obstructive pulmonary disease) ([place])   . Asthma   . Interstitial cystitis   .  Unspecified vitamin D deficiency 05/29/2013  . Lumbar radiculopathy 05/29/2013  . Back strain 05/29/2013  . Unspecified asthma(493.90) 02/27/2013  . Depression with anxiety 02/22/2011  . Uncontrolled type 2 diabetes mellitus with insulin therapy ([place]) 02/22/2011  . Hyperlipemia 02/22/2011  . HTN (hypertension) 02/22/2011    Tawonda Legaspi P, PTA 12/24/2016, 8:29 AM  [facility] [place], [place], [address] Phone: [phone]   Fax:  [phone]  Name: PAMULA KLEEN MRN: [id number] Date of Birth: 11-Jun-1946

## 2016-12-27 ENCOUNTER — Encounter: Admitting: Physical Therapy

## 2016-12-31 ENCOUNTER — Ambulatory Visit: Payer: Medicare Other | Admitting: Physical Therapy

## 2016-12-31 ENCOUNTER — Encounter: Payer: Self-pay | Admitting: Physical Therapy

## 2016-12-31 DIAGNOSIS — M25611 Stiffness of right shoulder, not elsewhere classified: Secondary | ICD-10-CM

## 2016-12-31 DIAGNOSIS — M25511 Pain in right shoulder: Principal | ICD-10-CM

## 2016-12-31 DIAGNOSIS — G8929 Other chronic pain: Secondary | ICD-10-CM

## 2016-12-31 NOTE — Therapy (Signed)
[facility] [place], [place], [address] Phone: [phone]   Fax:  [phone]  Physical Therapy Treatment  Patient Details  Name: Diana Pratt MRN: [id number] Date of Birth: 1945-11-21 Referring Provider: Justice Britain  Encounter Date: 12/31/2016      PT End of Session - 12/31/16 1129    Visit Number 42   Number of Visits 48   Date for PT Re-Evaluation 01/16/17   PT Start Time 1116   PT Stop Time 1200   PT Time Calculation (min) 44 min   Activity Tolerance Patient tolerated treatment well   Behavior During Therapy [facility] for tasks assessed/performed      Past Medical History:  Diagnosis Date  . Anxiety   . Asthma   . Back pain   . 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 COPD (chronic obstructive pulmonary disease) ([place])    dr. Kenn File  . Depression   . Diabetes mellitus   . Diabetic neuropathy ([place])   . GERD (gastroesophageal reflux disease)    occ heartburn  . Humerus fracture    right  . Hyperlipemia   . Hypertension   . Interstitial cystitis   . Osteoporosis   . Pneumonia    15 yrs ago  . PONV (postoperative nausea and vomiting)   . Shortness of breath dyspnea     Past Surgical History:  Procedure Laterality Date  . ABDOMINAL HYSTERECTOMY  03/1984   partial  . COLONOSCOPY N/A 07/19/2014   Procedure: COLONOSCOPY;  Surgeon: Danie Binder, MD;  [facility];  Service: Endoscopy;  Laterality: N/A;  9:30  . REVERSE SHOULDER ARTHROPLASTY Right 06/14/2016   Procedure: REVERSE SHOULDER ARTHROPLASTY;  Surgeon: Justice Britain, MD;  Location: [facility];  Service: Orthopedics;  Laterality: Right;    There were no vitals filed for this visit.      Subjective Assessment - 12/31/16 1125    Subjective Patient arrived with ongoing stiffness and soreness in shoulder   Pertinent History DM, OP, COPD   Limitations Lifting   Patient Stated Goals get over this pain and  get back to using this arm   Currently in Pain? Yes   Pain Score 3    Pain Location Shoulder   Pain Orientation Right   Pain Descriptors / Indicators Dull;Sore   Pain Type Surgical pain   Pain Onset More than a month ago   Pain Frequency Constant   Aggravating Factors  increased activity   Pain Relieving Factors at rest            [facility] PT Assessment - 12/31/16 0001      AROM   AROM Assessment Site Shoulder   Right/Left Shoulder Right   Right Shoulder Flexion 120 Degrees   Right Shoulder External Rotation 60 Degrees     PROM   Right Shoulder Flexion 138 Degrees   Right Shoulder External Rotation 65 Degrees                     [facility] Adult PT Treatment/Exercise - 12/31/16 0001      Shoulder Exercises: Standing   Other Standing Exercises standing wall slide with eccentric lowering x10-15   Other Standing Exercises RW4 with yellow t-band x20 each     Shoulder Exercises: Pulleys   Flexion Other (comment)  5 min     Moist Heat Therapy   Number Minutes Moist Heat 15 Minutes  Moist Heat Location Shoulder     Electrical Stimulation   Electrical Stimulation Location R shoulder Premod  80-150hz  x 15 mins   Electrical Stimulation Goals Pain;Tone     Manual Therapy   Manual Therapy Passive ROM   Passive ROM PROM for right shoulder flexion and ER with gentle holds                  PT Short Term Goals - 10/15/16 1154      PT SHORT TERM GOAL #1   Title Improve R shoulder ER to 20 degrees   Status Achieved     PT SHORT TERM GOAL #2   Title decrease R shoulder pain to 5/10.   Status Achieved           PT Long Term Goals - 12/24/16 0817      PT LONG TERM GOAL #1   Title I with HEP   Time 8   Period Weeks   Status Achieved     PT LONG TERM GOAL #2   Title Improved R shoulder ROM to [facility]   Time 8   Period Weeks   Status On-going     PT LONG TERM GOAL #3   Title Decreased pain in R shoulder to 2-3/10 with functional activities    Time 8   Period Weeks   Status --  5/10 with ADL's 12/24/16     PT LONG TERM GOAL #4   Title R shoulder strength 4+/5 or better to improve function (Not to be addressed until okayed by protocol/MD)   Time 8   Period Weeks   Status On-going               Plan - 12/31/16 1130    Clinical Impression Statement Patient tolerated treatment well today. Patient has reported ongoing soreness in right shoulder esp with activity and has reported difficulty with reaching into cabinet. Patient required verbal and visual cues for 4 way shoulder exercise for corrent technique. Patient improved ROM for shoulder flexion today. Patient current goals ongoing due to ROM and strength deficts.   Rehab Potential Excellent   Clinical Impairments Affecting Rehab Potential 06/14/16 surgury/ current 27 week 12/20/16   PT Frequency 3x / week   PT Duration 2 weeks   PT Treatment/Interventions ADLs/Self Care Home Management;Electrical Stimulation;Patient/family education;Neuromuscular re-education;Therapeutic exercise;Manual techniques;Passive range of motion;Vasopneumatic Device   PT Next Visit Plan cont with POC per protocol given by MD (see binder) MD.    Consulted and Agree with Plan of Care Patient      Patient will benefit from skilled therapeutic intervention in order to improve the following deficits and impairments:  Decreased range of motion, Impaired UE functional use, Pain, Decreased strength  Visit Diagnosis: Chronic right shoulder pain  Stiffness of right shoulder, not elsewhere classified     Problem List Patient Active Problem List   Diagnosis Date Noted  . 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 Abdominal pain, acute, bilateral lower quadrant 07/01/2014  . Transaminitis 07/01/2014  . COPD (chronic obstructive pulmonary disease) ([place])   . Asthma   . Interstitial cystitis   . Unspecified vitamin D deficiency 05/29/2013  . Lumbar radiculopathy 05/29/2013  . Back strain  05/29/2013  . Unspecified asthma(493.90) 02/27/2013  . Depression with anxiety 02/22/2011  . Uncontrolled type 2 diabetes mellitus with insulin therapy ([place]) 02/22/2011  . Hyperlipemia 02/22/2011  . HTN (hypertension) 02/22/2011    Jasara Corrigan P, PTA 12/31/2016, 12:08 PM  [facility] [place], [place], [address] Phone: [phone]   Fax:  [phone]  Name: Diana Pratt MRN: [id number] Date of Birth: 1946/01/25

## 2017-01-03 ENCOUNTER — Encounter: Admitting: Physical Therapy

## 2017-01-03 ENCOUNTER — Ambulatory Visit (INDEPENDENT_AMBULATORY_CARE_PROVIDER_SITE_OTHER): Payer: Medicare Other | Admitting: Pharmacist

## 2017-01-03 VITALS — BP 132/70 | HR 74 | Ht 64.0 in | Wt 211.0 lb

## 2017-01-03 DIAGNOSIS — Z794 Long term (current) use of insulin: Secondary | ICD-10-CM | POA: Diagnosis not present

## 2017-01-03 DIAGNOSIS — R300 Dysuria: Secondary | ICD-10-CM

## 2017-01-03 DIAGNOSIS — IMO0002 Reserved for concepts with insufficient information to code with codable children: Secondary | ICD-10-CM

## 2017-01-03 DIAGNOSIS — E1165 Type 2 diabetes mellitus with hyperglycemia: Secondary | ICD-10-CM

## 2017-01-03 LAB — URINALYSIS, COMPLETE
BILIRUBIN UA: NEGATIVE
Glucose, UA: NEGATIVE
KETONES UA: NEGATIVE
Leukocytes, UA: NEGATIVE
NITRITE UA: NEGATIVE
PH UA: 5 (ref 5.0–7.5)
Protein, UA: NEGATIVE
RBC UA: NEGATIVE
Specific Gravity, UA: 1.02 (ref 1.005–1.030)
Urobilinogen, Ur: 0.2 mg/dL (ref 0.2–1.0)

## 2017-01-03 LAB — MICROSCOPIC EXAMINATION
RBC, UA: NONE SEEN /hpf (ref 0–?)
RENAL EPITHEL UA: NONE SEEN /HPF

## 2017-01-03 NOTE — Patient Instructions (Signed)
Continuous Glucose monitor was placed today to try to get better understanding of blood glucose trends. This monitor  was placed on the back of your upper left arm.  You will wear at least 5 days and up to 14 days.    Keep records of your blood glucose readings, what you have eaten at meal and how much insulin you are giving each day.    You can engage in regular activities with special care when showering, changing clothes and swimming (only up to 3 feet and for 30 minutes at a time)

## 2017-01-03 NOTE — Progress Notes (Signed)
Patient ID: Diana Pratt, female   DOB: 11/12/1945, 71 y.o.   MRN: [id number]   Subjective:    Diana Pratt is a 71 y.o. female who presents for re-evaluation of Type 2 diabetes mellitus, uncontrolled and requiring insulin therapy. The patient was initially diagnosed with Type 2 diabetes mellitus in the 1990's. She was last seen about 4 weeks ago.  At that visit Lantus was increased.  Mrs. Paper also mentions that she is still having discomfort from time to time when urinating.  She was treat with cipro 500mg  bid for 5 days in January 2018 for UTI  She is also currently taking Lantus 60 units daily, Humalog 10 units tid and Januvia 100mg  once a day   Current monitoring regimen: home blood tests - qod. Patient states she has been having problems with glucometer over the last 1 week. HBG reading: 90 day avg = 227 (#39 readings) 30 day avg = 201 (#12 readings) 14 day avg = 241 (#4 readings) 7 day avg = 241 (#3 readings)  Home blood sugar records: range from 113 to 331 Any episodes of hypoglycemia? No  RBG in office today was 47  Known diabetic complications: peripheral neuropathy Cardiovascular risk factors: advanced age (older than 44 for men, 52 for women), diabetes mellitus, dyslipidemia, hypertension, obesity (BMI >= 30 kg/m2) and sedentary lifestyle  Weight trend: stable Eye exam - done recently (11/2016) with Dr Rona Ravens Prior visit with CDE - yes, last visit 05/2016 Current diet: diet has improved greatly over the last 2 weeks - patient is counting CHO's and has reduced her CHO intake. Current exercise: none  - currently doing PT for shoulder (had total shoulder replacement - right)   Is She on ACE inhibitor or angiotensin II receptor blocker?  Yes    valsartan + amlodipine  The following portions of the patient's history were reviewed and updated as appropriate: allergies, current medications, past family history, past medical history, past social history, past surgical  history and problem list.    Objective:    BP 132/70   Pulse 74   Ht 5\' 4"  (1.626 m)   Wt 211 lb (95.7 kg)   BMI 36.22 kg/m   A1c = 10.7% (10/19/2016) A1c = 9.7% (05/17/217)   Lipid Panel     Component Value Date/Time   CHOL 246 (H) 12/05/2016 0957   CHOL 243 (H) 05/29/2013 1037   TRIG 276 (H) 12/05/2016 0957   TRIG 171 (H) 12/25/2013 0855   TRIG 304 (H) 05/29/2013 1037   HDL 33 (L) 12/05/2016 0957   HDL 38 (L) 12/25/2013 0855   HDL 30 (L) 05/29/2013 1037   CHOLHDL 7.5 (H) 12/05/2016 0957   LDLCALC 158 (H) 12/05/2016 0957   LDLCALC 192 (H) 12/25/2013 0855   LDLCALC 152 (H) 05/29/2013 1037   LDLDIRECT 170 (H) 12/05/2016 0957     Lab Review Glucose (mg/dL)  Date Value  12/05/2016 180 (H)  03/21/2016 85  12/09/2014 158 (H)   Glucose, Bld (mg/dL)  Date Value  06/06/2016 151 (H)  05/31/2014 186 (H)  09/06/2013 104 (H)   CO2 (mmol/L)  Date Value  12/05/2016 23  06/06/2016 23  03/21/2016 24   BUN (mg/dL)  Date Value  12/05/2016 14  06/06/2016 11  03/21/2016 11  12/09/2014 15   Creat (mg/dL)  Date Value  05/29/2013 0.84  02/27/2013 0.89   Creatinine, Ser (mg/dL)  Date Value  12/05/2016 0.75  06/06/2016 0.75  03/21/2016 0.79   Assessment:  Diabetes Mellitus type II, inadequately controlled with variable BG.  HTN - controlled. dysuria  Plan:    1.  Rx changes:  Continue Januvia 100mg  qd  Continue Lantus to 60 units once daily  Continue Humalog 10 units tid 2.  Education: Reviewed 'ABCs' of diabetes management (respective goals in parentheses):  A1C (<7), blood pressure (<130/80), and cholesterol (LDL <100). 3.  Reviewed CHO counting diet and serving size recommendations. Patient was given ADA low CHO type diet to follow. Handout contains samples for low CHO breakfast, lunch, dinner and snack ideas as well as recipes.   4.  Continuous Glucose monitor was placed today to try to get better understanding of BG trends.  Patient educated about  proper care of CGM and site.  CGM was placed on back of upper left arm.  Patient will wear at least 5 days and up to 14 days.  He is to keep BG, diet and insulin administration records. He can engage in regular activities with special care when showering, changing clothes and swimming (only up to 3 feet and for 30 minutes at a time) 5.  RTC in 2 weeks to download CGM results and make insulin adjustments as needed   Orders Placed This Encounter  Procedures  . Microscopic Examination  . Urinalysis, Complete

## 2017-01-04 ENCOUNTER — Encounter: Payer: Self-pay | Admitting: Pharmacist

## 2017-01-04 DIAGNOSIS — R300 Dysuria: Secondary | ICD-10-CM | POA: Diagnosis not present

## 2017-01-04 NOTE — Addendum Note (Signed)
Addended by: Pollyann Kennedy F on: 01/04/2017 11:51 AM   Modules accepted: Orders

## 2017-01-06 LAB — URINE CULTURE

## 2017-01-07 ENCOUNTER — Encounter: Payer: Self-pay | Admitting: Physical Therapy

## 2017-01-07 ENCOUNTER — Ambulatory Visit: Payer: Medicare Other | Attending: Orthopedic Surgery | Admitting: Physical Therapy

## 2017-01-07 DIAGNOSIS — M25511 Pain in right shoulder: Secondary | ICD-10-CM | POA: Insufficient documentation

## 2017-01-07 DIAGNOSIS — M25611 Stiffness of right shoulder, not elsewhere classified: Secondary | ICD-10-CM | POA: Diagnosis not present

## 2017-01-07 DIAGNOSIS — G8929 Other chronic pain: Secondary | ICD-10-CM | POA: Diagnosis not present

## 2017-01-07 NOTE — Therapy (Signed)
[facility] [place], [place], [address] Phone: [phone]   Fax:  [phone]  Physical Therapy Treatment  Patient Details  Name: Diana Pratt MRN: [id number] Date of Birth: 1946/10/14 Referring Provider: Justice Britain  Encounter Date: 01/07/2017      PT End of Session - 01/07/17 1137    Visit Number 43   Number of Visits 48   Date for PT Re-Evaluation 01/16/17   PT Start Time 1119   PT Stop Time 1201   PT Time Calculation (min) 42 min   Activity Tolerance Patient tolerated treatment well   Behavior During Therapy [facility] for tasks assessed/performed      Past Medical History:  Diagnosis Date  . Anxiety   . Asthma   . Back pain   . 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 COPD (chronic obstructive pulmonary disease) ([place])    dr. Kenn File  . Depression   . Diabetes mellitus   . Diabetic neuropathy ([place])   . GERD (gastroesophageal reflux disease)    occ heartburn  . Humerus fracture    right  . Hyperlipemia   . Hypertension   . Interstitial cystitis   . Osteoporosis   . Pneumonia    15 yrs ago  . PONV (postoperative nausea and vomiting)   . Shortness of breath dyspnea     Past Surgical History:  Procedure Laterality Date  . ABDOMINAL HYSTERECTOMY  03/1984   partial  . COLONOSCOPY N/A 07/19/2014   Procedure: COLONOSCOPY;  Surgeon: Danie Binder, MD;  [facility];  Service: Endoscopy;  Laterality: N/A;  9:30  . REVERSE SHOULDER ARTHROPLASTY Right 06/14/2016   Procedure: REVERSE SHOULDER ARTHROPLASTY;  Surgeon: Justice Britain, MD;  Location: [place];  Service: Orthopedics;  Laterality: Right;    There were no vitals filed for this visit.      Subjective Assessment - 01/07/17 1120    Subjective Patient arrived with ongoing soreness and short winded today   Pertinent History DM, OP, COPD   Limitations Lifting   Patient Stated Goals get over this pain and get  back to using this arm   Currently in Pain? Yes   Pain Score 3    Pain Location Shoulder   Pain Orientation Right   Pain Descriptors / Indicators Sore   Pain Type Surgical pain   Pain Onset More than a month ago   Pain Frequency Intermittent   Aggravating Factors  increased activity   Pain Relieving Factors at rest            [facility] PT Assessment - 01/07/17 0001      AROM   AROM Assessment Site Shoulder   Right/Left Shoulder Right   Right Shoulder External Rotation 60 Degrees     PROM   PROM Assessment Site Shoulder   Right/Left Shoulder Right   Right Shoulder Flexion 141 Degrees   Right Shoulder External Rotation 65 Degrees                     [facility] Adult PT Treatment/Exercise - 01/07/17 0001      Shoulder Exercises: Supine   Flexion AROM;Right;20 reps;10 reps   Shoulder Flexion Weight (lbs) 1   Other Supine Exercises supine for circles with shoulder at 90@ x18min   Other Supine Exercises supine ceiling punches 1# 2x10     Shoulder Exercises: Sidelying   External Rotation Strengthening;Right;Weights  2x10  External Rotation Weight (lbs) 1     Shoulder Exercises: Standing   Other Standing Exercises RW4 with yellow t-band x20 each     Moist Heat Therapy   Number Minutes Moist Heat 15 Minutes   Moist Heat Location Shoulder     Electrical Stimulation   Electrical Stimulation Location R shoulder Premod  80-150hz  x 15 mins   Electrical Stimulation Goals Pain;Tone     Manual Therapy   Manual Therapy Passive ROM   Passive ROM PROM for right shoulder flexion and ER with gentle holds                  PT Short Term Goals - 10/15/16 1154      PT SHORT TERM GOAL #1   Title Improve R shoulder ER to 20 degrees   Status Achieved     PT SHORT TERM GOAL #2   Title decrease R shoulder pain to 5/10.   Status Achieved           PT Long Term Goals - 12/24/16 0817      PT LONG TERM GOAL #1   Title I with HEP   Time 8   Period Weeks    Status Achieved     PT LONG TERM GOAL #2   Title Improved R shoulder ROM to [facility]   Time 8   Period Weeks   Status On-going     PT LONG TERM GOAL #3   Title Decreased pain in R shoulder to 2-3/10 with functional activities   Time 8   Period Weeks   Status --  5/10 with ADL's 12/24/16     PT LONG TERM GOAL #4   Title R shoulder strength 4+/5 or better to improve function (Not to be addressed until okayed by protocol/MD)   Time 8   Period Weeks   Status On-going               Plan - 01/07/17 1151    Clinical Impression Statement Patient progressing today with PRE progression. Patient improved with ROM for right shoulder flexion today. Patient continues to have reported ongoing 3/10 pain that comes and goes with activity. Patient tolerated treatment well today. Goals progresing yet ongoing due to pain, ROM and strength deficts.    Rehab Potential Excellent   Clinical Impairments Affecting Rehab Potential 06/14/16 surgury/ current 29 week 01/03/17   PT Frequency 3x / week   PT Duration 2 weeks   PT Treatment/Interventions ADLs/Self Care Home Management;Electrical Stimulation;Patient/family education;Neuromuscular re-education;Therapeutic exercise;Manual techniques;Passive range of motion;Vasopneumatic Device   PT Next Visit Plan cont with POC per protocol given by MD (see binder) MD.    Consulted and Agree with Plan of Care Patient      Patient will benefit from skilled therapeutic intervention in order to improve the following deficits and impairments:  Decreased range of motion, Impaired UE functional use, Pain, Decreased strength  Visit Diagnosis: Chronic right shoulder pain  Stiffness of right shoulder, not elsewhere classified  Right shoulder pain, unspecified chronicity     Problem List Patient Active Problem List   Diagnosis Date Noted  . 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 Abdominal pain, acute, bilateral lower quadrant 07/01/2014  .  Transaminitis 07/01/2014  . COPD (chronic obstructive pulmonary disease) ([place])   . Asthma   . Interstitial cystitis   . Unspecified vitamin D deficiency 05/29/2013  . Lumbar radiculopathy 05/29/2013  . Back strain 05/29/2013  . Unspecified asthma(493.90) 02/27/2013  .  Depression with anxiety 02/22/2011  . Uncontrolled type 2 diabetes mellitus with insulin therapy ([place]) 02/22/2011  . Hyperlipemia 02/22/2011  . HTN (hypertension) 02/22/2011    Jordynn Perrier P, PTA 01/07/2017, 12:04 PM  [facility] [address] [place], [place], [address] Phone: [phone]   Fax:  [phone]  Name: Diana Pratt MRN: [id number] Date of Birth: 02/20/1946

## 2017-01-10 ENCOUNTER — Ambulatory Visit: Payer: Medicare Other | Admitting: Physical Therapy

## 2017-01-10 ENCOUNTER — Encounter: Payer: Self-pay | Admitting: Physical Therapy

## 2017-01-10 DIAGNOSIS — G8929 Other chronic pain: Secondary | ICD-10-CM

## 2017-01-10 DIAGNOSIS — M25511 Pain in right shoulder: Secondary | ICD-10-CM

## 2017-01-10 DIAGNOSIS — M25611 Stiffness of right shoulder, not elsewhere classified: Secondary | ICD-10-CM

## 2017-01-10 NOTE — Therapy (Signed)
[facility] [place], [place], [address] Phone: [phone]   Fax:  [phone]  Physical Therapy Treatment  Patient Details  Name: Diana Pratt MRN: [id number] Date of Birth: [birth year] Referring Provider: Justice Britain  Encounter Date: 01/10/2017      PT End of Session - 01/10/17 1148    Visit Number 44   Number of Visits 48   Date for PT Re-Evaluation 01/16/17   PT Start Time 1116   PT Stop Time 1200   PT Time Calculation (min) 44 min   Activity Tolerance Patient tolerated treatment well   Behavior During Therapy [facility] for tasks assessed/performed      Past Medical History:  Diagnosis Date  . Anxiety   . Asthma   . Back pain   . 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 COPD (chronic obstructive pulmonary disease) ([place])    dr. Kenn File  . Depression   . Diabetes mellitus   . Diabetic neuropathy ([place])   . GERD (gastroesophageal reflux disease)    occ heartburn  . Humerus fracture    right  . Hyperlipemia   . Hypertension   . Interstitial cystitis   . Osteoporosis   . Pneumonia    15 yrs ago  . PONV (postoperative nausea and vomiting)   . Shortness of breath dyspnea     Past Surgical History:  Procedure Laterality Date  . ABDOMINAL HYSTERECTOMY  03/1984   partial  . COLONOSCOPY N/A 07/19/2014   Procedure: COLONOSCOPY;  Surgeon: Danie Binder, MD;  [facility];  Service: Endoscopy;  Laterality: N/A;  9:30  . REVERSE SHOULDER ARTHROPLASTY Right 06/14/2016   Procedure: REVERSE SHOULDER ARTHROPLASTY;  Surgeon: Justice Britain, MD;  Location: [place];  Service: Orthopedics;  Laterality: Right;    There were no vitals filed for this visit.      Subjective Assessment - 01/10/17 1122    Subjective Patient feeling some better today   Pertinent History DM, OP, COPD   Limitations Lifting   Patient Stated Goals get over this pain and get back to using this arm   Currently in Pain? Yes   Pain Score 2    Pain Location Shoulder   Pain Orientation Right   Pain Descriptors / Indicators Sore   Pain Type Surgical pain   Pain Onset More than a month ago   Pain Frequency Intermittent   Aggravating Factors  increased activity   Pain Relieving Factors at rest                         [facility] Adult PT Treatment/Exercise - 01/10/17 0001      Shoulder Exercises: Supine   Flexion AROM;Right;20 reps   Shoulder Flexion Weight (lbs) 1   Other Supine Exercises supine ceiling punches 1# 2x10     Shoulder Exercises: Standing   Protraction Strengthening;Right;20 reps;Theraband   Theraband Level (Shoulder Protraction) Level 1 (Yellow)   External Rotation Strengthening;Right;20 reps;Theraband   Theraband Level (Shoulder External Rotation) Level 1 (Yellow)   Internal Rotation Strengthening;Right;20 reps;Theraband   Theraband Level (Shoulder Internal Rotation) Level 1 (Yellow)   Row Strengthening;Right;20 reps;Theraband   Theraband Level (Shoulder Row) Level 1 (Yellow)     Shoulder Exercises: Pulleys   Other Pulley Exercises UE ranger for elevation and circles 3x10   Other Pulley Exercises wall slides x39min with cues for eccentric lowering  Moist Heat Therapy   Number Minutes Moist Heat 15 Minutes   Moist Heat Location Shoulder     Electrical Stimulation   Electrical Stimulation Location R shoulder Premod  80-150hz  x 15 mins   Electrical Stimulation Goals Pain;Tone     Manual Therapy   Manual Therapy Passive ROM   Passive ROM PROM for right shoulder flexion and ER with gentle holds                  PT Short Term Goals - 10/15/16 1154      PT SHORT TERM GOAL #1   Title Improve R shoulder ER to 20 degrees   Status Achieved     PT SHORT TERM GOAL #2   Title decrease R shoulder pain to 5/10.   Status Achieved           PT Long Term Goals - 01/10/17 1150      PT LONG TERM GOAL #1   Title I with HEP   Time 8    Period Weeks   Status Achieved     PT LONG TERM GOAL #2   Title Improved R shoulder ROM to [facility]   Time 8   Period Weeks   Status On-going     PT LONG TERM GOAL #3   Title Decreased pain in R shoulder to 2-3/10 with functional activities   Time 8   Period Weeks   Status Achieved  01/10/17     PT LONG TERM GOAL #4   Title R shoulder strength 4+/5 or better to improve function (Not to be addressed until okayed by protocol/MD)   Time 8   Period Weeks   Status On-going               Plan - 01/10/17 1151    Clinical Impression Statement Patient tolerated treatment well today. Patient has reported less pain overall and able to perform ADL's with less discomfort. Patient able to progress PRE with cues required for proper technique. Patient ble to meet LTG #3 other ongoing due to strength deficts.   Rehab Potential Excellent   Clinical Impairments Affecting Rehab Potential 06/14/16 surgury/ current 30 week 01/10/17   PT Frequency 3x / week   PT Duration 2 weeks   PT Treatment/Interventions ADLs/Self Care Home Management;Electrical Stimulation;Patient/family education;Neuromuscular re-education;Therapeutic exercise;Manual techniques;Passive range of motion;Vasopneumatic Device   PT Next Visit Plan cont with POC per protocol given by MD (see binder) MD.    Consulted and Agree with Plan of Care Patient      Patient will benefit from skilled therapeutic intervention in order to improve the following deficits and impairments:  Decreased range of motion, Impaired UE functional use, Pain, Decreased strength  Visit Diagnosis: Chronic right shoulder pain  Stiffness of right shoulder, not elsewhere classified  Right shoulder pain, unspecified chronicity     Problem List Patient Active Problem List   Diagnosis Date Noted  . 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 Abdominal pain, acute, bilateral lower quadrant 07/01/2014  . Transaminitis 07/01/2014  . COPD (chronic  obstructive pulmonary disease) ([place])   . Asthma   . Interstitial cystitis   . Unspecified vitamin D deficiency 05/29/2013  . Lumbar radiculopathy 05/29/2013  . Back strain 05/29/2013  . Unspecified asthma(493.90) 02/27/2013  . Depression with anxiety 02/22/2011  . Uncontrolled type 2 diabetes mellitus with insulin therapy ([place]) 02/22/2011  . Hyperlipemia 02/22/2011  . HTN (hypertension) 02/22/2011    Shanette Tamargo P, PTA 01/10/2017, 12:03  PM  [facility] [place], [place], [address] Phone: [phone]   Fax:  [phone]  Name: Diana Pratt MRN: [id number] Date of Birth: [birth year]

## 2017-01-14 ENCOUNTER — Encounter: Admitting: Physical Therapy

## 2017-01-17 ENCOUNTER — Encounter: Payer: Self-pay | Admitting: *Deleted

## 2017-01-17 ENCOUNTER — Ambulatory Visit: Payer: Medicare Other | Admitting: Physical Therapy

## 2017-01-17 ENCOUNTER — Ambulatory Visit (INDEPENDENT_AMBULATORY_CARE_PROVIDER_SITE_OTHER): Payer: Medicare Other | Admitting: Pharmacist

## 2017-01-17 ENCOUNTER — Encounter: Payer: Self-pay | Admitting: Physical Therapy

## 2017-01-17 DIAGNOSIS — M25611 Stiffness of right shoulder, not elsewhere classified: Secondary | ICD-10-CM

## 2017-01-17 DIAGNOSIS — M25511 Pain in right shoulder: Principal | ICD-10-CM

## 2017-01-17 DIAGNOSIS — Z794 Long term (current) use of insulin: Secondary | ICD-10-CM | POA: Diagnosis not present

## 2017-01-17 DIAGNOSIS — G8929 Other chronic pain: Secondary | ICD-10-CM

## 2017-01-17 DIAGNOSIS — E1165 Type 2 diabetes mellitus with hyperglycemia: Secondary | ICD-10-CM | POA: Diagnosis not present

## 2017-01-17 DIAGNOSIS — IMO0002 Reserved for concepts with insufficient information to code with codable children: Secondary | ICD-10-CM

## 2017-01-17 MED ORDER — INSULIN GLARGINE 100 UNIT/ML SOLOSTAR PEN: 65.0000 [IU] | PEN_INJECTOR | Freq: Every day | SUBCUTANEOUS | 1 refills | Status: DC

## 2017-01-17 MED ORDER — INSULIN LISPRO 100 UNIT/ML (KWIKPEN): 8.0000 [IU] | PEN_INJECTOR | Freq: Three times a day (TID) | SUBCUTANEOUS | 1 refills | Status: DC

## 2017-01-17 NOTE — Therapy (Signed)
[facility] [place], [place], [address] Phone: [phone]   Fax:  [phone]  Physical Therapy Treatment  Patient Details  Name: Diana Pratt MRN: [id number] Date of Birth: 01/14/1946 Referring Provider: Justice Britain  Encounter Date: 01/17/2017      PT End of Session - 01/17/17 1121    Visit Number 45   Number of Visits 48   Date for PT Re-Evaluation 01/16/17   PT Start Time 1116   PT Stop Time 1200   PT Time Calculation (min) 44 min   Activity Tolerance Patient tolerated treatment well   Behavior During Therapy [facility] for tasks assessed/performed      Past Medical History:  Diagnosis Date  . Anxiety   . Asthma   . Back pain   . 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 COPD (chronic obstructive pulmonary disease) ([place])    dr. Kenn File  . Depression   . Diabetes mellitus   . Diabetic neuropathy ([place])   . GERD (gastroesophageal reflux disease)    occ heartburn  . Humerus fracture    right  . Hyperlipemia   . Hypertension   . Interstitial cystitis   . Osteoporosis   . Pneumonia    15 yrs ago  . PONV (postoperative nausea and vomiting)   . Shortness of breath dyspnea     Past Surgical History:  Procedure Laterality Date  . ABDOMINAL HYSTERECTOMY  03/1984   partial  . COLONOSCOPY N/A 07/19/2014   Procedure: COLONOSCOPY;  Surgeon: Danie Binder, MD;  [facility];  Service: Endoscopy;  Laterality: N/A;  9:30  . REVERSE SHOULDER ARTHROPLASTY Right 06/14/2016   Procedure: REVERSE SHOULDER ARTHROPLASTY;  Surgeon: Justice Britain, MD;  Location: [place];  Service: Orthopedics;  Laterality: Right;    There were no vitals filed for this visit.      Subjective Assessment - 01/17/17 1118    Subjective Patient reported some soreness due to weather today   Pertinent History DM, OP, COPD   Limitations Lifting   Patient Stated Goals get over this pain and get back to  using this arm   Currently in Pain? Yes   Pain Score 2    Pain Location Shoulder   Pain Orientation Right   Pain Descriptors / Indicators Sore   Pain Type Surgical pain   Pain Onset More than a month ago   Pain Frequency Intermittent   Aggravating Factors  weather and increased overhead reaching   Pain Relieving Factors at rest                         [facility] Adult PT Treatment/Exercise - 01/17/17 0001      Shoulder Exercises: Supine   Flexion AROM;Right;20 reps   Shoulder Flexion Weight (lbs) 1     Shoulder Exercises: Prone   Other Prone Exercises kneeling for rows and ext 2# x20 each     Shoulder Exercises: Standing   Protraction Strengthening;Right;Theraband  3x10   Theraband Level (Shoulder Protraction) Level 1 (Yellow)   External Rotation Strengthening;Right;Theraband  3x10   Theraband Level (Shoulder External Rotation) Level 1 (Yellow)   Internal Rotation Strengthening;Right;Theraband  3x10   Theraband Level (Shoulder Internal Rotation) Level 1 (Yellow)   Row Strengthening;Right;Theraband  3x10   Theraband Level (Shoulder Row) Level 1 (Yellow)     Shoulder Exercises: Pulleys   Flexion Other (comment)  67min  Other Pulley Exercises UE ranger for elevation and circles 3x10   Other Pulley Exercises wall slides x2min with cues for eccentric lowering     Moist Heat Therapy   Number Minutes Moist Heat 15 Minutes   Moist Heat Location Shoulder     Electrical Stimulation   Electrical Stimulation Location R shoulder Premod  80-150hz  x 15 mins   Electrical Stimulation Goals Pain;Tone                  PT Short Term Goals - 10/15/16 1154      PT SHORT TERM GOAL #1   Title Improve R shoulder ER to 20 degrees   Status Achieved     PT SHORT TERM GOAL #2   Title decrease R shoulder pain to 5/10.   Status Achieved           PT Long Term Goals - 01/10/17 1150      PT LONG TERM GOAL #1   Title I with HEP   Time 8   Period Weeks    Status Achieved     PT LONG TERM GOAL #2   Title Improved R shoulder ROM to [facility]   Time 8   Period Weeks   Status On-going     PT LONG TERM GOAL #3   Title Decreased pain in R shoulder to 2-3/10 with functional activities   Time 8   Period Weeks   Status Achieved  01/10/17     PT LONG TERM GOAL #4   Title R shoulder strength 4+/5 or better to improve function (Not to be addressed until okayed by protocol/MD)   Time 8   Period Weeks   Status On-going               Plan - 01/17/17 1141    Clinical Impression Statement Patient tolerated treatment well and progressing with PRE's. Patient has reported ongoing soreness yet improved overall. Patient has some limitations with overhead reaching yet able to notice improvement. Patient progressing with ROM WNL yet ongoing due to strength deficts.    Rehab Potential Excellent   Clinical Impairments Affecting Rehab Potential 06/14/16 surgury/ current 31 week 01/17/17   PT Frequency 3x / week   PT Duration 2 weeks   PT Treatment/Interventions ADLs/Self Care Home Management;Electrical Stimulation;Patient/family education;Neuromuscular re-education;Therapeutic exercise;Manual techniques;Passive range of motion;Vasopneumatic Device   PT Next Visit Plan cont with POC per protocol given by MD (see binder) MD.    Consulted and Agree with Plan of Care Patient      Patient will benefit from skilled therapeutic intervention in order to improve the following deficits and impairments:  Decreased range of motion, Impaired UE functional use, Pain, Decreased strength  Visit Diagnosis: Chronic right shoulder pain  Stiffness of right shoulder, not elsewhere classified     Problem List Patient Active Problem List   Diagnosis Date Noted  . 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 Abdominal pain, acute, bilateral lower quadrant 07/01/2014  . Transaminitis 07/01/2014  . COPD (chronic obstructive pulmonary disease) ([place])   . Asthma    . Interstitial cystitis   . Unspecified vitamin D deficiency 05/29/2013  . Lumbar radiculopathy 05/29/2013  . Back strain 05/29/2013  . Unspecified asthma(493.90) 02/27/2013  . Depression with anxiety 02/22/2011  . Uncontrolled type 2 diabetes mellitus with insulin therapy ([place]) 02/22/2011  . Hyperlipemia 02/22/2011  . HTN (hypertension) 02/22/2011    Amyr Sluder P, PTA 01/17/2017, 12:01 PM  [facility]  [place], [place], [address] Phone: [phone]   Fax:  [phone]  Name: IOWA KAPPES MRN: [id number] Date of Birth: 04-04-1946

## 2017-01-17 NOTE — Patient Instructions (Signed)
Per report from continueous glucose monitor your estimated A1c was 8.5% (decreased from 10.7%)  Also average blood glucose improving from 260 to 196.  Increase Lantus to 65 units once a day  Change Humalog to 8 units with breakfast 12 units with lunch 8 units with evening meal  Continue Januvia   Diabetes and Standards of Medical Care   Diabetes is complicated. You may find that your diabetes team includes a dietitian, nurse, diabetes educator, eye doctor, and more. To help everyone know what is going on and to help you get the care you deserve, the following schedule of care was developed to help keep you on track. Below are the tests, exams, vaccines, medicines, education, and plans you will need.  Blood Glucose Goals Prior to meals = 80 - 130 Within 2 hours of the start of a meal = less than 180  HbA1c test (goal is less than 7.0% - your last value was %) This test shows how well you have controlled your glucose over the past 2 to 3 months. It is used to see if your diabetes management plan needs to be adjusted.   It is performed at least 2 times a year if you are meeting treatment goals.  It is performed 4 times a year if therapy has changed or if you are not meeting treatment goals.  Blood pressure test  This test is performed at every routine medical visit. The goal is less than 140/90 mmHg for most people, but 130/80 mmHg in some cases. Ask your health care provider about your goal.  Dental exam  Follow up with the dentist regularly.  Eye exam  If you are diagnosed with type 1 diabetes as a child, get an exam upon reaching the age of 84 years or older and have had diabetes for 3 to 5 years. Yearly eye exams are recommended after that initial eye exam.  If you are diagnosed with type 1 diabetes as an adult, get an exam within 5 years of diagnosis and then yearly.  If you are diagnosed with type 2 diabetes, get an exam as soon as possible after the diagnosis and then  yearly.  Foot care exam  Visual foot exams are performed at every routine medical visit. The exams check for cuts, injuries, or other problems with the feet.  A comprehensive foot exam should be done yearly. This includes visual inspection as well as assessing foot pulses and testing for loss of sensation.  Check your feet nightly for cuts, injuries, or other problems with your feet. Tell your health care provider if anything is not healing.  Kidney function test (urine microalbumin)  This test is performed once a year.  Type 1 diabetes: The first test is performed 5 years after diagnosis.  Type 2 diabetes: The first test is performed at the time of diagnosis.  A serum creatinine and estimated glomerular filtration rate (eGFR) test is done once a year to assess the level of chronic kidney disease (CKD), if present.  Lipid profile (cholesterol, HDL, LDL, triglycerides)  Performed every 5 years for most people.  The goal for LDL is less than 100 mg/dL. If you are at high risk, the goal is less than 70 mg/dL.  The goal for HDL is 40 mg/dL to 50 mg/dL for men and 50 mg/dL to 60 mg/dL for women. An HDL cholesterol of 60 mg/dL or higher gives some protection against heart disease.  The goal for triglycerides is less than 150 mg/dL.  Influenza vaccine, pneumococcal vaccine, and hepatitis B vaccine  The influenza vaccine is recommended yearly.  The pneumococcal vaccine is generally given once in a lifetime. However, there are some instances when another vaccination is recommended. Check with your health care provider.  The hepatitis B vaccine is also recommended for adults with diabetes.  Diabetes self-management education  Education is recommended at diagnosis and ongoing as needed.  Treatment plan  Your treatment plan is reviewed at every medical visit.  Document Released: 08/19/2009 Document Revised: 06/24/2013 Document Reviewed: 03/24/2013 [facility] Patient Information  2014 [place].

## 2017-01-17 NOTE — Progress Notes (Signed)
Patient ID: Diana Pratt, female   DOB: 1945-12-29, 71 y.o.   MRN: [id number]   HPI:   Diana Pratt is a 71 y.o. female who presents for re-evaluation of Type 2 diabetes mellitus, uncontrolled and requiring insulin therapy. The patient was initially diagnosed with Type 2 diabetes mellitus in the 1990's. She was last seen about 2 weeks ago when continueous glucose monitor was place.  She is here today to removed CGM, download results and make adjustments in insulin as needed.  She is currently taking Lantus 60 units daily, Humalog 10 units tid and Januvia 100mg  once a day   CMG report revealed the following trends: Average BG = 196 Estimated A1c = 8.5% (patient's last A1c in office was 10.7% 10/19/16)  83% of readings over the 14 days that she wore CGM were above target range.  1% below target range or hypoglycemia - patient had  Hypoglycemic events.  17 of readings were in range Areas of highest BG average is in early am before rising from 2am to 6am and the afternoon after lunch from noon to 6pm  Diet - patient eats 3 meals per day and occasionally a snack in the late evening.  Her largest meal is usually lunch.   Breakfast is usually   Known diabetic complications: peripheral neuropathy Cardiovascular risk factors: advanced age (older than 63 for men, 6 for women), diabetes mellitus, dyslipidemia, hypertension, obesity (BMI >= 30 kg/m2) and sedentary lifestyle  Eye exam - done 11/2016 with Dr Rona Ravens Prior visit with CDE - yes Current exercise: none  - currently doing PT for shoulder (had total shoulder replacement - right)   Is She on ACE inhibitor or angiotensin II receptor blocker?  Yes    valsartan + amlodipine   Objective:    There were no vitals taken for this visit.  A1c = 10.7% (10/19/2016) A1c = 9.7% (05/17/217)   Lipid Panel     Component Value Date/Time   CHOL 246 (H) 12/05/2016 0957   CHOL 243 (H) 05/29/2013 1037   TRIG 276 (H) 12/05/2016 0957   TRIG 171  (H) 12/25/2013 0855   TRIG 304 (H) 05/29/2013 1037   HDL 33 (L) 12/05/2016 0957   HDL 38 (L) 12/25/2013 0855   HDL 30 (L) 05/29/2013 1037   CHOLHDL 7.5 (H) 12/05/2016 0957   LDLCALC 158 (H) 12/05/2016 0957   LDLCALC 192 (H) 12/25/2013 0855   LDLCALC 152 (H) 05/29/2013 1037   LDLDIRECT 170 (H) 12/05/2016 0957     Lab Review Glucose (mg/dL)  Date Value  12/05/2016 180 (H)  03/21/2016 85  12/09/2014 158 (H)   Glucose, Bld (mg/dL)  Date Value  06/06/2016 151 (H)  05/31/2014 186 (H)  09/06/2013 104 (H)   CO2 (mmol/L)  Date Value  12/05/2016 23  06/06/2016 23  03/21/2016 24   BUN (mg/dL)  Date Value  12/05/2016 14  06/06/2016 11  03/21/2016 11  12/09/2014 15   Creat (mg/dL)  Date Value  05/29/2013 0.84  02/27/2013 0.89   Creatinine, Ser (mg/dL)  Date Value  12/05/2016 0.75  06/06/2016 0.75  03/21/2016 0.79   Assessment:    Diabetes Mellitus type II, inadequately controlled with variable BG.   Plan:    1.  Rx changes:  Continue Januvia 100mg  qd  Increase Lantus to 65 units once daily  Change Humalog to 6 units with am meal   12 units with lunch   6 units with pm meal 2.  Education: Reviewed 'ABCs'  of diabetes management (respective goals in parentheses):  A1C (<7), blood pressure (<130/80), and cholesterol (LDL <100). 3.  Reviewed CHO counting diet and serving size recommendations.   4.  Continue to check BG 2-3 times daily.  Reviewed BG goals 5. Continue with PT and increase physical activity as able   No orders of the defined types were placed in this encounter.

## 2017-01-18 ENCOUNTER — Encounter: Payer: Self-pay | Admitting: Pharmacist

## 2017-01-21 ENCOUNTER — Ambulatory Visit: Payer: Medicare Other | Admitting: Physical Therapy

## 2017-01-21 ENCOUNTER — Encounter: Payer: Self-pay | Admitting: Physical Therapy

## 2017-01-21 DIAGNOSIS — M25511 Pain in right shoulder: Secondary | ICD-10-CM | POA: Diagnosis not present

## 2017-01-21 DIAGNOSIS — M25611 Stiffness of right shoulder, not elsewhere classified: Secondary | ICD-10-CM | POA: Diagnosis not present

## 2017-01-21 DIAGNOSIS — G8929 Other chronic pain: Secondary | ICD-10-CM | POA: Diagnosis not present

## 2017-01-21 NOTE — Therapy (Signed)
[facility] [place], [place], [address] Phone: [phone]   Fax:  [phone]  Physical Therapy Treatment  Patient Details  Name: Diana Pratt MRN: [id number] Date of Birth: 1946-03-16 Referring Provider: Justice Britain  Encounter Date: 01/21/2017      PT End of Session - 01/21/17 1149    Visit Number 45   Number of Visits 48   Date for PT Re-Evaluation 01/16/17   PT Start Time 1115   PT Stop Time 1201   PT Time Calculation (min) 46 min   Activity Tolerance Patient tolerated treatment well   Behavior During Therapy [facility] for tasks assessed/performed      Past Medical History:  Diagnosis Date  . Anxiety   . Asthma   . Back pain   . 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 COPD (chronic obstructive pulmonary disease) ([place])    dr. Kenn File  . Depression   . Diabetes mellitus   . Diabetic neuropathy ([place])   . GERD (gastroesophageal reflux disease)    occ heartburn  . Humerus fracture    right  . Hyperlipemia   . Hypertension   . Interstitial cystitis   . Osteoporosis   . Pneumonia    15 yrs ago  . PONV (postoperative nausea and vomiting)   . Shortness of breath dyspnea     Past Surgical History:  Procedure Laterality Date  . ABDOMINAL HYSTERECTOMY  03/1984   partial  . COLONOSCOPY N/A 07/19/2014   Procedure: COLONOSCOPY;  Surgeon: Danie Binder, MD;  [facility];  Service: Endoscopy;  Laterality: N/A;  9:30  . REVERSE SHOULDER ARTHROPLASTY Right 06/14/2016   Procedure: REVERSE SHOULDER ARTHROPLASTY;  Surgeon: Justice Britain, MD;  Location: [place];  Service: Orthopedics;  Laterality: Right;    There were no vitals filed for this visit.      Subjective Assessment - 01/21/17 1124    Subjective Patient reported ongoing soreness in shoulder yet improvement overall   Pertinent History DM, OP, COPD   Limitations Lifting   Patient Stated Goals get over this  pain and get back to using this arm   Currently in Pain? Yes   Pain Score 2    Pain Location Shoulder   Pain Orientation Right   Pain Descriptors / Indicators Sore   Pain Type Surgical pain   Pain Onset More than a month ago   Pain Frequency Intermittent   Aggravating Factors  prolong use overhead   Pain Relieving Factors at rest                         [facility] Adult PT Treatment/Exercise - 01/21/17 0001      Shoulder Exercises: Seated   Elevation Strengthening;Right;Weights  scaption 2x10     Shoulder Exercises: Prone   Other Prone Exercises kneeling for rows and ext 2# x20 each     Shoulder Exercises: Standing   Protraction Strengthening;Right;Theraband  3x10   Theraband Level (Shoulder Protraction) Level 1 (Yellow)   External Rotation Strengthening;Right;Theraband  3x10   Theraband Level (Shoulder External Rotation) Level 1 (Yellow)   Internal Rotation Strengthening;Right;Theraband  3x10   Theraband Level (Shoulder Internal Rotation) Level 1 (Yellow)   Row Strengthening;Right;Theraband  3x10   Theraband Level (Shoulder Row) Level 1 (Yellow)   Other Standing Exercises wall push up 2x10   Other Standing Exercises cone stacking shoulder level to ocerhead  then back to waist level x 1 rep     Shoulder Exercises: Pulleys   Flexion Other (comment)  44min   Other Pulley Exercises UE ranger for elevation and circles 2x10     Moist Heat Therapy   Number Minutes Moist Heat 15 Minutes   Moist Heat Location Shoulder     Electrical Stimulation   Electrical Stimulation Location R shoulder Premod  80-150hz  x 15 mins   Electrical Stimulation Goals Pain;Tone                  PT Short Term Goals - 10/15/16 1154      PT SHORT TERM GOAL #1   Title Improve R shoulder ER to 20 degrees   Status Achieved     PT SHORT TERM GOAL #2   Title decrease R shoulder pain to 5/10.   Status Achieved           PT Long Term Goals - 01/10/17 1150      PT LONG  TERM GOAL #1   Title I with HEP   Time 8   Period Weeks   Status Achieved     PT LONG TERM GOAL #2   Title Improved R shoulder ROM to [facility]   Time 8   Period Weeks   Status On-going     PT LONG TERM GOAL #3   Title Decreased pain in R shoulder to 2-3/10 with functional activities   Time 8   Period Weeks   Status Achieved  01/10/17     PT LONG TERM GOAL #4   Title R shoulder strength 4+/5 or better to improve function (Not to be addressed until okayed by protocol/MD)   Time 8   Period Weeks   Status On-going               Plan - 01/21/17 1150    Clinical Impression Statement Patient tolerated treatment well today. Patient progressing with right shoulder strengthening with overhead reaching/cone stacking. Patient had minimal soreness reported on incision site and fatugue so only able to complete one repetition of lifting and lowering. Patient progressing toward goals yet ongoing due to strength defict.   Rehab Potential Excellent   Clinical Impairments Affecting Rehab Potential 06/14/16 surgury/ current 31 week 01/17/17   PT Frequency 3x / week   PT Duration 2 weeks   PT Treatment/Interventions ADLs/Self Care Home Management;Electrical Stimulation;Patient/family education;Neuromuscular re-education;Therapeutic exercise;Manual techniques;Passive range of motion;Vasopneumatic Device   PT Next Visit Plan cont with POC per protocol given by MD (see binder) MD.    Consulted and Agree with Plan of Care Patient      Patient will benefit from skilled therapeutic intervention in order to improve the following deficits and impairments:  Decreased range of motion, Impaired UE functional use, Pain, Decreased strength  Visit Diagnosis: Chronic right shoulder pain  Stiffness of right shoulder, not elsewhere classified     Problem List Patient Active Problem List   Diagnosis Date Noted  . 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 Abdominal pain, acute, bilateral lower  quadrant 07/01/2014  . Transaminitis 07/01/2014  . COPD (chronic obstructive pulmonary disease) ([place])   . Asthma   . Interstitial cystitis   . Unspecified vitamin D deficiency 05/29/2013  . Lumbar radiculopathy 05/29/2013  . Back strain 05/29/2013  . Unspecified asthma(493.90) 02/27/2013  . Depression with anxiety 02/22/2011  . Uncontrolled type 2 diabetes mellitus with insulin therapy ([place]) 02/22/2011  . Hyperlipemia 02/22/2011  . HTN (hypertension)  02/22/2011    Navya Timmons P, PTA 01/21/2017, 12:09 PM  [facility] [place], [place], [address] Phone: [phone]   Fax:  [phone]  Name: KARMYN LOWMAN MRN: [id number] Date of Birth: [birth year]

## 2017-01-24 ENCOUNTER — Encounter: Admitting: Physical Therapy

## 2017-01-25 ENCOUNTER — Encounter: Admitting: *Deleted

## 2017-01-28 ENCOUNTER — Encounter: Admitting: Physical Therapy

## 2017-01-28 IMAGING — DX DG ABDOMEN 2V
3 series · 3 of 3 positions shown · non-contrast
Comparison: CT of the abdomen and pelvis from 05/31/2014

CLINICAL DATA: Acute onset of lower abdominal and rectal pain.
Initial encounter.

EXAM:
ABDOMEN - 2 VIEW

[abdomen erect]
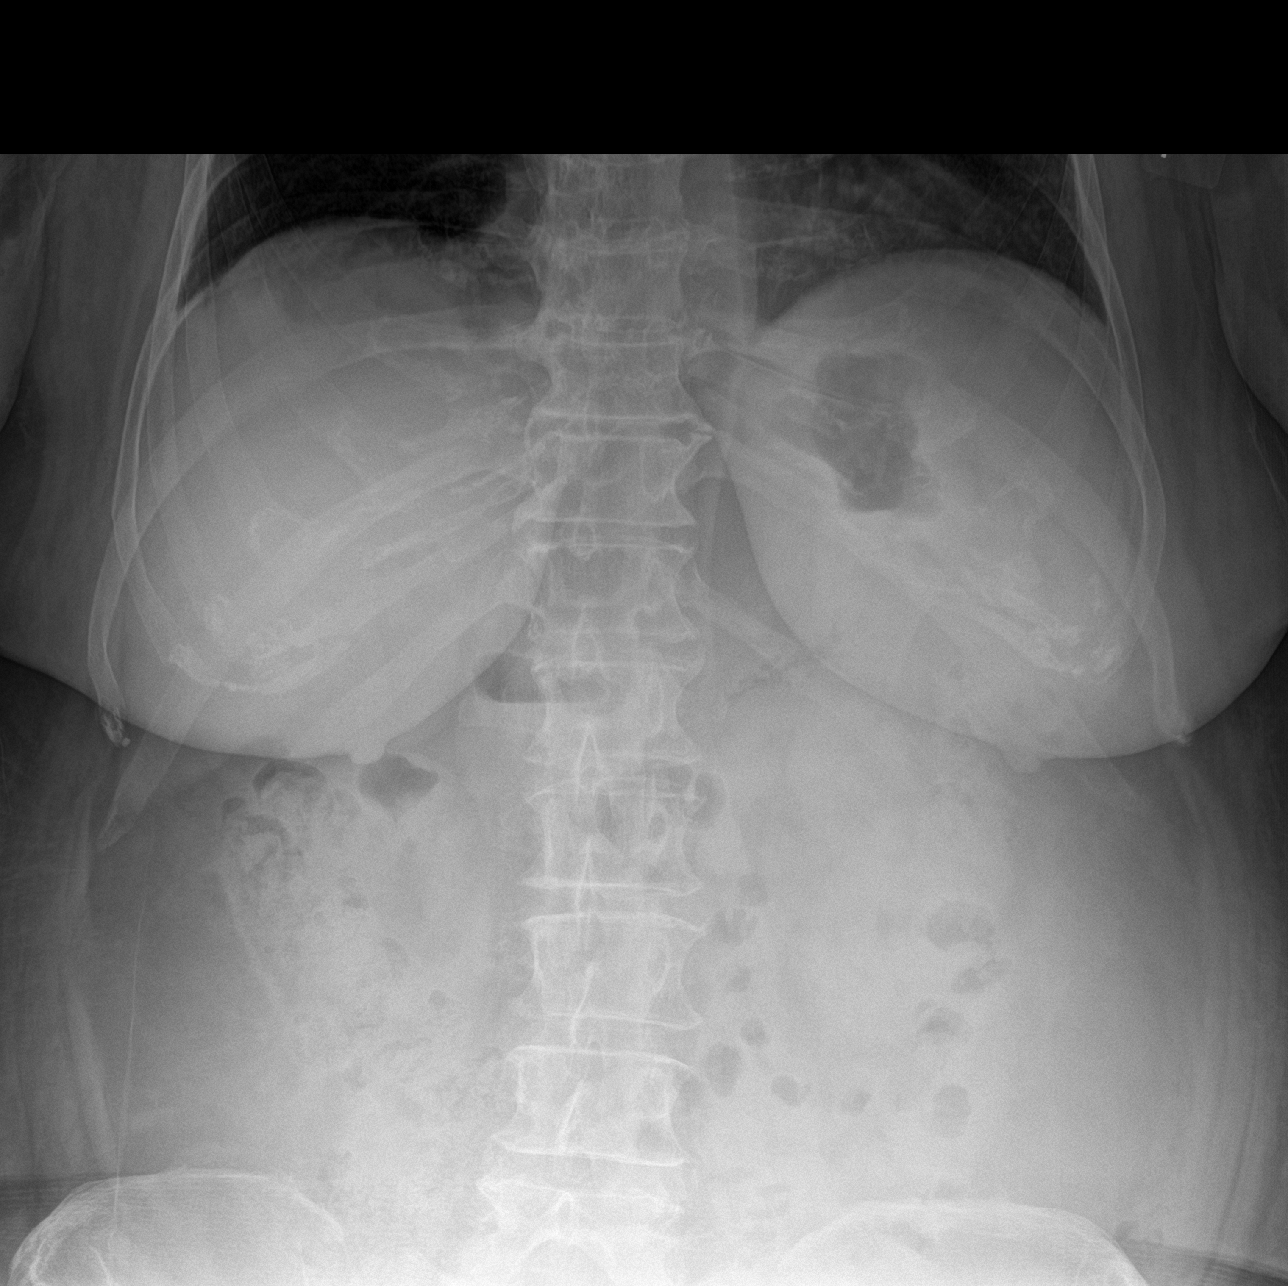

[abdomen supine (1 of 2)]
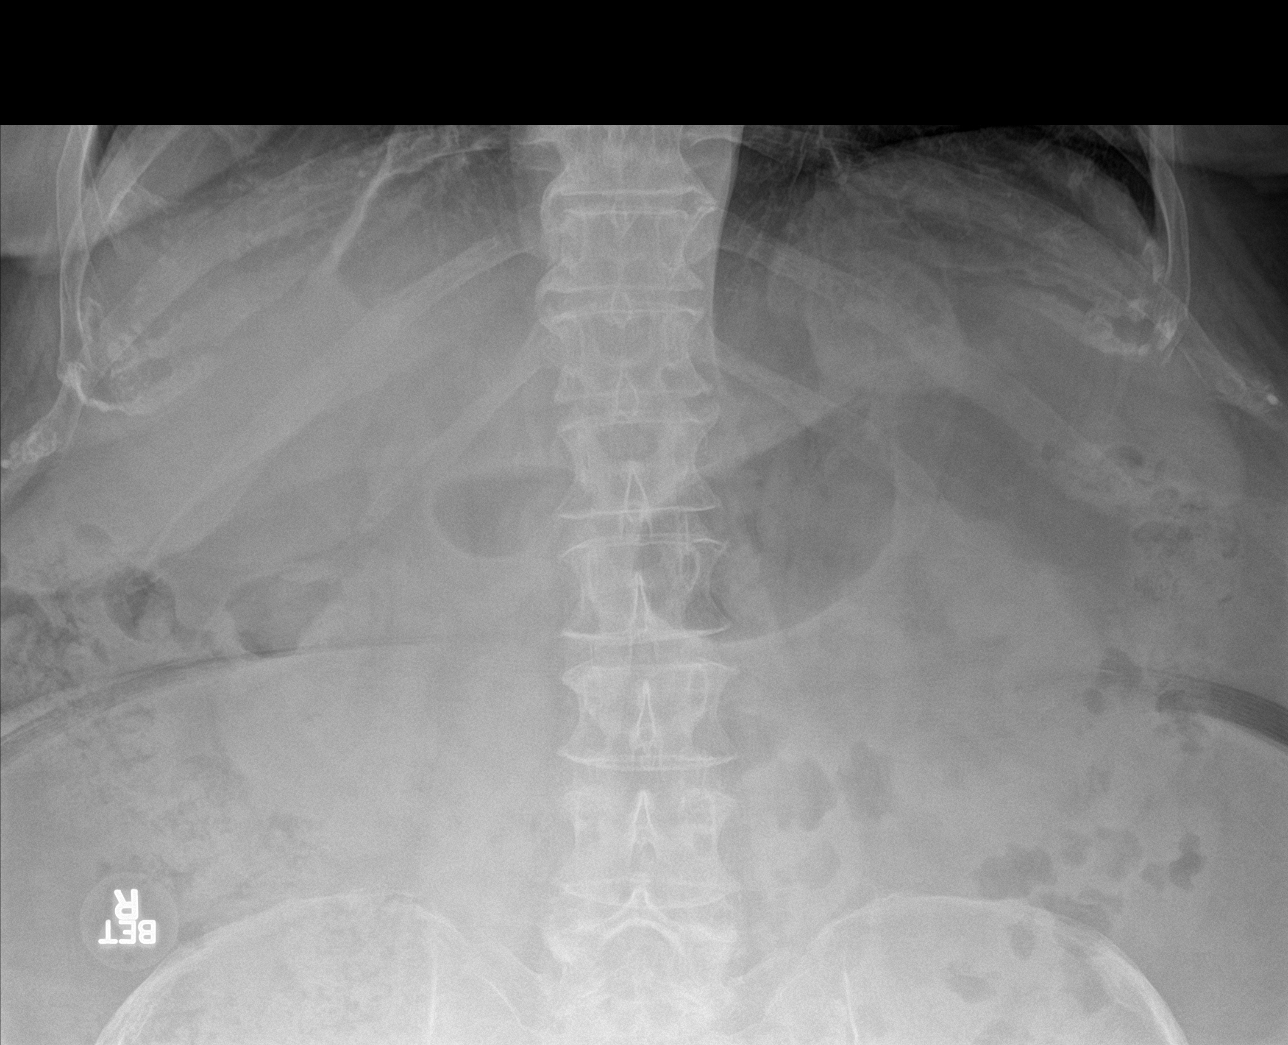

[abdomen supine (2 of 2)]
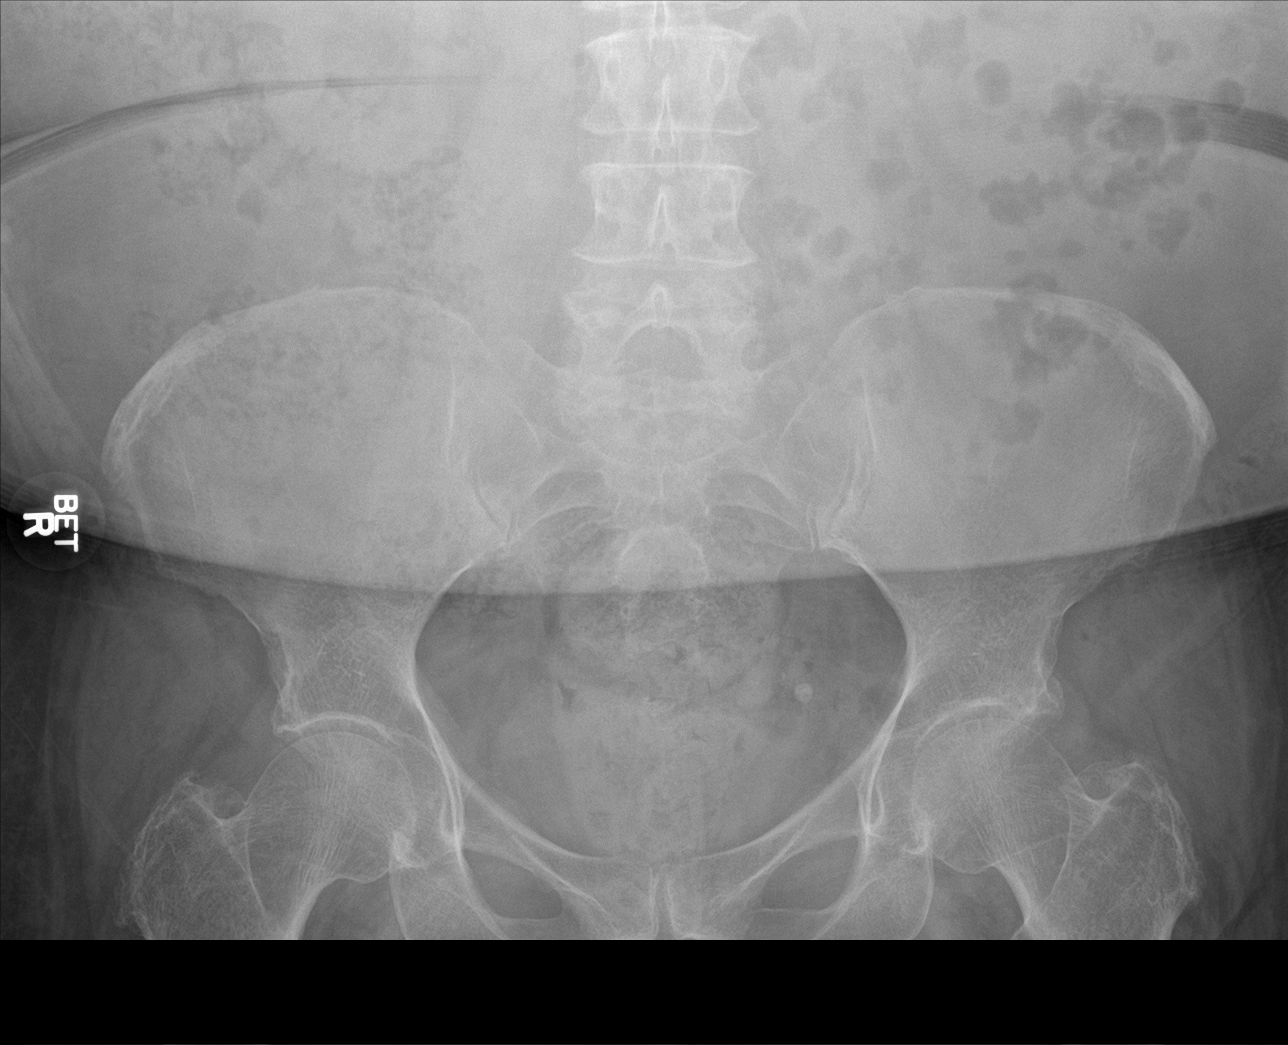

[3 of 3 positions shown; findings below may reference images not displayed]

FINDINGS: The visualized bowel gas pattern is unremarkable. Scattered air and
stool filled loops of colon are seen; no abnormal dilatation of
small bowel loops is seen to suggest small bowel obstruction. No
free intra-abdominal air is identified, though evaluation for free
air is limited on a single supine view.

The visualized osseous structures are within normal limits; the
sacroiliac joints are unremarkable in appearance. The visualized
lung bases are essentially clear.
IMPRESSION: Unremarkable bowel gas pattern; no free intra-abdominal air seen.
Moderate amount of stool noted in the colon.

## 2017-02-04 ENCOUNTER — Ambulatory Visit: Payer: Medicare Other | Attending: Orthopedic Surgery | Admitting: Physical Therapy

## 2017-02-04 ENCOUNTER — Encounter: Payer: Self-pay | Admitting: Physical Therapy

## 2017-02-04 DIAGNOSIS — M25511 Pain in right shoulder: Secondary | ICD-10-CM | POA: Insufficient documentation

## 2017-02-04 DIAGNOSIS — M25611 Stiffness of right shoulder, not elsewhere classified: Secondary | ICD-10-CM | POA: Insufficient documentation

## 2017-02-04 DIAGNOSIS — G8929 Other chronic pain: Secondary | ICD-10-CM | POA: Diagnosis not present

## 2017-02-04 NOTE — Therapy (Signed)
[facility] [place], [place], [address] Phone: [phone]   Fax:  [phone]  Physical Therapy Treatment  Patient Details  Name: Diana Pratt MRN: [id number] Date of Birth: 1946/01/03 Referring Provider: Justice Britain  Encounter Date: 02/04/2017      PT End of Session - 02/04/17 1046    Visit Number 59   Number of Visits 25   PT Start Time 6644   PT Stop Time 1118   PT Time Calculation (min) 48 min   Activity Tolerance Patient tolerated treatment well   Behavior During Therapy [facility] for tasks assessed/performed      Past Medical History:  Diagnosis Date  . Anxiety   . Asthma   . Back pain   . 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 COPD (chronic obstructive pulmonary disease) ([place])    dr. Kenn File  . Depression   . Diabetes mellitus   . Diabetic neuropathy ([place])   . GERD (gastroesophageal reflux disease)    occ heartburn  . Humerus fracture    right  . Hyperlipemia   . Hypertension   . Interstitial cystitis   . Osteoporosis   . Pneumonia    15 yrs ago  . PONV (postoperative nausea and vomiting)   . Shortness of breath dyspnea     Past Surgical History:  Procedure Laterality Date  . ABDOMINAL HYSTERECTOMY  03/1984   partial  . COLONOSCOPY N/A 07/19/2014   Procedure: COLONOSCOPY;  Surgeon: Danie Binder, MD;  [facility];  Service: Endoscopy;  Laterality: N/A;  9:30  . REVERSE SHOULDER ARTHROPLASTY Right 06/14/2016   Procedure: REVERSE SHOULDER ARTHROPLASTY;  Surgeon: Justice Britain, MD;  Location: [place];  Service: Orthopedics;  Laterality: Right;    There were no vitals filed for this visit.      Subjective Assessment - 02/04/17 1032    Subjective Patient reported some ongoing "soreness yet not as bad"   Pertinent History DM, OP, COPD   Limitations Lifting   Patient Stated Goals get over this pain and get back to using this arm   Currently in  Pain? Yes   Pain Score 2    Pain Location Shoulder   Pain Orientation Right   Pain Descriptors / Indicators Sore   Pain Type Surgical pain   Pain Onset More than a month ago   Pain Frequency Intermittent   Aggravating Factors  prolong overhead   Pain Relieving Factors at rest                         [facility] Adult PT Treatment/Exercise - 02/04/17 0001      Shoulder Exercises: Standing   Protraction Strengthening;Right;Theraband;20 reps   Theraband Level (Shoulder Protraction) Level 2 (Red)   External Rotation Strengthening;Right;Theraband;20 reps   Theraband Level (Shoulder External Rotation) Level 2 (Red)   Internal Rotation Strengthening;Right;Theraband;20 reps   Theraband Level (Shoulder Internal Rotation) Level 2 (Red)   Row Strengthening;Right;Theraband;20 reps   Theraband Level (Shoulder Row) Level 2 (Red)   Other Standing Exercises self IR stretch 10sec x5   Other Standing Exercises cone stacking shoulder level to overhead then back to waist level x 2 rep     Shoulder Exercises: Pulleys   Flexion Other (comment)  59min   Other Pulley Exercises UE ranger for elevation and circles 2x10   Other Pulley Exercises wall slide with eccentric lowering x65min  Moist Heat Therapy   Number Minutes Moist Heat 15 Minutes   Moist Heat Location Shoulder     Electrical Stimulation   Electrical Stimulation Location R shoulder Premod  80-150hz  x 15 mins   Electrical Stimulation Goals Pain;Tone                  PT Short Term Goals - 10/15/16 1154      PT SHORT TERM GOAL #1   Title Improve R shoulder ER to 20 degrees   Status Achieved     PT SHORT TERM GOAL #2   Title decrease R shoulder pain to 5/10.   Status Achieved           PT Long Term Goals - 01/10/17 1150      PT LONG TERM GOAL #1   Title I with HEP   Time 8   Period Weeks   Status Achieved     PT LONG TERM GOAL #2   Title Improved R shoulder ROM to [facility]   Time 8   Period Weeks    Status On-going     PT LONG TERM GOAL #3   Title Decreased pain in R shoulder to 2-3/10 with functional activities   Time 8   Period Weeks   Status Achieved  01/10/17     PT LONG TERM GOAL #4   Title R shoulder strength 4+/5 or better to improve function (Not to be addressed until okayed by protocol/MD)   Time 8   Period Weeks   Status On-going               Plan - 02/04/17 1049    Clinical Impression Statement Patient tolerated treatment well today and able to progress with right shoulder strengthening. Patient improved with reaching activity tolerance today. Patient able to perform ADL's with greater ease. Patient progressing toward all goals.   Rehab Potential Excellent   Clinical Impairments Affecting Rehab Potential 06/14/16 surgury/ current 34 week 02/07/17   PT Frequency 3x / week   PT Duration 2 weeks   PT Treatment/Interventions ADLs/Self Care Home Management;Electrical Stimulation;Patient/family education;Neuromuscular re-education;Therapeutic exercise;Manual techniques;Passive range of motion;Vasopneumatic Device   PT Next Visit Plan DC next visit with HEP including self IR stretch   Consulted and Agree with Plan of Care Patient      Patient will benefit from skilled therapeutic intervention in order to improve the following deficits and impairments:  Decreased range of motion, Impaired UE functional use, Pain, Decreased strength  Visit Diagnosis: Chronic right shoulder pain  Stiffness of right shoulder, not elsewhere classified     Problem List Patient Active Problem List   Diagnosis Date Noted  . 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 Abdominal pain, acute, bilateral lower quadrant 07/01/2014  . Transaminitis 07/01/2014  . COPD (chronic obstructive pulmonary disease) ([place])   . Asthma   . Interstitial cystitis   . Unspecified vitamin D deficiency 05/29/2013  . Lumbar radiculopathy 05/29/2013  . Back strain 05/29/2013  . Unspecified  asthma(493.90) 02/27/2013  . Depression with anxiety 02/22/2011  . Uncontrolled type 2 diabetes mellitus with insulin therapy ([place]) 02/22/2011  . Hyperlipemia 02/22/2011  . HTN (hypertension) 02/22/2011    Baraa Tubbs P, PTA 02/04/2017, 11:21 AM  [facility] [place], [place], [address] Phone: [phone]   Fax:  [phone]  Name: Diana Pratt MRN: [id number] Date of Birth: 1945-11-25

## 2017-02-07 ENCOUNTER — Encounter: Admitting: Physical Therapy

## 2017-02-11 ENCOUNTER — Encounter: Admitting: Physical Therapy

## 2017-02-11 ENCOUNTER — Encounter: Payer: Self-pay | Admitting: Physical Therapy

## 2017-02-11 ENCOUNTER — Ambulatory Visit: Payer: Medicare Other | Admitting: Physical Therapy

## 2017-02-11 DIAGNOSIS — G8929 Other chronic pain: Secondary | ICD-10-CM | POA: Diagnosis not present

## 2017-02-11 DIAGNOSIS — M25611 Stiffness of right shoulder, not elsewhere classified: Secondary | ICD-10-CM

## 2017-02-11 DIAGNOSIS — M25511 Pain in right shoulder: Secondary | ICD-10-CM

## 2017-02-11 NOTE — Patient Instructions (Signed)
                  Lie on your left side letting your R arm hang behind your back, hold 30 seconds, repeat 3 times

## 2017-02-11 NOTE — Therapy (Signed)
[facility] [place], [place], [address] Phone: [phone]   Fax:  [phone]  Physical Therapy Treatment/Discharge Summary  Patient Details  Name: Diana Pratt MRN: [id number] Date of Birth: May 04, 1946 Referring Provider: Justice Britain  Encounter Date: 02/11/2017      PT End of Session - 02/11/17 1423    Visit Number 48   Number of Visits 48   Date for PT Re-Evaluation 01/16/17   PT Start Time 1230   PT Stop Time 1305   PT Time Calculation (min) 35 min   Activity Tolerance Patient tolerated treatment well   Behavior During Therapy [facility] for tasks assessed/performed      Past Medical History:  Diagnosis Date  . Anxiety   . Asthma   . Back pain   . 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 COPD (chronic obstructive pulmonary disease) ([place])    dr. Kenn File  . Depression   . Diabetes mellitus   . Diabetic neuropathy ([place])   . GERD (gastroesophageal reflux disease)    occ heartburn  . Humerus fracture    right  . Hyperlipemia   . Hypertension   . Interstitial cystitis   . Osteoporosis   . Pneumonia    15 yrs ago  . PONV (postoperative nausea and vomiting)   . Shortness of breath dyspnea     Past Surgical History:  Procedure Laterality Date  . ABDOMINAL HYSTERECTOMY  03/1984   partial  . COLONOSCOPY N/A 07/19/2014   Procedure: COLONOSCOPY;  Surgeon: Danie Binder, MD;  Location: [facility];  Service: Endoscopy;  Laterality: N/A;  9:30  . REVERSE SHOULDER ARTHROPLASTY Right 06/14/2016   Procedure: REVERSE SHOULDER ARTHROPLASTY;  Surgeon: Justice Britain, MD;  Location: [facility];  Service: Orthopedics;  Laterality: Right;    There were no vitals filed for this visit.      Subjective Assessment - 02/11/17 1416    Subjective Patient arriving reporting soreness in R shoulder rated 2/10 pain. Patient feels she has made progress with therapy.    Pertinent History DM, OP,  COPD   Limitations Lifting   Patient Stated Goals get over this pain and get back to using this arm   Currently in Pain? Yes   Pain Score 2    Pain Location Shoulder   Pain Orientation Right   Pain Descriptors / Indicators Sore   Pain Type Surgical pain   Pain Onset More than a month ago   Aggravating Factors  overhead reaching   Pain Relieving Factors resting   Effect of Pain on Daily Activities some with certain movements            [facility] PT Assessment - 02/11/17 0001      AROM   AROM Assessment Site Shoulder   Right/Left Shoulder Right   Right Shoulder Flexion 128 Degrees   Right Shoulder External Rotation 58 Degrees     PROM   PROM Assessment Site Shoulder   Right/Left Shoulder Right   Right Shoulder Flexion 135 Degrees   Right Shoulder External Rotation 60 Degrees                             PT Education - 02/11/17 1418    Education provided Yes   Education Details HEP    Person(s) Educated Patient   Methods Explanation;Demonstration;Handout   Comprehension Verbalized understanding;Returned demonstration  PT Short Term Goals - 2017-02-26 1443      PT SHORT TERM GOAL #1   Title Improve R shoulder ER to 20 degrees   Time 4   Status Achieved     PT SHORT TERM GOAL #2   Title decrease R shoulder pain to 5/10.   Period Weeks   Status Achieved           PT Long Term Goals - 26-Feb-2017 1253      PT LONG TERM GOAL #1   Title I with HEP   Time 8   Period Weeks   Status Achieved     PT LONG TERM GOAL #2   Baseline AROM: 128 degrees flexion, 58 degrees ER, unable to reach her spine with IR, 50 degrees of extension   Time 8   Period Weeks   Status Not Met     PT LONG TERM GOAL #3   Title Decreased pain in R shoulder to 2-3/10 with functional activities   Status Achieved     PT LONG TERM GOAL #4   Title R shoulder strength 4+/5 or better to improve function (Not to be addressed until okayed by protocol/MD)   Baseline  3/5 R shoulder strength   Time 8   Period Weeks   Status Not Met               Plan - 02/26/17 1429    Clinical Impression Statement Patient arriving today after 17 visits this year and 28 visits over all since her reverse total shoulder surgery. Pt has made slow progress and has met 2/4 goals set at her initial eval. Pt currently has AROM in R shoulder of 128 degrees flexion and 58 degrees ER. Pt reporting 2/10 pain which is intermittent depending on the activity. Pt has been issued a HEP and new handout issued today during session.    Rehab Potential Excellent   PT Frequency 3x / week   PT Duration 2 weeks   PT Treatment/Interventions ADLs/Self Care Home Management;Electrical Stimulation;Patient/family education;Neuromuscular re-education;Therapeutic exercise;Manual techniques;Passive range of motion;Vasopneumatic Device   PT Next Visit Plan HEP including self IR stretch   PT Home Exercise Plan elbow, wrist ROM and hand squeezes   Consulted and Agree with Plan of Care Patient      Patient will benefit from skilled therapeutic intervention in order to improve the following deficits and impairments:  Decreased range of motion, Impaired UE functional use, Pain, Decreased strength  Visit Diagnosis: Chronic right shoulder pain  Stiffness of right shoulder, not elsewhere classified  Right shoulder pain, unspecified chronicity  Acute pain of right shoulder       G-Codes - 02/26/17 1444    Functional Assessment Tool Used (Outpatient Only) 40th visit...clinical judgement.   Functional Limitation Other PT primary   Other PT Primary Current Status (Y6599) At least 20 percent but less than 40 percent impaired, limited or restricted   Other PT Primary Goal Status (J5701) At least 20 percent but less than 40 percent impaired, limited or restricted   Other PT Primary Discharge Status [id number]) At least 20 percent but less than 40 percent impaired, limited or restricted      Problem  List Patient Active Problem List   Diagnosis Date Noted  . 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 Abdominal pain, acute, bilateral lower quadrant 07/01/2014  . Transaminitis 07/01/2014  . COPD (chronic obstructive pulmonary disease) ([place])   . Asthma   . Interstitial cystitis   .  Unspecified vitamin D deficiency 05/29/2013  . Lumbar radiculopathy 05/29/2013  . Back strain 05/29/2013  . Unspecified asthma(493.90) 02/27/2013  . Depression with anxiety 02/22/2011  . Uncontrolled type 2 diabetes mellitus with insulin therapy ([place]) 02/22/2011  . Hyperlipemia 02/22/2011  . HTN (hypertension) 02/22/2011    PHYSICAL THERAPY DISCHARGE SUMMARY  Visits from Start of Care: 48 visits (17 visits since November 05, 2016)  Current functional level related to goals / functional outcomes: See above   Remaining deficits: See above   Education: HEP issued to pt with handout included Plan: Patient agrees to discharge.  Patient goals were not met. Patient is being discharged due to being pleased with the current functional level.  ?????        ETOY MCDONNELL, MPT 02/11/2017, 2:46 PM  [facility] [place], [place], [address] Phone: [phone]   Fax:  [phone]  Name: ESTEPHANIA LICCIARDI MRN: [id number] Date of Birth: [birth year]

## 2017-02-15 ENCOUNTER — Ambulatory Visit: Payer: Medicare Other | Admitting: Family Medicine

## 2017-02-18 ENCOUNTER — Ambulatory Visit: Payer: Medicare Other | Admitting: Family Medicine

## 2017-02-19 ENCOUNTER — Ambulatory Visit: Payer: Medicare Other | Admitting: Family Medicine

## 2017-03-01 ENCOUNTER — Other Ambulatory Visit: Payer: Self-pay | Admitting: Family Medicine

## 2017-03-22 ENCOUNTER — Encounter: Payer: Self-pay | Admitting: Family Medicine

## 2017-03-22 ENCOUNTER — Ambulatory Visit (INDEPENDENT_AMBULATORY_CARE_PROVIDER_SITE_OTHER): Payer: Medicare Other | Admitting: Family Medicine

## 2017-03-22 VITALS — BP 153/85 | HR 95 | Temp 96.9°F | Ht 64.0 in | Wt 213.4 lb

## 2017-03-22 DIAGNOSIS — IMO0002 Reserved for concepts with insufficient information to code with codable children: Secondary | ICD-10-CM

## 2017-03-22 DIAGNOSIS — J449 Chronic obstructive pulmonary disease, unspecified: Secondary | ICD-10-CM | POA: Diagnosis not present

## 2017-03-22 DIAGNOSIS — E1165 Type 2 diabetes mellitus with hyperglycemia: Secondary | ICD-10-CM | POA: Diagnosis not present

## 2017-03-22 DIAGNOSIS — Z794 Long term (current) use of insulin: Secondary | ICD-10-CM | POA: Diagnosis not present

## 2017-03-22 DIAGNOSIS — N949 Unspecified condition associated with female genital organs and menstrual cycle: Secondary | ICD-10-CM

## 2017-03-22 DIAGNOSIS — I10 Essential (primary) hypertension: Secondary | ICD-10-CM | POA: Diagnosis not present

## 2017-03-22 LAB — BAYER DCA HB A1C WAIVED: HB A1C: 9.3 % — AB (ref <7.0)

## 2017-03-22 MED ORDER — LORAZEPAM 1 MG PO TABS: ORAL_TABLET | ORAL | 2 refills | Status: DC

## 2017-03-22 MED ORDER — LEVOFLOXACIN 500 MG PO TABS: 500.0000 mg | ORAL_TABLET | Freq: Every day | ORAL | 0 refills | Status: DC

## 2017-03-22 NOTE — Progress Notes (Signed)
   HPI  Patient presents today here for follow-up chronic medical conditions.  COPD Good medication compliance with Symbicort, increased cough and increased sputum over the last 1 week or so. No chest pain, no fever, chills, sweats Albuterol is helping.  Diabetes Good medication compliance reported Average fasting 150-160 Average postprandial 100 9220. 10 repeat her Lantus and Humalog regimen easily.  Hypertension Good medication compliance and tolerance. States that average systolic at home is 931P.  Needs refill of Ativan.  Vaginal burning, accompanied by incomplete bladder emptying for several weeks. Patient also complains of hot flashes for a few weeks. Patient states that she had a hysterectomy previously and was told that her "bladder had dropped" No fever, chills, sweats. No significant vaginal discharge  PMH: Smoking status noted ROS: Per HPI  Objective: BP (!) 153/85   Pulse 95   Temp (!) 96.9 F (36.1 C) (Oral)   Ht '5\' 4"'$  (1.626 m)   Wt 213 lb 6.4 oz (96.8 kg)   BMI 36.63 kg/m  Gen: NAD, alert, cooperative with exam HEENT: NCAT CV: RRR, good S1/S2, no murmur Resp: CTABL, no wheezes, non-labored Ext: No edema, warm Neuro: Alert and oriented, No gross deficits  Assessment and plan:  # Type 2 diabetes Uncontrolled, I expect some improvement in her A1c, A1c is pending. Continue Lantus plus Humalog plus Januvia. No hypoglycemia  # Hypertension Elevated today, however patient reports good blood pressures at home Her pulse has improved while she was resting here in the clinic Follow-up One month, labs  # Vaginal burning Patient reports vaginal burning for quite some time, hot flashes, and difficulty with complete voiding. She states that she was told she had a cystocele after hysterectomy GYN referral  # COPD COPD exacerbation Treated with Levaquin, no need for steroids at this time. Continue Symbicort and albuterol    Orders Placed This  Encounter  Procedures  . CMP14+EGFR  . CBC with Differential/Platelet  . Bayer DCA Hb A1c Waived  . Ambulatory referral to Obstetrics / Gynecology    Referral Priority:   Routine    Referral Type:   Consultation    Referral Reason:   Specialty Services Required    Requested Specialty:   Obstetrics and Gynecology    Number of Visits Requested:   1    Meds ordered this encounter  Medications  . levofloxacin (LEVAQUIN) 500 MG tablet    Sig: Take 1 tablet (500 mg total) by mouth daily.    Dispense:  7 tablet    Refill:  0    Laroy Apple, MD [facility] 03/22/2017, 8:26 AM

## 2017-03-22 NOTE — Patient Instructions (Signed)
Great to see you!  Please be sure to finish all antibiotics  You will receive a call from GYN for an appointment  Please come back to me for follow up in 1 month

## 2017-03-23 LAB — CMP14+EGFR
ALK PHOS: 86 IU/L (ref 39–117)
ALT: 110 IU/L — ABNORMAL HIGH (ref 0–32)
AST: 88 IU/L — AB (ref 0–40)
Albumin/Globulin Ratio: 1.5 (ref 1.2–2.2)
Albumin: 4.3 g/dL (ref 3.5–4.8)
BILIRUBIN TOTAL: 0.5 mg/dL (ref 0.0–1.2)
BUN/Creatinine Ratio: 19 (ref 12–28)
BUN: 16 mg/dL (ref 8–27)
CHLORIDE: 100 mmol/L (ref 96–106)
CO2: 23 mmol/L (ref 18–29)
CREATININE: 0.83 mg/dL (ref 0.57–1.00)
Calcium: 9.5 mg/dL (ref 8.7–10.3)
GFR calc Af Amer: 82 mL/min/{1.73_m2} (ref >59)
GFR calc non Af Amer: 71 mL/min/{1.73_m2} (ref >59)
GLUCOSE: 227 mg/dL — AB (ref 65–99)
Globulin, Total: 2.9 g/dL (ref 1.5–4.5)
Potassium: 4.3 mmol/L (ref 3.5–5.2)
Sodium: 141 mmol/L (ref 134–144)
Total Protein: 7.2 g/dL (ref 6.0–8.5)

## 2017-03-23 LAB — CBC WITH DIFFERENTIAL/PLATELET
BASOS ABS: 0 10*3/uL (ref 0.0–0.2)
Basos: 0 %
EOS (ABSOLUTE): 0.1 10*3/uL (ref 0.0–0.4)
Eos: 2 %
Hematocrit: 44.2 % (ref 34.0–46.6)
Hemoglobin: 14.6 g/dL (ref 11.1–15.9)
IMMATURE GRANS (ABS): 0 10*3/uL (ref 0.0–0.1)
IMMATURE GRANULOCYTES: 0 %
LYMPHS: 51 %
Lymphocytes Absolute: 2.6 10*3/uL (ref 0.7–3.1)
MCH: 30.4 pg (ref 26.6–33.0)
MCHC: 33 g/dL (ref 31.5–35.7)
MCV: 92 fL (ref 79–97)
MONOCYTES: 6 %
Monocytes Absolute: 0.3 10*3/uL (ref 0.1–0.9)
Neutrophils Absolute: 2.1 10*3/uL (ref 1.4–7.0)
Neutrophils: 41 %
Platelets: 190 10*3/uL (ref 150–379)
RBC: 4.81 x10E6/uL (ref 3.77–5.28)
RDW: 13.9 % (ref 12.3–15.4)
WBC: 5 10*3/uL (ref 3.4–10.8)

## 2017-03-25 ENCOUNTER — Other Ambulatory Visit: Payer: Self-pay | Admitting: Pharmacist

## 2017-03-25 MED ORDER — BLOOD GLUCOSE MONITOR KIT: PACK | 0 refills | Status: DC

## 2017-03-25 MED ORDER — INSULIN GLARGINE 100 UNIT/ML SOLOSTAR PEN: 68.0000 [IU] | PEN_INJECTOR | Freq: Every day | SUBCUTANEOUS | 1 refills | Status: DC

## 2017-03-25 NOTE — Addendum Note (Signed)
Addended by: Nigel Berthold C on: 03/25/2017 11:19 AM   Modules accepted: Orders

## 2017-03-25 NOTE — Telephone Encounter (Signed)
I called patient at her request because she had several questions after getting lab results.  She asked about referral to endocrinologist.  I reinforced Dr Alen Bleacher recommendation for referral.  She asked if she could see endo in [place] and I will check with Dr Wendi Snipes to see what is his preference.  She also states that she has taken levofloxacin for 4 days with no improvement in UTI symptoms.  I reminded her that she still has 3 days of ABX left but I will forward to her PCP.   I did recommend that she increase Lantus to 68 units daily and sent in Rx for new glucometer at patient's request.

## 2017-03-26 ENCOUNTER — Telehealth: Payer: Self-pay | Admitting: Family Medicine

## 2017-03-26 NOTE — Telephone Encounter (Signed)
Rx was sent yesterday. Patient to call [facility] to let them know which glucometer she would like.

## 2017-03-27 ENCOUNTER — Telehealth: Payer: Self-pay | Admitting: Family Medicine

## 2017-03-27 MED ORDER — FLUCONAZOLE 150 MG PO TABS: ORAL_TABLET | ORAL | 0 refills | Status: DC

## 2017-03-27 NOTE — Telephone Encounter (Signed)
Pt aware Rx sent to pharmacy As well as recommendation of taking Ibuprofen

## 2017-03-27 NOTE — Telephone Encounter (Signed)
Diflucan sent  Can try 400 mg ibuprofen if needed given her labs, although I would limit to less than 2 doses a day.   Laroy Apple, MD [facility] 03/27/2017, 9:58 AM

## 2017-03-27 NOTE — Telephone Encounter (Signed)
Pt was up all night with leg pains Instructed other day with lab results not to take tylenol Dr. Sabra Heck instructed her as a diabetic not to take Aleve What should she take  Would like two Diflucan called in to [place] she usually gets yeast infections when on abx

## 2017-03-29 ENCOUNTER — Telehealth: Payer: Self-pay | Admitting: Family Medicine

## 2017-03-29 NOTE — Telephone Encounter (Signed)
Patient advised to take 5 units of Humalog and recheck BG in 30 minutes. If still over 350 she is to call office.  She is coming in 04/02/17 and will adjust insulin as needed.

## 2017-03-29 NOTE — Telephone Encounter (Signed)
She states her BS was last 50 - she has questions about how much humalog she can use "extra".. Wants a call from [place].  She is also having some urine trouble - she cant come today - appt made for Tuesday for this

## 2017-04-02 ENCOUNTER — Encounter: Payer: Self-pay | Admitting: Family Medicine

## 2017-04-02 ENCOUNTER — Ambulatory Visit (INDEPENDENT_AMBULATORY_CARE_PROVIDER_SITE_OTHER): Payer: Medicare Other | Admitting: Family Medicine

## 2017-04-02 VITALS — BP 102/46 | HR 72 | Temp 97.0°F | Ht 64.0 in | Wt 216.0 lb

## 2017-04-02 DIAGNOSIS — R300 Dysuria: Secondary | ICD-10-CM

## 2017-04-02 LAB — URINALYSIS, COMPLETE
BILIRUBIN UA: NEGATIVE
Glucose, UA: NEGATIVE
Ketones, UA: NEGATIVE
LEUKOCYTES UA: NEGATIVE
Nitrite, UA: NEGATIVE
PH UA: 5.5 (ref 5.0–7.5)
Protein, UA: NEGATIVE
RBC, UA: NEGATIVE
Specific Gravity, UA: 1.02 (ref 1.005–1.030)
Urobilinogen, Ur: 0.2 mg/dL (ref 0.2–1.0)

## 2017-04-02 LAB — MICROSCOPIC EXAMINATION
Epithelial Cells (non renal): >10 /hpf — AB (ref 0–10)
Renal Epithel, UA: NONE SEEN /hpf

## 2017-04-02 MED ORDER — NITROFURANTOIN MONOHYD MACRO 100 MG PO CAPS: 100.0000 mg | ORAL_CAPSULE | Freq: Two times a day (BID) | ORAL | 0 refills | Status: DC

## 2017-04-02 NOTE — Progress Notes (Signed)
Chief Complaint  Patient presents with  . Dysuria    pt here today c/o flank pain and dysuria after completing a round of Levaquin    HPI  Patient presents today for burning with urination and frequency for several days. Denies fever . No flank pain. No nausea, vomiting.Used antibiotics several days from an appointment on May 18. No relief. Symptoms persist unchanged. Review of chart shows that symptoms are bit different in Dr. Alen Bleacher note) she had vaginal burning at that time. Week's course of levofloxacin was prescribed.   PMH: Smoking status noted ROS: Per HPI  Objective: BP (!) 102/46   Pulse 72   Temp 97 F (36.1 C) (Oral)   Ht 5\' 4"  (1.626 m)   Wt 216 lb (98 kg)   BMI 37.08 kg/m  Gen: NAD, alert, cooperative with exam HEENT: NCAT, EOMI, PERRL CV: RRR, good S1/S2, no murmur Resp: CTABL, no wheezes, non-labored Abd: SNTND, BS present, no guarding or organomegaly Ext: No edema, warm Neuro: Alert and oriented, No gross deficits   Assessment and plan:  1. Dysuria     Meds ordered this encounter  Medications  . nitrofurantoin, macrocrystal-monohydrate, (MACROBID) 100 MG capsule    Sig: Take 1 capsule (100 mg total) by mouth 2 (two) times daily.    Dispense:  14 capsule    Refill:  0    Orders Placed This Encounter  Procedures  . Urine culture  . Urinalysis, Complete    Follow up as needed.  Claretta Fraise, MD

## 2017-04-06 LAB — URINE CULTURE

## 2017-04-08 ENCOUNTER — Other Ambulatory Visit: Payer: Self-pay | Admitting: *Deleted

## 2017-04-08 MED ORDER — FLUCONAZOLE 150 MG PO TABS: ORAL_TABLET | ORAL | 0 refills | Status: DC

## 2017-04-10 ENCOUNTER — Encounter: Payer: Self-pay | Admitting: Adult Health

## 2017-04-10 ENCOUNTER — Ambulatory Visit (INDEPENDENT_AMBULATORY_CARE_PROVIDER_SITE_OTHER): Payer: Medicare Other | Admitting: Adult Health

## 2017-04-10 VITALS — BP 110/58 | HR 89 | Ht 63.0 in | Wt 209.5 lb

## 2017-04-10 DIAGNOSIS — N952 Postmenopausal atrophic vaginitis: Secondary | ICD-10-CM | POA: Diagnosis not present

## 2017-04-10 DIAGNOSIS — R32 Unspecified urinary incontinence: Secondary | ICD-10-CM | POA: Insufficient documentation

## 2017-04-10 DIAGNOSIS — N898 Other specified noninflammatory disorders of vagina: Secondary | ICD-10-CM | POA: Insufficient documentation

## 2017-04-10 LAB — POCT URINALYSIS DIPSTICK
Blood, UA: NEGATIVE
Glucose, UA: NEGATIVE
KETONES UA: NEGATIVE
LEUKOCYTES UA: NEGATIVE
Nitrite, UA: NEGATIVE
PROTEIN UA: NEGATIVE

## 2017-04-10 MED ORDER — ESTROGENS, CONJUGATED 0.625 MG/GM VA CREA: TOPICAL_CREAM | VAGINAL | 0 refills | Status: DC

## 2017-04-10 NOTE — Progress Notes (Signed)
Subjective:     Patient ID: Diana Pratt, female   DOB: 1946-06-23, 71 y.o.   MRN: [id number]  HPI Diana Pratt is a 71 year old white female, married in complaining of vaginal dryness and loss of bladder control for about 3 weeks now, has recently been treated for UTI by Dr Wendi Snipes.She is diabetic, and has hysterectomy years ago for fibroids and bleeding.She denies stroke, MI, DVT or breast cancer.  Review of Systems Vaginal dryness Loss of bladder control for 3 weeks Reviewed past medical,surgical, social and family history. Reviewed medications and allergies.     Objective:   Physical Exam BP (!) 110/58 (BP Location: Left Arm, Patient Position: Sitting, Cuff Size: Large)   Pulse 89   Ht 5\' 3"  (1.6 m)   Wt 209 lb 8 oz (95 kg)   BMI 37.11 kg/m Urine dipstick negative, Skin warm and dry.Pelvic: external genitalia is normal in appearance no lesions, vagina: pale,dry and loss of rugae, with atrophy,urethra has no lesions or masses noted, cervix and uterus are absent,adnexa: no masses or tenderness noted. Bladder is non tender and no masses felt   I recommend trying premarin vaginal cream and see if that helps, if not may need urology referral. Explained that tissues are thin and have dried out in vagina and that can affect, bladder and urethra too.  ' Assessment:     1. Vaginal dryness   2. Vaginal atrophy   3. Urinary incontinence, unspecified type       Plan:     Given samples of premarin vaginal cream 24 gm, use 0.5 gm at hs for 2 weeks then 2-3 x weekly Follow up with me in 3 weeks

## 2017-04-16 ENCOUNTER — Ambulatory Visit: Payer: Self-pay | Admitting: Pharmacist

## 2017-04-24 DIAGNOSIS — E1140 Type 2 diabetes mellitus with diabetic neuropathy, unspecified: Secondary | ICD-10-CM | POA: Diagnosis not present

## 2017-04-24 DIAGNOSIS — I10 Essential (primary) hypertension: Secondary | ICD-10-CM | POA: Diagnosis not present

## 2017-04-24 DIAGNOSIS — E7800 Pure hypercholesterolemia, unspecified: Secondary | ICD-10-CM | POA: Diagnosis not present

## 2017-04-24 DIAGNOSIS — E1165 Type 2 diabetes mellitus with hyperglycemia: Secondary | ICD-10-CM | POA: Diagnosis not present

## 2017-04-25 ENCOUNTER — Ambulatory Visit: Payer: Medicare Other | Admitting: Family Medicine

## 2017-05-02 ENCOUNTER — Encounter: Payer: Self-pay | Admitting: Adult Health

## 2017-05-02 ENCOUNTER — Ambulatory Visit (INDEPENDENT_AMBULATORY_CARE_PROVIDER_SITE_OTHER): Payer: Medicare Other | Admitting: Adult Health

## 2017-05-02 VITALS — BP 100/60 | HR 76 | Ht 64.0 in | Wt 210.0 lb

## 2017-05-02 DIAGNOSIS — N898 Other specified noninflammatory disorders of vagina: Secondary | ICD-10-CM

## 2017-05-02 DIAGNOSIS — R32 Unspecified urinary incontinence: Secondary | ICD-10-CM

## 2017-05-02 DIAGNOSIS — N952 Postmenopausal atrophic vaginitis: Secondary | ICD-10-CM | POA: Diagnosis not present

## 2017-05-02 MED ORDER — ESTROGENS, CONJUGATED 0.625 MG/GM VA CREA: TOPICAL_CREAM | VAGINAL | 3 refills | Status: DC

## 2017-05-02 NOTE — Progress Notes (Signed)
Subjective:     Patient ID: ANETA HENDERSHOTT, female   DOB: Mar 29, 1946, 71 y.o.   MRN: [id number]  HPI Hanifa is a 71 year old white female, back in follow up of starting PVC for vaginal dryness and loss of bladder control, and she says she is better.   Review of Systems Less vaginal dryness and less UI, since starting PVC Reviewed past medical,surgical, social and family history. Reviewed medications and allergies.     Objective:   Physical Exam BP 100/60 (BP Location: Left Arm, Patient Position: Sitting, Cuff Size: Small)   Pulse 76   Ht 5\' 4"  (1.626 m)   Wt 210 lb (95.3 kg)   BMI 36.05 kg/m   Skin warm and dry.Pelvic: external genitalia is normal in appearance no lesions, vagina: tissues are less pale and has more moisture, has some white PVC present in vault,urethra has no lesions or masses noted, cervix and uterus are absent.   Assessment:     1. Vaginal dryness   2. Vaginal atrophy   3. Urinary incontinence, unspecified type       Plan:    Continue PVC 2-3 x weekly Meds ordered this encounter  Medications  . conjugated estrogens (PREMARIN) vaginal cream    Sig: Use 0.5mg  in vagina at hs  2-3 x weekly    Dispense:  30 g    Refill:  3    Order Specific Question:   Supervising Provider    Answer:   Tania Ade H [[id number]]     Follow up prn

## 2017-05-06 ENCOUNTER — Telehealth: Payer: Self-pay | Admitting: Adult Health

## 2017-05-06 NOTE — Telephone Encounter (Signed)
Pt called stating that Anderson Malta was suppose to call her in a vaginal cream last week when she came into her appointment. Pt states that she called her pharmacy and they told her they never received it. Please contact pt

## 2017-05-06 NOTE — Telephone Encounter (Signed)
Spoke with pt letting her know I called pharmacy and gave verbal order on Premarin vaginal cream. Pt voiced understanding. [place]

## 2017-05-10 ENCOUNTER — Other Ambulatory Visit: Payer: Self-pay | Admitting: *Deleted

## 2017-05-10 MED ORDER — ESTROGENS, CONJUGATED 0.625 MG/GM VA CREA: TOPICAL_CREAM | VAGINAL | 3 refills | Status: DC

## 2017-05-22 ENCOUNTER — Telehealth: Payer: Self-pay | Admitting: Adult Health

## 2017-05-22 NOTE — Telephone Encounter (Signed)
Patient called stating that Diana Pratt has prescribed her a medication that cost 180 and patient states that she cant afford that right now, patient would like to know if Diana Pratt could give her some more samples until she gets the money to pay for her medication. Please contact pt

## 2017-05-22 NOTE — Telephone Encounter (Signed)
No samples available, ask cash price at drug store

## 2017-06-05 ENCOUNTER — Encounter: Payer: Self-pay | Admitting: Pharmacist

## 2017-06-05 ENCOUNTER — Other Ambulatory Visit: Payer: Self-pay | Admitting: Family Medicine

## 2017-06-05 ENCOUNTER — Ambulatory Visit (INDEPENDENT_AMBULATORY_CARE_PROVIDER_SITE_OTHER): Payer: Medicare Other

## 2017-06-05 ENCOUNTER — Ambulatory Visit (INDEPENDENT_AMBULATORY_CARE_PROVIDER_SITE_OTHER): Payer: Medicare Other | Admitting: Pharmacist

## 2017-06-05 VITALS — BP 110/64 | HR 68 | Ht 64.0 in | Wt 215.0 lb

## 2017-06-05 DIAGNOSIS — Z0000 Encounter for general adult medical examination without abnormal findings: Secondary | ICD-10-CM

## 2017-06-05 DIAGNOSIS — Z794 Long term (current) use of insulin: Secondary | ICD-10-CM

## 2017-06-05 DIAGNOSIS — E1165 Type 2 diabetes mellitus with hyperglycemia: Secondary | ICD-10-CM

## 2017-06-05 DIAGNOSIS — IMO0002 Reserved for concepts with insufficient information to code with codable children: Secondary | ICD-10-CM

## 2017-06-05 DIAGNOSIS — M8588 Other specified disorders of bone density and structure, other site: Secondary | ICD-10-CM

## 2017-06-05 DIAGNOSIS — M8580 Other specified disorders of bone density and structure, unspecified site: Secondary | ICD-10-CM

## 2017-06-05 MED ORDER — INSULIN GLARGINE 100 UNIT/ML SOLOSTAR PEN: 30.0000 [IU] | PEN_INJECTOR | Freq: Every day | SUBCUTANEOUS | 1 refills | Status: DC

## 2017-06-05 MED ORDER — INSULIN LISPRO 100 UNIT/ML (KWIKPEN): 8.0000 [IU] | PEN_INJECTOR | Freq: Three times a day (TID) | SUBCUTANEOUS | 1 refills | Status: DC

## 2017-06-05 NOTE — Progress Notes (Signed)
Patient ID: Diana Pratt, female   DOB: 1946-04-01, 71 y.o.   MRN: [id number]     Subjective:   Diana Pratt is a 71 y.o. female who presents for a subsequent Medicare Annual Wellness Visit.  Social History: Occupational history: retired, worked as Quarry manager and geriatric aid Marital history: Married.  Only she and her husband at home Has 1 daughter - lives in [place] Alcohol/Tobacco/Substances: none  Lantus was recently increased in July to 30 units qam and 70 units qpm.  Diana Pratt reports that BG is still elevated after lunch and evening meal.  AM 160 to 165 Pm 180's  After meals 200's Denies any episodes of hypoglycemia  Patient is in Medicare coverage gap and is concerned about cost o insulin, Symbicort, Premarin cream and Januvia.  Current Medications (verified) Outpatient Encounter Prescriptions as of 06/05/2017  Medication Sig  . ACCU-CHEK AVIVA PLUS test strip Use to check BG up to tid.  Dx: type 2 diabetes requiring insulin therapy E11.40  . ACCU-CHEK SOFTCLIX LANCETS lancets 1 each by Other route See admin instructions. Check blood sugar 3 times daily  . albuterol (PROAIR HFA) 108 (90 Base) MCG/ACT inhaler Inhale 2 puffs into the lungs every 6 (six) hours as needed for shortness of breath.  Marland Kitchen amLODipine (NORVASC) 10 MG tablet Take 10 mg by mouth daily.  Marland Kitchen aspirin 81 MG EC tablet Take 81 mg by mouth daily.    . blood glucose meter kit and supplies KIT Dispense based on patient and insurance preference. Use up to tid (for Dx: type 2 DM, with insulin therapy E11.65, Z79.4)  . budesonide-formoterol (SYMBICORT) 160-4.5 MCG/ACT inhaler Inhale 2 puffs into the lungs 2 (two) times daily.  . calcium carbonate 200 MG capsule Take 250 mg by mouth daily.   . Cholecalciferol (VITAMIN D3) 2000 UNITS capsule Take 2,000 Units by mouth daily.   . fluticasone (FLONASE) 50 MCG/ACT nasal spray Place 2 sprays into the nose daily as needed for allergies. Congestion   . hydrochlorothiazide  (HYDRODIURIL) 25 MG tablet Take 1 tablet (25 mg total) by mouth daily.  . hydroxypropyl methylcellulose (ISOPTO TEARS) 2.5 % ophthalmic solution Place 1 drop into both eyes daily as needed. for dry eyes   . ibuprofen (ADVIL,MOTRIN) 200 MG tablet Take 800 mg by mouth as needed.  . Insulin Glargine (LANTUS SOLOSTAR) 100 UNIT/ML Solostar Pen Inject 30 Units into the skin daily with breakfast. And 70 units qpm  . insulin lispro (HUMALOG) 100 UNIT/ML KiwkPen Inject 0.08-0.14 mLs (8-14 Units total) into the skin 3 (three) times daily with meals.  . Insulin Pen Needle (PEN NEEDLES) 31G X 6 MM MISC Use to administer insulin once qid. Dx E11.59 Use Leader brand  . LORazepam (ATIVAN) 1 MG tablet TAKE 1 TABLET TWICE DAILY AS NEEDED FOR ANXIETY  . metoprolol succinate (TOPROL-XL) 50 MG 24 hr tablet Take 1 tablet (50 mg total) by mouth daily. Take with or immediately following a meal.  . Multiple Vitamins-Minerals (CENTRUM SILVER PO) Take 1 tablet by mouth daily.   . ondansetron (ZOFRAN) 4 MG tablet Take 1 tablet (4 mg total) by mouth every 8 (eight) hours as needed for nausea or vomiting.  . sitaGLIPtin (JANUVIA) 100 MG tablet Take 1 tablet (100 mg total) by mouth daily.  . valsartan (DIOVAN) 320 MG tablet Take 320 mg by mouth daily.  . [DISCONTINUED] amLODipine (NORVASC) 5 MG tablet Take 5 mg by mouth daily.  . [DISCONTINUED] Insulin Glargine (LANTUS SOLOSTAR) 100 UNIT/ML Solostar  Pen Inject 68 Units into the skin daily at 10 pm. (Patient taking differently: Inject 30 Units into the skin daily with breakfast. And 70 units qpm)  . [DISCONTINUED] insulin lispro (HUMALOG) 100 UNIT/ML KiwkPen Inject 0.08-0.12 mLs (8-12 Units total) into the skin 3 (three) times daily with meals.  . [DISCONTINUED] valsartan (DIOVAN) 160 MG tablet Take 160 mg by mouth daily.  Marland Kitchen conjugated estrogens (PREMARIN) vaginal cream Use 0.53m in vagina at hs  2-3 x weekly (Patient not taking: Reported on 06/05/2017)   No facility-administered  encounter medications on file as of 06/05/2017.     Allergies (verified) Crestor [rosuvastatin]; Lipitor [atorvastatin]; Livalo [pitavastatin]; Morphine and related; Penicillins; Simvastatin; Sulfa antibiotics; and Zetia [ezetimibe]   History: Past Medical History:  Diagnosis Date  . Anxiety   . Asthma   . Back pain   . 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 COPD ([place] obstructive pulmonary disease) ([place]    dr. sKenn File . Depression   . Diabetes mellitus   . Diabetic neuropathy [place]   . GERD (gastroesophageal reflux disease)    occ heartburn  . Humerus fracture    right  . Hyperlipemia   . Hypertension   . Interstitial cystitis   . Osteoporosis   . Pneumonia    15 yrs ago  . PONV (postoperative nausea and vomiting)   . Shortness of breath dyspnea    Past Surgical History:  Procedure Laterality Date  . ABDOMINAL HYSTERECTOMY  03/1984   partial  . COLONOSCOPY N/A 07/19/2014   Procedure: COLONOSCOPY;  Surgeon: SDanie Binder MD;  [facility];  Service: Endoscopy;  Laterality: N/A;  9:30  . REVERSE SHOULDER ARTHROPLASTY Right 06/14/2016   Procedure: REVERSE SHOULDER ARTHROPLASTY;  Surgeon: KJustice Britain MD;  [facility] [facility]  Service: Orthopedics;  Laterality: Right;  . Skin cancer removed  08/07/2016   left ear   Family History  Problem Relation Age of Onset  . Parkinsonism Mother   . Dementia Mother   . Colon cancer Father 712      MULTIPLE CO-MORBIDITIES  . Cancer Father   . Diabetes Paternal Aunt   . Diabetes Paternal Uncle   . Diabetes Paternal Grandmother   . Congestive Heart Failure Paternal Grandfather   . Congestive Heart Failure Maternal Grandmother   . Stroke Maternal Grandfather   . Colon polyps Neg Hx    Social History   Occupational History  . Not on file.   Social History Main Topics  . Smoking status: Former Smoker    Packs/day: 0.50    Years: 34.00    Types: Cigarettes    Quit date:  06/01/1994  . Smokeless tobacco: Never Used  . Alcohol use No  . Drug use: No  . Sexual activity: No     Comment: hyst    Do you feel safe at home?  Yes Are there smokers in your home (other than you)? No  Dietary issues and exercise activities: Current Exercise Habits: Home exercise routine, Type of exercise: stretching (chair exercises), Time (Minutes): 10, Frequency (Times/Week): 4, Weekly Exercise (Minutes/Week): 40, Intensity: Mild  Current Dietary habits:  No following any particular diet at this time but is interested in diet to help with hyperlipidemia and diabetes.   Objective:    Today's Vitals   06/05/17 1417  BP: 110/64  Pulse: 68  Weight: 215 lb (97.5 kg)  Height: _0  (1.626 m)  PainSc: 0-No pain   Body mass index is 36.9 kg/m.   A1c was 9.3% (03/22/2017)  Activities of Daily Living In your present state of health, do you have any difficulty performing the following activities: 06/05/2017 06/06/2016  Hearing? N N  Vision? N N  Difficulty concentrating or making decisions? N N  Walking or climbing stairs? Y Y  Comment gets SOB  -  Dressing or bathing? N N  Doing errands, shopping? N -  Preparing Food and eating ? N -  Using the Toilet? N -  In the past six months, have you accidently leaked urine? N -  Do you have problems with loss of bowel control? N -  Managing your Medications? N -  Managing your Finances? N -  Housekeeping or managing your Housekeeping? N -  Some recent data might be hidden     Cardiac Risk Factors include: advanced age (>51mn, >>66women);diabetes mellitus;dyslipidemia;hypertension;obesity (BMI >30kg/m2)  Depression Screen PHQ 2/9 Scores 06/05/2017 04/10/2017 04/02/2017 03/22/2017  PHQ - 2 Score 0 2 0 0  PHQ- 9 Score - 7 - -     Fall Risk Fall Risk  06/05/2017 04/02/2017 03/22/2017 03/22/2017 10/19/2016  Falls in the past year? _0   Number falls in past yr: - - - - -  Injury with Fall? - - - - -  Risk for fall due to : - -  - - -  Follow up - - - - -    Cognitive Function: MMSE - Mini Mental State Exam 05/31/2016  Orientation to time 5  Orientation to Place 5  Registration 3  Attention/ Calculation 5  Recall 3  Language- name 2 objects 2  Language- repeat 1  Language- follow 3 step command 3  Language- read & follow direction 1  Write a sentence 1  Copy design 1  Total score 30    Immunizations and Health Maintenance Immunization History  Administered Date(s) Administered  . DTaP 01/04/2003   Health Maintenance Due  Topic Date Due  . DEXA SCAN  03/06/2011  . MAMMOGRAM  06/11/2014  . INFLUENZA VACCINE  06/05/2017    Patient Care Team: BTimmothy Euler MD as PCP - General (Family Medicine) FDanie Binder MD as Consulting Physician (Gastroenterology) LDruscilla Brownie MD as Consulting Physician (Dermatology) SJustice Britain MD as Consulting Physician (Orthopedic Surgery) BJacelyn Pi MD as Consulting Physician (Endocrinology) GEstill Dooms NP as Nurse Practitioner (Obstetrics and Gynecology)  Indicate any recent Medical Services you may have received from other than [facility] providers in the past year (date may be approximate).    Assessment:    Annual Wellness Visit    Screening Tests Health Maintenance  Topic Date Due  . DEXA SCAN  03/06/2011  . MAMMOGRAM  06/11/2014  . INFLUENZA VACCINE  06/05/2017  . TETANUS/TDAP  03/22/2018 (Originally 01/03/2013)  . PNA vac Low Risk Adult (1 of 2 - PCV13) 03/22/2018 (Originally 03/06/2011)  . HEMOGLOBIN A1C  09/22/2017  . OPHTHALMOLOGY EXAM  11/23/2017  . FOOT EXAM  04/24/2018  . COLONOSCOPY  07/19/2024  . Hepatitis C Screening  Completed        Plan:   During the course of the visit [place] educated and counseled about the following appropriate screening and preventive services:   Vaccines to include Pneumoccal, Influenza, Td and Shingles - patient refuses all vaccines  Colorectal cancer screening -  UTD  Cardiovascular disease screening  Diabetes - increase Humalog 8 units with breakfast,  14 units lunch and 14 units supper.  Bone Denisty / Osteoporosis Screening - done today  Mammogram - appt made today for November 2018  Glaucoma screening / Diabetic Eye Exam - UTD  Nutrition counseling - Discussed diet - suggested Mediterranean Diet with lots of fruits and vegetables, increase whole grains and beans, limit red meat, increase fish, limit refined sugar / sweets, limit saturated and trans fat, moderate amounts of monounsaturated fat and polyunsaturated fat and also discussed CHO limiting diet  Advanced Directives - information provided  Physical Activity  Referral sent to [facility] Patient Assistance for medication  Orders Placed This Encounter  Procedures  . Microalbumin / creatinine urine ratio     Patient Instructions (the written plan) were given to the patient.   Cherre Robins, PharmD   06/05/2017

## 2017-06-05 NOTE — Patient Instructions (Addendum)
  Ms. Diana Pratt , Thank you for taking time to come for your Medicare Wellness Visit. I appreciate your ongoing commitment to your health goals. Please review the following plan we discussed and let me know if I can assist you in the future.   These are the goals we discussed:  Increase Humalog to 8 units with breakfast, 14 units with lunch and 14 units with supper.   Try to increase walking or chair exercises - goal is 150 minutes per week  Increase non-starchy vegetables - carrots, green bean, squash, zucchini, tomatoes, onions, peppers, spinach and other green leafy vegetables, cabbage, lettuce, cucumbers, asparagus, okra (not fried), eggplant Limit sugar and processed foods (cakes, cookies, ice cream, crackers and chips) Increase fresh fruit but limit serving sizes 1/2 cup or about the size of tennis or baseball Limit red meat to no more than 1-2 times per week (serving size about the size of your palm) Choose whole grains / lean proteins - whole wheat bread, quinoa, whole grain rice (1/2 cup), fish, chicken, [place] Avoid sugar and calorie containing beverages - soda, sweet tea and juice.  Choose water or unsweetened tea instead.   This is a list of the screening recommended for you and due dates:  Health Maintenance  Topic Date Due  . DEXA scan (bone density measurement)  03/06/2011  . Mammogram  06/11/2014  . Flu Shot  06/05/2017  . Tetanus Vaccine  03/22/2018*  . Pneumonia vaccines (1 of 2 - PCV13) 03/22/2018*  . Hemoglobin A1C  09/22/2017  . Eye exam for diabetics  11/23/2017  . Complete foot exam   04/24/2018  . Colon Cancer Screening  07/19/2024  .  Hepatitis C: One time screening is recommended by Center for Disease Control  (CDC) for  adults born from [birth year] through 1965.   Completed  *Topic was postponed. The date shown is not the original due date.

## 2017-06-14 ENCOUNTER — Other Ambulatory Visit: Payer: Self-pay | Admitting: Pharmacist

## 2017-06-14 DIAGNOSIS — J45909 Unspecified asthma, uncomplicated: Secondary | ICD-10-CM

## 2017-06-14 DIAGNOSIS — J449 Chronic obstructive pulmonary disease, unspecified: Secondary | ICD-10-CM

## 2017-06-14 MED ORDER — BUDESONIDE-FORMOTEROL FUMARATE 160-4.5 MCG/ACT IN AERO: 2.0000 | INHALATION_SPRAY | Freq: Two times a day (BID) | RESPIRATORY_TRACT | 1 refills | Status: DC

## 2017-06-14 MED ORDER — INSULIN LISPRO 100 UNIT/ML (KWIKPEN): 8.0000 [IU] | PEN_INJECTOR | Freq: Three times a day (TID) | SUBCUTANEOUS | 1 refills | Status: DC

## 2017-06-14 MED ORDER — INSULIN GLARGINE 100 UNIT/ML SOLOSTAR PEN: 30.0000 [IU] | PEN_INJECTOR | Freq: Every day | SUBCUTANEOUS | 1 refills | Status: DC

## 2017-06-14 MED ORDER — SITAGLIPTIN PHOSPHATE 100 MG PO TABS: 100.0000 mg | ORAL_TABLET | Freq: Every day | ORAL | 1 refills | Status: DC

## 2017-06-14 NOTE — Telephone Encounter (Signed)
Rx's printed for PAP programed - pt referred to [facility] Department PAP

## 2017-07-26 ENCOUNTER — Encounter: Payer: Self-pay | Admitting: Family Medicine

## 2017-07-26 ENCOUNTER — Ambulatory Visit (INDEPENDENT_AMBULATORY_CARE_PROVIDER_SITE_OTHER): Payer: Medicare Other | Admitting: Family Medicine

## 2017-07-26 VITALS — BP 161/78 | HR 97 | Temp 96.9°F | Ht 64.0 in | Wt 215.8 lb

## 2017-07-26 DIAGNOSIS — F419 Anxiety disorder, unspecified: Secondary | ICD-10-CM | POA: Insufficient documentation

## 2017-07-26 DIAGNOSIS — R300 Dysuria: Secondary | ICD-10-CM | POA: Diagnosis not present

## 2017-07-26 DIAGNOSIS — M545 Low back pain, unspecified: Secondary | ICD-10-CM

## 2017-07-26 DIAGNOSIS — J0100 Acute maxillary sinusitis, unspecified: Secondary | ICD-10-CM | POA: Diagnosis not present

## 2017-07-26 LAB — URINALYSIS, COMPLETE
BILIRUBIN UA: NEGATIVE
KETONES UA: NEGATIVE
Leukocytes, UA: NEGATIVE
Nitrite, UA: NEGATIVE
Protein, UA: NEGATIVE
RBC, UA: NEGATIVE
SPEC GRAV UA: 1.025 (ref 1.005–1.030)
UUROB: 0.2 mg/dL (ref 0.2–1.0)
pH, UA: 5 (ref 5.0–7.5)

## 2017-07-26 LAB — CMP14+EGFR
ALT: 97 IU/L — AB (ref 0–32)
AST: 82 IU/L — ABNORMAL HIGH (ref 0–40)
Albumin/Globulin Ratio: 1.5 (ref 1.2–2.2)
Albumin: 4.1 g/dL (ref 3.5–4.8)
Alkaline Phosphatase: 76 IU/L (ref 39–117)
BUN/Creatinine Ratio: 19 (ref 12–28)
BUN: 16 mg/dL (ref 8–27)
Bilirubin Total: 0.3 mg/dL (ref 0.0–1.2)
CO2: 22 mmol/L (ref 20–29)
CREATININE: 0.84 mg/dL (ref 0.57–1.00)
Calcium: 9.1 mg/dL (ref 8.7–10.3)
Chloride: 101 mmol/L (ref 96–106)
GFR, EST AFRICAN AMERICAN: 81 mL/min/{1.73_m2} (ref >59)
GFR, EST NON AFRICAN AMERICAN: 70 mL/min/{1.73_m2} (ref >59)
GLOBULIN, TOTAL: 2.8 g/dL (ref 1.5–4.5)
Glucose: 251 mg/dL — ABNORMAL HIGH (ref 65–99)
Potassium: 3.9 mmol/L (ref 3.5–5.2)
Sodium: 139 mmol/L (ref 134–144)
Total Protein: 6.9 g/dL (ref 6.0–8.5)

## 2017-07-26 LAB — CBC WITH DIFFERENTIAL/PLATELET
BASOS: 1 %
Basophils Absolute: 0 10*3/uL (ref 0.0–0.2)
EOS (ABSOLUTE): 0.1 10*3/uL (ref 0.0–0.4)
Eos: 2 %
HEMOGLOBIN: 13.6 g/dL (ref 11.1–15.9)
Hematocrit: 41.5 % (ref 34.0–46.6)
IMMATURE GRANS (ABS): 0 10*3/uL (ref 0.0–0.1)
Immature Granulocytes: 0 %
LYMPHS: 38 %
Lymphocytes Absolute: 2 10*3/uL (ref 0.7–3.1)
MCH: 30.7 pg (ref 26.6–33.0)
MCHC: 32.8 g/dL (ref 31.5–35.7)
MCV: 94 fL (ref 79–97)
MONOCYTES: 8 %
Monocytes Absolute: 0.4 10*3/uL (ref 0.1–0.9)
NEUTROS ABS: 2.7 10*3/uL (ref 1.4–7.0)
Neutrophils: 51 %
Platelets: 165 10*3/uL (ref 150–379)
RBC: 4.43 x10E6/uL (ref 3.77–5.28)
RDW: 13.8 % (ref 12.3–15.4)
WBC: 5.2 10*3/uL (ref 3.4–10.8)

## 2017-07-26 LAB — MICROSCOPIC EXAMINATION: Renal Epithel, UA: NONE SEEN /hpf

## 2017-07-26 MED ORDER — CEFDINIR 300 MG PO CAPS: 300.0000 mg | ORAL_CAPSULE | Freq: Two times a day (BID) | ORAL | 0 refills | Status: DC

## 2017-07-26 MED ORDER — LORAZEPAM 1 MG PO TABS: ORAL_TABLET | ORAL | 2 refills | Status: DC

## 2017-07-26 NOTE — Progress Notes (Signed)
   HPI  Patient presents today here for follow-up of chronic medical conditions with some acute complaints.  Dysuria 1 week, no fever, chills, sweats.  Diabetes Patient has established with endocrinology, deferring care to them. She is now taking 30 units of Lantus a.m. and 70 units p.m. Has difficulty affording Januvia, samples were given today  Low back pain Left-sided low back pain, worse for 2 weeks, has happened before. No leg symptoms No leg weakness, numbness, tingling.  Anxiety Needs refill of Ativan, takes 1-2 times daily.  PMH: Smoking status noted ROS: Per HPI  Objective: BP (!) 161/78   Pulse 97   Temp (!) 96.9 F (36.1 C) (Oral)   Ht _0  (1.626 m)   Wt 215 lb 12.8 oz (97.9 kg)   BMI 37.04 kg/m  Gen: NAD, alert, cooperative with exam HEENT: NCAT, EOMI, PERRL CV: RRR, good S1/S2, no murmur Resp: CTABL, no wheezes, non-labored Abd: SNTND, BS present, no guarding or organomegaly Ext: No edema, warm Neuro: Alert and oriented, Strength 5/5 in BL LE  Assessment and plan:  # Anxiety Stable Refilled Ativan  # Dysuria Urinalysis is reassuring No red flags Treating sinusitis with Omnicef which would cover urine well also.   # Acute sinusitis Treat with Omnicef  # Left-sided low back pain without sciatica Discussed the patient, no red flags She would not like to pursue any additional prescription medications at this time.  T2DM- defer to endo, pt is established and has had doses adjusted, f/u in 1 month  Orders Placed This Encounter  Procedures  . Urinalysis, Complete  . CMP14+EGFR  . CBC with Differential/Platelet    Meds ordered this encounter  Medications  . cefdinir (OMNICEF) 300 MG capsule    Sig: Take 1 capsule (300 mg total) by mouth 2 (two) times daily. 1 po BID    Dispense:  20 capsule    Refill:  0  . LORazepam (ATIVAN) 1 MG tablet    Sig: TAKE 1 TABLET TWICE DAILY AS NEEDED FOR ANXIETY    Dispense:  60 tablet    Refill:  2     Laroy Apple, MD [facility] 07/26/2017, 10:30 AM

## 2017-07-26 NOTE — Patient Instructions (Signed)
Great to see you!  Come back in 4 months unless you need [place] sooner.    Sinusitis, Adult Sinusitis is soreness and inflammation of your sinuses. Sinuses are hollow spaces in the bones around your face. They are located:  Around your eyes.  In the middle of your forehead.  Behind your nose.  In your cheekbones.  Your sinuses and nasal passages are lined with a stringy fluid (mucus). Mucus normally drains out of your sinuses. When your nasal tissues get inflamed or swollen, the mucus can get trapped or blocked so air cannot flow through your sinuses. This lets bacteria, viruses, and funguses grow, and that leads to infection. Follow these instructions at home: Medicines  Take, use, or apply over-the-counter and prescription medicines only as told by your doctor. These may include nasal sprays.  If you were prescribed an antibiotic medicine, take it as told by your doctor. Do not stop taking the antibiotic even if you start to feel better. Hydrate and Humidify  Drink enough water to keep your pee (urine) clear or pale yellow.  Use a cool mist humidifier to keep the humidity level in your home above 50%.  Breathe in steam for 10-15 minutes, 3-4 times a day or as told by your doctor. You can do this in the bathroom while a hot shower is running.  Try not to spend time in cool or dry air. Rest  Rest as much as possible.  Sleep with your head raised (elevated).  Make sure to get enough sleep each night. General instructions  Put a warm, moist washcloth on your face 3-4 times a day or as told by your doctor. This will help with discomfort.  Wash your hands often with soap and water. If there is no soap and water, use hand sanitizer.  Do not smoke. Avoid being around people who are smoking (secondhand smoke).  Keep all follow-up visits as told by your doctor. This is important. Contact a doctor if:  You have a fever.  Your symptoms get worse.  Your symptoms do not get  better within 10 days. Get help right away if:  You have a very bad headache.  You cannot stop throwing up (vomiting).  You have pain or swelling around your face or eyes.  You have trouble seeing.  You feel confused.  Your neck is stiff.  You have trouble breathing. This information is not intended to replace advice given to you by your health care provider. Make sure you discuss any questions you have with your health care provider. Document Released: 04/09/2008 Document Revised: 06/17/2016 Document Reviewed: 08/17/2015 Elsevier Interactive Patient Education  [employer].

## 2017-09-30 ENCOUNTER — Other Ambulatory Visit: Payer: Self-pay | Admitting: Family Medicine

## 2017-09-30 NOTE — Telephone Encounter (Signed)
OV 10/25/17

## 2017-10-25 ENCOUNTER — Ambulatory Visit: Payer: Medicare Other | Admitting: Family Medicine

## 2017-11-08 ENCOUNTER — Ambulatory Visit (INDEPENDENT_AMBULATORY_CARE_PROVIDER_SITE_OTHER): Payer: Medicare Other

## 2017-11-08 ENCOUNTER — Ambulatory Visit (INDEPENDENT_AMBULATORY_CARE_PROVIDER_SITE_OTHER): Payer: Medicare Other | Admitting: Family Medicine

## 2017-11-08 ENCOUNTER — Encounter: Payer: Self-pay | Admitting: Family Medicine

## 2017-11-08 VITALS — BP 153/74 | HR 79 | Temp 96.6°F | Ht 64.0 in | Wt 218.0 lb

## 2017-11-08 DIAGNOSIS — M545 Low back pain, unspecified: Secondary | ICD-10-CM

## 2017-11-08 DIAGNOSIS — F418 Other specified anxiety disorders: Secondary | ICD-10-CM

## 2017-11-08 DIAGNOSIS — E1165 Type 2 diabetes mellitus with hyperglycemia: Secondary | ICD-10-CM | POA: Diagnosis not present

## 2017-11-08 DIAGNOSIS — Z794 Long term (current) use of insulin: Secondary | ICD-10-CM | POA: Diagnosis not present

## 2017-11-08 DIAGNOSIS — IMO0002 Reserved for concepts with insufficient information to code with codable children: Secondary | ICD-10-CM

## 2017-11-08 DIAGNOSIS — F419 Anxiety disorder, unspecified: Secondary | ICD-10-CM

## 2017-11-08 LAB — BAYER DCA HB A1C WAIVED: HB A1C: 8.6 % — AB (ref <7.0)

## 2017-11-08 MED ORDER — LORAZEPAM 1 MG PO TABS: ORAL_TABLET | ORAL | 2 refills | Status: DC

## 2017-11-08 MED ORDER — DULOXETINE HCL 60 MG PO CPEP: 60.0000 mg | ORAL_CAPSULE | Freq: Every day | ORAL | 3 refills | Status: DC

## 2017-11-08 MED ORDER — DULOXETINE HCL 30 MG PO CPEP: 30.0000 mg | ORAL_CAPSULE | Freq: Every day | ORAL | 0 refills | Status: DC

## 2017-11-08 NOTE — Patient Instructions (Signed)
Greta to see you!  Come back in 3 -4 weeks to follow up for back pain and your mood.   Start Cymbalta 30 mg once daily, in 2 weeks increase to 60 mg once daily.  Increase your Humalog to 16 units per meal, do not take this if you do not eat a meal.  I referred you to endocrinology, you will receive a call when your appointment is arranged, this could take approximately 2 weeks

## 2017-11-08 NOTE — Progress Notes (Signed)
   HPI  Patient presents today here to follow-up for chronic medical conditions.  Anxiety Doing well, however having more depressive symptoms with multiple deaths in the family recently, would like to restart Lexapro. No SI.  Low back pain Right-sided low back pain for a few months, no injury. No sciatica, worsening with leaning forward to wash dishes. Ibuprofen helps.  Type 2 diabetes. Patient states that he did not mesh well with her previous endocrinologist, she would like to try another, she is taking her Lantus and Humalog as prescribed. Average fasting blood sugar of 150, average postprandial around 180s-200 Hypoglycemia, can describe previous hypoglycemic events very well  PMH: Smoking status noted ROS: Per HPI  Objective: BP (!) 153/74   Pulse 79   Temp (!) 96.6 F (35.9 C) (Oral)   Ht 5\' 4"  (1.626 m)   Wt 218 lb (98.9 kg)   BMI 37.42 kg/m  Gen: NAD, alert, cooperative with exam HEENT: NCAT CV: RRR, good S1/S2, no murmur Resp: CTABL, no wheezes, non-labored Ext: No edema, warm Neuro: Alert and oriented, No gross deficits  Assessment and plan:  #Type 2 diabetes Uncontrolled, however improved slightly Titrate mealtime insulin to 16 units per meal, continue current basal insulin dose Continue Januvia Refer to endocrinology  #Right-sided low back pain Plain film, trial of Cymbalta  #Anxiety and depression. Refill Ativan, starting Cymbalta 30 mg x 2 weeks then 60 mg daily Follow-up 3-4 weeks   Orders Placed This Encounter  Procedures  . DG Lumbar Spine 2-3 Views    Standing Status:   Future    Number of Occurrences:   1    Standing Expiration Date:   01/08/2019    Order Specific Question:   Reason for Exam (SYMPTOM  OR DIAGNOSIS REQUIRED)    Answer:   R low back pain, TTP over lumbar spine    Order Specific Question:   Preferred imaging location?    Answer:   Internal  . Bayer DCA Hb A1c Waived  . Ambulatory referral to Endocrinology    Referral  Priority:   Routine    Referral Type:   Consultation    Referral Reason:   Specialty Services Required    Number of Visits Requested:   1    Meds ordered this encounter  Medications  . DULoxetine (CYMBALTA) 30 MG capsule    Sig: Take 1 capsule (30 mg total) by mouth daily.    Dispense:  14 capsule    Refill:  0  . DULoxetine (CYMBALTA) 60 MG capsule    Sig: Take 1 capsule (60 mg total) by mouth daily. Start after taking 30 mg once daily for 2 weeks    Dispense:  30 capsule    Refill:  3  . LORazepam (ATIVAN) 1 MG tablet    Sig: TAKE 1 TABLET TWICE DAILY AS NEEDED FOR ANXIETY    Dispense:  60 tablet    Refill:  2    Laroy Apple, MD [facility] 11/08/2017, 5:13 PM

## 2017-11-13 DIAGNOSIS — E1159 Type 2 diabetes mellitus with other circulatory complications: Secondary | ICD-10-CM | POA: Diagnosis not present

## 2017-11-13 DIAGNOSIS — E785 Hyperlipidemia, unspecified: Secondary | ICD-10-CM | POA: Diagnosis not present

## 2017-11-13 DIAGNOSIS — E1169 Type 2 diabetes mellitus with other specified complication: Secondary | ICD-10-CM | POA: Diagnosis not present

## 2017-11-13 DIAGNOSIS — E1140 Type 2 diabetes mellitus with diabetic neuropathy, unspecified: Secondary | ICD-10-CM | POA: Diagnosis not present

## 2017-11-13 DIAGNOSIS — I10 Essential (primary) hypertension: Secondary | ICD-10-CM | POA: Diagnosis not present

## 2017-11-13 DIAGNOSIS — Z794 Long term (current) use of insulin: Secondary | ICD-10-CM | POA: Diagnosis not present

## 2017-11-13 DIAGNOSIS — E1165 Type 2 diabetes mellitus with hyperglycemia: Secondary | ICD-10-CM | POA: Diagnosis not present

## 2017-12-09 ENCOUNTER — Ambulatory Visit (INDEPENDENT_AMBULATORY_CARE_PROVIDER_SITE_OTHER): Payer: Medicare Other | Admitting: Family Medicine

## 2017-12-09 ENCOUNTER — Encounter: Payer: Self-pay | Admitting: Family Medicine

## 2017-12-09 VITALS — BP 150/85 | HR 88 | Temp 97.0°F | Ht 64.0 in | Wt 216.6 lb

## 2017-12-09 DIAGNOSIS — F419 Anxiety disorder, unspecified: Secondary | ICD-10-CM | POA: Diagnosis not present

## 2017-12-09 NOTE — Progress Notes (Signed)
   HPI  Patient presents today for anxiety and depression.  Patient states that she feels better, she is tolerating 60 mg of Cymbalta well.  She has had slightly elevated blood sugars lately, however she has made some changes with endocrinology.  She is very satisfied with Dr. Hartford Poli, endocrinology.  PMH: Smoking status noted ROS: Per HPI  Objective: BP (!) 150/85   Pulse 88   Temp (!) 97 F (36.1 C) (Oral)   Ht 5\' 4"  (1.626 m)   Wt 216 lb 9.6 oz (98.2 kg)   BMI 37.18 kg/m  Gen: NAD, alert, cooperative with exam HEENT: NCAT CV: RRR, good S1/S2, no murmur Resp: CTABL, no wheezes, non-labored Ext: No edema, warm Neuro: Alert and oriented, No gross deficits  Assessment and plan:  # Anxiety With depresion, doing well with cymbalta Patient also on Ativan, continue     Laroy Apple, MD [facility] 12/09/2017, 9:34 AM

## 2017-12-13 ENCOUNTER — Other Ambulatory Visit: Payer: Self-pay | Admitting: Family Medicine

## 2017-12-16 ENCOUNTER — Other Ambulatory Visit: Payer: Self-pay | Admitting: Family Medicine

## 2017-12-16 DIAGNOSIS — I10 Essential (primary) hypertension: Secondary | ICD-10-CM

## 2017-12-23 LAB — HM DIABETES EYE EXAM

## 2017-12-26 DIAGNOSIS — Z794 Long term (current) use of insulin: Secondary | ICD-10-CM | POA: Diagnosis not present

## 2017-12-26 DIAGNOSIS — E1169 Type 2 diabetes mellitus with other specified complication: Secondary | ICD-10-CM | POA: Diagnosis not present

## 2017-12-26 DIAGNOSIS — E1165 Type 2 diabetes mellitus with hyperglycemia: Secondary | ICD-10-CM | POA: Diagnosis not present

## 2017-12-26 DIAGNOSIS — E1140 Type 2 diabetes mellitus with diabetic neuropathy, unspecified: Secondary | ICD-10-CM | POA: Diagnosis not present

## 2017-12-26 DIAGNOSIS — E1159 Type 2 diabetes mellitus with other circulatory complications: Secondary | ICD-10-CM | POA: Diagnosis not present

## 2017-12-26 DIAGNOSIS — E785 Hyperlipidemia, unspecified: Secondary | ICD-10-CM | POA: Diagnosis not present

## 2017-12-26 DIAGNOSIS — Z1231 Encounter for screening mammogram for malignant neoplasm of breast: Secondary | ICD-10-CM | POA: Diagnosis not present

## 2017-12-26 DIAGNOSIS — I10 Essential (primary) hypertension: Secondary | ICD-10-CM | POA: Diagnosis not present

## 2018-01-13 DIAGNOSIS — E1165 Type 2 diabetes mellitus with hyperglycemia: Secondary | ICD-10-CM | POA: Diagnosis not present

## 2018-01-13 DIAGNOSIS — Z713 Dietary counseling and surveillance: Secondary | ICD-10-CM | POA: Diagnosis not present

## 2018-01-20 ENCOUNTER — Other Ambulatory Visit: Payer: Self-pay | Admitting: Family Medicine

## 2018-02-20 ENCOUNTER — Other Ambulatory Visit: Payer: Self-pay | Admitting: Family Medicine

## 2018-03-14 ENCOUNTER — Encounter: Payer: Self-pay | Admitting: Family Medicine

## 2018-03-14 ENCOUNTER — Ambulatory Visit (INDEPENDENT_AMBULATORY_CARE_PROVIDER_SITE_OTHER): Payer: Medicare Other | Admitting: Family Medicine

## 2018-03-14 VITALS — BP 165/82 | HR 94 | Temp 97.2°F | Ht 64.0 in | Wt 215.0 lb

## 2018-03-14 DIAGNOSIS — F419 Anxiety disorder, unspecified: Secondary | ICD-10-CM | POA: Diagnosis not present

## 2018-03-14 DIAGNOSIS — M79604 Pain in right leg: Secondary | ICD-10-CM | POA: Diagnosis not present

## 2018-03-14 DIAGNOSIS — IMO0002 Reserved for concepts with insufficient information to code with codable children: Secondary | ICD-10-CM

## 2018-03-14 DIAGNOSIS — I10 Essential (primary) hypertension: Secondary | ICD-10-CM | POA: Diagnosis not present

## 2018-03-14 DIAGNOSIS — E1165 Type 2 diabetes mellitus with hyperglycemia: Secondary | ICD-10-CM | POA: Diagnosis not present

## 2018-03-14 DIAGNOSIS — Z794 Long term (current) use of insulin: Secondary | ICD-10-CM | POA: Diagnosis not present

## 2018-03-14 DIAGNOSIS — M79605 Pain in left leg: Secondary | ICD-10-CM

## 2018-03-14 LAB — BAYER DCA HB A1C WAIVED: HB A1C: 8.1 % — AB (ref <7.0)

## 2018-03-14 MED ORDER — GABAPENTIN 300 MG PO CAPS: 300.0000 mg | ORAL_CAPSULE | Freq: Three times a day (TID) | ORAL | 2 refills | Status: DC

## 2018-03-14 MED ORDER — METOPROLOL SUCCINATE ER 100 MG PO TB24: 100.0000 mg | ORAL_TABLET | Freq: Every day | ORAL | 3 refills | Status: DC

## 2018-03-14 MED ORDER — LORAZEPAM 1 MG PO TABS: 1.0000 mg | ORAL_TABLET | Freq: Two times a day (BID) | ORAL | 2 refills | Status: DC | PRN

## 2018-03-14 NOTE — Progress Notes (Signed)
   HPI  Patient presents today here for follow-up chronic medical conditions.  Patient states that she cannot continue to see endocrinology due to difficulty getting to the office. She has stopped Januvia since last seeing [place], she is still taking 50 units of Lantus twice daily, 16 units of Humalog 3 times daily with meals. No hypoglycemia Average fasting blood sugars 1 60-1 80.  Hypertension Good medication compliance and tolerance, states blood pressure at home at its best is 680H systolic.  Anxiety Doing well with Ativan, needs refill  She also complains of bilateral thigh pain and cramps with burning type pain intermittently, mostly in the morning when waking up.   PMH: Smoking status noted ROS: Per HPI  Objective: BP (!) 165/82   Pulse 94   Temp (!) 97.2 F (36.2 C) (Oral)   Ht '5\' 4"'$  (1.626 m)   Wt 215 lb (97.5 kg)   SpO2 95%   BMI 36.90 kg/m  Gen: NAD, alert, cooperative with exam HEENT: NCAT CV: RRR, good S1/S2, no murmur Resp: CTABL, no wheezes, non-labored Ext: No edema, warm Neuro: Alert and oriented, No gross deficits  Assessment and plan:  #Uncontrolled type 2 diabetes Still uncontrolled, A1c improved from 8.6-8.1. Continue current Lantus 50 units twice daily, Humalog 16 units 3 times daily She was started on GLP by endocrinology which she did not tolerate.  Consider Invokana, however has its own risks Consider slight titration in insulin Discussed diet today. Wide variability is the biggest challenge at this time I believe  #Hypertension Uncontrolled, heart rate is consistently above 70, titrate beta-blocker first. Continue ARB, HCTZ, amlodipine   #Anxiety Stable, refilled Ativan, attempted to get her on Cymbalta, however she has discontinued this.  #Bilateral leg pain Patient complaining of thigh pain in bilateral legs, she does have some chronic lumbar pain.  I think this is most likely related etiology. Starting gabapentin, this pain is not  consistent with diabetic neuropathy    Orders Placed This Encounter  Procedures  . Bayer DCA Hb A1c Waived  . Microalbumin / creatinine urine ratio  . CMP14+EGFR  . CBC with Differential/Platelet  . Lipid panel    Meds ordered this encounter  Medications  . metoprolol succinate (TOPROL-XL) 100 MG 24 hr tablet    Sig: Take 1 tablet (100 mg total) by mouth daily. Take with or immediately following a meal.    Dispense:  90 tablet    Refill:  3  . gabapentin (NEURONTIN) 300 MG capsule    Sig: Take 1 capsule (300 mg total) by mouth 3 (three) times daily.    Dispense:  90 capsule    Refill:  2  . LORazepam (ATIVAN) 1 MG tablet    Sig: Take 1 tablet (1 mg total) by mouth 2 (two) times daily as needed for anxiety.    Dispense:  60 tablet    Refill:  [place], MD [facility] 03/14/2018, 9:11 AM

## 2018-03-14 NOTE — Patient Instructions (Addendum)
Great to see you!  I have increased her metoprolol to 100 mg once daily, I have sent a new prescription in.  I have started you on gabapentin to help with pain, start with 1 pill daily at night for about a week, then increase to twice daily, you may take it up to 3 times daily.  However it may make you drowsy.

## 2018-03-15 LAB — CBC WITH DIFFERENTIAL/PLATELET
Basophils Absolute: 0 10*3/uL (ref 0.0–0.2)
Basos: 0 %
EOS (ABSOLUTE): 0.1 10*3/uL (ref 0.0–0.4)
EOS: 2 %
HEMATOCRIT: 43.6 % (ref 34.0–46.6)
HEMOGLOBIN: 14.7 g/dL (ref 11.1–15.9)
IMMATURE GRANS (ABS): 0 10*3/uL (ref 0.0–0.1)
Immature Granulocytes: 0 %
LYMPHS ABS: 3.2 10*3/uL — AB (ref 0.7–3.1)
LYMPHS: 47 %
MCH: 30.9 pg (ref 26.6–33.0)
MCHC: 33.7 g/dL (ref 31.5–35.7)
MCV: 92 fL (ref 79–97)
MONOCYTES: 6 %
Monocytes Absolute: 0.4 10*3/uL (ref 0.1–0.9)
NEUTROS ABS: 3.1 10*3/uL (ref 1.4–7.0)
Neutrophils: 45 %
Platelets: 189 10*3/uL (ref 150–379)
RBC: 4.75 x10E6/uL (ref 3.77–5.28)
RDW: 13.6 % (ref 12.3–15.4)
WBC: 6.9 10*3/uL (ref 3.4–10.8)

## 2018-03-15 LAB — CMP14+EGFR
ALK PHOS: 92 IU/L (ref 39–117)
ALT: 76 IU/L — AB (ref 0–32)
AST: 74 IU/L — ABNORMAL HIGH (ref 0–40)
Albumin/Globulin Ratio: 1.4 (ref 1.2–2.2)
Albumin: 3.9 g/dL (ref 3.5–4.8)
BILIRUBIN TOTAL: 0.3 mg/dL (ref 0.0–1.2)
BUN / CREAT RATIO: 22 (ref 12–28)
BUN: 16 mg/dL (ref 8–27)
CHLORIDE: 102 mmol/L (ref 96–106)
CO2: 20 mmol/L (ref 20–29)
CREATININE: 0.72 mg/dL (ref 0.57–1.00)
Calcium: 9.2 mg/dL (ref 8.7–10.3)
GFR, EST AFRICAN AMERICAN: 97 mL/min/{1.73_m2} (ref >59)
GFR, EST NON AFRICAN AMERICAN: 84 mL/min/{1.73_m2} (ref >59)
GLUCOSE: 151 mg/dL — AB (ref 65–99)
Globulin, Total: 2.7 g/dL (ref 1.5–4.5)
Potassium: 3.9 mmol/L (ref 3.5–5.2)
Sodium: 142 mmol/L (ref 134–144)
Total Protein: 6.6 g/dL (ref 6.0–8.5)

## 2018-03-15 LAB — LIPID PANEL
CHOL/HDL RATIO: 7.4 ratio — AB (ref 0.0–4.4)
Cholesterol, Total: 228 mg/dL — ABNORMAL HIGH (ref 100–199)
HDL: 31 mg/dL — AB (ref >39)
LDL Calculated: 154 mg/dL — ABNORMAL HIGH (ref 0–99)
TRIGLYCERIDES: 217 mg/dL — AB (ref 0–149)
VLDL Cholesterol Cal: 43 mg/dL — ABNORMAL HIGH (ref 5–40)

## 2018-03-17 ENCOUNTER — Other Ambulatory Visit: Payer: Self-pay | Admitting: Family Medicine

## 2018-03-24 ENCOUNTER — Encounter: Payer: Self-pay | Admitting: *Deleted

## 2018-06-12 ENCOUNTER — Ambulatory Visit: Payer: Medicare Other | Admitting: *Deleted

## 2018-06-30 ENCOUNTER — Encounter: Payer: Self-pay | Admitting: Family Medicine

## 2018-06-30 ENCOUNTER — Ambulatory Visit (INDEPENDENT_AMBULATORY_CARE_PROVIDER_SITE_OTHER): Payer: Medicare Other | Admitting: Family Medicine

## 2018-06-30 VITALS — BP 162/67 | HR 92 | Temp 97.8°F | Ht 64.0 in | Wt 214.4 lb

## 2018-06-30 DIAGNOSIS — F418 Other specified anxiety disorders: Secondary | ICD-10-CM

## 2018-06-30 DIAGNOSIS — I10 Essential (primary) hypertension: Secondary | ICD-10-CM

## 2018-06-30 DIAGNOSIS — Z794 Long term (current) use of insulin: Secondary | ICD-10-CM

## 2018-06-30 DIAGNOSIS — IMO0002 Reserved for concepts with insufficient information to code with codable children: Secondary | ICD-10-CM

## 2018-06-30 DIAGNOSIS — Z23 Encounter for immunization: Secondary | ICD-10-CM | POA: Diagnosis not present

## 2018-06-30 DIAGNOSIS — E1165 Type 2 diabetes mellitus with hyperglycemia: Secondary | ICD-10-CM

## 2018-06-30 DIAGNOSIS — N3001 Acute cystitis with hematuria: Secondary | ICD-10-CM | POA: Diagnosis not present

## 2018-06-30 DIAGNOSIS — R350 Frequency of micturition: Secondary | ICD-10-CM | POA: Diagnosis not present

## 2018-06-30 LAB — URINALYSIS, COMPLETE
BILIRUBIN UA: NEGATIVE
Glucose, UA: NEGATIVE
KETONES UA: NEGATIVE
NITRITE UA: POSITIVE — AB
Protein, UA: NEGATIVE
SPEC GRAV UA: 1.015 (ref 1.005–1.030)
Urobilinogen, Ur: 0.2 mg/dL (ref 0.2–1.0)
pH, UA: 5 (ref 5.0–7.5)

## 2018-06-30 LAB — MICROSCOPIC EXAMINATION: Renal Epithel, UA: NONE SEEN /hpf

## 2018-06-30 LAB — BAYER DCA HB A1C WAIVED: HB A1C (BAYER DCA - WAIVED): 8.9 % — ABNORMAL HIGH (ref <7.0)

## 2018-06-30 MED ORDER — LORAZEPAM 1 MG PO TABS: 1.0000 mg | ORAL_TABLET | Freq: Two times a day (BID) | ORAL | 2 refills | Status: DC | PRN

## 2018-06-30 MED ORDER — FLUOXETINE HCL 10 MG PO CAPS: 10.0000 mg | ORAL_CAPSULE | Freq: Every day | ORAL | 1 refills | Status: DC

## 2018-06-30 MED ORDER — CEPHALEXIN 500 MG PO CAPS: 500.0000 mg | ORAL_CAPSULE | Freq: Two times a day (BID) | ORAL | 0 refills | Status: AC

## 2018-06-30 NOTE — Patient Instructions (Signed)
   I have refilled your lorazepam.  I have also prescribed you fluoxetine 10 mg.  You will take 1 capsule daily.  Start this tomorrow since we are starting the antibiotic today.  See back in 6 weeks.   Your urine did demonstrate evidence of infection.  I have prescribed you cephalexin to take twice a day for the next 7 days.  Taking the medicine as directed and not missing any doses is one of the best things you can do to treat your anxiety.  Here are some things to keep in mind:  1) Side effects (stomach upset, some increased anxiety) may happen before you notice a benefit.  These side effects typically go away over time. 2) Changes to your dose of medicine or a change in medication all together is sometimes necessary 3) Most people need to be on medication at least 12 months 4) Many people will notice an improvement within two weeks but the full effect of the medication can take up to 4-6 weeks 5) Stopping the medication when you start feeling better often results in a return of symptoms 6) Never discontinue your medication without contacting a health care professional first.  Some medications require gradual discontinuation/ taper and can make you sick if you stop them abruptly.  If your symptoms worsen or you have thoughts of suicide/homicide, PLEASE SEEK IMMEDIATE MEDICAL ATTENTION.  You may always call:  National Suicide Hotline: 667-395-0160 [place]: (704)192-2158 Crisis Recovery in [place]: (217)418-0983   These are available 24 hours a day, 7 days a week.   Urinary Tract Infection, Adult A urinary tract infection (UTI) is an infection of any part of the urinary tract. The urinary tract includes the:  Kidneys.  Ureters.  Bladder.  Urethra.  These organs make, store, and get rid of pee (urine) in the body. Follow these instructions at home:  Take over-the-counter and prescription medicines only as told by your doctor.  If you were prescribed an  antibiotic medicine, take it as told by your doctor. Do not stop taking the antibiotic even if you start to feel better.  Avoid the following drinks: ? Alcohol. ? Caffeine. ? Tea. ? Carbonated drinks.  Drink enough fluid to keep your pee clear or pale yellow.  Keep all follow-up visits as told by your doctor. This is important.  Make sure to: ? Empty your bladder often and completely. Do not to hold pee for long periods of time. ? Empty your bladder before and after sex. ? Wipe from front to back after a bowel movement if you are female. Use each tissue one time when you wipe. Contact a doctor if:  You have back pain.  You have a fever.  You feel sick to your stomach (nauseous).  You throw up (vomit).  Your symptoms do not get better after 3 days.  Your symptoms go away and then come back. Get help right away if:  You have very bad back pain.  You have very bad lower belly (abdominal) pain.  You are throwing up and cannot keep down any medicines or water. This information is not intended to replace advice given to you by your health care provider. Make sure you discuss any questions you have with your health care provider. Document Released: 04/09/2008 Document Revised: 03/29/2016 Document Reviewed: 09/12/2015 Elsevier Interactive Patient Education  [employer].

## 2018-06-30 NOTE — Progress Notes (Signed)
Subjective: CC: DM2 PCP: Janora Norlander, DO MLY:YTKPTW R Wolfson is a 72 y.o. female presenting to clinic today for:  1. Type 2 Diabetes:  Patient reports fasting blood sugar has been running between 130 and 150s.  She notes that she was seeing the endocrinologist but stopped seeing them because there was inconsistency his in her directions for insulin regimen.  She is taking medication(s): Lantus 50 units twice daily and Humalog 16 units 3 times daily. Side effects: none.  She is not on a statin because of history of intolerance to statins, myalgia.  Last eye exam: 11/2017, sees Dr Rona Ravens w/ [facility] Last foot exam: due Last A1c: 8.1 in 03/2018 Nephropathy screen indicated?: on ARB Last flu, zoster and/or pneumovax: needs PNA  ROS: denies fever, chills, dizziness, LOC, polyuria, polydipsia, unintended weight loss/gain, foot ulcerations, numbness or tingling in extremities or chest pain.  2. GAD Patient reports a long-standing history of anxiety disorder.  She notes that the Cymbalta actually gave her headaches so she discontinued medication.  She has been on Ativan for many years now.  Denies any excessive sedation, shortness of breath or dizziness.  She does not drink alcohol.  Never hospitalized for mental health disorder.  No SI, HI, visual or auditory hallucinations.  3. HTN Patient reports that she has not taken her blood pressure medications today.  She otherwise reports compliance with her medicines.  Denies any chest pain, shortness of breath, visual disturbance or lower extremity edema.  4.  Urinary symptoms Patient reports dysuria and urinary frequency since last Tuesday.  She denies any hematuria, fevers, chills, nausea or vomiting.  She does note that her blood sugars have been up a little bit since she has been having the symptoms.  ROS: Per HPI  Allergies  Allergen Reactions  . Crestor [Rosuvastatin]     Myalgias   . Lipitor [Atorvastatin] Other (See Comments)   Myalgias   . Livalo [Pitavastatin] Other (See Comments)    myalgias  . Morphine And Related Other (See Comments)    Burning, itching  . Penicillins Hives and Itching  . Simvastatin Other (See Comments)    myalgias  . Sulfa Antibiotics Hives and Itching  . Zetia [Ezetimibe] Cough   Past Medical History:  Diagnosis Date  . Anxiety   . Asthma   . Back pain   . 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 COPD (chronic obstructive pulmonary disease) ([place])    dr. Kenn File  . Depression   . Diabetes mellitus   . Diabetic neuropathy ([place])   . GERD (gastroesophageal reflux disease)    occ heartburn  . Humerus fracture    right  . Hyperlipemia   . Hypertension   . Interstitial cystitis   . Osteoporosis   . Pneumonia    15 yrs ago  . PONV (postoperative nausea and vomiting)   . Shortness of breath dyspnea     Current Outpatient Medications:  .  ACCU-CHEK AVIVA PLUS test strip, Use to check BG up to tid.  Dx: type 2 diabetes requiring insulin therapy E11.40, Disp: 300 each, Rfl: 1 .  ACCU-CHEK SOFTCLIX LANCETS lancets, 1 each by Other route See admin instructions. Check blood sugar 3 times daily, Disp: , Rfl:  .  albuterol (PROAIR HFA) 108 (90 Base) MCG/ACT inhaler, Inhale 2 puffs into the lungs every 6 (six) hours as needed for shortness of breath., Disp: 18 g, Rfl: 1 .  amLODipine (NORVASC) 10 MG tablet, TAKE 1 TABLET ONCE A DAY, Disp: 90 tablet, Rfl: 0 .  aspirin 81 MG EC tablet, Take 81 mg by mouth daily.  , Disp: , Rfl:  .  blood glucose meter kit and supplies KIT, Dispense based on patient and insurance preference. Use up to tid (for Dx: type 2 DM, with insulin therapy E11.65, Z79.4), Disp: 1 each, Rfl: 0 .  budesonide-formoterol (SYMBICORT) 160-4.5 MCG/ACT inhaler, Inhale 2 puffs into the lungs 2 (two) times daily., Disp: 3 Inhaler, Rfl: 1 .  calcium carbonate 200 MG capsule, Take 250 mg by mouth daily. , Disp: , Rfl:  .  Cholecalciferol  (VITAMIN D3) 2000 UNITS capsule, Take 2,000 Units by mouth daily. , Disp: 60 capsule, Rfl: 11 .  fluticasone (FLONASE) 50 MCG/ACT nasal spray, Place 2 sprays into the nose daily as needed for allergies. Congestion , Disp: , Rfl:  .  gabapentin (NEURONTIN) 300 MG capsule, Take 1 capsule (300 mg total) by mouth 3 (three) times daily., Disp: 90 capsule, Rfl: 2 .  HUMALOG KWIKPEN 100 UNIT/ML KiwkPen, INJECT UP TO 12 UNITS SQ 3 TIMES A DAY WITH MEALS, Disp: 15 mL, Rfl: 2 .  hydrochlorothiazide (HYDRODIURIL) 25 MG tablet, Take 1 tablet (25 mg total) by mouth daily., Disp: 90 tablet, Rfl: 0 .  hydroxypropyl methylcellulose (ISOPTO TEARS) 2.5 % ophthalmic solution, Place 1 drop into both eyes daily as needed. for dry eyes , Disp: , Rfl:  .  ibuprofen (ADVIL,MOTRIN) 200 MG tablet, Take 800 mg by mouth as needed., Disp: , Rfl:  .  Insulin Glargine (LANTUS SOLOSTAR) 100 UNIT/ML Solostar Pen, Inject 30 Units into the skin daily with breakfast. And 70 units qpm (Patient taking differently: Inject 40 Units into the skin daily with breakfast. And 70 units qpm), Disp: 90 mL, Rfl: 1 .  Insulin Pen Needle (PEN NEEDLES) 31G X 6 MM MISC, Use to administer insulin once qid. Dx E11.59 Use Leader brand, Disp: 100 each, Rfl: 1 .  LORazepam (ATIVAN) 1 MG tablet, Take 1 tablet (1 mg total) by mouth 2 (two) times daily as needed for anxiety., Disp: 60 tablet, Rfl: 2 .  metoprolol succinate (TOPROL-XL) 100 MG 24 hr tablet, Take 1 tablet (100 mg total) by mouth daily. Take with or immediately following a meal., Disp: 90 tablet, Rfl: 3 .  Multiple Vitamins-Minerals (CENTRUM SILVER PO), Take 1 tablet by mouth daily. , Disp: , Rfl:  .  ondansetron (ZOFRAN) 4 MG tablet, Take 1 tablet (4 mg total) by mouth every 8 (eight) hours as needed for nausea or vomiting., Disp: 20 tablet, Rfl: 0 .  sitaGLIPtin (JANUVIA) 100 MG tablet, Take 100 mg by mouth daily., Disp: , Rfl:  .  valsartan (DIOVAN) 320 MG tablet, TAKE 1 TABLET ONCE DAILY, Disp:  90 tablet, Rfl: 1 Social History   Socioeconomic History  . Marital status: Married    Spouse name: Not on file  . Number of children: Not on file  . Years of education: Not on file  . Highest education level: Not on file  Occupational History  . Not on file  Social Needs  . Financial resource strain: Not on file  . Food insecurity:    Worry: Not on file    Inability: Not on file  . Transportation needs:    Medical: Not on file    Non-medical: Not on file  Tobacco Use  . Smoking status: Former Smoker    Packs/day: 0.50    Years:  34.00    Pack years: 17.00    Types: Cigarettes    Last attempt to quit: 06/01/1994    Years since quitting: 24.0  . Smokeless tobacco: Never Used  Substance and Sexual Activity  . Alcohol use: No  . Drug use: No  . Sexual activity: Never    Birth control/protection: Surgical    Comment: hyst  Lifestyle  . Physical activity:    Days per week: Not on file    Minutes per session: Not on file  . Stress: Not on file  Relationships  . Social connections:    Talks on phone: Not on file    Gets together: Not on file    Attends religious service: Not on file    Active member of club or organization: Not on file    Attends meetings of clubs or organizations: Not on file    Relationship status: Not on file  . Intimate partner violence:    Fear of current or ex partner: Not on file    Emotionally abused: Not on file    Physically abused: Not on file    Forced sexual activity: Not on file  Other Topics Concern  . Not on file  Social History Narrative   RETIRED: WORKS AS A CNA AT [employer]   1 DAUGHTER-IN [place]   O GRANDKIDS   DRINKS SOCIALLY   Family History  Problem Relation Age of Onset  . Parkinsonism Mother   . Dementia Mother   . Colon cancer Father 77       MULTIPLE CO-MORBIDITIES  . Cancer Father   . Diabetes Paternal Aunt   . Diabetes Paternal Uncle   . Diabetes Paternal Grandmother   . Congestive Heart Failure  Paternal Grandfather   . Congestive Heart Failure Maternal Grandmother   . Stroke Maternal Grandfather   . Colon polyps Neg Hx     Objective: Office vital signs reviewed. BP (!) 162/67   Pulse 92   Temp 97.8 F (36.6 C) (Oral)   Ht '5\' 4"'$  (1.626 m)   Wt 214 lb 6.4 oz (97.3 kg)   BMI 36.80 kg/m   Physical Examination:  General: Awake, alert, well nourished, No acute distress Cardio: regular rate and rhythm, S1S2 heard, no murmurs appreciated Pulm: clear to auscultation bilaterally, no wheezes, rhonchi or rales; normal work of breathing on room air Extremities: warm, well perfused, No edema, cyanosis or clubbing; +2 pulses bilaterally MSK: normal gait and normal station Skin: dry; intact; no rashes or lesions Psych: Mood stable, speech normal, eye contact fair, does not appear to be responding to internal stimuli.  Depression screen [facility] 2/9 06/30/2018 03/14/2018 12/09/2017  Decreased Interest 0 0 0  Down, Depressed, Hopeless 0 0 0  PHQ - 2 Score 0 0 0  Altered sleeping - - -  Tired, decreased energy - - -  Change in appetite - - -  Feeling bad or failure about yourself  - - -  Trouble concentrating - - -  Moving slowly or fidgety/restless - - -  Suicidal thoughts - - -  PHQ-9 Score - - -  Difficult doing work/chores - - -  Some recent data might be hidden    GAD 7 : Generalized Anxiety Score 06/30/2018  Nervous, Anxious, on Edge 1  Control/stop worrying 1  Worry too much - different things 1  Trouble relaxing 1  Restless 0  Easily annoyed or irritable 1  Afraid - awful might happen 0  Total GAD  7 Score 5  Anxiety Difficulty Not difficult at all   Assessment/ Plan: 72 y.o. female   1. Uncontrolled type 2 diabetes mellitus with insulin therapy ([place]) A1c has risen quite a bit since last check to 8.9%.  I recommend that patient increase her total Lantus by 1 unit every 3 days for fasting blood sugars greater than 120.  She will bring in a blood sugar log at next visit.   We will plan for diabetic foot exam at next visit.  She had her pneumonia vaccine updated today. - Bayer DCA Hb A1c Waived  2. Essential hypertension Blood pressure not controlled today but patient has not taken her medications.  I did recommend that she monitor blood pressures closely at home.  Goal BP less than 140/90.  She will take her medications prior to arrival at next visit.  She will also bring in all of her medications to next visit.  3. Acute cystitis with hematuria Urinalysis with evidence of UTI.  I sent this for culture.  Start Keflex p.o. twice daily for the next 7 days.  She is tolerated cephalosporins without difficulty in the past. - Urinalysis, Complete - Urine Culture  4. Depression with anxiety Given her consistent use of lorazepam, she would benefit from a controller medication.  She had headaches with Cymbalta.  I placed her on Prozac 10 mg daily.  Instructions for use discussed with the patient.  She will follow-up in 6 weeks or sooner if needed.  Her lorazepam was refilled today.  Controlled substance contract signed today.   Orders Placed This Encounter  Procedures  . Urine Culture  . Pneumococcal polysaccharide vaccine 23-valent greater than or equal to 2yo subcutaneous/IM  . Bayer DCA Hb A1c Waived  . Urinalysis, Complete   Meds ordered this encounter  Medications  . FLUoxetine (PROZAC) 10 MG capsule    Sig: Take 1 capsule (10 mg total) by mouth daily.    Dispense:  30 capsule    Refill:  1  . LORazepam (ATIVAN) 1 MG tablet    Sig: Take 1 tablet (1 mg total) by mouth 2 (two) times daily as needed for anxiety.    Dispense:  60 tablet    Refill:  2  . cephALEXin (KEFLEX) 500 MG capsule    Sig: Take 1 capsule (500 mg total) by mouth 2 (two) times daily for 7 days.    Dispense:  14 capsule    Refill:  0    The Narcotic Database has been reviewed.  There were no red flags.    Janora Norlander, DO [place] [phone]

## 2018-07-04 LAB — URINE CULTURE

## 2018-07-15 ENCOUNTER — Ambulatory Visit: Payer: Medicare Other | Admitting: Family Medicine

## 2018-07-16 ENCOUNTER — Ambulatory Visit: Payer: Medicare Other | Admitting: Family Medicine

## 2018-08-11 ENCOUNTER — Encounter: Payer: Self-pay | Admitting: Family Medicine

## 2018-08-11 ENCOUNTER — Ambulatory Visit (INDEPENDENT_AMBULATORY_CARE_PROVIDER_SITE_OTHER): Payer: Medicare Other | Admitting: Family Medicine

## 2018-08-11 VITALS — BP 150/80 | HR 71 | Temp 97.3°F | Ht 64.0 in | Wt 220.0 lb

## 2018-08-11 DIAGNOSIS — Z794 Long term (current) use of insulin: Secondary | ICD-10-CM

## 2018-08-11 DIAGNOSIS — G8929 Other chronic pain: Secondary | ICD-10-CM | POA: Diagnosis not present

## 2018-08-11 DIAGNOSIS — M5442 Lumbago with sciatica, left side: Secondary | ICD-10-CM | POA: Diagnosis not present

## 2018-08-11 DIAGNOSIS — Z789 Other specified health status: Secondary | ICD-10-CM

## 2018-08-11 DIAGNOSIS — IMO0002 Reserved for concepts with insufficient information to code with codable children: Secondary | ICD-10-CM

## 2018-08-11 DIAGNOSIS — I10 Essential (primary) hypertension: Secondary | ICD-10-CM | POA: Diagnosis not present

## 2018-08-11 DIAGNOSIS — T466X5A Adverse effect of antihyperlipidemic and antiarteriosclerotic drugs, initial encounter: Secondary | ICD-10-CM | POA: Insufficient documentation

## 2018-08-11 DIAGNOSIS — F419 Anxiety disorder, unspecified: Secondary | ICD-10-CM | POA: Diagnosis not present

## 2018-08-11 DIAGNOSIS — E1165 Type 2 diabetes mellitus with hyperglycemia: Secondary | ICD-10-CM

## 2018-08-11 MED ORDER — INSULIN GLARGINE 100 UNIT/ML SOLOSTAR PEN: PEN_INJECTOR | SUBCUTANEOUS | 1 refills | Status: DC

## 2018-08-11 MED ORDER — METHYLPREDNISOLONE ACETATE 40 MG/ML IJ SUSP
40.0000 mg | Freq: Once | INTRAMUSCULAR | Status: AC
  Administered 2018-08-11: 40 mg via INTRAMUSCULAR

## 2018-08-11 MED ORDER — FLUOXETINE HCL 10 MG PO CAPS: 10.0000 mg | ORAL_CAPSULE | ORAL | 1 refills | Status: DC

## 2018-08-11 MED ORDER — HYDROCHLOROTHIAZIDE 25 MG PO TABS: 25.0000 mg | ORAL_TABLET | Freq: Every day | ORAL | 3 refills | Status: DC

## 2018-08-11 NOTE — Patient Instructions (Signed)
Sciatica Sciatica is pain, numbness, weakness, or tingling along your sciatic nerve. The sciatic nerve starts in the lower back and goes down the back of each leg. Sciatica happens when this nerve is pinched or has pressure put on it. Sciatica usually goes away on its own or with treatment. Sometimes, sciatica may keep coming back (recur). Follow these instructions at home: Medicines  Take over-the-counter and prescription medicines only as told by your doctor.  Do not drive or use heavy machinery while taking prescription pain medicine. Managing pain  If directed, put ice on the affected area. ? Put ice in a plastic bag. ? Place a towel between your skin and the bag. ? Leave the ice on for 20 minutes, 2-3 times a day.  After icing, apply heat to the affected area before you exercise or as often as told by your doctor. Use the heat source that your doctor tells you to use, such as a moist heat pack or a heating pad. ? Place a towel between your skin and the heat source. ? Leave the heat on for 20-30 minutes. ? Remove the heat if your skin turns bright red. This is especially important if you are unable to feel pain, heat, or cold. You may have a greater risk of getting burned. Activity  Return to your normal activities as told by your doctor. Ask your doctor what activities are safe for you. ? Avoid activities that make your sciatica worse.  Take short rests during the day. Rest in a lying or standing position. This is usually better than sitting to rest. ? When you rest for a long time, do some physical activity or stretching between periods of rest. ? Avoid sitting for a long time without moving. Get up and move around at least one time each hour.  Exercise and stretch regularly, as told by your doctor.  Do not lift anything that is heavier than 10 lb (4.5 kg) while you have symptoms of sciatica. ? Avoid lifting heavy things even when you do not have symptoms. ? Avoid lifting heavy  things over and over.  When you lift objects, always lift in a way that is safe for your body. To do this, you should: ? Bend your knees. ? Keep the object close to your body. ? Avoid twisting. General instructions  Use good posture. ? Avoid leaning forward when you are sitting. ? Avoid hunching over when you are standing.  Stay at a healthy weight.  Wear comfortable shoes that support your feet. Avoid wearing high heels.  Avoid sleeping on a mattress that is too soft or too hard. You might have less pain if you sleep on a mattress that is firm enough to support your back.  Keep all follow-up visits as told by your doctor. This is important. Contact a doctor if:  You have pain that: ? Wakes you up when you are sleeping. ? Gets worse when you lie down. ? Is worse than the pain you have had in the past. ? Lasts longer than 4 weeks.  You lose weight for without trying. Get help right away if:  You cannot control when you pee (urinate) or poop (have a bowel movement).  You have weakness in any of these areas and it gets worse. ? Lower back. ? Lower belly (pelvis). ? Butt (buttocks). ? Legs.  You have redness or swelling of your back.  You have a burning feeling when you pee. This information is not intended to replace   advice given to you by your health care provider. Make sure you discuss any questions you have with your health care provider. Document Released: 07/31/2008 Document Revised: 03/29/2016 Document Reviewed: 07/01/2015 Elsevier Interactive Patient Education  2018 [employer] Back Pain, Adult Back pain is very common. The pain often gets better over time. The cause of back pain is usually not dangerous. Most people can learn to manage their back pain on their own. Follow these instructions at home: Watch your back pain for any changes. The following actions may help to lessen any pain you are feeling:  Stay active. Start with short walks on flat ground if you  can. Try to walk farther each day.  Exercise regularly as told by your doctor. Exercise helps your back heal faster. It also helps avoid future injury by keeping your muscles strong and flexible.  Do not sit, drive, or stand in one place for more than 30 minutes.  Do not stay in bed. Resting more than 1-2 days can slow down your recovery.  Be careful when you bend or lift an object. Use good form when lifting: ? Bend at your knees. ? Keep the object close to your body. ? Do not twist.  Sleep on a firm mattress. Lie on your side, and bend your knees. If you lie on your back, put a pillow under your knees.  Take medicines only as told by your doctor.  Put ice on the injured area. ? Put ice in a plastic bag. ? Place a towel between your skin and the bag. ? Leave the ice on for 20 minutes, 2-3 times a day for the first 2-3 days. After that, you can switch between ice and heat packs.  Avoid feeling anxious or stressed. Find good ways to deal with stress, such as exercise.  Maintain a healthy weight. Extra weight puts stress on your back.  Contact a doctor if:  You have pain that does not go away with rest or medicine.  You have worsening pain that goes down into your legs or buttocks.  You have pain that does not get better in one week.  You have pain at night.  You lose weight.  You have a fever or chills. Get help right away if:  You cannot control when you poop (bowel movement) or pee (urinate).  Your arms or legs feel weak.  Your arms or legs lose feeling (numbness).  You feel sick to your stomach (nauseous) or throw up (vomit).  You have belly (abdominal) pain.  You feel like you may pass out (faint). This information is not intended to replace advice given to you by your health care provider. Make sure you discuss any questions you have with your health care provider. Document Released: 04/09/2008 Document Revised: 03/29/2016 Document Reviewed:  02/23/2014 Elsevier Interactive Patient Education  2018 [employer] Inc[employer]  

## 2018-08-11 NOTE — Progress Notes (Signed)
Subjective: CC: GAD,  PCP: Janora Norlander, DO MVE:[facility] R Draughn is a 72 y.o. female presenting to clinic today for:  1. GAD/ Depression History: Long-standing history of anxiety disorder.  Intolerant to Cymbalta in the past secondary to headaches.  Has been on Ativan for many years.  No history of hospitalization for mental health disorder.  Patient was started on Prozac 10 mg daily at last visit given regular need for Ativan.  She reports that the Prozac 10 mg seem to make her a little sleepy and therefore she spaced out to every other day.  She reports good tolerance of this regimen and feels that her depression and anxiety are under better control with this.  In fact, control has been so much better that she is only using the Ativan once daily instead of twice daily.  She is sleeping better.  No SI or HI.  2.  Low back pain Patient reports low back pain.  She reports the left buttock and states that the pain will radiate down her left lower extremity.  Denies any falls or preceding injury.  She has had history of chronic back pain in the past and actually had x-rays performed in January of this year which demonstrated mild degenerative changes in the lumbar spine.  She states that the pain seems to be worse with standing and relieved by sitting.  She is used Aleve which did help as well.  In the past, she was seen by [place] for her shoulder.  3.  Hypertension Patient reports compliance with antihypertensive regimen.  She needs refills on her hydrochlorothiazide.  Denies any chest pain, shortness of breath or lower extremity edema.  4.  Type 2 diabetes Patient reports she is now taking 50 units in the morning and 53 units of Lantus in the evening.  She is taking 16 units of Humalog with each meal.  Fasting blood sugars are running between 140 and 150.  She has not seen the dietitian again since the summer.  She ran out of her Lantus recently and has been using her  husband supply.  She needs a refill.   ROS: Per HPI  Allergies  Allergen Reactions  . Crestor [Rosuvastatin]     Myalgias   . Lipitor [Atorvastatin] Other (See Comments)    Myalgias   . Livalo [Pitavastatin] Other (See Comments)    myalgias  . Morphine And Related Other (See Comments)    Burning, itching  . Penicillins Hives and Itching  . Simvastatin Other (See Comments)    myalgias  . Sulfa Antibiotics Hives and Itching  . Zetia [Ezetimibe] Cough   Past Medical History:  Diagnosis Date  . Anxiety   . Asthma   . Back pain   . 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 COPD (chronic obstructive pulmonary disease) ([place])    dr. Kenn File  . Depression   . Diabetes mellitus   . Diabetic neuropathy ([place])   . GERD (gastroesophageal reflux disease)    occ heartburn  . Humerus fracture    right  . Hyperlipemia   . Hypertension   . Interstitial cystitis   . Osteoporosis   . Pneumonia    15 yrs ago  . PONV (postoperative nausea and vomiting)   . Shortness of breath dyspnea     Current Outpatient Medications:  .  ACCU-CHEK AVIVA PLUS test strip, Use to check BG up to tid.  Dx:  type 2 diabetes requiring insulin therapy E11.40, Disp: 300 each, Rfl: 1 .  ACCU-CHEK SOFTCLIX LANCETS lancets, 1 each by Other route See admin instructions. Check blood sugar 3 times daily, Disp: , Rfl:  .  albuterol (PROAIR HFA) 108 (90 Base) MCG/ACT inhaler, Inhale 2 puffs into the lungs every 6 (six) hours as needed for shortness of breath., Disp: 18 g, Rfl: 1 .  amLODipine (NORVASC) 10 MG tablet, TAKE 1 TABLET ONCE A DAY, Disp: 90 tablet, Rfl: 0 .  aspirin 81 MG EC tablet, Take 81 mg by mouth daily.  , Disp: , Rfl:  .  blood glucose meter kit and supplies KIT, Dispense based on patient and insurance preference. Use up to tid (for Dx: type 2 DM, with insulin therapy E11.65, Z79.4), Disp: 1 each, Rfl: 0 .  budesonide-formoterol (SYMBICORT) 160-4.5 MCG/ACT  inhaler, Inhale 2 puffs into the lungs 2 (two) times daily., Disp: 3 Inhaler, Rfl: 1 .  calcium carbonate 200 MG capsule, Take 250 mg by mouth daily. , Disp: , Rfl:  .  Cholecalciferol (VITAMIN D3) 2000 UNITS capsule, Take 2,000 Units by mouth daily. , Disp: 60 capsule, Rfl: 11 .  FLUoxetine (PROZAC) 10 MG capsule, Take 1 capsule (10 mg total) by mouth daily., Disp: 30 capsule, Rfl: 1 .  fluticasone (FLONASE) 50 MCG/ACT nasal spray, Place 2 sprays into the nose daily as needed for allergies. Congestion , Disp: , Rfl:  .  gabapentin (NEURONTIN) 300 MG capsule, Take 1 capsule (300 mg total) by mouth 3 (three) times daily., Disp: 90 capsule, Rfl: 2 .  HUMALOG KWIKPEN 100 UNIT/ML KiwkPen, INJECT UP TO 12 UNITS SQ 3 TIMES A DAY WITH MEALS, Disp: 15 mL, Rfl: 2 .  hydrochlorothiazide (HYDRODIURIL) 25 MG tablet, Take 1 tablet (25 mg total) by mouth daily., Disp: 90 tablet, Rfl: 0 .  hydroxypropyl methylcellulose (ISOPTO TEARS) 2.5 % ophthalmic solution, Place 1 drop into both eyes daily as needed. for dry eyes , Disp: , Rfl:  .  ibuprofen (ADVIL,MOTRIN) 200 MG tablet, Take 800 mg by mouth as needed., Disp: , Rfl:  .  Insulin Glargine (LANTUS SOLOSTAR) 100 UNIT/ML Solostar Pen, Inject 30 Units into the skin daily with breakfast. And 70 units qpm (Patient taking differently: Inject 40 Units into the skin daily with breakfast. And 70 units qpm), Disp: 90 mL, Rfl: 1 .  Insulin Pen Needle (PEN NEEDLES) 31G X 6 MM MISC, Use to administer insulin once qid. Dx E11.59 Use Leader brand, Disp: 100 each, Rfl: 1 .  LORazepam (ATIVAN) 1 MG tablet, Take 1 tablet (1 mg total) by mouth 2 (two) times daily as needed for anxiety., Disp: 60 tablet, Rfl: 2 .  metoprolol succinate (TOPROL-XL) 100 MG 24 hr tablet, Take 1 tablet (100 mg total) by mouth daily. Take with or immediately following a meal., Disp: 90 tablet, Rfl: 3 .  Multiple Vitamins-Minerals (CENTRUM SILVER PO), Take 1 tablet by mouth daily. , Disp: , Rfl:  .   ondansetron (ZOFRAN) 4 MG tablet, Take 1 tablet (4 mg total) by mouth every 8 (eight) hours as needed for nausea or vomiting., Disp: 20 tablet, Rfl: 0 .  sitaGLIPtin (JANUVIA) 100 MG tablet, Take 100 mg by mouth daily., Disp: , Rfl:  .  valsartan (DIOVAN) 320 MG tablet, TAKE 1 TABLET ONCE DAILY, Disp: 90 tablet, Rfl: 1 Social History   Socioeconomic History  . Marital status: Married    Spouse name: Not on file  . Number of children: Not on  file  . Years of education: Not on file  . Highest education level: Not on file  Occupational History  . Not on file  Social Needs  . Financial resource strain: Not on file  . Food insecurity:    Worry: Not on file    Inability: Not on file  . Transportation needs:    Medical: Not on file    Non-medical: Not on file  Tobacco Use  . Smoking status: Former Smoker    Packs/day: 0.50    Years: 34.00    Pack years: 17.00    Types: Cigarettes    Last attempt to quit: 06/01/1994    Years since quitting: 24.2  . Smokeless tobacco: Never Used  Substance and Sexual Activity  . Alcohol use: No  . Drug use: No  . Sexual activity: Never    Birth control/protection: Surgical    Comment: hyst  Lifestyle  . Physical activity:    Days per week: Not on file    Minutes per session: Not on file  . Stress: Not on file  Relationships  . Social connections:    Talks on phone: Not on file    Gets together: Not on file    Attends religious service: Not on file    Active member of club or organization: Not on file    Attends meetings of clubs or organizations: Not on file    Relationship status: Not on file  . Intimate partner violence:    Fear of current or ex partner: Not on file    Emotionally abused: Not on file    Physically abused: Not on file    Forced sexual activity: Not on file  Other Topics Concern  . Not on file  Social History Narrative   RETIRED: WORKS AS A CNA AT [employer]   1 DAUGHTER-IN [place]   O GRANDKIDS   DRINKS  SOCIALLY   Family History  Problem Relation Age of Onset  . Parkinsonism Mother   . Dementia Mother   . Colon cancer Father 24       MULTIPLE CO-MORBIDITIES  . Cancer Father   . Diabetes Paternal Aunt   . Diabetes Paternal Uncle   . Diabetes Paternal Grandmother   . Congestive Heart Failure Paternal Grandfather   . Congestive Heart Failure Maternal Grandmother   . Stroke Maternal Grandfather   . Colon polyps Neg Hx     Objective: Office vital signs reviewed. BP (!) 150/80   Pulse 71   Temp (!) 97.3 F (36.3 C) (Oral)   Ht '5\' 4"'  (1.626 m)   Wt 220 lb (99.8 kg)   BMI 37.76 kg/m   Physical Examination:  General: Awake, alert, central obesity, No acute distress HEENT: sclera white, MMM Cardio: regular rate and rhythm, P5T6 heard, systolic murmur appreciated at the RSB Pulm: clear to auscultation bilaterally, no wheezes, rhonchi or rales; normal work of breathing on room air Extremities: warm, well perfused, No edema, cyanosis or clubbing; +2 pulses bilaterally MSK: normal gait and station  Lumbar spine: Patient has full active range of motion all planes.  Some discomfort noted with extension.  No midline tenderness to palpation.  No paraspinal muscle tenderness palpation.  No palpable bony abnormalities.  Left hip: No tenderness to palpation over the trochanteric bursa. Neuro: 5/5 Strength and light touch sensation grossly intact  Depression screen [facility] 2/9 08/11/2018 06/30/2018 03/14/2018  Decreased Interest 0 0 0  Down, Depressed, Hopeless 0 0 0  PHQ - 2  Score 0 0 0  Altered sleeping - - -  Tired, decreased energy - - -  Change in appetite - - -  Feeling bad or failure about yourself  - - -  Trouble concentrating - - -  Moving slowly or fidgety/restless - - -  Suicidal thoughts - - -  PHQ-9 Score - - -  Difficult doing work/chores - - -  Some recent data might be hidden   GAD 7 : Generalized Anxiety Score 08/11/2018 06/30/2018  Nervous, Anxious, on Edge 1 1    Control/stop worrying 0 1  Worry too much - different things 0 1  Trouble relaxing 1 1  Restless 0 0  Easily annoyed or irritable 1 1  Afraid - awful might happen 0 0  Total GAD 7 Score 3 5  Anxiety Difficulty Somewhat difficult Not difficult at all    Assessment/ Plan: 72 y.o. female   1. Anxiety Gad 7 score slightly improved since last visit.  She is actually started taking less of the Ativan which seems to be reassuring.  We discussed that if she finds that the Prozac dosed at every other day is helpful then she can continue this regimen.  I have changed to the [place] to reflect the new instructions.  Use Ativan very sparingly and only as needed.  No refills are needed at this time.  2. Essential hypertension Fluctuating quite a bit during exam today.  Manual was 150/80.  Has not taken all of her BP medications.  I will recommend that she return for in office BP check once she has taken everything as prescribed.  Adjustment of medication pending BP recheck.  3. Statin intolerance Has tried and failed multiple statins in the past and Zetia.  4. Chronic left-sided low back pain with left-sided sciatica Acute on chronic flare.  I reviewed her imaging results from January of this year which did demonstrate degenerative changes within the L5 on S1 segments.  She wanted to pursue corticosteroid injection today and therefore she was given a dose intramuscularly.  We discussed that this may impact her blood sugars that she should monitor the very closely.  If her symptoms are persistent, we will plan for referral to [place] for further evaluation and management. - methylPREDNISolone acetate (DEPO-MEDROL) injection 40 mg  5. Uncontrolled type 2 diabetes mellitus with insulin therapy ([place]) We reviewed increasing Lantus by 1 unit q3 days for FBG >120.  She will follow up in 6 weeks for recheck A1c.  Lantus refills sent.    Meds ordered this encounter  Medications  .  methylPREDNISolone acetate (DEPO-MEDROL) injection 40 mg  . FLUoxetine (PROZAC) 10 MG capsule    Sig: Take 1 capsule (10 mg total) by mouth every other day.    Dispense:  45 capsule    Refill:  1  . hydrochlorothiazide (HYDRODIURIL) 25 MG tablet    Sig: Take 1 tablet (25 mg total) by mouth daily.    Dispense:  90 tablet    Refill:  3  . DISCONTD: Insulin Glargine (LANTUS SOLOSTAR) 100 UNIT/ML Solostar Pen    Sig: Inject 50 units every morning and 54 units every evening    Dispense:  90 mL    Refill:  1    PAP Rx  . Insulin Glargine (LANTUS SOLOSTAR) 100 UNIT/ML Solostar Pen    Sig: Inject 50 units every morning and 54 units every evening    Dispense:  90 mL    Refill:  1  PAP Rx     Janora Norlander, [place] [phone]

## 2018-08-18 ENCOUNTER — Other Ambulatory Visit: Payer: Self-pay

## 2018-08-18 NOTE — Patient Outreach (Signed)
[facility]) Care Management  08/18/2018  Diana Pratt Mar 07, 1946 [id number]   Medication Adherence call to Diana Pratt spoke with patient she still has medication and does not need any at this time patient is due on Valsartan 320 mg under [place].   [place] Management Direct Dial [phone]  Fax [phone] [name]

## 2018-08-29 NOTE — Progress Notes (Signed)
Pt wants to wait until her follow up appt on Nov. 4th

## 2018-09-08 ENCOUNTER — Ambulatory Visit (INDEPENDENT_AMBULATORY_CARE_PROVIDER_SITE_OTHER): Payer: Medicare Other | Admitting: Family Medicine

## 2018-09-08 ENCOUNTER — Encounter: Payer: Self-pay | Admitting: Family Medicine

## 2018-09-08 VITALS — BP 148/80 | HR 80 | Temp 97.8°F | Ht 64.0 in | Wt 221.0 lb

## 2018-09-08 DIAGNOSIS — I10 Essential (primary) hypertension: Secondary | ICD-10-CM

## 2018-09-08 DIAGNOSIS — F418 Other specified anxiety disorders: Secondary | ICD-10-CM | POA: Diagnosis not present

## 2018-09-08 DIAGNOSIS — Z794 Long term (current) use of insulin: Secondary | ICD-10-CM

## 2018-09-08 DIAGNOSIS — E1159 Type 2 diabetes mellitus with other circulatory complications: Secondary | ICD-10-CM | POA: Diagnosis not present

## 2018-09-08 DIAGNOSIS — E119 Type 2 diabetes mellitus without complications: Secondary | ICD-10-CM

## 2018-09-08 DIAGNOSIS — E1165 Type 2 diabetes mellitus with hyperglycemia: Secondary | ICD-10-CM | POA: Diagnosis not present

## 2018-09-08 LAB — BAYER DCA HB A1C WAIVED: HB A1C: 6.9 % (ref <7.0)

## 2018-09-08 MED ORDER — LORAZEPAM 1 MG PO TABS: 1.0000 mg | ORAL_TABLET | Freq: Two times a day (BID) | ORAL | 2 refills | Status: DC | PRN

## 2018-09-08 NOTE — Progress Notes (Signed)
Subjective: CC: GAD, HTN, DM2 PCP: Janora Norlander, DO HGD:JMEQAS R Stoffel is a 72 y.o. female presenting to clinic today for:  1. GAD/ Depression History: Long-standing history of anxiety disorder.  Intolerant to Cymbalta in the past secondary to headaches.  Has been on Ativan for many years.  No history of hospitalization for mental health disorder.  Patient that she continues to do well on Prozac 10 mg every other day.  She continues to only need Ativan 21m once daily.  Denies any excessive sedation, difficulty with breathing or falls.  Sleep is good.  2.  Type 2 diabetes w/ Hypertension Patient reports she is taking 50 units in the morning and 54 units of Lantus in the evening.  She is taking 16 units of Humalog with each meal.  Fasting blood sugars are running 130-140s.  She had a 1 low since last visit that was 560  She notes that this occurred after lunch.  She thinks she did not eat enough lunch with her Humalog insulin.  Patient reports compliance with HCTZ.  Denies any chest pain, shortness of breath or lower extremity edema.  ROS: Per HPI  Allergies  Allergen Reactions  . Crestor [Rosuvastatin]     Myalgias   . Lipitor [Atorvastatin] Other (See Comments)    Myalgias   . Livalo [Pitavastatin] Other (See Comments)    myalgias  . Morphine And Related Other (See Comments)    Burning, itching  . Penicillins Hives and Itching  . Simvastatin Other (See Comments)    myalgias  . Sulfa Antibiotics Hives and Itching  . Zetia [Ezetimibe] Cough   Past Medical History:  Diagnosis Date  . Anxiety   . Asthma   . Back pain   . 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 COPD (chronic obstructive pulmonary disease) ([place]    dr. sKenn File . Depression   . Diabetes mellitus   . Diabetic neuropathy ([place]   . GERD (gastroesophageal reflux disease)    occ heartburn  . Humerus fracture    right  . Hyperlipemia   . Hypertension   .  Interstitial cystitis   . Osteoporosis   . Pneumonia    15 yrs ago  . PONV (postoperative nausea and vomiting)   . Shortness of breath dyspnea     Current Outpatient Medications:  .  ACCU-CHEK AVIVA PLUS test strip, Use to check BG up to tid.  Dx: type 2 diabetes requiring insulin therapy E11.40, Disp: 300 each, Rfl: 1 .  ACCU-CHEK SOFTCLIX LANCETS lancets, 1 each by Other route See admin instructions. Check blood sugar 3 times daily, Disp: , Rfl:  .  albuterol (PROAIR HFA) 108 (90 Base) MCG/ACT inhaler, Inhale 2 puffs into the lungs every 6 (six) hours as needed for shortness of breath., Disp: 18 g, Rfl: 1 .  amLODipine (NORVASC) 10 MG tablet, TAKE 1 TABLET ONCE A DAY, Disp: 90 tablet, Rfl: 0 .  aspirin 81 MG EC tablet, Take 81 mg by mouth daily.  , Disp: , Rfl:  .  blood glucose meter kit and supplies KIT, Dispense based on patient and insurance preference. Use up to tid (for Dx: type 2 DM, with insulin therapy E11.65, Z79.4), Disp: 1 each, Rfl: 0 .  budesonide-formoterol (SYMBICORT) 160-4.5 MCG/ACT inhaler, Inhale 2 puffs into the lungs 2 (two) times daily., Disp: 3 Inhaler, Rfl: 1 .  calcium carbonate 200 MG capsule, Take 250 mg by  mouth daily. , Disp: , Rfl:  .  Cholecalciferol (VITAMIN D3) 2000 UNITS capsule, Take 2,000 Units by mouth daily. , Disp: 60 capsule, Rfl: 11 .  FLUoxetine (PROZAC) 10 MG capsule, Take 1 capsule (10 mg total) by mouth every other day., Disp: 45 capsule, Rfl: 1 .  fluticasone (FLONASE) 50 MCG/ACT nasal spray, Place 2 sprays into the nose daily as needed for allergies. Congestion , Disp: , Rfl:  .  gabapentin (NEURONTIN) 300 MG capsule, Take 1 capsule (300 mg total) by mouth 3 (three) times daily., Disp: 90 capsule, Rfl: 2 .  HUMALOG KWIKPEN 100 UNIT/ML KiwkPen, INJECT UP TO 12 UNITS SQ 3 TIMES A DAY WITH MEALS, Disp: 15 mL, Rfl: 2 .  hydrochlorothiazide (HYDRODIURIL) 25 MG tablet, Take 1 tablet (25 mg total) by mouth daily., Disp: 90 tablet, Rfl: 3 .   hydroxypropyl methylcellulose (ISOPTO TEARS) 2.5 % ophthalmic solution, Place 1 drop into both eyes daily as needed. for dry eyes , Disp: , Rfl:  .  ibuprofen (ADVIL,MOTRIN) 200 MG tablet, Take 800 mg by mouth as needed., Disp: , Rfl:  .  Insulin Glargine (LANTUS SOLOSTAR) 100 UNIT/ML Solostar Pen, Inject 50 units every morning and 54 units every evening, Disp: 90 mL, Rfl: 1 .  Insulin Pen Needle (PEN NEEDLES) 31G X 6 MM MISC, Use to administer insulin once qid. Dx E11.59 Use Leader brand, Disp: 100 each, Rfl: 1 .  LORazepam (ATIVAN) 1 MG tablet, Take 1 tablet (1 mg total) by mouth 2 (two) times daily as needed for anxiety., Disp: 60 tablet, Rfl: 2 .  metoprolol succinate (TOPROL-XL) 100 MG 24 hr tablet, Take 1 tablet (100 mg total) by mouth daily. Take with or immediately following a meal., Disp: 90 tablet, Rfl: 3 .  Multiple Vitamins-Minerals (CENTRUM SILVER PO), Take 1 tablet by mouth daily. , Disp: , Rfl:  .  ondansetron (ZOFRAN) 4 MG tablet, Take 1 tablet (4 mg total) by mouth every 8 (eight) hours as needed for nausea or vomiting., Disp: 20 tablet, Rfl: 0 .  sitaGLIPtin (JANUVIA) 100 MG tablet, Take 100 mg by mouth daily., Disp: , Rfl:  .  valsartan (DIOVAN) 320 MG tablet, TAKE 1 TABLET ONCE DAILY, Disp: 90 tablet, Rfl: 1 Social History   Socioeconomic History  . Marital status: Married    Spouse name: Not on file  . Number of children: Not on file  . Years of education: Not on file  . Highest education level: Not on file  Occupational History  . Not on file  Social Needs  . Financial resource strain: Not on file  . Food insecurity:    Worry: Not on file    Inability: Not on file  . Transportation needs:    Medical: Not on file    Non-medical: Not on file  Tobacco Use  . Smoking status: Former Smoker    Packs/day: 0.50    Years: 34.00    Pack years: 17.00    Types: Cigarettes    Last attempt to quit: 06/01/1994    Years since quitting: 24.2  . Smokeless tobacco: Never Used    Substance and Sexual Activity  . Alcohol use: No  . Drug use: No  . Sexual activity: Never    Birth control/protection: Surgical    Comment: hyst  Lifestyle  . Physical activity:    Days per week: Not on file    Minutes per session: Not on file  . Stress: Not on file  Relationships  .  Social connections:    Talks on phone: Not on file    Gets together: Not on file    Attends religious service: Not on file    Active member of club or organization: Not on file    Attends meetings of clubs or organizations: Not on file    Relationship status: Not on file  . Intimate partner violence:    Fear of current or ex partner: Not on file    Emotionally abused: Not on file    Physically abused: Not on file    Forced sexual activity: Not on file  Other Topics Concern  . Not on file  Social History Narrative   RETIRED: WORKS AS A CNA AT [employer]   1 DAUGHTER-IN [place]   O GRANDKIDS   DRINKS SOCIALLY   Family History  Problem Relation Age of Onset  . Parkinsonism Mother   . Dementia Mother   . Colon cancer Father 64       MULTIPLE CO-MORBIDITIES  . Cancer Father   . Diabetes Paternal Aunt   . Diabetes Paternal Uncle   . Diabetes Paternal Grandmother   . Congestive Heart Failure Paternal Grandfather   . Congestive Heart Failure Maternal Grandmother   . Stroke Maternal Grandfather   . Colon polyps Neg Hx     Objective: Office vital signs reviewed. BP (!) 148/80   Pulse 80   Temp 97.8 F (36.6 C) (Oral)   Ht 5' 4" (1.626 m)   Wt 221 lb (100.2 kg)   BMI 37.93 kg/m   Physical Examination:  General: Awake, alert, No acute distress HEENT: sclera white, MMM Cardio: regular rate and rhythm, C5E5 heard, systolic murmur appreciated at the RSB Pulm: clear to auscultation bilaterally, no wheezes, rhonchi or rales; normal work of breathing on room air Extremities: warm, well perfused, No edema, cyanosis or clubbing; +2 pulses bilaterally Psych: Mood stable, speech  normal, affect appropriate, pleasant and interactive.  Does not appear to be responding to internal stimuli.  Depression screen [facility] 2/9 09/08/2018 08/11/2018 06/30/2018  Decreased Interest 0 0 0  Down, Depressed, Hopeless 0 0 0  PHQ - 2 Score 0 0 0  Altered sleeping - - -  Tired, decreased energy - - -  Change in appetite - - -  Feeling bad or failure about yourself  - - -  Trouble concentrating - - -  Moving slowly or fidgety/restless - - -  Suicidal thoughts - - -  PHQ-9 Score - - -  Difficult doing work/chores - - -  Some recent data might be hidden   GAD 7 : Generalized Anxiety Score 09/08/2018 08/11/2018 06/30/2018  Nervous, Anxious, on Edge 0 1 1  Control/stop worrying 0 0 1  Worry too much - different things 0 0 1  Trouble relaxing _0 Restless 1 0 0  Easily annoyed or irritable _1 Afraid - awful might happen 0 0 0  Total GAD 7 Score _2 Anxiety Difficulty Somewhat difficult Somewhat difficult Not difficult at all    Assessment/ Plan: 72 y.o. female   1. Controlled type 2 diabetes mellitus without complication, with long-term current use of insulin (HCC) A1c today was 6.9.  This is a nice improvement since her last check.  Continue current insulin regimen.  She did voice some concern with regards to the Lantus price going up quite a bit at the end of the year because she will be in the donut  hole.  I gave her a sample of Basaglar, which should be a cheaper alternative to Lantus.  She will let me know how she tolerates it and call me if she wants me to prescribe it for her.  She will follow-up in 3 months for recheck.  We need to plan a diabetic foot exam during that visit. - Bayer DCA Hb A1c Waived  2. Hypertension associated with diabetes ([place]) Systolic blood pressure was somewhat elevated during today's exam.  Upon review of her last office visit, it seems as though she has been persistently elevated.  She is on hydrochlorothiazide 25 mg, Diovan 320 mg, metoprolol  100 mg and Norvasc 10 mg.  If her blood pressure is still elevated at next check, will need to adjust her blood pressure regimen.  We discussed lifestyle modification during today's visit. - CMP14+EGFR  3. Depression with anxiety Ativan has been refilled.  Continue Prozac 10 mg every other day. The Narcotic Database has been reviewed.  There were no red flags.  I have reduced her quantity to reflect a 1-2/ day regimen since she has decreased to 1 tablet most days.  Meds ordered this encounter  Medications  . LORazepam (ATIVAN) 1 MG tablet    Sig: Take 1 tablet (1 mg total) by mouth 2 (two) times daily as needed for anxiety.    Dispense:  45 tablet    Refill:  [place], [place] [phone]

## 2018-09-08 NOTE — Patient Instructions (Signed)
You had labs performed today.  You will be contacted with the results of the labs once they are available, usually in the next 3 business days for routine lab work.    I have given you a sample of basiglar.  This would replace the Lantus.  You may use the same amount of Basiglar that you would normally use of Lantus.  This may be a cheaper alternative on your insurance.  Call me and let me know if you want to switch.  See me in 3 months for your blood sugar check.

## 2018-09-09 LAB — CMP14+EGFR
A/G RATIO: 1.6 (ref 1.2–2.2)
ALT: 19 IU/L (ref 0–32)
AST: 13 IU/L (ref 0–40)
Albumin: 3.9 g/dL (ref 3.5–4.8)
Alkaline Phosphatase: 82 IU/L (ref 39–117)
BUN/Creatinine Ratio: 15 (ref 12–28)
BUN: 11 mg/dL (ref 8–27)
Bilirubin Total: 0.4 mg/dL (ref 0.0–1.2)
CALCIUM: 9 mg/dL (ref 8.7–10.3)
CO2: 24 mmol/L (ref 20–29)
CREATININE: 0.75 mg/dL (ref 0.57–1.00)
Chloride: 101 mmol/L (ref 96–106)
GFR, EST AFRICAN AMERICAN: 92 mL/min/{1.73_m2} (ref >59)
GFR, EST NON AFRICAN AMERICAN: 80 mL/min/{1.73_m2} (ref >59)
Globulin, Total: 2.5 g/dL (ref 1.5–4.5)
Glucose: 64 mg/dL — ABNORMAL LOW (ref 65–99)
POTASSIUM: 3.6 mmol/L (ref 3.5–5.2)
Sodium: 141 mmol/L (ref 134–144)
TOTAL PROTEIN: 6.4 g/dL (ref 6.0–8.5)

## 2018-12-09 ENCOUNTER — Ambulatory Visit (INDEPENDENT_AMBULATORY_CARE_PROVIDER_SITE_OTHER): Payer: Medicare Other | Admitting: Family Medicine

## 2018-12-09 ENCOUNTER — Encounter: Payer: Self-pay | Admitting: Family Medicine

## 2018-12-09 VITALS — BP 140/68 | HR 98 | Temp 97.7°F | Ht 64.0 in | Wt 222.0 lb

## 2018-12-09 DIAGNOSIS — N3001 Acute cystitis with hematuria: Secondary | ICD-10-CM | POA: Diagnosis not present

## 2018-12-09 DIAGNOSIS — E1169 Type 2 diabetes mellitus with other specified complication: Secondary | ICD-10-CM

## 2018-12-09 DIAGNOSIS — Z794 Long term (current) use of insulin: Secondary | ICD-10-CM

## 2018-12-09 DIAGNOSIS — Z789 Other specified health status: Secondary | ICD-10-CM | POA: Diagnosis not present

## 2018-12-09 DIAGNOSIS — B351 Tinea unguium: Secondary | ICD-10-CM

## 2018-12-09 DIAGNOSIS — R300 Dysuria: Secondary | ICD-10-CM | POA: Diagnosis not present

## 2018-12-09 DIAGNOSIS — E119 Type 2 diabetes mellitus without complications: Secondary | ICD-10-CM

## 2018-12-09 DIAGNOSIS — L819 Disorder of pigmentation, unspecified: Secondary | ICD-10-CM

## 2018-12-09 DIAGNOSIS — E1159 Type 2 diabetes mellitus with other circulatory complications: Secondary | ICD-10-CM | POA: Diagnosis not present

## 2018-12-09 DIAGNOSIS — E785 Hyperlipidemia, unspecified: Secondary | ICD-10-CM

## 2018-12-09 DIAGNOSIS — F419 Anxiety disorder, unspecified: Secondary | ICD-10-CM

## 2018-12-09 DIAGNOSIS — I10 Essential (primary) hypertension: Secondary | ICD-10-CM

## 2018-12-09 MED ORDER — CEPHALEXIN 500 MG PO CAPS: 500.0000 mg | ORAL_CAPSULE | Freq: Two times a day (BID) | ORAL | 0 refills | Status: AC

## 2018-12-09 MED ORDER — PHENAZOPYRIDINE HCL 200 MG PO TABS: 200.0000 mg | ORAL_TABLET | Freq: Three times a day (TID) | ORAL | 0 refills | Status: AC | PRN

## 2018-12-09 MED ORDER — INSULIN GLARGINE 100 UNIT/ML SOLOSTAR PEN: PEN_INJECTOR | SUBCUTANEOUS | 4 refills | Status: DC

## 2018-12-09 MED ORDER — LORAZEPAM 1 MG PO TABS: 1.0000 mg | ORAL_TABLET | Freq: Two times a day (BID) | ORAL | 2 refills | Status: DC | PRN

## 2018-12-09 NOTE — Progress Notes (Signed)
Subjective: CC: GAD, HTN/HLD, DM2, dyusira PCP: Janora Norlander, DO OIN:OMVEHM R Diana Pratt is a 73 y.o. female presenting to clinic today for:  1. GAD/ Depression History: Long-standing history of anxiety disorder.  Intolerant to Cymbalta in the past secondary to headaches.  Has been on Ativan for many years.  No history of hospitalization for mental health disorder.  Patient that she has essentially been stable on Prozac 10 mg and Ativan 1 tablet nightly.  However she has noticed some increased need for the Ativan and has been taking a little bit more often than her normal because of "feeling bad".  She thinks this is related to her current UTI.  She worries about running out of refills prior to her next visit though states she has 1 additional refill remaining.  2.  Type 2 diabetes w/ Hypertension and Hyperlipidemia  At last visit, patient had excellent control for type 2 diabetes.  She was in the donut hole and given a sample of Basaglar.  She reports that she did use a sample of Basaglar but her insurance would not cover it.  Now that she is in a new calendar year, she is no longer having issues with insurance and coverage of her medicine. High at home: 140s fasting; Low at home: No hypoglycemic episodes, Taking medication(s): Januvia, Humalog 16 units with each meal, Lantus 50 units in the morning and 54 units in the evening, Metoprolol XL 145m, Norvasc 10, Diovan 32100m HCTZ 25, Side effects: none  Last eye exam: 12/23/2017 Last foot exam: Due Last A1c:  Lab Results  Component Value Date   HGBA1C 6.9 09/08/2018   Nephropathy screen indicated?: on ACE-I Last flu, zoster and/or pneumovax:  Immunization History  Administered Date(s) Administered  . DTaP 01/04/2003  . Pneumococcal Polysaccharide-23 06/30/2018   ROS: denies fever, chills, dizziness, LOC, polyuria, polydipsia, unintended weight loss/gain, foot ulcerations, numbness or tingling in extremities or chest pain.  3.  Dysuria Patient reports a one-week history of increased urinary frequency, urgency and burning.  Denies any hematuria, nausea, vomiting, fevers, new back pain.  She does report some lower abdominal pressure and pain.  ROS: Per HPI  Allergies  Allergen Reactions  . Crestor [Rosuvastatin]     Myalgias   . Lipitor [Atorvastatin] Other (See Comments)    Myalgias   . Livalo [Pitavastatin] Other (See Comments)    myalgias  . Morphine And Related Other (See Comments)    Burning, itching  . Penicillins Hives and Itching  . Simvastatin Other (See Comments)    myalgias  . Sulfa Antibiotics Hives and Itching  . Zetia [Ezetimibe] Cough   Past Medical History:  Diagnosis Date  . Anxiety   . Asthma   . Back pain   . 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 [place] disease[place]   dr. sa[place] File. Depression   . Diabetes mellitus   . Diabetic neuropathy [place]  . GERD (gastroesophageal reflux disease)    occ heartburn  . Humerus fracture    right  . Hyperlipemia   . Hypertension   . Interstitial cystitis   . Osteoporosis   . Pneumonia    15 yrs ago  . PONV (postoperative nausea and vomiting)   . Shortness of breath dyspnea     Current Outpatient Medications:  .  ACCU-CHEK AVIVA PLUS test strip, Use to check BG up to tid.  Dx: type  2 diabetes requiring insulin therapy E11.40, Disp: 300 each, Rfl: 1 .  ACCU-CHEK SOFTCLIX LANCETS lancets, 1 each by Other route See admin instructions. Check blood sugar 3 times daily, Disp: , Rfl:  .  albuterol (PROAIR HFA) 108 (90 Base) MCG/ACT inhaler, Inhale 2 puffs into the lungs every 6 (six) hours as needed for shortness of breath., Disp: 18 g, Rfl: 1 .  amLODipine (NORVASC) 10 MG tablet, TAKE 1 TABLET ONCE A DAY, Disp: 90 tablet, Rfl: 0 .  aspirin 81 MG EC tablet, Take 81 mg by mouth daily.  , Disp: , Rfl:  .  blood glucose meter kit and supplies KIT, Dispense based on patient  and insurance preference. Use up to tid (for Dx: type 2 DM, with insulin therapy E11.65, Z79.4), Disp: 1 each, Rfl: 0 .  budesonide-formoterol (SYMBICORT) 160-4.5 MCG/ACT inhaler, Inhale 2 puffs into the lungs 2 (two) times daily., Disp: 3 Inhaler, Rfl: 1 .  calcium carbonate 200 MG capsule, Take 250 mg by mouth daily. , Disp: , Rfl:  .  Cholecalciferol (VITAMIN D3) 2000 UNITS capsule, Take 2,000 Units by mouth daily. , Disp: 60 capsule, Rfl: 11 .  FLUoxetine (PROZAC) 10 MG capsule, Take 1 capsule (10 mg total) by mouth every other day., Disp: 45 capsule, Rfl: 1 .  fluticasone (FLONASE) 50 MCG/ACT nasal spray, Place 2 sprays into the nose daily as needed for allergies. Congestion , Disp: , Rfl:  .  gabapentin (NEURONTIN) 300 MG capsule, Take 1 capsule (300 mg total) by mouth 3 (three) times daily., Disp: 90 capsule, Rfl: 2 .  HUMALOG KWIKPEN 100 UNIT/ML KiwkPen, INJECT UP TO 12 UNITS SQ 3 TIMES A DAY WITH MEALS, Disp: 15 mL, Rfl: 2 .  hydrochlorothiazide (HYDRODIURIL) 25 MG tablet, Take 1 tablet (25 mg total) by mouth daily., Disp: 90 tablet, Rfl: 3 .  hydroxypropyl methylcellulose (ISOPTO TEARS) 2.5 % ophthalmic solution, Place 1 drop into both eyes daily as needed. for dry eyes , Disp: , Rfl:  .  ibuprofen (ADVIL,MOTRIN) 200 MG tablet, Take 800 mg by mouth as needed., Disp: , Rfl:  .  Insulin Glargine (LANTUS SOLOSTAR) 100 UNIT/ML Solostar Pen, Inject 50 units every morning and 54 units every evening, Disp: 90 mL, Rfl: 1 .  Insulin Pen Needle (PEN NEEDLES) 31G X 6 MM MISC, Use to administer insulin once qid. Dx E11.59 Use Leader brand, Disp: 100 each, Rfl: 1 .  LORazepam (ATIVAN) 1 MG tablet, Take 1 tablet (1 mg total) by mouth 2 (two) times daily as needed for anxiety., Disp: 45 tablet, Rfl: 2 .  metoprolol succinate (TOPROL-XL) 100 MG 24 hr tablet, Take 1 tablet (100 mg total) by mouth daily. Take with or immediately following a meal., Disp: 90 tablet, Rfl: 3 .  Multiple Vitamins-Minerals (CENTRUM  SILVER PO), Take 1 tablet by mouth daily. , Disp: , Rfl:  .  ondansetron (ZOFRAN) 4 MG tablet, Take 1 tablet (4 mg total) by mouth every 8 (eight) hours as needed for nausea or vomiting., Disp: 20 tablet, Rfl: 0 .  sitaGLIPtin (JANUVIA) 100 MG tablet, Take 100 mg by mouth daily., Disp: , Rfl:  .  valsartan (DIOVAN) 320 MG tablet, TAKE 1 TABLET ONCE DAILY, Disp: 90 tablet, Rfl: 1 Social History   Socioeconomic History  . Marital status: Married    Spouse name: Not on file  . Number of children: Not on file  . Years of education: Not on file  . Highest education level: Not on file  Occupational History  . Not on file  Social Needs  . Financial resource strain: Not on file  . Food insecurity:    Worry: Not on file    Inability: Not on file  . Transportation needs:    Medical: Not on file    Non-medical: Not on file  Tobacco Use  . Smoking status: Former Smoker    Packs/day: 0.50    Years: 34.00    Pack years: 17.00    Types: Cigarettes    Last attempt to quit: 06/01/1994    Years since quitting: 24.5  . Smokeless tobacco: Never Used  Substance and Sexual Activity  . Alcohol use: No  . Drug use: No  . Sexual activity: Never    Birth control/protection: Surgical    Comment: hyst  Lifestyle  . Physical activity:    Days per week: Not on file    Minutes per session: Not on file  . Stress: Not on file  Relationships  . Social connections:    Talks on phone: Not on file    Gets together: Not on file    Attends religious service: Not on file    Active member of club or organization: Not on file    Attends meetings of clubs or organizations: Not on file    Relationship status: Not on file  . Intimate partner violence:    Fear of current or ex partner: Not on file    Emotionally abused: Not on file    Physically abused: Not on file    Forced sexual activity: Not on file  Other Topics Concern  . Not on file  Social History Narrative   RETIRED: WORKS AS A CNA AT [employer]   1 DAUGHTER-IN [place]   O GRANDKIDS   DRINKS SOCIALLY   Family History  Problem Relation Age of Onset  . Parkinsonism Mother   . Dementia Mother   . Colon cancer Father 44       MULTIPLE CO-MORBIDITIES  . Cancer Father   . Diabetes Paternal Aunt   . Diabetes Paternal Uncle   . Diabetes Paternal Grandmother   . Congestive Heart Failure Paternal Grandfather   . Congestive Heart Failure Maternal Grandmother   . Stroke Maternal Grandfather   . Colon polyps Neg Hx     Objective: Office vital signs reviewed. BP 140/68   Pulse 98   Temp 97.7 F (36.5 C) (Oral)   Ht 5' 4" (1.626 m)   Wt 222 lb (100.7 kg)   BMI 38.11 kg/m   Physical Examination:  General: Awake, alert, No acute distress HEENT: sclera white, MMM Cardio: regular rate and rhythm, I5O2 heard, systolic murmur appreciated at the RSB Pulm: clear to auscultation bilaterally, no wheezes, rhonchi or rales; normal work of breathing on room air Extremities: warm, well perfused, No edema, cyanosis or clubbing; +2 pulses bilaterally Skin: She has several hyperkeratotic areas between her toes bilaterally.  There are some areas with associated hyperpigmentation. Psych: Mood stable, speech normal, affect appropriate, pleasant and interactive.  Neuro: see DM foot below  Diabetic Foot Exam - Simple   Simple Foot Form Diabetic Foot exam was performed with the following findings:  Yes 12/09/2018 12:01 PM  Visual Inspection See comments:  Yes Sensation Testing Intact to touch and monofilament testing bilaterally:  Yes Pulse Check Posterior Tibialis and Dorsalis pulse intact bilaterally:  Yes Comments Onychomycotic, thickened toenails throughout the feet bilaterally.  She has or has changes in skin between several digits  of her toes that appears to be hyperkeratotic with associated pigmentation.  No skin breakdown noted     Depression screen [facility] 2/9 12/09/2018 09/08/2018 08/11/2018  Decreased Interest 0 0 0  Down,  Depressed, Hopeless 0 0 0  PHQ - 2 Score 0 0 0  Altered sleeping 0 - -  Tired, decreased energy 1 - -  Change in appetite 0 - -  Feeling bad or failure about yourself  0 - -  Trouble concentrating 0 - -  Moving slowly or fidgety/restless 0 - -  Suicidal thoughts 0 - -  PHQ-9 Score 1 - -  Difficult doing work/chores - - -  Some recent data might be hidden   GAD 7 : Generalized Anxiety Score 12/09/2018 09/08/2018 08/11/2018 06/30/2018  Nervous, Anxious, on Edge 0 0 1 1  Control/stop worrying 0 0 0 1  Worry too much - different things 0 0 0 1  Trouble relaxing 0 _0 Restless 0 1 0 0  Easily annoyed or irritable _1 Afraid - awful might happen 0 0 0 0  Total GAD 7 Score _2 Anxiety Difficulty Not difficult at all Somewhat difficult Somewhat difficult Not difficult at all    Assessment/ Plan: 73 y.o. female   1. Acute cystitis with hematuria Urine with trace blood, nitrite positive, 2+ leukocytes and 1+ glucose.  Her microscopic exam noted 11-30 white blood cells, 3-10 red blood cells and many bacteria.  Start Keflex p.o. twice daily for the next 7 days.  Pyridium also prescribed.  Urine culture sent.  Follow-up PRN. - Urinalysis, Complete - Urine Culture  2. Controlled type 2 diabetes mellitus without complication, with long-term current use of insulin ([place]) Under excellent control with current therapies.  A1c today was 6.8.  Diabetic foot exam performed today.  I have referred her to podiatry for care of her nails, which were long and thickened.  I have also placed a referral to dermatology for further evaluation of her skin.  She has some thickened, pigmented lesion noted between the toes.  This is likely reflective of fungal infection versus friction but would like to rule out other etiology. - Bayer DCA Hb A1c Waived - Ambulatory referral to Podiatry  3. Hyperlipidemia associated with type 2 diabetes mellitus (HCC) Statin intolerant.  4. Hypertension associated with  diabetes ([place]) Under good control with current regimen.  No changes made.  5. Statin intolerance  6. Anxiety Controlled.  Ativan sent.  She has 1 additional refill remaining.  May start new prescription at appropriate interval following completion of current prescription.  National narcotic database reviewed and there were no red flags.  Last refill was in January  7. Onychomycosis As above - Ambulatory referral to Podiatry  8. Pigmented skin lesion of uncertain nature As above - Ambulatory referral to Dermatology   Meds ordered this encounter  Medications  . cephALEXin (KEFLEX) 500 MG capsule    Sig: Take 1 capsule (500 mg total) by mouth 2 (two) times daily for 7 days.    Dispense:  14 capsule    Refill:  0  . phenazopyridine (PYRIDIUM) 200 MG tablet    Sig: Take 1 tablet (200 mg total) by mouth 3 (three) times daily as needed for up to 2 days for pain.    Dispense:  6 tablet    Refill:  0  . LORazepam (ATIVAN) 1 MG tablet    Sig: Take 1 tablet (1  mg total) by mouth 2 (two) times daily as needed for anxiety.    Dispense:  45 tablet    Refill:  2    Please place on hold.  Patient should have 1 additional fill on previous rx.  . Insulin Glargine (LANTUS SOLOSTAR) 100 UNIT/ML Solostar Pen    Sig: Inject 50 units every morning and 54 units every evening    Dispense:  90 mL    Refill:  [place], [place] [phone]

## 2018-12-10 LAB — URINALYSIS, COMPLETE
Bilirubin, UA: NEGATIVE
Ketones, UA: NEGATIVE
Nitrite, UA: POSITIVE — AB
Protein, UA: NEGATIVE
Specific Gravity, UA: 1.02 (ref 1.005–1.030)
UUROB: 0.2 mg/dL (ref 0.2–1.0)
pH, UA: 5 (ref 5.0–7.5)

## 2018-12-10 LAB — MICROSCOPIC EXAMINATION: Renal Epithel, UA: NONE SEEN /hpf

## 2018-12-10 LAB — BAYER DCA HB A1C WAIVED: HB A1C (BAYER DCA - WAIVED): 6.8 % (ref <7.0)

## 2018-12-12 LAB — URINE CULTURE

## 2018-12-16 DIAGNOSIS — E1142 Type 2 diabetes mellitus with diabetic polyneuropathy: Secondary | ICD-10-CM | POA: Diagnosis not present

## 2018-12-16 DIAGNOSIS — B351 Tinea unguium: Secondary | ICD-10-CM | POA: Diagnosis not present

## 2018-12-16 DIAGNOSIS — L84 Corns and callosities: Secondary | ICD-10-CM | POA: Diagnosis not present

## 2018-12-16 DIAGNOSIS — M79673 Pain in unspecified foot: Secondary | ICD-10-CM | POA: Diagnosis not present

## 2019-01-07 ENCOUNTER — Other Ambulatory Visit: Payer: Self-pay | Admitting: *Deleted

## 2019-01-07 MED ORDER — INSULIN LISPRO (1 UNIT DIAL) 100 UNIT/ML (KWIKPEN): 15.0000 [IU] | PEN_INJECTOR | Freq: Three times a day (TID) | SUBCUTANEOUS | 2 refills | Status: DC

## 2019-01-12 DIAGNOSIS — L820 Inflamed seborrheic keratosis: Secondary | ICD-10-CM | POA: Diagnosis not present

## 2019-01-12 DIAGNOSIS — L821 Other seborrheic keratosis: Secondary | ICD-10-CM | POA: Diagnosis not present

## 2019-01-12 DIAGNOSIS — D229 Melanocytic nevi, unspecified: Secondary | ICD-10-CM | POA: Diagnosis not present

## 2019-01-12 DIAGNOSIS — L570 Actinic keratosis: Secondary | ICD-10-CM | POA: Diagnosis not present

## 2019-03-10 ENCOUNTER — Other Ambulatory Visit: Payer: Self-pay

## 2019-03-10 ENCOUNTER — Ambulatory Visit (INDEPENDENT_AMBULATORY_CARE_PROVIDER_SITE_OTHER): Payer: Medicare Other | Admitting: Family Medicine

## 2019-03-10 DIAGNOSIS — E119 Type 2 diabetes mellitus without complications: Secondary | ICD-10-CM | POA: Diagnosis not present

## 2019-03-10 DIAGNOSIS — F419 Anxiety disorder, unspecified: Secondary | ICD-10-CM | POA: Diagnosis not present

## 2019-03-10 DIAGNOSIS — Z789 Other specified health status: Secondary | ICD-10-CM

## 2019-03-10 DIAGNOSIS — I10 Essential (primary) hypertension: Secondary | ICD-10-CM

## 2019-03-10 DIAGNOSIS — N3000 Acute cystitis without hematuria: Secondary | ICD-10-CM

## 2019-03-10 DIAGNOSIS — E1169 Type 2 diabetes mellitus with other specified complication: Secondary | ICD-10-CM | POA: Diagnosis not present

## 2019-03-10 DIAGNOSIS — E1159 Type 2 diabetes mellitus with other circulatory complications: Secondary | ICD-10-CM

## 2019-03-10 DIAGNOSIS — E785 Hyperlipidemia, unspecified: Secondary | ICD-10-CM

## 2019-03-10 MED ORDER — CEPHALEXIN 500 MG PO CAPS: 500.0000 mg | ORAL_CAPSULE | Freq: Two times a day (BID) | ORAL | 0 refills | Status: AC

## 2019-03-10 MED ORDER — GABAPENTIN 300 MG PO CAPS: 300.0000 mg | ORAL_CAPSULE | Freq: Three times a day (TID) | ORAL | 2 refills | Status: DC

## 2019-03-10 MED ORDER — FLUOXETINE HCL 10 MG PO CAPS: 10.0000 mg | ORAL_CAPSULE | ORAL | 1 refills | Status: DC

## 2019-03-10 MED ORDER — METOPROLOL SUCCINATE ER 100 MG PO TB24: 100.0000 mg | ORAL_TABLET | Freq: Every day | ORAL | 3 refills | Status: DC

## 2019-03-10 MED ORDER — VALSARTAN 320 MG PO TABS: 320.0000 mg | ORAL_TABLET | Freq: Every day | ORAL | 3 refills | Status: DC

## 2019-03-10 MED ORDER — INSULIN GLARGINE 100 UNIT/ML SOLOSTAR PEN: PEN_INJECTOR | SUBCUTANEOUS | 4 refills | Status: DC

## 2019-03-10 MED ORDER — AMLODIPINE BESYLATE 10 MG PO TABS: 10.0000 mg | ORAL_TABLET | Freq: Every day | ORAL | 3 refills | Status: DC

## 2019-03-10 MED ORDER — LORAZEPAM 1 MG PO TABS: 1.0000 mg | ORAL_TABLET | Freq: Two times a day (BID) | ORAL | 2 refills | Status: DC | PRN

## 2019-03-10 NOTE — Progress Notes (Signed)
Telephone visit  Subjective: CC: DM2/ GAD/Depression/ HTN PCP: Janora Norlander, DO KJZ:PHXTAV Diana Pratt is a 73 y.o. female calls for telephone consult today. Patient provides verbal consent for consult held via phone.  Location of patient: home Location of provider: [facility] Others present for call: none  1. Type 2 Diabetes w/ HTN/ HLD:  Patient reports she continues to have fasting blood sugars around 150s in the morning.  She has been using Lantus 50 units every morning and 54 units every afternoon as well as Humalog 16 units with each meal.  She takes Januvia 100 mg daily orally.  No change in diet.  She admits to using her husband supply of blood pressure medication because he gets these free from the [place].  Though she does report compliance with her amlodipine, valsartan, metoprolol and hydrochlorothiazide.  She has had statin intolerance and therefore is not on a statin for cholesterol.  Last eye exam: due Last foot exam: UTD Last A1c:  Lab Results  Component Value Date   HGBA1C 6.8 12/09/2018   Nephropathy screen indicated?: on ARB Last flu, zoster and/or pneumovax:  Immunization History  Administered Date(s) Administered  . DTaP 01/04/2003  . Pneumococcal Polysaccharide-23 06/30/2018    ROS: denies fever, chills, dizziness, LOC, polyuria, polydipsia, unintended weight loss/gain, foot ulcerations, numbness or tingling in extremities or chest pain.  2. UTI Patient reports onset of dysuria last Friday.  She denies any hematuria, nausea, vomiting, pelvic pain or abnormal vaginal discharge.  3.  Anxiety Patient with known anxiety disorder.  She reports compliance with Prozac 10 mg daily.  She uses the lorazepam nightly and occasionally uses this during the day.  Though she admits that this is not frequently.  Her last refill was 02/10/2019.  She will be due for refills in 2 days and asked that we send these electronically today.   ROS: Per HPI  Allergies  Allergen Reactions   . Crestor [Rosuvastatin]     Myalgias   . Lipitor [Atorvastatin] Other (See Comments)    Myalgias   . Livalo [Pitavastatin] Other (See Comments)    myalgias  . Morphine And Related Other (See Comments)    Burning, itching  . Penicillins Hives and Itching  . Simvastatin Other (See Comments)    myalgias  . Sulfa Antibiotics Hives and Itching  . Zetia [Ezetimibe] Cough   Past Medical History:  Diagnosis Date  . Anxiety   . Asthma   . Back pain   . 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 COPD (chronic obstructive pulmonary disease) ([place])    dr. Kenn File  . Depression   . Diabetes mellitus   . Diabetic neuropathy ([place])   . GERD (gastroesophageal reflux disease)    occ heartburn  . Humerus fracture    right  . Hyperlipemia   . Hypertension   . Interstitial cystitis   . Osteoporosis   . Pneumonia    15 yrs ago  . PONV (postoperative nausea and vomiting)   . Shortness of breath dyspnea     Current Outpatient Medications:  .  ACCU-CHEK AVIVA PLUS test strip, Use to check BG up to tid.  Dx: type 2 diabetes requiring insulin therapy E11.40, Disp: 300 each, Rfl: 1 .  ACCU-CHEK SOFTCLIX LANCETS lancets, 1 each by Other route See admin instructions. Check blood sugar 3 times daily, Disp: , Rfl:  .  albuterol (PROAIR HFA) 108 (90 Base) MCG/ACT inhaler, Inhale  2 puffs into the lungs every 6 (six) hours as needed for shortness of breath., Disp: 18 g, Rfl: 1 .  amLODipine (NORVASC) 10 MG tablet, TAKE 1 TABLET ONCE A DAY, Disp: 90 tablet, Rfl: 0 .  aspirin 81 MG EC tablet, Take 81 mg by mouth daily.  , Disp: , Rfl:  .  blood glucose meter kit and supplies KIT, Dispense based on patient and insurance preference. Use up to tid (for Dx: type 2 DM, with insulin therapy E11.65, Z79.4), Disp: 1 each, Rfl: 0 .  budesonide-formoterol (SYMBICORT) 160-4.5 MCG/ACT inhaler, Inhale 2 puffs into the lungs 2 (two) times daily., Disp: 3 Inhaler, Rfl: 1 .  calcium  carbonate 200 MG capsule, Take 250 mg by mouth daily. , Disp: , Rfl:  .  Cholecalciferol (VITAMIN D3) 2000 UNITS capsule, Take 2,000 Units by mouth daily. , Disp: 60 capsule, Rfl: 11 .  FLUoxetine (PROZAC) 10 MG capsule, Take 1 capsule (10 mg total) by mouth every other day., Disp: 45 capsule, Rfl: 1 .  fluticasone (FLONASE) 50 MCG/ACT nasal spray, Place 2 sprays into the nose daily as needed for allergies. Congestion , Disp: , Rfl:  .  gabapentin (NEURONTIN) 300 MG capsule, Take 1 capsule (300 mg total) by mouth 3 (three) times daily., Disp: 90 capsule, Rfl: 2 .  hydrochlorothiazide (HYDRODIURIL) 25 MG tablet, Take 1 tablet (25 mg total) by mouth daily., Disp: 90 tablet, Rfl: 3 .  hydroxypropyl methylcellulose (ISOPTO TEARS) 2.5 % ophthalmic solution, Place 1 drop into both eyes daily as needed. for dry eyes , Disp: , Rfl:  .  ibuprofen (ADVIL,MOTRIN) 200 MG tablet, Take 800 mg by mouth as needed., Disp: , Rfl:  .  Insulin Glargine (LANTUS SOLOSTAR) 100 UNIT/ML Solostar Pen, Inject 50 units every morning and 54 units every evening, Disp: 90 mL, Rfl: 4 .  insulin lispro (HUMALOG KWIKPEN) 100 UNIT/ML KwikPen, Inject 0.15 mLs (15 Units total) into the skin 3 (three) times daily with meals., Disp: 15 mL, Rfl: 2 .  Insulin Pen Needle (PEN NEEDLES) 31G X 6 MM MISC, Use to administer insulin once qid. Dx E11.59 Use Leader brand, Disp: 100 each, Rfl: 1 .  LORazepam (ATIVAN) 1 MG tablet, Take 1 tablet (1 mg total) by mouth 2 (two) times daily as needed for anxiety., Disp: 45 tablet, Rfl: 2 .  metoprolol succinate (TOPROL-XL) 100 MG 24 hr tablet, Take 1 tablet (100 mg total) by mouth daily. Take with or immediately following a meal., Disp: 90 tablet, Rfl: 3 .  Multiple Vitamins-Minerals (CENTRUM SILVER PO), Take 1 tablet by mouth daily. , Disp: , Rfl:  .  ondansetron (ZOFRAN) 4 MG tablet, Take 1 tablet (4 mg total) by mouth every 8 (eight) hours as needed for nausea or vomiting., Disp: 20 tablet, Rfl: 0 .   sitaGLIPtin (JANUVIA) 100 MG tablet, Take 100 mg by mouth daily., Disp: , Rfl:  .  valsartan (DIOVAN) 320 MG tablet, TAKE 1 TABLET ONCE DAILY, Disp: 90 tablet, Rfl: 1  Assessment/ Plan: 73 y.o. female   1. Diabetes mellitus without complication ([place]) Not controlled and therefore we have increased her evening dose of Lantus to 55 units nightly.  She will follow-up with me in 3 months for recheck of A1c.  This has been scheduled - gabapentin (NEURONTIN) 300 MG capsule; Take 1 capsule (300 mg total) by mouth 3 (three) times daily.  Dispense: 90 capsule; Refill: 2 - Insulin Glargine (LANTUS SOLOSTAR) 100 UNIT/ML Solostar Pen; Inject 50 units every  morning and 55 units every evening  Dispense: 90 mL; Refill: 4  2. Hypertension associated with diabetes ([place]) Controlled.  Continue current regimen - amLODipine (NORVASC) 10 MG tablet; Take 1 tablet (10 mg total) by mouth daily.  Dispense: 90 tablet; Refill: 3 - metoprolol succinate (TOPROL-XL) 100 MG 24 hr tablet; Take 1 tablet (100 mg total) by mouth daily. Take with or immediately following a meal.  Dispense: 90 tablet; Refill: 3 - valsartan (DIOVAN) 320 MG tablet; Take 1 tablet (320 mg total) by mouth daily.  Dispense: 90 tablet; Refill: 3  3. Hyperlipidemia associated with type 2 diabetes mellitus ([place]) Check lipid panel at next visit  4. Anxiety National narcotic database was reviewed and there were no red flags.  Refill sent of the Ativan.  Use sparingly.  Continue fluoxetine.  Consider increase to 20 mg if she has persistent need for Ativan. - LORazepam (ATIVAN) 1 MG tablet; Take 1 tablet (1 mg total) by mouth 2 (two) times daily as needed for anxiety.  Dispense: 45 tablet; Refill: 2 - FLUoxetine (PROZAC) 10 MG capsule; Take 1 capsule (10 mg total) by mouth every other day.  Dispense: 45 capsule; Refill: 1  5. Acute cystitis without hematuria Last urine culture in February with Klebsiella pneumonia sensitive to cephalosporins.  Keflex p.o.  twice daily for 7 days prescribed - cephALEXin (KEFLEX) 500 MG capsule; Take 1 capsule (500 mg total) by mouth 2 (two) times daily for 7 days.  Dispense: 14 capsule; Refill: 0  6. Statin intolerance   Start time: 11am End time: 11:10am  Total time spent on patient care (including telephone call/ virtual visit): 24 minutes  [place], [place] [phone]

## 2019-04-15 ENCOUNTER — Other Ambulatory Visit: Payer: Self-pay

## 2019-04-15 ENCOUNTER — Ambulatory Visit (INDEPENDENT_AMBULATORY_CARE_PROVIDER_SITE_OTHER): Payer: Medicare Other | Admitting: Family Medicine

## 2019-04-15 DIAGNOSIS — N3000 Acute cystitis without hematuria: Secondary | ICD-10-CM | POA: Diagnosis not present

## 2019-04-15 MED ORDER — FLUCONAZOLE 150 MG PO TABS: 150.0000 mg | ORAL_TABLET | Freq: Once | ORAL | 0 refills | Status: AC

## 2019-04-15 MED ORDER — CEPHALEXIN 500 MG PO CAPS: 500.0000 mg | ORAL_CAPSULE | Freq: Two times a day (BID) | ORAL | 0 refills | Status: AC

## 2019-04-15 NOTE — Progress Notes (Signed)
Telephone visit  Subjective: CC: UTI PCP: Janora Norlander, DO FMB:[id number] R Asbill is a 73 y.o. female calls for telephone consult today. Patient provides verbal consent for consult held via phone.  Location of patient: home Location of provider: [facility] Others present for call: none  1. Urinary symptoms Patient reports a 1 day h/o urinary frequency and dysuria.  She denies urgency, hematuria, fevers, chills, abdominal pain, nausea, vomiting, back pain, vaginal discharge.  Patient has used nothing for symptoms.  Her last urinary tract infection was in February and yielded Klebsiella pneumoniae.  This was sensitive to cephalosporins.  She tolerated the cephalosporin prescribed in February without difficulty.  Patient denies a h/o frequent or recurrent UTIs.      ROS: Per HPI  Allergies  Allergen Reactions  . Crestor [Rosuvastatin]     Myalgias   . Lipitor [Atorvastatin] Other (See Comments)    Myalgias   . Livalo [Pitavastatin] Other (See Comments)    myalgias  . Morphine And Related Other (See Comments)    Burning, itching  . Penicillins Hives and Itching  . Simvastatin Other (See Comments)    myalgias  . Sulfa Antibiotics Hives and Itching  . Zetia [Ezetimibe] Cough   Past Medical History:  Diagnosis Date  . Anxiety   . Asthma   . Back pain   . 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 COPD (chronic obstructive pulmonary disease) ([place])    dr. Kenn File  . Depression   . Diabetes mellitus   . Diabetic neuropathy ([place])   . GERD (gastroesophageal reflux disease)    occ heartburn  . Humerus fracture    right  . Hyperlipemia   . Hypertension   . Interstitial cystitis   . Osteoporosis   . Pneumonia    15 yrs ago  . PONV (postoperative nausea and vomiting)   . Shortness of breath dyspnea     Current Outpatient Medications:  .  ACCU-CHEK AVIVA PLUS test strip, Use to check BG up to tid.  Dx: type 2 diabetes requiring insulin  therapy E11.40, Disp: 300 each, Rfl: 1 .  ACCU-CHEK SOFTCLIX LANCETS lancets, 1 each by Other route See admin instructions. Check blood sugar 3 times daily, Disp: , Rfl:  .  albuterol (PROAIR HFA) 108 (90 Base) MCG/ACT inhaler, Inhale 2 puffs into the lungs every 6 (six) hours as needed for shortness of breath., Disp: 18 g, Rfl: 1 .  amLODipine (NORVASC) 10 MG tablet, Take 1 tablet (10 mg total) by mouth daily., Disp: 90 tablet, Rfl: 3 .  aspirin 81 MG EC tablet, Take 81 mg by mouth daily.  , Disp: , Rfl:  .  blood glucose meter kit and supplies KIT, Dispense based on patient and insurance preference. Use up to tid (for Dx: type 2 DM, with insulin therapy E11.65, Z79.4), Disp: 1 each, Rfl: 0 .  budesonide-formoterol (SYMBICORT) 160-4.5 MCG/ACT inhaler, Inhale 2 puffs into the lungs 2 (two) times daily., Disp: 3 Inhaler, Rfl: 1 .  calcium carbonate 200 MG capsule, Take 250 mg by mouth daily. , Disp: , Rfl:  .  Cholecalciferol (VITAMIN D3) 2000 UNITS capsule, Take 2,000 Units by mouth daily. , Disp: 60 capsule, Rfl: 11 .  FLUoxetine (PROZAC) 10 MG capsule, Take 1 capsule (10 mg total) by mouth every other day., Disp: 45 capsule, Rfl: 1 .  fluticasone (FLONASE) 50 MCG/ACT nasal spray, Place 2 sprays into the nose daily as needed  for allergies. Congestion , Disp: , Rfl:  .  gabapentin (NEURONTIN) 300 MG capsule, Take 1 capsule (300 mg total) by mouth 3 (three) times daily., Disp: 90 capsule, Rfl: 2 .  hydrochlorothiazide (HYDRODIURIL) 25 MG tablet, Take 1 tablet (25 mg total) by mouth daily., Disp: 90 tablet, Rfl: 3 .  hydroxypropyl methylcellulose (ISOPTO TEARS) 2.5 % ophthalmic solution, Place 1 drop into both eyes daily as needed. for dry eyes , Disp: , Rfl:  .  ibuprofen (ADVIL,MOTRIN) 200 MG tablet, Take 800 mg by mouth as needed., Disp: , Rfl:  .  Insulin Glargine (LANTUS SOLOSTAR) 100 UNIT/ML Solostar Pen, Inject 50 units every morning and 55 units every evening, Disp: 90 mL, Rfl: 4 .  insulin  lispro (HUMALOG KWIKPEN) 100 UNIT/ML KwikPen, Inject 0.15 mLs (15 Units total) into the skin 3 (three) times daily with meals., Disp: 15 mL, Rfl: 2 .  Insulin Pen Needle (PEN NEEDLES) 31G X 6 MM MISC, Use to administer insulin once qid. Dx E11.59 Use Leader brand, Disp: 100 each, Rfl: 1 .  LORazepam (ATIVAN) 1 MG tablet, Take 1 tablet (1 mg total) by mouth 2 (two) times daily as needed for anxiety., Disp: 45 tablet, Rfl: 2 .  metoprolol succinate (TOPROL-XL) 100 MG 24 hr tablet, Take 1 tablet (100 mg total) by mouth daily. Take with or immediately following a meal., Disp: 90 tablet, Rfl: 3 .  Multiple Vitamins-Minerals (CENTRUM SILVER PO), Take 1 tablet by mouth daily. , Disp: , Rfl:  .  sitaGLIPtin (JANUVIA) 100 MG tablet, Take 100 mg by mouth daily., Disp: , Rfl:  .  valsartan (DIOVAN) 320 MG tablet, Take 1 tablet (320 mg total) by mouth daily., Disp: 90 tablet, Rfl: 3  Assessment/ Plan: 73 y.o. female   1. Acute cystitis without hematuria We will treat with Keflex twice daily for 7 days and Diflucan PRN yeast vaginitis.  We discussed increasing oral fluids and reasons for return evaluation.  If no significant improvement by Friday would like her to be seen in office for urine sample and culture. - fluconazole (DIFLUCAN) 150 MG tablet; Take 1 tablet (150 mg total) by mouth once for 1 dose.  Dispense: 1 tablet; Refill: 0 - cephALEXin (KEFLEX) 500 MG capsule; Take 1 capsule (500 mg total) by mouth 2 (two) times daily for 7 days.  Dispense: 14 capsule; Refill: 0   Start time: 9:27am End time: 9:32am  Total time spent on patient care (including telephone call/ virtual visit): 10 minutes  [place], [place] [phone]

## 2019-04-23 ENCOUNTER — Encounter: Payer: Self-pay | Admitting: *Deleted

## 2019-05-18 ENCOUNTER — Telehealth: Payer: Self-pay | Admitting: Family Medicine

## 2019-05-21 ENCOUNTER — Encounter: Payer: Medicare Other | Admitting: *Deleted

## 2019-05-22 ENCOUNTER — Ambulatory Visit (INDEPENDENT_AMBULATORY_CARE_PROVIDER_SITE_OTHER): Payer: Medicare Other | Admitting: Family Medicine

## 2019-05-22 ENCOUNTER — Other Ambulatory Visit: Payer: Self-pay

## 2019-05-22 DIAGNOSIS — R300 Dysuria: Secondary | ICD-10-CM

## 2019-05-22 DIAGNOSIS — R3915 Urgency of urination: Secondary | ICD-10-CM

## 2019-05-22 MED ORDER — FLUCONAZOLE 150 MG PO TABS: 150.0000 mg | ORAL_TABLET | Freq: Once | ORAL | 0 refills | Status: AC

## 2019-05-22 MED ORDER — CIPROFLOXACIN HCL 500 MG PO TABS: 500.0000 mg | ORAL_TABLET | Freq: Two times a day (BID) | ORAL | 0 refills | Status: AC

## 2019-05-22 NOTE — Progress Notes (Signed)
Virtual Visit via Telephone Note  I connected with Diana Pratt on 05/22/19 at 11:09 AM by telephone and verified that I am speaking with the correct person using two identifiers. Diana Pratt is currently located at home and nobody is currently with her during this visit. The provider, Loman Brooklyn, FNP is located in their home at time of visit.  I discussed the limitations, risks, security and privacy concerns of performing an evaluation and management service by telephone and the availability of in person appointments. I also discussed with the patient that there may be a patient responsible charge related to this service. The patient expressed understanding and agreed to proceed.  Subjective: PCP: Janora Norlander, DO  Chief Complaint  Patient presents with  . Dysuria   Urinary Tract Infection  This is a recurrent problem. The current episode started in the past 7 days (3 days ago). The problem occurs every urination. The problem has been gradually worsening. The quality of the pain is described as burning. The pain is at a severity of 5/10. The pain is moderate. There has been no fever. Associated symptoms include frequency and urgency. Pertinent negatives include no flank pain, hematuria, nausea or vomiting. She has tried antibiotics for the symptoms. The treatment provided significant relief. Her past medical history is significant for recurrent UTIs. There is no history of catheterization or kidney stones.    ROS: Per HPI  Allergies  Allergen Reactions  . Crestor [Rosuvastatin]     Myalgias   . Lipitor [Atorvastatin] Other (See Comments)    Myalgias   . Livalo [Pitavastatin] Other (See Comments)    myalgias  . Morphine And Related Other (See Comments)    Burning, itching  . Penicillins Hives and Itching  . Simvastatin Other (See Comments)    myalgias  . Sulfa Antibiotics Hives and Itching  . Zetia [Ezetimibe] Cough   Past Medical History:  Diagnosis Date   . Anxiety   . Asthma   . Back pain   . 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 COPD (chronic obstructive pulmonary disease) ([place])    dr. Kenn File  . Depression   . Diabetes mellitus   . Diabetic neuropathy ([place])   . GERD (gastroesophageal reflux disease)    occ heartburn  . Humerus fracture    right  . Hyperlipemia   . Hypertension   . Interstitial cystitis   . Osteoporosis   . Pneumonia    15 yrs ago  . PONV (postoperative nausea and vomiting)   . Shortness of breath dyspnea     Current Outpatient Medications:  .  ACCU-CHEK AVIVA PLUS test strip, Use to check BG up to tid.  Dx: type 2 diabetes requiring insulin therapy E11.40, Disp: 300 each, Rfl: 1 .  ACCU-CHEK SOFTCLIX LANCETS lancets, 1 each by Other route See admin instructions. Check blood sugar 3 times daily, Disp: , Rfl:  .  albuterol (PROAIR HFA) 108 (90 Base) MCG/ACT inhaler, Inhale 2 puffs into the lungs every 6 (six) hours as needed for shortness of breath., Disp: 18 g, Rfl: 1 .  amLODipine (NORVASC) 10 MG tablet, Take 1 tablet (10 mg total) by mouth daily., Disp: 90 tablet, Rfl: 3 .  aspirin 81 MG EC tablet, Take 81 mg by mouth daily.  , Disp: , Rfl:  .  blood glucose meter kit and supplies KIT, Dispense based on patient and insurance preference. Use up  to tid (for Dx: type 2 DM, with insulin therapy E11.65, Z79.4), Disp: 1 each, Rfl: 0 .  budesonide-formoterol (SYMBICORT) 160-4.5 MCG/ACT inhaler, Inhale 2 puffs into the lungs 2 (two) times daily., Disp: 3 Inhaler, Rfl: 1 .  calcium carbonate 200 MG capsule, Take 250 mg by mouth daily. , Disp: , Rfl:  .  Cholecalciferol (VITAMIN D3) 2000 UNITS capsule, Take 2,000 Units by mouth daily. , Disp: 60 capsule, Rfl: 11 .  FLUoxetine (PROZAC) 10 MG capsule, Take 1 capsule (10 mg total) by mouth every other day., Disp: 45 capsule, Rfl: 1 .  fluticasone (FLONASE) 50 MCG/ACT nasal spray, Place 2 sprays into the nose daily as needed for  allergies. Congestion , Disp: , Rfl:  .  gabapentin (NEURONTIN) 300 MG capsule, Take 1 capsule (300 mg total) by mouth 3 (three) times daily., Disp: 90 capsule, Rfl: 2 .  hydrochlorothiazide (HYDRODIURIL) 25 MG tablet, Take 1 tablet (25 mg total) by mouth daily., Disp: 90 tablet, Rfl: 3 .  hydroxypropyl methylcellulose (ISOPTO TEARS) 2.5 % ophthalmic solution, Place 1 drop into both eyes daily as needed. for dry eyes , Disp: , Rfl:  .  ibuprofen (ADVIL,MOTRIN) 200 MG tablet, Take 800 mg by mouth as needed., Disp: , Rfl:  .  Insulin Glargine (LANTUS SOLOSTAR) 100 UNIT/ML Solostar Pen, Inject 50 units every morning and 55 units every evening, Disp: 90 mL, Rfl: 4 .  insulin lispro (HUMALOG KWIKPEN) 100 UNIT/ML KwikPen, Inject 0.15 mLs (15 Units total) into the skin 3 (three) times daily with meals., Disp: 15 mL, Rfl: 2 .  Insulin Pen Needle (PEN NEEDLES) 31G X 6 MM MISC, Use to administer insulin once qid. Dx E11.59 Use Leader brand, Disp: 100 each, Rfl: 1 .  LORazepam (ATIVAN) 1 MG tablet, Take 1 tablet (1 mg total) by mouth 2 (two) times daily as needed for anxiety., Disp: 45 tablet, Rfl: 2 .  metoprolol succinate (TOPROL-XL) 100 MG 24 hr tablet, Take 1 tablet (100 mg total) by mouth daily. Take with or immediately following a meal., Disp: 90 tablet, Rfl: 3 .  Multiple Vitamins-Minerals (CENTRUM SILVER PO), Take 1 tablet by mouth daily. , Disp: , Rfl:  .  sitaGLIPtin (JANUVIA) 100 MG tablet, Take 100 mg by mouth daily., Disp: , Rfl:  .  valsartan (DIOVAN) 320 MG tablet, Take 1 tablet (320 mg total) by mouth daily., Disp: 90 tablet, Rfl: 3   Observations/Objective: A&O  No respiratory distress or wheezing audible over the phone Mood, judgement, and thought processes all WNL  Assessment and Plan: 1-2. Dysuria/Urinary urgency - Treating as a UTI since this is a telephone encounter. Advised her to discuss recurrent UTIs with PCP when she sees her next month. Diflucan as patient states she gets a  yeast infection every time she takes an antibiotic. Longer treatment with Cipro since this has been recurrent. Education provided on UTIs.  - ciprofloxacin (CIPRO) 500 MG tablet; Take 1 tablet (500 mg total) by mouth 2 (two) times daily for 10 days.  Dispense: 20 tablet; Refill: 0 - fluconazole (DIFLUCAN) 150 MG tablet; Take 1 tablet (150 mg total) by mouth once for 1 dose.  Dispense: 1 tablet; Refill: 0   Follow Up Instructions:  I discussed the assessment and treatment plan with the patient. The patient was provided an opportunity to ask questions and all were answered. The patient agreed with the plan and demonstrated an understanding of the instructions.   The patient was advised to call back or seek  an in-person evaluation if the symptoms worsen or if the condition fails to improve as anticipated.  The above assessment and management plan was discussed with the patient. The patient verbalized understanding of and has agreed to the management plan. Patient is aware to call the clinic if symptoms persist or worsen. Patient is aware when to return to the clinic for a follow-up visit. Patient educated on when it is appropriate to go to the emergency department.   Time call ended: 11:20 AM  I provided 11 minutes of non-face-to-face time during this encounter.  Hendricks Limes, MSN, APRN, FNP-C [facility] 05/22/19

## 2019-05-22 NOTE — Patient Instructions (Signed)
Urinary Tract Infection, Adult A urinary tract infection (UTI) is an infection of any part of the urinary tract. The urinary tract includes:  The kidneys.  The ureters.  The bladder.  The urethra. These organs make, store, and get rid of pee (urine) in the body. What are the causes? This is caused by germs (bacteria) in your genital area. These germs grow and cause swelling (inflammation) of your urinary tract. What increases the risk? You are more likely to develop this condition if:  You have a small, thin tube (catheter) to drain pee.  You cannot control when you pee or poop (incontinence).  You are female, and: ? You use these methods to prevent pregnancy: ? A medicine that kills sperm (spermicide). ? A device that blocks sperm (diaphragm). ? You have low levels of a female hormone (estrogen). ? You are pregnant.  You have genes that add to your risk.  You are sexually active.  You take antibiotic medicines.  You have trouble peeing because of: ? A prostate that is bigger than normal, if you are female. ? A blockage in the part of your body that drains pee from the bladder (urethra). ? A kidney stone. ? A nerve condition that affects your bladder (neurogenic bladder). ? Not getting enough to drink. ? Not peeing often enough.  You have other conditions, such as: ? Diabetes. ? A weak disease-fighting system (immune system). ? Sickle cell disease. ? Gout. ? Injury of the spine. What are the signs or symptoms? Symptoms of this condition include:  Needing to pee right away (urgently).  Peeing often.  Peeing small amounts often.  Pain or burning when peeing.  Blood in the pee.  Pee that smells bad or not like normal.  Trouble peeing.  Pee that is cloudy.  Fluid coming from the vagina, if you are female.  Pain in the belly or lower back. Other symptoms include:  Throwing up (vomiting).  No urge to eat.  Feeling mixed up (confused).  Being tired  and grouchy (irritable).  A fever.  Watery poop (diarrhea). How is this treated? This condition may be treated with:  Antibiotic medicine.  Other medicines.  Drinking enough water. Follow these instructions at home:  Medicines  Take over-the-counter and prescription medicines only as told by your doctor.  If you were prescribed an antibiotic medicine, take it as told by your doctor. Do not stop taking it even if you start to feel better. General instructions  Make sure you: ? Pee until your bladder is empty. ? Do not hold pee for a long time. ? Empty your bladder after sex. ? Wipe from front to back after pooping if you are a female. Use each tissue one time when you wipe.  Drink enough fluid to keep your pee pale yellow.  Keep all follow-up visits as told by your doctor. This is important. Contact a doctor if:  You do not get better after 1-2 days.  Your symptoms go away and then come back. Get help right away if:  You have very bad back pain.  You have very bad pain in your lower belly.  You have a fever.  You are sick to your stomach (nauseous).  You are throwing up. Summary  A urinary tract infection (UTI) is an infection of any part of the urinary tract.  This condition is caused by germs in your genital area.  There are many risk factors for a UTI. These include having a small, thin   tube to drain pee and not being able to control when you pee or poop.  Treatment includes antibiotic medicines for germs.  Drink enough fluid to keep your pee pale yellow. This information is not intended to replace advice given to you by your health care provider. Make sure you discuss any questions you have with your health care provider. Document Released: 04/09/2008 Document Revised: 10/09/2018 Document Reviewed: 05/01/2018 [employer]vier Patient Education  2020 [employer]  

## 2019-06-10 ENCOUNTER — Ambulatory Visit: Payer: Self-pay | Admitting: Family Medicine

## 2019-06-24 ENCOUNTER — Other Ambulatory Visit: Payer: Self-pay

## 2019-06-25 ENCOUNTER — Ambulatory Visit (INDEPENDENT_AMBULATORY_CARE_PROVIDER_SITE_OTHER): Payer: Medicare Other | Admitting: Family Medicine

## 2019-06-25 ENCOUNTER — Encounter: Payer: Self-pay | Admitting: Family Medicine

## 2019-06-25 VITALS — BP 150/76 | HR 98 | Temp 97.9°F | Ht 64.0 in | Wt 220.0 lb

## 2019-06-25 DIAGNOSIS — E119 Type 2 diabetes mellitus without complications: Secondary | ICD-10-CM | POA: Diagnosis not present

## 2019-06-25 DIAGNOSIS — E1165 Type 2 diabetes mellitus with hyperglycemia: Secondary | ICD-10-CM

## 2019-06-25 DIAGNOSIS — E1169 Type 2 diabetes mellitus with other specified complication: Secondary | ICD-10-CM | POA: Diagnosis not present

## 2019-06-25 DIAGNOSIS — E1159 Type 2 diabetes mellitus with other circulatory complications: Secondary | ICD-10-CM

## 2019-06-25 DIAGNOSIS — R300 Dysuria: Secondary | ICD-10-CM | POA: Diagnosis not present

## 2019-06-25 DIAGNOSIS — I10 Essential (primary) hypertension: Secondary | ICD-10-CM

## 2019-06-25 DIAGNOSIS — F418 Other specified anxiety disorders: Secondary | ICD-10-CM | POA: Diagnosis not present

## 2019-06-25 DIAGNOSIS — E785 Hyperlipidemia, unspecified: Secondary | ICD-10-CM

## 2019-06-25 DIAGNOSIS — Z79899 Other long term (current) drug therapy: Secondary | ICD-10-CM | POA: Diagnosis not present

## 2019-06-25 LAB — URINALYSIS
Bilirubin, UA: NEGATIVE
Glucose, UA: NEGATIVE
Ketones, UA: NEGATIVE
Leukocytes,UA: NEGATIVE
Nitrite, UA: NEGATIVE
Protein,UA: NEGATIVE
RBC, UA: NEGATIVE
Specific Gravity, UA: 1.01 (ref 1.005–1.030)
Urobilinogen, Ur: 0.2 mg/dL (ref 0.2–1.0)
pH, UA: 5 (ref 5.0–7.5)

## 2019-06-25 LAB — BAYER DCA HB A1C WAIVED: HB A1C (BAYER DCA - WAIVED): 8.8 % — ABNORMAL HIGH (ref <7.0)

## 2019-06-25 MED ORDER — FLUCONAZOLE 150 MG PO TABS: 150.0000 mg | ORAL_TABLET | Freq: Once | ORAL | 0 refills | Status: AC

## 2019-06-25 MED ORDER — GABAPENTIN 300 MG PO CAPS: 300.0000 mg | ORAL_CAPSULE | Freq: Three times a day (TID) | ORAL | 2 refills | Status: DC

## 2019-06-25 MED ORDER — HYDROCHLOROTHIAZIDE 25 MG PO TABS: 25.0000 mg | ORAL_TABLET | Freq: Every day | ORAL | 3 refills | Status: DC

## 2019-06-25 MED ORDER — LANTUS SOLOSTAR 100 UNIT/ML ~~LOC~~ SOPN: PEN_INJECTOR | SUBCUTANEOUS | 2 refills | Status: DC

## 2019-06-25 MED ORDER — INSULIN LISPRO (1 UNIT DIAL) 100 UNIT/ML (KWIKPEN): 15.0000 [IU] | PEN_INJECTOR | Freq: Three times a day (TID) | SUBCUTANEOUS | 2 refills | Status: DC

## 2019-06-25 MED ORDER — LORAZEPAM 1 MG PO TABS: 1.0000 mg | ORAL_TABLET | Freq: Two times a day (BID) | ORAL | 2 refills | Status: DC | PRN

## 2019-06-25 NOTE — Progress Notes (Signed)
Subjective: CC: DM2, HTN, GAD PCP: Janora Norlander, DO JAS:[id number] Diana Pratt is a 73 y.o. female presenting to clinic today for:  1. Type 2 Diabetes w/ HTN/ HLD:  Patient reports she continues to have fasting blood sugars around 150-160 in the morning.  High 190-200s.  No lows. She has been using Lantus 50 units every morning and 55 units every afternoon as well as Humalog 16 units with each meal.  She takes Januvia 100 mg, amlodipine, valsartan, metoprolol and hydrochlorothiazide.  Hx statin intolerance and therefore is not on cholesterol medication.  Last eye exam: due. No scheduled with [facility] due to [place] Last foot exam: UTD Last A1c:  Lab Results  Component Value Date   HGBA1C 6.8 12/09/2018   Nephropathy screen indicated?: on ARB Last flu, zoster and/or pneumovax:  Immunization History  Administered Date(s) Administered  . DTaP 01/04/2003  . Pneumococcal Polysaccharide-23 06/30/2018   ROS: Reports polyuria, polydipsia and some blurred vision.  Denies chest pain, shortness of breath, falls.  She reports burning with urination.  Denies any hematuria, fevers.  2. Anxiety Intermittent use of Ativan nightly.  She reports increased anxiety surrounding her 2 elderly aunts who are currently in nursing homes.  She notes she has not seen him since March but found out that multiple staff members have come down with COVID-19.  Denies any falls, dizziness, respiratory depression or confusion.  ROS: Per HPI  Allergies  Allergen Reactions  . Crestor [Rosuvastatin]     Myalgias   . Lipitor [Atorvastatin] Other (See Comments)    Myalgias   . Livalo [Pitavastatin] Other (See Comments)    myalgias  . Morphine And Related Other (See Comments)    Burning, itching  . Penicillins Hives and Itching  . Simvastatin Other (See Comments)    myalgias  . Sulfa Antibiotics Hives and Itching  . Zetia [Ezetimibe] Cough   Past Medical History:  Diagnosis Date  . Anxiety   . Asthma    . Back pain   . 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 COPD (chronic obstructive pulmonary disease) ([employer])    dr. Kenn File  . Depression   . Diabetes mellitus   . Diabetic neuropathy ([place])   . GERD (gastroesophageal reflux disease)    occ heartburn  . Humerus fracture    right  . Hyperlipemia   . Hypertension   . Interstitial cystitis   . Osteoporosis   . Pneumonia    15 yrs ago  . PONV (postoperative nausea and vomiting)   . Shortness of breath dyspnea     Current Outpatient Medications:  .  ACCU-CHEK AVIVA PLUS test strip, Use to check BG up to tid.  Dx: type 2 diabetes requiring insulin therapy E11.40, Disp: 300 each, Rfl: 1 .  ACCU-CHEK SOFTCLIX LANCETS lancets, 1 each by Other route See admin instructions. Check blood sugar 3 times daily, Disp: , Rfl:  .  albuterol (PROAIR HFA) 108 (90 Base) MCG/ACT inhaler, Inhale 2 puffs into the lungs every 6 (six) hours as needed for shortness of breath., Disp: 18 g, Rfl: 1 .  amLODipine (NORVASC) 10 MG tablet, Take 1 tablet (10 mg total) by mouth daily., Disp: 90 tablet, Rfl: 3 .  aspirin 81 MG EC tablet, Take 81 mg by mouth daily.  , Disp: , Rfl:  .  blood glucose meter kit and supplies KIT, Dispense based on patient and insurance preference. Use up  to tid (for Dx: type 2 DM, with insulin therapy E11.65, Z79.4), Disp: 1 each, Rfl: 0 .  budesonide-formoterol (SYMBICORT) 160-4.5 MCG/ACT inhaler, Inhale 2 puffs into the lungs 2 (two) times daily., Disp: 3 Inhaler, Rfl: 1 .  calcium carbonate 200 MG capsule, Take 250 mg by mouth daily. , Disp: , Rfl:  .  Cholecalciferol (VITAMIN D3) 2000 UNITS capsule, Take 2,000 Units by mouth daily. , Disp: 60 capsule, Rfl: 11 .  FLUoxetine (PROZAC) 10 MG capsule, Take 1 capsule (10 mg total) by mouth every other day., Disp: 45 capsule, Rfl: 1 .  fluticasone (FLONASE) 50 MCG/ACT nasal spray, Place 2 sprays into the nose daily as needed for allergies. Congestion , Disp: ,  Rfl:  .  gabapentin (NEURONTIN) 300 MG capsule, Take 1 capsule (300 mg total) by mouth 3 (three) times daily., Disp: 90 capsule, Rfl: 2 .  hydrochlorothiazide (HYDRODIURIL) 25 MG tablet, Take 1 tablet (25 mg total) by mouth daily., Disp: 90 tablet, Rfl: 3 .  hydroxypropyl methylcellulose (ISOPTO TEARS) 2.5 % ophthalmic solution, Place 1 drop into both eyes daily as needed. for dry eyes , Disp: , Rfl:  .  ibuprofen (ADVIL,MOTRIN) 200 MG tablet, Take 800 mg by mouth as needed., Disp: , Rfl:  .  Insulin Glargine (LANTUS SOLOSTAR) 100 UNIT/ML Solostar Pen, Inject 50 units every morning and 55 units every evening, Disp: 90 mL, Rfl: 4 .  insulin lispro (HUMALOG KWIKPEN) 100 UNIT/ML KwikPen, Inject 0.15 mLs (15 Units total) into the skin 3 (three) times daily with meals., Disp: 15 mL, Rfl: 2 .  Insulin Pen Needle (PEN NEEDLES) 31G X 6 MM MISC, Use to administer insulin once qid. Dx E11.59 Use Leader brand, Disp: 100 each, Rfl: 1 .  LORazepam (ATIVAN) 1 MG tablet, Take 1 tablet (1 mg total) by mouth 2 (two) times daily as needed for anxiety., Disp: 45 tablet, Rfl: 2 .  metoprolol succinate (TOPROL-XL) 100 MG 24 hr tablet, Take 1 tablet (100 mg total) by mouth daily. Take with or immediately following a meal., Disp: 90 tablet, Rfl: 3 .  Multiple Vitamins-Minerals (CENTRUM SILVER PO), Take 1 tablet by mouth daily. , Disp: , Rfl:  .  sitaGLIPtin (JANUVIA) 100 MG tablet, Take 100 mg by mouth daily., Disp: , Rfl:  .  valsartan (DIOVAN) 320 MG tablet, Take 1 tablet (320 mg total) by mouth daily., Disp: 90 tablet, Rfl: 3 Social History   Socioeconomic History  . Marital status: Married    Spouse name: Not on file  . Number of children: Not on file  . Years of education: Not on file  . Highest education level: Not on file  Occupational History  . Not on file  Social Needs  . Financial resource strain: Not on file  . Food insecurity    Worry: Not on file    Inability: Not on file  . Transportation needs     Medical: Not on file    Non-medical: Not on file  Tobacco Use  . Smoking status: Former Smoker    Packs/day: 0.50    Years: 34.00    Pack years: 17.00    Types: Cigarettes    Quit date: 06/01/1994    Years since quitting: 25.0  . Smokeless tobacco: Never Used  Substance and Sexual Activity  . Alcohol use: No  . Drug use: No  . Sexual activity: Never    Birth control/protection: Surgical    Comment: hyst  Lifestyle  . Physical activity  Days per week: Not on file    Minutes per session: Not on file  . Stress: Not on file  Relationships  . Social Herbalist on phone: Not on file    Gets together: Not on file    Attends religious service: Not on file    Active member of club or organization: Not on file    Attends meetings of clubs or organizations: Not on file    Relationship status: Not on file  . Intimate partner violence    Fear of current or ex partner: Not on file    Emotionally abused: Not on file    Physically abused: Not on file    Forced sexual activity: Not on file  Other Topics Concern  . Not on file  Social History Narrative   RETIRED: WORKS AS A CNA AT [employer]   1 DAUGHTER-IN [place]   O GRANDKIDS   DRINKS SOCIALLY   Family History  Problem Relation Age of Onset  . Parkinsonism Mother   . Dementia Mother   . Colon cancer Father 53       MULTIPLE CO-MORBIDITIES  . Cancer Father   . Diabetes Paternal Aunt   . Diabetes Paternal Uncle   . Diabetes Paternal Grandmother   . Congestive Heart Failure Paternal Grandfather   . Congestive Heart Failure Maternal Grandmother   . Stroke Maternal Grandfather   . Colon polyps Neg Hx     Objective: Office vital signs reviewed. BP (!) 150/76   Pulse 98   Temp 97.9 F (36.6 C) (Temporal)   Ht '5\' 4"'  (1.626 m)   Wt 220 lb (99.8 kg)   SpO2 95%   BMI 37.76 kg/m   Physical Examination:  General: Awake, alert, obese, No acute distress Cardio: regular rate and rhythm, S1S2 heard, no  murmurs appreciated Pulm: clear to auscultation bilaterally, no wheezes, rhonchi or rales; normal work of breathing on room air GU: no suprapubic TTP Extremities: warm, well perfused, No edema, cyanosis or clubbing; +2 pulses bilaterally Psych: Mood stable, speech normal, affect appropriate, pleasant and interactive.  Good eye contact Depression screen [facility] 2/9 06/25/2019 12/09/2018 09/08/2018  Decreased Interest 1 0 0  Down, Depressed, Hopeless 1 0 0  PHQ - 2 Score 2 0 0  Altered sleeping 1 0 -  Tired, decreased energy 1 1 -  Change in appetite 0 0 -  Feeling bad or failure about yourself  1 0 -  Trouble concentrating 0 0 -  Moving slowly or fidgety/restless 0 0 -  Suicidal thoughts 0 0 -  PHQ-9 Score 5 1 -  Difficult doing work/chores Somewhat difficult - -  Some recent data might be hidden   GAD 7 : Generalized Anxiety Score 06/25/2019 12/09/2018 09/08/2018 08/11/2018  Nervous, Anxious, on Edge 2 0 0 1  Control/stop worrying 2 0 0 0  Worry too much - different things 2 0 0 0  Trouble relaxing 1 0 1 1  Restless 0 0 1 0  Easily annoyed or irritable '1 1 1 1  ' Afraid - awful might happen 1 0 0 0  Total GAD 7 Score '9 1 3 3  ' Anxiety Difficulty Somewhat difficult Not difficult at all Somewhat difficult Somewhat difficult   Assessment/ Plan: 73 y.o. female   1. Uncontrolled type 2 diabetes mellitus with hyperglycemia (HCC) A1c has gone up quite a bit since her last visit.  Today's reading was 8.8.  This is up from 6.8.  I  have advised her to increase her morning insulin to 55 units.  Continue 55 units nightly.  If fasting blood sugars persistently above 140 in the morning after 2 weeks on this regimen, she will increase Lantus by 1 unit daily 2 days.  May need to consider increasing mealtime insulin.  Unfortunately she did not bring a sugar log so it is difficult to tell what her true readings are.  I have asked her to check her blood sugar every morning and 2 hours after each meal and log.  We  will review this log in 4 weeks.  She is to scheduled DM eye exam. - Bayer DCA Hb A1c Waived - CMP14+EGFR - gabapentin (NEURONTIN) 300 MG capsule; Take 1 capsule (300 mg total) by mouth 3 (three) times daily.  Dispense: 90 capsule; Refill: 2 - Insulin Glargine (LANTUS SOLOSTAR) 100 UNIT/ML Solostar Pen; Inject 50-55 units every morning and 55-60 units every evening  Dispense: 105 mL; Refill: 2  2. Hypertension associated with diabetes ([place]) Not at goal today.  She has not taken her blood pressure medications today. - CMP14+EGFR  3. Hyperlipidemia associated with type 2 diabetes mellitus ([place]) Intolerant to statins previously.  May need to consider alternative like Vascepa or Zetia - CMP14+EGFR - Lipid Panel  4. Depression with anxiety Not well controlled but she is reporting excessive worry surrounding her elderly aunts that have had possible COVID exposure.  [place] narcotic database was reviewed and there were no red flags. - LORazepam (ATIVAN) 1 MG tablet; Take 1 tablet (1 mg total) by mouth 2 (two) times daily as needed for anxiety.  Dispense: 45 tablet; Refill: 2  5. Controlled substance agreement signed UDS cannot be obtained today secondary to insufficient urine sample.  Her urine was used to perform a urine dip given dysuria  6. Dysuria No evidence of UTI but she has 3+ glucose on urine dip.  I wonder if she is having a yeast breakout given elevated blood sugars.  I sent in Diflucan - Urinalysis   Orders Placed This Encounter  Procedures  . Bayer DCA Hb A1c Waived  . CMP14+EGFR  . Lipid Panel  . Urinalysis   Meds ordered this encounter  Medications  . gabapentin (NEURONTIN) 300 MG capsule    Sig: Take 1 capsule (300 mg total) by mouth 3 (three) times daily.    Dispense:  90 capsule    Refill:  2  . hydrochlorothiazide (HYDRODIURIL) 25 MG tablet    Sig: Take 1 tablet (25 mg total) by mouth daily.    Dispense:  90 tablet    Refill:  3  . insulin lispro (HUMALOG KWIKPEN)  100 UNIT/ML KwikPen    Sig: Inject 0.15 mLs (15 Units total) into the skin 3 (three) times daily with meals.    Dispense:  15 mL    Refill:  2  . LORazepam (ATIVAN) 1 MG tablet    Sig: Take 1 tablet (1 mg total) by mouth 2 (two) times daily as needed for anxiety.    Dispense:  45 tablet    Refill:  2    Please place on hold.  . Insulin Glargine (LANTUS SOLOSTAR) 100 UNIT/ML Solostar Pen    Sig: Inject 50-55 units every morning and 55-60 units every evening    Dispense:  105 mL    Refill:  2  . fluconazole (DIFLUCAN) 150 MG tablet    Sig: Take 1 tablet (150 mg total) by mouth once for 1 dose.  Dispense:  1 tablet    Refill:  [place], DO [place] [phone]

## 2019-06-25 NOTE — Patient Instructions (Addendum)
Your diabetic eye exam is overdue.  Please get this exam completed and have the results sent to the office.  Your mammogram is overdue.  Please have this exam scheduled.  Flu shots will be available in October.  Please schedule your flu shot.  Keep a sugar log.  Have your blood pressure reading ready for me in 4 weeks. We will do a phone visit.  For your sugar: Increase your morning Lantus to 55 units.  Continue 55 units at bedtime. If your blood sugar stays above 140 in the morning, I want you to start increasing the Lantus (this can be the evening dose to start) by 1 unit every 2 days.  Diabetes Mellitus and Standards of Medical Care Managing diabetes (diabetes mellitus) can be complicated. Your diabetes treatment may be managed by a team of health care providers, including:  A physician who specializes in diabetes (endocrinologist).  A nurse practitioner or physician assistant.  Nurses.  A diet and nutrition specialist (registered dietitian).  A certified diabetes educator (CDE).  An exercise specialist.  A pharmacist.  An eye doctor.  A foot specialist (podiatrist).  A dentist.  A primary care provider.  A mental health provider. Your health care providers follow guidelines to help you get the best quality of care. The following schedule is a general guideline for your diabetes management plan. Your health care providers may give you more specific instructions. Physical exams Upon being diagnosed with diabetes mellitus, and each year after that, your health care provider will ask about your medical and family history. He or she will also do a physical exam. Your exam may include:  Measuring your height, weight, and body mass index (BMI).  Checking your blood pressure. This will be done at every routine medical visit. Your target blood pressure may vary depending on your medical conditions, your age, and other factors.  Thyroid gland exam.  Skin exam.  Screening  for damage to your nerves (peripheral neuropathy). This may include checking the pulse in your legs and feet and checking the level of sensation in your hands and feet.  A complete foot exam to inspect the structure and skin of your feet, including checking for cuts, bruises, redness, blisters, sores, or other problems.  Screening for blood vessel (vascular) problems, which may include checking the pulse in your legs and feet and checking your temperature. Blood tests Depending on your treatment plan and your personal needs, you may have the following tests done:  HbA1c (hemoglobin A1c). This test provides information about blood sugar (glucose) control over the previous 2-3 months. It is used to adjust your treatment plan, if needed. This test will be done: ? At least 2 times a year, if you are meeting your treatment goals. ? 4 times a year, if you are not meeting your treatment goals or if treatment goals have changed.  Lipid testing, including total, LDL, and HDL cholesterol and triglyceride levels. ? The goal for LDL is less than 100 mg/dL (5.5 mmol/L). If you are at high risk for complications, the goal is less than 70 mg/dL (3.9 mmol/L). ? The goal for HDL is 40 mg/dL (2.2 mmol/L) or higher for men and 50 mg/dL (2.8 mmol/L) or higher for women. An HDL cholesterol of 60 mg/dL (3.3 mmol/L) or higher gives some protection against heart disease. ? The goal for triglycerides is less than 150 mg/dL (8.3 mmol/L).  Liver function tests.  Kidney function tests.  Thyroid function tests. Dental and eye exams  Visit your dentist two times a year.  If you have type 1 diabetes, your health care provider may recommend an eye exam 3-5 years after you are diagnosed, and then once a year after your first exam. ? For children with type 1 diabetes, a health care provider may recommend an eye exam when your child is age 107 or older and has had diabetes for 3-5 years. After the first exam, your child  should get an eye exam once a year.  If you have type 2 diabetes, your health care provider may recommend an eye exam as soon as you are diagnosed, and then once a year after your first exam. Immunizations   The yearly flu (influenza) vaccine is recommended for everyone 6 months or older who has diabetes.  The pneumonia (pneumococcal) vaccine is recommended for everyone 2 years or older who has diabetes. If you are 10 or older, you may get the pneumonia vaccine as a series of two separate shots.  The hepatitis B vaccine is recommended for adults shortly after being diagnosed with diabetes.  Adults and children with diabetes should receive all other vaccines according to age-specific recommendations from the Centers for Disease Control and Prevention (CDC). Mental and emotional health Screening for symptoms of eating disorders, anxiety, and depression is recommended at the time of diagnosis and afterward as needed. If your screening shows that you have symptoms (positive screening result), you may need more evaluation and you may work with a mental health care provider. Treatment plan Your treatment plan will be reviewed at every medical visit. You and your health care provider will discuss:  How you are taking your medicines, including insulin.  Any side effects you are experiencing.  Your blood glucose target goals.  The frequency of your blood glucose monitoring.  Lifestyle habits, such as activity level as well as tobacco, alcohol, and substance use. Diabetes self-management education Your health care provider will assess how well you are monitoring your blood glucose levels and whether you are taking your insulin correctly. He or she may refer you to:  A certified diabetes educator to manage your diabetes throughout your life, starting at diagnosis.  A registered dietitian who can create or review your personal nutrition plan.  An exercise specialist who can discuss your  activity level and exercise plan. Summary  Managing diabetes (diabetes mellitus) can be complicated. Your diabetes treatment may be managed by a team of health care providers.  Your health care providers follow guidelines in order to help you get the best quality of care.  Standards of care including having regular physical exams, blood tests, blood pressure monitoring, immunizations, screening tests, and education about how to manage your diabetes.  Your health care providers may also give you more specific instructions based on your individual health. This information is not intended to replace advice given to you by your health care provider. Make sure you discuss any questions you have with your health care provider. Document Released: 08/19/2009 Document Revised: 07/11/2018 Document Reviewed: 07/20/2016 Elsevier Patient Education  2020 [employer].

## 2019-06-26 LAB — CMP14+EGFR
ALT: 60 IU/L — ABNORMAL HIGH (ref 0–32)
AST: 58 IU/L — ABNORMAL HIGH (ref 0–40)
Albumin/Globulin Ratio: 1.4 (ref 1.2–2.2)
Albumin: 4.2 g/dL (ref 3.7–4.7)
Alkaline Phosphatase: 91 IU/L (ref 39–117)
BUN/Creatinine Ratio: 13 (ref 12–28)
BUN: 12 mg/dL (ref 8–27)
Bilirubin Total: 0.3 mg/dL (ref 0.0–1.2)
CO2: 22 mmol/L (ref 20–29)
Calcium: 9.4 mg/dL (ref 8.7–10.3)
Chloride: 101 mmol/L (ref 96–106)
Creatinine, Ser: 0.96 mg/dL (ref 0.57–1.00)
GFR calc Af Amer: 68 mL/min/{1.73_m2} (ref >59)
GFR calc non Af Amer: 59 mL/min/{1.73_m2} — ABNORMAL LOW (ref >59)
Globulin, Total: 2.9 g/dL (ref 1.5–4.5)
Glucose: 190 mg/dL — ABNORMAL HIGH (ref 65–99)
Potassium: 4.6 mmol/L (ref 3.5–5.2)
Sodium: 141 mmol/L (ref 134–144)
Total Protein: 7.1 g/dL (ref 6.0–8.5)

## 2019-06-26 LAB — LIPID PANEL
Chol/HDL Ratio: 7.8 ratio — ABNORMAL HIGH (ref 0.0–4.4)
Cholesterol, Total: 242 mg/dL — ABNORMAL HIGH (ref 100–199)
HDL: 31 mg/dL — ABNORMAL LOW (ref >39)
LDL Calculated: 159 mg/dL — ABNORMAL HIGH (ref 0–99)
Triglycerides: 258 mg/dL — ABNORMAL HIGH (ref 0–149)
VLDL Cholesterol Cal: 52 mg/dL — ABNORMAL HIGH (ref 5–40)

## 2019-06-29 ENCOUNTER — Encounter: Payer: Self-pay | Admitting: Gastroenterology

## 2019-07-02 ENCOUNTER — Telehealth: Payer: Self-pay | Admitting: Family Medicine

## 2019-07-02 MED ORDER — ACCU-CHEK AVIVA PLUS W/DEVICE KIT: PACK | 0 refills | Status: DC

## 2019-07-02 MED ORDER — ACCU-CHEK SOFTCLIX LANCETS MISC: 3 refills | Status: DC

## 2019-07-02 MED ORDER — ACCU-CHEK AVIVA PLUS VI STRP: ORAL_STRIP | 3 refills | Status: DC

## 2019-07-02 NOTE — Telephone Encounter (Signed)
Pt aware sent to pharmacy 

## 2019-07-06 ENCOUNTER — Ambulatory Visit (INDEPENDENT_AMBULATORY_CARE_PROVIDER_SITE_OTHER): Payer: Medicare Other | Admitting: *Deleted

## 2019-07-06 VITALS — BP 150/76 | Ht 64.0 in | Wt 220.0 lb

## 2019-07-06 DIAGNOSIS — Z0000 Encounter for general adult medical examination without abnormal findings: Secondary | ICD-10-CM

## 2019-07-06 NOTE — Progress Notes (Signed)
MEDICARE ANNUAL WELLNESS VISIT  07/06/2019  Telephone Visit Disclaimer This Medicare AWV was conducted by telephone due to national recommendations for restrictions regarding the COVID-19 Pandemic (e.g. social distancing).  I verified, using two identifiers, that I am speaking with Diana Pratt or their authorized healthcare agent. I discussed the limitations, risks, security, and privacy concerns of performing an evaluation and management service by telephone and the potential availability of an in-person appointment in the future. The patient expressed understanding and agreed to proceed.   Subjective:  Diana Pratt is a 73 y.o. female patient of Janora Norlander, DO who had a Medicare Annual Wellness Visit today via telephone. Diana Pratt is Retired and lives with their spouse. she has 1 child. she reports that she is socially active and does interact with friends/family regularly. she is minimally physically active and enjoys reading.  Patient Care Team: Janora Norlander, DO as PCP - General (Family Medicine) Danie Binder, MD as Consulting Physician (Gastroenterology) Druscilla Brownie, MD as Consulting Physician (Dermatology) Justice Britain, MD as Consulting Physician (Orthopedic Surgery) Estill Dooms, NP as Nurse Practitioner (Obstetrics and Gynecology)  Advanced Directives 07/06/2019 06/05/2017 04/10/2017 02/11/2017 06/27/2016 06/21/2016 06/06/2016  Does Patient Have a Medical Advance Directive? No No No No No No No  Would patient like information on creating a medical advance directive? No - Patient declined Yes (MAU/Ambulatory/Procedural Areas - Information given) Yes (MAU/Ambulatory/Procedural Areas - Information given) No - Patient declined - No - patient declined information -    Hospital Utilization Over the Past 12 Months: # of hospitalizations or ER visits: 0 # of surgeries: 0  Review of Systems    Patient reports that her overall health is unchanged compared to  last year.  General ROS: negative  Patient Reported Readings (BP, Pulse, CBG, Weight, etc) BP (!) 150/76 Comment: home reading  Ht '5\' 4"'  (1.626 m)   Wt 220 lb (99.8 kg)   BMI 37.76 kg/m    Pain Assessment       Current Medications & Allergies (verified) Allergies as of 07/06/2019      Reactions   Crestor [rosuvastatin]    Myalgias   Lipitor [atorvastatin] Other (See Comments)   Myalgias   Livalo [pitavastatin] Other (See Comments)   myalgias   Morphine And Related Other (See Comments)   Burning, itching   Penicillins Hives, Itching   Simvastatin Other (See Comments)   myalgias   Sulfa Antibiotics Hives, Itching   Zetia [ezetimibe] Cough      Medication List       Accurate as of July 06, 2019  3:44 PM. If you have any questions, ask your nurse or doctor.        STOP taking these medications   blood glucose meter kit and supplies Kit     TAKE these medications   Accu-Chek Aviva Plus test strip Generic drug: glucose blood Use to check BG up to tid.  Dx: type 2 diabetes requiring insulin therapy E11.40   Accu-Chek Aviva Plus w/Device Kit Check blood glucose TID Dx E11.40   Accu-Chek Softclix Lancets lancets Check blood sugar 3 times daily Dx E11.40   albuterol 108 (90 Base) MCG/ACT inhaler Commonly known as: ProAir HFA Inhale 2 puffs into the lungs every 6 (six) hours as needed for shortness of breath.   amLODipine 10 MG tablet Commonly known as: NORVASC Take 1 tablet (10 mg total) by mouth daily.   aspirin 81 MG EC tablet Take 81 mg  by mouth daily.   budesonide-formoterol 160-4.5 MCG/ACT inhaler Commonly known as: SYMBICORT Inhale 2 puffs into the lungs 2 (two) times daily.   calcium carbonate 200 MG capsule Take 250 mg by mouth daily.   CENTRUM SILVER PO Take 1 tablet by mouth daily.   FLUoxetine 10 MG capsule Commonly known as: PROzac Take 1 capsule (10 mg total) by mouth every other day.   fluticasone 50 MCG/ACT nasal spray Commonly  known as: FLONASE Place 2 sprays into the nose daily as needed for allergies. Congestion   gabapentin 300 MG capsule Commonly known as: NEURONTIN Take 1 capsule (300 mg total) by mouth 3 (three) times daily.   hydrochlorothiazide 25 MG tablet Commonly known as: HYDRODIURIL Take 1 tablet (25 mg total) by mouth daily.   hydroxypropyl methylcellulose / hypromellose 2.5 % ophthalmic solution Commonly known as: ISOPTO TEARS / GONIOVISC Place 1 drop into both eyes daily as needed. for dry eyes   ibuprofen 200 MG tablet Commonly known as: ADVIL Take 800 mg by mouth as needed.   insulin lispro 100 UNIT/ML KwikPen Commonly known as: HumaLOG KwikPen Inject 0.15 mLs (15 Units total) into the skin 3 (three) times daily with meals.   Lantus SoloStar 100 UNIT/ML Solostar Pen Generic drug: Insulin Glargine Inject 50-55 units every morning and 55-60 units every evening   LORazepam 1 MG tablet Commonly known as: ATIVAN Take 1 tablet (1 mg total) by mouth 2 (two) times daily as needed for anxiety.   metoprolol succinate 100 MG 24 hr tablet Commonly known as: TOPROL-XL Take 1 tablet (100 mg total) by mouth daily. Take with or immediately following a meal.   Pen Needles 31G X 6 MM Misc Use to administer insulin once qid. Dx E11.59 Use Leader brand   sitaGLIPtin 100 MG tablet Commonly known as: JANUVIA Take 100 mg by mouth daily.   valsartan 320 MG tablet Commonly known as: DIOVAN Take 1 tablet (320 mg total) by mouth daily.   Vitamin D3 50 MCG (2000 UT) capsule Take 2,000 Units by mouth daily.       History (reviewed): Past Medical History:  Diagnosis Date  . Anxiety   . Asthma   . Back pain   . 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 COPD (chronic obstructive pulmonary disease) ([place])    dr. Kenn File  . Depression   . Diabetes mellitus   . Diabetic neuropathy ([place])   . GERD (gastroesophageal reflux disease)    occ heartburn  .  Humerus fracture    right  . Hyperlipemia   . Hypertension   . Interstitial cystitis   . Osteoporosis   . Pneumonia    15 yrs ago  . PONV (postoperative nausea and vomiting)   . Shortness of breath dyspnea    Past Surgical History:  Procedure Laterality Date  . ABDOMINAL HYSTERECTOMY  03/1984   partial  . COLONOSCOPY N/A 07/19/2014   Procedure: COLONOSCOPY;  Surgeon: Danie Binder, MD;  [facility];  Service: Endoscopy;  Laterality: N/A;  9:30  . REVERSE SHOULDER ARTHROPLASTY Right 06/14/2016   Procedure: REVERSE SHOULDER ARTHROPLASTY;  Surgeon: Justice Britain, MD;  Location: [facility];  Service: Orthopedics;  Laterality: Right;  . Skin cancer removed  08/07/2016   left ear   Family History  Problem Relation Age of Onset  . Parkinsonism Mother   . Dementia Mother   . Colon cancer Father 39  MULTIPLE CO-MORBIDITIES  . Cancer Father   . Diabetes Paternal Aunt   . Diabetes Paternal Uncle   . Diabetes Paternal Grandmother   . Congestive Heart Failure Paternal Grandfather   . Congestive Heart Failure Maternal Grandmother   . Stroke Maternal Grandfather   . Colon polyps Neg Hx    Social History   Socioeconomic History  . Marital status: Married    Spouse name: [name]  . Number of children: 1  . Years of education: Not on file  . Highest education level: Not on file  Occupational History  . 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 Financial resource strain: Not on file  . Food insecurity    Worry: Not on file    Inability: Not on file  . Transportation needs    Medical: Not on file    Non-medical: Not on file  Tobacco Use  . Smoking status: Former Smoker    Packs/day: 0.50    Years: 34.00    Pack years: 17.00    Types: Cigarettes    Quit date: 06/01/1994    Years since quitting: 25.1  . Smokeless tobacco: Never Used  Substance and Sexual Activity  . Alcohol use: No  . Drug use: No  . Sexual activity: Not on file    Comment: hyst   Lifestyle  . Physical activity    Days per week: Not on file    Minutes per session: Not on file  . Stress: Not on file  Relationships  . Social Herbalist on phone: Not on file    Gets together: Not on file    Attends religious service: Not on file    Active member of club or organization: Not on file    Attends meetings of clubs or organizations: Not on file    Relationship status: Not on file  Other Topics Concern  . Not on file  Social History Narrative   RETIRED: WORKS AS A CNA AT [employer]   1 DAUGHTER-IN [place]    Activities of Daily Living In your present state of health, do you have any difficulty performing the following activities: 07/06/2019  Hearing? N  Vision? Y  Comment RX glasses  Difficulty concentrating or making decisions? N  Walking or climbing stairs? N  Dressing or bathing? N  Doing errands, shopping? N  Preparing Food and eating ? N  Using the Toilet? N  In the past six months, have you accidently leaked urine? N  Do you have problems with loss of bowel control? N  Managing your Medications? N  Managing your Finances? N  Housekeeping or managing your Housekeeping? N  Some recent data might be hidden    Patient Education/ Literacy    Exercise Current Exercise Habits: Home exercise routine, Type of exercise: stretching;walking, Time (Minutes): 15, Frequency (Times/Week): 3, Weekly Exercise (Minutes/Week): 45, Intensity: Mild, Exercise limited by: None identified  Diet Patient reports consuming 2 meals a day and 1 snack(s) a day Patient reports that her primary diet is: Regular Patient reports that she does have regular access to food.   Depression Screen PHQ 2/9 Scores 07/06/2019 06/25/2019 12/09/2018 09/08/2018 08/11/2018 06/30/2018 03/14/2018  PHQ - 2 Score 0 2 0 0 0 0 0  PHQ- 9 Score - 5 1 - - - -     Fall Risk Fall Risk  07/06/2019 06/25/2019 12/09/2018 09/08/2018 03/14/2018  Falls  in the past  year? 0 0 0 0 No  Number falls in past yr: - - - - -  Injury with Fall? - - - - -  Risk for fall due to : - - - - -  Follow up - - - - -     Objective:  Diana Pratt seemed alert and oriented and she participated appropriately during our telephone visit.  Blood Pressure Weight BMI  BP Readings from Last 3 Encounters:  07/06/19 (!) 150/76  06/25/19 (!) 150/76  12/09/18 140/68   Wt Readings from Last 3 Encounters:  07/06/19 220 lb (99.8 kg)  06/25/19 220 lb (99.8 kg)  12/09/18 222 lb (100.7 kg)   BMI Readings from Last 1 Encounters:  07/06/19 37.76 kg/m    *Unable to obtain current vital signs, weight, and BMI due to telephone visit type  Hearing/Vision  . Diana Pratt did not seem to have difficulty with hearing/understanding during the telephone conversation . Reports that she has had a formal eye exam by an eye care professional within the past year . Reports that she has not had a formal hearing evaluation within the past year *Unable to fully assess hearing and vision during telephone visit type  Cognitive Function: 6CIT Screen 07/06/2019  What Year? 0 points  What month? 0 points  What time? 0 points  Count back from 20 0 points  Months in reverse 0 points  Repeat phrase 2 points  Total Score 2   (Normal:0-7, Significant for Dysfunction: >8)  Normal Cognitive Function Screening: Yes   Immunization & Health Maintenance Record Immunization History  Administered Date(s) Administered  . DTaP 01/04/2003  . Pneumococcal Polysaccharide-23 06/30/2018    Health Maintenance  Topic Date Due  . TETANUS/TDAP  01/03/2013  . MAMMOGRAM  06/11/2014  . OPHTHALMOLOGY EXAM  12/23/2018  . INFLUENZA VACCINE  06/06/2019  . PNA vac Low Risk Adult (2 of 2 - PCV13) 07/01/2019  . FOOT EXAM  12/10/2019  . HEMOGLOBIN A1C  12/26/2019  . COLONOSCOPY  07/19/2024  . DEXA SCAN  Completed  . Hepatitis C Screening  Completed       Assessment  This is a routine wellness examination  for Diana Pratt.  Health Maintenance: Due or Overdue Health Maintenance Due  Topic Date Due  . TETANUS/TDAP  01/03/2013  . MAMMOGRAM  06/11/2014  . OPHTHALMOLOGY EXAM  12/23/2018  . INFLUENZA VACCINE  06/06/2019  . PNA vac Low Risk Adult (2 of 2 - PCV13) 07/01/2019    Diana Pratt does not need a referral for Community Assistance: Care Management:   no Social Work:    no Prescription Assistance:  no Nutrition/Diabetes Education:  no   Plan:  Personalized Goals Goals Addressed            This Visit's Progress   . Prevent falls       Stay active Work on eating a better diet.       Personalized Health Maintenance & Screening Recommendations  Pneumococcal vaccine  Td vaccine  Mammo  Lung Cancer Screening Recommended: no (Low Dose CT Chest recommended if Age 54-80 years, 30 pack-year currently smoking OR have quit w/in past 15 years) Hepatitis C Screening recommended: no HIV Screening recommended: no  Advanced Directives: Written information was not prepared per patient's request.  Referrals & Orders No orders of the defined types were placed in this encounter.   Follow-up Plan . Follow-up with Janora Norlander, DO as planned  I have personally reviewed and noted the following in the patient's chart:   . Medical and social history . Use of alcohol, tobacco or illicit drugs  . Current medications and supplements . Functional ability and status . Nutritional status . Physical activity . Advanced directives . List of other physicians . Hospitalizations, surgeries, and ER visits in previous 12 months . Vitals . Screenings to include cognitive, depression, and falls . Referrals and appointments  In addition, I have reviewed and discussed with Diana Pratt certain preventive protocols, quality metrics, and best practice recommendations. A written personalized care plan for preventive services as well as general preventive health recommendations  is available and can be mailed to the patient at her request.      [place] Dec  07/06/2019

## 2019-07-06 NOTE — Patient Instructions (Signed)
  Diana Pratt , Thank you for taking time to come for your Medicare Wellness Visit. I appreciate your ongoing commitment to your health goals. Please review the following plan we discussed and let me know if I can assist you in the future.   These are the goals we discussed: Goals    . Prevent falls     Stay active Work on eating a better diet.        This is a list of the screening recommended for you and due dates:  Health Maintenance  Topic Date Due  . Tetanus Vaccine  01/03/2013  . Mammogram  06/11/2014  . Eye exam for diabetics  12/23/2018  . Flu Shot  06/06/2019  . Pneumonia vaccines (2 of 2 - PCV13) 07/01/2019  . Complete foot exam   12/10/2019  . Hemoglobin A1C  12/26/2019  . Colon Cancer Screening  07/19/2024  . DEXA scan (bone density measurement)  Completed  .  Hepatitis C: One time screening is recommended by Center for Disease Control  (CDC) for  adults born from [birth year] through 1965.   Completed

## 2019-07-23 ENCOUNTER — Ambulatory Visit: Payer: Medicare Other | Admitting: Family Medicine

## 2019-07-28 ENCOUNTER — Ambulatory Visit (INDEPENDENT_AMBULATORY_CARE_PROVIDER_SITE_OTHER): Payer: Medicare Other | Admitting: Family Medicine

## 2019-07-28 DIAGNOSIS — J449 Chronic obstructive pulmonary disease, unspecified: Secondary | ICD-10-CM | POA: Diagnosis not present

## 2019-07-28 DIAGNOSIS — E1165 Type 2 diabetes mellitus with hyperglycemia: Secondary | ICD-10-CM

## 2019-07-28 NOTE — Progress Notes (Signed)
Telephone visit  Subjective: CC: Uncontrolled type 2 diabetes PCP: Janora Norlander, DO [id number] Diana Pratt is a 73 y.o. female calls for telephone consult today. Patient provides verbal consent for consult held via phone.  Location of patient: home Location of provider: Working remotely from home Others present for call: none  1.  Uncontrolled type 2 diabetes Patient was seen about 4 weeks ago for type 2 diabetes.  She had uncontrolled sugars at that time with A1c of 8.8.  She was instructed to increase her morning insulin to 55 units and continue 55 units nightly.  She was to increase her Lantus by 1 unit every 2 days for fasting blood sugar greater than 140.  She was also to monitor postprandial blood sugars.  We follow-up today for review of her 4-week sugar log.   FBGs: 115-122 Postprandial: 170-180 High: 200  No hypoglycemic episodes.  No polydipsia or polyuria.  No visual disturbance.  2. COPD She reports she has been coughing a little more and wheezing.  Symptoms are relieved by albuterol.  She reports compliance with Symbicort.  ROS: Per HPI  Allergies  Allergen Reactions  . Crestor [Rosuvastatin]     Myalgias   . Lipitor [Atorvastatin] Other (See Comments)    Myalgias   . Livalo [Pitavastatin] Other (See Comments)    myalgias  . Morphine And Related Other (See Comments)    Burning, itching  . Penicillins Hives and Itching  . Simvastatin Other (See Comments)    myalgias  . Sulfa Antibiotics Hives and Itching  . Zetia [Ezetimibe] Cough   Past Medical History:  Diagnosis Date  . Anxiety   . Asthma   . Back pain   . 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 COPD (chronic obstructive pulmonary disease) ([place])    dr. Kenn File  . Depression   . Diabetes mellitus   . Diabetic neuropathy ([place])   . GERD (gastroesophageal reflux disease)    occ heartburn  . Humerus fracture    right  . Hyperlipemia   . Hypertension   .  Interstitial cystitis   . Osteoporosis   . Pneumonia    15 yrs ago  . PONV (postoperative nausea and vomiting)   . Shortness of breath dyspnea     Current Outpatient Medications:  .  ACCU-CHEK AVIVA PLUS test strip, Use to check BG up to tid.  Dx: type 2 diabetes requiring insulin therapy E11.40, Disp: 300 each, Rfl: 3 .  Accu-Chek Softclix Lancets lancets, Check blood sugar 3 times daily Dx E11.40, Disp: 300 each, Rfl: 3 .  albuterol (PROAIR HFA) 108 (90 Base) MCG/ACT inhaler, Inhale 2 puffs into the lungs every 6 (six) hours as needed for shortness of breath., Disp: 18 g, Rfl: 1 .  amLODipine (NORVASC) 10 MG tablet, Take 1 tablet (10 mg total) by mouth daily., Disp: 90 tablet, Rfl: 3 .  aspirin 81 MG EC tablet, Take 81 mg by mouth daily.  , Disp: , Rfl:  .  Blood Glucose Monitoring Suppl (ACCU-CHEK AVIVA PLUS) w/Device KIT, Check blood glucose TID Dx E11.40, Disp: 1 kit, Rfl: 0 .  budesonide-formoterol (SYMBICORT) 160-4.5 MCG/ACT inhaler, Inhale 2 puffs into the lungs 2 (two) times daily., Disp: 3 Inhaler, Rfl: 1 .  calcium carbonate 200 MG capsule, Take 250 mg by mouth daily. , Disp: , Rfl:  .  Cholecalciferol (VITAMIN D3) 2000 UNITS capsule, Take 2,000 Units by mouth daily. , Disp:  60 capsule, Rfl: 11 .  FLUoxetine (PROZAC) 10 MG capsule, Take 1 capsule (10 mg total) by mouth every other day., Disp: 45 capsule, Rfl: 1 .  fluticasone (FLONASE) 50 MCG/ACT nasal spray, Place 2 sprays into the nose daily as needed for allergies. Congestion , Disp: , Rfl:  .  gabapentin (NEURONTIN) 300 MG capsule, Take 1 capsule (300 mg total) by mouth 3 (three) times daily., Disp: 90 capsule, Rfl: 2 .  hydrochlorothiazide (HYDRODIURIL) 25 MG tablet, Take 1 tablet (25 mg total) by mouth daily., Disp: 90 tablet, Rfl: 3 .  hydroxypropyl methylcellulose (ISOPTO TEARS) 2.5 % ophthalmic solution, Place 1 drop into both eyes daily as needed. for dry eyes , Disp: , Rfl:  .  ibuprofen (ADVIL,MOTRIN) 200 MG tablet, Take  800 mg by mouth as needed., Disp: , Rfl:  .  Insulin Glargine (LANTUS SOLOSTAR) 100 UNIT/ML Solostar Pen, Inject 50-55 units every morning and 55-60 units every evening, Disp: 105 mL, Rfl: 2 .  insulin lispro (HUMALOG KWIKPEN) 100 UNIT/ML KwikPen, Inject 0.15 mLs (15 Units total) into the skin 3 (three) times daily with meals., Disp: 15 mL, Rfl: 2 .  Insulin Pen Needle (PEN NEEDLES) 31G X 6 MM MISC, Use to administer insulin once qid. Dx E11.59 Use Leader brand, Disp: 100 each, Rfl: 1 .  LORazepam (ATIVAN) 1 MG tablet, Take 1 tablet (1 mg total) by mouth 2 (two) times daily as needed for anxiety., Disp: 45 tablet, Rfl: 2 .  metoprolol succinate (TOPROL-XL) 100 MG 24 hr tablet, Take 1 tablet (100 mg total) by mouth daily. Take with or immediately following a meal., Disp: 90 tablet, Rfl: 3 .  Multiple Vitamins-Minerals (CENTRUM SILVER PO), Take 1 tablet by mouth daily. , Disp: , Rfl:  .  sitaGLIPtin (JANUVIA) 100 MG tablet, Take 100 mg by mouth daily., Disp: , Rfl:  .  valsartan (DIOVAN) 320 MG tablet, Take 1 tablet (320 mg total) by mouth daily., Disp: 90 tablet, Rfl: 3  Assessment/ Plan: 73 y.o. female   1. Uncontrolled type 2 diabetes mellitus with hyperglycemia (HCC) Sugars sound like they are improving.  We will continue her current regimen with Lantus 55 units twice daily.  Continue Januvia and mealtime insulin.  We have scheduled a follow-up visit for an office check of A1c in November.  Patient aware of date and time.  2. Chronic obstructive pulmonary disease, unspecified COPD type ([place]) Somewhat aggravated by change in weather but does not sound to be in overt exacerbation.  I instructed her to contact me if her symptoms worsen or do not respond to the albuterol.  She voiced good understanding and will follow-up PRN  Start time: 11:03am End time: 11:10am  Total time spent on patient care (including telephone call/ virtual visit): 10 minutes  [place], [place] [phone]

## 2019-08-25 ENCOUNTER — Other Ambulatory Visit: Payer: Self-pay | Admitting: *Deleted

## 2019-08-25 DIAGNOSIS — Z20822 Contact with and (suspected) exposure to covid-19: Secondary | ICD-10-CM

## 2019-08-26 LAB — NOVEL CORONAVIRUS, NAA: SARS-CoV-2, NAA: NOT DETECTED

## 2019-09-03 ENCOUNTER — Encounter (HOSPITAL_COMMUNITY): Payer: Self-pay | Admitting: Emergency Medicine

## 2019-09-03 ENCOUNTER — Emergency Department (HOSPITAL_COMMUNITY)
Admission: EM | Admit: 2019-09-03 | Discharge: 2019-09-04 | Disposition: A | Discharge status: Home / Self Care | Payer: Medicare Other | Attending: Emergency Medicine | Admitting: Emergency Medicine

## 2019-09-03 ENCOUNTER — Other Ambulatory Visit: Payer: Self-pay

## 2019-09-03 ENCOUNTER — Emergency Department (HOSPITAL_COMMUNITY): Payer: Medicare Other

## 2019-09-03 DIAGNOSIS — I10 Essential (primary) hypertension: Secondary | ICD-10-CM | POA: Diagnosis not present

## 2019-09-03 DIAGNOSIS — Y999 Unspecified external cause status: Secondary | ICD-10-CM | POA: Diagnosis not present

## 2019-09-03 DIAGNOSIS — Z794 Long term (current) use of insulin: Secondary | ICD-10-CM | POA: Diagnosis not present

## 2019-09-03 DIAGNOSIS — S29011A Strain of muscle and tendon of front wall of thorax, initial encounter: Secondary | ICD-10-CM | POA: Diagnosis not present

## 2019-09-03 DIAGNOSIS — Z79899 Other long term (current) drug therapy: Secondary | ICD-10-CM | POA: Diagnosis not present

## 2019-09-03 DIAGNOSIS — M545 Low back pain: Secondary | ICD-10-CM | POA: Diagnosis not present

## 2019-09-03 DIAGNOSIS — J449 Chronic obstructive pulmonary disease, unspecified: Secondary | ICD-10-CM | POA: Insufficient documentation

## 2019-09-03 DIAGNOSIS — R0789 Other chest pain: Secondary | ICD-10-CM | POA: Diagnosis not present

## 2019-09-03 DIAGNOSIS — E119 Type 2 diabetes mellitus without complications: Secondary | ICD-10-CM | POA: Insufficient documentation

## 2019-09-03 DIAGNOSIS — Y92410 Unspecified street and highway as the place of occurrence of the external cause: Secondary | ICD-10-CM | POA: Diagnosis not present

## 2019-09-03 DIAGNOSIS — S299XXA Unspecified injury of thorax, initial encounter: Secondary | ICD-10-CM | POA: Diagnosis not present

## 2019-09-03 DIAGNOSIS — S3992XA Unspecified injury of lower back, initial encounter: Secondary | ICD-10-CM | POA: Diagnosis not present

## 2019-09-03 DIAGNOSIS — Z7982 Long term (current) use of aspirin: Secondary | ICD-10-CM | POA: Insufficient documentation

## 2019-09-03 DIAGNOSIS — Z87891 Personal history of nicotine dependence: Secondary | ICD-10-CM | POA: Diagnosis not present

## 2019-09-03 DIAGNOSIS — S3993XA Unspecified injury of pelvis, initial encounter: Secondary | ICD-10-CM | POA: Diagnosis not present

## 2019-09-03 DIAGNOSIS — Y93I9 Activity, other involving external motion: Secondary | ICD-10-CM | POA: Diagnosis not present

## 2019-09-03 DIAGNOSIS — R000 Tachycardia, unspecified: Secondary | ICD-10-CM | POA: Diagnosis not present

## 2019-09-03 LAB — CBG MONITORING, ED: Glucose-Capillary: 107 mg/dL — ABNORMAL HIGH (ref 70–99)

## 2019-09-03 MED ORDER — IPRATROPIUM BROMIDE HFA 17 MCG/ACT IN AERS
2.0000 | INHALATION_SPRAY | Freq: Once | RESPIRATORY_TRACT | Status: DC
  Filled 2019-09-03: qty 12.9

## 2019-09-03 MED ORDER — LORAZEPAM 1 MG PO TABS
1.0000 mg | ORAL_TABLET | Freq: Once | ORAL | Status: AC
  Administered 2019-09-03: 1 mg via ORAL
  Filled 2019-09-03: qty 1

## 2019-09-03 MED ORDER — ALBUTEROL SULFATE HFA 108 (90 BASE) MCG/ACT IN AERS
4.0000 | INHALATION_SPRAY | Freq: Once | RESPIRATORY_TRACT | Status: AC
  Administered 2019-09-03: 22:00:00 4 via RESPIRATORY_TRACT
  Filled 2019-09-03: qty 6.7

## 2019-09-03 MED ORDER — OXYCODONE-ACETAMINOPHEN 5-325 MG PO TABS
1.0000 | ORAL_TABLET | Freq: Once | ORAL | Status: AC
  Administered 2019-09-03: 1 via ORAL
  Filled 2019-09-03: qty 1
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
## 2019-09-04 NOTE — Discharge Instructions (Signed)
Recommend Tylenol, Motrin as needed for pain control.  If you develop difficulty breathing, worsening chest pain, abdominal pain, severe back pain, or other new concerning symptom, please return to ER for reassessment.  Otherwise recommend recheck with your primary doctor in the next couple days.

## 2019-09-18 ENCOUNTER — Other Ambulatory Visit: Payer: Self-pay

## 2019-09-21 ENCOUNTER — Other Ambulatory Visit: Payer: Self-pay

## 2019-09-21 ENCOUNTER — Encounter: Payer: Self-pay | Admitting: Family Medicine

## 2019-09-21 ENCOUNTER — Ambulatory Visit (INDEPENDENT_AMBULATORY_CARE_PROVIDER_SITE_OTHER): Payer: Medicare Other | Admitting: Family Medicine

## 2019-09-21 VITALS — BP 149/71 | HR 82 | Temp 98.9°F | Ht 64.0 in | Wt 221.0 lb

## 2019-09-21 DIAGNOSIS — E1165 Type 2 diabetes mellitus with hyperglycemia: Secondary | ICD-10-CM

## 2019-09-21 DIAGNOSIS — R109 Unspecified abdominal pain: Secondary | ICD-10-CM | POA: Diagnosis not present

## 2019-09-21 DIAGNOSIS — F418 Other specified anxiety disorders: Secondary | ICD-10-CM | POA: Diagnosis not present

## 2019-09-21 DIAGNOSIS — Z23 Encounter for immunization: Secondary | ICD-10-CM

## 2019-09-21 DIAGNOSIS — F419 Anxiety disorder, unspecified: Secondary | ICD-10-CM

## 2019-09-21 LAB — URINALYSIS, COMPLETE
Bilirubin, UA: NEGATIVE
Glucose, UA: NEGATIVE
Ketones, UA: NEGATIVE
Leukocytes,UA: NEGATIVE
Nitrite, UA: NEGATIVE
Protein,UA: NEGATIVE
RBC, UA: NEGATIVE
Specific Gravity, UA: >1.03 — ABNORMAL HIGH (ref 1.005–1.030)
Urobilinogen, Ur: 0.2 mg/dL (ref 0.2–1.0)
pH, UA: 5.5 (ref 5.0–7.5)

## 2019-09-21 LAB — MICROSCOPIC EXAMINATION
RBC, Urine: NONE SEEN /hpf (ref 0–2)
Renal Epithel, UA: NONE SEEN /hpf

## 2019-09-21 LAB — BAYER DCA HB A1C WAIVED: HB A1C (BAYER DCA - WAIVED): 8.1 % — ABNORMAL HIGH (ref <7.0)

## 2019-09-21 MED ORDER — TIZANIDINE HCL 4 MG PO TABS: 2.0000 mg | ORAL_TABLET | Freq: Three times a day (TID) | ORAL | 0 refills | Status: DC | PRN

## 2019-09-21 MED ORDER — FLUCONAZOLE 150 MG PO TABS: 150.0000 mg | ORAL_TABLET | Freq: Once | ORAL | 0 refills | Status: AC

## 2019-09-21 MED ORDER — OZEMPIC (0.25 OR 0.5 MG/DOSE) 2 MG/1.5ML ~~LOC~~ SOPN: PEN_INJECTOR | SUBCUTANEOUS | 2 refills | Status: AC

## 2019-09-21 MED ORDER — FLUOXETINE HCL 10 MG PO CAPS: 10.0000 mg | ORAL_CAPSULE | Freq: Every day | ORAL | 1 refills | Status: DC

## 2019-09-21 MED ORDER — LORAZEPAM 1 MG PO TABS: ORAL_TABLET | ORAL | 2 refills | Status: DC
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
## 2020-01-08 ENCOUNTER — Telehealth: Payer: Self-pay | Admitting: Family Medicine

## 2020-01-08 NOTE — Chronic Care Management (AMB) (Signed)
  Chronic Care Management   Note  01/08/2020 Name: Diana Pratt MRN: [id number] DOB: 12/24/1945  [name] is a 74 y.o. year old female who is a primary care patient of Janora Norlander, DO. I reached out to [name] by phone today in response to a referral sent by Ms. Zenia Resides Muriel's health plan.     Ms. Mcculley was given information about Chronic Care Management services today including:  1. CCM service includes personalized support from designated clinical staff supervised by her physician, including individualized plan of care and coordination with other care providers 2. 24/7 contact phone numbers for assistance for urgent and routine care needs. 3. Service will only be billed when office clinical staff spend 20 minutes or more in a month to coordinate care. 4. Only one practitioner may furnish and bill the service in a calendar month. 5. The patient may stop CCM services at any time (effective at the end of the month) by phone call to the office staff. 6. The patient will be responsible for cost sharing (co-pay) of up to 20% of the service fee (after annual deductible is met).  Patient agreed to services and verbal consent obtained.   Follow up plan: Telephone appointment with care management team member scheduled for: 03/09/2020  Noreene Larsson, [place], [place], [place] [address] Direct Dial: [phone] [email or web address]: [email or web address]

## 2020-01-20 ENCOUNTER — Ambulatory Visit (INDEPENDENT_AMBULATORY_CARE_PROVIDER_SITE_OTHER): Payer: Medicare Other | Admitting: Family Medicine

## 2020-01-20 ENCOUNTER — Encounter: Payer: Self-pay | Admitting: Family Medicine

## 2020-01-20 ENCOUNTER — Other Ambulatory Visit: Payer: Self-pay

## 2020-01-20 VITALS — BP 142/62 | HR 98 | Temp 98.4°F | Ht 64.0 in | Wt 224.0 lb

## 2020-01-20 DIAGNOSIS — E119 Type 2 diabetes mellitus without complications: Secondary | ICD-10-CM

## 2020-01-20 DIAGNOSIS — E1159 Type 2 diabetes mellitus with other circulatory complications: Secondary | ICD-10-CM

## 2020-01-20 DIAGNOSIS — F418 Other specified anxiety disorders: Secondary | ICD-10-CM

## 2020-01-20 DIAGNOSIS — R300 Dysuria: Secondary | ICD-10-CM | POA: Diagnosis not present

## 2020-01-20 DIAGNOSIS — I10 Essential (primary) hypertension: Secondary | ICD-10-CM | POA: Diagnosis not present

## 2020-01-20 DIAGNOSIS — Z79891 Long term (current) use of opiate analgesic: Secondary | ICD-10-CM | POA: Diagnosis not present

## 2020-01-20 DIAGNOSIS — E785 Hyperlipidemia, unspecified: Secondary | ICD-10-CM

## 2020-01-20 DIAGNOSIS — N952 Postmenopausal atrophic vaginitis: Secondary | ICD-10-CM

## 2020-01-20 DIAGNOSIS — I152 Hypertension secondary to endocrine disorders: Secondary | ICD-10-CM

## 2020-01-20 DIAGNOSIS — E1169 Type 2 diabetes mellitus with other specified complication: Secondary | ICD-10-CM

## 2020-01-20 LAB — URINALYSIS, COMPLETE
Bilirubin, UA: NEGATIVE
Glucose, UA: NEGATIVE
Ketones, UA: NEGATIVE
Leukocytes,UA: NEGATIVE
Nitrite, UA: NEGATIVE
Protein,UA: NEGATIVE
RBC, UA: NEGATIVE
Specific Gravity, UA: 1.025 (ref 1.005–1.030)
Urobilinogen, Ur: 0.2 mg/dL (ref 0.2–1.0)
pH, UA: 5 (ref 5.0–7.5)

## 2020-01-20 LAB — MICROSCOPIC EXAMINATION: Renal Epithel, UA: NONE SEEN /hpf

## 2020-01-20 LAB — BAYER DCA HB A1C WAIVED: HB A1C (BAYER DCA - WAIVED): 7.8 % — ABNORMAL HIGH (ref <7.0)

## 2020-01-20 MED ORDER — LORAZEPAM 1 MG PO TABS: ORAL_TABLET | ORAL | 2 refills | Status: DC

## 2020-01-20 MED ORDER — PREMARIN 0.625 MG/GM VA CREA: TOPICAL_CREAM | VAGINAL | 12 refills | Status: DC

## 2020-01-20 NOTE — Progress Notes (Signed)
Subjective: CC: DM2, HTN, GAD PCP: Janora Norlander, DO BHA:[id number] R Krauss is a 74 y.o. female presenting to clinic today for:  1. Type 2 Diabetes w/ HTN/ HLD:  Hx statin intolerance and therefore is not on cholesterol medication Patient reports she continues to have fasting blood sugars continue to be elevated. No lows. She has been using Lantus 48 units every morning and 48 units every afternoon as well as Humalog 16 units with each meal.  Ozempic was added last visit.  She is currently injecting somewhere between 0.25 and 0.5 because it was causing her to be nauseated.  Compliant with Januvia 100 mg, amlodipine, valsartan, metoprolol and hydrochlorothiazide.    No chest pain, shortness of breath, lower extremity edema.  She has had some vaginal burning with urination.  She is not sure if this is due to her sugar being high.  Her fasting blood sugar this morning was 95.  She reports polyurea.  Last eye exam: Currently scheduled. Last foot exam:  Up-to-date Last A1c:  Lab Results  Component Value Date   HGBA1C 8.1 (H) 09/21/2019   Nephropathy screen indicated?: on ARB Last flu, zoster and/or pneumovax:  Immunization History  Administered Date(s) Administered  . DTaP 01/04/2003  . Pneumococcal Conjugate-13 09/21/2019  . Pneumococcal Polysaccharide-23 06/30/2018    2.  Vaginal burning Patient reports vaginal burning externally and when she urinates.  Denies any nausea, vomiting, abdominal pain, flank pain or fevers.  No hematuria.  She is wanting to know she can start an estrogen cream for vaginal dryness.  3.  Anxiety and depression Patient reports compliance with fluoxetine 10 mg, 1 mg of Ativan nightly and occasionally an extra tablet during the day pending if she has panic attack.  Denies any excessive daytime sleepiness, falls, memory changes including mood changes.  No difficulty with breathing.  ROS: Per HPI  Allergies  Allergen Reactions  . Crestor [Rosuvastatin]       Myalgias   . Lipitor [Atorvastatin] Other (See Comments)    Myalgias   . Livalo [Pitavastatin] Other (See Comments)    myalgias  . Morphine And Related Other (See Comments)    Burning, itching  . Penicillins Hives and Itching  . Simvastatin Other (See Comments)    myalgias  . Sulfa Antibiotics Hives and Itching  . Zetia [Ezetimibe] Cough   Past Medical History:  Diagnosis Date  . Anxiety   . Asthma   . Back pain   . 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 COPD (chronic obstructive pulmonary disease) ([place])    dr. Kenn File  . Depression   . Diabetes mellitus   . Diabetic neuropathy ([place])   . GERD (gastroesophageal reflux disease)    occ heartburn  . Humerus fracture    right  . Hyperlipemia   . Hypertension   . Interstitial cystitis   . Osteoporosis   . Pneumonia    15 yrs ago  . PONV (postoperative nausea and vomiting)   . Shortness of breath dyspnea     Current Outpatient Medications:  .  ACCU-CHEK AVIVA PLUS test strip, Use to check BG up to tid.  Dx: type 2 diabetes requiring insulin therapy E11.40, Disp: 300 each, Rfl: 3 .  Accu-Chek Softclix Lancets lancets, Check blood sugar 3 times daily Dx E11.40, Disp: 300 each, Rfl: 3 .  albuterol (PROAIR HFA) 108 (90 Base) MCG/ACT inhaler, Inhale 2 puffs into the lungs every 6 (  six) hours as needed for shortness of breath., Disp: 18 g, Rfl: 1 .  amLODipine (NORVASC) 10 MG tablet, Take 1 tablet (10 mg total) by mouth daily., Disp: 90 tablet, Rfl: 3 .  aspirin 81 MG EC tablet, Take 81 mg by mouth daily.  , Disp: , Rfl:  .  Blood Glucose Monitoring Suppl (ACCU-CHEK AVIVA PLUS) w/Device KIT, Check blood glucose TID Dx E11.40, Disp: 1 kit, Rfl: 0 .  budesonide-formoterol (SYMBICORT) 160-4.5 MCG/ACT inhaler, Inhale 2 puffs into the lungs 2 (two) times daily., Disp: 3 Inhaler, Rfl: 1 .  calcium carbonate 200 MG capsule, Take 250 mg by mouth daily. , Disp: , Rfl:  .  Cholecalciferol (VITAMIN  D3) 2000 UNITS capsule, Take 2,000 Units by mouth daily. , Disp: 60 capsule, Rfl: 11 .  FLUoxetine (PROZAC) 10 MG capsule, Take 1 capsule (10 mg total) by mouth daily., Disp: 90 capsule, Rfl: 1 .  fluticasone (FLONASE) 50 MCG/ACT nasal spray, Place 2 sprays into the nose daily as needed for allergies. Congestion , Disp: , Rfl:  .  gabapentin (NEURONTIN) 300 MG capsule, Take 1 capsule (300 mg total) by mouth 3 (three) times daily., Disp: 90 capsule, Rfl: 2 .  hydrochlorothiazide (HYDRODIURIL) 25 MG tablet, Take 1 tablet (25 mg total) by mouth daily., Disp: 90 tablet, Rfl: 3 .  hydroxypropyl methylcellulose (ISOPTO TEARS) 2.5 % ophthalmic solution, Place 1 drop into both eyes daily as needed. for dry eyes , Disp: , Rfl:  .  ibuprofen (ADVIL,MOTRIN) 200 MG tablet, Take 800 mg by mouth as needed., Disp: , Rfl:  .  Insulin Glargine (LANTUS SOLOSTAR) 100 UNIT/ML Solostar Pen, Inject 50-55 units every morning and 55-60 units every evening, Disp: 105 mL, Rfl: 2 .  insulin lispro (HUMALOG KWIKPEN) 100 UNIT/ML KwikPen, Inject 0.15 mLs (15 Units total) into the skin 3 (three) times daily with meals., Disp: 15 mL, Rfl: 2 .  Insulin Pen Needle (PEN NEEDLES) 31G X 6 MM MISC, Use to administer insulin once qid. Dx E11.59 Use Leader brand, Disp: 100 each, Rfl: 1 .  LORazepam (ATIVAN) 1 MG tablet, Take 1/2-1 tablet daily if needed for anxiety.  May repeat dose once if needed during the day., Disp: 45 tablet, Rfl: 2 .  metoprolol succinate (TOPROL-XL) 100 MG 24 hr tablet, Take 1 tablet (100 mg total) by mouth daily. Take with or immediately following a meal., Disp: 90 tablet, Rfl: 3 .  Multiple Vitamins-Minerals (CENTRUM SILVER PO), Take 1 tablet by mouth daily. , Disp: , Rfl:  .  sitaGLIPtin (JANUVIA) 100 MG tablet, Take 100 mg by mouth daily., Disp: , Rfl:  .  tiZANidine (ZANAFLEX) 4 MG tablet, Take 0.5-1 tablets (2-4 mg total) by mouth every 8 (eight) hours as needed for muscle spasms., Disp: 30 tablet, Rfl: 0 .   valsartan (DIOVAN) 320 MG tablet, Take 1 tablet (320 mg total) by mouth daily., Disp: 90 tablet, Rfl: 3 Social History   Socioeconomic History  . Marital status: Married    Spouse name: [name]  . Number of children: 1  . Years of education: Not on file  . Highest education level: Not on file  Occupational History  . Occupation: retired     Comment: CNA   Tobacco Use  . Smoking status: Former Smoker    Packs/day: 0.50    Years: 34.00    Pack years: 17.00    Types: Cigarettes    Quit date: 06/01/1994    Years since quitting: 25.6  . Smokeless  tobacco: Never Used  Substance and Sexual Activity  . Alcohol use: No  . Drug use: No  . Sexual activity: Not on file    Comment: hyst  Other Topics Concern  . Not on file  Social History Narrative   RETIRED: WORKS AS A CNA AT [employer]   1 DAUGHTER-IN [place]   O GRANDKIDS   DRINKS SOCIALLY   Social Determinants of Health   Financial Resource Strain:   . Difficulty of Paying Living Expenses:   Food Insecurity:   . Worried About Charity fundraiser in the Last Year:   . Arboriculturist in the Last Year:   Transportation Needs:   . Film/video editor (Medical):   Marland Kitchen Lack of Transportation (Non-Medical):   Physical Activity:   . Days of Exercise per Week:   . Minutes of Exercise per Session:   Stress:   . Feeling of Stress :   Social Connections:   . Frequency of Communication with Friends and Family:   . Frequency of Social Gatherings with Friends and Family:   . Attends Religious Services:   . Active Member of Clubs or Organizations:   . Attends Archivist Meetings:   Marland Kitchen Marital Status:   Intimate Partner Violence:   . Fear of Current or Ex-Partner:   . Emotionally Abused:   Marland Kitchen Physically Abused:   . Sexually Abused:    Family History  Problem Relation Age of Onset  . Parkinsonism Mother   . Dementia Mother   . Colon cancer Father 56       MULTIPLE CO-MORBIDITIES  . Cancer Father   . Diabetes  Paternal Aunt   . Diabetes Paternal Uncle   . Diabetes Paternal Grandmother   . Congestive Heart Failure Paternal Grandfather   . Congestive Heart Failure Maternal Grandmother   . Stroke Maternal Grandfather   . Colon polyps Neg Hx     Objective: Office vital signs reviewed. BP (!) 142/62   Pulse 98   Temp 98.4 F (36.9 C) (Temporal)   Ht _0  (1.626 m)   Wt 224 lb (101.6 kg)   SpO2 96%   BMI 38.45 kg/m   Physical Examination:  General: Awake, alert, obese, No acute distress Cardio: regular rate and rhythm, S1S2 heard, no murmurs appreciated Pulm: clear to auscultation bilaterally, no wheezes, rhonchi or rales; normal work of breathing on room air GU: no suprapubic TTP Extremities: warm, well perfused, No edema, cyanosis or clubbing; +2 pulses bilaterally Psych: Mood stable, speech normal, affect appropriate, pleasant, interactive  Depression screen [facility] 2/9 01/20/2020 09/21/2019 07/06/2019  Decreased Interest 0 0 0  Down, Depressed, Hopeless - 0 0  PHQ - 2 Score 0 0 0  Altered sleeping 0 0 -  Tired, decreased energy 0 0 -  Change in appetite 0 0 -  Feeling bad or failure about yourself  0 0 -  Trouble concentrating 0 0 -  Moving slowly or fidgety/restless 0 0 -  Suicidal thoughts 0 0 -  PHQ-9 Score 0 0 -  Difficult doing work/chores - - -  Some recent data might be hidden   GAD 7 : Generalized Anxiety Score 01/20/2020 09/21/2019 06/25/2019 12/09/2018  Nervous, Anxious, on Edge _1 0  Control/stop worrying 0 1 2 0  Worry too much - different things 0 1 2 0  Trouble relaxing 0 0 1 0  Restless 0 0 0 0  Easily annoyed or irritable 2 1 1  1  Afraid - awful might happen 0 1 1 0  Total GAD 7 Score _0 Anxiety Difficulty Not difficult at all Somewhat difficult Somewhat difficult Not difficult at all   No results found for this or any previous visit (from the past 24 hour(s)).  Assessment/ Plan: 74 y.o. female   1. Diabetes mellitus without complication  ([place]) Sugar uncontrolled.  However her fasting blood sugar seems within normal range today.  I have instructed her to go ahead and go up to 0.5 mg of the Ozempic subcu every week as scheduled - Bayer DCA Hb A1c Waived  2. Hyperlipidemia associated with type 2 diabetes mellitus (HCC) - CMP14+EGFR - LDL Cholesterol, Direct  3. Hypertension associated with diabetes ([place]) Blood pressure is controlled upon repeat. - CMP14+EGFR  4. Depression with anxiety Controlled substance contract and UDS was updated today.  The national narcotic database was reviewed and there were no red flags. - ToxASSURE Select 13 (MW), Urine - LORazepam (ATIVAN) 1 MG tablet; Take 1/2-1 tablet daily if needed for anxiety.  May repeat dose once if needed during the day.  Dispense: 45 tablet; Refill: 2  5. Dysuria Urine with many bacteria.  Given her multiple allergies have gone ahead and sent this for culture.  Will await sensitivities for antibiotics - Urinalysis, Complete - Urine Culture  6. Vaginal atrophy Premarin prescribed.  Instructions for use discussed with patient.  Once her symptoms are under control can back down to twice weekly as needed. - conjugated estrogens (PREMARIN) vaginal cream; Apply 0.5gm vaginally 3 days per week.  Dispense: 30 g; Refill: 12   No orders of the defined types were placed in this encounter.  No orders of the defined types were placed in this encounter.    Janora Norlander, DO [place] [phone]

## 2020-01-20 NOTE — Patient Instructions (Addendum)
Sugar is coming down but still high.  Go up to 0.5mg  subcutaneous injection every week of the Ozempic.  If blood sugars go low <70, drop the lantus to 46 units twice daily.  See me back in 1 month (phone visit ok) if vaginal symptoms are not improving or schedule an appointment with Anderson Malta at [facility]

## 2020-01-21 LAB — CMP14+EGFR
ALT: 98 IU/L — ABNORMAL HIGH (ref 0–32)
AST: 109 IU/L — ABNORMAL HIGH (ref 0–40)
Albumin/Globulin Ratio: 1.4 (ref 1.2–2.2)
Albumin: 4 g/dL (ref 3.7–4.7)
Alkaline Phosphatase: 89 IU/L (ref 39–117)
BUN/Creatinine Ratio: 21 (ref 12–28)
BUN: 17 mg/dL (ref 8–27)
Bilirubin Total: 0.5 mg/dL (ref 0.0–1.2)
CO2: 20 mmol/L (ref 20–29)
Calcium: 9.1 mg/dL (ref 8.7–10.3)
Chloride: 98 mmol/L (ref 96–106)
Creatinine, Ser: 0.81 mg/dL (ref 0.57–1.00)
GFR calc Af Amer: 83 mL/min/{1.73_m2} (ref >59)
GFR calc non Af Amer: 72 mL/min/{1.73_m2} (ref >59)
Globulin, Total: 2.9 g/dL (ref 1.5–4.5)
Glucose: 180 mg/dL — ABNORMAL HIGH (ref 65–99)
Potassium: 3.8 mmol/L (ref 3.5–5.2)
Sodium: 139 mmol/L (ref 134–144)
Total Protein: 6.9 g/dL (ref 6.0–8.5)

## 2020-01-21 LAB — LDL CHOLESTEROL, DIRECT: LDL Direct: 172 mg/dL — ABNORMAL HIGH (ref 0–99)

## 2020-01-23 LAB — TOXASSURE SELECT 13 (MW), URINE

## 2020-01-23 LAB — URINE CULTURE

## 2020-01-25 ENCOUNTER — Other Ambulatory Visit: Payer: Self-pay | Admitting: Family Medicine

## 2020-01-25 DIAGNOSIS — B373 Candidiasis of vulva and vagina: Secondary | ICD-10-CM

## 2020-01-25 DIAGNOSIS — B3731 Acute candidiasis of vulva and vagina: Secondary | ICD-10-CM

## 2020-01-25 MED ORDER — FLUCONAZOLE 150 MG PO TABS: 150.0000 mg | ORAL_TABLET | Freq: Once | ORAL | 0 refills | Status: AC

## 2020-03-09 ENCOUNTER — Ambulatory Visit (INDEPENDENT_AMBULATORY_CARE_PROVIDER_SITE_OTHER): Payer: Medicare Other | Admitting: *Deleted

## 2020-03-09 DIAGNOSIS — I152 Hypertension secondary to endocrine disorders: Secondary | ICD-10-CM

## 2020-03-09 DIAGNOSIS — E119 Type 2 diabetes mellitus without complications: Secondary | ICD-10-CM

## 2020-03-09 DIAGNOSIS — I10 Essential (primary) hypertension: Secondary | ICD-10-CM

## 2020-03-09 DIAGNOSIS — E1159 Type 2 diabetes mellitus with other circulatory complications: Secondary | ICD-10-CM

## 2020-03-10 NOTE — Chronic Care Management (AMB) (Signed)
Chronic Care Management   Initial Visit Note  03/10/2020 Name: Diana Pratt MRN: [id number] DOB: Apr 04, 1946  Referred by: Janora Norlander, DO Reason for referral : Chronic Care Management (Initial Visit)   Diana Pratt is a 74 y.o. year old female who is a primary care patient of Janora Norlander, DO. The CCM team was consulted for assistance with chronic disease management and care coordination needs related to COPD, DM, HTN, osteoporosis.  Review of patient status, including review of consultants reports, relevant laboratory and other test results, and collaboration with appropriate care team members and the patient's provider was performed as part of comprehensive patient evaluation and provision of chronic care management services.    SDOH (Social Determinants of Health) assessments performed: Yes See Care Plan activities for detailed interventions related to SDOH     Medications: Outpatient Encounter Medications as of 03/09/2020  Medication Sig  . ACCU-CHEK AVIVA PLUS test strip Use to check BG up to tid.  Dx: type 2 diabetes requiring insulin therapy E11.40  . Accu-Chek Softclix Lancets lancets Check blood sugar 3 times daily Dx E11.40  . albuterol (PROAIR HFA) 108 (90 Base) MCG/ACT inhaler Inhale 2 puffs into the lungs every 6 (six) hours as needed for shortness of breath.  Marland Kitchen amLODipine (NORVASC) 10 MG tablet Take 1 tablet (10 mg total) by mouth daily.  Marland Kitchen aspirin 81 MG EC tablet Take 81 mg by mouth daily.    . Blood Glucose Monitoring Suppl (ACCU-CHEK AVIVA PLUS) w/Device KIT Check blood glucose TID Dx E11.40  . budesonide-formoterol (SYMBICORT) 160-4.5 MCG/ACT inhaler Inhale 2 puffs into the lungs 2 (two) times daily.  . calcium carbonate 200 MG capsule Take 250 mg by mouth daily.   . Cholecalciferol (VITAMIN D3) 2000 UNITS capsule Take 2,000 Units by mouth daily.   Marland Kitchen conjugated estrogens (PREMARIN) vaginal cream Apply 0.5gm vaginally 3 days per week.  Marland Kitchen FLUoxetine  (PROZAC) 10 MG capsule Take 1 capsule (10 mg total) by mouth daily.  . fluticasone (FLONASE) 50 MCG/ACT nasal spray Place 2 sprays into the nose daily as needed for allergies. Congestion   . gabapentin (NEURONTIN) 300 MG capsule Take 1 capsule (300 mg total) by mouth 3 (three) times daily.  . hydrochlorothiazide (HYDRODIURIL) 25 MG tablet Take 1 tablet (25 mg total) by mouth daily.  . hydroxypropyl methylcellulose (ISOPTO TEARS) 2.5 % ophthalmic solution Place 1 drop into both eyes daily as needed. for dry eyes   . ibuprofen (ADVIL,MOTRIN) 200 MG tablet Take 800 mg by mouth as needed.  . Insulin Glargine (LANTUS SOLOSTAR) 100 UNIT/ML Solostar Pen Inject 50-55 units every morning and 55-60 units every evening  . insulin lispro (HUMALOG KWIKPEN) 100 UNIT/ML KwikPen Inject 0.15 mLs (15 Units total) into the skin 3 (three) times daily with meals.  . Insulin Pen Needle (PEN NEEDLES) 31G X 6 MM MISC Use to administer insulin once qid. Dx E11.59 Use Leader brand  . LORazepam (ATIVAN) 1 MG tablet Take 1/2-1 tablet daily if needed for anxiety.  May repeat dose once if needed during the day.  . metoprolol succinate (TOPROL-XL) 100 MG 24 hr tablet Take 1 tablet (100 mg total) by mouth daily. Take with or immediately following a meal.  . Multiple Vitamins-Minerals (CENTRUM SILVER PO) Take 1 tablet by mouth daily.   . sitaGLIPtin (JANUVIA) 100 MG tablet Take 100 mg by mouth daily.  Marland Kitchen tiZANidine (ZANAFLEX) 4 MG tablet Take 0.5-1 tablets (2-4 mg total) by mouth every 8 (eight) hours  as needed for muscle spasms.  . valsartan (DIOVAN) 320 MG tablet Take 1 tablet (320 mg total) by mouth daily.   No facility-administered encounter medications on file as of 03/09/2020.      Goals Addressed    . "I would like for my rectal problem to get better" (pt-stated)       CARE PLAN ENTRY (see longitudinal plan of care for additional care plan information)  Current Barriers:  . Care Coordination needs related to rectal  pain in a patient with diabetes and history of internal hemorrhoids (disease states)  Nurse Case Manager Clinical Goal(s):  Marland Kitchen Over the next 7 days, patient will reach out to PCP with any new or worsening symptoms or if symptoms are not improving  Interventions:  . Inter-disciplinary care team collaboration (see longitudinal plan of care) . Chart reviewed . Discussed prior history of internal hemorrhoids . Discuss current symptoms o Rectal pain that is worse after BM o No noticeable blood in stool . Discussed that internal hemorrhoids typically don't hurt and they do normally cause rectal bleeding . Discussed anal fissure as a possible explanations . Advised that these can cause significant pain and that they don't always cause rectal bleeding . Encouraged patient to eat a high fiber diet with plenty of water. The goal is to keep the stool soft and formed. Fresh green vegetables are the best way to do accomplish this.  . Encouraged patient to reach out to PCP with any new or worsening symptoms or if symptoms persist  . Provided with CCM contact information and encouraged to reach out as needed  Patient Self Care Activities:  . Performs ADL's independently . Performs IADL's independently  Initial goal documentation     . Chronic Disease Management Needs       CARE PLAN ENTRY (see longtitudinal plan of care for additional care plan information)  Current Barriers:  . Chronic Disease Management support, education, and care coordination needs related to COPD, DM, HTN, osteoporosis  Clinical Goal(s) related to COPD, DM, HTN, osteoporosis:  Over the next 60 days, patient will:  . Work with the care management team to address educational, disease management, and care coordination needs  . Begin or continue self health monitoring activities as directed today Measure and record cbg (blood glucose) 2 times daily and Measure and record blood pressure 4 times per week . Call provider office  for new or worsened signs and symptoms Blood glucose findings outside established parameters and Blood pressure findings outside established parameters . Call care management team with questions or concerns . Verbalize basic understanding of patient centered plan of care established today  Interventions related to COPD, DM, HTN, osteoporosis:  . Evaluation of current treatment plans and patient's adherence to plan as established by provider . Assessed patient understanding of disease states . Assessed patient's education and care coordination needs . Provided disease specific education to patient  . Collaborated with appropriate clinical care team members regarding patient needs . Discussed diet: Somewhat compliant with diabetic diet . Reviewed and discussed medications o Compliant with medications . Provided with CCM contact information and encouraged to reach out as needed  Patient Self Care Activities related to COPD, DM, HTN, osteoporosis:  . Patient is unable to independently self-manage chronic health conditions  Initial goal documentation          Plan:   The care management team will reach out to the patient again over the next 60 days.   Chong Sicilian, BSN,  RN-BC Embedded Chronic Care Manager [place] / [place] Management Direct Dial: [phone]

## 2020-03-10 NOTE — Patient Instructions (Signed)
Visit Information  Goals Addressed            This Visit's Progress     Patient Stated   . "I would like for my rectal problem to get better" (pt-stated)       CARE PLAN ENTRY (see longitudinal plan of care for additional care plan information)  Current Barriers:  . Care Coordination needs related to rectal pain in a patient with diabetes and history of internal hemorrhoids (disease states)  Nurse Case Manager Clinical Goal(s):  Marland Kitchen Over the next 7 days, patient will reach out to PCP with any new or worsening symptoms or if symptoms are not improving  Interventions:  . Inter-disciplinary care team collaboration (see longitudinal plan of care) . Chart reviewed . Discussed prior history of internal hemorrhoids . Discuss current symptoms o Rectal pain that is worse after BM o No noticeable blood in stool . Discussed that internal hemorrhoids typically don't hurt and they do normally cause rectal bleeding . Discussed anal fissure as a possible explanations . Advised that these can cause significant pain and that they don't always cause rectal bleeding . Encouraged patient to eat a high fiber diet with plenty of water. The goal is to keep the stool soft and formed. Fresh green vegetables are the best way to do accomplish this.  . Encouraged patient to reach out to PCP with any new or worsening symptoms or if symptoms persist  . Provided with CCM contact information and encouraged to reach out as needed  Patient Self Care Activities:  . Performs ADL's independently . Performs IADL's independently  Initial goal documentation       Other   . Chronic Disease Management Needs       CARE PLAN ENTRY (see longtitudinal plan of care for additional care plan information)  Current Barriers:  . Chronic Disease Management support, education, and care coordination needs related to COPD, DM, HTN, osteoporosis  Clinical Goal(s) related to COPD, DM, HTN, osteoporosis:  Over the next 60  days, patient will:  . Work with the care management team to address educational, disease management, and care coordination needs  . Begin or continue self health monitoring activities as directed today Measure and record cbg (blood glucose) 2 times daily and Measure and record blood pressure 4 times per week . Call provider office for new or worsened signs and symptoms Blood glucose findings outside established parameters and Blood pressure findings outside established parameters . Call care management team with questions or concerns . Verbalize basic understanding of patient centered plan of care established today  Interventions related to COPD, DM, HTN, osteoporosis:  . Evaluation of current treatment plans and patient's adherence to plan as established by provider . Assessed patient understanding of disease states . Assessed patient's education and care coordination needs . Provided disease specific education to patient  . Collaborated with appropriate clinical care team members regarding patient needs . Discussed diet: Somewhat compliant with diabetic diet . Reviewed and discussed medications o Compliant with medications . Provided with CCM contact information and encouraged to reach out as needed  Patient Self Care Activities related to COPD, DM, HTN, osteoporosis:  . Patient is unable to independently self-manage chronic health conditions  Initial goal documentation        Ms. Frink was given information about Chronic Care Management services today including:  1. CCM service includes personalized support from designated clinical staff supervised by her physician, including individualized plan of care and coordination with other care  providers 2. 24/7 contact phone numbers for assistance for urgent and routine care needs. 3. Service will only be billed when office clinical staff spend 20 minutes or more in a month to coordinate care. 4. Only one practitioner may furnish and bill  the service in a calendar month. 5. The patient may stop CCM services at any time (effective at the end of the month) by phone call to the office staff. 6. The patient will be responsible for cost sharing (co-pay) of up to 20% of the service fee (after annual deductible is met).  Patient agreed to services and verbal consent obtained.   The patient verbalized understanding of instructions provided today and declined a print copy of patient instruction materials.   The care management team will reach out to the patient again over the next 60 days.   Chong Sicilian, BSN, RN-BC Embedded Chronic Care Manager [facility] [place] Management Direct Dial: [phone]

## 2020-04-18 ENCOUNTER — Encounter: Payer: Self-pay | Admitting: Family Medicine

## 2020-04-18 ENCOUNTER — Ambulatory Visit (INDEPENDENT_AMBULATORY_CARE_PROVIDER_SITE_OTHER): Payer: Medicare Other | Admitting: Family Medicine

## 2020-04-18 ENCOUNTER — Other Ambulatory Visit: Payer: Self-pay

## 2020-04-18 VITALS — BP 157/84 | HR 105 | Temp 97.8°F | Ht 64.0 in | Wt 214.0 lb

## 2020-04-18 DIAGNOSIS — F419 Anxiety disorder, unspecified: Secondary | ICD-10-CM

## 2020-04-18 DIAGNOSIS — E785 Hyperlipidemia, unspecified: Secondary | ICD-10-CM

## 2020-04-18 DIAGNOSIS — Z79899 Other long term (current) drug therapy: Secondary | ICD-10-CM

## 2020-04-18 DIAGNOSIS — Z794 Long term (current) use of insulin: Secondary | ICD-10-CM

## 2020-04-18 DIAGNOSIS — T466X5A Adverse effect of antihyperlipidemic and antiarteriosclerotic drugs, initial encounter: Secondary | ICD-10-CM

## 2020-04-18 DIAGNOSIS — I10 Essential (primary) hypertension: Secondary | ICD-10-CM

## 2020-04-18 DIAGNOSIS — E1159 Type 2 diabetes mellitus with other circulatory complications: Secondary | ICD-10-CM | POA: Diagnosis not present

## 2020-04-18 DIAGNOSIS — I152 Hypertension secondary to endocrine disorders: Secondary | ICD-10-CM

## 2020-04-18 DIAGNOSIS — F418 Other specified anxiety disorders: Secondary | ICD-10-CM

## 2020-04-18 DIAGNOSIS — G720 Drug-induced myopathy: Secondary | ICD-10-CM

## 2020-04-18 DIAGNOSIS — E1169 Type 2 diabetes mellitus with other specified complication: Secondary | ICD-10-CM

## 2020-04-18 LAB — BAYER DCA HB A1C WAIVED: HB A1C (BAYER DCA - WAIVED): 7.5 % — ABNORMAL HIGH (ref <7.0)

## 2020-04-18 MED ORDER — FLUOXETINE HCL 20 MG PO CAPS: 20.0000 mg | ORAL_CAPSULE | Freq: Every day | ORAL | 0 refills | Status: DC

## 2020-04-18 MED ORDER — HYDROCHLOROTHIAZIDE 25 MG PO TABS: 25.0000 mg | ORAL_TABLET | Freq: Every day | ORAL | 3 refills | Status: DC

## 2020-04-18 MED ORDER — LORAZEPAM 1 MG PO TABS: ORAL_TABLET | ORAL | 2 refills | Status: DC

## 2020-04-18 MED ORDER — LANTUS SOLOSTAR 100 UNIT/ML ~~LOC~~ SOPN: PEN_INJECTOR | SUBCUTANEOUS | 2 refills | Status: DC

## 2020-04-18 MED ORDER — ALBUTEROL SULFATE HFA 108 (90 BASE) MCG/ACT IN AERS: 2.0000 | INHALATION_SPRAY | Freq: Four times a day (QID) | RESPIRATORY_TRACT | 1 refills | Status: DC | PRN

## 2020-04-18 MED ORDER — OZEMPIC (0.25 OR 0.5 MG/DOSE) 2 MG/1.5ML ~~LOC~~ SOPN: 0.5000 mg | PEN_INJECTOR | SUBCUTANEOUS | 2 refills | Status: DC

## 2020-04-18 MED ORDER — VALSARTAN 320 MG PO TABS: 320.0000 mg | ORAL_TABLET | Freq: Every day | ORAL | 3 refills | Status: DC

## 2020-04-18 MED ORDER — METOPROLOL SUCCINATE ER 100 MG PO TB24: 100.0000 mg | ORAL_TABLET | Freq: Every day | ORAL | 3 refills | Status: DC

## 2020-04-18 NOTE — Patient Instructions (Addendum)
GO UP TO 0.5mg  of the [facility] Your sugar is not controlled.  Schedule your mammogra

## 2020-04-18 NOTE — Progress Notes (Signed)
Subjective: CC: DM2, HTN, GAD PCP: Janora Norlander, DO CNO:BSJGGE Diana Pratt is a 74 y.o. female presenting to clinic today for:  1. Type 2 Diabetes w/ HTN/ HLD:  Hx statin intolerance and therefore is not on cholesterol medication Injecting Lantus 45 units every morning and 45 units every afternoon.  She discontinues the humalog. Ozempic was increased to 0.62m weekly last visit but she "forgot" to increase ie.  Compliant with Januvia 100 mg, amlodipine, valsartan, metoprolol and hydrochlorothiazide.    No chest pain, shortness of breath, lower extremity edema.   Last eye exam: Currently waiting for an appt. Last foot exam:  needs, sees foot dr on friday Last A1c:  Lab Results  Component Value Date   HGBA1C 7.8 (H) 01/20/2020   Nephropathy screen indicated?: on ARB Last flu, zoster and/or pneumovax:  Immunization History  Administered Date(s) Administered  . DTaP 01/04/2003  . Pneumococcal Conjugate-13 09/21/2019  . Pneumococcal Polysaccharide-23 06/30/2018    2.  Anxiety and depression Patient reports compliance with fluoxetine 10 mg, 1 mg of Ativan nightly and occasionally an extra tablet during the day pending if she has panic attack.  She does note increased stress, anxiety.  She would like to go up on the fluoxetine if possible.  Denies any excessive daytime sleepiness, falls, changes in mentation or respiratory depression with the Ativan   ROS: Per HPI  Allergies  Allergen Reactions  . Crestor [Rosuvastatin]     Myalgias   . Lipitor [Atorvastatin] Other (See Comments)    Myalgias   . Livalo [Pitavastatin] Other (See Comments)    myalgias  . Morphine And Related Other (See Comments)    Burning, itching  . Penicillins Hives and Itching  . Simvastatin Other (See Comments)    myalgias  . Sulfa Antibiotics Hives and Itching  . Zetia [Ezetimibe] Cough   Past Medical History:  Diagnosis Date  . Anxiety   . Asthma   . Back pain   . 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 [place]    [place]. s[place] File . Depression   . Diabetes mellitus   . [place]abetic neuropathy [place]   . GERD (gastroesophageal reflux disease)    occ heartburn  . Humerus fracture    right  . Hyperlipemia   . Hypertension   . Interstitial cystitis   . Osteoporosis   . Pneumonia    15 yrs ago  . PONV (postoperative nausea and vomiting)   . Shortness of breath dyspnea     Current Outpatient Medications:  .  ACCU-CHEK AVIVA PLUS test strip, Use to check BG up to tid.  Dx: type 2 diabetes requiring insulin therapy E11.40, Disp: 300 each, Rfl: 3 .  Accu-Chek Softclix Lancets lancets, Check blood sugar 3 times daily Dx E11.40, Disp: 300 each, Rfl: 3 .  albuterol (PROAIR HFA) 108 (90 Base) MCG/ACT inhaler, Inhale 2 puffs into the lungs every 6 (six) hours as needed for shortness of breath., Disp: 18 g, Rfl: 1 .  amLODipine (NORVASC) 10 MG tablet, Take 1 tablet (10 mg total) by mouth daily., Disp: 90 tablet, Rfl: 3 .  aspirin 81 MG EC tablet, Take 81 mg by mouth daily.  , Disp: , Rfl:  .  Blood Glucose Monitoring Suppl (ACCU-CHEK AVIVA PLUS) w/Device KIT, Check blood glucose TID Dx E11.40, Disp: 1 kit, Rfl: 0 .  budesonide-formoterol (SYMBICORT) 160-4.5 MCG/ACT inhaler, Inhale 2  puffs into the lungs 2 (two) times daily., Disp: 3 Inhaler, Rfl: 1 .  calcium carbonate 200 MG capsule, Take 250 mg by mouth daily. , Disp: , Rfl:  .  Cholecalciferol (VITAMIN D3) 2000 UNITS capsule, Take 2,000 Units by mouth daily. , Disp: 60 capsule, Rfl: 11 .  conjugated estrogens (PREMARIN) vaginal cream, Apply 0.5gm vaginally 3 days per week., Disp: 30 g, Rfl: 12 .  FLUoxetine (PROZAC) 10 MG capsule, Take 1 capsule (10 mg total) by mouth daily., Disp: 90 capsule, Rfl: 1 .  fluticasone (FLONASE) 50 MCG/ACT nasal spray, Place 2 sprays into the nose daily as needed for allergies. Congestion , Disp: , Rfl:  .  gabapentin (NEURONTIN)  300 MG capsule, Take 1 capsule (300 mg total) by mouth 3 (three) times daily., Disp: 90 capsule, Rfl: 2 .  hydrochlorothiazide (HYDRODIURIL) 25 MG tablet, Take 1 tablet (25 mg total) by mouth daily., Disp: 90 tablet, Rfl: 3 .  hydroxypropyl methylcellulose (ISOPTO TEARS) 2.5 % ophthalmic solution, Place 1 drop into both eyes daily as needed. for dry eyes , Disp: , Rfl:  .  ibuprofen (ADVIL,MOTRIN) 200 MG tablet, Take 800 mg by mouth as needed., Disp: , Rfl:  .  Insulin Glargine (LANTUS SOLOSTAR) 100 UNIT/ML Solostar Pen, Inject 50-55 units every morning and 55-60 units every evening, Disp: 105 mL, Rfl: 2 .  insulin lispro (HUMALOG KWIKPEN) 100 UNIT/ML KwikPen, Inject 0.15 mLs (15 Units total) into the skin 3 (three) times daily with meals., Disp: 15 mL, Rfl: 2 .  Insulin Pen Needle (PEN NEEDLES) 31G X 6 MM MISC, Use to administer insulin once qid. Dx E11.59 Use Leader brand, Disp: 100 each, Rfl: 1 .  LORazepam (ATIVAN) 1 MG tablet, Take 1/2-1 tablet daily if needed for anxiety.  May repeat dose once if needed during the day., Disp: 45 tablet, Rfl: 2 .  metoprolol succinate (TOPROL-XL) 100 MG 24 hr tablet, Take 1 tablet (100 mg total) by mouth daily. Take with or immediately following a meal., Disp: 90 tablet, Rfl: 3 .  Multiple Vitamins-Minerals (CENTRUM SILVER PO), Take 1 tablet by mouth daily. , Disp: , Rfl:  .  sitaGLIPtin (JANUVIA) 100 MG tablet, Take 100 mg by mouth daily., Disp: , Rfl:  .  tiZANidine (ZANAFLEX) 4 MG tablet, Take 0.5-1 tablets (2-4 mg total) by mouth every 8 (eight) hours as needed for muscle spasms., Disp: 30 tablet, Rfl: 0 .  valsartan (DIOVAN) 320 MG tablet, Take 1 tablet (320 mg total) by mouth daily., Disp: 90 tablet, Rfl: 3 Social History   Socioeconomic History  . Marital status: Married    Spouse name: [name]  . Number of children: 1  . Years of education: Not on file  . Highest education level: Not on file  Occupational History  . Occupation: retired     Comment:  CNA   Tobacco Use  . Smoking status: Former Smoker    Packs/day: 0.50    Years: 34.00    Pack years: 17.00    Types: Cigarettes    Quit date: 06/01/1994    Years since quitting: 25.8  . Smokeless tobacco: Never Used  Vaping Use  . Vaping Use: Never used  Substance and Sexual Activity  . Alcohol use: No  . Drug use: No  . Sexual activity: Not on file    Comment: hyst  Other Topics Concern  . Not on file  Social History Narrative   RETIRED: WORKS AS A CNA AT [employer]   1  DAUGHTER-IN [place]   O GRANDKIDS   DRINKS SOCIALLY   Social Determinants of Health   Financial Resource Strain: Low Risk   . Difficulty of Paying Living Expenses: Not hard at all  Food Insecurity: No Food Insecurity  . Worried About Charity fundraiser in the Last Year: Never true  . Ran Out of Food in the Last Year: Never true  Transportation Needs: No Transportation Needs  . Lack of Transportation (Medical): No  . Lack of Transportation (Non-Medical): No  Physical Activity: Inactive  . Days of Exercise per Week: 0 days  . Minutes of Exercise per Session: 0 min  Stress: No Stress Concern Present  . Feeling of Stress : Only a little  Social Connections: Socially Integrated  . Frequency of Communication with Friends and Family: More than three times a week  . Frequency of Social Gatherings with Friends and Family: More than three times a week  . Attends Religious Services: More than 4 times per year  . Active Member of Clubs or Organizations: Yes  . Attends Archivist Meetings: More than 4 times per year  . Marital Status: Married  Human resources officer Violence: Not At Risk  . Fear of Current or Ex-Partner: No  . Emotionally Abused: No  . Physically Abused: No  . Sexually Abused: No   Family History  Problem Relation Age of Onset  . Parkinsonism Mother   . Dementia Mother   . Colon cancer Father 16       MULTIPLE CO-MORBIDITIES  . Cancer Father   . Diabetes Paternal Aunt   .  Diabetes Paternal Uncle   . Diabetes Paternal Grandmother   . Congestive Heart Failure Paternal Grandfather   . Congestive Heart Failure Maternal Grandmother   . Stroke Maternal Grandfather   . Colon polyps Neg Hx     Objective: Office vital signs reviewed. BP (!) 157/84   Pulse (!) 105   Temp 97.8 F (36.6 C) (Temporal)   Ht 5' 4" (1.626 m)   Wt 214 lb (97.1 kg)   SpO2 97%   BMI 36.73 kg/m   Physical Examination:  General: Awake, alert, obese, No acute distress Cardio: regular rate and rhythm, S1S2 heard, soft systolic murmur noted Pulm: clear to auscultation bilaterally, no wheezes, rhonchi or rales; normal work of breathing on room air GU: no suprapubic TTP Extremities: warm, well perfused, No edema, cyanosis or clubbing; +2 pulses bilaterally Psych: Mood stable, speech normal, affect appropriate, pleasant, interactive  Depression screen [facility] 2/9 04/18/2020 01/20/2020 09/21/2019  Decreased Interest 2 0 0  Down, Depressed, Hopeless 1 - 0  PHQ - 2 Score 3 0 0  Altered sleeping 1 0 0  Tired, decreased energy 1 0 0  Change in appetite 1 0 0  Feeling bad or failure about yourself  1 0 0  Trouble concentrating 1 0 0  Moving slowly or fidgety/restless 0 0 0  Suicidal thoughts 0 0 0  PHQ-9 Score 8 0 0  Difficult doing work/chores - - -  Some recent data might be hidden   GAD 7 : Generalized Anxiety Score 04/18/2020 01/20/2020 09/21/2019 06/25/2019  Nervous, Anxious, on Edge _0 Control/stop worrying 1 0 1 2  Worry too much - different things 1 0 1 2  Trouble relaxing 0 0 0 1  Restless 0 0 0 0  Easily annoyed or irritable 0 _1 Afraid - awful might happen 0 0 1 1  Total  GAD 7 Score _0 Anxiety Difficulty Not difficult at all Not difficult at all Somewhat difficult Somewhat difficult   No results found for this or any previous visit (from the past 24 hour(s)).  Assessment/ Plan: 74 y.o. female   1. Type 2 diabetes mellitus with other specified complication,  with long-term current use of insulin ([place]) Not controlled the patient again has not been compliant with her Ozempic.  I reiterated again that she should go up to the 0.5 mg weekly.  I will have Almyra Free reach out to her to remind her of this.  Continue Lantus at current dose.  Continue Januvia.  - Bayer DCA Hb A1c Waived - insulin glargine (LANTUS SOLOSTAR) 100 UNIT/ML Solostar Pen; Inject 50-55 units every morning and 55-60 units every evening  Dispense: 105 mL; Refill: 2 - Semaglutide,0.25 or 0.5MG/DOS, (OZEMPIC, 0.25 OR 0.5 MG/DOSE,) 2 MG/1.5ML SOPN; Inject 0.375 mLs (0.5 mg total) into the skin once a week.  Dispense: 1 pen; Refill: 2  2. Hypertension associated with diabetes ([place]) Not controlled, reportedly compliant with medications.  I am going to reach out to her pharmacist to see if perhaps she has some additional recommendations.  Patient may need to be seen in the refractory hypertension clinic. - metoprolol succinate (TOPROL-XL) 100 MG 24 hr tablet; Take 1 tablet (100 mg total) by mouth daily. Take with or immediately following a meal.  Dispense: 90 tablet; Refill: 3 - valsartan (DIOVAN) 320 MG tablet; Take 1 tablet (320 mg total) by mouth daily.  Dispense: 90 tablet; Refill: 3  3. Hyperlipidemia associated with type 2 diabetes mellitus ([place]) Unable to tolerate statins  4. Statin myopathy  5. Depression with anxiety Not controlled.  Prozac increased - FLUoxetine (PROZAC) 20 MG capsule; Take 1 capsule (20 mg total) by mouth daily.  Dispense: 90 capsule; Refill: 0  6. Anxiety National narcotic database was reviewed and there were no red flags. - FLUoxetine (PROZAC) 20 MG capsule; Take 1 capsule (20 mg total) by mouth daily.  Dispense: 90 capsule; Refill: 0 - LORazepam (ATIVAN) 1 MG tablet; Take 1/2-1 tablet daily if needed for anxiety.  May repeat dose once if needed during the day.  Dispense: 45 tablet; Refill: 2  7. Chronic prescription benzodiazepine use   No orders of the  defined types were placed in this encounter.  No orders of the defined types were placed in this encounter.    Janora Norlander, DO [place] [phone]

## 2020-04-27 ENCOUNTER — Ambulatory Visit (INDEPENDENT_AMBULATORY_CARE_PROVIDER_SITE_OTHER): Payer: Medicare Other | Admitting: Pharmacist

## 2020-04-27 DIAGNOSIS — E1169 Type 2 diabetes mellitus with other specified complication: Secondary | ICD-10-CM | POA: Diagnosis not present

## 2020-04-27 DIAGNOSIS — G720 Drug-induced myopathy: Secondary | ICD-10-CM | POA: Diagnosis not present

## 2020-04-27 DIAGNOSIS — Z794 Long term (current) use of insulin: Secondary | ICD-10-CM

## 2020-04-27 NOTE — Progress Notes (Signed)
° ° °  04/27/2020 Name: Diana Pratt MRN: [id number] DOB: 05/26/1946  S:  67 yoF presents for diabetes evaluation, education, and management. Patient was referred and last seen by Primary Care Provider on 04/18/20. Insurance coverage/medication affordability: UHC medicare  Patient reports adherence with medications.  Current diabetes medications include: lantus  Current hypertension medications include: amlodipine, hctz, metoprolol, valsartan Goal 130/80  Current hyperlipidemia medications include: n/a STATIN ALLERGY/INTOLERANCE DOCUMENTED IN EMR ICD10 code: G72 for drug induced myopathy.  Patient has tried and failed   Patient denies hypoglycemic events.   Patient reported dietary habits: Eats 2-3 meals/day Discussed meal planning options and Plate method for healthy eating  Avoid sugary drinks and desserts  Incorporate balanced protein, non starchy veggies, 1 serving of carbohydrate with each meal  Increase water intake  Increase physical activity as able  Patient-reported exercise habits: n/a  O:  Lab Results  Component Value Date   HGBA1C 7.5 (H) 04/18/2020   Lipid Panel     Component Value Date/Time   CHOL 242 (H) 06/25/2019 1115   CHOL 243 (H) 05/29/2013 1037   TRIG 258 (H) 06/25/2019 1115   TRIG 171 (H) 12/25/2013 0855   TRIG 304 (H) 05/29/2013 1037   HDL 31 (L) 06/25/2019 1115   HDL 38 (L) 12/25/2013 0855   HDL 30 (L) 05/29/2013 1037   CHOLHDL 7.8 (H) 06/25/2019 1115   LDLCALC 159 (H) 06/25/2019 1115   LDLCALC 192 (H) 12/25/2013 0855   LDLCALC 152 (H) 05/29/2013 1037   LDLDIRECT 172 (H) 01/20/2020 1359    Home fasting blood sugars: 124, 128  2 hour post-meal/random blood sugars: n/a   A/P:  Diabetes T2DM A1c 7.5%. Patient is able to verbalize appropriate hypoglycemia management plan. Patient is adherent with medication.  -Continue basal insulin -- Lantus Inject 50-55 units every morning and 55-60 units every evening  LANTUS SAMPLE [id number], EXP  08-04-20   -Started GLP-1 Ozempic (generic name: semglutide)   Instructions: Inject 0.25 mg into the skin weekly for 4 weeks, then increase to 0.5mg  weekly thereafter (as tolerated)  SAMPLE GIVEN [id number], EXP 10/23  -STOP Januvia  -Extensively discussed pathophysiology of diabetes, recommended lifestyle interventions, dietary effects on blood sugar control  -Counseled on s/sx of and management of hypoglycemia  -Next A1C anticipated 07/2020    Written patient instructions provided.  Total time in face to face counseling 30 minutes.   Follow up PCP Clinic Visit ON 07/2020.   Regina Eck, PharmD, BCPS Clinical Pharmacist, [place]  II Phone [phone]

## 2020-05-16 DIAGNOSIS — G720 Drug-induced myopathy: Secondary | ICD-10-CM | POA: Insufficient documentation

## 2020-05-18 ENCOUNTER — Other Ambulatory Visit: Payer: Self-pay | Admitting: Family Medicine

## 2020-05-18 DIAGNOSIS — Z1231 Encounter for screening mammogram for malignant neoplasm of breast: Secondary | ICD-10-CM

## 2020-05-30 ENCOUNTER — Ambulatory Visit (INDEPENDENT_AMBULATORY_CARE_PROVIDER_SITE_OTHER): Payer: Medicare Other | Admitting: Pharmacist

## 2020-05-30 DIAGNOSIS — G720 Drug-induced myopathy: Secondary | ICD-10-CM

## 2020-05-30 DIAGNOSIS — Z794 Long term (current) use of insulin: Secondary | ICD-10-CM | POA: Diagnosis not present

## 2020-05-30 DIAGNOSIS — E1169 Type 2 diabetes mellitus with other specified complication: Secondary | ICD-10-CM

## 2020-05-30 NOTE — Progress Notes (Signed)
° ° °  05/30/2020 Name: Diana Pratt MRN: [id number] DOB: 11/18/1945   S:  25 yoF presents for diabetes evaluation, education, and management Patient was referred and last seen by Primary Care Provider on 04/18/20. Insurance coverage/medication affordability: UHC medicare  I connected with  [name] on 05/30/20 by a video enabled telemedicine application and verified that I am speaking with the correct person using two identifiers.   I discussed the limitations of evaluation and management by telemedicine. The patient expressed understanding and agreed to proceed.  Patient reports adherence with medications.  Current diabetes medications include: ozempic, lantus  Current hypertension medications include: valsartan, metoprolol,amlodipine, hctz Goal 130/80 Current hyperlipidemia medications include: n/a STATIN ALLERGY/INTOLERANCE DOCUMENTED IN EMR Yes--ICD10 DX; G72 for statin intolerance   Patient denies hypoglycemic events.   Patient reported dietary habits: Eats 2-3 meals/day Discussed meal planning options and Plate method for healthy eating Avoid sugary drinks and desserts Incorporate balanced protein, non starchy veggies, 1 serving of carbohydrate with each meal Increase water intake Increase physical activity as able  Patient-reported exercise habits: n/a  O:  Lab Results  Component Value Date   HGBA1C 7.5 (H) 04/18/2020   Lipid Panel     Component Value Date/Time   CHOL 242 (H) 06/25/2019 1115   CHOL 243 (H) 05/29/2013 1037   TRIG 258 (H) 06/25/2019 1115   TRIG 171 (H) 12/25/2013 0855   TRIG 304 (H) 05/29/2013 1037   HDL 31 (L) 06/25/2019 1115   HDL 38 (L) 12/25/2013 0855   HDL 30 (L) 05/29/2013 1037   CHOLHDL 7.8 (H) 06/25/2019 1115   LDLCALC 159 (H) 06/25/2019 1115   LDLCALC 192 (H) 12/25/2013 0855   LDLCALC 152 (H) 05/29/2013 1037   LDLDIRECT 172 (H) 01/20/2020 1359    Home fasting blood sugars: 110-120s  2 hour post-meal/random blood sugars: up  to 200 (usually after a high carb meal.    A/P:  Diabetes T2DM A1c 7.5%. Patient is able to verbalize appropriate hypoglycemia management plan. Patient is adherent with medication.  -Continue basal insulin -- Lantus Inject 45 units every morning and 45 units every evening  LANTUS SAMPLE [id number], EXP 08-04-20   -Continue Ozempic 0.5mg  sq weekly -Started GLP-1 Ozempic (generic name: semglutide)   Sample given EXN#[id number], EXP10/23  -Extensively discussed pathophysiology of diabetes, recommended lifestyle interventions, dietary effects on blood sugar control  -Counseled on s/sx of and management of hypoglycemia  -Next A1C anticipated 07/26/20   Written patient instructions provided.   Follow up PCP Clinic Visit ON 07/06/20, 07/26/20.    Regina Eck, PharmD, BCPS Clinical Pharmacist, [place]  II Phone [phone]

## 2020-06-01 MED ORDER — PEN NEEDLES 31G X 6 MM MISC: 3 refills | Status: DC

## 2020-06-01 NOTE — Addendum Note (Signed)
Addended by: Lottie Dawson D on: 06/01/2020 11:22 AM   Modules accepted: Orders

## 2020-07-06 ENCOUNTER — Ambulatory Visit (INDEPENDENT_AMBULATORY_CARE_PROVIDER_SITE_OTHER): Payer: Medicare Other | Admitting: *Deleted

## 2020-07-06 DIAGNOSIS — Z0000 Encounter for general adult medical examination without abnormal findings: Secondary | ICD-10-CM

## 2020-07-06 NOTE — Progress Notes (Signed)
MEDICARE ANNUAL WELLNESS VISIT  07/06/2020  Telephone Visit Disclaimer This Medicare AWV was conducted by telephone due to national recommendations for restrictions regarding the COVID-19 Pandemic (e.g. social distancing).  I verified, using two identifiers, that I am speaking with Diana Pratt or their authorized healthcare agent. I discussed the limitations, risks, security, and privacy concerns of performing an evaluation and management service by telephone and the potential availability of an in-person appointment in the future. The patient expressed understanding and agreed to proceed.   Subjective:  Diana Pratt is a 74 y.o. female patient of Janora Norlander, DO who had a Medicare Annual Wellness Visit today via telephone. Diana Pratt is Retired and lives with their spouse. she has 1 child. she reports that she is socially active and does interact with friends/family regularly. she is minimally physically active and enjoys reading.  Patient Care Team: Janora Norlander, DO as PCP - General (Family Medicine) Danie Binder, MD (Inactive) as Consulting Physician (Gastroenterology) Druscilla Brownie, MD as Consulting Physician (Dermatology) Justice Britain, MD as Consulting Physician (Orthopedic Surgery) Estill Dooms, NP as Nurse Practitioner (Obstetrics and Gynecology) Ilean China, RN as Registered Nurse Lavera Guise, [facility] (Pharmacist)  Advanced Directives 07/06/2020 09/03/2019 07/06/2019 06/05/2017 04/10/2017 02/11/2017 06/27/2016  Does Patient Have a Medical Advance Directive? No No No No No No No  Would patient like information on creating a medical advance directive? No - Patient declined - No - Patient declined Yes (MAU/Ambulatory/Procedural Areas - Information given) Yes (MAU/Ambulatory/Procedural Areas - Information given) No - Patient declined -    Hospital Utilization Over the Past 12 Months: # of hospitalizations or ER visits: 1 # of surgeries: 0  Review of  Systems    Patient reports that her overall health is unchanged compared to last year.  History obtained from chart review  Patient Reported Readings (BP, Pulse, CBG, Weight, etc) none  Pain Assessment Pain : No/denies pain     Current Medications & Allergies (verified) Allergies as of 07/06/2020      Reactions   Crestor [rosuvastatin]    Myalgias   Lipitor [atorvastatin] Other (See Comments)   Myalgias   Livalo [pitavastatin] Other (See Comments)   myalgias   Morphine And Related Other (See Comments)   Burning, itching   Penicillins Hives, Itching   Simvastatin Other (See Comments)   myalgias   Sulfa Antibiotics Hives, Itching   Zetia [ezetimibe] Cough      Medication List       Accurate as of July 06, 2020  2:13 PM. If you have any questions, ask your nurse or doctor.        STOP taking these medications   FLUoxetine 20 MG capsule Commonly known as: PROzac     TAKE these medications   Accu-Chek Aviva Plus test strip Generic drug: glucose blood Use to check BG up to tid.  Dx: type 2 diabetes requiring insulin therapy E11.40   Accu-Chek Aviva Plus w/Device Kit Check blood glucose TID Dx E11.40   Accu-Chek Softclix Lancets lancets Check blood sugar 3 times daily Dx E11.40   albuterol 108 (90 Base) MCG/ACT inhaler Commonly known as: ProAir HFA Inhale 2 puffs into the lungs every 6 (six) hours as needed for shortness of breath.   amLODipine 10 MG tablet Commonly known as: NORVASC Take 1 tablet (10 mg total) by mouth daily.   aspirin 81 MG EC tablet Take 81 mg by mouth daily.   budesonide-formoterol 160-4.5 MCG/ACT  inhaler Commonly known as: SYMBICORT Inhale 2 puffs into the lungs 2 (two) times daily.   calcium carbonate 200 MG capsule Take 250 mg by mouth daily.   CENTRUM SILVER PO Take 1 tablet by mouth daily.   fluticasone 50 MCG/ACT nasal spray Commonly known as: FLONASE Place 2 sprays into the nose daily as needed for allergies.  Congestion   hydrochlorothiazide 25 MG tablet Commonly known as: HYDRODIURIL Take 1 tablet (25 mg total) by mouth daily.   hydroxypropyl methylcellulose / hypromellose 2.5 % ophthalmic solution Commonly known as: ISOPTO TEARS / GONIOVISC Place 1 drop into both eyes daily as needed. for dry eyes   ibuprofen 200 MG tablet Commonly known as: ADVIL Take 800 mg by mouth as needed.   Lantus SoloStar 100 UNIT/ML Solostar Pen Generic drug: insulin glargine Inject 50-55 units every morning and 55-60 units every evening What changed:   how much to take  when to take this   LORazepam 1 MG tablet Commonly known as: ATIVAN Take 1/2-1 tablet daily if needed for anxiety.  May repeat dose once if needed during the day.   metoprolol succinate 100 MG 24 hr tablet Commonly known as: TOPROL-XL Take 1 tablet (100 mg total) by mouth daily. Take with or immediately following a meal.   Ozempic (0.25 or 0.5 MG/DOSE) 2 MG/1.5ML Sopn Generic drug: Semaglutide(0.25 or 0.5MG/DOS) Inject 0.375 mLs (0.5 mg total) into the skin once a week.   Pen Needles 31G X 6 MM Misc Use to administer insulin once qid. Dx E11.59 Use Leader brand   Premarin vaginal cream Generic drug: conjugated estrogens Apply 0.5gm vaginally 3 days per week.   tiZANidine 4 MG tablet Commonly known as: Zanaflex Take 0.5-1 tablets (2-4 mg total) by mouth every 8 (eight) hours as needed for muscle spasms.   valsartan 320 MG tablet Commonly known as: DIOVAN Take 1 tablet (320 mg total) by mouth daily.   Vitamin D3 50 MCG (2000 UT) capsule Take 2,000 Units by mouth daily.       History (reviewed): Past Medical History:  Diagnosis Date  . Anxiety   . Asthma   . Back pain   . 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 COPD (chronic obstructive pulmonary disease) ([place])    dr. Kenn File  . Depression   . Diabetes mellitus   . Diabetic neuropathy ([place])   . GERD (gastroesophageal reflux  disease)    occ heartburn  . Humerus fracture    right  . Hyperlipemia   . Hypertension   . Interstitial cystitis   . Osteoporosis   . Pneumonia    15 yrs ago  . PONV (postoperative nausea and vomiting)   . Shortness of breath dyspnea    Past Surgical History:  Procedure Laterality Date  . ABDOMINAL HYSTERECTOMY  03/1984   partial  . COLONOSCOPY N/A 07/19/2014   Procedure: COLONOSCOPY;  Surgeon: Danie Binder, MD;  [facility];  Service: Endoscopy;  Laterality: N/A;  9:30  . REVERSE SHOULDER ARTHROPLASTY Right 06/14/2016   Procedure: REVERSE SHOULDER ARTHROPLASTY;  Surgeon: Justice Britain, MD;  Location: [facility];  Service: Orthopedics;  Laterality: Right;  . Skin cancer removed  08/07/2016   left ear   Family History  Problem Relation Age of Onset  . Parkinsonism Mother   . Dementia Mother   . Colon cancer Father 24       MULTIPLE CO-MORBIDITIES  . Cancer Father   .  Diabetes Paternal Aunt   . Diabetes Paternal Uncle   . Diabetes Paternal Grandmother   . Congestive Heart Failure Paternal Grandfather   . Congestive Heart Failure Maternal Grandmother   . Stroke Maternal Grandfather   . Colon polyps Neg Hx    Social History   Socioeconomic History  . Marital status: Married    Spouse name: Diana Pratt  . Number of children: 1  . Years of education: 78  . Highest education level: Some college, no degree  Occupational History  . Occupation: retired     Comment: CNA   Tobacco Use  . Smoking status: Former Smoker    Packs/day: 0.50    Years: 34.00    Pack years: 17.00    Types: Cigarettes    Quit date: 06/01/1994    Years since quitting: 26.1  . Smokeless tobacco: Never Used  Vaping Use  . Vaping Use: Never used  Substance and Sexual Activity  . Alcohol use: No  . Drug use: No  . Sexual activity: Yes    Birth control/protection: Surgical    Comment: hyst  Other Topics Concern  . Not on file  Social History Narrative   RETIRED: WORKS AS A CNA AT [employer]   1 DAUGHTER-IN [place]   O GRANDKIDS   DRINKS SOCIALLY   Social Determinants of Health   Financial Resource Strain: Low Risk   . Difficulty of Paying Living Expenses: Not very hard  Food Insecurity: No Food Insecurity  . Worried About Charity fundraiser in the Last Year: Never true  . Ran Out of Food in the Last Year: Never true  Transportation Needs: No Transportation Needs  . Lack of Transportation (Medical): No  . Lack of Transportation (Non-Medical): No  Physical Activity: Insufficiently Active  . Days of Exercise per Week: 3 days  . Minutes of Exercise per Session: 30 min  Stress: No Stress Concern Present  . Feeling of Stress : Not at all  Social Connections: Socially Integrated  . Frequency of Communication with Friends and Family: More than three times a week  . Frequency of Social Gatherings with Friends and Family: More than three times a week  . Attends Religious Services: More than 4 times per year  . Active Member of Clubs or Organizations: Yes  . Attends Archivist Meetings: More than 4 times per year  . Marital Status: Married    Activities of Daily Living In your present state of health, do you have any difficulty performing the following activities: 07/06/2020  Hearing? N  Vision? N  Comment wears RX glasses-encouraged to schedule diabetic eye exam  Difficulty concentrating or making decisions? N  Walking or climbing stairs? Y  Comment gets SOB due to COPD  Dressing or bathing? N  Doing errands, shopping? N  Preparing Food and eating ? N  Using the Toilet? N  In the past six months, have you accidently leaked urine? Y  Comment wears depends  Do you have problems with loss of bowel control? N  Managing your Medications? N  Managing your Finances? N  Housekeeping or managing your Housekeeping? N  Some recent data might be hidden    Patient Education/ Literacy How often do you need to have someone help you when you read  instructions, pamphlets, or other written materials from your doctor or pharmacy?: 1 - Never What is the last grade level you completed in school?: 1 year of Business School after Highschool  Exercise Current Exercise  Habits: Home exercise routine, Type of exercise: walking, Time (Minutes): 30, Frequency (Times/Week): 3, Weekly Exercise (Minutes/Week): 90, Intensity: Mild, Exercise limited by: respiratory conditions(s)  Diet Patient reports consuming 2 meals a day and 1 snack(s) a day Patient reports that her primary diet is: Regular Patient reports that she does have regular access to food.   Depression Screen PHQ 2/9 Scores 07/06/2020 04/18/2020 01/20/2020 09/21/2019 07/06/2019 06/25/2019 12/09/2018  PHQ - 2 Score 0 3 0 0 0 2 0  PHQ- 9 Score - 8 0 0 - 5 1     Fall Risk Fall Risk  07/06/2020 04/18/2020 01/20/2020 09/21/2019 07/06/2019  Falls in the past year? 0 0 0 0 0  Number falls in past yr: - - - - -  Injury with Fall? - - - - -  Risk for fall due to : - - - - -  Follow up - - - - -     Objective:  Diana Pratt seemed alert and oriented and she participated appropriately during our telephone visit.  Blood Pressure Weight BMI  BP Readings from Last 3 Encounters:  04/18/20 (!) 157/84  01/21/20 (!) 142/62  09/21/19 (!) 149/71   Wt Readings from Last 3 Encounters:  04/18/20 214 lb (97.1 kg)  01/20/20 224 lb (101.6 kg)  09/21/19 221 lb (100.2 kg)   BMI Readings from Last 1 Encounters:  04/18/20 36.73 kg/m    *Unable to obtain current vital signs, weight, and BMI due to telephone visit type  Hearing/Vision  . Diana Pratt did not seem to have difficulty with hearing/understanding during the telephone conversation . Reports that she has not had a formal eye exam by an eye care professional within the past year . Reports that she has not had a formal hearing evaluation within the past year *Unable to fully assess hearing and vision during telephone visit type  Cognitive Function: 6CIT  Screen 07/06/2020 07/06/2019  What Year? 0 points 0 points  What month? 0 points 0 points  What time? 0 points 0 points  Count back from 20 2 points 0 points  Months in reverse 0 points 0 points  Repeat phrase 0 points 2 points  Total Score 2 2   (Normal:0-7, Significant for Dysfunction: >8)  Normal Cognitive Function Screening: Yes   Immunization & Health Maintenance Record Immunization History  Administered Date(s) Administered  . DTaP 01/04/2003  . Pneumococcal Conjugate-13 09/21/2019  . Pneumococcal Polysaccharide-23 06/30/2018    Health Maintenance  Topic Date Due  . COVID-19 Vaccine (1) Never done  . TETANUS/TDAP  01/03/2013  . MAMMOGRAM  06/11/2014  . OPHTHALMOLOGY EXAM  12/23/2018  . FOOT EXAM  12/10/2019  . INFLUENZA VACCINE  Never done  . HEMOGLOBIN A1C  10/18/2020  . COLONOSCOPY  07/19/2024  . DEXA SCAN  Completed  . Hepatitis C Screening  Completed  . PNA vac Low Risk Adult  Completed       Assessment  This is a routine wellness examination for Diana Pratt.  Health Maintenance: Due or Overdue Health Maintenance Due  Topic Date Due  . COVID-19 Vaccine (1) Never done  . TETANUS/TDAP  01/03/2013  . MAMMOGRAM  06/11/2014  . OPHTHALMOLOGY EXAM  12/23/2018  . FOOT EXAM  12/10/2019  . INFLUENZA VACCINE  Never done    Diana Pratt does not need a referral for Community Assistance: Care Management:   no Social Work:    no Prescription Assistance:  no Nutrition/Diabetes Education:  no  Plan:  Personalized Goals Goals Addressed            This Visit's Progress   . DIET - INCREASE WATER INTAKE       Try to drink 6-8 glasses of water daily      Personalized Health Maintenance & Screening Recommendations  Influenza vaccine Td vaccine Shingrix vaccine COVID vaccine Diabetic Eye Exam  Lung Cancer Screening Recommended: no (Low Dose CT Chest recommended if Age 35-80 years, 30 pack-year currently smoking OR have quit w/in past 15  years) Hepatitis C Screening recommended: no HIV Screening recommended: no  Advanced Directives: Written information was not prepared per patient's request.  Referrals & Orders No orders of the defined types were placed in this encounter.   Follow-up Plan . Follow-up with Janora Norlander, DO as planned . Schedule your Diabetic Eye Exam as discussed . Consider Flu, Shingrix, TDAP and COVID vaccines   I have personally reviewed and noted the following in the patient's chart:   . Medical and social history . Use of alcohol, tobacco or illicit drugs  . Current medications and supplements . Functional ability and status . Nutritional status . Physical activity . Advanced directives . List of other physicians . Hospitalizations, surgeries, and ER visits in previous 12 months . Vitals . Screenings to include cognitive, depression, and falls . Referrals and appointments  In addition, I have reviewed and discussed with Diana Pratt certain preventive protocols, quality metrics, and best practice recommendations. A written personalized care plan for preventive services as well as general preventive health recommendations is available and can be mailed to the patient at her request.      Milas Hock, LPN  02/06/3245

## 2020-07-06 NOTE — Patient Instructions (Signed)
Preventive Care 38 Years and Older, Female Preventive care refers to lifestyle choices and visits with your health care provider that can promote health and wellness. This includes:  A yearly physical exam. This is also called an annual well check.  Regular dental and eye exams.  Immunizations.  Screening for certain conditions.  Healthy lifestyle choices, such as diet and exercise. What can I expect for my preventive care visit? Physical exam Your health care provider will check:  Height and weight. These may be used to calculate body mass index (BMI), which is a measurement that tells if you are at a healthy weight.  Heart rate and blood pressure.  Your skin for abnormal spots. Counseling Your health care provider may ask you questions about:  Alcohol, tobacco, and drug use.  Emotional well-being.  Home and relationship well-being.  Sexual activity.  Eating habits.  History of falls.  Memory and ability to understand (cognition).  Work and work Statistician.  Pregnancy and menstrual history. What immunizations do I need?  Influenza (flu) vaccine  This is recommended every year. Tetanus, diphtheria, and pertussis (Tdap) vaccine  You may need a Td booster every 10 years. Varicella (chickenpox) vaccine  You may need this vaccine if you have not already been vaccinated. Zoster (shingles) vaccine  You may need this after age 33. Pneumococcal conjugate (PCV13) vaccine  One dose is recommended after age 33. Pneumococcal polysaccharide (PPSV23) vaccine  One dose is recommended after age 72. Measles, mumps, and rubella (MMR) vaccine  You may need at least one dose of MMR if you were born in 1957 or later. You may also need a second dose. Meningococcal conjugate (MenACWY) vaccine  You may need this if you have certain conditions. Hepatitis A vaccine  You may need this if you have certain conditions or if you travel or work in places where you may be exposed  to hepatitis A. Hepatitis B vaccine  You may need this if you have certain conditions or if you travel or work in places where you may be exposed to hepatitis B. Haemophilus influenzae type b (Hib) vaccine  You may need this if you have certain conditions. You may receive vaccines as individual doses or as more than one vaccine together in one shot (combination vaccines). Talk with your health care provider about the risks and benefits of combination vaccines. What tests do I need? Blood tests  Lipid and cholesterol levels. These may be checked every 5 years, or more frequently depending on your overall health.  Hepatitis C test.  Hepatitis B test. Screening  Lung cancer screening. You may have this screening every year starting at age 39 if you have a 30-pack-year history of smoking and currently smoke or have quit within the past 15 years.  Colorectal cancer screening. All adults should have this screening starting at age 36 and continuing until age 15. Your health care provider may recommend screening at age 23 if you are at increased risk. You will have tests every 1-10 years, depending on your results and the type of screening test.  Diabetes screening. This is done by checking your blood sugar (glucose) after you have not eaten for a while (fasting). You may have this done every 1-3 years.  Mammogram. This may be done every 1-2 years. Talk with your health care provider about how often you should have regular mammograms.  BRCA-related cancer screening. This may be done if you have a family history of breast, ovarian, tubal, or peritoneal cancers.  Other tests  Sexually transmitted disease (STD) testing.  Bone density scan. This is done to screen for osteoporosis. You may have this done starting at age 44. Follow these instructions at home: Eating and drinking  Eat a diet that includes fresh fruits and vegetables, whole grains, lean protein, and low-fat dairy products. Limit  your intake of foods with high amounts of sugar, saturated fats, and salt.  Take vitamin and mineral supplements as recommended by your health care provider.  Do not drink alcohol if your health care provider tells you not to drink.  If you drink alcohol: ? Limit how much you have to 0-1 drink a day. ? Be aware of how much alcohol is in your drink. In the [place] one drink equals one 12 oz bottle of beer (355 mL), one 5 oz glass of wine (148 mL), or one 1 oz glass of hard liquor (44 mL). Lifestyle  Take daily care of your teeth and gums.  Stay active. Exercise for at least 30 minutes on 5 or more days each week.  Do not use any products that contain nicotine or tobacco, such as cigarettes, e-cigarettes, and chewing tobacco. If you need help quitting, ask your health care provider.  If you are sexually active, practice safe sex. Use a condom or other form of protection in order to prevent STIs (sexually transmitted infections).  Talk with your health care provider about taking a low-dose aspirin or statin. What's next?  Go to your health care provider once a year for a well check visit.  Ask your health care provider how often you should have your eyes and teeth checked.  Stay up to date on all vaccines. This information is not intended to replace advice given to you by your health care provider. Make sure you discuss any questions you have with your health care provider. Document Revised: 10/16/2018 Document Reviewed: 10/16/2018 Elsevier Patient Education  2020 [employer].

## 2020-07-25 ENCOUNTER — Ambulatory Visit: Payer: Medicare Other | Admitting: Family Medicine

## 2020-07-26 ENCOUNTER — Other Ambulatory Visit: Payer: Self-pay

## 2020-07-26 ENCOUNTER — Ambulatory Visit (INDEPENDENT_AMBULATORY_CARE_PROVIDER_SITE_OTHER): Payer: Medicare Other | Admitting: Family Medicine

## 2020-07-26 ENCOUNTER — Encounter: Payer: Self-pay | Admitting: Family Medicine

## 2020-07-26 VITALS — BP 138/70 | HR 80 | Temp 97.9°F | Ht 64.0 in | Wt 213.0 lb

## 2020-07-26 DIAGNOSIS — E785 Hyperlipidemia, unspecified: Secondary | ICD-10-CM

## 2020-07-26 DIAGNOSIS — Z794 Long term (current) use of insulin: Secondary | ICD-10-CM

## 2020-07-26 DIAGNOSIS — I10 Essential (primary) hypertension: Secondary | ICD-10-CM

## 2020-07-26 DIAGNOSIS — Z79899 Other long term (current) drug therapy: Secondary | ICD-10-CM | POA: Diagnosis not present

## 2020-07-26 DIAGNOSIS — T466X5A Adverse effect of antihyperlipidemic and antiarteriosclerotic drugs, initial encounter: Secondary | ICD-10-CM

## 2020-07-26 DIAGNOSIS — G720 Drug-induced myopathy: Secondary | ICD-10-CM

## 2020-07-26 DIAGNOSIS — E1169 Type 2 diabetes mellitus with other specified complication: Secondary | ICD-10-CM | POA: Diagnosis not present

## 2020-07-26 DIAGNOSIS — F418 Other specified anxiety disorders: Secondary | ICD-10-CM | POA: Diagnosis not present

## 2020-07-26 DIAGNOSIS — R109 Unspecified abdominal pain: Secondary | ICD-10-CM

## 2020-07-26 DIAGNOSIS — E1159 Type 2 diabetes mellitus with other circulatory complications: Secondary | ICD-10-CM

## 2020-07-26 LAB — BAYER DCA HB A1C WAIVED: HB A1C (BAYER DCA - WAIVED): 7.7 % — ABNORMAL HIGH (ref <7.0)

## 2020-07-26 MED ORDER — AMLODIPINE BESYLATE 10 MG PO TABS: 10.0000 mg | ORAL_TABLET | Freq: Every day | ORAL | 3 refills | Status: DC

## 2020-07-26 MED ORDER — LANTUS SOLOSTAR 100 UNIT/ML ~~LOC~~ SOPN: 45.0000 [IU] | PEN_INJECTOR | Freq: Two times a day (BID) | SUBCUTANEOUS | 3 refills | Status: DC

## 2020-07-26 MED ORDER — OZEMPIC (1 MG/DOSE) 2 MG/1.5ML ~~LOC~~ SOPN: 1.0000 mg | PEN_INJECTOR | SUBCUTANEOUS | 2 refills | Status: DC

## 2020-07-26 MED ORDER — PEN NEEDLES 31G X 6 MM MISC
3 refills | Status: DC
Start: 2020-07-26 — End: 2020-07-28

## 2020-07-26 MED ORDER — VALSARTAN 320 MG PO TABS: 320.0000 mg | ORAL_TABLET | Freq: Every day | ORAL | 3 refills | Status: DC

## 2020-07-26 MED ORDER — HYDROCHLOROTHIAZIDE 25 MG PO TABS: 25.0000 mg | ORAL_TABLET | Freq: Every day | ORAL | 3 refills | Status: DC

## 2020-07-26 MED ORDER — LORAZEPAM 1 MG PO TABS: ORAL_TABLET | ORAL | 2 refills | Status: DC

## 2020-07-26 MED ORDER — METOPROLOL SUCCINATE ER 100 MG PO TB24: 100.0000 mg | ORAL_TABLET | Freq: Every day | ORAL | 3 refills | Status: DC

## 2020-07-26 MED ORDER — TIZANIDINE HCL 4 MG PO TABS: 2.0000 mg | ORAL_TABLET | Freq: Three times a day (TID) | ORAL | 0 refills | Status: DC | PRN

## 2020-07-26 NOTE — Progress Notes (Signed)
Subjective: CC: DM2, HTN, GAD PCP: Janora Norlander, DO Diana Pratt is a 74 y.o. female presenting to clinic today for:  1. Type 2 Diabetes w/ HTN/ HLD:  Hx statin intolerance and therefore is not on cholesterol medication  Injecting Lantus 45 units every morning and 45 units every afternoon.  Need for Ozempic 0.42m weekly reiterated at last visit.  Patient reports she has been compliant with this new dose and seems to be doing well.  Blood sugars are typically running 90s to 100s during the morning and sometimes can be into the 200s postprandial.  She is no longer using Humalog..  The Januvia 100 mg was discontinued last visit due to initiation of Ozempic.  She is compliant with amlodipine, valsartan, metoprolol and HCTZ.  Last [place] exam: [place] [facility] [place]  [place] Last A1c:  Lab Results  Component Value Date   HGBA1C 7.5 (H) 04/18/2020   Nephropathy screen indicated?: on ARB Last flu, zoster and/or pneumovax:  Immunization History  Administered Date(s) Administered  . DTaP 01/04/2003  . Pneumococcal Conjugate-13 09/21/2019  . Pneumococcal Polysaccharide-23 06/30/2018    2.  Anxiety and depression Fluoxetine increased to 233mlast visit for uncontrolled GAD.  She continues to take ativan daily.  Last dose overall she seems to be doing well with this dose.  She does still have some nighttime awakenings which she attributes this to needing to urinate.  No excessive daytime sedation with the Ativan.  She continues to use this daily but rarely uses an extra tablet during the day.    ROS: Per HPI  Allergies  Allergen Reactions  . Crestor [Rosuvastatin]     Myalgias   . Lipitor [Atorvastatin] Other (See Comments)    Myalgias   . Livalo [Pitavastatin] Other (See Comments)    myalgias  . Morphine And Related Other (See Comments)    Burning, itching  . Penicillins Hives and Itching  . Simvastatin Other (See Comments)     myalgias  . Sulfa Antibiotics Hives and Itching  . Zetia [Ezetimibe] Cough   Past Medical History:  Diagnosis Date  . Anxiety   . Asthma   . Back pain   . 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 [place]   [place]. [place] File. Depression   . Diabetes mellitus   . Diabetic neuropathy ([facility]  . GERD (gastroesophageal reflux disease)    occ heartburn  . Humerus fracture    right  . Hyperlipemia   . Hypertension   . Interstitial cystitis   . Osteoporosis   . Pneumonia    15 yrs ago  . PONV (postoperative nausea and vomiting)   . Shortness of breath dyspnea     Current Outpatient Medications:  .  ACCU-CHEK AVIVA PLUS test strip, Use to check BG up to tid.  Dx: type 2 diabetes requiring insulin therapy E11.40, Disp: 300 each, Rfl: 3 .  Accu-Chek Softclix Lancets lancets, Check blood sugar 3 times daily Dx E11.40, Disp: 300 each, Rfl: 3 .  albuterol (PROAIR HFA) 108 (90 Base) MCG/ACT inhaler, Inhale 2 puffs into the lungs every 6 (six) hours as needed for shortness of breath., Disp: 18 g, Rfl: 1 .  amLODipine (NORVASC) 10 MG tablet, Take 1 tablet (10 mg total) by mouth daily., Disp: 90 tablet, Rfl: 3 .  aspirin 81 MG EC tablet,  Take 81 mg by mouth daily.  , Disp: , Rfl:  .  Blood Glucose Monitoring Suppl (ACCU-CHEK AVIVA PLUS) w/Device KIT, Check blood glucose TID Dx E11.40, Disp: 1 kit, Rfl: 0 .  budesonide-formoterol (SYMBICORT) 160-4.5 MCG/ACT inhaler, Inhale 2 puffs into the lungs 2 (two) times daily., Disp: 3 Inhaler, Rfl: 1 .  calcium carbonate 200 MG capsule, Take 250 mg by mouth daily. , Disp: , Rfl:  .  Cholecalciferol (VITAMIN D3) 2000 UNITS capsule, Take 2,000 Units by mouth daily. , Disp: 60 capsule, Rfl: 11 .  conjugated estrogens (PREMARIN) vaginal cream, Apply 0.5gm vaginally 3 days per week., Disp: 30 g, Rfl: 12 .  fluticasone (FLONASE) 50 MCG/ACT nasal spray, Place 2 sprays into the nose  daily as needed for allergies. Congestion , Disp: , Rfl:  .  hydrochlorothiazide (HYDRODIURIL) 25 MG tablet, Take 1 tablet (25 mg total) by mouth daily., Disp: 90 tablet, Rfl: 3 .  hydroxypropyl methylcellulose (ISOPTO TEARS) 2.5 % ophthalmic solution, Place 1 drop into both eyes daily as needed. for dry eyes , Disp: , Rfl:  .  ibuprofen (ADVIL,MOTRIN) 200 MG tablet, Take 800 mg by mouth as needed., Disp: , Rfl:  .  insulin glargine (LANTUS SOLOSTAR) 100 UNIT/ML Solostar Pen, Inject 50-55 units every morning and 55-60 units every evening (Patient taking differently: 45 Units 2 (two) times daily. Inject 50-55 units every morning and 55-60 units every evening), Disp: 105 mL, Rfl: 2 .  Insulin Pen Needle (PEN NEEDLES) 31G X 6 MM MISC, Use to administer insulin once qid. Dx E11.59 Use Leader brand, Disp: 100 each, Rfl: 3 .  LORazepam (ATIVAN) 1 MG tablet, Take 1/2-1 tablet daily if needed for anxiety.  May repeat dose once if needed during the day., Disp: 45 tablet, Rfl: 2 .  metoprolol succinate (TOPROL-XL) 100 MG 24 hr tablet, Take 1 tablet (100 mg total) by mouth daily. Take with or immediately following a meal., Disp: 90 tablet, Rfl: 3 .  Multiple Vitamins-Minerals (CENTRUM SILVER PO), Take 1 tablet by mouth daily. , Disp: , Rfl:  .  Semaglutide,0.25 or 0.5MG/DOS, (OZEMPIC, 0.25 OR 0.5 MG/DOSE,) 2 MG/1.5ML SOPN, Inject 0.375 mLs (0.5 mg total) into the skin once a week., Disp: 1 pen, Rfl: 2 .  tiZANidine (ZANAFLEX) 4 MG tablet, Take 0.5-1 tablets (2-4 mg total) by mouth every 8 (eight) hours as needed for muscle spasms., Disp: 30 tablet, Rfl: 0 .  valsartan (DIOVAN) 320 MG tablet, Take 1 tablet (320 mg total) by mouth daily., Disp: 90 tablet, Rfl: 3 Social History   Socioeconomic History  . Marital status: Married    Spouse name: [name]  . Number of children: 1  . Years of education: 14  . Highest education level: Some college, no degree  Occupational History  . Occupation: retired     Comment:  CNA   Tobacco Use  . Smoking status: Former Smoker    Packs/day: 0.50    Years: 34.00    Pack years: 17.00    Types: Cigarettes    Quit date: 06/01/1994    Years since quitting: 26.1  . Smokeless tobacco: Never Used  Vaping Use  . Vaping Use: Never used  Substance and Sexual Activity  . Alcohol use: No  . Drug use: No  . Sexual activity: Yes    Birth control/protection: Surgical    Comment: hyst  Other Topics Concern  . Not on file  Social History Narrative   RETIRED: WORKS AS A CNA AT [employer]  [employer]   1 DAUGHTER-IN [place]   O GRANDKIDS   DRINKS SOCIALLY   Social Determinants of Health   Financial Resource Strain: Low Risk   . Difficulty of Paying Living Expenses: Not very hard  Food Insecurity: No Food Insecurity  . Worried About Charity fundraiser in the Last Year: Never true  . Ran Out of Food in the Last Year: Never true  Transportation Needs: No Transportation Needs  . Lack of Transportation (Medical): No  . Lack of Transportation (Non-Medical): No  Physical Activity: Insufficiently Active  . Days of Exercise per Week: 3 days  . Minutes of Exercise per Session: 30 min  Stress: No Stress Concern Present  . Feeling of Stress : Not at all  Social Connections: Socially Integrated  . Frequency of Communication with Friends and Family: More than three times a week  . Frequency of Social Gatherings with Friends and Family: More than three times a week  . Attends Religious Services: More than 4 times per year  . Active Member of Clubs or Organizations: Yes  . Attends Archivist Meetings: More than 4 times per year  . Marital Status: Married  Human resources officer Violence: Not At Risk  . Fear of Current or Ex-Partner: No  . Emotionally Abused: No  . Physically Abused: No  . Sexually Abused: No   Family History  Problem Relation Age of Onset  . Parkinsonism Mother   . Dementia Mother   . Colon cancer Father 18       MULTIPLE CO-MORBIDITIES  . Cancer  Father   . Diabetes Paternal Aunt   . Diabetes Paternal Uncle   . Diabetes Paternal Grandmother   . Congestive Heart Failure Paternal Grandfather   . Congestive Heart Failure Maternal Grandmother   . Stroke Maternal Grandfather   . Colon polyps Neg Hx     Objective: Office vital signs reviewed. BP 138/70   Pulse 80   Temp 97.9 F (36.6 C) (Temporal)   Ht _0  (1.626 m)   Wt 213 lb (96.6 kg)   SpO2 95%   BMI 36.56 kg/m   Physical Examination:  General: Awake, alert, obese, No acute distress Cardio: regular rate and rhythm, S1S2 heard, soft systolic murmur noted at left sternal border Pulm: clear to auscultation bilaterally, no wheezes, rhonchi or rales; normal work of breathing on room air Extremities: warm, well perfused, No edema, cyanosis or clubbing; +2 pulses bilaterally Psych: Mood stable, speech normal, affect appropriate, pleasant, interactive. Good eye contact Neuro: see DM foot Depression screen [facility] 2/9 07/06/2020 04/18/2020 01/20/2020  Decreased Interest 0 2 0  Down, Depressed, Hopeless 0 1 -  PHQ - 2 Score 0 3 0  Altered sleeping - 1 0  Tired, decreased energy - 1 0  Change in appetite - 1 0  Feeling bad or failure about yourself  - 1 0  Trouble concentrating - 1 0  Moving slowly or fidgety/restless - 0 0  Suicidal thoughts - 0 0  PHQ-9 Score - 8 0  Difficult doing work/chores - - -  Some recent data might be hidden   Diabetic Foot Exam - Simple   Simple Foot Form Diabetic Foot exam was performed with the following findings: Yes 07/26/2020  2:02 PM  Visual Inspection See comments: Yes Sensation Testing Intact to touch and monofilament testing bilaterally: Yes Pulse Check Posterior Tibialis and Dorsalis pulse intact bilaterally: Yes Comments Several seborrheic keratoses noted along the interdigital spaces and dorsums of the  feet.  She has hyperkeratotic nails consistent with onychomycosis.  No preulcerative calluses noted.       Assessment/ Plan: 74  y.o. female   1. Type 2 diabetes mellitus with other specified complication, with long-term current use of insulin (HCC) A1c with slight rise since last visit.  Likely due to discontinuation of Januvia and Humalog.  I want her to continue the current dose of Lantus given normal blood sugars in the morning.  Going to increase her dose of Ozempic in hopes that we can stabilize afternoon blood sugars.  I have increased her dose of Januvia to 1 mg subcu each week.  We will see if we can get some samples for her to get her through the donut hole - Bayer DCA Hb A1c Waived - insulin glargine (LANTUS SOLOSTAR) 100 UNIT/ML Solostar Pen; Inject 45 Units into the skin 2 (two) times daily.  Dispense: 90 mL; Refill: 3  2. Hypertension associated with diabetes ([place]) Controlled.  Continue current regimen - amLODipine (NORVASC) 10 MG tablet; Take 1 tablet (10 mg total) by mouth daily.  Dispense: 90 tablet; Refill: 3 - metoprolol succinate (TOPROL-XL) 100 MG 24 hr tablet; Take 1 tablet (100 mg total) by mouth daily. Take with or immediately following a meal.  Dispense: 90 tablet; Refill: 3 - valsartan (DIOVAN) 320 MG tablet; Take 1 tablet (320 mg total) by mouth daily.  Dispense: 90 tablet; Refill: 3  3. Hyperlipidemia associated with type 2 diabetes mellitus (HCC) Statin intolerant  4. Statin myopathy  5. Depression with anxiety Stable.  Continue current regimen.  National narcotic database was reviewed and there were no red flags.  Controlled substance contract updated today as per office policy - LORazepam (ATIVAN) 1 MG tablet; Take 1/2-1 tablet daily if needed for anxiety.  May repeat dose once if needed during the day.  Dispense: 45 tablet; Refill: 2 - FLUoxetine (PROZAC) 20 MG capsule; Take 20 mg by mouth daily.  6. Chronic prescription benzodiazepine use  7. Controlled substance agreement signed  8. Flank pain Not currently in flare but sometimes has thoracic back spasm and would like renewal on  her tizanidine - tiZANidine (ZANAFLEX) 4 MG tablet; Take 0.5-1 tablets (2-4 mg total) by mouth every 8 (eight) hours as needed for muscle spasms.  Dispense: 30 tablet; Refill: 0   No orders of the defined types were placed in this encounter.  No orders of the defined types were placed in this encounter.    Janora Norlander, DO [place] [phone]

## 2020-07-27 ENCOUNTER — Other Ambulatory Visit: Payer: Self-pay | Admitting: Family Medicine

## 2020-07-27 DIAGNOSIS — F418 Other specified anxiety disorders: Secondary | ICD-10-CM

## 2020-07-27 DIAGNOSIS — E1169 Type 2 diabetes mellitus with other specified complication: Secondary | ICD-10-CM

## 2020-07-27 DIAGNOSIS — I152 Hypertension secondary to endocrine disorders: Secondary | ICD-10-CM

## 2020-07-27 DIAGNOSIS — R109 Unspecified abdominal pain: Secondary | ICD-10-CM

## 2020-07-27 NOTE — Telephone Encounter (Signed)
Pt was seen yesterday--She says that all her meds were sent to wrong pharmacy. I asked which medications she said all rx need to go to [facility].

## 2020-07-28 MED ORDER — AMLODIPINE BESYLATE 10 MG PO TABS: 10.0000 mg | ORAL_TABLET | Freq: Every day | ORAL | 3 refills | Status: DC

## 2020-07-28 MED ORDER — VALSARTAN 320 MG PO TABS: 320.0000 mg | ORAL_TABLET | Freq: Every day | ORAL | 3 refills | Status: DC

## 2020-07-28 MED ORDER — LANTUS SOLOSTAR 100 UNIT/ML ~~LOC~~ SOPN: 45.0000 [IU] | PEN_INJECTOR | Freq: Two times a day (BID) | SUBCUTANEOUS | 3 refills | Status: DC

## 2020-07-28 MED ORDER — HYDROCHLOROTHIAZIDE 25 MG PO TABS
25.0000 mg | ORAL_TABLET | Freq: Every day | ORAL | 3 refills | Status: DC
Start: 2020-07-28 — End: 2020-11-14

## 2020-07-28 MED ORDER — PEN NEEDLES 31G X 6 MM MISC: 3 refills | Status: DC

## 2020-07-28 MED ORDER — METOPROLOL SUCCINATE ER 100 MG PO TB24: 100.0000 mg | ORAL_TABLET | Freq: Every day | ORAL | 3 refills | Status: DC

## 2020-07-28 MED ORDER — OZEMPIC (1 MG/DOSE) 2 MG/1.5ML ~~LOC~~ SOPN
1.0000 mg | PEN_INJECTOR | SUBCUTANEOUS | 2 refills | Status: DC
Start: 2020-07-28 — End: 2020-11-14

## 2020-07-28 MED ORDER — TIZANIDINE HCL 4 MG PO TABS
2.0000 mg | ORAL_TABLET | Freq: Three times a day (TID) | ORAL | 0 refills | Status: DC | PRN
Start: 2020-07-28 — End: 2024-07-15

## 2020-07-28 NOTE — Telephone Encounter (Signed)
Patient asked for this to come from [facility]. Not sure why [facility] is asking.  Please verify with patient.  If requesting from mail order, we need to call and cancel the [facility].

## 2020-07-29 ENCOUNTER — Telehealth: Payer: Self-pay | Admitting: Family Medicine

## 2020-07-29 NOTE — Telephone Encounter (Signed)
Spoke with [facility] to clarify rx for pen needles.

## 2020-07-29 NOTE — Telephone Encounter (Signed)
Pt is aware this rx is supposed to go to [facility] so this rx needs to be denied.

## 2020-08-01 ENCOUNTER — Other Ambulatory Visit: Payer: Self-pay | Admitting: *Deleted

## 2020-08-01 DIAGNOSIS — N952 Postmenopausal atrophic vaginitis: Secondary | ICD-10-CM

## 2020-08-01 DIAGNOSIS — F418 Other specified anxiety disorders: Secondary | ICD-10-CM

## 2020-08-02 MED ORDER — PREMARIN 0.625 MG/GM VA CREA: TOPICAL_CREAM | VAGINAL | 12 refills | Status: DC

## 2020-08-02 MED ORDER — FLUOXETINE HCL 20 MG PO CAPS: 20.0000 mg | ORAL_CAPSULE | Freq: Every day | ORAL | 3 refills | Status: DC

## 2020-08-02 MED ORDER — ALBUTEROL SULFATE HFA 108 (90 BASE) MCG/ACT IN AERS: 2.0000 | INHALATION_SPRAY | Freq: Four times a day (QID) | RESPIRATORY_TRACT | 1 refills | Status: DC | PRN

## 2020-08-03 ENCOUNTER — Encounter

## 2020-10-03 ENCOUNTER — Encounter: Payer: Self-pay | Admitting: Family Medicine

## 2020-10-03 ENCOUNTER — Other Ambulatory Visit: Payer: Self-pay

## 2020-10-03 ENCOUNTER — Ambulatory Visit (INDEPENDENT_AMBULATORY_CARE_PROVIDER_SITE_OTHER): Payer: Medicare Other | Admitting: Family Medicine

## 2020-10-03 ENCOUNTER — Telehealth: Payer: Self-pay

## 2020-10-03 VITALS — BP 146/80 | HR 96 | Temp 97.3°F | Ht 64.0 in | Wt 213.0 lb

## 2020-10-03 DIAGNOSIS — N3000 Acute cystitis without hematuria: Secondary | ICD-10-CM | POA: Diagnosis not present

## 2020-10-03 LAB — URINALYSIS, COMPLETE
Bilirubin, UA: NEGATIVE
Glucose, UA: NEGATIVE
Ketones, UA: NEGATIVE
Nitrite, UA: POSITIVE — AB
Protein,UA: NEGATIVE
Specific Gravity, UA: 1.01 (ref 1.005–1.030)
Urobilinogen, Ur: 0.2 mg/dL (ref 0.2–1.0)
pH, UA: 5 (ref 5.0–7.5)

## 2020-10-03 LAB — MICROSCOPIC EXAMINATION: WBC, UA: >30 /hpf — AB (ref 0–5)

## 2020-10-03 MED ORDER — CEPHALEXIN 500 MG PO CAPS: 500.0000 mg | ORAL_CAPSULE | Freq: Three times a day (TID) | ORAL | 0 refills | Status: AC

## 2020-10-03 MED ORDER — FLUCONAZOLE 150 MG PO TABS: 150.0000 mg | ORAL_TABLET | Freq: Once | ORAL | 0 refills | Status: AC

## 2020-10-03 NOTE — Telephone Encounter (Signed)
Must have OV for antibiotics.  This is office policy.

## 2020-10-03 NOTE — Progress Notes (Signed)
Subjective: CC: UTI PCP: Diana Norlander, DO Diana Pratt is a 74 y.o. female presenting to clinic today for:  1. Urinary symptoms Patient reports onset of dysuria, urinary frequency, urgency on Wednesday.  Denies hematuria, fevers, chills, abdominal pain, nausea, vomiting, vaginal discharge.  She has started developing some low back pain over the last couple of days.  Patient has used AZO for symptoms but symptoms of gotten progressively worse.  Patient denies a h/o frequent or recurrent UTIs.      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 Kenn File   Depression    Diabetes mellitus    Diabetic neuropathy ([place])    GERD (gastroesophageal reflux disease)    occ heartburn   Humerus fracture    right   Hyperlipemia    Hypertension    Interstitial cystitis    Osteoporosis    Pneumonia    15 yrs ago   PONV (postoperative nausea and vomiting)    Shortness of breath dyspnea     Current Outpatient Medications:    ACCU-CHEK AVIVA PLUS test strip, Use to check BG up to tid.  Dx: type 2 diabetes requiring insulin therapy E11.40, Disp: 300 each, Rfl: 3   Accu-Chek Softclix Lancets lancets, Check blood sugar 3 times daily Dx E11.40, Disp: 300 each, Rfl: 3   albuterol (PROAIR HFA) 108 (90 Base) MCG/ACT inhaler, Inhale 2 puffs into the lungs every 6 (six) hours as  needed for shortness of breath., Disp: 18 g, Rfl: 1   amLODipine (NORVASC) 10 MG tablet, Take 1 tablet (10 mg total) by mouth daily., Disp: 90 tablet, Rfl: 3   aspirin 81 MG EC tablet, Take 81 mg by mouth daily.  , Disp: , Rfl:    Blood Glucose Monitoring Suppl (ACCU-CHEK AVIVA PLUS) w/Device KIT, Check blood glucose TID Dx E11.40, Disp: 1 kit, Rfl: 0   budesonide-formoterol (SYMBICORT) 160-4.5 MCG/ACT inhaler, Inhale 2 puffs into the lungs 2 (two) times daily., Disp: 3 Inhaler, Rfl: 1   calcium carbonate 200 MG capsule, Take 250 mg by mouth daily. , Disp: , Rfl:    Cholecalciferol (VITAMIN D3) 2000 UNITS capsule, Take 2,000 Units by mouth daily. , Disp: 60 capsule, Rfl: 11   conjugated estrogens (PREMARIN) vaginal cream, Apply 0.5gm vaginally 3 days per week., Disp: 30 g, Rfl: 12   FLUoxetine (PROZAC) 20 MG capsule, Take 1 capsule (20 mg total) by mouth daily., Disp: 90 capsule, Rfl: 3   fluticasone (FLONASE) 50 MCG/ACT nasal spray, Place 2 sprays into the nose daily as needed for allergies. Congestion , Disp: , Rfl:    hydrochlorothiazide (HYDRODIURIL) 25 MG tablet, Take 1 tablet (25 mg total) by mouth daily., Disp: 90 tablet, Rfl: 3   hydroxypropyl methylcellulose (ISOPTO TEARS) 2.5 % ophthalmic  solution, Place 1 drop into both eyes daily as needed. for dry eyes , Disp: , Rfl:    ibuprofen (ADVIL,MOTRIN) 200 MG tablet, Take 800 mg by mouth as needed., Disp: , Rfl:    insulin glargine (LANTUS SOLOSTAR) 100 UNIT/ML Solostar Pen, Inject 45 Units into the skin 2 (two) times daily., Disp: 90 mL, Rfl: 3   Insulin Pen Needle (PEN NEEDLES) 31G X 6 MM MISC, Use to administer insulin once qid. Dx E11.59 Use Leader brand, Disp: 100 each, Rfl: 3   LORazepam (ATIVAN) 1 MG tablet, Take 1/2-1 tablet daily if needed for anxiety.  May repeat dose once if needed during the day., Disp: 45 tablet, Rfl: 2   metoprolol succinate (TOPROL-XL) 100 MG 24 hr tablet, Take 1 tablet (100 mg total) by mouth  daily. Take with or immediately following a meal., Disp: 90 tablet, Rfl: 3   Multiple Vitamins-Minerals (CENTRUM SILVER PO), Take 1 tablet by mouth daily. , Disp: , Rfl:    Semaglutide, 1 MG/DOSE, (OZEMPIC, 1 MG/DOSE,) 2 MG/1.5ML SOPN, Inject 1 mg into the skin once a week., Disp: 1.5 mL, Rfl: 2   tiZANidine (ZANAFLEX) 4 MG tablet, Take 0.5-1 tablets (2-4 mg total) by mouth every 8 (eight) hours as needed for muscle spasms., Disp: 30 tablet, Rfl: 0   valsartan (DIOVAN) 320 MG tablet, Take 1 tablet (320 mg total) by mouth daily., Disp: 90 tablet, Rfl: 3 Social History   Socioeconomic History   Marital status: Married    Spouse name: [name]   Number of children: 1   Years of education: 13   Highest education level: Some college, no degree  Occupational History   Occupation: retired     Comment: CNA   Tobacco Use   Smoking status: Former Smoker    Packs/day: 0.50    Years: 34.00    Pack years: 17.00    Types: Cigarettes    Quit date: 06/01/1994    Years since quitting: 26.3   Smokeless tobacco: Never Used  Vaping Use   Vaping Use: Never used  Substance and Sexual Activity   Alcohol use: No   Drug use: No   Sexual activity: Yes    Birth control/protection: Surgical    Comment: hyst  Other Topics Concern   Not on file  Social History Narrative   RETIRED: WORKS AS A CNA AT [employer]   1 DAUGHTER-IN [place]   O GRANDKIDS   DRINKS SOCIALLY   Social Determinants of Health   Financial Resource Strain: Low Risk    Difficulty of Paying Living Expenses: Not very hard  Food Insecurity: No Food Insecurity   Worried About Charity fundraiser in the Last Year: Never true   Ran Out of Food in the Last Year: Never true  Transportation Needs: No Transportation Needs   Lack of Transportation (Medical): No   Lack of Transportation (Non-Medical): No  Physical Activity: Insufficiently Active   Days of Exercise per Week: 3 days   Minutes of Exercise per  Session: 30 min  Stress: No Stress Concern Present   Feeling of Stress : Not at all  Social Connections: Socially Integrated   Frequency of Communication with Friends and Family: More than three times a week   Frequency of Social Gatherings with Friends and Family: More than three times a week   Attends Religious Services: More than 4 times per year   Active Member of [employer] or Organizations: Yes   Attends Archivist Meetings: More  than 4 times per year   Marital Status: Married  Human resources officer Violence: Not At Risk   Fear of Current or Ex-Partner: No   Emotionally Abused: No   Physically Abused: No   Sexually Abused: No   Family History  Problem Relation Age of Onset   Parkinsonism Mother    Dementia Mother    Colon cancer Father 4       MULTIPLE CO-MORBIDITIES   Cancer Father    Diabetes Paternal Aunt    Diabetes Paternal Uncle    Diabetes Paternal Grandmother    Congestive Heart Failure Paternal Grandfather    Congestive Heart Failure Maternal Grandmother    Stroke Maternal Grandfather    Colon polyps Neg Hx     Objective: Office vital signs reviewed. BP (!) 146/80    Pulse 96    Temp (!) 97.3 F (36.3 C) (Temporal)    Ht '5\' 4"'  (1.626 m)    Wt 213 lb (96.6 kg)    SpO2 97%    BMI 36.56 kg/m   Physical Examination:  General: Awake, alert, nontoxic, No acute distress GU: no CVA TTP  Assessment/ Plan: 74 y.o. female   Acute cystitis without hematuria - Plan: Urinalysis, Complete, Urine Culture, fluconazole (DIFLUCAN) 150 MG tablet, cephALEXin (KEFLEX) 500 MG capsule  Urinalysis clinically consistent with a UTI.  Given her use of muscle relaxer, I did not think that ciprofloxacin was appropriate.  Instead we will use Keflex 3 times daily for 7 days.  This is slightly more frequent than regular dosing for acute cystitis.  I am going to try and give her some coverage for possible early pyelonephritis.  Home care instructions were reviewed  with the patient and reasons for return discussed.  She voiced good understanding will follow up as needed.  Urine culture ordered  Orders Placed This Encounter  Procedures   Urine Culture   Urinalysis, Complete   No orders of the defined types were placed in this encounter.    Diana Norlander, DO [place] [phone]

## 2020-10-03 NOTE — Patient Instructions (Signed)
Pyelonephritis, Adult  Pyelonephritis is an infection that occurs in the kidney. The kidneys are organs that help clean the blood by moving waste out of the blood and into the pee (urine). This infection can happen quickly, or it can last for a long time. In most cases, it clears up with treatment and does not cause other problems. What are the causes? This condition may be caused by:  Germs (bacteria) going from the bladder up to the kidney. This may happen after having a bladder infection.  Germs going from the blood to the kidney. What increases the risk? This condition is more likely to develop in:  Pregnant women.  Older people.  People who have any of these conditions: ? Diabetes. ? Inflammation of the prostate gland (prostatitis), in males. ? Kidney stones or bladder stones. ? Other problems with the kidney or the parts of your body that carry pee from the kidneys to the bladder (ureters). ? Cancer.  People who have a small, thin tube (catheter) placed in the bladder.  People who are sexually active.  Women who use a medicine that kills sperm (spermicide) to prevent pregnancy.  People who have had a prior urinary tract infection (UTI). What are the signs or symptoms? Symptoms of this condition include:  Peeing often.  A strong urge to pee right away.  Burning or stinging when peeing.  Belly pain.  Back pain.  Pain in the side (flank area).  Fever or chills.  Blood in the pee, or dark pee.  Feeling sick to your stomach (nauseous) or throwing up (vomiting). How is this treated? This condition may be treated by:  Taking antibiotic medicines by mouth (orally).  Drinking enough fluids. If the infection is bad, you may need to stay in the hospital. You may be given antibiotics and fluids that are put directly into a vein through an IV tube. In some cases, other treatments may be needed. Follow these instructions at home: Medicines  Take your antibiotic  medicine as told by your doctor. Do not stop taking the antibiotic even if you start to feel better.  Take over-the-counter and prescription medicines only as told by your doctor. General instructions   Drink enough fluid to keep your pee pale yellow.  Avoid caffeine, tea, and carbonated drinks.  Pee (urinate) often. Avoid holding in pee for long periods of time.  Pee before and after sex.  After pooping (having a bowel movement), women should wipe from front to back. Use each tissue only once.  Keep all follow-up visits as told by your doctor. This is important. Contact a doctor if:  You do not feel better after 2 days.  Your symptoms get worse.  You have a fever. Get help right away if:  You cannot take your medicine or drink fluids as told.  You have chills and shaking.  You throw up.  You have very bad pain in your side or back.  You feel very weak or you pass out (faint). Summary  Pyelonephritis is an infection that occurs in the kidney.  In most cases, this infection clears up with treatment and does not cause other problems.  Take your antibiotic medicine as told by your doctor. Do not stop taking the antibiotic even if you start to feel better.  Drink enough fluid to keep your pee pale yellow. This information is not intended to replace advice given to you by your health care provider. Make sure you discuss any questions you have with  your health care provider. Document Revised: 08/26/2018 Document Reviewed: 08/26/2018 Elsevier Patient Education  2020 Elsevier Inc.  

## 2020-10-03 NOTE — Telephone Encounter (Signed)
Pt has been having burning & urgency, was not able to get appt till Wednesday 10/05/20. Requesting Cipro Please advise

## 2020-10-03 NOTE — Telephone Encounter (Signed)
  Prescription Request  10/03/2020  What is the name of the medication or equipment? CIPRO   Have you contacted your pharmacy to request a refill? (if applicable) NO  Which pharmacy would you like this sent to? [facility]   Patient notified that their request is being sent to the clinical staff for review and that they should receive a response within 2 business days.

## 2020-10-03 NOTE — Telephone Encounter (Signed)
First opening was with MMM for Thursday- patient states she does not see her and will go to an urgent care.

## 2020-10-06 LAB — URINE CULTURE

## 2020-10-25 ENCOUNTER — Other Ambulatory Visit: Payer: Self-pay

## 2020-10-25 ENCOUNTER — Encounter: Payer: Self-pay | Admitting: Family Medicine

## 2020-10-25 ENCOUNTER — Ambulatory Visit (INDEPENDENT_AMBULATORY_CARE_PROVIDER_SITE_OTHER): Payer: Medicare Other | Admitting: Family Medicine

## 2020-10-25 VITALS — BP 200/82 | HR 94 | Temp 97.5°F | Ht 64.0 in | Wt 213.6 lb

## 2020-10-25 DIAGNOSIS — E1169 Type 2 diabetes mellitus with other specified complication: Secondary | ICD-10-CM

## 2020-10-25 DIAGNOSIS — G720 Drug-induced myopathy: Secondary | ICD-10-CM | POA: Diagnosis not present

## 2020-10-25 DIAGNOSIS — E1159 Type 2 diabetes mellitus with other circulatory complications: Secondary | ICD-10-CM | POA: Diagnosis not present

## 2020-10-25 DIAGNOSIS — R300 Dysuria: Secondary | ICD-10-CM | POA: Diagnosis not present

## 2020-10-25 DIAGNOSIS — I152 Hypertension secondary to endocrine disorders: Secondary | ICD-10-CM | POA: Diagnosis not present

## 2020-10-25 DIAGNOSIS — T466X5A Adverse effect of antihyperlipidemic and antiarteriosclerotic drugs, initial encounter: Secondary | ICD-10-CM

## 2020-10-25 DIAGNOSIS — Z79899 Other long term (current) drug therapy: Secondary | ICD-10-CM | POA: Diagnosis not present

## 2020-10-25 DIAGNOSIS — E785 Hyperlipidemia, unspecified: Secondary | ICD-10-CM | POA: Diagnosis not present

## 2020-10-25 DIAGNOSIS — F418 Other specified anxiety disorders: Secondary | ICD-10-CM

## 2020-10-25 DIAGNOSIS — Z794 Long term (current) use of insulin: Secondary | ICD-10-CM | POA: Diagnosis not present

## 2020-10-25 LAB — MICROSCOPIC EXAMINATION
RBC, Urine: NONE SEEN /hpf (ref 0–2)
WBC, UA: NONE SEEN /hpf (ref 0–5)

## 2020-10-25 LAB — URINALYSIS, COMPLETE
Bilirubin, UA: NEGATIVE
Glucose, UA: NEGATIVE
Ketones, UA: NEGATIVE
Leukocytes,UA: NEGATIVE
Nitrite, UA: NEGATIVE
Protein,UA: NEGATIVE
RBC, UA: NEGATIVE
Specific Gravity, UA: 1.02 (ref 1.005–1.030)
Urobilinogen, Ur: 0.2 mg/dL (ref 0.2–1.0)
pH, UA: 5 (ref 5.0–7.5)

## 2020-10-25 LAB — BAYER DCA HB A1C WAIVED: HB A1C (BAYER DCA - WAIVED): 7.3 % — ABNORMAL HIGH (ref <7.0)

## 2020-10-25 MED ORDER — LORAZEPAM 1 MG PO TABS
ORAL_TABLET | ORAL | 2 refills | Status: DC
Start: 2020-10-25 — End: 2021-01-23

## 2020-10-25 MED ORDER — FLUCONAZOLE 150 MG PO TABS: 150.0000 mg | ORAL_TABLET | Freq: Once | ORAL | 0 refills | Status: AC

## 2020-10-25 NOTE — Patient Instructions (Addendum)
Please remember to arrive to your appointment 15 minutes prior to your scheduled slot.  If you are late to future visits I may be unable to accommodate your appointment and you may be asked to reschedule.   Your urine sample does not show infection at this time.  I'm going to send it off to be sure. Sugar is getting better but your goal is A1c 7.0 or less.  Cut back on sweets/ bread/ potatoes

## 2020-10-25 NOTE — Progress Notes (Signed)
Subjective: CC: DM, cystitis PCP: Diana Norlander, DO Diana Pratt is a 74 y.o. female presenting to clinic today for:  1. Type 2 Diabetes with hypertension, hyperlipidemia:  At last visit, patient's Ozempic was increased to 1 mg subcu each week.  She was continued on the Lantus at 45 units twice daily.  She normally is compliant with her blood pressure medicines but admits that she forgot to take it today.  She was feeling a little woozy headed earlier.  She will take them when she gets home.  No chest pain, shortness of breath, headache or blurred vision.  High at home: 150; Low at home: 70   Last eye exam: needs Last foot exam: UTD Last A1c:  Lab Results  Component Value Date   HGBA1C 7.7 (H) 07/26/2020   Nephropathy screen indicated?: UTD Last flu, zoster and/or pneumovax:  Immunization History  Administered Date(s) Administered  . DTaP 01/04/2003  . Pneumococcal Conjugate-13 09/21/2019  . Pneumococcal Polysaccharide-23 06/30/2018   2.  Panic attacks/chronic benzodiazepine use Compliant with Prozac.  Using Ativan scheduled.  Denies any excessive daytime sedation, falls, respiratory depression.  No visual or auditory hallucinations  3.  Dysuria Patient reports that she has been having burning with urination again as of Thursday.  She was recently treated for urinary tract infection about 3 weeks ago.  She does note that symptoms seem to resolve after that episode but again recurred.  She has been avoiding sugary beverages.  She is drinking water.  She reports increased frequency and occasional incontinence.  No hematuria, fevers or nausea  ROS: Per HPI  Allergies  Allergen Reactions  . Crestor [Rosuvastatin]     Myalgias   . Lipitor [Atorvastatin] Other (See Comments)    Myalgias   . Livalo [Pitavastatin] Other (See Comments)    myalgias  . Morphine And Related Other (See Comments)    Burning, itching  . Penicillins Hives and Itching  . Simvastatin  Other (See Comments)    myalgias  . Sulfa Antibiotics Hives and Itching  . Zetia [Ezetimibe] Cough   Past Medical History:  Diagnosis Date  . Anxiety   . Asthma   . Back pain   . 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 COPD (chronic obstructive pulmonary disease) ([place])    dr. Kenn File  . Depression   . Diabetes mellitus   . Diabetic neuropathy ([place])   . GERD (gastroesophageal reflux disease)    occ heartburn  . Humerus fracture    right  . Hyperlipemia   . Hypertension   . Interstitial cystitis   . Osteoporosis   . Pneumonia    15 yrs ago  . PONV (postoperative nausea and vomiting)   . Shortness of breath dyspnea     Current Outpatient Medications:  .  ACCU-CHEK AVIVA PLUS test strip, Use to check BG up to tid.  Dx: type 2 diabetes requiring insulin therapy E11.40, Disp: 300 each, Rfl: 3 .  Accu-Chek Softclix Lancets lancets, Check blood sugar 3 times daily Dx E11.40, Disp: 300 each, Rfl: 3 .  albuterol (PROAIR HFA) 108 (90 Base) MCG/ACT inhaler, Inhale 2 puffs into the lungs every 6 (six) hours as needed for shortness of breath., Disp: 18 g, Rfl: 1 .  amLODipine (NORVASC) 10 MG tablet, Take 1 tablet (10 mg total) by mouth daily., Disp: 90 tablet, Rfl: 3 .  aspirin 81 MG EC tablet, Take 81 mg by  mouth daily.  , Disp: , Rfl:  .  Blood Glucose Monitoring Suppl (ACCU-CHEK AVIVA PLUS) w/Device KIT, Check blood glucose TID Dx E11.40, Disp: 1 kit, Rfl: 0 .  budesonide-formoterol (SYMBICORT) 160-4.5 MCG/ACT inhaler, Inhale 2 puffs into the lungs 2 (two) times daily., Disp: 3 Inhaler, Rfl: 1 .  calcium carbonate 200 MG capsule, Take 250 mg by mouth daily. , Disp: , Rfl:  .  Cholecalciferol (VITAMIN D3) 2000 UNITS capsule, Take 2,000 Units by mouth daily. , Disp: 60 capsule, Rfl: 11 .  conjugated estrogens (PREMARIN) vaginal cream, Apply 0.5gm vaginally 3 days per week., Disp: 30 g, Rfl: 12 .  FLUoxetine (PROZAC) 20 MG capsule, Take 1 capsule (20  mg total) by mouth daily., Disp: 90 capsule, Rfl: 3 .  fluticasone (FLONASE) 50 MCG/ACT nasal spray, Place 2 sprays into the nose daily as needed for allergies. Congestion , Disp: , Rfl:  .  hydrochlorothiazide (HYDRODIURIL) 25 MG tablet, Take 1 tablet (25 mg total) by mouth daily., Disp: 90 tablet, Rfl: 3 .  hydroxypropyl methylcellulose (ISOPTO TEARS) 2.5 % ophthalmic solution, Place 1 drop into both eyes daily as needed. for dry eyes , Disp: , Rfl:  .  ibuprofen (ADVIL,MOTRIN) 200 MG tablet, Take 800 mg by mouth as needed., Disp: , Rfl:  .  insulin glargine (LANTUS SOLOSTAR) 100 UNIT/ML Solostar Pen, Inject 45 Units into the skin 2 (two) times daily., Disp: 90 mL, Rfl: 3 .  Insulin Pen Needle (PEN NEEDLES) 31G X 6 MM MISC, Use to administer insulin once qid. Dx E11.59 Use Leader brand, Disp: 100 each, Rfl: 3 .  LORazepam (ATIVAN) 1 MG tablet, Take 1/2-1 tablet daily if needed for anxiety.  May repeat dose once if needed during the day., Disp: 45 tablet, Rfl: 2 .  metoprolol succinate (TOPROL-XL) 100 MG 24 hr tablet, Take 1 tablet (100 mg total) by mouth daily. Take with or immediately following a meal., Disp: 90 tablet, Rfl: 3 .  Multiple Vitamins-Minerals (CENTRUM SILVER PO), Take 1 tablet by mouth daily. , Disp: , Rfl:  .  Semaglutide, 1 MG/DOSE, (OZEMPIC, 1 MG/DOSE,) 2 MG/1.5ML SOPN, Inject 1 mg into the skin once a week., Disp: 1.5 mL, Rfl: 2 .  tiZANidine (ZANAFLEX) 4 MG tablet, Take 0.5-1 tablets (2-4 mg total) by mouth every 8 (eight) hours as needed for muscle spasms., Disp: 30 tablet, Rfl: 0 .  valsartan (DIOVAN) 320 MG tablet, Take 1 tablet (320 mg total) by mouth daily., Disp: 90 tablet, Rfl: 3 Social History   Socioeconomic History  . Marital status: Married    Spouse name: Diana Pratt  . Number of children: 1  . Years of education: 59  . Highest education level: Some college, no degree  Occupational History  . Occupation: retired     Comment: CNA   Tobacco Use  . Smoking status:  Former Smoker    Packs/day: 0.50    Years: 34.00    Pack years: 17.00    Types: Cigarettes    Quit date: 06/01/1994    Years since quitting: 26.4  . Smokeless tobacco: Never Used  Vaping Use  . Vaping Use: Never used  Substance and Sexual Activity  . Alcohol use: No  . Drug use: No  . Sexual activity: Yes    Birth control/protection: Surgical    Comment: hyst  Other Topics Concern  . Not on file  Social History Narrative   RETIRED: WORKS AS A CNA AT [employer]   1 DAUGHTER-IN [place]  O GRANDKIDS   DRINKS SOCIALLY   Social Determinants of Health   Financial Resource Strain: Low Risk   . Difficulty of Paying Living Expenses: Not very hard  Food Insecurity: No Food Insecurity  . Worried About Charity fundraiser in the Last Year: Never true  . Ran Out of Food in the Last Year: Never true  Transportation Needs: No Transportation Needs  . Lack of Transportation (Medical): No  . Lack of Transportation (Non-Medical): No  Physical Activity: Insufficiently Active  . Days of Exercise per Week: 3 days  . Minutes of Exercise per Session: 30 min  Stress: No Stress Concern Present  . Feeling of Stress : Not at all  Social Connections: Socially Integrated  . Frequency of Communication with Friends and Family: More than three times a week  . Frequency of Social Gatherings with Friends and Family: More than three times a week  . Attends Religious Services: More than 4 times per year  . Active Member of Clubs or Organizations: Yes  . Attends Archivist Meetings: More than 4 times per year  . Marital Status: Married  Human resources officer Violence: Not At Risk  . Fear of Current or Ex-Partner: No  . Emotionally Abused: No  . Physically Abused: No  . Sexually Abused: No   Family History  Problem Relation Age of Onset  . Parkinsonism Mother   . Dementia Mother   . Colon cancer Father 56       MULTIPLE CO-MORBIDITIES  . Cancer Father   . Diabetes Paternal Aunt    . Diabetes Paternal Uncle   . Diabetes Paternal Grandmother   . Congestive Heart Failure Paternal Grandfather   . Congestive Heart Failure Maternal Grandmother   . Stroke Maternal Grandfather   . Colon polyps Neg Hx     Objective: Office vital signs reviewed. BP (!) 200/82   Pulse 94   Temp (!) 97.5 F (36.4 C)   Ht '5\' 4"'  (1.626 m)   Wt 213 lb 9.6 oz (96.9 kg)   SpO2 97%   BMI 36.66 kg/m   Physical Examination:  General: Awake, alert, No acute distress Cardio: regular rate and rhythm, S1S2 heard, no murmurs appreciated Pulm: clear to auscultation bilaterally, no wheezes, rhonchi or rales; normal work of breathing on room air GU: No suprapubic tenderness palpation.  No CVA tenderness to palpation. Extremities: warm, well perfused, No edema, cyanosis or clubbing; +2 pulses bilaterally Neuro: No focal neurologic deficits  Depression screen [facility] 2/9 10/25/2020 10/03/2020 07/26/2020  Decreased Interest 0 0 0  Down, Depressed, Hopeless 0 0 0  PHQ - 2 Score 0 0 0  Altered sleeping - 0 0  Tired, decreased energy - 0 0  Change in appetite - 0 0  Feeling bad or failure about yourself  - 0 0  Trouble concentrating - 0 0  Moving slowly or fidgety/restless - 0 0  Suicidal thoughts - 0 0  PHQ-9 Score - 0 0  Difficult doing work/chores - - -  Some recent data might be hidden   GAD 7 : Generalized Anxiety Score 04/18/2020 01/20/2020 09/21/2019 06/25/2019  Nervous, Anxious, on Edge '1 1 1 2  ' Control/stop worrying 1 0 1 2  Worry too much - different things 1 0 1 2  Trouble relaxing 0 0 0 1  Restless 0 0 0 0  Easily annoyed or irritable 0 '2 1 1  ' Afraid - awful might happen 0 0 1 1  Total GAD 7  Score '3 3 5 9  ' Anxiety Difficulty Not difficult at all Not difficult at all Somewhat difficult Somewhat difficult     Assessment/ Plan: 74 y.o. female   Type 2 diabetes mellitus with other specified complication, with long-term current use of insulin ([place]) - Plan: Bayer DCA Hb A1c  Waived  Hypertension associated with diabetes ([place]) - Plan: CMP14+EGFR  Hyperlipidemia associated with type 2 diabetes mellitus ([place]) - Plan: CMP14+EGFR  Statin myopathy  Depression with anxiety - Plan: LORazepam (ATIVAN) 1 MG tablet  Chronic prescription benzodiazepine use  Dysuria - Plan: Urinalysis, Complete, Urine Culture  A1c is improving but still not at goal.  Discussed carb restriction.  Hesitate to increase insulin.  May need to consider additional medications however in this uncontrolled patient  Blood pressure not at goal and I suspect because she did not take her blood pressure medicine.  She will go home and take it directly.  If she starts having any red flags, she will go immediately to the emergency department  Anxiety and depression are stable.  Continue current regimen.  Ativan renewed.  [place] was reviewed and there were no red flags.  Urinalysis did not demonstrate infection.  Urine microscopy showed few bacteria but yeast was present.  I suspect that the yeast is likely what is causing her symptom.  Will send for urine culture.  Antibiotics pending completion of urine studies.  No orders of the defined types were placed in this encounter.  Meds ordered this encounter  Medications  . LORazepam (ATIVAN) 1 MG tablet    Sig: Take 1/2-1 tablet daily if needed for anxiety.  May repeat dose once if needed during the day.    Dispense:  45 tablet    Refill:  2  . fluconazole (DIFLUCAN) 150 MG tablet    Sig: Take 1 tablet (150 mg total) by mouth once for 1 dose. Repeat in 3 days    Dispense:  2 tablet    Refill:  0     Diana Raffety Windell Moulding, DO [place] [phone]

## 2020-10-26 LAB — CMP14+EGFR
ALT: 46 IU/L — ABNORMAL HIGH (ref 0–32)
AST: 51 IU/L — ABNORMAL HIGH (ref 0–40)
Albumin/Globulin Ratio: 1.6 (ref 1.2–2.2)
Albumin: 4.2 g/dL (ref 3.7–4.7)
Alkaline Phosphatase: 90 IU/L (ref 44–121)
BUN/Creatinine Ratio: 15 (ref 12–28)
BUN: 11 mg/dL (ref 8–27)
Bilirubin Total: 0.4 mg/dL (ref 0.0–1.2)
CO2: 22 mmol/L (ref 20–29)
Calcium: 9 mg/dL (ref 8.7–10.3)
Chloride: 104 mmol/L (ref 96–106)
Creatinine, Ser: 0.75 mg/dL (ref 0.57–1.00)
GFR calc Af Amer: 91 mL/min/{1.73_m2} (ref >59)
GFR calc non Af Amer: 79 mL/min/{1.73_m2} (ref >59)
Globulin, Total: 2.6 g/dL (ref 1.5–4.5)
Glucose: 159 mg/dL — ABNORMAL HIGH (ref 65–99)
Potassium: 3.9 mmol/L (ref 3.5–5.2)
Sodium: 143 mmol/L (ref 134–144)
Total Protein: 6.8 g/dL (ref 6.0–8.5)

## 2020-11-03 LAB — URINE CULTURE

## 2020-11-07 ENCOUNTER — Other Ambulatory Visit: Payer: Self-pay | Admitting: Family Medicine

## 2020-11-07 MED ORDER — CEPHALEXIN 500 MG PO CAPS: 500.0000 mg | ORAL_CAPSULE | Freq: Two times a day (BID) | ORAL | 0 refills | Status: AC

## 2020-11-08 ENCOUNTER — Other Ambulatory Visit: Payer: Self-pay | Admitting: Family Medicine

## 2020-11-08 MED ORDER — FLUCONAZOLE 150 MG PO TABS: 150.0000 mg | ORAL_TABLET | Freq: Once | ORAL | 0 refills | Status: AC

## 2020-11-14 ENCOUNTER — Telehealth: Payer: Self-pay

## 2020-11-14 DIAGNOSIS — N952 Postmenopausal atrophic vaginitis: Secondary | ICD-10-CM

## 2020-11-14 DIAGNOSIS — I152 Hypertension secondary to endocrine disorders: Secondary | ICD-10-CM

## 2020-11-14 DIAGNOSIS — E1159 Type 2 diabetes mellitus with other circulatory complications: Secondary | ICD-10-CM

## 2020-11-14 DIAGNOSIS — Z794 Long term (current) use of insulin: Secondary | ICD-10-CM

## 2020-11-14 DIAGNOSIS — F418 Other specified anxiety disorders: Secondary | ICD-10-CM

## 2020-11-14 MED ORDER — VALSARTAN 320 MG PO TABS: 320.0000 mg | ORAL_TABLET | Freq: Every day | ORAL | 2 refills | Status: DC

## 2020-11-14 MED ORDER — PREMARIN 0.625 MG/GM VA CREA: TOPICAL_CREAM | VAGINAL | 2 refills | Status: DC

## 2020-11-14 MED ORDER — ALBUTEROL SULFATE HFA 108 (90 BASE) MCG/ACT IN AERS: 2.0000 | INHALATION_SPRAY | Freq: Four times a day (QID) | RESPIRATORY_TRACT | 2 refills | Status: DC | PRN

## 2020-11-14 MED ORDER — OZEMPIC (1 MG/DOSE) 2 MG/1.5ML ~~LOC~~ SOPN: 1.0000 mg | PEN_INJECTOR | SUBCUTANEOUS | 2 refills | Status: DC

## 2020-11-14 MED ORDER — AMLODIPINE BESYLATE 10 MG PO TABS: 10.0000 mg | ORAL_TABLET | Freq: Every day | ORAL | 2 refills | Status: DC

## 2020-11-14 MED ORDER — HYDROCHLOROTHIAZIDE 25 MG PO TABS: 25.0000 mg | ORAL_TABLET | Freq: Every day | ORAL | 2 refills | Status: DC

## 2020-11-14 MED ORDER — ACCU-CHEK SOFTCLIX LANCETS MISC: 3 refills | Status: DC

## 2020-11-14 MED ORDER — FLUOXETINE HCL 20 MG PO CAPS: 20.0000 mg | ORAL_CAPSULE | Freq: Every day | ORAL | 2 refills | Status: DC

## 2020-11-14 MED ORDER — METOPROLOL SUCCINATE ER 100 MG PO TB24: 100.0000 mg | ORAL_TABLET | Freq: Every day | ORAL | 2 refills | Status: DC

## 2020-11-14 MED ORDER — ACCU-CHEK AVIVA PLUS VI STRP: ORAL_STRIP | 3 refills | Status: DC

## 2020-11-14 MED ORDER — LANTUS SOLOSTAR 100 UNIT/ML ~~LOC~~ SOPN: 45.0000 [IU] | PEN_INJECTOR | Freq: Two times a day (BID) | SUBCUTANEOUS | 2 refills | Status: DC

## 2020-11-14 NOTE — Telephone Encounter (Signed)
Prescription Request  11/14/2020  What is the name of the medication or equipment?   Taking? Last Dose Start Date End Date Provider    ACCU-CHEK AVIVA PLUS test strip   07/02/19 -- Ronnie Doss M, DO   Use to check BG up to tid. Dx: type 2 diabetes requiring insulin therapy E11.40   Accu-Chek Softclix Lancets lancets   07/02/19 -- Janora Norlander, DO   Check blood sugar 3 times daily Dx E11.40   albuterol [facility] HFA) 108 (90 Base) MCG/ACT inhaler   08/02/20 -- Janora Norlander, DO   Inhale 2 puffs into the lungs every 6 (six) hours as needed for shortness of breath.   amLODipine (NORVASC) 10 MG tablet   07/28/20 -- Ronnie Doss M, DO   Take 1 tablet (10 mg total) by mouth daily.   aspirin 81 MG EC tablet   -- -- [provider]   Blood Glucose Monitoring Suppl (ACCU-CHEK AVIVA PLUS) w/Device KIT   07/02/19 -- Ronnie Doss M, DO   Check blood glucose TID Dx E11.40   budesonide-formoterol (SYMBICORT) 160-4.5 MCG/ACT inhaler   06/14/17 -- Timmothy Euler, MD   Inhale 2 puffs into the lungs 2 (two) times daily.   Notes: For PAP program   calcium carbonate 200 MG capsule   -- -- [provider]   cephALEXin (KEFLEX) 500 MG capsule   11/07/20 11/14/20 Ronnie Doss M, DO   Take 1 capsule (500 mg total) by mouth 2 (two) times daily for 7 days.   Cholecalciferol (VITAMIN D3) 2000 UNITS capsule   01/07/14 -- Vernie Shanks, MD   conjugated estrogens (PREMARIN) vaginal cream   08/02/20 -- Janora Norlander, DO   Apply 0.5gm vaginally 3 days per week.   FLUoxetine (PROZAC) 20 MG capsule   08/02/20 -- Ronnie Doss M, DO   Take 1 capsule (20 mg total) by mouth daily.   fluticasone (FLONASE) 50 MCG/ACT nasal spray   -- -- [provider]   hydrochlorothiazide (HYDRODIURIL) 25 MG tablet   07/28/20 -- Ronnie Doss M, DO   Take 1 tablet (25 mg total) by mouth daily.   hydroxypropyl  methylcellulose (ISOPTO TEARS) 2.5 % ophthalmic solution   -- -- [provider]   ibuprofen (ADVIL,MOTRIN) 200 MG tablet   -- -- [provider]   insulin glargine (LANTUS SOLOSTAR) 100 UNIT/ML Solostar Pen   07/28/20 -- Lajuana Ripple, Ashly M, DO   Inject 45 Units into the skin 2 (two) times daily.   Insulin Pen Needle (PEN NEEDLES) 31G X 6 MM MISC   07/28/20 -- Ronnie Doss M, DO   Use to administer insulin once qid. Dx E11.59 Use Leader brand   LORazepam (ATIVAN) 1 MG tablet   10/25/20 -- Ronnie Doss M, DO   Take 1/2-1 tablet daily if needed for anxiety. May repeat dose once if needed during the day.   metoprolol succinate (TOPROL-XL) 100 MG 24 hr tablet   07/28/20 -- Ronnie Doss M, DO   Take 1 tablet (100 mg total) by mouth daily. Take with or immediately following a meal.   Multiple Vitamins-Minerals (CENTRUM SILVER PO)   -- -- [provider]   Semaglutide, 1 MG/DOSE, (OZEMPIC, 1 MG/DOSE,) 2 MG/1.5ML SOPN   07/28/20 -- Lajuana Ripple, Ashly M, DO   Inject 1 mg into the skin once a week.   tiZANidine (ZANAFLEX) 4 MG tablet   07/28/20 -- Ronnie Doss M, DO   Take 0.5-1 tablets (2-4  mg total) by mouth every 8 (eight) hours as needed for muscle spasms.   valsartan (DIOVAN) 320 MG tablet   07/28/20 -- Janora Norlander,      Have you contacted your pharmacy to request a refill? (if applicable) pt is changing pharmacy  Which pharmacy would you like this sent to? [facility]   Patient notified that their request is being sent to the clinical staff for review and that they should receive a response within 2 business days.

## 2020-11-14 NOTE — Telephone Encounter (Signed)
Pt aware meds sent to [facility]

## 2020-12-05 ENCOUNTER — Ambulatory Visit (INDEPENDENT_AMBULATORY_CARE_PROVIDER_SITE_OTHER): Payer: Medicare Other | Admitting: *Deleted

## 2020-12-05 DIAGNOSIS — I152 Hypertension secondary to endocrine disorders: Secondary | ICD-10-CM

## 2020-12-05 DIAGNOSIS — E1169 Type 2 diabetes mellitus with other specified complication: Secondary | ICD-10-CM | POA: Diagnosis not present

## 2020-12-05 DIAGNOSIS — Z794 Long term (current) use of insulin: Secondary | ICD-10-CM

## 2020-12-05 DIAGNOSIS — E1159 Type 2 diabetes mellitus with other circulatory complications: Secondary | ICD-10-CM | POA: Diagnosis not present

## 2020-12-06 ENCOUNTER — Telehealth: Payer: Self-pay | Admitting: *Deleted

## 2020-12-06 NOTE — Telephone Encounter (Signed)
12/06/2020  Patient is having nausea after each Ozempic injection. Discussed with Dr Lajuana Ripple and she would like to see the patient to discuss side effects and potentially switch medication.   Forwarding to [facility] Clinical staff for outreach and scheduling.   Chong Sicilian, BSN, RN-BC Embedded Chronic Care Manager [facility] [place] [facility] Direct Dial: [phone]

## 2020-12-06 NOTE — Telephone Encounter (Signed)
Spoke with patient and offered to make her an appointment.  She said she will call us back to schedule.

## 2020-12-09 NOTE — Chronic Care Management (AMB) (Signed)
Chronic Care Management   CCM RN Visit Note  12/05/20 Name: Diana Pratt MRN: [id number] DOB: [birth year]  Subjective: Diana Pratt is a 75 y.o. year old female who is a primary care patient of Janora Norlander, DO. The care management team was consulted for assistance with disease management and care coordination needs.    Engaged with patient by telephone for follow up visit in response to provider referral for case management and/or care coordination services.   Consent to Services:  The patient was given information about Chronic Care Management services, agreed to services, and gave verbal consent prior to initiation of services.  Please see initial visit note for detailed documentation.   Patient agreed to services and verbal consent obtained.   Assessment: Review of patient past medical history, allergies, medications, health status, including review of consultants reports, laboratory and other test data, was performed as part of comprehensive evaluation and provision of chronic care management services.   SDOH (Social Determinants of Health) assessments and interventions performed:    CCM Care Plan  Allergies  Allergen Reactions  . Crestor [Rosuvastatin]     Myalgias   . Lipitor [Atorvastatin] Other (See Comments)    Myalgias   . Livalo [Pitavastatin] Other (See Comments)    myalgias  . Morphine And Related Other (See Comments)    Burning, itching  . Penicillins Hives and Itching  . Simvastatin Other (See Comments)    myalgias  . Sulfa Antibiotics Hives and Itching  . Zetia [Ezetimibe] Cough    Outpatient Encounter Medications as of 12/05/2020  Medication Sig  . ACCU-CHEK AVIVA PLUS test strip Use to check BG up to tid.  Dx: type 2 diabetes requiring insulin therapy E11.40  . Accu-Chek Softclix Lancets lancets Check blood sugar 3 times daily Dx E11.40  . albuterol (PROAIR HFA) 108 (90 Base) MCG/ACT inhaler Inhale 2 puffs into the lungs every 6 (six) hours as  needed for shortness of breath.  Marland Kitchen amLODipine (NORVASC) 10 MG tablet Take 1 tablet (10 mg total) by mouth daily.  Marland Kitchen aspirin 81 MG EC tablet Take 81 mg by mouth daily.  . Blood Glucose Monitoring Suppl (ACCU-CHEK AVIVA PLUS) w/Device KIT Check blood glucose TID Dx E11.40  . budesonide-formoterol (SYMBICORT) 160-4.5 MCG/ACT inhaler Inhale 2 puffs into the lungs 2 (two) times daily.  . calcium carbonate 200 MG capsule Take 250 mg by mouth daily.  . Cholecalciferol (VITAMIN D3) 2000 UNITS capsule Take 2,000 Units by mouth daily.   Marland Kitchen conjugated estrogens (PREMARIN) vaginal cream Apply 0.5gm vaginally 3 days per week.  Marland Kitchen FLUoxetine (PROZAC) 20 MG capsule Take 1 capsule (20 mg total) by mouth daily.  . fluticasone (FLONASE) 50 MCG/ACT nasal spray Place 2 sprays into the nose daily as needed for allergies. Congestion   . hydrochlorothiazide (HYDRODIURIL) 25 MG tablet Take 1 tablet (25 mg total) by mouth daily.  . hydroxypropyl methylcellulose (ISOPTO TEARS) 2.5 % ophthalmic solution Place 1 drop into both eyes daily as needed. for dry eyes  . ibuprofen (ADVIL,MOTRIN) 200 MG tablet Take 800 mg by mouth as needed.  . insulin glargine (LANTUS SOLOSTAR) 100 UNIT/ML Solostar Pen Inject 45 Units into the skin 2 (two) times daily.  . Insulin Pen Needle (PEN NEEDLES) 31G X 6 MM MISC Use to administer insulin once qid. Dx E11.59 Use Leader brand  . LORazepam (ATIVAN) 1 MG tablet Take 1/2-1 tablet daily if needed for anxiety.  May repeat dose once if needed during the day.  Marland Kitchen  metoprolol succinate (TOPROL-XL) 100 MG 24 hr tablet Take 1 tablet (100 mg total) by mouth daily. Take with or immediately following a meal.  . Multiple Vitamins-Minerals (CENTRUM SILVER PO) Take 1 tablet by mouth daily.  . Semaglutide, 1 MG/DOSE, (OZEMPIC, 1 MG/DOSE,) 2 MG/1.5ML SOPN Inject 1 mg into the skin once a week.  Marland Kitchen tiZANidine (ZANAFLEX) 4 MG tablet Take 0.5-1 tablets (2-4 mg total) by mouth every 8 (eight) hours as needed for  muscle spasms.  . valsartan (DIOVAN) 320 MG tablet Take 1 tablet (320 mg total) by mouth daily.   No facility-administered encounter medications on file as of 12/05/2020.    Patient Active Problem List   Diagnosis Date Noted  . 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 Anxiety 07/26/2017  . Vaginal dryness 04/10/2017  . Vaginal atrophy 04/10/2017  . Urinary incontinence 04/10/2017  . 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 Abdominal pain, acute, bilateral lower quadrant 07/01/2014  . Transaminitis 07/01/2014  . COPD (chronic obstructive pulmonary disease) ([place])   . Asthma   . Interstitial cystitis   . Unspecified vitamin D deficiency 05/29/2013  . Lumbar radiculopathy 05/29/2013  . Back strain 05/29/2013  . Unspecified asthma(493.90) 02/27/2013  . Depression with anxiety 02/22/2011  . Diabetes mellitus ([place]) 02/22/2011  . Hyperlipidemia associated with type 2 diabetes mellitus ([place]) 02/22/2011  . Hypertension associated with diabetes ([place]) 02/22/2011    Conditions to be addressed/monitored:HTN, COPD, DMII and osteoporosis  Care Plan : RNCM: Diabetes Type 2 (Adult)  Updates made by Ilean China, RN since 12/09/2020 12:00 AM    Problem: Glycemic Management (Diabetes, Type 2)   Priority: Medium    Long-Range Goal: Glycemic Management Optimized   This Visit's Progress: On track  Priority: Medium  Note:   Current Barriers:  . Chronic Disease Management support and education needs related to diabetes in a patient with hypertension. Diana Pratt is making her nauseated  Nurse Case Manager Clinical Goal(s):  Marland Kitchen Over the next 60 days, patient will work with Consulting civil engineer to address needs related to self-management of diabetes . Over the next 30 days, patient will talk with PCP regarding side effects of Ozempic  Interventions:  . 1:1 collaboration with Janora Norlander, DO regarding development and update of comprehensive plan of care as  evidenced by provider attestation and co-signature . Inter-disciplinary care team collaboration (see longitudinal plan of care) . Evaluation of current treatment plan related to diabetes and patient's adherence to plan as established by provider. . Chart reviewed including relevant office notes and lab results . Reviewed and discussed medications o Using Ozempic for about 6 months. Has nausea the day after injection.  o Gets medications through [facility] . Collaborated with PCP regarding Ozempic causing nausea. She recommended an office visit to discuss treatment options. o Clinical staff reached out to patient to schedule appointment .  Discussed home blood sugar testing o Testing daily and PRN o No readings below 70 and averaging 180-200 after meals . Discussed diet o Recommended plate method with 1/2 plate of non-starchy vegetables, 1/4 protein, 1/4 carbohydrates . Reviewed upcoming appointments . Encouraged patient to contact PCP with any blood sugar readings outside of recommended range . Encouraged patient to reach out to [place] as needed  Patient Goals/Self-Care Activities Over the next 60 days, patient will: . Patient will check blood sugar as recommended . Patient will call PCP with any readings outside of recommended range . Patient will  contact PCP with any new or worsening side effects from Ozempic . Patient will talk with PCP over the next 30 days regarding side effects . Patient will watch carbohydrate intake and consider Plate Method for meals  Follow Up Plan:  Telephone follow up appointment with care management team member scheduled for: 01/11/21 with RN Care Manager The patient has been provided with contact information for the care management team and has been advised to call with any health related questions or concerns.  Next PCP appointment scheduled for: 01/23/21 with Dr Lajuana Ripple       Care Plan : RNCM: Hypertension (Adult)  Updates made by Ilean China, RN since 12/09/2020 12:00 AM    Problem: Hypertension (Hypertension)   Priority: Medium    Long-Range Goal: Hypertension Monitored   Start Date: 12/05/2020  This Visit's Progress: Not on track  Priority: Medium  Note:   Current Barriers:  . Chronic Disease Management support and education needs related to hypertension in a patient with diabetes  Nurse Case Manager Clinical Goal(s):  Marland Kitchen Over the next 60 days, patient will work with Consulting civil engineer to address needs related to self-management of hypertension . Over the next 60 days, the patient will demonstrate ongoing self health care management ability as evidenced by taking medication as directed, checking blood pressure 3 times a week, and by calling PCP if she has any readings outside of the recommended range*  Interventions:  . 1:1 collaboration with Janora Norlander, DO regarding development and update of comprehensive plan of care as evidenced by provider attestation and co-signature . Inter-disciplinary care team collaboration (see longitudinal plan of care) . Evaluation of current treatment plan related to hypertension and patient's adherence to plan as established by provider. . Chart reviewed including relevant office notes and lab results . Discussed home blood pressure readings o Typically checking once a week . Encouraged patient to check and record 3 times a week and to call PCP with any readings outside of recommended range . Reviewed medications . Reviewed upcoming appointments . Provided with RN Care Manager contact number and encouraged to reach out as needed  Patient Goals/Self-Care Activities Over the next 60 days, patient will: . Check and record blood pressure 3 times a week . Bring record to PCP appointment . Call PCP if readings are outside of recommended range . Take medication as directed . Follow a low salt diet      Follow Up Plan:  Telephone follow up appointment with care management team  member scheduled for: 01/11/21 with RN Care Manager The patient has been provided with contact information for the care management team and has been advised to call with any health related questions or concerns.  Next PCP appointment scheduled for: 01/23/21 with Dr Lajuana Ripple  Chong Sicilian, BSN, RN-BC [place] [facility] Direct Dial: [phone]

## 2020-12-09 NOTE — Patient Instructions (Signed)
Visit Information  PATIENT GOALS: Goals Addressed            This Visit's Progress   . Monitor and Manage My Blood Sugar-Diabetes Type 2       Timeframe:  Long-Range Goal Priority:  Medium Start Date:  12/05/20                           Expected End Date:    06/04/21                   Follow Up Date 01/11/21    . Check and record blood pressure 3 times a week . Bring record to PCP appointment . Call PCP if readings are outside of recommended range . Take medication as directed . Follow a low salt diet   Why is this important?    Checking your blood sugar at home helps to keep it from getting very high or very low.   Writing the results in a diary or log helps the doctor know how to care for you.   Your blood sugar log should have the time, date and the results.   Also, write down the amount of insulin or other medicine that you take.   Other information, like what you ate, exercise done and how you were feeling, will also be helpful.     Notes:     . Track and Manage My Blood Pressure-Hypertension       Timeframe:  Long-Range Goal Priority:  Medium Start Date:  12/05/20                           Expected End Date: 06/04/21                      Follow Up Date 01/11/21    Patient will check blood sugar as recommended . Patient will call PCP with any readings outside of recommended range . Patient will contact PCP with any new or worsening side effects from Ozempic . Patient will talk with PCP over the next 30 days regarding side effects . Patient will watch carbohydrate intake and consider Plate Method for meals   Why is this important?    You won't feel high blood pressure, but it can still hurt your blood vessels.   High blood pressure can cause heart or kidney problems. It can also cause a stroke.   Making lifestyle changes like losing a little weight or eating less salt will help.   Checking your blood pressure at home and at different times of the day can help to  control blood pressure.   If the doctor prescribes medicine remember to take it the way the doctor ordered.   Call the office if you cannot afford the medicine or if there are questions about it.     Notes:        Patient verbalizes understanding of instructions provided today and agrees to view in [place].    Follow Up Plan:  Telephone follow up appointment with care management team member scheduled for: 01/11/21 with RN Care Manager The patient has been provided with contact information for the care management team and has been advised to call with any health related questions or concerns.  Next PCP appointment scheduled for: 01/23/21 with Dr Lajuana Ripple  Chong Sicilian, BSN, RN-BC [place] [facility] [place] [facility]  Direct Dial: [phone]

## 2021-01-11 ENCOUNTER — Ambulatory Visit (INDEPENDENT_AMBULATORY_CARE_PROVIDER_SITE_OTHER): Payer: Medicare Other | Admitting: *Deleted

## 2021-01-11 DIAGNOSIS — E1169 Type 2 diabetes mellitus with other specified complication: Secondary | ICD-10-CM

## 2021-01-11 DIAGNOSIS — Z794 Long term (current) use of insulin: Secondary | ICD-10-CM | POA: Diagnosis not present

## 2021-01-11 DIAGNOSIS — E1159 Type 2 diabetes mellitus with other circulatory complications: Secondary | ICD-10-CM | POA: Diagnosis not present

## 2021-01-11 DIAGNOSIS — I152 Hypertension secondary to endocrine disorders: Secondary | ICD-10-CM | POA: Diagnosis not present

## 2021-01-11 NOTE — Chronic Care Management (AMB) (Signed)
Chronic Care Management   CCM RN Visit Note  01/11/2021 Name: Diana Pratt MRN: [id number] DOB: Dec 07, 1945  Subjective: Diana Pratt is a 75 y.o. year old female who is a primary care patient of Janora Norlander, DO. The care management team was consulted for assistance with disease management and care coordination needs.    Engaged with patient by telephone for follow up visit in response to provider referral for case management and/or care coordination services.   Consent to Services:  The patient was given information about Chronic Care Management services, agreed to services, and gave verbal consent prior to initiation of services.  Please see initial visit note for detailed documentation.   Patient agreed to services and verbal consent obtained.   Assessment: Review of patient past medical history, allergies, medications, health status, including review of consultants reports, laboratory and other test data, was performed as part of comprehensive evaluation and provision of chronic care management services.   SDOH (Social Determinants of Health) assessments and interventions performed:    CCM Care Plan  Allergies  Allergen Reactions  . Crestor [Rosuvastatin]     Myalgias   . Lipitor [Atorvastatin] Other (See Comments)    Myalgias   . Livalo [Pitavastatin] Other (See Comments)    myalgias  . Morphine And Related Other (See Comments)    Burning, itching  . Penicillins Hives and Itching  . Simvastatin Other (See Comments)    myalgias  . Sulfa Antibiotics Hives and Itching  . Zetia [Ezetimibe] Cough    Outpatient Encounter Medications as of 01/11/2021  Medication Sig  . ACCU-CHEK AVIVA PLUS test strip Use to check BG up to tid.  Dx: type 2 diabetes requiring insulin therapy E11.40  . Accu-Chek Softclix Lancets lancets Check blood sugar 3 times daily Dx E11.40  . albuterol (PROAIR HFA) 108 (90 Base) MCG/ACT inhaler Inhale 2 puffs into the lungs every 6 (six) hours as  needed for shortness of breath.  Marland Kitchen amLODipine (NORVASC) 10 MG tablet Take 1 tablet (10 mg total) by mouth daily.  Marland Kitchen aspirin 81 MG EC tablet Take 81 mg by mouth daily.  . Blood Glucose Monitoring Suppl (ACCU-CHEK AVIVA PLUS) w/Device KIT Check blood glucose TID Dx E11.40  . budesonide-formoterol (SYMBICORT) 160-4.5 MCG/ACT inhaler Inhale 2 puffs into the lungs 2 (two) times daily.  . calcium carbonate 200 MG capsule Take 250 mg by mouth daily.  . Cholecalciferol (VITAMIN D3) 2000 UNITS capsule Take 2,000 Units by mouth daily.   Marland Kitchen conjugated estrogens (PREMARIN) vaginal cream Apply 0.5gm vaginally 3 days per week.  Marland Kitchen FLUoxetine (PROZAC) 20 MG capsule Take 1 capsule (20 mg total) by mouth daily.  . fluticasone (FLONASE) 50 MCG/ACT nasal spray Place 2 sprays into the nose daily as needed for allergies. Congestion   . hydrochlorothiazide (HYDRODIURIL) 25 MG tablet Take 1 tablet (25 mg total) by mouth daily.  . hydroxypropyl methylcellulose (ISOPTO TEARS) 2.5 % ophthalmic solution Place 1 drop into both eyes daily as needed. for dry eyes  . ibuprofen (ADVIL,MOTRIN) 200 MG tablet Take 800 mg by mouth as needed.  . insulin glargine (LANTUS SOLOSTAR) 100 UNIT/ML Solostar Pen Inject 45 Units into the skin 2 (two) times daily.  . Insulin Pen Needle (PEN NEEDLES) 31G X 6 MM MISC Use to administer insulin once qid. Dx E11.59 Use Leader brand  . LORazepam (ATIVAN) 1 MG tablet Take 1/2-1 tablet daily if needed for anxiety.  May repeat dose once if needed during the day.  Marland Kitchen  metoprolol succinate (TOPROL-XL) 100 MG 24 hr tablet Take 1 tablet (100 mg total) by mouth daily. Take with or immediately following a meal.  . Multiple Vitamins-Minerals (CENTRUM SILVER PO) Take 1 tablet by mouth daily.  . Semaglutide, 1 MG/DOSE, (OZEMPIC, 1 MG/DOSE,) 2 MG/1.5ML SOPN Inject 1 mg into the skin once a week.  Marland Kitchen tiZANidine (ZANAFLEX) 4 MG tablet Take 0.5-1 tablets (2-4 mg total) by mouth every 8 (eight) hours as needed for  muscle spasms.  . valsartan (DIOVAN) 320 MG tablet Take 1 tablet (320 mg total) by mouth daily.   No facility-administered encounter medications on file as of 01/11/2021.    Patient Active Problem List   Diagnosis Date Noted  . 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 Anxiety 07/26/2017  . Vaginal dryness 04/10/2017  . Vaginal atrophy 04/10/2017  . Urinary incontinence 04/10/2017  . 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 Abdominal pain, acute, bilateral lower quadrant 07/01/2014  . Transaminitis 07/01/2014  . COPD (chronic obstructive pulmonary disease) ([place])   . Asthma   . Interstitial cystitis   . Unspecified vitamin D deficiency 05/29/2013  . Lumbar radiculopathy 05/29/2013  . Back strain 05/29/2013  . Unspecified asthma(493.90) 02/27/2013  . Depression with anxiety 02/22/2011  . Diabetes mellitus ([place]) 02/22/2011  . Hyperlipidemia associated with type 2 diabetes mellitus ([place]) 02/22/2011  . Hypertension associated with diabetes ([place]) 02/22/2011    Conditions to be addressed/monitored:HTN and DMII  Care Plan : RNCM: Diabetes Type 2 (Adult)  Updates made by Ilean China, RN since 01/11/2021 12:00 AM    Problem: Glycemic Management (Diabetes, Type 2)   Priority: Medium    Long-Range Goal: Glycemic Management Optimized   This Visit's Progress: On track  Recent Progress: On track  Priority: Medium  Note:   Objective: Lab Results  Component Value Date   HGBA1C 7.3 (H) 10/25/2020   HGBA1C 7.7 (H) 07/26/2020   HGBA1C 7.5 (H) 04/18/2020   Lab Results  Component Value Date   MICROALBUR NEG 07/21/2014   LDLCALC 159 (H) 06/25/2019   CREATININE 0.75 10/25/2020   Current Barriers:  . Chronic Disease Management support and education needs related to diabetes in a patient with hypertension. [name] is making her nauseated  Nurse Case Manager Clinical Goal(s):  Marland Kitchen Patient will work with RN Care Manager to address needs related to  self-management of diabetes . Patient will talk with PCP regarding side effects of Ozempic  Interventions:  . 1:1 collaboration with Janora Norlander, DO regarding development and update of comprehensive plan of care as evidenced by provider attestation and co-signature . Inter-disciplinary care team collaboration (see longitudinal plan of care) . Evaluation of current treatment plan related to diabetes and patient's adherence to plan as established by provider. . Chart reviewed including relevant office notes and lab results . Reviewed and discussed medications o Continues to have nausea the day after Ozempic injection but is has gotten a little better o Gets medications through [facility] . Collaborated with PCP regarding Ozempic causing nausea. She recommended an office visit to discuss treatment options. o Clinical staff reached out to patient to schedule appointment. Patient declined at that time but has a f/u on 01/23/21 with PCP and will discuss at that time.  .  Discussed home blood sugar testing o Testing daily and PRN o No readings below 70  o Has had a few readings in the 70s over the past few weeks. Felt shaky and knew it  was low.  o Averaging 180-200 after meals . Discussed diet o Reinforced plate method with 1/2 plate of non-starchy vegetables, 1/4 protein, 1/4 carbohydrates o Discussed carbohydrate counting as well o Will print resources and have front office staff put them in folder for pickup at next visit on 01/23/21 . Reviewed upcoming appointments . Encouraged patient to contact PCP with any blood sugar readings outside of recommended range . Encouraged patient to reach out to [place] as needed  Patient Goals/Self-Care Activities Over the next 60 days, patient will: . check blood sugar as recommended . call PCP with any readings outside of recommended range . contact PCP with any new or worsening side effects from Ozempic . Have office visit with PCP on  01/23/21 . Talk with PCP about ozempic side effects . Pickup Scientist, clinical (histocompatibility and immunogenetics) at [employer] . Review handout on plate method and carb counting and modify diet if necessary . Call [facility] as needed [phone]    Care Plan : RNCM: Hypertension (Adult)  Updates made by Ilean China, RN since 01/11/2021 12:00 AM    Problem: Hypertension (Hypertension)   Priority: Medium    Long-Range Goal: Hypertension Monitored   Start Date: 12/05/2020  This Visit's Progress: On track  Recent Progress: Not on track  Priority: Medium  Note:   Current Barriers:  . Chronic Disease Management support and education needs related to hypertension in a patient with diabetes  Nurse Case Manager Clinical Goal(s):  Marland Kitchen Patient will work with RN Care Manager to address needs related to self-management of hypertension . Patient will demonstrate ongoing self health care management ability as evidenced by taking medication as directed, checking blood pressure 3 times a week, and by calling PCP if she has any readings outside of the recommended range*  Interventions:  . 1:1 collaboration with Janora Norlander, DO regarding development and update of comprehensive plan of care as evidenced by provider attestation and co-signature . Inter-disciplinary care team collaboration (see longitudinal plan of care) . Evaluation of current treatment plan related to hypertension and patient's adherence to plan as established by provider. . Chart reviewed including relevant office notes and lab results . Discussed home blood pressure readings o Did not have readings close by but have been running "pretty good" . Encouraged patient to continue checking and record 3 times a week and to call PCP with any readings outside of recommended range . Reviewed medications . Reviewed upcoming appointments . Provided with RN Care Manager contact number and encouraged to reach out as needed  Patient Goals/Self-Care Activities Over the  next 60 days, patient will: . Check and record blood pressure 3 times a week . Bring record to PCP appointment . Call PCP if readings are outside of recommended range . Take medication as directed . Follow a low salt diet. Can be combined with plate method diet for diabetes management.  . Call RNCM as needed [phone]    Follow Up Plan:  . Telephone follow up appointment with care management team member scheduled for: 02/22/21 with RN Care Manager . The patient has been provided with contact information for the care management team and has been advised to call with any health related questions or concerns.  . Next PCP appointment scheduled for: 01/23/21 with Dr Lajuana Ripple  Chong Sicilian, BSN, RN-BC [place] [facility] [place] [facility] Direct Dial: [phone]

## 2021-01-11 NOTE — Patient Instructions (Signed)
Visit Information  PATIENT GOALS: Goals Addressed            This Visit's Progress   . Monitor and Manage My Blood Sugar-Diabetes Type 2   On track    Timeframe:  Long-Range Goal Priority:  Medium Start Date:  12/05/20                           Expected End Date:    06/04/21                   Follow Up Date 02/22/21   . check blood sugar as recommended . call PCP with any readings outside of recommended range . contact PCP with any new or worsening side effects from Ozempic . Have office visit with PCP on 01/23/21 . Talk with PCP about ozempic side effects . Pickup Scientist, clinical (histocompatibility and immunogenetics) at [employer] . Review handout on plate method and carb counting and modify diet if necessary . Call [facility] as needed [phone]   Why is this important?    Checking your blood sugar at home helps to keep it from getting very high or very low.   Writing the results in a diary or log helps the doctor know how to care for you.   Your blood sugar log should have the time, date and the results.   Also, write down the amount of insulin or other medicine that you take.   Other information, like what you ate, exercise done and how you were feeling, will also be helpful.     Notes:     . Track and Manage My Blood Pressure-Hypertension   On track    Timeframe:  Long-Range Goal Priority:  Medium Start Date:  12/05/20                           Expected End Date: 06/04/21                      Follow Up Date 4/20.22   . Check and record blood pressure 3 times a week . Bring record to PCP appointment . Call PCP if readings are outside of recommended range . Take medication as directed . Follow a low salt diet. Can be combined with plate method diet for diabetes management.  . Call [facility] as needed [phone]   Why is this important?    You won't feel high blood pressure, but it can still hurt your blood vessels.   High blood pressure can cause heart or kidney problems. It can also cause a  stroke.   Making lifestyle changes like losing a little weight or eating less salt will help.   Checking your blood pressure at home and at different times of the day can help to control blood pressure.   If the doctor prescribes medicine remember to take it the way the doctor ordered.   Call the office if you cannot afford the medicine or if there are questions about it.     Notes:        Patient verbalizes understanding of instructions provided today and agrees to view in [place].   Follow Up Plan:  . Telephone follow up appointment with care management team member scheduled for: 02/22/21 with RN Care Manager . The patient has been provided with contact information for the care management team and has been advised to call with any health related  questions or concerns.  . Next PCP appointment scheduled for: 01/23/21 with Dr Lajuana Ripple  Chong Sicilian, BSN, RN-BC [place] [facility] [place] [facility] Direct Dial: [phone]

## 2021-01-23 ENCOUNTER — Ambulatory Visit (INDEPENDENT_AMBULATORY_CARE_PROVIDER_SITE_OTHER): Payer: Medicare Other | Admitting: Family Medicine

## 2021-01-23 ENCOUNTER — Encounter: Payer: Self-pay | Admitting: Family Medicine

## 2021-01-23 ENCOUNTER — Other Ambulatory Visit: Payer: Self-pay

## 2021-01-23 VITALS — BP 137/56 | HR 70 | Temp 98.1°F | Resp 20 | Ht 64.0 in | Wt 212.0 lb

## 2021-01-23 DIAGNOSIS — R110 Nausea: Secondary | ICD-10-CM

## 2021-01-23 DIAGNOSIS — F418 Other specified anxiety disorders: Secondary | ICD-10-CM | POA: Diagnosis not present

## 2021-01-23 DIAGNOSIS — E1159 Type 2 diabetes mellitus with other circulatory complications: Secondary | ICD-10-CM | POA: Diagnosis not present

## 2021-01-23 DIAGNOSIS — T466X5A Adverse effect of antihyperlipidemic and antiarteriosclerotic drugs, initial encounter: Secondary | ICD-10-CM

## 2021-01-23 DIAGNOSIS — G720 Drug-induced myopathy: Secondary | ICD-10-CM

## 2021-01-23 DIAGNOSIS — E785 Hyperlipidemia, unspecified: Secondary | ICD-10-CM

## 2021-01-23 DIAGNOSIS — E1169 Type 2 diabetes mellitus with other specified complication: Secondary | ICD-10-CM | POA: Diagnosis not present

## 2021-01-23 DIAGNOSIS — I152 Hypertension secondary to endocrine disorders: Secondary | ICD-10-CM

## 2021-01-23 DIAGNOSIS — Z79899 Other long term (current) drug therapy: Secondary | ICD-10-CM

## 2021-01-23 DIAGNOSIS — K219 Gastro-esophageal reflux disease without esophagitis: Secondary | ICD-10-CM

## 2021-01-23 DIAGNOSIS — Z794 Long term (current) use of insulin: Secondary | ICD-10-CM

## 2021-01-23 LAB — BAYER DCA HB A1C WAIVED: HB A1C (BAYER DCA - WAIVED): 7 % — ABNORMAL HIGH (ref <7.0)

## 2021-01-23 MED ORDER — FLUOXETINE HCL 20 MG PO CAPS: 20.0000 mg | ORAL_CAPSULE | Freq: Every day | ORAL | 2 refills | Status: DC

## 2021-01-23 MED ORDER — LORAZEPAM 1 MG PO TABS: ORAL_TABLET | ORAL | 2 refills | Status: DC

## 2021-01-23 MED ORDER — OMEPRAZOLE 20 MG PO CPDR: 20.0000 mg | DELAYED_RELEASE_CAPSULE | Freq: Every day | ORAL | 0 refills | Status: DC

## 2021-01-23 NOTE — Progress Notes (Signed)
Subjective: CC: DM PCP: Janora Norlander, DO DGL:OVFIEP R Brackeen is a 75 y.o. female presenting to clinic today for:  1. 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  She is compliant with her medications and notes that she is been having some nausea over the last couple of months.  Not sure if this is due to the [place].  She does admit to acid reflux symptoms.  Denies any vomiting.  Tolerating p.o. intake without difficulty.  No abdominal pain.  Last eye exam: Needs Last foot exam: UTD Last A1c:  Lab Results  Component Value Date   HGBA1C 7.3 (H) 10/25/2020   Nephropathy screen indicated?: UTD Last flu, zoster and/or pneumovax:  Immunization History  Administered Date(s) Administered  . DTaP 01/04/2003  . Pneumococcal Conjugate-13 09/21/2019  . Pneumococcal Polysaccharide-23 06/30/2018     2. GAD w/ panic She feels stable but admits that she has not been taking the Prozac for several months.  Initially she thought that this was causing sleep disturbances where she would wake up every 2 hours during the night.  However, she has had ongoing symptoms despite discontinuation of the Prozac.  She uses her Ativan on a daily basis.  Denies any excessive daytime sedation, falls, respiratory depression, visual or auditory hallucinations.  ROS: Per HPI  Allergies  Allergen Reactions  . Crestor [Rosuvastatin]     Myalgias   . Lipitor [Atorvastatin] Other (See Comments)    Myalgias   . Livalo [Pitavastatin] Other (See Comments)    myalgias  . Morphine And Related Other (See Comments)    Burning, itching  . Penicillins Hives and Itching  . Simvastatin Other (See Comments)    myalgias  . Sulfa Antibiotics Hives and Itching  . Zetia [Ezetimibe] Cough   Past Medical History:  Diagnosis Date  . Anxiety   . Asthma   . Back pain   . Cancer ([place]) 2017   skin cancer on left ear, SCAB W/ SOME  DRAINAGE  . COPD (chronic obstructive pulmonary disease) ([place])    dr. Kenn File  . Depression   . Diabetes mellitus   . Diabetic neuropathy ([place])   . GERD (gastroesophageal reflux disease)    occ heartburn  . Humerus fracture    right  . Hyperlipemia   . Hypertension   . Interstitial cystitis   . Osteoporosis   . Pneumonia    15 yrs ago  . PONV (postoperative nausea and vomiting)   . Shortness of breath dyspnea     Current Outpatient Medications:  .  ACCU-CHEK AVIVA PLUS test strip, Use to check BG up to tid.  Dx: type 2 diabetes requiring insulin therapy E11.40, Disp: 300 each, Rfl: 3 .  Accu-Chek Softclix Lancets lancets, Check blood sugar 3 times daily Dx E11.40, Disp: 300 each, Rfl: 3 .  albuterol (PROAIR HFA) 108 (90 Base) MCG/ACT inhaler, Inhale 2 puffs into the lungs every 6 (six) hours as needed for shortness of breath., Disp: 54 g, Rfl: 2 .  amLODipine (NORVASC) 10 MG tablet, Take 1 tablet (10 mg total) by mouth daily., Disp: 90 tablet, Rfl: 2 .  aspirin 81 MG EC tablet, Take 81 mg by mouth daily., Disp: , Rfl:  .  Blood Glucose Monitoring Suppl (ACCU-CHEK AVIVA PLUS) w/Device KIT, Check blood glucose TID Dx E11.40, Disp: 1 kit, Rfl: 0 .  budesonide-formoterol (SYMBICORT) 160-4.5 MCG/ACT inhaler, Inhale 2 puffs into  the lungs 2 (two) times daily., Disp: 3 Inhaler, Rfl: 1 .  calcium carbonate 200 MG capsule, Take 250 mg by mouth daily., Disp: , Rfl:  .  Cholecalciferol (VITAMIN D3) 2000 UNITS capsule, Take 2,000 Units by mouth daily. , Disp: 60 capsule, Rfl: 11 .  conjugated estrogens (PREMARIN) vaginal cream, Apply 0.5gm vaginally 3 days per week., Disp: 90 g, Rfl: 2 .  FLUoxetine (PROZAC) 20 MG capsule, Take 1 capsule (20 mg total) by mouth daily., Disp: 90 capsule, Rfl: 2 .  fluticasone (FLONASE) 50 MCG/ACT nasal spray, Place 2 sprays into the nose daily as needed for allergies. Congestion , Disp: , Rfl:  .  hydrochlorothiazide (HYDRODIURIL) 25 MG tablet, Take 1  tablet (25 mg total) by mouth daily., Disp: 90 tablet, Rfl: 2 .  hydroxypropyl methylcellulose (ISOPTO TEARS) 2.5 % ophthalmic solution, Place 1 drop into both eyes daily as needed. for dry eyes, Disp: , Rfl:  .  ibuprofen (ADVIL,MOTRIN) 200 MG tablet, Take 800 mg by mouth as needed., Disp: , Rfl:  .  insulin glargine (LANTUS SOLOSTAR) 100 UNIT/ML Solostar Pen, Inject 45 Units into the skin 2 (two) times daily., Disp: 90 mL, Rfl: 2 .  Insulin Pen Needle (PEN NEEDLES) 31G X 6 MM MISC, Use to administer insulin once qid. Dx E11.59 Use Leader brand, Disp: 100 each, Rfl: 3 .  LORazepam (ATIVAN) 1 MG tablet, Take 1/2-1 tablet daily if needed for anxiety.  May repeat dose once if needed during the day., Disp: 45 tablet, Rfl: 2 .  metoprolol succinate (TOPROL-XL) 100 MG 24 hr tablet, Take 1 tablet (100 mg total) by mouth daily. Take with or immediately following a meal., Disp: 90 tablet, Rfl: 2 .  Multiple Vitamins-Minerals (CENTRUM SILVER PO), Take 1 tablet by mouth daily., Disp: , Rfl:  .  Semaglutide, 1 MG/DOSE, (OZEMPIC, 1 MG/DOSE,) 2 MG/1.5ML SOPN, Inject 1 mg into the skin once a week., Disp: 9 mL, Rfl: 2 .  tiZANidine (ZANAFLEX) 4 MG tablet, Take 0.5-1 tablets (2-4 mg total) by mouth every 8 (eight) hours as needed for muscle spasms., Disp: 30 tablet, Rfl: 0 .  valsartan (DIOVAN) 320 MG tablet, Take 1 tablet (320 mg total) by mouth daily., Disp: 90 tablet, Rfl: 2 Social History   Socioeconomic History  . Marital status: Married    Spouse name: [name]  . Number of children: 1  . Years of education: 29  . Highest education level: Some college, no degree  Occupational History  . Occupation: retired     Comment: CNA   Tobacco Use  . Smoking status: Former Smoker    Packs/day: 0.50    Years: 34.00    Pack years: 17.00    Types: Cigarettes    Quit date: 06/01/1994    Years since quitting: 26.6  . Smokeless tobacco: Never Used  Vaping Use  . Vaping Use: Never used  Substance and Sexual Activity   . Alcohol use: No  . Drug use: No  . Sexual activity: Yes    Birth control/protection: Surgical    Comment: hyst  Other Topics Concern  . Not on file  Social History Narrative   RETIRED: WORKS AS A CNA AT [employer]   1 DAUGHTER-IN [place]   O GRANDKIDS   DRINKS SOCIALLY   Social Determinants of Health   Financial Resource Strain: Low Risk   . Difficulty of Paying Living Expenses: Not very hard  Food Insecurity: No Food Insecurity  . Worried About [employer] of  Food in the Last Year: Never true  . Ran Out of Food in the Last Year: Never true  Transportation Needs: No Transportation Needs  . Lack of Transportation (Medical): No  . Lack of Transportation (Non-Medical): No  Physical Activity: Insufficiently Active  . Days of Exercise per Week: 3 days  . Minutes of Exercise per Session: 30 min  Stress: No Stress Concern Present  . Feeling of Stress : Not at all  Social Connections: Socially Integrated  . Frequency of Communication with Friends and Family: More than three times a week  . Frequency of Social Gatherings with Friends and Family: More than three times a week  . Attends Religious Services: More than 4 times per year  . Active Member of Clubs or Organizations: Yes  . Attends Archivist Meetings: More than 4 times per year  . Marital Status: Married  Human resources officer Violence: Not At Risk  . Fear of Current or Ex-Partner: No  . Emotionally Abused: No  . Physically Abused: No  . Sexually Abused: No   Family History  Problem Relation Age of Onset  . Parkinsonism Mother   . Dementia Mother   . Colon cancer Father 51       MULTIPLE CO-MORBIDITIES  . Cancer Father   . Diabetes Paternal Aunt   . Diabetes Paternal Uncle   . Diabetes Paternal Grandmother   . Congestive Heart Failure Paternal Grandfather   . Congestive Heart Failure Maternal Grandmother   . Stroke Maternal Grandfather   . Colon polyps Neg Hx     Objective: Office vital  signs reviewed. BP (!) 137/56   Pulse 70   Temp 98.1 F (36.7 C)   Resp 20   Ht '5\' 4"'  (1.626 m)   Wt 212 lb (96.2 kg)   SpO2 97%   BMI 36.39 kg/m   Physical Examination:  General: Awake, alert, well-appearing, No acute distress Cardio: regular rate and rhythm, S1S2 heard, soft systolic murmur appreciated Pulm: clear to auscultation bilaterally, no wheezes, rhonchi or rales; normal work of breathing on room air GI: Nondistended, obese Extremities: warm, well perfused, No edema, cyanosis or clubbing; +2 pulses bilaterally Psych: Mood is stable.  Speech is normal.  Affect somewhat flat but patient is pleasant and interactive  Assessment/ Plan: 75 y.o. female   Type 2 diabetes mellitus with other specified complication, with long-term current use of insulin ([place]) - Plan: Bayer DCA Hb A1c Waived  Hyperlipidemia associated with type 2 diabetes mellitus (HCC)  Statin myopathy  Hypertension associated with diabetes ([place])  Depression with anxiety - Plan: FLUoxetine (PROZAC) 20 MG capsule, LORazepam (ATIVAN) 1 MG tablet  Chronic use of benzodiazepine for therapeutic purpose  Nausea - Plan: omeprazole (PRILOSEC) 20 MG capsule  Gastroesophageal reflux disease without esophagitis - Plan: omeprazole (PRILOSEC) 20 MG capsule  Sugar is now controlled with A1c at 7.0.  Not treated with statin due to history of statin induced myopathy  Blood pressures well controlled  Start Prozac again her PHQ is high  Benzodiazepine renewed and placed on hold.  National narcotic database was reviewed and there were no red flags  Nausea may be secondary to use of GLP but she is also reporting GERD symptoms so we will try her on a PPI for the next month.  If symptoms resolve, we will continue the PPI.  If symptoms do not resolve, will consider discontinuing Ozempic and trialing a different medication.  The Narcotic Database has been reviewed.  There were no  red flags.  Last fill 01/19/21  No orders of  the defined types were placed in this encounter.  No orders of the defined types were placed in this encounter.    Janora Norlander, DO [place] [phone]

## 2021-01-23 NOTE — Patient Instructions (Addendum)
Schedule your eye exam.  Start back on Prozac (fluoxetine) Omeprazole for stomach (hopefully, will help nausea)  Sugar is perfect today.

## 2021-02-22 ENCOUNTER — Ambulatory Visit (INDEPENDENT_AMBULATORY_CARE_PROVIDER_SITE_OTHER): Payer: Medicare Other | Admitting: *Deleted

## 2021-02-22 DIAGNOSIS — I152 Hypertension secondary to endocrine disorders: Secondary | ICD-10-CM | POA: Diagnosis not present

## 2021-02-22 DIAGNOSIS — E1159 Type 2 diabetes mellitus with other circulatory complications: Secondary | ICD-10-CM

## 2021-02-22 DIAGNOSIS — Z794 Long term (current) use of insulin: Secondary | ICD-10-CM | POA: Diagnosis not present

## 2021-02-22 DIAGNOSIS — E1169 Type 2 diabetes mellitus with other specified complication: Secondary | ICD-10-CM

## 2021-03-03 NOTE — Patient Instructions (Signed)
Visit Information  PATIENT GOALS: Goals Addressed            This Visit's Progress   . Monitor and Manage My Blood Sugar-Diabetes Type 2   On track    Timeframe:  Long-Range Goal Priority:  Medium Start Date:  12/05/20                           Expected End Date:    06/04/21                   Follow Up Date 03/29/21   . check blood sugar as recommended . call PCP with any readings outside of recommended range . contact PCP with any new or worsening side effects from Ozempic . Keep all medical appointments . Reference handouts on Plate Method and carb counting and modify diet if necessary . Call [facility] as needed [phone]   Why is this important?    Checking your blood sugar at home helps to keep it from getting very high or very low.   Writing the results in a diary or log helps the doctor know how to care for you.   Your blood sugar log should have the time, date and the results.   Also, write down the amount of insulin or other medicine that you take.   Other information, like what you ate, exercise done and how you were feeling, will also be helpful.     Notes:     . Track and Manage My Blood Pressure-Hypertension   On track    Timeframe:  Long-Range Goal Priority:  Medium Start Date:  12/05/20                           Expected End Date: 06/04/21                      Follow Up Date 03/29/21   . Check and record blood pressure at least 3 times a week . Bring record to PCP appointment . Call PCP if readings are outside of recommended range . Take medication as directed . Follow a low salt diet. Can be combined with plate method diet for diabetes management.  . Increase activity level as tolerated . Call [facility] as needed [phone]   Why is this important?    You won't feel high blood pressure, but it can still hurt your blood vessels.   High blood pressure can cause heart or kidney problems. It can also cause a stroke.   Making lifestyle changes like losing a  little weight or eating less salt will help.   Checking your blood pressure at home and at different times of the day can help to control blood pressure.   If the doctor prescribes medicine remember to take it the way the doctor ordered.   Call the office if you cannot afford the medicine or if there are questions about it.     Notes:        Patient verbalizes understanding of instructions provided today and agrees to view in [place].   Follow Up Plan:  . Telephone follow up appointment with care management team member scheduled for: 03/29/21 with RN Care Manager . The patient has been provided with contact information for the care management team and has been advised to call with any health related questions or concerns.  . Next PCP appointment scheduled for:  05/25/21 with Dr Lajuana Ripple  Chong Sicilian, BSN, RN-BC [place] [facility] [place] [facility] Direct Dial: [phone]

## 2021-03-03 NOTE — Chronic Care Management (AMB) (Signed)
Chronic Care Management   CCM RN Visit Note  02/22/2021 Name: Diana Pratt MRN: [id number] DOB: 1946/10/18  Subjective: Diana Pratt is a 75 y.o. year old female who is a primary care patient of Janora Norlander, DO. The care management team was consulted for assistance with disease management and care coordination needs.    Engaged with patient by telephone for follow up visit in response to provider referral for case management and/or care coordination services.   Consent to Services:  The patient was given information about Chronic Care Management services, agreed to services, and gave verbal consent prior to initiation of services.  Please see initial visit note for detailed documentation.   Patient agreed to services and verbal consent obtained.   Assessment: Review of patient past medical history, allergies, medications, health status, including review of consultants reports, laboratory and other test data, was performed as part of comprehensive evaluation and provision of chronic care management services.   SDOH (Social Determinants of Health) assessments and interventions performed:    CCM Care Plan  Allergies  Allergen Reactions  . Crestor [Rosuvastatin]     Myalgias   . Lipitor [Atorvastatin] Other (See Comments)    Myalgias   . Livalo [Pitavastatin] Other (See Comments)    myalgias  . Morphine And Related Other (See Comments)    Burning, itching  . Penicillins Hives and Itching  . Simvastatin Other (See Comments)    myalgias  . Sulfa Antibiotics Hives and Itching  . Zetia [Ezetimibe] Cough    Outpatient Encounter Medications as of 02/22/2021  Medication Sig  . ACCU-CHEK AVIVA PLUS test strip Use to check BG up to tid.  Dx: type 2 diabetes requiring insulin therapy E11.40  . Accu-Chek Softclix Lancets lancets Check blood sugar 3 times daily Dx E11.40  . albuterol (PROAIR HFA) 108 (90 Base) MCG/ACT inhaler Inhale 2 puffs into the lungs every 6 (six) hours  as needed for shortness of breath.  Marland Kitchen amLODipine (NORVASC) 10 MG tablet Take 1 tablet (10 mg total) by mouth daily.  Marland Kitchen aspirin 81 MG EC tablet Take 81 mg by mouth daily.  . Blood Glucose Monitoring Suppl (ACCU-CHEK AVIVA PLUS) w/Device KIT Check blood glucose TID Dx E11.40  . budesonide-formoterol (SYMBICORT) 160-4.5 MCG/ACT inhaler Inhale 2 puffs into the lungs 2 (two) times daily.  . calcium carbonate 200 MG capsule Take 250 mg by mouth daily.  . Cholecalciferol (VITAMIN D3) 2000 UNITS capsule Take 2,000 Units by mouth daily.   Marland Kitchen conjugated estrogens (PREMARIN) vaginal cream Apply 0.5gm vaginally 3 days per week.  Marland Kitchen FLUoxetine (PROZAC) 20 MG capsule Take 1 capsule (20 mg total) by mouth daily.  . fluticasone (FLONASE) 50 MCG/ACT nasal spray Place 2 sprays into the nose daily as needed for allergies. Congestion   . hydrochlorothiazide (HYDRODIURIL) 25 MG tablet Take 1 tablet (25 mg total) by mouth daily.  . hydroxypropyl methylcellulose (ISOPTO TEARS) 2.5 % ophthalmic solution Place 1 drop into both eyes daily as needed. for dry eyes  . ibuprofen (ADVIL,MOTRIN) 200 MG tablet Take 800 mg by mouth as needed.  . insulin glargine (LANTUS SOLOSTAR) 100 UNIT/ML Solostar Pen Inject 45 Units into the skin 2 (two) times daily.  . Insulin Pen Needle (PEN NEEDLES) 31G X 6 MM MISC Use to administer insulin once qid. Dx E11.59 Use Leader brand  . LORazepam (ATIVAN) 1 MG tablet Take 1/2-1 tablet daily if needed for anxiety.  May repeat dose once if needed during the day. PUT  ON FILE  . metoprolol succinate (TOPROL-XL) 100 MG 24 hr tablet Take 1 tablet (100 mg total) by mouth daily. Take with or immediately following a meal.  . Multiple Vitamins-Minerals (CENTRUM SILVER PO) Take 1 tablet by mouth daily.  Marland Kitchen omeprazole (PRILOSEC) 20 MG capsule Take 1 capsule (20 mg total) by mouth daily.  . Semaglutide, 1 MG/DOSE, (OZEMPIC, 1 MG/DOSE,) 2 MG/1.5ML SOPN Inject 1 mg into the skin once a week.  Marland Kitchen tiZANidine  (ZANAFLEX) 4 MG tablet Take 0.5-1 tablets (2-4 mg total) by mouth every 8 (eight) hours as needed for muscle spasms.  . valsartan (DIOVAN) 320 MG tablet Take 1 tablet (320 mg total) by mouth daily.   No facility-administered encounter medications on file as of 02/22/2021.    Patient Active Problem List   Diagnosis Date Noted  . 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 Anxiety 07/26/2017  . Vaginal dryness 04/10/2017  . Vaginal atrophy 04/10/2017  . Urinary incontinence 04/10/2017  . 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 Abdominal pain, acute, bilateral lower quadrant 07/01/2014  . Transaminitis 07/01/2014  . COPD (chronic obstructive pulmonary disease) ([place])   . Asthma   . Interstitial cystitis   . Unspecified vitamin D deficiency 05/29/2013  . Lumbar radiculopathy 05/29/2013  . Back strain 05/29/2013  . Unspecified asthma(493.90) 02/27/2013  . Depression with anxiety 02/22/2011  . Diabetes mellitus ([place]) 02/22/2011  . Hyperlipidemia associated with type 2 diabetes mellitus ([place]) 02/22/2011  . Hypertension associated with diabetes ([place]) 02/22/2011    Conditions to be addressed/monitored:HTN and DMII  Care Plan : RNCM: Diabetes Type 2 (Adult)    Problem: Glycemic Management (Diabetes, Type 2)   Priority: Medium    Long-Range Goal: Glycemic Management Optimized   This Visit's Progress: On track  Recent Progress: On track  Priority: Medium  Note:   Objective: Lab Results  Component Value Date   HGBA1C 7.0 (H) 01/23/2021   HGBA1C 7.3 (H) 10/25/2020   HGBA1C 7.7 (H) 07/26/2020   Lab Results  Component Value Date   MICROALBUR NEG 07/21/2014   LDLCALC 159 (H) 06/25/2019   CREATININE 0.75 10/25/2020   Current Barriers:  . Chronic Disease Management support and education needs related to diabetes in a patient with hypertension. [name] is making her nauseated  Nurse Case Manager Clinical Goal(s):  Marland Kitchen Patient will work with RN  Care Manager to address needs related to self-management of diabetes . Patient will work with PCP regarding medical management of diabetes . Patient will demonstrate ongoing self health maintenance by maintaining an A1C of around 7  Interventions:  . 1:1 collaboration with Janora Norlander, DO regarding development and update of comprehensive plan of care as evidenced by provider attestation and co-signature . Inter-disciplinary care team collaboration (see longitudinal plan of care) . Evaluation of current treatment plan related to diabetes and patient's adherence to plan as established by provider. . Chart reviewed including relevant office notes and lab results . Reviewed and discussed medications o Continues to have nausea the day after Ozempic injection but is has gotten a little better o Gets medications through [facility] o Does not want to discontinue .  Discussed home blood sugar testing o Testing daily and PRN o No readings below 70  o Has had a few readings in the 70s over the past few weeks. Felt shaky and knew it was low.  o Averaging 180-200 after meals . Discussed diet o Reinforced plate method with 1/2 plate  of non-starchy vegetables, 1/4 protein, 1/4 carbohydrates o Discussed carbohydrate counting as well . Reviewed upcoming appointments . Encouraged patient to contact PCP with any blood sugar readings outside of recommended range . Encouraged patient to reach out to [facility] as needed  Patient Goals/Self-Care Activities Over the next 60 days, patient will: . check blood sugar as recommended . call PCP with any readings outside of recommended range . contact PCP with any new or worsening side effects from Ozempic . Keep all medical appointments . Reference handouts on Plate Method and carb counting and modify diet if necessary . [facility] as needed [phone]    Care Plan : RNCM: Hypertension (Adult)    Problem: Hypertension (Hypertension)   Priority:  Medium    Long-Range Goal: Hypertension Monitored   Start Date: 12/05/2020  This Visit's Progress: On track  Recent Progress: On track  Priority: Medium  Note:   Objective: BP Readings from Last 3 Encounters:  01/23/21 (!) 137/56  10/25/20 (!) 200/82  10/03/20 (!) 146/80   Current Barriers:  . Chronic Disease Management support and education needs related to hypertension in a patient with diabetes  Nurse Case Manager Clinical Goal(s):  Marland Kitchen Patient will work with RN Care Manager to address needs related to self-management of hypertension . Patient will work with PCP to address needs related to medical management of hypertension . Patient will demonstrate ongoing self health care management ability as evidenced by taking medication as directed, checking blood pressure 3 times a week, and by calling PCP if she has any readings outside of the recommended range*  Interventions:  . 1:1 collaboration with Janora Norlander, DO regarding development and update of comprehensive plan of care as evidenced by provider attestation and co-signature . Inter-disciplinary care team collaboration (see longitudinal plan of care) . Evaluation of current treatment plan related to hypertension and patient's adherence to plan as established by provider. . Chart reviewed including relevant office notes and lab results . Discussed home blood pressure readings . Reinforced need continue checking and recording blood pressure at least 3 times a week and to call PCP with any readings outside of recommended range . Reviewed and discussed medications . Reviewed and discussed upcoming appointments . Encouraged low sodium diet . Provided with RN Care Manager contact number and encouraged to reach out as needed  Patient Goals/Self-Care Activities Over the next 60 days, patient will: . Check and record blood pressure at least 3 times a week . Bring record to PCP appointment . Call PCP if readings are outside of  recommended range . Take medication as directed . Follow a low salt diet. Can be combined with plate method diet for diabetes management.  . Increase activity level as tolerated . Call [facility] as needed [phone]    Follow Up Plan:  . Telephone follow up appointment with care management team member scheduled for: 03/29/21 with RN Care Manager . The patient has been provided with contact information for the care management team and has been advised to call with any health related questions or concerns.  . Next PCP appointment scheduled for: 05/25/21 with Dr Lajuana Ripple  Chong Sicilian, BSN, RN-BC [facility] Direct Dial: [phone]

## 2021-03-29 ENCOUNTER — Encounter: Payer: Self-pay | Admitting: *Deleted

## 2021-03-29 ENCOUNTER — Ambulatory Visit (INDEPENDENT_AMBULATORY_CARE_PROVIDER_SITE_OTHER): Payer: Medicare Other | Admitting: *Deleted

## 2021-03-29 DIAGNOSIS — J449 Chronic obstructive pulmonary disease, unspecified: Secondary | ICD-10-CM

## 2021-03-29 DIAGNOSIS — E1169 Type 2 diabetes mellitus with other specified complication: Secondary | ICD-10-CM

## 2021-03-29 DIAGNOSIS — Z794 Long term (current) use of insulin: Secondary | ICD-10-CM | POA: Diagnosis not present

## 2021-03-29 DIAGNOSIS — E1159 Type 2 diabetes mellitus with other circulatory complications: Secondary | ICD-10-CM

## 2021-03-29 DIAGNOSIS — I152 Hypertension secondary to endocrine disorders: Secondary | ICD-10-CM

## 2021-03-29 NOTE — Chronic Care Management (AMB) (Signed)
Chronic Care Management   CCM RN Visit Note  03/29/2021 Name: Diana Pratt MRN: [id number] DOB: 05/26/1946  Subjective: Diana Pratt is a 75 y.o. year old female who is a primary care patient of Janora Norlander, DO. The care management team was consulted for assistance with disease management and care coordination needs.    Engaged with patient by telephone for follow up visit in response to provider referral for case management and/or care coordination services.   Consent to Services:  The patient was given information about Chronic Care Management services, agreed to services, and gave verbal consent prior to initiation of services.  Please see initial visit note for detailed documentation.   Patient agreed to services and verbal consent obtained.   Assessment: Review of patient past medical history, allergies, medications, health status, including review of consultants reports, laboratory and other test data, was performed as part of comprehensive evaluation and provision of chronic care management services.   SDOH (Social Determinants of Health) assessments and interventions performed:    CCM Care Plan  Allergies  Allergen Reactions  . Crestor [Rosuvastatin]     Myalgias   . Lipitor [Atorvastatin] Other (See Comments)    Myalgias   . Livalo [Pitavastatin] Other (See Comments)    myalgias  . Morphine And Related Other (See Comments)    Burning, itching  . Penicillins Hives and Itching  . Simvastatin Other (See Comments)    myalgias  . Sulfa Antibiotics Hives and Itching  . Zetia [Ezetimibe] Cough    Outpatient Encounter Medications as of 03/29/2021  Medication Sig  . ACCU-CHEK AVIVA PLUS test strip Use to check BG up to tid.  Dx: type 2 diabetes requiring insulin therapy E11.40  . Accu-Chek Softclix Lancets lancets Check blood sugar 3 times daily Dx E11.40  . albuterol (PROAIR HFA) 108 (90 Base) MCG/ACT inhaler Inhale 2 puffs into the lungs every 6 (six) hours  as needed for shortness of breath.  Marland Kitchen amLODipine (NORVASC) 10 MG tablet Take 1 tablet (10 mg total) by mouth daily.  Marland Kitchen aspirin 81 MG EC tablet Take 81 mg by mouth daily.  . Blood Glucose Monitoring Suppl (ACCU-CHEK AVIVA PLUS) w/Device KIT Check blood glucose TID Dx E11.40  . budesonide-formoterol (SYMBICORT) 160-4.5 MCG/ACT inhaler Inhale 2 puffs into the lungs 2 (two) times daily.  . calcium carbonate 200 MG capsule Take 250 mg by mouth daily.  . Cholecalciferol (VITAMIN D3) 2000 UNITS capsule Take 2,000 Units by mouth daily.   Marland Kitchen conjugated estrogens (PREMARIN) vaginal cream Apply 0.5gm vaginally 3 days per week.  Marland Kitchen FLUoxetine (PROZAC) 20 MG capsule Take 1 capsule (20 mg total) by mouth daily.  . fluticasone (FLONASE) 50 MCG/ACT nasal spray Place 2 sprays into the nose daily as needed for allergies. Congestion   . hydrochlorothiazide (HYDRODIURIL) 25 MG tablet Take 1 tablet (25 mg total) by mouth daily.  . hydroxypropyl methylcellulose (ISOPTO TEARS) 2.5 % ophthalmic solution Place 1 drop into both eyes daily as needed. for dry eyes  . ibuprofen (ADVIL,MOTRIN) 200 MG tablet Take 800 mg by mouth as needed.  . insulin glargine (LANTUS SOLOSTAR) 100 UNIT/ML Solostar Pen Inject 45 Units into the skin 2 (two) times daily.  . Insulin Pen Needle (PEN NEEDLES) 31G X 6 MM MISC Use to administer insulin once qid. Dx E11.59 Use Leader brand  . LORazepam (ATIVAN) 1 MG tablet Take 1/2-1 tablet daily if needed for anxiety.  May repeat dose once if needed during the day. PUT  ON FILE  . metoprolol succinate (TOPROL-XL) 100 MG 24 hr tablet Take 1 tablet (100 mg total) by mouth daily. Take with or immediately following a meal.  . Multiple Vitamins-Minerals (CENTRUM SILVER PO) Take 1 tablet by mouth daily.  Marland Kitchen omeprazole (PRILOSEC) 20 MG capsule Take 1 capsule (20 mg total) by mouth daily.  . Semaglutide, 1 MG/DOSE, (OZEMPIC, 1 MG/DOSE,) 2 MG/1.5ML SOPN Inject 1 mg into the skin once a week.  Marland Kitchen tiZANidine  (ZANAFLEX) 4 MG tablet Take 0.5-1 tablets (2-4 mg total) by mouth every 8 (eight) hours as needed for muscle spasms.  . valsartan (DIOVAN) 320 MG tablet Take 1 tablet (320 mg total) by mouth daily.   No facility-administered encounter medications on file as of 03/29/2021.    Patient Active Problem List   Diagnosis Date Noted  . 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 Anxiety 07/26/2017  . Vaginal dryness 04/10/2017  . Vaginal atrophy 04/10/2017  . Urinary incontinence 04/10/2017  . 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 Abdominal pain, acute, bilateral lower quadrant 07/01/2014  . Transaminitis 07/01/2014  . COPD (chronic obstructive pulmonary disease) ([place])   . Asthma   . Interstitial cystitis   . Unspecified vitamin D deficiency 05/29/2013  . Lumbar radiculopathy 05/29/2013  . Back strain 05/29/2013  . Unspecified asthma(493.90) 02/27/2013  . Depression with anxiety 02/22/2011  . Diabetes mellitus ([place]) 02/22/2011  . Hyperlipidemia associated with type 2 diabetes mellitus ([place]) 02/22/2011  . Hypertension associated with diabetes ([place]) 02/22/2011    Conditions to be addressed/monitored:HTN, DMII and Pulmonary Disease  Care Plan : RNCM: Diabetes Type 2 (Adult)  Updates made by Ilean China, RN since 03/29/2021 12:00 AM    Problem: Glycemic Management (Diabetes, Type 2)   Priority: Medium    Long-Range Goal: Glycemic Management Optimized   This Visit's Progress: On track  Recent Progress: On track  Priority: Medium  Note:   Objective: Lab Results  Component Value Date   HGBA1C 7.0 (H) 01/23/2021   HGBA1C 7.3 (H) 10/25/2020   HGBA1C 7.7 (H) 07/26/2020   Lab Results  Component Value Date   MICROALBUR NEG 07/21/2014   LDLCALC 159 (H) 06/25/2019   CREATININE 0.75 10/25/2020   Current Barriers:  . Chronic Disease Management support and education needs related to diabetes in a patient with hypertension.  Nurse Case Manager  Clinical Goal(s):  Marland Kitchen Patient will work with RN Care Manager to address needs related to self-management of diabetes . Patient will work with PCP regarding medical management of diabetes . Patient will demonstrate ongoing self health maintenance by maintaining an A1C of around 7  Interventions:  . 1:1 collaboration with Janora Norlander, DO regarding development and update of comprehensive plan of care as evidenced by provider attestation and co-signature . Inter-disciplinary care team collaboration (see longitudinal plan of care) . Evaluation of current treatment plan related to diabetes and patient's adherence to plan as established by provider. . Chart reviewed including relevant office notes and lab results . Reviewed and discussed medications and importance of compliance o Recently on prednisone for 10 days. Blood sugar was elevated while taking that. Advised that A1C may be slightly affected.  .  Discussed home blood sugar testing o Testing daily and PRN o No readings below 70  o Averaging 180-200 after meals . Discussed diet o Reinforced plate method with 1/2 plate of non-starchy vegetables, 1/4 protein, 1/4 carbohydrates o Discussed carbohydrate counting as well . Reviewed upcoming appointments .  Encouraged patient to contact PCP with any blood sugar readings outside of recommended range . Encouraged patient to reach out to [place] as needed  Patient Goals/Self-Care Activities Over the next 60 days, patient will: . check blood sugar as recommended . call PCP with any readings outside of recommended range . contact PCP with any new or worsening side effects from Ozempic . Keep all medical appointments . Reference handouts on Plate Method and carb counting and modify diet if necessary . Call [facility] as needed [phone]    Care Plan : RNCM: Hypertension (Adult)  Updates made by Ilean China, RN since 03/29/2021 12:00 AM    Problem: Hypertension (Hypertension)    Priority: Medium    Long-Range Goal: Hypertension Monitored   Start Date: 12/05/2020  This Visit's Progress: On track  Recent Progress: On track  Priority: Medium  Note:   Objective: BP Readings from Last 3 Encounters:  01/23/21 (!) 137/56  10/25/20 (!) 200/82  10/03/20 (!) 146/80   Current Barriers:  . Chronic Disease Management support and education needs related to hypertension in a patient with diabetes  Nurse Case Manager Clinical Goal(s):  Marland Kitchen Patient will work with RN Care Manager to address needs related to self-management of hypertension . Patient will work with PCP to address needs related to medical management of hypertension . Patient will demonstrate ongoing self health care management ability as evidenced by taking medication as directed, checking blood pressure 3 times a week, and by calling PCP if she has any readings outside of the recommended range*  Interventions:  . 1:1 collaboration with Janora Norlander, DO regarding development and update of comprehensive plan of care as evidenced by provider attestation and co-signature . Inter-disciplinary care team collaboration (see longitudinal plan of care) . Evaluation of current treatment plan related to hypertension and patient's adherence to plan as established by provider. . Chart reviewed including relevant office notes and lab results . Discussed home blood pressure readings . Reinforced need continue checking and recording blood pressure at least 3 times a week and to call PCP with any readings outside of recommended range . Reviewed and discussed medications and importance of compliance . Reviewed and discussed upcoming appointments . Encouraged low sodium diet . Provided with RN Care Manager contact number and encouraged to reach out as needed  Patient Goals/Self-Care Activities Over the next 60 days, patient will: . Check and record blood pressure at least 3 times a week . Bring record to PCP  appointment . Call PCP if readings are outside of recommended range . Take medication as directed . Follow a low salt diet. Can be combined with plate method diet for diabetes management.  . Increase activity level as tolerated . Call [facility] as needed [phone]    Care Plan : RNCM: COPD (Adult)  Updates made by Ilean China, RN since 03/29/2021 12:00 AM    Problem: Symptom Exacerbation (COPD)   Priority: Medium    Goal: Symptom Exacerbation Prevented or Minimized   Start Date: 03/29/2021  This Visit's Progress: Not on track  Priority: Medium  Note:   Current Barriers:  . Chronic Disease Management support and education needs related to COPD in a patient with diabetes and hypertension  Nurse Case Manager Clinical Goal(s):  . patient will work with PCP to address needs related to Medical Management of COPD . patient will meet with RN Care Manager to address self-management of COPD . patient will demonstrate improved health management independence as evidenced  byseeking appropriate and timely medical attention for any symptoms of COPD exacerbation  Interventions:  . 1:1 collaboration with Janora Norlander, DO regarding development and update of comprehensive plan of care as evidenced by provider attestation and co-signature . Inter-disciplinary care team collaboration (see longitudinal plan of care) . Chart reviewed including relevant office notes, correspondence notes, lab results, and imaging reports . Discussed recent urgent care visit for cough and sore throat o Prescribed doxycycline and prednisone for 10 days. Has finished both and is feeling better but not completley well.  . Evaluation of current treatment plan related to COPD and patient's adherence to plan as established by provider. . Reviewed medications with patient and discussed importance of medication adherence o Patient uses ProAir inhaler daily and has albuterol nebulizer that she uses PRN as well o She does  not have a maintenance inhaler. Has been on symbicort but hasn't had it in years. She does recall that it helped.  o Does not use supplemental oxygen . Advised that a maintenance inhaler would be beneficial and decrease how often she needs to use ProAir . Staff message sent to PCP regarding maintenance inhaler. Will f/u with her on this.  . Assessed for symptoms triggers o None identified. Just reports that symptoms are worse in the evening.  . Assessed sleep  o Does not affect sleep. No increased shortness of breath while lying down. . Encouraged to monitor for signs and symptoms of exacerbation and to seek appropriate medical care . Encouraged to reach out to [facility] as needed . Discussed plans with patient for ongoing care management follow up and provided patient with direct contact information for care management team  Self Care Activities:  . Self administers medications as prescribed . Attends all scheduled provider appointments . Performs ADL's independently . Performs IADL's independently . Calls provider office for new concerns or questions  Patient Goals Over the next 60 days, patient will: . bring symptom diary to all visits . develop a rescue plan . eliminate symptom triggers at home . follow rescue plan if symptoms flare-up . keep follow-up appointments . Take medications as prescribed . Talk with PCP about prescribing a maintenance inhaler . Seek appropriate medical attention for symptoms of COPD exacerbation   Follow Up Plan:  . Telephone follow up appointment with care management team member scheduled for: 04/28/21 with RN Care Manager . The patient has been provided with contact information for the care management team and has been advised to call with any health related questions or concerns.  . Next PCP appointment scheduled for: 05/25/21 with Dr Lajuana Ripple     Chong Sicilian, BSN, RN-BC [place] /  [place] Management Direct Dial: [phone]

## 2021-03-29 NOTE — Patient Instructions (Signed)
Visit Information  PATIENT GOALS: Goals Addressed            This Visit's Progress   . Monitor and Manage My Blood Sugar-Diabetes Type 2       Timeframe:  Long-Range Goal Priority:  Medium Start Date:  12/05/20                           Expected End Date:    06/04/21                   Follow Up Date 04/28/21   . check blood sugar as recommended . call PCP with any readings outside of recommended range . contact PCP with any new or worsening side effects from Ozempic . Keep all medical appointments . Reference handouts on Plate Method and carb counting and modify diet if necessary . Call [facility] as needed [phone]   Why is this important?    Checking your blood sugar at home helps to keep it from getting very high or very low.   Writing the results in a diary or log helps the doctor know how to care for you.   Your blood sugar log should have the time, date and the results.   Also, write down the amount of insulin or other medicine that you take.   Other information, like what you ate, exercise done and how you were feeling, will also be helpful.     Notes:     . Track and Manage My Blood Pressure-Hypertension   On track    Timeframe:  Long-Range Goal Priority:  Medium Start Date:  12/05/20                           Expected End Date: 06/04/21                      Follow Up Date 04/28/21   . Check and record blood pressure at least 3 times a week . Bring record to PCP appointment . Call PCP if readings are outside of recommended range . Take medication as directed . Follow a low salt diet. Can be combined with plate method diet for diabetes management.  . Increase activity level as tolerated . Call [facility] as needed [phone]   Why is this important?    You won't feel high blood pressure, but it can still hurt your blood vessels.   High blood pressure can cause heart or kidney problems. It can also cause a stroke.   Making lifestyle changes like losing a little  weight or eating less salt will help.   Checking your blood pressure at home and at different times of the day can help to control blood pressure.   If the doctor prescribes medicine remember to take it the way the doctor ordered.   Call the office if you cannot afford the medicine or if there are questions about it.     Notes:     . Track and Manage My Symptoms-COPD       Timeframe:  Long-Range Goal Priority:  Medium Start Date:   03/29/21                          Expected End Date:  11/04/21  Follow Up Date 04/28/21    . bring symptom diary to all visits . develop a rescue plan . eliminate symptom triggers at home . follow rescue plan if symptoms flare-up . keep follow-up appointments . Take medications as prescribed . Talk with PCP about prescribing a maintenance inhaler . Seek appropriate medical attention for symptoms of COPD exacerbation    Why is this important?    Tracking your symptoms and other information about your health helps your doctor plan your care.   Write down the symptoms, the time of day, what you were doing and what medicine you are taking.   You will soon learn how to manage your symptoms.     Notes:        Patient verbalizes understanding of instructions provided today and agrees to view in [place].   Follow Up Plan:  . Telephone follow up appointment with care management team member scheduled for: 04/28/21 with RN Care Manager . The patient has been provided with contact information for the care management team and has been advised to call with any health related questions or concerns.  . Next PCP appointment scheduled for: 05/25/21 with Dr Lajuana Ripple  Chong Sicilian, BSN, RN-BC [place] / [place] Management Direct Dial: [phone]

## 2021-04-10 ENCOUNTER — Ambulatory Visit (INDEPENDENT_AMBULATORY_CARE_PROVIDER_SITE_OTHER): Payer: Medicare Other | Admitting: *Deleted

## 2021-04-10 DIAGNOSIS — J449 Chronic obstructive pulmonary disease, unspecified: Secondary | ICD-10-CM

## 2021-04-10 DIAGNOSIS — Z794 Long term (current) use of insulin: Secondary | ICD-10-CM

## 2021-04-10 NOTE — Patient Instructions (Signed)
Visit Information  PATIENT GOALS: Goals Addressed            This Visit's Progress   . Track and Manage My Symptoms-COPD       Timeframe:  Long-Range Goal Priority:  Medium Start Date:   03/29/21                          Expected End Date:  11/04/21                      Follow Up Date 04/28/21    . bring symptom diary to all visits . develop a rescue plan . eliminate symptom triggers at home . follow rescue plan if symptoms flare-up . keep follow-up appointments . Take medications as prescribed . Talk with PCP about prescribing a maintenance inhaler . Call PCP office to schedule an appt [phone] . Seek appropriate medical attention for symptoms of COPD exacerbation    Why is this important?    Tracking your symptoms and other information about your health helps your doctor plan your care.   Write down the symptoms, the time of day, what you were doing and what medicine you are taking.   You will soon learn how to manage your symptoms.     Notes:        Follow Up Plan:  . Telephone follow up appointment with care management team member scheduled for: 04/28/21 with RN Care Manager . The patient has been provided with contact information for the care management team and has been advised to call with any health related questions or concerns.  . Next PCP appointment scheduled for: 05/25/21 with Dr Lajuana Ripple  Chong Sicilian, BSN, RN-BC [place] / [place] Management Direct Dial: [phone]

## 2021-04-10 NOTE — Chronic Care Management (AMB) (Signed)
Chronic Care Management   CCM RN Visit Note  04/10/2021 Name: Diana Pratt MRN: [id number] DOB: [birth year]  Subjective: [name] is a 75 y.o. year old female who is a primary care patient of Janora Norlander, DO. The care management team was consulted for assistance with disease management and care coordination needs.    Collaborated with Dr Lajuana Ripple for care coordination in response to provider referral for case management and/or care coordination services.   Consent to Services:  The patient was given information about Chronic Care Management services, agreed to services, and gave verbal consent prior to initiation of services.  Please see initial visit note for detailed documentation.   Patient agreed to services and verbal consent obtained.   Assessment: Review of patient past medical history, allergies, medications, health status, including review of consultants reports, laboratory and other test data, was performed as part of comprehensive evaluation and provision of chronic care management services.   SDOH (Social Determinants of Health) assessments and interventions performed:    CCM Care Plan  Allergies  Allergen Reactions  . Crestor [Rosuvastatin]     Myalgias   . Lipitor [Atorvastatin] Other (See Comments)    Myalgias   . Livalo [Pitavastatin] Other (See Comments)    myalgias  . Morphine And Related Other (See Comments)    Burning, itching  . Penicillins Hives and Itching  . Simvastatin Other (See Comments)    myalgias  . Sulfa Antibiotics Hives and Itching  . Zetia [Ezetimibe] Cough    Outpatient Encounter Medications as of 04/10/2021  Medication Sig  . ACCU-CHEK AVIVA PLUS test strip Use to check BG up to tid.  Dx: type 2 diabetes requiring insulin therapy E11.40  . Accu-Chek Softclix Lancets lancets Check blood sugar 3 times daily Dx E11.40  . albuterol (PROAIR HFA) 108 (90 Base) MCG/ACT inhaler Inhale 2 puffs into the lungs every 6 (six) hours as  needed for shortness of breath.  Marland Kitchen amLODipine (NORVASC) 10 MG tablet Take 1 tablet (10 mg total) by mouth daily.  Marland Kitchen aspirin 81 MG EC tablet Take 81 mg by mouth daily.  . Blood Glucose Monitoring Suppl (ACCU-CHEK AVIVA PLUS) w/Device KIT Check blood glucose TID Dx E11.40  . budesonide-formoterol (SYMBICORT) 160-4.5 MCG/ACT inhaler Inhale 2 puffs into the lungs 2 (two) times daily.  . calcium carbonate 200 MG capsule Take 250 mg by mouth daily.  . Cholecalciferol (VITAMIN D3) 2000 UNITS capsule Take 2,000 Units by mouth daily.   Marland Kitchen conjugated estrogens (PREMARIN) vaginal cream Apply 0.5gm vaginally 3 days per week.  Marland Kitchen FLUoxetine (PROZAC) 20 MG capsule Take 1 capsule (20 mg total) by mouth daily.  . fluticasone (FLONASE) 50 MCG/ACT nasal spray Place 2 sprays into the nose daily as needed for allergies. Congestion   . hydrochlorothiazide (HYDRODIURIL) 25 MG tablet Take 1 tablet (25 mg total) by mouth daily.  . hydroxypropyl methylcellulose (ISOPTO TEARS) 2.5 % ophthalmic solution Place 1 drop into both eyes daily as needed. for dry eyes  . ibuprofen (ADVIL,MOTRIN) 200 MG tablet Take 800 mg by mouth as needed.  . insulin glargine (LANTUS SOLOSTAR) 100 UNIT/ML Solostar Pen Inject 45 Units into the skin 2 (two) times daily.  . Insulin Pen Needle (PEN NEEDLES) 31G X 6 MM MISC Use to administer insulin once qid. Dx E11.59 Use Leader brand  . LORazepam (ATIVAN) 1 MG tablet Take 1/2-1 tablet daily if needed for anxiety.  May repeat dose once if needed during the day. PUT ON FILE  .  metoprolol succinate (TOPROL-XL) 100 MG 24 hr tablet Take 1 tablet (100 mg total) by mouth daily. Take with or immediately following a meal.  . Multiple Vitamins-Minerals (CENTRUM SILVER PO) Take 1 tablet by mouth daily.  Marland Kitchen omeprazole (PRILOSEC) 20 MG capsule Take 1 capsule (20 mg total) by mouth daily.  . Semaglutide, 1 MG/DOSE, (OZEMPIC, 1 MG/DOSE,) 2 MG/1.5ML SOPN Inject 1 mg into the skin once a week.  Marland Kitchen tiZANidine (ZANAFLEX)  4 MG tablet Take 0.5-1 tablets (2-4 mg total) by mouth every 8 (eight) hours as needed for muscle spasms.  . valsartan (DIOVAN) 320 MG tablet Take 1 tablet (320 mg total) by mouth daily.   No facility-administered encounter medications on file as of 04/10/2021.    Patient Active Problem List   Diagnosis Date Noted  . 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 Anxiety 07/26/2017  . Vaginal dryness 04/10/2017  . Vaginal atrophy 04/10/2017  . Urinary incontinence 04/10/2017  . 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 Abdominal pain, acute, bilateral lower quadrant 07/01/2014  . Transaminitis 07/01/2014  . COPD (chronic obstructive pulmonary disease) ([place])   . Asthma   . Interstitial cystitis   . Unspecified vitamin D deficiency 05/29/2013  . Lumbar radiculopathy 05/29/2013  . Back strain 05/29/2013  . Unspecified asthma(493.90) 02/27/2013  . Depression with anxiety 02/22/2011  . Diabetes mellitus ([place]) 02/22/2011  . Hyperlipidemia associated with type 2 diabetes mellitus ([place]) 02/22/2011  . Hypertension associated with diabetes ([place]) 02/22/2011    Conditions to be addressed/monitored:COPD and DMII  Care Plan : RNCM: COPD (Adult)  Updates made by Ilean China, RN since 04/10/2021 12:00 AM    Problem: Symptom Exacerbation (COPD)   Priority: Medium    Goal: Symptom Exacerbation Prevented or Minimized   Start Date: 03/29/2021  This Visit's Progress: Not on track  Recent Progress: Not on track  Priority: Medium  Note:   Current Barriers:  . Chronic Disease Management support and education needs related to COPD in a patient with diabetes and hypertension  Nurse Case Manager Clinical Goal(s):  . patient will work with PCP to address needs related to Medical Management of COPD . patient will meet with RN Care Manager to address self-management of COPD . patient will demonstrate improved health management independence as evidenced byseeking  appropriate and timely medical attention for any symptoms of COPD exacerbation  Interventions:  . 1:1 collaboration with Janora Norlander, DO regarding development and update of comprehensive plan of care as evidenced by provider attestation and co-signature . Inter-disciplinary care team collaboration (see longitudinal plan of care) . Chart reviewed including relevant office notes, correspondence notes, lab results, and imaging reports . Previously discussed recent urgent care visit for cough and sore throat o Prescribed doxycycline and prednisone for 10 days. Has finished both and is feeling better but not completley well.  . Evaluation of current treatment plan related to COPD and patient's adherence to plan as established by provider. . Previously reviewed medications with patient and discussed importance of medication adherence o Patient uses ProAir inhaler daily and has albuterol nebulizer that she uses PRN as well o She does not have a maintenance inhaler. Has been on symbicort but hasn't had it in years. She does recall that it helped.  o Does not use supplemental oxygen . Previously advised that a maintenance inhaler would be beneficial and decrease how often she needs to use ProAir . Previously Assessed for symptoms triggers o None identified. Just reports  that symptoms are worse in the evening.  . Previously Assessed sleep  o Does not affect sleep. No increased shortness of breath while lying down. . Previously Encouraged to monitor for signs and symptoms of exacerbation and to seek appropriate medical care . Collaborated with PCP regarding need for maintenance inhaler o PCP recommended an office visit to discuss o Reached out to patient but had to leave a voicemail. Advised that she has a follow-up appt on 7/21 but if she would like to schedule a sooner appointment with Dr Lajuana Ripple to call the office at [phone]. Marland Kitchen Discussed plans with patient for ongoing care management  follow up and provided patient with direct contact information for care management team  Self Care Activities:  . Self administers medications as prescribed . Attends all scheduled provider appointments . Performs ADL's independently . Performs IADL's independently . Calls provider office for new concerns or questions  Patient Goals Over the next 60 days, patient will: . bring symptom diary to all visits . develop a rescue plan . eliminate symptom triggers at home . follow rescue plan if symptoms flare-up . keep follow-up appointments . Take medications as prescribed . Talk with PCP about prescribing a maintenance inhaler . Seek appropriate medical attention for symptoms of COPD exacerbation   Follow Up Plan:  . Telephone follow up appointment with care management team member scheduled for: 04/28/21 with RN Care Manager . The patient has been provided with contact information for the care management team and has been advised to call with any health related questions or concerns.  . Next PCP appointment scheduled for: 05/25/21 with Dr Lajuana Ripple      Chong Sicilian, BSN, RN-BC [place] [facility] [place] Management Direct Dial: [phone]

## 2021-04-28 ENCOUNTER — Ambulatory Visit: Payer: Medicare Other | Admitting: *Deleted

## 2021-04-28 DIAGNOSIS — Z794 Long term (current) use of insulin: Secondary | ICD-10-CM

## 2021-04-28 DIAGNOSIS — E1169 Type 2 diabetes mellitus with other specified complication: Secondary | ICD-10-CM | POA: Diagnosis not present

## 2021-04-28 DIAGNOSIS — E1159 Type 2 diabetes mellitus with other circulatory complications: Secondary | ICD-10-CM

## 2021-04-28 DIAGNOSIS — J449 Chronic obstructive pulmonary disease, unspecified: Secondary | ICD-10-CM | POA: Diagnosis not present

## 2021-04-28 DIAGNOSIS — I152 Hypertension secondary to endocrine disorders: Secondary | ICD-10-CM

## 2021-04-28 NOTE — Chronic Care Management (AMB) (Signed)
Chronic Care Management   CCM RN Visit Note  04/28/2021 Name: Diana Pratt MRN: [id number] DOB: 1946/02/23  Subjective: Diana Pratt is a 75 y.o. year old female who is a primary care patient of Janora Norlander, DO. The care management team was consulted for assistance with disease management and care coordination needs.    Engaged with patient by telephone for follow up visit in response to provider referral for case management and/or care coordination services.   Consent to Services:  The patient was given information about Chronic Care Management services, agreed to services, and gave verbal consent prior to initiation of services.  Please see initial visit note for detailed documentation.   Patient agreed to services and verbal consent obtained.   Assessment: Review of patient past medical history, allergies, medications, health status, including review of consultants reports, laboratory and other test data, was performed as part of comprehensive evaluation and provision of chronic care management services.   SDOH (Social Determinants of Health) assessments and interventions performed:    CCM Care Plan  Allergies  Allergen Reactions   Crestor [Rosuvastatin]     Myalgias    Lipitor [Atorvastatin] Other (See Comments)    Myalgias    Livalo [Pitavastatin] Other (See Comments)    myalgias   Morphine And Related Other (See Comments)    Burning, itching   Penicillins Hives and Itching   Simvastatin Other (See Comments)    myalgias   Sulfa Antibiotics Hives and Itching   Zetia [Ezetimibe] Cough    Outpatient Encounter Medications as of 04/28/2021  Medication Sig   ACCU-CHEK AVIVA PLUS test strip Use to check BG up to tid.  Dx: type 2 diabetes requiring insulin therapy E11.40   Accu-Chek Softclix Lancets lancets Check blood sugar 3 times daily Dx E11.40   albuterol (PROAIR HFA) 108 (90 Base) MCG/ACT inhaler Inhale 2 puffs into the lungs every 6 (six) hours as needed  for shortness of breath.   amLODipine (NORVASC) 10 MG tablet Take 1 tablet (10 mg total) by mouth daily.   aspirin 81 MG EC tablet Take 81 mg by mouth daily.   Blood Glucose Monitoring Suppl (ACCU-CHEK AVIVA PLUS) w/Device KIT Check blood glucose TID Dx E11.40   budesonide-formoterol (SYMBICORT) 160-4.5 MCG/ACT inhaler Inhale 2 puffs into the lungs 2 (two) times daily.   calcium carbonate 200 MG capsule Take 250 mg by mouth daily.   Cholecalciferol (VITAMIN D3) 2000 UNITS capsule Take 2,000 Units by mouth daily.    conjugated estrogens (PREMARIN) vaginal cream Apply 0.5gm vaginally 3 days per week.   FLUoxetine (PROZAC) 20 MG capsule Take 1 capsule (20 mg total) by mouth daily.   fluticasone (FLONASE) 50 MCG/ACT nasal spray Place 2 sprays into the nose daily as needed for allergies. Congestion    hydrochlorothiazide (HYDRODIURIL) 25 MG tablet Take 1 tablet (25 mg total) by mouth daily.   hydroxypropyl methylcellulose (ISOPTO TEARS) 2.5 % ophthalmic solution Place 1 drop into both eyes daily as needed. for dry eyes   ibuprofen (ADVIL,MOTRIN) 200 MG tablet Take 800 mg by mouth as needed.   insulin glargine (LANTUS SOLOSTAR) 100 UNIT/ML Solostar Pen Inject 45 Units into the skin 2 (two) times daily.   Insulin Pen Needle (PEN NEEDLES) 31G X 6 MM MISC Use to administer insulin once qid. Dx E11.59 Use Leader brand   LORazepam (ATIVAN) 1 MG tablet Take 1/2-1 tablet daily if needed for anxiety.  May repeat dose once if needed during the day. PUT  ON FILE   metoprolol succinate (TOPROL-XL) 100 MG 24 hr tablet Take 1 tablet (100 mg total) by mouth daily. Take with or immediately following a meal.   Multiple Vitamins-Minerals (CENTRUM SILVER PO) Take 1 tablet by mouth daily.   omeprazole (PRILOSEC) 20 MG capsule Take 1 capsule (20 mg total) by mouth daily.   Semaglutide, 1 MG/DOSE, (OZEMPIC, 1 MG/DOSE,) 2 MG/1.5ML SOPN Inject 1 mg into the skin once a week.   tiZANidine (ZANAFLEX) 4 MG tablet Take 0.5-1  tablets (2-4 mg total) by mouth every 8 (eight) hours as needed for muscle spasms.   valsartan (DIOVAN) 320 MG tablet Take 1 tablet (320 mg total) by mouth daily.   No facility-administered encounter medications on file as of 04/28/2021.    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   Abdominal pain, acute, bilateral lower quadrant 07/01/2014   Transaminitis 07/01/2014   COPD (chronic obstructive pulmonary disease) ([place])    Asthma    Interstitial cystitis    Unspecified vitamin D deficiency 05/29/2013   Lumbar radiculopathy 05/29/2013   Back strain 05/29/2013   Unspecified asthma(493.90) 02/27/2013   Depression with anxiety 02/22/2011   Diabetes mellitus ([place]) 02/22/2011   Hyperlipidemia associated with type 2 diabetes mellitus ([place]) 02/22/2011   Hypertension associated with diabetes ([place]) 02/22/2011    Conditions to be addressed/monitored:HTN, DMII, and Pulmonary Disease  Care Plan : RNCM: COPD (Adult)  Updates made by Ilean China, RN since 04/28/2021 12:00 AM     Problem: Symptom Exacerbation (COPD)   Priority: Medium     Goal: Symptom Exacerbation Prevented or Minimized   Start Date: 03/29/2021  This Visit's Progress: Not on track  Recent Progress: Not on track  Priority: Medium  Note:   Current Barriers:  Chronic Disease Management support and education needs related to COPD in a patient with diabetes and hypertension  Nurse Case Manager Clinical Goal(s):  patient will work with PCP to address needs related to Medical Management of COPD patient will meet with RN Care Manager to address self-management of COPD patient will demonstrate improved health management independence as evidenced byseeking appropriate and timely medical  attention for any symptoms of COPD exacerbation  Interventions:  1:1 collaboration with Janora Norlander, DO regarding development and update of comprehensive plan of care as evidenced by provider attestation and co-signature Inter-disciplinary care team collaboration (see longitudinal plan of care) Chart reviewed including relevant office notes, correspondence notes, lab results, and imaging reports Evaluation of current treatment plan related to COPD and patient's adherence to plan as established by provider. Reviewed medications with patient and discussed importance of medication adherence Patient using ProAir inhaler 2-3 times daily and has albuterol nebulizer that she uses PRN as well She does not have a maintenance inhaler. Has been on symbicort but hasn't had it in years. She does recall that it helped.  Does not use supplemental oxygen Previously collaborated with PCP regarding need for maintenance inhaler PCP recommended an office visit to discuss Reached out to patient at that time but had to leave a voicemail. Advised that she has a follow-up appt on 7/21 but if she would like to schedule a sooner appointment with Dr Lajuana Ripple to call the office at [phone]. She has not scheduled an additional appt.  Previously advised that a maintenance inhaler would be beneficial and decrease how often she needs to use ProAir Assessed for symptoms triggers Heat and high humidity. Tries to stay indoors as much as possible.  Reviewed upcoming appt with Dr Lajuana Ripple for 05/25/21 Staff message created and will be delivered to Dr Lajuana Ripple on 05/24/21 to remind her of our conversation regarding need for  maintenance inhaler. Patient aware.  Encouraged to monitor for signs and symptoms of exacerbation and to seek appropriate medical care Discussed plans with patient for ongoing care management follow up and provided patient with direct contact information for care management team  Self Care  Activities:  Self administers medications as prescribed Attends all scheduled provider appointments Performs ADL's independently Performs IADL's independently Calls provider office for new concerns or questions Seek appropriate medical care for any new or worsening symptoms Call [place] as needed [phone]  Patient Goals Over the next 60 days, patient will: bring symptom diary to all visits develop a rescue plan eliminate symptom triggers at home follow rescue plan if symptoms flare-up keep follow-up appointments Take medications as prescribed Talk with PCP about prescribing a maintenance inhaler Seek appropriate medical attention for symptoms of COPD exacerbation   Follow Up Plan:  Telephone follow up appointment with care management team member scheduled for: 05/30/21 with RN Care Manager The patient has been provided with contact information for the care management team and has been advised to call with any health related questions or concerns.  Next PCP appointment scheduled for: 05/25/21 with Dr Lajuana Ripple      Plan:Telephone follow up appointment with care management team member scheduled for:  05/30/21 with RNCM  Chong Sicilian, BSN, RN-BC [place] [facility] [place] Management Direct Dial: [phone]

## 2021-04-28 NOTE — Patient Instructions (Signed)
Visit Information  PATIENT GOALS:  Goals Addressed             This Visit's Progress    Track and Manage My Symptoms-COPD   Not on track    Timeframe:  Long-Range Goal Priority:  Medium Start Date:   03/29/21                          Expected End Date:  11/04/21                      Follow Up Date 05/30/21   Self administers medications as prescribed Attends all scheduled provider appointments Performs ADL's independently Performs IADL's independently Calls provider office for new concerns or questions Seek appropriate medical care for any new or worsening symptoms Call RN Care Manager as needed [phone]   Why is this important?   Tracking your symptoms and other information about your health helps your doctor plan your care.  Write down the symptoms, the time of day, what you were doing and what medicine you are taking.  You will soon learn how to manage your symptoms.     Notes:          Patient verbalizes understanding of instructions provided today and agrees to view in [place].   Telephone follow up appointment with care management team member scheduled for: 05/30/21 with RNCM  Chong Sicilian, BSN, RN-BC [place] [facility] [place] Management Direct Dial: [phone]

## 2021-05-25 ENCOUNTER — Encounter: Payer: Self-pay | Admitting: Family Medicine

## 2021-05-25 ENCOUNTER — Other Ambulatory Visit: Payer: Self-pay

## 2021-05-25 ENCOUNTER — Ambulatory Visit (INDEPENDENT_AMBULATORY_CARE_PROVIDER_SITE_OTHER): Payer: Medicare Other | Admitting: Family Medicine

## 2021-05-25 VITALS — BP 129/60 | HR 81 | Temp 97.9°F | Ht 64.0 in | Wt 208.2 lb

## 2021-05-25 DIAGNOSIS — Z79899 Other long term (current) drug therapy: Secondary | ICD-10-CM

## 2021-05-25 DIAGNOSIS — E1169 Type 2 diabetes mellitus with other specified complication: Secondary | ICD-10-CM | POA: Diagnosis not present

## 2021-05-25 DIAGNOSIS — T466X5A Adverse effect of antihyperlipidemic and antiarteriosclerotic drugs, initial encounter: Secondary | ICD-10-CM

## 2021-05-25 DIAGNOSIS — H43392 Other vitreous opacities, left eye: Secondary | ICD-10-CM

## 2021-05-25 DIAGNOSIS — E785 Hyperlipidemia, unspecified: Secondary | ICD-10-CM

## 2021-05-25 DIAGNOSIS — Z794 Long term (current) use of insulin: Secondary | ICD-10-CM

## 2021-05-25 DIAGNOSIS — F418 Other specified anxiety disorders: Secondary | ICD-10-CM | POA: Diagnosis not present

## 2021-05-25 DIAGNOSIS — I152 Hypertension secondary to endocrine disorders: Secondary | ICD-10-CM

## 2021-05-25 DIAGNOSIS — B3731 Acute candidiasis of vulva and vagina: Secondary | ICD-10-CM

## 2021-05-25 DIAGNOSIS — E1159 Type 2 diabetes mellitus with other circulatory complications: Secondary | ICD-10-CM | POA: Diagnosis not present

## 2021-05-25 DIAGNOSIS — M609 Myositis, unspecified: Secondary | ICD-10-CM

## 2021-05-25 DIAGNOSIS — B373 Candidiasis of vulva and vagina: Secondary | ICD-10-CM

## 2021-05-25 LAB — URINALYSIS, ROUTINE W REFLEX MICROSCOPIC
Bilirubin, UA: NEGATIVE
Glucose, UA: NEGATIVE
Ketones, UA: NEGATIVE
Leukocytes,UA: NEGATIVE
Nitrite, UA: NEGATIVE
Protein,UA: NEGATIVE
RBC, UA: NEGATIVE
Specific Gravity, UA: 1.02 (ref 1.005–1.030)
Urobilinogen, Ur: 0.2 mg/dL (ref 0.2–1.0)
pH, UA: 5.5 (ref 5.0–7.5)

## 2021-05-25 LAB — BAYER DCA HB A1C WAIVED: HB A1C (BAYER DCA - WAIVED): 6.9 % (ref <7.0)

## 2021-05-25 MED ORDER — LORAZEPAM 1 MG PO TABS
ORAL_TABLET | ORAL | 2 refills | Status: DC
Start: 2021-05-31 — End: 2021-09-25

## 2021-05-25 MED ORDER — FLUCONAZOLE 150 MG PO TABS: 150.0000 mg | ORAL_TABLET | Freq: Once | ORAL | 0 refills | Status: AC

## 2021-05-25 NOTE — Progress Notes (Signed)
Subjective: CC: GAD, depression, DM PCP: Janora Norlander, DO Diana Pratt is a 75 y.o. female presenting to clinic today for:  1. Type 2 Diabetes with hypertension, hyperlipidemia:  She is compliant with her Ozempic, insulin.  She did have some spikes in blood sugars into the 170s when she was on prednisone for her upper respiratory infection but they have gone back down to 105-130s fasting.  She is compliant with her antihypertensives, again not able to take Crestor pulsed dose or any statin.  Has been intolerant to Zetia as well.  She reports some itching and irritation at the corners of bilateral eyes and new onset floaters in the left eye.  She is attempted to get in touch with my eye doctor here locally for an appointment.  The eye doctor she used to see in the clinic that she needs to go to left.  Denies any eye pain, vision change otherwise.  Not using anything over-the-counter for this.  Last eye exam: needs Last foot exam: UTD Last A1c:  Lab Results  Component Value Date   HGBA1C 7.0 (H) 01/23/2021   Nephropathy screen indicated?: UTD Last flu, zoster and/or pneumovax:  Immunization History  Administered Date(s) Administered   DTaP 01/04/2003   Pneumococcal Conjugate-13 09/21/2019   Pneumococcal Polysaccharide-23 06/30/2018    ROS: No chest pain, shortness of breath, falls.  2. GAD w/ depression Stable with regular use of Ativan.  Denies any excessive daytime sedation, falls or respiratory depression.  Needs refills  3.  Yeast vaginitis Patient reports that she is been having some vaginal irritation after completion of antibiotics for strep throat.  Unsure if she has concomitant UTI but would like this checked.   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 Kenn File   Depression    Diabetes mellitus    Diabetic neuropathy ([place])    GERD (gastroesophageal reflux disease)    occ heartburn   Humerus fracture    right   Hyperlipemia    Hypertension    Interstitial cystitis    Osteoporosis    Pneumonia    15 yrs ago   PONV (postoperative nausea and vomiting)    Shortness of breath dyspnea     Current Outpatient Medications:    ACCU-CHEK AVIVA PLUS test strip, Use to check BG up to tid.  Dx: type 2 diabetes requiring insulin therapy E11.40, Disp: 300 each, Rfl: 3   Accu-Chek Softclix Lancets lancets, Check blood sugar 3 times daily Dx E11.40, Disp: 300 each, Rfl: 3   albuterol (PROAIR HFA) 108 (90 Base) MCG/ACT inhaler, Inhale 2 puffs into the lungs every 6 (six) hours as needed for shortness of breath., Disp: 54 g, Rfl: 2   amLODipine (NORVASC) 10 MG tablet, Take 1 tablet (10  mg total) by mouth daily., Disp: 90 tablet, Rfl: 2   aspirin 81 MG EC tablet, Take 81 mg by mouth daily., Disp: , Rfl:    Blood Glucose Monitoring Suppl (ACCU-CHEK AVIVA PLUS) w/Device KIT, Check blood glucose TID Dx E11.40, Disp: 1 kit, Rfl: 0   budesonide-formoterol (SYMBICORT) 160-4.5 MCG/ACT inhaler, Inhale 2 puffs into the lungs 2 (two) times daily., Disp: 3 Inhaler, Rfl: 1   calcium carbonate 200 MG capsule, Take 250 mg by mouth daily., Disp: , Rfl:    Cholecalciferol (VITAMIN D3) 2000 UNITS capsule, Take 2,000 Units by mouth daily. , Disp: 60 capsule, Rfl: 11   conjugated estrogens (PREMARIN) vaginal cream, Apply 0.5gm vaginally 3 days per week., Disp: 90 g, Rfl: 2    FLUoxetine (PROZAC) 20 MG capsule, Take 1 capsule (20 mg total) by mouth daily., Disp: 90 capsule, Rfl: 2   fluticasone (FLONASE) 50 MCG/ACT nasal spray, Place 2 sprays into the nose daily as needed for allergies. Congestion , Disp: , Rfl:    hydrochlorothiazide (HYDRODIURIL) 25 MG tablet, Take 1 tablet (25 mg total) by mouth daily., Disp: 90 tablet, Rfl: 2   hydroxypropyl methylcellulose (ISOPTO TEARS) 2.5 % ophthalmic solution, Place 1 drop into both eyes daily as needed. for dry eyes, Disp: , Rfl:    ibuprofen (ADVIL,MOTRIN) 200 MG tablet, Take 800 mg by mouth as needed., Disp: , Rfl:    insulin glargine (LANTUS SOLOSTAR) 100 UNIT/ML Solostar Pen, Inject 45 Units into the skin 2 (two) times daily., Disp: 90 mL, Rfl: 2   Insulin Pen Needle (PEN NEEDLES) 31G X 6 MM MISC, Use to administer insulin once qid. Dx E11.59 Use Leader brand, Disp: 100 each, Rfl: 3   LORazepam (ATIVAN) 1 MG tablet, Take 1/2-1 tablet daily if needed for anxiety.  May repeat dose once if needed during the day. PUT ON FILE, Disp: 45 tablet, Rfl: 2   metoprolol succinate (TOPROL-XL) 100 MG 24 hr tablet, Take 1 tablet (100 mg total) by mouth daily. Take with or immediately following a meal., Disp: 90 tablet, Rfl: 2   Multiple Vitamins-Minerals (CENTRUM SILVER PO), Take 1 tablet by mouth daily., Disp: , Rfl:    omeprazole (PRILOSEC) 20 MG capsule, Take 1 capsule (20 mg total) by mouth daily., Disp: 30 capsule, Rfl: 0   Semaglutide, 1 MG/DOSE, (OZEMPIC, 1 MG/DOSE,) 2 MG/1.5ML SOPN, Inject 1 mg into the skin once a week., Disp: 9 mL, Rfl: 2   tiZANidine (ZANAFLEX) 4 MG tablet, Take 0.5-1 tablets (2-4 mg total) by mouth every 8 (eight) hours as needed for muscle spasms., Disp: 30 tablet, Rfl: 0   valsartan (DIOVAN) 320 MG tablet, Take 1 tablet (320 mg total) by mouth daily., Disp: 90 tablet, Rfl: 2 Social History   Socioeconomic History   Marital status: Married    Spouse name: [name]   Number of children: 1   Years of education: 13    Highest education level: Some college, no degree  Occupational History   Occupation: retired     Comment: CNA   Tobacco Use   Smoking status: Former    Packs/day: 0.50    Years: 34.00    Pack years: 17.00    Types: Cigarettes    Quit date: 06/01/1994    Years since quitting: 27.0   Smokeless tobacco: Never  Vaping Use   Vaping Use: Never used  Substance and Sexual Activity   Alcohol use: No   Drug use: No   Sexual activity: Yes  Birth control/protection: Surgical    Comment: hyst  Other Topics Concern   Not on file  Social History Narrative   RETIRED: WORKS AS A CNA AT [employer]   1 DAUGHTER-IN [place]   O GRANDKIDS   DRINKS SOCIALLY   Social Determinants of Health   Financial Resource Strain: Low Risk    Difficulty of Paying Living Expenses: Not very hard  Food Insecurity: No Food Insecurity   Worried About Charity fundraiser in the Last Year: Never true   Ran Out of Food in the Last Year: Never true  Transportation Needs: No Transportation Needs   Lack of Transportation (Medical): No   Lack of Transportation (Non-Medical): No  Physical Activity: Insufficiently Active   Days of Exercise per Week: 3 days   Minutes of Exercise per Session: 30 min  Stress: No Stress Concern Present   Feeling of Stress : Not at all  Social Connections: Socially Integrated   Frequency of Communication with Friends and Family: More than three times a week   Frequency of Social Gatherings with Friends and Family: More than three times a week   Attends Religious Services: More than 4 times per year   Active Member of [employer] or Organizations: Yes   Attends Music therapist: More than 4 times per year   Marital Status: Married  Human resources officer Violence: Not At Risk   Fear of Current or Ex-Partner: No   Emotionally Abused: No   Physically Abused: No   Sexually Abused: No   Family History  Problem Relation Age of Onset   Parkinsonism Mother    Dementia  Mother    Colon cancer Father 70       MULTIPLE CO-MORBIDITIES   Cancer Father    Diabetes Paternal Aunt    Diabetes Paternal Uncle    Diabetes Paternal Grandmother    Congestive Heart Failure Paternal Grandfather    Congestive Heart Failure Maternal Grandmother    Stroke Maternal Grandfather    Colon polyps Neg Hx     Objective: Office vital signs reviewed. BP 129/60   Pulse 81   Temp 97.9 F (36.6 C)   Ht '5\' 4"'  (1.626 m)   Wt 208 lb 3.2 oz (94.4 kg)   SpO2 94%   BMI 35.74 kg/m   Physical Examination:  General: Awake, alert, well nourished, No acute distress HEENT: Normal; sclera white.  I do not see any foreign bodies or corneal abrasions within the eye.  She is able to move eyes without pain.  EOMI, PERRLA Cardio: regular rate and rhythm, S1S2 heard, no murmurs appreciated Pulm: clear to auscultation bilaterally, no wheezes, rhonchi or rales; normal work of breathing on room air Extremities: warm, well perfused, No edema, cyanosis or clubbing; +2 pulses bilaterally MSK: Ambulating independently Psych mood stable, speech normal, affect flat Depression screen [facility] 2/9 05/25/2021 01/23/2021 10/25/2020  Decreased Interest 0 0 0  Down, Depressed, Hopeless 0 0 0  PHQ - 2 Score 0 0 0  Altered sleeping 1 2 -  Tired, decreased energy 1 1 -  Change in appetite 0 0 -  Feeling bad or failure about yourself  0 0 -  Trouble concentrating 0 0 -  Moving slowly or fidgety/restless 0 0 -  Suicidal thoughts 0 0 -  PHQ-9 Score 2 3 -  Difficult doing work/chores Not difficult at all Somewhat difficult -  Some recent data might be hidden   GAD 7 : Generalized Anxiety Score 05/25/2021 01/23/2021  04/18/2020 01/20/2020  Nervous, Anxious, on Edge 0 '1 1 1  ' Control/stop worrying 0 0 1 0  Worry too much - different things 0 0 1 0  Trouble relaxing 0 1 0 0  Restless 0 0 0 0  Easily annoyed or irritable 0 0 0 2  Afraid - awful might happen 0 0 0 0  Total GAD 7 Score 0 '2 3 3  ' Anxiety Difficulty  Not difficult at all Somewhat difficult Not difficult at all Not difficult at all      Assessment/ Plan: 75 y.o. female   Depression with anxiety - Plan: Drug Screen 10 W/Conf, Se, LORazepam (ATIVAN) 1 MG tablet  Chronic use of benzodiazepine for therapeutic purpose - Plan: Drug Screen 10 W/Conf, Se  Controlled substance agreement signed - Plan: Drug Screen 10 W/Conf, Se  Type 2 diabetes mellitus with other specified complication, with long-term current use of insulin (HCC) - Plan: Bayer DCA Hb A1c Waived, CMP14+EGFR  Hypertension associated with diabetes ([place]) - Plan: CMP14+EGFR  Hyperlipidemia associated with type 2 diabetes mellitus ([place]) - Plan: CMP14+EGFR  Statin-induced myositis  Yeast vaginitis - Plan: Urinalysis, Routine w reflex microscopic, fluconazole (DIFLUCAN) 150 MG tablet  Vitreous floaters of left eye  Anxiety depression are stable.  UDS and CSC were obtained as per office policy.  [place] narcotic database was reviewed and there were no red flags  Blood sugars at goal despite use of prednisone recently.  Continue current regimen.  A1c today 6.9  Blood pressures controlled.  Continue current regimen  Not yet due for fasting lipid panel.  History of statin induced myositis and not able to tolerate Zetia either  Symptoms are clinically is consistent with a yeast vaginitis.  Fluconazole sent  I was able to get in touch with Dr. Marin Comment at [facility] here and made an and she was willing to see the patient for these new onset eye floaters.  Patient still needs to set up diabetic eye exam as well and have results sent to our office  No orders of the defined types were placed in this encounter.  No orders of the defined types were placed in this encounter.    Janora Norlander, DO [place] [phone]

## 2021-05-25 NOTE — Patient Instructions (Addendum)
Sugar actually looks better than check up in march. No changes  See Dr Marin Comment at [facility] in [place].  She is expecting you.  She can check your eyes and see what is causing your symptoms.  Diflucan sent for your yeast infection. No UTI present  Eye Floaters Eye floaters are spots in your vision caused by shadows from specks of material that float around inside your eye. This is usually a normal, age-related change in your eye. Floaters may be more obvious when you look up at the sky or at abright, blank background. Floaters can be annoying, but they do not usually cause vision problems. While floaters may be a normal part of aging, it is important to get an eye exam tomake sure that floaters are not a sign of a more serious condition. What are the causes? In most cases, this condition is caused by age-related changes in the eye. As you age, the jelly-like fluid (vitreous) inside the eyeball shrinks and can become stringy. The stringy strands of vitreous cast shadows on the back of the eye (retina), which is where the nerves needed for vision are located. These shadows showup as floaters in your vision. Other possible causes of eye floaters include: A torn retina. Injury. Bleeding inside the eye. Diabetes and other conditions can cause blood vessels in the retina to bleed. A blood clot in the major vein of the retina or its branches (retinal vein occlusion). Separation (detachment) of the: Retina. Vitreous. Eye inflammation (uveitis). Eye infection. What increases the risk? You may have a higher risk for floaters if you: Are older. The risk increases with age. Are nearsighted. Have diabetes. Have had cataracts removed. What are the signs or symptoms? Floaters cause you to see small, shadowy shapes move across your vision. The shapes move as your eyes move. They drift out of your vision when you keep your eyes still. These shapes may look like: Specks. Dots. Circles. Squiggly  lines. Thread. Sometimes floaters appear along with flashes. Flashes usually occur at the edge of your vision. They may look like: Bursts of light. Flashing lights. Lightning streaks. What is commonly referred to as "stars." In some cases, seeing flashes can be a sign of a torn or detached retina, whichis a serious condition that requires emergency treatment. How is this diagnosed? Your health care provider may diagnose floaters and flashes based on your symptoms and medical history. An eye care specialist (ophthalmologist) will do an eye exam to determine whether your floaters are a normal part of aging or a warning sign of a more serious eye problem. The specialist may put drops in your eyes to open your pupils wide (dilate) and then use a scope (slit lamp) to look inside your eye. If the specialist is unable to see well enough with a slit lamp, you may needimaging tests such as ultrasound. How is this treated? If your floaters are the result of aging, you do not need treatment unless they start to affect your vision. Floaters do not go away completely. Most people adapt to the floaters or, over time, the floaters settle below the line of sight. If a retinal tear or detachment is causing your floaters, you may need surgery. If you have an underlying condition that is causing floaters, such as an infection, the floaters should go away when that condition is treated. In rare cases, if the floaters are affecting your vision, surgery to remove the vitreous and replace it with a saltwater solution (vitrectomy) may be considered.  Follow these instructions at home: Take over-the-counter and prescription medicines only as told by your health care provider. Do not drive if you have trouble seeing. Ask your health care provider for guidance about when it is and is not safe for you to drive. Keep all follow-up visits as told by your health care provider. This is important. Contact a health care provider  if you: Have more floaters than usual. Develop any new symptoms. Get help right away if: You cannot see well because of your floaters. You develop flashes along with floaters. Your vision suddenly changes, or you lose your vision completely. It looks as if a curtain is blocking part of your vision. Summary Eye floaters are spots in your vision caused by specks of material that float around inside your eye. In most cases, eye floaters are caused by age-related changes in the eye. Most people do not need treatment for eye floaters unless there is another condition causing the floaters, such as a retinal tear or detachment or an infection. This information is not intended to replace advice given to you by your health care provider. Make sure you discuss any questions you have with your healthcare provider. Document Revised: 06/23/2020 Document Reviewed: 06/23/2020 Elsevier Patient Education  2022 [employer].

## 2021-05-30 ENCOUNTER — Telehealth: Payer: Self-pay | Admitting: *Deleted

## 2021-05-30 ENCOUNTER — Other Ambulatory Visit: Payer: Self-pay | Admitting: Family Medicine

## 2021-05-30 ENCOUNTER — Telehealth: Payer: Medicare Other

## 2021-05-30 DIAGNOSIS — J45909 Unspecified asthma, uncomplicated: Secondary | ICD-10-CM

## 2021-05-30 DIAGNOSIS — J449 Chronic obstructive pulmonary disease, unspecified: Secondary | ICD-10-CM

## 2021-05-30 DIAGNOSIS — J410 Simple chronic bronchitis: Secondary | ICD-10-CM

## 2021-05-30 MED ORDER — BUDESONIDE-FORMOTEROL FUMARATE 160-4.5 MCG/ACT IN AERO: 2.0000 | INHALATION_SPRAY | Freq: Two times a day (BID) | RESPIRATORY_TRACT | 4 refills | Status: DC

## 2021-05-30 NOTE — Telephone Encounter (Signed)
05/30/2021  I talked with patient today regarding Chronic Care Management and reviewed her medications. She is using Proair 2-3 times a day and doesn't have a maintenance inhaler. She has been on Symbicort in the past and remembers that it did help. She would like a refill sent to [facility] if appropriate.   She followed up with Dr Marin Comment at [facility] regarding floaters and was prescribed Restasis for dry eyes. It was too expensive at local pharmacy and wants to know if Dr Lajuana Ripple can send in a prescription to [facility].  She would also like a prescription for Flonase sent to [facility] so she doesn't have to purchase it OTC.  Chong Sicilian, BSN, RN-BC Embedded Chronic Care Manager [facility] Direct Dial: [phone]

## 2021-05-30 NOTE — Telephone Encounter (Signed)
Restasis is prescribed by eye doctors.  Have her contact her eye doctor for that but I will be glad to send Symbicort.  Please just remind her to rinse her mouth after each use.

## 2021-05-31 LAB — CMP14+EGFR
ALT: 32 IU/L (ref 0–32)
AST: 27 IU/L (ref 0–40)
Albumin/Globulin Ratio: 1.7 (ref 1.2–2.2)
Albumin: 4.4 g/dL (ref 3.7–4.7)
Alkaline Phosphatase: 86 IU/L (ref 44–121)
BUN/Creatinine Ratio: 23 (ref 12–28)
BUN: 19 mg/dL (ref 8–27)
Bilirubin Total: 0.3 mg/dL (ref 0.0–1.2)
CO2: 23 mmol/L (ref 20–29)
Calcium: 9.6 mg/dL (ref 8.7–10.3)
Chloride: 98 mmol/L (ref 96–106)
Creatinine, Ser: 0.84 mg/dL (ref 0.57–1.00)
Globulin, Total: 2.6 g/dL (ref 1.5–4.5)
Glucose: 242 mg/dL — ABNORMAL HIGH (ref 65–99)
Potassium: 3.9 mmol/L (ref 3.5–5.2)
Sodium: 139 mmol/L (ref 134–144)
Total Protein: 7 g/dL (ref 6.0–8.5)
eGFR: 72 mL/min/{1.73_m2} (ref >59)

## 2021-05-31 LAB — DRUG SCREEN 10 W/CONF, SERUM
Amphetamines, IA: NEGATIVE ng/mL
Barbiturates, IA: NEGATIVE ug/mL
Benzodiazepines, IA: NEGATIVE ng/mL
Cocaine & Metabolite, IA: NEGATIVE ng/mL
Methadone, IA: NEGATIVE ng/mL
Opiates, IA: NEGATIVE ng/mL
Oxycodones, IA: NEGATIVE ng/mL
Phencyclidine, IA: NEGATIVE ng/mL
Propoxyphene, IA: NEGATIVE ng/mL
THC(Marijuana) Metabolite, IA: NEGATIVE ng/mL

## 2021-07-07 ENCOUNTER — Ambulatory Visit (INDEPENDENT_AMBULATORY_CARE_PROVIDER_SITE_OTHER): Payer: Medicare Other

## 2021-07-07 ENCOUNTER — Other Ambulatory Visit: Payer: Self-pay

## 2021-07-07 VITALS — Ht 64.0 in | Wt 208.0 lb

## 2021-07-07 DIAGNOSIS — I152 Hypertension secondary to endocrine disorders: Secondary | ICD-10-CM

## 2021-07-07 DIAGNOSIS — Z794 Long term (current) use of insulin: Secondary | ICD-10-CM | POA: Diagnosis not present

## 2021-07-07 DIAGNOSIS — E1159 Type 2 diabetes mellitus with other circulatory complications: Secondary | ICD-10-CM | POA: Diagnosis not present

## 2021-07-07 DIAGNOSIS — Z0001 Encounter for general adult medical examination with abnormal findings: Secondary | ICD-10-CM | POA: Diagnosis not present

## 2021-07-07 DIAGNOSIS — E1169 Type 2 diabetes mellitus with other specified complication: Secondary | ICD-10-CM | POA: Diagnosis not present

## 2021-07-07 DIAGNOSIS — E785 Hyperlipidemia, unspecified: Secondary | ICD-10-CM

## 2021-07-07 DIAGNOSIS — Z0000 Encounter for general adult medical examination without abnormal findings: Secondary | ICD-10-CM

## 2021-07-07 NOTE — Addendum Note (Signed)
Addended by: Adalberto Cole E on: 07/07/2021 02:44 PM   Modules accepted: Orders

## 2021-07-07 NOTE — Patient Instructions (Addendum)
Diana Pratt , Thank you for taking time to come for your Medicare Wellness Visit. I appreciate your ongoing commitment to your health goals. Please review the following plan we discussed and let me know if I can assist you in the future.   Screening recommendations/referrals: Colonoscopy: Done 07/19/2014 - Repeat in 10 years Mammogram: Declined (we still recommend this every 1-2 years) Bone Density: Done 06/05/2017 - Repeat every 2 years *due at next visit Recommended yearly ophthalmology/optometry visit for glaucoma screening and checkup Recommended yearly dental visit for hygiene and checkup  Vaccinations: Influenza vaccine: Declined (recommended every fall) Pneumococcal vaccine: 06/30/2018 & 09/21/2019 Tdap vaccine: Declined (recommended every 10 years) Shingles vaccine: Due. Shingrix discussed. Please contact your pharmacy for coverage information if you change your mind.   Covid-19: Declined  Advanced directives: Please bring a copy of your health care power of attorney and living will to the office to be added to your chart at your convenience.   Conditions/risks identified: Aim for 30 minutes of exercise or brisk walking each day, drink 6-8 glasses of water and eat lots of fruits and vegetables.   Next appointment: Follow up in one year for your annual wellness visit    Preventive Care 65 Years and Older, Female Preventive care refers to lifestyle choices and visits with your health care provider that can promote health and wellness. What does preventive care include? A yearly physical exam. This is also called an annual well check. Dental exams once or twice a year. Routine eye exams. Ask your health care provider how often you should have your eyes checked. Personal lifestyle choices, including: Daily care of your teeth and gums. Regular physical activity. Eating a healthy diet. Avoiding tobacco and drug use. Limiting alcohol use. Practicing safe sex. Taking low-dose aspirin  every day. Taking vitamin and mineral supplements as recommended by your health care provider. What happens during an annual well check? The services and screenings done by your health care provider during your annual well check will depend on your age, overall health, lifestyle risk factors, and family history of disease. Counseling  Your health care provider may ask you questions about your: Alcohol use. Tobacco use. Drug use. Emotional well-being. Home and relationship well-being. Sexual activity. Eating habits. History of falls. Memory and ability to understand (cognition). Work and work Statistician. Reproductive health. Screening  You may have the following tests or measurements: Height, weight, and BMI. Blood pressure. Lipid and cholesterol levels. These may be checked every 5 years, or more frequently if you are over 42 years old. Skin check. Lung cancer screening. You may have this screening every year starting at age 45 if you have a 30-pack-year history of smoking and currently smoke or have quit within the past 15 years. Fecal occult blood test (FOBT) of the stool. You may have this test every year starting at age 61. Flexible sigmoidoscopy or colonoscopy. You may have a sigmoidoscopy every 5 years or a colonoscopy every 10 years starting at age 67. Hepatitis C blood test. Hepatitis B blood test. Sexually transmitted disease (STD) testing. Diabetes screening. This is done by checking your blood sugar (glucose) after you have not eaten for a while (fasting). You may have this done every 1-3 years. Bone density scan. This is done to screen for osteoporosis. You may have this done starting at age 54. Mammogram. This may be done every 1-2 years. Talk to your health care provider about how often you should have regular mammograms. Talk with your health  care provider about your test results, treatment options, and if necessary, the need for more tests. Vaccines  Your health  care provider may recommend certain vaccines, such as: Influenza vaccine. This is recommended every year. Tetanus, diphtheria, and acellular pertussis (Tdap, Td) vaccine. You may need a Td booster every 10 years. Zoster vaccine. You may need this after age 45. Pneumococcal 13-valent conjugate (PCV13) vaccine. One dose is recommended after age 23. Pneumococcal polysaccharide (PPSV23) vaccine. One dose is recommended after age 60. Talk to your health care provider about which screenings and vaccines you need and how often you need them. This information is not intended to replace advice given to you by your health care provider. Make sure you discuss any questions you have with your health care provider. Document Released: 11/18/2015 Document Revised: 07/11/2016 Document Reviewed: 08/23/2015 Elsevier Interactive Patient Education  2017 [place] Prevention in the Home Falls can cause injuries. They can happen to people of all ages. There are many things you can do to make your home safe and to help prevent falls. What can I do on the outside of my home? Regularly fix the edges of walkways and driveways and fix any cracks. Remove anything that might make you trip as you walk through a door, such as a raised step or threshold. Trim any bushes or trees on the path to your home. Use bright outdoor lighting. Clear any walking paths of anything that might make someone trip, such as rocks or tools. Regularly check to see if handrails are loose or broken. Make sure that both sides of any steps have handrails. Any raised decks and porches should have guardrails on the edges. Have any leaves, snow, or ice cleared regularly. Use sand or salt on walking paths during winter. Clean up any spills in your garage right away. This includes oil or grease spills. What can I do in the bathroom? Use night lights. Install grab bars by the toilet and in the tub and shower. Do not use towel bars as grab  bars. Use non-skid mats or decals in the tub or shower. If you need to sit down in the shower, use a plastic, non-slip stool. Keep the floor dry. Clean up any water that spills on the floor as soon as it happens. Remove soap buildup in the tub or shower regularly. Attach bath mats securely with double-sided non-slip rug tape. Do not have throw rugs and other things on the floor that can make you trip. What can I do in the bedroom? Use night lights. Make sure that you have a light by your bed that is easy to reach. Do not use any sheets or blankets that are too big for your bed. They should not hang down onto the floor. Have a firm chair that has side arms. You can use this for support while you get dressed. Do not have throw rugs and other things on the floor that can make you trip. What can I do in the kitchen? Clean up any spills right away. Avoid walking on wet floors. Keep items that you use a lot in easy-to-reach places. If you need to reach something above you, use a strong step stool that has a grab bar. Keep electrical cords out of the way. Do not use floor polish or wax that makes floors slippery. If you must use wax, use non-skid floor wax. Do not have throw rugs and other things on the floor that can make you trip. What can  I do with my stairs? Do not leave any items on the stairs. Make sure that there are handrails on both sides of the stairs and use them. Fix handrails that are broken or loose. Make sure that handrails are as long as the stairways. Check any carpeting to make sure that it is firmly attached to the stairs. Fix any carpet that is loose or worn. Avoid having throw rugs at the top or bottom of the stairs. If you do have throw rugs, attach them to the floor with carpet tape. Make sure that you have a light switch at the top of the stairs and the bottom of the stairs. If you do not have them, ask someone to add them for you. What else can I do to help prevent  falls? Wear shoes that: Do not have high heels. Have rubber bottoms. Are comfortable and fit you well. Are closed at the toe. Do not wear sandals. If you use a stepladder: Make sure that it is fully opened. Do not climb a closed stepladder. Make sure that both sides of the stepladder are locked into place. Ask someone to hold it for you, if possible. Clearly mark and make sure that you can see: Any grab bars or handrails. First and last steps. Where the edge of each step is. Use tools that help you move around (mobility aids) if they are needed. These include: Canes. Walkers. Scooters. Crutches. Turn on the lights when you go into a dark area. Replace any light bulbs as soon as they burn out. Set up your furniture so you have a clear path. Avoid moving your furniture around. If any of your floors are uneven, fix them. If there are any pets around you, be aware of where they are. Review your medicines with your doctor. Some medicines can make you feel dizzy. This can increase your chance of falling. Ask your doctor what other things that you can do to help prevent falls. This information is not intended to replace advice given to you by your health care provider. Make sure you discuss any questions you have with your health care provider. Document Released: 08/18/2009 Document Revised: 03/29/2016 Document Reviewed: 11/26/2014 Elsevier Interactive Patient Education  2017 [employer].

## 2021-07-07 NOTE — Telephone Encounter (Signed)
We do not refill antibiotics/ antifungals without OV.  Recommend obtaining Monistat.  Likely needs eval given recurrence.

## 2021-07-07 NOTE — Telephone Encounter (Signed)
She has another yeast infection - itching and burning - Says these are common for her. She recently took antibiotics and had diflucan after, but sx just started this week. Wants to know if you can refill?

## 2021-07-07 NOTE — Progress Notes (Signed)
Subjective:   Diana Pratt is a 75 y.o. female who presents for Medicare Annual (Subsequent) preventive examination.  Virtual Visit via Telephone Note  I connected with  Diana Pratt on 07/07/21 at  2:00 PM EDT by telephone and verified that I am speaking with the correct person using two identifiers.  Location: Patient: Home Provider: [facility] Persons participating in the virtual visit: patient/Nurse Health Advisor   I discussed the limitations, risks, security and privacy concerns of performing an evaluation and management service by telephone and the availability of in person appointments. The patient expressed understanding and agreed to proceed.  Interactive audio and video telecommunications were attempted between this nurse and patient, however failed, due to patient having technical difficulties OR patient did not have access to video capability.  We continued and completed visit with audio only.  Some vital signs may be absent or patient reported.   Radin Raptis E Tayshon Winker, LPN   Review of Systems     Cardiac Risk Factors include: advanced age (>7mn, >>1women);diabetes mellitus;dyslipidemia;hypertension;obesity (BMI >30kg/m2);sedentary lifestyle;Other (see comment), Risk factor comments: COPD     Objective:    Today's Vitals   07/07/21 1402 07/07/21 1403  Weight: 208 lb (94.3 kg)   Height: '5\' 4"'  (1.626 m)   PainSc:  5    Body mass index is 35.7 kg/m.  Advanced Directives 07/07/2021 07/06/2020 09/03/2019 07/06/2019 06/05/2017 04/10/2017 02/11/2017  Does Patient Have a Medical Advance Directive? No No No No No No No  Would patient like information on creating a medical advance directive? No - Patient declined No - Patient declined - No - Patient declined Yes (MAU/Ambulatory/Procedural Areas - Information given) Yes (MAU/Ambulatory/Procedural Areas - Information given) No - Patient declined    Current Medications (verified) Outpatient Encounter Medications as of 07/07/2021   Medication Sig   ACCU-CHEK AVIVA PLUS test strip Use to check BG up to tid.  Dx: type 2 diabetes requiring insulin therapy E11.40   Accu-Chek Softclix Lancets lancets Check blood sugar 3 times daily Dx E11.40   albuterol (PROAIR HFA) 108 (90 Base) MCG/ACT inhaler Inhale 2 puffs into the lungs every 6 (six) hours as needed for shortness of breath.   amLODipine (NORVASC) 10 MG tablet Take 1 tablet (10 mg total) by mouth daily.   aspirin 81 MG EC tablet Take 81 mg by mouth daily.   Blood Glucose Monitoring Suppl (ACCU-CHEK AVIVA PLUS) w/Device KIT Check blood glucose TID Dx E11.40   budesonide-formoterol (SYMBICORT) 160-4.5 MCG/ACT inhaler Inhale 2 puffs into the lungs 2 (two) times daily.   calcium carbonate 200 MG capsule Take 250 mg by mouth daily.   Cholecalciferol (VITAMIN D3) 2000 UNITS capsule Take 2,000 Units by mouth daily.    conjugated estrogens (PREMARIN) vaginal cream Apply 0.5gm vaginally 3 days per week.   FLUoxetine (PROZAC) 20 MG capsule Take 1 capsule (20 mg total) by mouth daily.   fluticasone (FLONASE) 50 MCG/ACT nasal spray Place 2 sprays into the nose daily as needed for allergies. Congestion    hydrochlorothiazide (HYDRODIURIL) 25 MG tablet Take 1 tablet (25 mg total) by mouth daily.   hydroxypropyl methylcellulose (ISOPTO TEARS) 2.5 % ophthalmic solution Place 1 drop into both eyes daily as needed. for dry eyes   ibuprofen (ADVIL,MOTRIN) 200 MG tablet Take 800 mg by mouth as needed.   insulin glargine (LANTUS SOLOSTAR) 100 UNIT/ML Solostar Pen Inject 45 Units into the skin 2 (two) times daily.   Insulin Pen Needle (PEN NEEDLES) 31G X 6  MM MISC Use to administer insulin once qid. Dx E11.59 Use Leader brand   LORazepam (ATIVAN) 1 MG tablet Take 1/2-1 tablet daily if needed for anxiety.  May repeat dose once if needed during the day. PUT ON FILE   metoprolol succinate (TOPROL-XL) 100 MG 24 hr tablet Take 1 tablet (100 mg total) by mouth daily. Take with or immediately  following a meal.   Multiple Vitamins-Minerals (CENTRUM SILVER PO) Take 1 tablet by mouth daily.   omeprazole (PRILOSEC) 20 MG capsule Take 1 capsule (20 mg total) by mouth daily.   Semaglutide, 1 MG/DOSE, (OZEMPIC, 1 MG/DOSE,) 2 MG/1.5ML SOPN Inject 1 mg into the skin once a week.   tiZANidine (ZANAFLEX) 4 MG tablet Take 0.5-1 tablets (2-4 mg total) by mouth every 8 (eight) hours as needed for muscle spasms.   valsartan (DIOVAN) 320 MG tablet Take 1 tablet (320 mg total) by mouth daily.   No facility-administered encounter medications on file as of 07/07/2021.    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 Kenn File   Depression    Diabetes mellitus    Diabetic neuropathy ([place])    GERD (gastroesophageal reflux disease)    occ heartburn   Humerus fracture    right   Hyperlipemia    Hypertension    Interstitial cystitis    Osteoporosis    Pneumonia    15 yrs ago   PONV (postoperative nausea and vomiting)    Shortness of breath dyspnea    Past Surgical History:  Procedure Laterality Date   ABDOMINAL HYSTERECTOMY  03/1984   partial   COLONOSCOPY N/A 07/19/2014   Procedure: COLONOSCOPY;  Surgeon: Danie Binder, MD;  [facility];  Service: Endoscopy;  Laterality: N/A;  9:30   REVERSE SHOULDER ARTHROPLASTY Right 06/14/2016   Procedure: REVERSE SHOULDER ARTHROPLASTY;  Surgeon: Justice Britain, MD;  Location: [facility];  Service: Orthopedics;  Laterality: Right;   Skin cancer removed  08/07/2016   left ear   Family History  Problem Relation Age of Onset   Parkinsonism Mother    Dementia Mother    Colon cancer Father 65        MULTIPLE CO-MORBIDITIES   Cancer Father    Diabetes Paternal Aunt    Diabetes Paternal Uncle    Diabetes Paternal Grandmother    Congestive Heart Failure Paternal Grandfather    Congestive Heart Failure Maternal Grandmother    Stroke Maternal Grandfather    Colon polyps Neg Hx    Social History   Socioeconomic History   Marital status: Married    Spouse name: [name]   Number of children: 1   Years of education: 13   Highest education level: Some college, no degree  Occupational History   Occupation: retired     Comment: CNA   Tobacco Use   Smoking status: Former    Packs/day: 0.50    Years: 34.00    Pack years: 17.00    Types: Cigarettes    Quit date: 06/01/1994    Years since quitting: 27.1   Smokeless tobacco: Never  Vaping Use   Vaping Use: Never used  Substance  and Sexual Activity   Alcohol use: No   Drug use: No   Sexual activity: Yes    Birth control/protection: Surgical    Comment: hyst  Other Topics Concern   Not on file  Social History Narrative   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 [place]   Social Determinants of Health   Financial Resource Strain: Low Risk    Difficulty of Paying Living Expenses: Not very hard  Food Insecurity: No Food Insecurity   Worried About Charity fundraiser in the Last Year: Never true   Ran Out of Food in the Last Year: Never true  Transportation Needs: No Transportation Needs   Lack of Transportation (Medical): No   Lack of Transportation (Non-Medical): No  Physical Activity: Insufficiently Active   Days of Exercise per Week: 5 days   Minutes of Exercise per Session: 20 min  Stress: No Stress Concern Present   Feeling of Stress : Only a little  Social Connections: Moderately Integrated   Frequency of Communication with Friends and Family: More than three times a week   Frequency of Social Gatherings with Friends and Family: More than three times a week   Attends  Religious Services: 1 to 4 times per year   Active Member of [employer] or Organizations: No   Attends Music therapist: Never   Marital Status: Married    Tobacco Counseling Counseling given: Not Answered   Clinical Intake:  Pre-visit preparation completed: Yes  Pain : 0-10 Pain Score: 5  Pain Type: Acute pain Pain Location: Vagina Pain Descriptors / Indicators: Discomfort, Sore Pain Onset: In the past 7 days Pain Frequency: Intermittent     BMI - recorded: 35.7 Nutritional Status: BMI > 30  Obese Nutritional Risks: None Diabetes: Yes CBG done?: No Did pt. bring in CBG monitor from home?: No  How often do you need to have someone help you when you read instructions, pamphlets, or other written materials from your doctor or pharmacy?: 1 - Never  Diabetic? Nutrition Risk Assessment:  Has the patient had any N/V/D within the last 2 months?  No  Does the patient have any non-healing wounds?  No  Has the patient had any unintentional weight loss or weight gain?  No   Diabetes:  Is the patient diabetic?  Yes  If diabetic, was a CBG obtained today?  No  Did the patient bring in their glucometer from home?  No  How often do you monitor your CBG's? TID.   Financial Strains and Diabetes Management:  Are you having any financial strains with the device, your supplies or your medication? No .  Does the patient want to be seen by Chronic Care Management for management of their diabetes?   She is already speaking with CCM Would the patient like to be referred to a Nutritionist or for Diabetic Management?  Yes   Diabetic Exams:  Diabetic Eye Exam: Completed 05/29/2021.  Diabetic Foot Exam: Completed 07/26/2020. Pt has been advised about the importance in completing this exam. Pt is scheduled for diabetic foot exam on 09/26/2021.    Interpreter Needed?: No  Information entered by :: Azula Zappia, LPN   Activities of Daily Living In your present state of health,  do you have any difficulty performing the following activities: 07/07/2021  Hearing? N  Vision? N  Difficulty concentrating or making decisions? N  Walking or climbing stairs? N  Dressing or bathing? N  Doing errands,  shopping? N  Preparing Food and eating ? N  Using the Toilet? N  In the past six months, have you accidently leaked urine? Y  Comment wears depends because sometimes she can't make it in time  Do you have problems with loss of bowel control? N  Managing your Medications? N  Managing your Finances? N  Housekeeping or managing your Housekeeping? N  Some recent data might be hidden    Patient Care Team: Janora Norlander, DO as PCP - General (Family Medicine) Danie Binder, MD (Inactive) as Consulting Physician (Gastroenterology) Druscilla Brownie, MD as Consulting Physician (Dermatology) Justice Britain, MD as Consulting Physician (Orthopedic Surgery) Estill Dooms, NP as Nurse Practitioner (Obstetrics and Gynecology) Ilean China, RN as Case Manager [place], Royce Macadamia, [facility] (Pharmacist)  Indicate any recent Medical Services you may have received from other than [facility] providers in the past year (date may be approximate).     Assessment:   This is a routine wellness examination for [place].  Hearing/Vision screen Hearing Screening - Comments:: Denies hearing difficulties  Vision Screening - Comments:: Wears glasses - up to date with annual eye exams with Dr Marin Comment  Dietary issues and exercise activities discussed: Current Exercise Habits: Home exercise routine, Type of exercise: walking, Time (Minutes): 20, Frequency (Times/Week): 5, Weekly Exercise (Minutes/Week): 100, Exercise limited by: respiratory conditions(s);orthopedic condition(s)   Goals Addressed             This Visit's Progress    Prevent falls   On track    Stay active Work on eating a better diet.        Depression Screen PHQ 2/9 Scores 07/07/2021 05/25/2021 01/23/2021 10/25/2020 10/03/2020  07/26/2020 07/06/2020  PHQ - 2 Score 0 0 0 0 0 0 0  PHQ- 9 Score '2 2 3 ' - 0 0 -    Fall Risk Fall Risk  07/07/2021 05/25/2021 01/23/2021 10/25/2020 10/03/2020  Falls in the past year? - 0 0 0 0  Number falls in past yr: 0 - - - -  Injury with Fall? 0 - - - -  Risk for fall due to : Orthopedic patient;Impaired vision - - - -  Follow up Falls prevention discussed - - - -    FALL RISK PREVENTION PERTAINING TO THE HOME:  Any stairs in or around the home? Yes  If so, are there any without handrails? No  Home free of loose throw rugs in walkways, pet beds, electrical cords, etc? Yes  Adequate lighting in your home to reduce risk of falls? Yes   ASSISTIVE DEVICES UTILIZED TO PREVENT FALLS:  Life alert? No  Use of a cane, walker or w/c? No  Grab bars in the bathroom? Yes  Shower chair or bench in shower? Yes  Elevated toilet seat or a handicapped toilet? Yes   TIMED UP AND GO:  Was the test performed? No . Telephonic visit  Cognitive Function: Normal cognitive status assessed by direct observation by this Nurse Health Advisor. No abnormalities found.   MMSE - Mini Mental State Exam 05/31/2016  Orientation to time 5  Orientation to Place 5  Registration 3  Attention/ Calculation 5  Recall 3  Language- name 2 objects 2  Language- repeat 1  Language- follow 3 step command 3  Language- read & follow direction 1  Write a sentence 1  Copy design 1  Total score 30     6CIT Screen 07/06/2020 07/06/2019  What Year? 0 points 0 points  What  month? 0 points 0 points  What time? 0 points 0 points  Count back from 20 2 points 0 points  Months in reverse 0 points 0 points  Repeat phrase 0 points 2 points  Total Score 2 2    Immunizations Immunization History  Administered Date(s) Administered   DTaP 01/04/2003   Pneumococcal Conjugate-13 09/21/2019   Pneumococcal Polysaccharide-23 06/30/2018    TDAP status: Due, Education has been provided regarding the importance of this vaccine.  Advised may receive this vaccine at local pharmacy or Health Dept. Aware to provide a copy of the vaccination record if obtained from local pharmacy or Health Dept. Verbalized acceptance and understanding.  Flu Vaccine status: Declined, Education has been provided regarding the importance of this vaccine but patient still declined. Advised may receive this vaccine at local pharmacy or Health Dept. Aware to provide a copy of the vaccination record if obtained from local pharmacy or Health Dept. Verbalized acceptance and understanding.  Pneumococcal vaccine status: Up to date  Covid-19 vaccine status: Declined, Education has been provided regarding the importance of this vaccine but patient still declined. Advised may receive this vaccine at local pharmacy or Health Dept.or vaccine clinic. Aware to provide a copy of the vaccination record if obtained from local pharmacy or Health Dept. Verbalized acceptance and understanding.  Qualifies for Shingles Vaccine? Yes   Zostavax completed No   Shingrix Completed?: No.    Education has been provided regarding the importance of this vaccine. Patient has been advised to call insurance company to determine out of pocket expense if they have not yet received this vaccine. Advised may also receive vaccine at local pharmacy or Health Dept. Verbalized acceptance and understanding.  Screening Tests Health Maintenance  Topic Date Due   COVID-19 Vaccine (1) Never done   OPHTHALMOLOGY EXAM  12/23/2018   DEXA SCAN  06/06/2019   INFLUENZA VACCINE  Never done   Zoster Vaccines- Shingrix (1 of 2) 08/25/2021 (Originally 03/05/1965)   TETANUS/TDAP  01/23/2022 (Originally 01/03/2013)   FOOT EXAM  07/26/2021   HEMOGLOBIN A1C  11/25/2021   COLONOSCOPY (Pts 45-73yr Insurance coverage will need to be confirmed)  07/19/2024   Hepatitis C Screening  Completed   PNA vac Low Risk Adult  Completed   HPV VACCINES  Aged Out    Health Maintenance  Health Maintenance Due   Topic Date Due   COVID-19 Vaccine (1) Never done   OPHTHALMOLOGY EXAM  12/23/2018   DEXA SCAN  06/06/2019   INFLUENZA VACCINE  Never done    Colorectal cancer screening: No longer required.   Mammogram status: No longer required due to age - and she declines.  Bone Density status: Completed 06/05/2017. Results reflect: Bone density results: OSTEOPENIA. Repeat every 2 years.  Lung Cancer Screening: (Low Dose CT Chest recommended if Age 75-80years, 30 pack-year currently smoking OR have quit w/in 15years.) does not qualify.  Additional Screening:  Hepatitis C Screening: does qualify; Completed 07/05/2014  Vision Screening: Recommended annual ophthalmology exams for early detection of glaucoma and other disorders of the eye. Is the patient up to date with their annual eye exam?  Yes  Who is the provider or what is the name of the office in which the patient attends annual eye exams? Dr LMarin CommentIf pt is not established with a provider, would they like to be referred to a provider to establish care? No .   Dental Screening: Recommended annual dental exams for proper oral hygiene  Community Resource Referral /  Chronic Care Management: CRR required this visit?  No   CCM required this visit?  No      Plan:     I have personally reviewed and noted the following in the patient's chart:   Medical and social history Use of alcohol, tobacco or illicit drugs  Current medications and supplements including opioid prescriptions.  Functional ability and status Nutritional status Physical activity Advanced directives List of other physicians Hospitalizations, surgeries, and ER visits in previous 12 months Vitals Screenings to include cognitive, depression, and falls Referrals and appointments  In addition, I have reviewed and discussed with patient certain preventive protocols, quality metrics, and best practice recommendations. A written personalized care plan for preventive services as  well as general preventive health recommendations were provided to patient.     Sandrea Hammond, LPN   11/10/9448   Nurse Notes: Feels that she has a yeast infection (gets these often) I sent a note to provider for refill on Diflucan.  Also, she is having a very hard time sleeping. Wants to discuss increasing her Prozac. Has been on the same dose for years. I advised to try melatonin first.

## 2021-07-12 ENCOUNTER — Telehealth: Payer: Self-pay

## 2021-07-12 NOTE — Chronic Care Management (AMB) (Signed)
  Chronic Care Management   Note  07/12/2021 Name: Diana Pratt MRN: [id number] DOB: Jan 09, 1946  Diana Pratt is a 75 y.o. year old female who is a primary care patient of Janora Norlander, DO. Diana Pratt is currently enrolled in care management services. An additional referral for Pharm D  was placed.   Follow up plan: Telephone appointment with care management team member scheduled for:08/03/2021  Noreene Larsson, [place], [place], [place] [address] Direct Dial: [phone] [email or web address] Website: [email or web address]

## 2021-07-19 ENCOUNTER — Other Ambulatory Visit: Payer: Self-pay | Admitting: Family Medicine

## 2021-08-03 ENCOUNTER — Ambulatory Visit: Payer: Medicare Other | Admitting: Pharmacist

## 2021-08-03 NOTE — Progress Notes (Deleted)
OZEMPIC 1MG  LANTUS 45U BID A1C 6.9 GFR 72

## 2021-08-11 NOTE — Progress Notes (Signed)
  Care Management   Follow Up Note   08/03/2021 Name: Diana Pratt MRN: [id number] DOB: 12-30-1945   Referred by: Janora Norlander, DO Reason for referral : Chronic Care Management and Diabetes   An unsuccessful telephone outreach was attempted today. The patient was referred to the case management team for assistance with care management and care coordination.   Follow Up Plan: Telephone follow up appointment with care management team member scheduled for: 47month    Regina Eck, PharmD, BCPS Clinical Pharmacist, [place]  II Phone [phone]

## 2021-09-21 ENCOUNTER — Ambulatory Visit: Payer: Medicare Other | Admitting: Pharmacist

## 2021-09-21 DIAGNOSIS — G720 Drug-induced myopathy: Secondary | ICD-10-CM

## 2021-09-21 DIAGNOSIS — E1169 Type 2 diabetes mellitus with other specified complication: Secondary | ICD-10-CM

## 2021-09-21 DIAGNOSIS — Z794 Long term (current) use of insulin: Secondary | ICD-10-CM

## 2021-09-25 ENCOUNTER — Other Ambulatory Visit: Payer: Self-pay

## 2021-09-25 ENCOUNTER — Encounter: Payer: Self-pay | Admitting: Family Medicine

## 2021-09-25 ENCOUNTER — Ambulatory Visit (INDEPENDENT_AMBULATORY_CARE_PROVIDER_SITE_OTHER): Payer: Medicare Other | Admitting: Family Medicine

## 2021-09-25 VITALS — BP 134/52 | HR 85 | Temp 98.0°F | Resp 20 | Ht 64.0 in | Wt 209.0 lb

## 2021-09-25 DIAGNOSIS — E785 Hyperlipidemia, unspecified: Secondary | ICD-10-CM

## 2021-09-25 DIAGNOSIS — Z79899 Other long term (current) drug therapy: Secondary | ICD-10-CM

## 2021-09-25 DIAGNOSIS — E1169 Type 2 diabetes mellitus with other specified complication: Secondary | ICD-10-CM

## 2021-09-25 DIAGNOSIS — F418 Other specified anxiety disorders: Secondary | ICD-10-CM | POA: Diagnosis not present

## 2021-09-25 DIAGNOSIS — R300 Dysuria: Secondary | ICD-10-CM

## 2021-09-25 DIAGNOSIS — E1159 Type 2 diabetes mellitus with other circulatory complications: Secondary | ICD-10-CM

## 2021-09-25 DIAGNOSIS — I152 Hypertension secondary to endocrine disorders: Secondary | ICD-10-CM

## 2021-09-25 DIAGNOSIS — Z794 Long term (current) use of insulin: Secondary | ICD-10-CM

## 2021-09-25 DIAGNOSIS — N952 Postmenopausal atrophic vaginitis: Secondary | ICD-10-CM

## 2021-09-25 LAB — MICROSCOPIC EXAMINATION
RBC, Urine: NONE SEEN /hpf (ref 0–2)
Renal Epithel, UA: NONE SEEN /hpf

## 2021-09-25 LAB — URINALYSIS, ROUTINE W REFLEX MICROSCOPIC
Bilirubin, UA: NEGATIVE
Glucose, UA: NEGATIVE
Ketones, UA: NEGATIVE
Leukocytes,UA: NEGATIVE
Nitrite, UA: NEGATIVE
Protein,UA: NEGATIVE
RBC, UA: NEGATIVE
Specific Gravity, UA: 1.01 (ref 1.005–1.030)
Urobilinogen, Ur: 0.2 mg/dL (ref 0.2–1.0)
pH, UA: 5 (ref 5.0–7.5)

## 2021-09-25 LAB — BAYER DCA HB A1C WAIVED: HB A1C (BAYER DCA - WAIVED): 6.5 % — ABNORMAL HIGH (ref 4.8–5.6)

## 2021-09-25 MED ORDER — CEPHALEXIN 500 MG PO CAPS: 500.0000 mg | ORAL_CAPSULE | Freq: Two times a day (BID) | ORAL | 0 refills | Status: AC

## 2021-09-25 MED ORDER — FLUCONAZOLE 150 MG PO TABS: 150.0000 mg | ORAL_TABLET | Freq: Once | ORAL | 0 refills | Status: AC

## 2021-09-25 MED ORDER — LORAZEPAM 1 MG PO TABS: ORAL_TABLET | ORAL | 3 refills | Status: DC

## 2021-09-25 MED ORDER — LANTUS SOLOSTAR 100 UNIT/ML ~~LOC~~ SOPN: 45.0000 [IU] | PEN_INJECTOR | Freq: Two times a day (BID) | SUBCUTANEOUS | 2 refills | Status: DC

## 2021-09-25 MED ORDER — PREMARIN 0.625 MG/GM VA CREA: TOPICAL_CREAM | VAGINAL | 2 refills | Status: DC

## 2021-09-25 MED ORDER — OZEMPIC (1 MG/DOSE) 4 MG/3ML ~~LOC~~ SOPN: PEN_INJECTOR | SUBCUTANEOUS | 1 refills | Status: DC

## 2021-09-25 NOTE — Patient Instructions (Signed)
Your scores aren't that high but if you decide anxiety is becoming worse, let me know and I can increase the Fluoxetine.  I want you to start walking daily.  We talked about going [place] more [place] walk so that you rest better at night.  You CAN take Melatonin if you want for sleep but NO MORE THAN 10mg  per day.  Alpha lipoic acid 600mg  daily for diabetic nerve pain is helpful.  You can get this from your pharmacist.  Sugar looks great.  No changes needed.  Refills sent.

## 2021-09-25 NOTE — Progress Notes (Signed)
Subjective: CC:DM PCP: Janora Norlander, DO Diana Pratt is a 75 y.o. female presenting to clinic today for:  1. Type 2 Diabetes with hypertension, hyperlipidemia:  Reports compliance with her medication.  No hypoglycemic episodes reported.  She saw Dr. Truman Hayward for her eye exam recently.  She is worried about her kidneys because she is on valsartan and asked this to be checked today.  She admits to dysuria and vaginal dryness.  She is on Premarin vaginally 3xweek  Last eye exam: done with Dr Marin Comment Last foot exam: needs Last A1c:  Lab Results  Component Value Date   HGBA1C 6.9 05/25/2021   Nephropathy screen indicated?: UTD Last flu, zoster and/or pneumovax:  Immunization History  Administered Date(s) Administered   DTaP 01/04/2003   Pneumococcal Conjugate-13 09/21/2019   Pneumococcal Polysaccharide-23 06/30/2018    ROS: No chest pain, shortness of breath, visual disturbance reported.  She denies any foot ulcers  2. Anxiety disorder She reports that she has had some sleep issues, particularly staying asleep.  She wakes up in the middle the night roughly about 2 to 3 hours after going to sleep.  She has been avoiding caffeine and only drinks decaf coffee.  She denies needing to wake up to urinate.  She does take Ativan daily.  She is also on Prozac 20 mg daily.  Does not necessarily feel like she is having panic disorder in the middle the night.  She admits that she does not exercise regularly and may not be expending the energy that she should be.  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 Kenn File   Depression    Diabetes mellitus    Diabetic neuropathy ([place])    GERD (gastroesophageal reflux disease)    occ heartburn   Humerus fracture    right   Hyperlipemia    Hypertension    Interstitial cystitis    Osteoporosis    Pneumonia    15 yrs ago   PONV (postoperative nausea and vomiting)    Shortness of breath dyspnea     Current Outpatient Medications:    ACCU-CHEK AVIVA PLUS test strip, Use to check BG up to tid.  Dx: type 2 diabetes requiring insulin therapy E11.40, Disp: 300 each, Rfl: 3   Accu-Chek Softclix Lancets lancets, Check blood sugar 3 times daily Dx E11.40, Disp: 300 each, Rfl: 3   albuterol (PROAIR HFA) 108 (90 Base) MCG/ACT inhaler, Inhale 2 puffs into the lungs every 6 (six) hours as needed for shortness of breath., Disp: 54 g, Rfl: 2   amLODipine (NORVASC) 10 MG tablet, Take 1 tablet (10 mg total) by mouth daily., Disp: 90 tablet, Rfl: 2   aspirin 81 MG EC tablet, Take 81 mg by mouth daily., Disp: , Rfl:  Blood Glucose Monitoring Suppl (ACCU-CHEK AVIVA PLUS) w/Device KIT, Check blood glucose TID Dx E11.40, Disp: 1 kit, Rfl: 0   budesonide-formoterol (SYMBICORT) 160-4.5 MCG/ACT inhaler, Inhale 2 puffs into the lungs 2 (two) times daily., Disp: 3 each, Rfl: 4   calcium carbonate 200 MG capsule, Take 250 mg by mouth daily., Disp: , Rfl:    Cholecalciferol (VITAMIN D3) 2000 UNITS capsule, Take 2,000 Units by mouth daily. , Disp: 60 capsule, Rfl: 11   conjugated estrogens (PREMARIN) vaginal cream, Apply 0.5gm vaginally 3 days per week., Disp: 90 g, Rfl: 2   fluconazole (DIFLUCAN) 150 MG tablet, Take 150 mg by mouth daily., Disp: , Rfl:    FLUoxetine (PROZAC) 20 MG capsule, Take 1 capsule (20 mg total) by mouth daily., Disp: 90 capsule, Rfl: 2   fluticasone  (FLONASE) 50 MCG/ACT nasal spray, Place 2 sprays into the nose daily as needed for allergies. Congestion , Disp: , Rfl:    hydrochlorothiazide (HYDRODIURIL) 25 MG tablet, Take 1 tablet (25 mg total) by mouth daily., Disp: 90 tablet, Rfl: 2   hydroxypropyl methylcellulose (ISOPTO TEARS) 2.5 % ophthalmic solution, Place 1 drop into both eyes daily as needed. for dry eyes, Disp: , Rfl:    ibuprofen (ADVIL,MOTRIN) 200 MG tablet, Take 800 mg by mouth as needed., Disp: , Rfl:    insulin glargine (LANTUS SOLOSTAR) 100 UNIT/ML Solostar Pen, Inject 45 Units into the skin 2 (two) times daily., Disp: 90 mL, Rfl: 2   Insulin Pen Needle (PEN NEEDLES) 31G X 6 MM MISC, Use to administer insulin once qid. Dx E11.59 Use Leader brand, Disp: 100 each, Rfl: 3   LORazepam (ATIVAN) 1 MG tablet, Take 1/2-1 tablet daily if needed for anxiety.  May repeat dose once if needed during the day. PUT ON FILE, Disp: 45 tablet, Rfl: 2   metoprolol succinate (TOPROL-XL) 100 MG 24 hr tablet, Take 1 tablet (100 mg total) by mouth daily. Take with or immediately following a meal., Disp: 90 tablet, Rfl: 2   Multiple Vitamins-Minerals (CENTRUM SILVER PO), Take 1 tablet by mouth daily., Disp: , Rfl:    omeprazole (PRILOSEC) 20 MG capsule, Take 1 capsule (20 mg total) by mouth daily., Disp: 30 capsule, Rfl: 0   OZEMPIC, 1 MG/DOSE, 4 MG/3ML SOPN, INJECT 1 MG SUBCUTANEOUSLY ONCE A WEEK - ADMINISTER BY SUBCUTANEOUS INJECTION INTO THE ABDOMEN, THIGH, OR UPPER ARM AT ANY TIME OF DAY ON THE SAME DAY EACH WEEK) DISCARD PEN 56 DAYS AFTER FIRST USE, Disp: 3 mL, Rfl: 1   tiZANidine (ZANAFLEX) 4 MG tablet, Take 0.5-1 tablets (2-4 mg total) by mouth every 8 (eight) hours as needed for muscle spasms., Disp: 30 tablet, Rfl: 0   valsartan (DIOVAN) 320 MG tablet, Take 1 tablet (320 mg total) by mouth daily., Disp: 90 tablet, Rfl: 2 Social History   Socioeconomic History   Marital status: Married    Spouse name: [name]   Number of children: 1   Years of  education: 13   Highest education level: Some college, no degree  Occupational History   Occupation: retired     Comment: CNA   Tobacco Use   Smoking status: Former    Packs/day: 0.50    Years: 34.00    Pack years: 17.00    Types: Cigarettes    Quit date: 06/01/1994    Years since quitting: 27.3   Smokeless tobacco: Never  Vaping Use   Vaping Use: Never used  Substance and Sexual Activity   Alcohol use:  No   Drug use: No   Sexual activity: Yes    Birth control/protection: Surgical    Comment: hyst  Other Topics Concern   Not on file  Social History Narrative   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 [place]   Social Determinants of Health   Financial Resource Strain: Low Risk    Difficulty of Paying Living Expenses: Not very hard  Food Insecurity: No Food Insecurity   Worried About Charity fundraiser in the Last Year: Never true   Ran Out of Food in the Last Year: Never true  Transportation Needs: No Transportation Needs   Lack of Transportation (Medical): No   Lack of Transportation (Non-Medical): No  Physical Activity: Insufficiently Active   Days of Exercise per Week: 5 days   Minutes of Exercise per Session: 20 min  Stress: No Stress Concern Present   Feeling of Stress : Only a little  Social Connections: Moderately Integrated   Frequency of Communication with Friends and Family: More than three times a week   Frequency of Social Gatherings with Friends and Family: More than three times a week   Attends Religious Services: 1 to 4 times per year   Active Member of [employer] or Organizations: No   Attends Music therapist: Never   Marital Status: Married  Human resources officer Violence: Not At Risk   Fear of Current or Ex-Partner: No   Emotionally Abused: No   Physically Abused: No   Sexually Abused: No   Family History  Problem Relation Age of Onset   Parkinsonism Mother    Dementia Mother    Colon  cancer Father 29       MULTIPLE CO-MORBIDITIES   Cancer Father    Diabetes Paternal Aunt    Diabetes Paternal Uncle    Diabetes Paternal Grandmother    Congestive Heart Failure Paternal Grandfather    Congestive Heart Failure Maternal Grandmother    Stroke Maternal Grandfather    Colon polyps Neg Hx     Objective: Office vital signs reviewed. BP (!) 134/52   Pulse 85   Temp 98 F (36.7 C)   Resp 20   Ht _0  (1.626 m)   Wt 209 lb (94.8 kg)   SpO2 96%   BMI 35.87 kg/m   Physical Examination:  General: Awake, alert, central obesity present, No acute distress HEENT: Normal, sclera white, MMM Cardio: regular rate and rhythm, S1S2 heard, no murmurs appreciated Pulm: clear to auscultation bilaterally, no wheezes, rhonchi or rales; normal work of breathing on room air Extremities: warm, well perfused, No edema, cyanosis or clubbing; +2 pulses bilaterally Neuro: see DM foot Psych: Mood is stable.  Patient is pleasant, interactive.  Affect somewhat flat  Depression screen [facility] 2/9 09/25/2021 07/07/2021 05/25/2021  Decreased Interest 0 0 0  Down, Depressed, Hopeless 1 0 0  PHQ - 2 Score 1 0 0  Altered sleeping _1 Tired, decreased energy _2 Change in appetite 0 0 0  Feeling bad or failure about yourself  0 0 0  Trouble concentrating 0 0 0  Moving slowly or fidgety/restless 0 0 0  Suicidal thoughts 0 0 0  PHQ-9 Score _3 Difficult doing work/chores Not difficult at all Not difficult at all Not difficult at all  Some recent data might be hidden   GAD 7 : Generalized Anxiety Score 09/25/2021 05/25/2021 01/23/2021 04/18/2020  Nervous, Anxious, on  Edge 1 0 1 1  Control/stop worrying 0 0 0 1  Worry too much - different things 0 0 0 1  Trouble relaxing 1 0 1 0  Restless 0 0 0 0  Easily annoyed or irritable 1 0 0 0  Afraid - awful might happen 0 0 0 0  Total GAD 7 Score 3 0 2 3  Anxiety Difficulty Not difficult at all Not difficult at all Somewhat difficult Not difficult  at all   Diabetic Foot Exam - Simple   Simple Foot Form Diabetic Foot exam was performed with the following findings: Yes 09/25/2021  2:09 PM  Visual Inspection See comments: Yes Sensation Testing Intact to touch and monofilament testing bilaterally: Yes Pulse Check Posterior Tibialis and Dorsalis pulse intact bilaterally: Yes Comments Onychomycotic changes to the nails bilaterally.  No ulcerations or preulcerative calluses.     Assessment/ Plan: 75 y.o. female   Type 2 diabetes mellitus with other specified complication, with long-term current use of insulin ([place]) - Plan: Bayer DCA Hb A1c Waived, insulin glargine (LANTUS SOLOSTAR) 100 UNIT/ML Solostar Pen, Semaglutide, 1 MG/DOSE, (OZEMPIC, 1 MG/DOSE,) 4 MG/3ML SOPN  Hypertension associated with diabetes ([place]) - Plan: Basic Metabolic Panel  Hyperlipidemia associated with type 2 diabetes mellitus ([place]) - Plan: LDL Cholesterol, Direct  Depression with anxiety - Plan: ToxASSURE Select 13 (MW), Urine, LORazepam (ATIVAN) 1 MG tablet  Chronic use of benzodiazepine for therapeutic purpose - Plan: ToxASSURE Select 13 (MW), Urine  Dysuria - Plan: Urinalysis, Routine w reflex microscopic, Urine Culture, cephALEXin (KEFLEX) 500 MG capsule, fluconazole (DIFLUCAN) 150 MG tablet  Vaginal atrophy - Plan: conjugated estrogens (PREMARIN) vaginal cream  Sugar is controlled.  No changes.  Blood pressure is controlled.  No changes.  Check renal function  Check direct LDL given nonfasting status.  Check UDS, CSC was also renewed.  Ativan renewed.  Continue Prozac at current dose.  I do not think that the level of anxiety score depressive score warrants increasing this, particularly given the possible interaction with her beta-blocker.  However, I did advise her to increase her physical activity both for mental health purposes but also to promote better sleep at night  Urinalysis did have bacteria and therefore I am going to place her on Keflex.   Diflucan sent.  Urine culture sent  Did not discuss vaginal atrophy in the [place] but I did renew her Premarin per request  No orders of the defined types were placed in this encounter.  No orders of the defined types were placed in this encounter.    Janora Norlander, DO [place] [phone]

## 2021-09-26 LAB — BASIC METABOLIC PANEL
BUN/Creatinine Ratio: 20 (ref 12–28)
BUN: 20 mg/dL (ref 8–27)
CO2: 19 mmol/L — ABNORMAL LOW (ref 20–29)
Calcium: 9.2 mg/dL (ref 8.7–10.3)
Chloride: 99 mmol/L (ref 96–106)
Creatinine, Ser: 1.01 mg/dL — ABNORMAL HIGH (ref 0.57–1.00)
Glucose: 256 mg/dL — ABNORMAL HIGH (ref 70–99)
Potassium: 4.1 mmol/L (ref 3.5–5.2)
Sodium: 141 mmol/L (ref 134–144)
eGFR: 58 mL/min/{1.73_m2} — ABNORMAL LOW (ref >59)

## 2021-09-26 LAB — LDL CHOLESTEROL, DIRECT: LDL Direct: 136 mg/dL — ABNORMAL HIGH (ref 0–99)

## 2021-09-27 NOTE — Progress Notes (Signed)
  Care Management   Follow Up Note   09/21/2021 Name: Diana Pratt MRN: [id number] DOB: Aug 04, 1946   Referred by: Janora Norlander, DO Reason for referral: diabetes   A second unsuccessful telephone outreach was attempted today. The patient was referred to the case management team for assistance with care management and care coordination.   Follow Up Plan: Telephone follow up appointment with care management team member scheduled for: next month  Regina Eck, PharmD, BCPS Clinical Pharmacist, [place]  II Phone [phone]

## 2021-09-29 LAB — URINE CULTURE

## 2021-10-02 LAB — TOXASSURE SELECT 13 (MW), URINE

## 2021-11-01 ENCOUNTER — Telehealth: Payer: Self-pay | Admitting: Family Medicine

## 2021-11-01 MED ORDER — BLOOD GLUCOSE METER KIT: PACK | 0 refills | Status: DC

## 2021-11-01 NOTE — Telephone Encounter (Signed)
Pt called stating that she needs Dr Lajuana Ripple to send order to [facility] for her to get a new glucose meter.   Please advise and call patient with update.

## 2021-11-01 NOTE — Telephone Encounter (Signed)
Patient aware and verbalized understanding. Sent to [facility]

## 2021-11-21 ENCOUNTER — Telehealth: Payer: Self-pay | Admitting: Family Medicine

## 2021-11-21 MED ORDER — ACCU-CHEK GUIDE VI STRP: ORAL_STRIP | 12 refills | Status: DC

## 2021-11-21 MED ORDER — ACCU-CHEK GUIDE W/DEVICE KIT: PACK | 0 refills | Status: DC

## 2021-11-21 NOTE — Telephone Encounter (Signed)
Rx for accu-chek guide meter and strips sent to pharmacy for patient

## 2022-01-24 ENCOUNTER — Encounter: Payer: Self-pay | Admitting: Family Medicine

## 2022-01-24 ENCOUNTER — Ambulatory Visit (INDEPENDENT_AMBULATORY_CARE_PROVIDER_SITE_OTHER): Payer: Medicare Other | Admitting: Family Medicine

## 2022-01-24 VITALS — BP 137/82 | HR 51 | Temp 97.9°F | Ht 64.0 in | Wt 196.6 lb

## 2022-01-24 DIAGNOSIS — F418 Other specified anxiety disorders: Secondary | ICD-10-CM

## 2022-01-24 DIAGNOSIS — K219 Gastro-esophageal reflux disease without esophagitis: Secondary | ICD-10-CM

## 2022-01-24 DIAGNOSIS — Z794 Long term (current) use of insulin: Secondary | ICD-10-CM

## 2022-01-24 DIAGNOSIS — E1159 Type 2 diabetes mellitus with other circulatory complications: Secondary | ICD-10-CM | POA: Diagnosis not present

## 2022-01-24 DIAGNOSIS — E1169 Type 2 diabetes mellitus with other specified complication: Secondary | ICD-10-CM

## 2022-01-24 DIAGNOSIS — I152 Hypertension secondary to endocrine disorders: Secondary | ICD-10-CM

## 2022-01-24 LAB — BAYER DCA HB A1C WAIVED: HB A1C (BAYER DCA - WAIVED): 5.3 % (ref 4.8–5.6)

## 2022-01-24 MED ORDER — OZEMPIC (1 MG/DOSE) 4 MG/3ML ~~LOC~~ SOPN: PEN_INJECTOR | SUBCUTANEOUS | 1 refills | Status: DC

## 2022-01-24 MED ORDER — VALSARTAN 320 MG PO TABS: 320.0000 mg | ORAL_TABLET | Freq: Every day | ORAL | 3 refills | Status: DC

## 2022-01-24 MED ORDER — LORAZEPAM 1 MG PO TABS: ORAL_TABLET | ORAL | 3 refills | Status: DC

## 2022-01-24 MED ORDER — HYDROCHLOROTHIAZIDE 25 MG PO TABS: 25.0000 mg | ORAL_TABLET | Freq: Every day | ORAL | 3 refills | Status: DC

## 2022-01-24 MED ORDER — AMLODIPINE BESYLATE 10 MG PO TABS: 10.0000 mg | ORAL_TABLET | Freq: Every day | ORAL | 3 refills | Status: DC

## 2022-01-24 MED ORDER — METOPROLOL SUCCINATE ER 100 MG PO TB24: 100.0000 mg | ORAL_TABLET | Freq: Every day | ORAL | 3 refills | Status: DC

## 2022-01-24 MED ORDER — LANTUS SOLOSTAR 100 UNIT/ML ~~LOC~~ SOPN: 42.0000 [IU] | PEN_INJECTOR | Freq: Two times a day (BID) | SUBCUTANEOUS | 2 refills | Status: DC

## 2022-01-24 MED ORDER — OMEPRAZOLE 20 MG PO CPDR: 20.0000 mg | DELAYED_RELEASE_CAPSULE | Freq: Every day | ORAL | 3 refills | Status: DC

## 2022-01-24 MED ORDER — FLUOXETINE HCL 10 MG PO CAPS
30.0000 mg | ORAL_CAPSULE | Freq: Every day | ORAL | 3 refills | Status: DC
Start: 2022-01-24 — End: 2022-10-01

## 2022-01-24 NOTE — Progress Notes (Signed)
? ?Subjective: ?CC:DM. ?PCP: Janora Norlander, DO ?[id number] Diana Pratt is a 76 y.o. female presenting to clinic today for: ? ?1. Type 2 Diabetes with hypertension, hyperlipidemia:  ?Patient reports compliance with all of her medications.  She has had a couple of hypoglycemic episodes.  Typically in the 60s for low 70s.  She has had some difficulty getting her Ozempic due to backorder.  She has been instead using her husband's supply. ? ?Last eye exam: UTD ?Last foot exam: UTD ?Last A1c:  ?Lab Results  ?Component Value Date  ? HGBA1C 6.5 (H) 09/25/2021  ? ?Nephropathy screen indicated?: UTD ?Last flu, zoster and/or pneumovax:  ?Immunization History  ?Administered Date(s) Administered  ? DTaP 01/04/2003  ? Pneumococcal Conjugate-13 09/21/2019  ? Pneumococcal Polysaccharide-23 06/30/2018  ? ? ?ROS: No chest pain, shortness of breath.  No falls ? ?2.  Anxiety disorder ?Patient reports that her anxiety has been a little bit worse.  She admits to depressive symptoms are little bit worse.  Her husband has been very ill as of recently and she feels like his PTSD is really advancing.  This causes her to worry.  She is on Prozac 20 mg daily and uses Ativan 1 mg on a fairly regular basis.  Denies any falls, visual or auditory hallucinations.  No changes in memory.  No excessive daytime sedation. ? ?ROS: Per HPI ? ?Allergies  ?Allergen Reactions  ? Crestor [Rosuvastatin]   ?  Myalgias ?  ? Lipitor [Atorvastatin] Other (See Comments)  ?  Myalgias ?  ? Livalo [Pitavastatin] Other (See Comments)  ?  myalgias  ? Morphine And Related Other (See Comments)  ?  Burning, itching  ? Penicillins Hives and Itching  ? Simvastatin Other (See Comments)  ?  myalgias  ? Sulfa Antibiotics Hives and Itching  ? Zetia [Ezetimibe] Cough  ? ?Past Medical History:  ?Diagnosis Date  ? Anxiety   ? Asthma   ? Back pain   ? Cancer ([place]) 2017  ? 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 COPD (chronic obstructive pulmonary disease) ([place])   ? dr.  Kenn File  ? Depression   ? Diabetes mellitus   ? Diabetic neuropathy ([place])   ? GERD (gastroesophageal reflux disease)   ? occ heartburn  ? Humerus fracture   ? right  ? Hyperlipemia   ? Hypertension   ? Interstitial cystitis   ? Osteoporosis   ? Pneumonia   ? 15 yrs ago  ? PONV (postoperative nausea and vomiting)   ? Shortness of breath dyspnea   ? ? ?Current Outpatient Medications:  ?  Accu-Chek Softclix Lancets lancets, Check blood sugar 3 times daily Dx E11.40, Disp: 300 each, Rfl: 3 ?  albuterol (PROAIR HFA) 108 (90 Base) MCG/ACT inhaler, Inhale 2 puffs into the lungs every 6 (six) hours as needed for shortness of breath., Disp: 54 g, Rfl: 2 ?  amLODipine (NORVASC) 10 MG tablet, Take 1 tablet (10 mg total) by mouth daily., Disp: 90 tablet, Rfl: 2 ?  aspirin 81 MG EC tablet, Take 81 mg by mouth daily., Disp: , Rfl:  ?  blood glucose meter kit and supplies, Dispense based on patient and insurance preference. Use up to four times daily as directed. (FOR ICD-10 E10.9, E11.9)., Disp: 1 each, Rfl: 0 ?  Blood Glucose Monitoring Suppl (ACCU-CHEK GUIDE) w/Device KIT, Use to check blood sugar up to TID and as needed. DX E11.49, Disp: 1 kit, Rfl: 0 ?  budesonide-formoterol (  SYMBICORT) 160-4.5 MCG/ACT inhaler, Inhale 2 puffs into the lungs 2 (two) times daily., Disp: 3 each, Rfl: 4 ?  calcium carbonate 200 MG capsule, Take 250 mg by mouth daily., Disp: , Rfl:  ?  Cholecalciferol (VITAMIN D3) 2000 UNITS capsule, Take 2,000 Units by mouth daily. , Disp: 60 capsule, Rfl: 11 ?  conjugated estrogens (PREMARIN) vaginal cream, Apply 0.5gm vaginally 3 days per week., Disp: 90 g, Rfl: 2 ?  FLUoxetine (PROZAC) 20 MG capsule, Take 1 capsule (20 mg total) by mouth daily., Disp: 90 capsule, Rfl: 2 ?  fluticasone (FLONASE) 50 MCG/ACT nasal spray, Place 2 sprays into the nose daily as needed for allergies. Congestion , Disp: , Rfl:  ?  glucose blood (ACCU-CHEK GUIDE) test strip, Use to check BG up to tid.  Dx: type 2 diabetes  requiring insulin therapy E11.40, Disp: 300 each, Rfl: 12 ?  hydrochlorothiazide (HYDRODIURIL) 25 MG tablet, Take 1 tablet (25 mg total) by mouth daily., Disp: 90 tablet, Rfl: 2 ?  hydroxypropyl methylcellulose (ISOPTO TEARS) 2.5 % ophthalmic solution, Place 1 drop into both eyes daily as needed. for dry eyes, Disp: , Rfl:  ?  ibuprofen (ADVIL,MOTRIN) 200 MG tablet, Take 800 mg by mouth as needed., Disp: , Rfl:  ?  insulin glargine (LANTUS SOLOSTAR) 100 UNIT/ML Solostar Pen, Inject 45 Units into the skin 2 (two) times daily., Disp: 90 mL, Rfl: 2 ?  Insulin Pen Needle (PEN NEEDLES) 31G X 6 MM MISC, Use to administer insulin once qid. Dx E11.59 Use Leader brand, Disp: 100 each, Rfl: 3 ?  LORazepam (ATIVAN) 1 MG tablet, Take 1/2-1 tablet daily if needed for anxiety.  May repeat dose once if needed during the day. PUT ON FILE, Disp: 45 tablet, Rfl: 3 ?  metoprolol succinate (TOPROL-XL) 100 MG 24 hr tablet, Take 1 tablet (100 mg total) by mouth daily. Take with or immediately following a meal., Disp: 90 tablet, Rfl: 2 ?  Multiple Vitamins-Minerals (CENTRUM SILVER PO), Take 1 tablet by mouth daily., Disp: , Rfl:  ?  omeprazole (PRILOSEC) 20 MG capsule, Take 1 capsule (20 mg total) by mouth daily., Disp: 30 capsule, Rfl: 0 ?  Semaglutide, 1 MG/DOSE, (OZEMPIC, 1 MG/DOSE,) 4 MG/3ML SOPN, INJECT 1 MG SUBCUTANEOUSLY ONCE A WEEK - ADMINISTER BY SUBCUTANEOUS INJECTION INTO THE ABDOMEN, THIGH, OR UPPER ARM AT ANY TIME OF DAY ON THE SAME DAY EACH WEEK) DISCARD PEN 56 DAYS AFTER FIRST USE, Disp: 9 mL, Rfl: 1 ?  tiZANidine (ZANAFLEX) 4 MG tablet, Take 0.5-1 tablets (2-4 mg total) by mouth every 8 (eight) hours as needed for muscle spasms., Disp: 30 tablet, Rfl: 0 ?  valsartan (DIOVAN) 320 MG tablet, Take 1 tablet (320 mg total) by mouth daily., Disp: 90 tablet, Rfl: 2 ?Social History  ? ?Socioeconomic History  ? Marital status: Married  ?  Spouse name: Wille Glaser  ? Number of children: 1  ? Years of education: 27  ? Highest education  level: Some college, no degree  ?Occupational History  ? Occupation: retired   ?  Comment: CNA   ?Tobacco Use  ? Smoking status: Former  ?  Packs/day: 0.50  ?  Years: 34.00  ?  Pack years: 17.00  ?  Types: Cigarettes  ?  Quit date: 06/01/1994  ?  Years since quitting: 27.6  ? Smokeless tobacco: Never  ?Vaping Use  ? Vaping Use: Never used  ?Substance and Sexual Activity  ? Alcohol use: No  ? Drug use: No  ?  Sexual activity: Yes  ?  Birth control/protection: Surgical  ?  Comment: hyst  ?Other Topics Concern  ? Not on file  ?Social History Narrative  ? 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 ?Social Determinants of Health  ? ?Financial Resource Strain: Low Risk   ? Difficulty of Paying Living Expenses: Not very hard  ?Food Insecurity: No Food Insecurity  ? Worried About Charity fundraiser in the Last Year: Never true  ? Ran Out of Food in the Last Year: Never true  ?Transportation Needs: No Transportation Needs  ? Lack of Transportation (Medical): No  ? Lack of Transportation (Non-Medical): No  ?Physical Activity: Insufficiently Active  ? Days of Exercise per Week: 5 days  ? Minutes of Exercise per Session: 20 min  ?Stress: No Stress Concern Present  ? Feeling of Stress : Only a little  ?Social Connections: Moderately Integrated  ? Frequency of Communication with Friends and Family: More than three times a week  ? Frequency of Social Gatherings with Friends and Family: More than three times a week  ? Attends Religious Services: 1 to 4 times per year  ? Active Member of Clubs or Organizations: No  ? Attends Archivist Meetings: Never  ? Marital Status: Married  ?Intimate Partner Violence: Not At Risk  ? Fear of Current or Ex-Partner: No  ? Emotionally Abused: No  ? Physically Abused: No  ? Sexually Abused: No  ? ?Family History  ?Problem Relation Age of Onset  ? Parkinsonism Mother   ? Dementia Mother   ? Colon cancer Father 17  ?     MULTIPLE  CO-MORBIDITIES  ? Cancer Father   ? Diabetes Paternal Aunt   ? Diabetes Paternal Uncle   ? Diabetes Paternal Grandmother   ? Congestive Heart Failure Paternal Grandfather   ? Congestive Heart Failure Maternal Grandmo

## 2022-01-24 NOTE — Patient Instructions (Signed)
Reduce Lantus to 42 units twice daily.  IF still having low blood sugars, go down to 40 units twice daily. ?

## 2022-05-16 ENCOUNTER — Ambulatory Visit: Payer: Self-pay | Admitting: *Deleted

## 2022-05-16 DIAGNOSIS — E1169 Type 2 diabetes mellitus with other specified complication: Secondary | ICD-10-CM

## 2022-05-16 DIAGNOSIS — E1159 Type 2 diabetes mellitus with other circulatory complications: Secondary | ICD-10-CM

## 2022-05-16 NOTE — Chronic Care Management (AMB) (Signed)
  Chronic Care Management   Note  05/16/2022 Name: Diana Pratt MRN: [id number] DOB: [birth year]   Patient has not recently engaged with the Chronic Care Management RN Care Manager. Removing RN Care Manager from Care Team and closing [facility]. If patient is currently engaged with another CCM team member I will forward this encounter to inform them of my case closure. Patient may be eligible for re-engagement with RN Care Manager in the future if necessary and can discuss this with their PCP.  Chong Sicilian, BSN, RN-BC Embedded Chronic Care Manager [facility] Direct Dial: [phone]

## 2022-05-16 NOTE — Patient Instructions (Signed)
[name]  I have enjoyed working with you through the Chronic Care Management Program at [facility]. Due to program changes and no recent contact with the [facility], I am removing myself from your care team. If you are currently active with another CCM Team Member, you will remain active with them unless they reach out to you with additional information. If you feel that you need RN Care Management services in the future, please talk with your primary care provider to discuss re-engagement with the RN Care Manager.   Thank you for allowing me to participate in your your healthcare journey.  Chong Sicilian, BSN, RN-BC Embedded Chronic Care Manager [facility] Direct Dial: [phone]

## 2022-05-23 LAB — HM DIABETES EYE EXAM

## 2022-05-25 ENCOUNTER — Encounter: Payer: Self-pay | Admitting: Family Medicine

## 2022-05-25 ENCOUNTER — Ambulatory Visit (INDEPENDENT_AMBULATORY_CARE_PROVIDER_SITE_OTHER): Payer: Medicare Other | Admitting: Family Medicine

## 2022-05-25 VITALS — BP 145/54 | HR 57 | Temp 97.1°F | Ht 64.0 in | Wt 194.2 lb

## 2022-05-25 DIAGNOSIS — J449 Chronic obstructive pulmonary disease, unspecified: Secondary | ICD-10-CM | POA: Diagnosis not present

## 2022-05-25 DIAGNOSIS — E1169 Type 2 diabetes mellitus with other specified complication: Secondary | ICD-10-CM | POA: Diagnosis not present

## 2022-05-25 DIAGNOSIS — J45909 Unspecified asthma, uncomplicated: Secondary | ICD-10-CM | POA: Diagnosis not present

## 2022-05-25 DIAGNOSIS — Z794 Long term (current) use of insulin: Secondary | ICD-10-CM

## 2022-05-25 DIAGNOSIS — F418 Other specified anxiety disorders: Secondary | ICD-10-CM

## 2022-05-25 DIAGNOSIS — M5442 Lumbago with sciatica, left side: Secondary | ICD-10-CM

## 2022-05-25 DIAGNOSIS — I152 Hypertension secondary to endocrine disorders: Secondary | ICD-10-CM

## 2022-05-25 DIAGNOSIS — E1159 Type 2 diabetes mellitus with other circulatory complications: Secondary | ICD-10-CM

## 2022-05-25 DIAGNOSIS — Z79899 Other long term (current) drug therapy: Secondary | ICD-10-CM

## 2022-05-25 DIAGNOSIS — G8929 Other chronic pain: Secondary | ICD-10-CM

## 2022-05-25 LAB — BAYER DCA HB A1C WAIVED: HB A1C (BAYER DCA - WAIVED): 5.2 % (ref 4.8–5.6)

## 2022-05-25 MED ORDER — BUDESONIDE-FORMOTEROL FUMARATE 160-4.5 MCG/ACT IN AERO: 2.0000 | INHALATION_SPRAY | Freq: Two times a day (BID) | RESPIRATORY_TRACT | 4 refills | Status: DC

## 2022-05-25 MED ORDER — LORAZEPAM 1 MG PO TABS: ORAL_TABLET | ORAL | 3 refills | Status: DC

## 2022-05-25 MED ORDER — ALBUTEROL SULFATE HFA 108 (90 BASE) MCG/ACT IN AERS: 2.0000 | INHALATION_SPRAY | Freq: Four times a day (QID) | RESPIRATORY_TRACT | 2 refills | Status: AC | PRN

## 2022-05-25 NOTE — Progress Notes (Signed)
Subjective: CC:Dm PCP: Janora Norlander, DO BTD:[id number] Diana Pratt is a 76 y.o. female presenting to clinic today for:  1. Type 2 Diabetes with hypertension, hyperlipidemia:  Patient reports compliance with all of Diana Pratt medications.  Does not report any nausea, vomiting, abdominal pain, chest pain, shortness of breath or visual disturbance.  Last eye exam: Up-to-date Last foot exam: Up-to-date Last A1c:  Lab Results  Component Value Date   HGBA1C 5.3 01/24/2022   Nephropathy screen indicated?:  Up-to-date Last flu, zoster and/or pneumovax:  Immunization History  Administered Date(s) Administered   DTaP 01/04/2003   Pneumococcal Conjugate-13 09/21/2019   Pneumococcal Polysaccharide-23 06/30/2018    2.  Anxiety and depression Patient is compliant with Diana Pratt medications.  Needs refills on Ativan.  Denies any heart palpitations, excessive daytime sedation, visual auditory hallucinations.  No memory issues.  3.  Hip pain Patient reports posterior left hip pain that radiates down the back of Diana Pratt leg.  She feels like this is sciatica and was treated at urgent care x2 with both intramuscular and oral corticosteroids.  She has not yet seen physical therapy but would be willing to see them as this helped with Diana Pratt previous shoulder issues.  She denies any falls.  She does report some gait instability when she rises from a seated position as Diana Pratt left lower extremity feels unstable.  She does not report any saddle anesthesia, fecal incontinence or urinary retention.  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 Kenn File   Depression    Diabetes mellitus    Diabetic neuropathy ([place])    GERD (gastroesophageal reflux disease)    occ heartburn   Humerus fracture    right   Hyperlipemia    Hypertension    Interstitial cystitis    Osteoporosis    Pneumonia    15 yrs ago   PONV (postoperative nausea and vomiting)    Shortness of breath dyspnea     Current Outpatient Medications:    Accu-Chek Softclix Lancets lancets, Check blood sugar 3 times daily Dx E11.40, Disp: 300 each, Rfl: 3   albuterol (PROAIR HFA) 108 (90 Base) MCG/ACT inhaler, Inhale 2 puffs into the lungs every 6 (six) hours as needed for shortness of breath., Disp: 54 g, Rfl: 2   amLODipine (NORVASC) 10 MG tablet, Take 1 tablet (10 mg total) by mouth daily., Disp: 90 tablet, Rfl: 3   aspirin 81 MG EC tablet, Take 81 mg by mouth daily., Disp: , Rfl:    blood glucose meter kit and supplies, Dispense based on patient and insurance preference. Use up to four times daily as directed. (FOR ICD-10 E10.9, E11.9)., Disp: 1 each, Rfl: 0   Blood Glucose Monitoring  Suppl (ACCU-CHEK GUIDE) w/Device KIT, Use to check blood sugar up to TID and as needed. DX E11.49, Disp: 1 kit, Rfl: 0   budesonide-formoterol (SYMBICORT) 160-4.5 MCG/ACT inhaler, Inhale 2 puffs into the lungs 2 (two) times daily., Disp: 3 each, Rfl: 4   calcium carbonate 200 MG capsule, Take 250 mg by mouth daily., Disp: , Rfl:    Cholecalciferol (VITAMIN D3) 2000 UNITS capsule, Take 2,000 Units by mouth daily. , Disp: 60 capsule, Rfl: 11   conjugated estrogens (PREMARIN) vaginal cream, Apply 0.5gm vaginally 3 days per week., Disp: 90 g, Rfl: 2   FLUoxetine (PROZAC) 10 MG capsule, Take 3 capsules (30 mg total) by mouth daily.,  Disp: 270 capsule, Rfl: 3   fluticasone (FLONASE) 50 MCG/ACT nasal spray, Place 2 sprays into the nose daily as needed for allergies. Congestion , Disp: , Rfl:    glucose blood (ACCU-CHEK GUIDE) test strip, Use to check BG up to tid.  Dx: type 2 diabetes requiring insulin therapy E11.40, Disp: 300 each, Rfl: 12   hydrochlorothiazide (HYDRODIURIL) 25 MG tablet, Take 1 tablet (25 mg total) by mouth daily., Disp: 90 tablet, Rfl: 3   hydroxypropyl methylcellulose (ISOPTO TEARS) 2.5 % ophthalmic solution, Place 1 drop into both eyes daily as needed. for dry eyes, Disp: , Rfl:    insulin glargine (LANTUS SOLOSTAR) 100 UNIT/ML Solostar Pen, Inject 42 Units into the skin 2 (two) times daily., Disp: 90 mL, Rfl: 2   Insulin Pen Needle (PEN NEEDLES) 31G X 6 MM MISC, Use to administer insulin once qid. Dx E11.59 Use Leader brand, Disp: 100 each, Rfl: 3   [START ON 06/02/2022] LORazepam (ATIVAN) 1 MG tablet, Take 1/2-1 tablet daily if needed for anxiety.  May repeat dose once if needed during the day. PUT ON FILE, Disp: 45 tablet, Rfl: 3   metoprolol succinate (TOPROL-XL) 100 MG 24 hr tablet, Take 1 tablet (100 mg total) by mouth daily. Take with or immediately following a meal., Disp: 90 tablet, Rfl: 3   Multiple Vitamins-Minerals (CENTRUM SILVER PO), Take 1 tablet by mouth daily., Disp: , Rfl:    omeprazole (PRILOSEC) 20 MG capsule, Take 1 capsule (20 mg total) by mouth daily., Disp: 90 capsule, Rfl: 3   Semaglutide, 1 MG/DOSE, (OZEMPIC, 1 MG/DOSE,) 4 MG/3ML SOPN, INJECT 1 MG SUBCUTANEOUSLY ONCE A WEEK - ADMINISTER BY SUBCUTANEOUS INJECTION INTO THE ABDOMEN, THIGH, OR UPPER ARM AT ANY TIME OF DAY ON THE SAME DAY EACH WEEK) DISCARD PEN 56 DAYS AFTER FIRST USE, Disp: 9 mL, Rfl: 1   tiZANidine (ZANAFLEX) 4 MG tablet, Take 0.5-1 tablets (2-4 mg total) by mouth every 8 (eight) hours as needed for muscle spasms., Disp: 30 tablet, Rfl: 0   valsartan (DIOVAN) 320 MG tablet, Take 1 tablet (320 mg total) by mouth daily.,  Disp: 90 tablet, Rfl: 3 Social History   Socioeconomic History   Marital status: Married    Spouse name: [name]   Number of children: 1   Years of education: 13   Highest education level: Some college, no degree  Occupational History   Occupation: retired     Comment: CNA   Tobacco Use   Smoking status: Former    Packs/day: 0.50    Years: 34.00    Total pack years: 17.00    Types: Cigarettes    Quit date: 06/01/1994    Years since quitting: 28.0   Smokeless tobacco: Never  Vaping Use   Vaping Use: Never used  Substance and Sexual Activity  Alcohol use: No   Drug use: No   Sexual activity: Yes    Birth control/protection: Surgical    Comment: hyst  Other Topics Concern   Not on file  Social History Narrative   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 [place]   Social Determinants of Health   Financial Resource Strain: Low Risk  (07/07/2021)   Overall Financial Resource Strain (CARDIA)    Difficulty of Paying Living Expenses: Not very hard  Food Insecurity: No Food Insecurity (07/07/2021)   Hunger Vital Sign    Worried About Running Out of Food in the Last Year: Never true    Ran Out of Food in the Last Year: Never true  Transportation Needs: No Transportation Needs (07/07/2021)   PRAPARE - Hydrologist (Medical): No    Lack of Transportation (Non-Medical): No  Physical Activity: Insufficiently Active (07/07/2021)   Exercise Vital Sign    Days of Exercise per Week: 5 days    Minutes of Exercise per Session: 20 min  Stress: No Stress Concern Present (07/07/2021)   [place]    Feeling of Stress : Only a little  Social Connections: Moderately Integrated (07/07/2021)   Social Connection and Isolation Panel [NHANES]    Frequency of Communication with Friends and Family: More than three times a week    Frequency of Social Gatherings with  Friends and Family: More than three times a week    Attends Religious Services: 1 to 4 times per year    Active Member of [employer] or Organizations: No    Attends Archivist Meetings: Never    Marital Status: Married  Human resources officer Violence: Not At Risk (07/07/2021)   Humiliation, Afraid, Rape, and Kick questionnaire    Fear of Current or Ex-Partner: No    Emotionally Abused: No    Physically Abused: No    Sexually Abused: No   Family History  Problem Relation Age of Onset   Parkinsonism Mother    Dementia Mother    Colon cancer Father 87       MULTIPLE CO-MORBIDITIES   Cancer Father    Diabetes Paternal Aunt    Diabetes Paternal Uncle    Diabetes Paternal Grandmother    Congestive Heart Failure Paternal Grandfather    Congestive Heart Failure Maternal Grandmother    Stroke Maternal Grandfather    Colon polyps Neg Hx     Objective: Office vital signs reviewed. BP (!) 145/54   Pulse (!) 57   Temp (!) 97.1 F (36.2 C)   Ht '5\' 4"'  (1.626 m)   Wt 194 lb 3.2 oz (88.1 kg)   BMI 33.33 kg/m   Physical Examination:  General: Awake, alert, obese, nontoxic, No acute distress HEENT: Sclera white.  Moist mucous membranes.  Wears glasses Cardio: regular rate and rhythm, S1S2 heard, no murmurs appreciated Pulm: clear to auscultation bilaterally, no wheezes, rhonchi or rales; normal work of breathing on room air Extremities: warm, well perfused, No edema, cyanosis or clubbing; +2 pulses bilaterally MSK: Antalgic gait and station  Lumbar spine: No midline tenderness palpation.  She does have paraspinal muscle tenderness palpation, particularly over the SI and lumbosacral junction.  No palpable bony abnormalities.  Left lower extremity with 4/5 strength in flexion.  Light touch sensation grossly intact throughout bilateral lower extremities Psych: Mood stable, speech normal, affect appropriate.  Pleasant, interactive.  Linear thought process.  Good eye contact.     09/25/2021     1:39 PM 07/07/2021    2:11 PM 05/25/2021    1:27 PM  Depression screen PHQ 2/9  Decreased Interest 0 0 0  Down, Depressed, Hopeless 1 0 0  PHQ - 2 Score 1 0 0  Altered sleeping '1 1 1  ' Tired, decreased energy '1 1 1  ' Change in appetite 0 0 0  Feeling bad or failure about yourself  0 0 0  Trouble concentrating 0 0 0  Moving slowly or fidgety/restless 0 0 0  Suicidal thoughts 0 0 0  PHQ-9 Score '3 2 2  ' Difficult doing work/chores Not difficult at all Not difficult at all Not difficult at all      09/25/2021    1:40 PM 05/25/2021    1:27 PM 01/23/2021    1:11 PM 04/18/2020    1:29 PM  GAD 7 : Generalized Anxiety Score  Nervous, Anxious, on Edge 1 0 1 1  Control/stop worrying 0 0 0 1  Worry too much - different things 0 0 0 1  Trouble relaxing 1 0 1 0  Restless 0 0 0 0  Easily annoyed or irritable 1 0 0 0  Afraid - awful might happen 0 0 0 0  Total GAD 7 Score 3 0 2 3  Anxiety Difficulty Not difficult at all Not difficult at all Somewhat difficult Not difficult at all   Assessment/ Plan: 76 y.o. female   Type 2 diabetes mellitus with other specified complication, with long-term current use of insulin ([place]) - Plan: Bayer DCA Hb A1c Waived, CMP14+EGFR  Hypertension associated with diabetes ([place])  Uncomplicated asthma, unspecified asthma severity, unspecified whether persistent - Plan: budesonide-formoterol (SYMBICORT) 160-4.5 MCG/ACT inhaler  Chronic obstructive pulmonary disease, unspecified COPD type ([place]) - Plan: budesonide-formoterol (SYMBICORT) 160-4.5 MCG/ACT inhaler  Depression with anxiety - Plan: LORazepam (ATIVAN) 1 MG tablet, ToxASSURE Select 13 (MW), Urine  Chronic prescription benzodiazepine use - Plan: ToxASSURE Select 13 (MW), Urine  Chronic left-sided low back pain with left-sided sciatica - Plan: Ambulatory referral to Physical Therapy  Sugar remains under excellent and very tight control.  Could consider backing down on the insulin.  I would like Diana Pratt to  follow-up with CCM for download of Diana Pratt freestyle libre  Blood pressure is controlled for age.  No changes  Symbicort renewed.  Depression and anxiety are stable.  Continue current regimen.  National narcotic database reviewed and there were no red flags.  UDS and CSC were updated as per office policy  Referral to physical therapy placed.  Plan to follow-up in 6 -8 weeks for reassessment.  We will plan to refer to orthopedics and/or order MRI at that time pending Diana Pratt response to physical therapy  Orders Placed This Encounter  Procedures   Bayer DCA Hb A1c Waived   CMP14+EGFR   Meds ordered this encounter  Medications   albuterol (PROAIR HFA) 108 (90 Base) MCG/ACT inhaler    Sig: Inhale 2 puffs into the lungs every 6 (six) hours as needed for shortness of breath.    Dispense:  54 g    Refill:  2   budesonide-formoterol (SYMBICORT) 160-4.5 MCG/ACT inhaler    Sig: Inhale 2 puffs into the lungs 2 (two) times daily.    Dispense:  3 each    Refill:  4    For PAP program   LORazepam (ATIVAN) 1 MG tablet    Sig: Take 1/2-1 tablet daily if needed for anxiety.  May repeat dose once if needed during the day. PUT ON FILE    Dispense:  45 tablet    Refill:  [place], [place] [phone]

## 2022-05-26 LAB — CMP14+EGFR
ALT: 14 IU/L (ref 0–32)
AST: 16 IU/L (ref 0–40)
Albumin/Globulin Ratio: 1.6 (ref 1.2–2.2)
Albumin: 4.2 g/dL (ref 3.8–4.8)
Alkaline Phosphatase: 100 IU/L (ref 44–121)
BUN/Creatinine Ratio: 21 (ref 12–28)
BUN: 20 mg/dL (ref 8–27)
Bilirubin Total: 0.4 mg/dL (ref 0.0–1.2)
CO2: 24 mmol/L (ref 20–29)
Calcium: 9.1 mg/dL (ref 8.7–10.3)
Chloride: 102 mmol/L (ref 96–106)
Creatinine, Ser: 0.94 mg/dL (ref 0.57–1.00)
Globulin, Total: 2.7 g/dL (ref 1.5–4.5)
Glucose: 93 mg/dL (ref 70–99)
Potassium: 4 mmol/L (ref 3.5–5.2)
Sodium: 140 mmol/L (ref 134–144)
Total Protein: 6.9 g/dL (ref 6.0–8.5)
eGFR: 63 mL/min/{1.73_m2} (ref >59)

## 2022-05-30 LAB — TOXASSURE SELECT 13 (MW), URINE

## 2022-05-31 ENCOUNTER — Encounter: Payer: Self-pay | Admitting: Physical Therapy

## 2022-05-31 ENCOUNTER — Other Ambulatory Visit: Payer: Self-pay

## 2022-05-31 ENCOUNTER — Ambulatory Visit: Payer: Medicare Other | Attending: Family Medicine | Admitting: Physical Therapy

## 2022-05-31 DIAGNOSIS — G8929 Other chronic pain: Secondary | ICD-10-CM | POA: Diagnosis not present

## 2022-05-31 DIAGNOSIS — M5459 Other low back pain: Secondary | ICD-10-CM | POA: Insufficient documentation

## 2022-05-31 DIAGNOSIS — M5442 Lumbago with sciatica, left side: Secondary | ICD-10-CM | POA: Insufficient documentation

## 2022-05-31 NOTE — Therapy (Signed)
OUTPATIENT PHYSICAL THERAPY THORACOLUMBAR EVALUATION   Patient Name: Diana Pratt MRN: [id number] DOB:22-Dec-1945, 76 y.o., female Today's Date: 05/31/2022   PT End of Session - 05/31/22 0931     Visit Number 1    Number of Visits 12    Date for PT Re-Evaluation 07/12/22    Authorization Type FOTO AT LEAST EVERY 5TH VISIT.  PROGRESS NOTE AT 10TH VISIT.  KX MODIFIER AFTER 15 VISITS.    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 Kenn File   Depression    Diabetes mellitus    Diabetic neuropathy ([place])    GERD (gastroesophageal reflux disease)    occ heartburn   Humerus fracture    right   Hyperlipemia    Hypertension    Interstitial cystitis    Osteoporosis    Pneumonia    15 yrs ago   PONV (postoperative nausea and vomiting)    Shortness of breath dyspnea    Past Surgical History:  Procedure Laterality Date   ABDOMINAL HYSTERECTOMY  03/1984   partial   COLONOSCOPY N/A 07/19/2014   Procedure: COLONOSCOPY;  Surgeon: Danie Binder, MD;  [facility];  Service: Endoscopy;  Laterality: N/A;  9:30   REVERSE SHOULDER ARTHROPLASTY Right 06/14/2016   Procedure: REVERSE SHOULDER ARTHROPLASTY;  Surgeon: Justice Britain, MD;  Location: [facility];  Service: Orthopedics;  Laterality: Right;   Skin cancer removed  08/07/2016   left ear   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   Anxiety 07/26/2017   Vaginal dryness 04/10/2017   Vaginal atrophy 04/10/2017   Urinary incontinence 04/10/2017   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   Abdominal pain, acute, bilateral lower quadrant 07/01/2014   Transaminitis 07/01/2014   COPD (chronic obstructive pulmonary disease) ([place])    Asthma    Interstitial cystitis    Unspecified vitamin D deficiency 05/29/2013   Lumbar radiculopathy 05/29/2013   Back strain 05/29/2013   Unspecified asthma(493.90) 02/27/2013   Depression with anxiety 02/22/2011   Diabetes mellitus ([place]) 02/22/2011   Hyperlipidemia associated with type 2 diabetes mellitus ([place]) 02/22/2011   Hypertension associated with diabetes ([place]) 02/22/2011    REFERRING PROVIDER: Ronnie Doss DO  REFERRING DIAG: Chronic left-sided low back pain with left-sided sciatica.  Rationale for Evaluation and Treatment Rehabilitation  THERAPY DIAG:  Other low back pain  ONSET DATE: ~2 months.  SUBJECTIVE:  SUBJECTIVE STATEMENT: The patient presents to the clinic today with c/o left-sided low back pain over the last two months.  She experiences pain going into her left buttock to about the level of her left posterior knee.  Her pain is moderate while sitting but when she stands up she experiences severe pain and at times it feels like her left leg will give way.  Ice and heat can help to decrease pain.   PERTINENT HISTORY:  Right reverse total shoulder replacement, DM, HTN, OP.  PAIN:  Are you having pain? Moderate.  Left low back and buttock.  Sore, shooting.   PRECAUTIONS: Other: OP.  WEIGHT BEARING RESTRICTIONS No  FALLS:  Has patient fallen in last 6 months? No  LIVING ENVIRONMENT: Lives with: lives with their spouse Lives in: House/apartment Has following equipment at home: None  OCCUPATION: Retired.  PLOF: Independent with basic ADLs  PATIENT GOALS Get out of pain.   OBJECTIVE:    DIAGNOSTIC FINDINGS:  No acute osseous abnormality. Please note: Spine radiography has limited sensitivity and specificity in the setting of significant trauma. If there is significant mechanism, recommend low threshold for CT imaging. Mild multilevel degenerative changes throughout the lumbar spine, maximal at L5-S1.  PATIENT SURVEYS:  FOTO Complete.  POSTURE: rounded shoulders, forward head, decreased lumbar lordosis, and posterior pelvic tilt  PALPATION: Tender to palpation in left lower lumbar/SIJ region and left gluteal region with increased tone noted.    LOWER EXTREMITY ROM:                         Left hip flexion to 105 degrees.  LOWER EXTREMITY MMT:    Left hip flexion 4/t.  LUMBAR SPECIAL TESTS:  Straight leg raise test: Negative.  Equal leg lengths.  LE DTR's are 1+/4=.  GAIT: Slow and antalgic gait pattern noted.  TODAY'S TREATMENT  HMP and IFC to patient's left LB/Gluteal region at 80-150 Hz on 40% scan x 20 minutes.  HOME EXERCISE PROGRAM: Left SKTC stretch.  ASSESSMENT:  CLINICAL IMPRESSION: The patient presents to [facility] c/o left low back pain with radiation into her left buttock and posterior thigh to about the level of the knee.  This pain has been ongoing for about two months.  She is palpably tender in her left lower lumbar/SIJ region and gluteal musculature which is remarkable for increased tone.  She has some decrease in left hip flexion.  SLR test is negative.  Going from sit to stand and walking longer distance can produce severe pain.  Patient will benefit from skilled physical therapy intervention to address pain and deficits.  OBJECTIVE IMPAIRMENTS Abnormal gait, decreased activity tolerance, decreased ROM, increased muscle spasms, and pain.   ACTIVITY LIMITATIONS standing and locomotion level  PARTICIPATION LIMITATIONS: meal prep, cleaning, laundry, and yard work  [employer] POTENTIAL: Excellent  CLINICAL DECISION MAKING:  Stable/uncomplicated  EVALUATION COMPLEXITY: Low   GOALS:  SHORT TERM GOALS: Target date: 06/14/2022  Independent with an HEP. Baseline: Goal status: INITIAL  LONG TERM GOALS: Target date: 07/12/2022  Sit to stand with pain not > 2-3/10. Baseline:  Goal status: INITIAL  2.  Stand 20 minutes with pain not > 3/10. Baseline:  Goal status: INITIAL  3.  Walk a community distance with pain not > 3/10. Baseline:  Goal status: INITIAL  4.  Eliminate left LE symptoms. Baseline:  Goal status: INITIAL  PLAN: PT FREQUENCY: 2x/week  PT DURATION: 6 weeks  PLANNED INTERVENTIONS: Therapeutic exercises, Therapeutic  activity, Neuromuscular re-education, Gait training, Patient/Family education, Self Care, Dry Needling, Electrical stimulation, Cryotherapy, Moist heat, Ultrasound, and Manual therapy.  PLAN FOR NEXT SESSION: Modalities and STW/M as needed\.  Core exercise progression.   Anjoli Diemer, [place], PT 05/31/2022, 11:10 AM

## 2022-06-04 ENCOUNTER — Ambulatory Visit: Payer: Medicare Other

## 2022-06-04 DIAGNOSIS — M5459 Other low back pain: Secondary | ICD-10-CM | POA: Diagnosis not present

## 2022-06-04 NOTE — Therapy (Signed)
OUTPATIENT PHYSICAL THERAPY THORACOLUMBAR TREATMENT NOTE   Patient Name: Diana Pratt MRN: [id number] DOB:1946-06-28, 76 y.o., female Today's Date: 06/04/2022   PT End of Session - 06/04/22 0907     Visit Number 2    Number of Visits 12    Date for PT Re-Evaluation 07/12/22    Authorization Type FOTO AT LEAST EVERY 5TH VISIT.  PROGRESS NOTE AT 10TH VISIT.  KX MODIFIER AFTER 15 VISITS.    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 Kenn File   Depression    Diabetes mellitus    Diabetic neuropathy ([place])    GERD (gastroesophageal reflux disease)    occ heartburn   Humerus fracture    right   Hyperlipemia    Hypertension    Interstitial cystitis    Osteoporosis    Pneumonia    15 yrs ago   PONV (postoperative nausea and vomiting)    Shortness of breath dyspnea    Past Surgical History:  Procedure Laterality Date   ABDOMINAL HYSTERECTOMY  03/1984   partial   COLONOSCOPY N/A 07/19/2014   Procedure: COLONOSCOPY;  Surgeon: Danie Binder, MD;  [facility];  Service: Endoscopy;  Laterality: N/A;  9:30   REVERSE SHOULDER ARTHROPLASTY Right 06/14/2016   Procedure: REVERSE SHOULDER ARTHROPLASTY;  Surgeon: Justice Britain, MD;  Location: [place];  Service: Orthopedics;  Laterality: Right;   Skin cancer removed  08/07/2016   left ear   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   Anxiety 07/26/2017   Vaginal dryness 04/10/2017   Vaginal atrophy 04/10/2017   Urinary incontinence 04/10/2017   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   Abdominal pain, acute, bilateral lower quadrant 07/01/2014   Transaminitis 07/01/2014   COPD (chronic obstructive pulmonary disease) ([place])    Asthma    Interstitial cystitis    Unspecified vitamin D deficiency 05/29/2013   Lumbar radiculopathy 05/29/2013   Back strain 05/29/2013   Unspecified asthma(493.90) 02/27/2013   Depression with anxiety 02/22/2011   Diabetes mellitus ([place]) 02/22/2011   Hyperlipidemia associated with type 2 diabetes mellitus ([place]) 02/22/2011   Hypertension associated with diabetes ([place]) 02/22/2011    REFERRING PROVIDER: Ronnie Doss DO  REFERRING DIAG: Chronic left-sided low back pain with left-sided sciatica.  Rationale for Evaluation and Treatment Rehabilitation  THERAPY DIAG:  Other low back pain  ONSET DATE: ~2 months.  SUBJECTIVE:  SUBJECTIVE STATEMENT: Pt arrives for today's treatment session reporting increased left hip and buttock pain.   PERTINENT HISTORY:  Right reverse total shoulder replacement, DM, HTN, OP.  PAIN:  Are you having pain? 8/10 left hip and buttock region   PRECAUTIONS: Other: OP.  WEIGHT BEARING RESTRICTIONS No  FALLS:  Has patient fallen in last 6 months? No  LIVING ENVIRONMENT: Lives with: lives with their spouse Lives in: House/apartment Has following equipment at home: None  OCCUPATION: Retired.  PLOF: Independent with basic ADLs  PATIENT GOALS Get out of pain.   OBJECTIVE:   DIAGNOSTIC FINDINGS:  No acute osseous abnormality. Please note: Spine radiography has limited sensitivity and specificity in the setting of significant trauma. If there is significant mechanism, recommend low threshold for CT imaging. Mild multilevel degenerative changes throughout the lumbar spine, maximal at  L5-S1.  PATIENT SURVEYS:  FOTO Complete.  POSTURE: rounded shoulders, forward head, decreased lumbar lordosis, and posterior pelvic tilt  PALPATION: Tender to palpation in left lower lumbar/SIJ region and left gluteal region with increased tone noted.    LOWER EXTREMITY ROM:                         Left hip flexion to 105 degrees.  LOWER EXTREMITY MMT:    Left hip flexion 4/t.  LUMBAR SPECIAL TESTS:  Straight leg raise test: Negative.  Equal leg lengths.  LE DTR's are 1+/4=.  GAIT: Slow and antalgic gait pattern noted.  TODAY'S TREATMENT   Manual Therapy Soft Tissue Mobilization: left glute, piriformis, IT band, and proximal hamstrings, STW/M performed to left glute, piriformis, IT band, and proximal hamstring to decrease pain and tone with pt in semi right side-lying    Modalities  Date:  Unattended Estim: Hip, IFC 80-150 Hz, 20 mins, Pain and Tone Hot Pack: Hip, 20 mins, Pain and Tone             HOME EXERCISE PROGRAM: Left SKTC stretch.  ASSESSMENT:  CLINICAL IMPRESSION: Pt arrives for today's treatment session reporting 8/10 left hip and buttock pain.  Pt reports that she utilizes heat and ice at home as needed as well as pain medication which gives her some relief.  STW/M performed to left glute, piriformis, IT band, and proximal hamstring to decrease pain and tone with some relief.  Normal responses to estim and MH noted upon removal.  Pt reported 5/10 left hip and buttock pain at completion of today's treatment session.    OBJECTIVE IMPAIRMENTS Abnormal gait, decreased activity tolerance, decreased ROM, increased muscle spasms, and pain.   ACTIVITY LIMITATIONS standing and locomotion level  PARTICIPATION LIMITATIONS: meal prep, cleaning, laundry, and yard work  [employer] POTENTIAL: Excellent  CLINICAL DECISION MAKING: Stable/uncomplicated  EVALUATION COMPLEXITY: Low   GOALS:  SHORT TERM GOALS: Target date: 06/18/2022  Independent with an  HEP. Baseline: Goal status: INITIAL  LONG TERM GOALS: Target date: 07/16/2022  Sit to stand with pain not > 2-3/10. Baseline:  Goal status: INITIAL  2.  Stand 20 minutes with pain not > 3/10. Baseline:  Goal status: INITIAL  3.  Walk a community distance with pain not > 3/10. Baseline:  Goal status: INITIAL  4.  Eliminate left LE symptoms. Baseline:  Goal status: INITIAL  PLAN: PT FREQUENCY: 2x/week  PT DURATION: 6 weeks  PLANNED INTERVENTIONS: Therapeutic exercises, Therapeutic activity, Neuromuscular re-education, Gait training, Patient/Family education, Self Care, Dry Needling, Electrical stimulation, Cryotherapy, Moist heat, Ultrasound, and Manual therapy.  PLAN FOR  NEXT SESSION: Modalities and STW/M as needed\.  Core exercise progression.   Kathrynn Ducking, PTA 06/04/2022, 9:58 AM

## 2022-06-07 ENCOUNTER — Ambulatory Visit: Payer: Medicare Other | Attending: Family Medicine

## 2022-06-07 DIAGNOSIS — M5459 Other low back pain: Secondary | ICD-10-CM | POA: Insufficient documentation

## 2022-06-07 NOTE — Therapy (Signed)
OUTPATIENT PHYSICAL THERAPY THORACOLUMBAR TREATMENT NOTE   Patient Name: Diana Pratt MRN: [id number] DOB:1946-03-03, 76 y.o., female Today's Date: 06/07/2022   PT End of Session - 06/07/22 1120     Visit Number 3    Number of Visits 12    Date for PT Re-Evaluation 07/12/22    Authorization Type FOTO AT LEAST EVERY 5TH VISIT.  PROGRESS NOTE AT 10TH VISIT.  KX MODIFIER AFTER 15 VISITS.    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 Kenn File   Depression    Diabetes mellitus    Diabetic neuropathy ([place])    GERD (gastroesophageal reflux disease)    occ heartburn   Humerus fracture    right   Hyperlipemia    Hypertension    Interstitial cystitis    Osteoporosis    Pneumonia    15 yrs ago   PONV (postoperative nausea and vomiting)    Shortness of breath dyspnea    Past Surgical History:  Procedure Laterality Date   ABDOMINAL HYSTERECTOMY  03/1984   partial   COLONOSCOPY N/A 07/19/2014   Procedure: COLONOSCOPY;  Surgeon: Danie Binder, MD;  [facility];  Service: Endoscopy;  Laterality: N/A;  9:30   REVERSE SHOULDER ARTHROPLASTY Right 06/14/2016   Procedure: REVERSE SHOULDER ARTHROPLASTY;  Surgeon: Justice Britain, MD;  Location: [place];  Service: Orthopedics;  Laterality: Right;   Skin cancer removed  08/07/2016   left ear   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   Anxiety 07/26/2017   Vaginal dryness 04/10/2017   Vaginal atrophy 04/10/2017   Urinary incontinence 04/10/2017   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   Abdominal pain, acute, bilateral lower quadrant 07/01/2014   Transaminitis 07/01/2014   COPD (chronic obstructive pulmonary disease) ([place])    Asthma    Interstitial cystitis    Unspecified vitamin D deficiency 05/29/2013   Lumbar radiculopathy 05/29/2013   Back strain 05/29/2013   Unspecified asthma(493.90) 02/27/2013   Depression with anxiety 02/22/2011   Diabetes mellitus ([place]) 02/22/2011   Hyperlipidemia associated with type 2 diabetes mellitus ([place]) 02/22/2011   Hypertension associated with diabetes ([place]) 02/22/2011    REFERRING PROVIDER: Ronnie Doss DO  REFERRING DIAG: Chronic left-sided low back pain with left-sided sciatica.  Rationale for Evaluation and Treatment Rehabilitation  THERAPY DIAG:  Other low back pain  ONSET DATE: ~2 months.  SUBJECTIVE:  SUBJECTIVE STATEMENT: Pt arrives for today's treatment session reporting increased left hip and buttock pain.   PERTINENT HISTORY:  Right reverse total shoulder replacement, DM, HTN, OP.  PAIN:  Are you having pain? 6/10 left hip and buttock region   PRECAUTIONS: Other: OP.  WEIGHT BEARING RESTRICTIONS No  FALLS:  Has patient fallen in last 6 months? No  LIVING ENVIRONMENT: Lives with: lives with their spouse Lives in: House/apartment Has following equipment at home: None  OCCUPATION: Retired.  PLOF: Independent with basic ADLs  PATIENT GOALS Get out of pain.   OBJECTIVE:   DIAGNOSTIC FINDINGS:  No acute osseous abnormality. Please note: Spine radiography has limited sensitivity and specificity in the setting of significant trauma. If there is significant mechanism, recommend low threshold for CT imaging. Mild multilevel degenerative changes throughout the lumbar spine, maximal at  L5-S1.  PATIENT SURVEYS:  FOTO Complete.  POSTURE: rounded shoulders, forward head, decreased lumbar lordosis, and posterior pelvic tilt  PALPATION: Tender to palpation in left lower lumbar/SIJ region and left gluteal region with increased tone noted.    LOWER EXTREMITY ROM:                         Left hip flexion to 105 degrees.  LOWER EXTREMITY MMT:    Left hip flexion 4/t.  LUMBAR SPECIAL TESTS:  Straight leg raise test: Negative.  Equal leg lengths.  LE DTR's are 1+/4=.  GAIT: Slow and antalgic gait pattern noted.  TODAY'S TREATMENT                                      EXERCISE LOG  Exercise Repetitions and Resistance Comments  Nustep Lvl 3-4 x 15 mins                    Blank cell = exercise not performed today    Manual Therapy Soft Tissue Mobilization: left glute, piriformis, IT band, and proximal hamstrings, STW/M performed to left glute, piriformis, IT band, and proximal hamstring to decrease pain and tone with pt in semi right side-lying    Modalities  Date:  Unattended Estim: Hip, IFC 80-150 Hz, 15 mins, Pain and Tone Hot Pack: Hip, 15 mins, Pain and Tone     HOME EXERCISE PROGRAM: Left SKTC stretch.  ASSESSMENT:  CLINICAL IMPRESSION: Pt arrives for today's treatment session reporting 6/10 left hip pain.  Pt attributes some pain to rainy weather today.  Pt able to tolerate Nustep for warmup today without increase in pain.  STW/M performed to left hip, IT band, piriformis, and glute to decrease pain and tone with pt in semi-right side-lying with good results.  Normal responses to estim and MH noted upon removal.  Pt reported 4/10 left hip pain at completion of today's treatment session.    OBJECTIVE IMPAIRMENTS Abnormal gait, decreased activity tolerance, decreased ROM, increased muscle spasms, and pain.   ACTIVITY LIMITATIONS standing and locomotion level  PARTICIPATION LIMITATIONS: meal prep, cleaning, laundry, and yard work  [employer]  POTENTIAL: Excellent  CLINICAL DECISION MAKING: Stable/uncomplicated  EVALUATION COMPLEXITY: Low   GOALS:  SHORT TERM GOALS: Target date: 06/21/2022  Independent with an HEP. Baseline: Goal status: INITIAL  LONG TERM GOALS: Target date: 07/19/2022  Sit to stand with pain not > 2-3/10. Baseline:  Goal status: INITIAL  2.  Stand 20 minutes with pain not > 3/10. Baseline:  Goal  status: INITIAL  3.  Walk a community distance with pain not > 3/10. Baseline:  Goal status: INITIAL  4.  Eliminate left LE symptoms. Baseline:  Goal status: INITIAL  PLAN: PT FREQUENCY: 2x/week  PT DURATION: 6 weeks  PLANNED INTERVENTIONS: Therapeutic exercises, Therapeutic activity, Neuromuscular re-education, Gait training, Patient/Family education, Self Care, Dry Needling, Electrical stimulation, Cryotherapy, Moist heat, Ultrasound, and Manual therapy.  PLAN FOR NEXT SESSION: Modalities and STW/M as needed\.  Core exercise progression.   Kathrynn Ducking, PTA 06/07/2022, 12:11 PM

## 2022-06-08 NOTE — Addendum Note (Signed)
Addended by: Janora Norlander on: 06/08/2022 02:34 PM   Modules accepted: Orders

## 2022-06-11 ENCOUNTER — Encounter: Payer: Self-pay | Admitting: Physical Therapy

## 2022-06-11 ENCOUNTER — Ambulatory Visit: Payer: Medicare Other | Admitting: Physical Therapy

## 2022-06-11 DIAGNOSIS — M5459 Other low back pain: Secondary | ICD-10-CM

## 2022-06-11 NOTE — Therapy (Addendum)
OUTPATIENT PHYSICAL THERAPY THORACOLUMBAR TREATMENT NOTE   Patient Name: Diana Pratt MRN: [id number] DOB:December 14, 1945, 76 y.o., female Today's Date: 06/11/2022   PT End of Session - 06/11/22 1018     Visit Number 4    Number of Visits 12    Date for PT Re-Evaluation 07/12/22    Authorization Type FOTO AT LEAST EVERY 5TH VISIT.  PROGRESS NOTE AT 10TH VISIT.  KX MODIFIER AFTER 15 VISITS.    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 Kenn File   Depression    Diabetes mellitus    Diabetic neuropathy ([place])    GERD (gastroesophageal reflux disease)    occ heartburn   Humerus fracture    right   Hyperlipemia    Hypertension    Interstitial cystitis    Osteoporosis    Pneumonia    15 yrs ago   PONV (postoperative nausea and vomiting)    Shortness of breath dyspnea    Past Surgical History:  Procedure Laterality Date   ABDOMINAL HYSTERECTOMY  03/1984   partial   COLONOSCOPY N/A 07/19/2014   Procedure: COLONOSCOPY;  Surgeon: Danie Binder, MD;  [facility];  Service: Endoscopy;  Laterality: N/A;  9:30   REVERSE SHOULDER ARTHROPLASTY Right 06/14/2016   Procedure: REVERSE SHOULDER ARTHROPLASTY;  Surgeon: Justice Britain, MD;  Location: [place];  Service: Orthopedics;  Laterality: Right;   Skin cancer removed  08/07/2016   left ear   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   Anxiety 07/26/2017   Vaginal dryness 04/10/2017   Vaginal atrophy 04/10/2017   Urinary incontinence 04/10/2017   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   Abdominal pain, acute, bilateral lower quadrant 07/01/2014   Transaminitis 07/01/2014   COPD (chronic obstructive pulmonary disease) ([place])    Asthma    Interstitial cystitis    Unspecified vitamin D deficiency 05/29/2013   Lumbar radiculopathy 05/29/2013   Back strain 05/29/2013   Unspecified asthma(493.90) 02/27/2013   Depression with anxiety 02/22/2011   Diabetes mellitus ([place]) 02/22/2011   Hyperlipidemia associated with type 2 diabetes mellitus ([place]) 02/22/2011   Hypertension associated with diabetes ([place]) 02/22/2011    REFERRING PROVIDER: Ronnie Doss DO  REFERRING DIAG: Chronic left-sided low back pain with left-sided sciatica.  Rationale for Evaluation and Treatment Rehabilitation  THERAPY DIAG:  Other low back pain  ONSET DATE: ~2 months.  SUBJECTIVE:  SUBJECTIVE STATEMENT: Pt arrives for today's treatment session reporting a 6/10 pain-level. PERTINENT HISTORY:  Right reverse total shoulder replacement, DM, HTN, OP.  PAIN:  Are you having pain? 6/10 left hip and buttock region     OBJECTIVE:      TODAY'S TREATMENT                                      EXERCISE LOG 06/11/22  Exercise Repetitions and Resistance Comments  Nustep Lvl 3 x 10 mins                    Blank cell = exercise not performed today    Right sdly position with folded pillow between knees:  Combo e'stim/US at 1.50 W/CM2 x 8 minutes to patient's left low back/SIJ region f/b STW/M x 6 minutes f/b gentle left femoral distraction with oscilliations and a sustained hold (2 minutes).   Modalities  Date:  Unattended Estim: Hip, IFC 80-150 Hz, 20 mins, Pain and Tone Hot Pack: Hip, 20 mins, Pain and Tone       ASSESSMENT:  CLINICAL IMPRESSION: Pt arrives for today's  treatment session reporting 6/10 left hip pain.  Patient stating she really enjoyed the combo e'stim/US treatment and reported a decrease in pain after treatment.    GOALS:  SHORT TERM GOALS: Target date: 06/25/2022  Independent with an HEP. Baseline: Goal status: INITIAL  LONG TERM GOALS: Target date: 07/23/2022  Sit to stand with pain not > 2-3/10. Baseline:  Goal status: INITIAL  2.  Stand 20 minutes with pain not > 3/10. Baseline:  Goal status: INITIAL  3.  Walk a community distance with pain not > 3/10. Baseline:  Goal status: INITIAL  4.  Eliminate left LE symptoms. Baseline:  Goal status: INITIAL  PLAN: PT FREQUENCY: 2x/week  PT DURATION: 6 weeks  PLANNED INTERVENTIONS: Therapeutic exercises, Therapeutic activity, Neuromuscular re-education, Gait training, Patient/Family education, Self Care, Dry Needling, Electrical stimulation, Cryotherapy, Moist heat, Ultrasound, and Manual therapy.  PLAN FOR NEXT SESSION: Modalities and STW/M as needed\.  Core exercise progression.   Diana Pratt, [place], PT 06/11/2022, 11:10 AM

## 2022-06-14 ENCOUNTER — Ambulatory Visit: Payer: Medicare Other

## 2022-06-14 DIAGNOSIS — M5459 Other low back pain: Secondary | ICD-10-CM | POA: Diagnosis not present

## 2022-06-14 NOTE — Therapy (Signed)
OUTPATIENT PHYSICAL THERAPY THORACOLUMBAR TREATMENT NOTE   Patient Name: Diana Pratt MRN: [id number] DOB:08/19/1946, 76 y.o., female Today's Date: 06/14/2022   PT End of Session - 06/14/22 0905     Visit Number 5    Number of Visits 12    Date for PT Re-Evaluation 07/12/22    Authorization Type FOTO AT LEAST EVERY 5TH VISIT.  PROGRESS NOTE AT 10TH VISIT.  KX MODIFIER AFTER 15 VISITS.    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 Kenn File   Depression    Diabetes mellitus    Diabetic neuropathy ([place])    GERD (gastroesophageal reflux disease)    occ heartburn   Humerus fracture    right   Hyperlipemia    Hypertension    Interstitial cystitis    Osteoporosis    Pneumonia    15 yrs ago   PONV (postoperative nausea and vomiting)    Shortness of breath dyspnea    Past Surgical History:  Procedure Laterality Date   ABDOMINAL HYSTERECTOMY  03/1984   partial   COLONOSCOPY N/A 07/19/2014   Procedure: COLONOSCOPY;  Surgeon: Danie Binder, MD;  [facility];  Service: Endoscopy;  Laterality: N/A;  9:30   REVERSE SHOULDER ARTHROPLASTY Right 06/14/2016   Procedure: REVERSE SHOULDER ARTHROPLASTY;  Surgeon: Justice Britain, MD;  Location: [place];  Service: Orthopedics;  Laterality: Right;   Skin cancer removed  08/07/2016   left ear   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   Anxiety 07/26/2017   Vaginal dryness 04/10/2017   Vaginal atrophy 04/10/2017   Urinary incontinence 04/10/2017   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   Abdominal pain, acute, bilateral lower quadrant 07/01/2014   Transaminitis 07/01/2014   COPD (chronic obstructive pulmonary disease) ([place])    Asthma    Interstitial cystitis    Unspecified vitamin D deficiency 05/29/2013   Lumbar radiculopathy 05/29/2013   Back strain 05/29/2013   Unspecified asthma(493.90) 02/27/2013   Depression with anxiety 02/22/2011   Diabetes mellitus ([place]) 02/22/2011   Hyperlipidemia associated with type 2 diabetes mellitus ([place]) 02/22/2011   Hypertension associated with diabetes ([place]) 02/22/2011    REFERRING PROVIDER: Ronnie Doss DO  REFERRING DIAG: Chronic left-sided low back pain with left-sided sciatica.  Rationale for Evaluation and Treatment Rehabilitation  THERAPY DIAG:  Other low back pain  ONSET DATE: ~2 months.  SUBJECTIVE:  SUBJECTIVE STATEMENT: Pt arrives for today's treatment session reporting a 6/10 pain-level.  Pt reports pain "shooting" down her LLE from left hip to the back of her left knee.   PERTINENT HISTORY:  Right reverse total shoulder replacement, DM, HTN, OP.  PAIN:  Are you having pain? 6/10 left hip and buttock region   OBJECTIVE:   TODAY'S TREATMENT                                      EXERCISE LOG 06/11/22  Exercise Repetitions and Resistance Comments  Nustep Lvl 4 x 10 mins                    Blank cell = exercise not performed today   Manual Therapy Soft Tissue Mobilization: left hip, STW/M to left piriformis, proximal hamstring, and IT band to decrease pain and tone with pt positioned in supine with wedge under BLE    Modalities  Date:  Unattended Estim: Hip, IFC 80-150 Hz, 20 mins, Pain and Tone Hot Pack: Hip, 20 mins, Pain and Tone     ASSESSMENT:  CLINICAL IMPRESSION: Pt arrives for  today's treatment session reporting 6/10 left hip and LE pain that radiates down behind her left knee.  STW/M performed to left piriformis, proximal hamstring, and IT band to decrease pain and tone with pt positioned in supine with wedge under BLE.  Normal responses to estim and MH noted upon removal.  Pt reported 4-5/10 left hip and LLE upon completion of today's treatment session.     GOALS:  SHORT TERM GOALS: Target date: 06/28/2022  Independent with an HEP. Baseline: Goal status: MET  LONG TERM GOALS: Target date: 07/26/2022  Sit to stand with pain not > 2-3/10. Baseline: 06/14/22: 7-8/10 Goal status: IN PROGRESS  2.  Stand 20 minutes with pain not > 3/10. Baseline: 06/14/22: 8/10 Goal status: IN PROGRESS  3.  Walk a community distance with pain not > 3/10. Baseline: 06/14/22: 8/10 Goal status: IN PROGRESS  4.  Eliminate left LE symptoms. Baseline:  Goal status: IN PROGRESS  PLAN: PT FREQUENCY: 2x/week  PT DURATION: 6 weeks  PLANNED INTERVENTIONS: Therapeutic exercises, Therapeutic activity, Neuromuscular re-education, Gait training, Patient/Family education, Self Care, Dry Needling, Electrical stimulation, Cryotherapy, Moist heat, Ultrasound, and Manual therapy.  PLAN FOR NEXT SESSION: Modalities and STW/M as needed\.  Core exercise progression.   Kathrynn Ducking, PTA 06/14/2022, 10:12 AM

## 2022-06-18 ENCOUNTER — Encounter: Payer: Self-pay | Admitting: Physical Therapy

## 2022-06-18 ENCOUNTER — Ambulatory Visit: Payer: Medicare Other | Admitting: Physical Therapy

## 2022-06-18 DIAGNOSIS — M5459 Other low back pain: Secondary | ICD-10-CM | POA: Diagnosis not present

## 2022-06-18 NOTE — Therapy (Signed)
OUTPATIENT PHYSICAL THERAPY THORACOLUMBAR TREATMENT NOTE   Patient Name: Diana Pratt MRN: [id number] DOB:1946/07/25, 76 y.o., female Today's Date: 06/18/2022   PT End of Session - 06/18/22 1023     Visit Number 6    Number of Visits 12    Date for PT Re-Evaluation 07/12/22    Authorization Type FOTO AT LEAST EVERY 5TH VISIT.  PROGRESS NOTE AT 10TH VISIT.  KX MODIFIER AFTER 15 VISITS.    PT Start Time 0902    PT Stop Time 0958    PT Time Calculation (min) 56 min    Activity Tolerance Patient tolerated treatment well    Behavior During Therapy 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 Kenn File   Depression    Diabetes mellitus    Diabetic neuropathy ([place])    GERD (gastroesophageal reflux disease)    occ heartburn   Humerus fracture    right   Hyperlipemia    Hypertension    Interstitial cystitis    Osteoporosis    Pneumonia    15 yrs ago   PONV (postoperative nausea and vomiting)    Shortness of breath dyspnea    Past Surgical History:  Procedure Laterality Date   ABDOMINAL HYSTERECTOMY  03/1984   partial   COLONOSCOPY N/A 07/19/2014   Procedure: COLONOSCOPY;  Surgeon: Danie Binder, MD;  [facility];  Service: Endoscopy;  Laterality: N/A;  9:30   REVERSE SHOULDER ARTHROPLASTY Right 06/14/2016   Procedure: REVERSE SHOULDER ARTHROPLASTY;  Surgeon: Justice Britain, MD;  Location: [facility];  Service: Orthopedics;  Laterality: Right;   Skin cancer removed  08/07/2016   left ear   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   Anxiety 07/26/2017   Vaginal dryness 04/10/2017   Vaginal atrophy 04/10/2017   Urinary incontinence 04/10/2017   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   Abdominal pain, acute, bilateral lower quadrant 07/01/2014   Transaminitis 07/01/2014   COPD (chronic obstructive pulmonary disease) ([place])    Asthma    Interstitial cystitis    Unspecified vitamin D deficiency 05/29/2013   Lumbar radiculopathy 05/29/2013   Back strain 05/29/2013   Unspecified asthma(493.90) 02/27/2013   Depression with anxiety 02/22/2011   Diabetes mellitus ([place]) 02/22/2011   Hyperlipidemia associated with type 2 diabetes mellitus ([place]) 02/22/2011   Hypertension associated with diabetes ([place]) 02/22/2011    REFERRING PROVIDER: Ronnie Doss DO  REFERRING DIAG: Chronic left-sided low back pain with left-sided sciatica.  Rationale for Evaluation and Treatment Rehabilitation  THERAPY DIAG:  Other low back pain  ONSET DATE: ~2 months.  SUBJECTIVE:  SUBJECTIVE STATEMENT: Pt arrives for today's treatment session reporting a 8/10 pain-level.  Pt reports pain "shooting" and "burning" down her LLE from left hip to the back of her left knee.   PERTINENT HISTORY:  Right reverse total shoulder replacement, DM, HTN, OP.  PAIN:  Are you having pain? 8/10 left hip and buttock region   OBJECTIVE:   TODAY'S TREATMENT                                      EXERCISE LOG 06/11/22  Exercise Repetitions and Resistance Comments  Nustep Lvl 4 x 11 mins                    Blank cell = exercise not performed today   Manual Therapy Soft Tissue Mobilization: left hip, SIJ STW/M to left piriformis, proximal hamstring  x 6 minutes.  Modalities Combo e'stim/US at 1.50 W/CM2 x 7 minutes to patient's left SIJ region. Unattended Estim: Hip, IFC 80-150 Hz, 20 mins, Pain and Tone Hot Pack: Hip, 20 mins, Pain and Tone     ASSESSMENT:  CLINICAL IMPRESSION: Pt arrives  for today's treatment session reporting 8/10 left hip and LE pain that radiates down behind her left knee described as "shooting and burning."  STW/M performed to left piriformis, proximal hamstring, SIJ which did make her feel better at the conclusion of her treatment.   GOALS:  SHORT TERM GOALS: Target date: 07/02/2022  Independent with an HEP. Baseline: Goal status: MET  LONG TERM GOALS: Target date: 07/30/2022  Sit to stand with pain not > 2-3/10. Baseline: 06/14/22: 7-8/10 Goal status: IN PROGRESS  2.  Stand 20 minutes with pain not > 3/10. Baseline: 06/14/22: 8/10 Goal status: IN PROGRESS  3.  Walk a community distance with pain not > 3/10. Baseline: 06/14/22: 8/10 Goal status: IN PROGRESS  4.  Eliminate left LE symptoms. Baseline:  Goal status: IN PROGRESS  PLAN: PT FREQUENCY: 2x/week  PT DURATION: 6 weeks  PLANNED INTERVENTIONS: Therapeutic exercises, Therapeutic activity, Neuromuscular re-education, Gait training, Patient/Family education, Self Care, Dry Needling, Electrical stimulation, Cryotherapy, Moist heat, Ultrasound, and Manual therapy.  PLAN FOR NEXT SESSION: Modalities and STW/M as needed\.  Core exercise progression.   Keaghan Bowens, [place], PT 06/18/2022, 10:24 AM

## 2022-06-20 ENCOUNTER — Telehealth: Payer: Self-pay

## 2022-06-20 NOTE — Chronic Care Management (AMB) (Signed)
  Chronic Care Management   Note  06/20/2022 Name: PANZY BUBECK MRN: [id number] DOB: 12/25/1945  Diana Pratt is a 76 y.o. year old female who is a primary care patient of Janora Norlander, DO. I reached out to [name] by phone today in response to a referral sent by Ms. Zenia Resides [place]'s PCP.  Ms. Tollett was given information about Chronic Care Management services today including:  CCM service includes personalized support from designated clinical staff supervised by her physician, including individualized plan of care and coordination with other care providers 24/7 contact phone numbers for assistance for urgent and routine care needs. Service will only be billed when office clinical staff spend 20 minutes or more in a month to coordinate care. Only one practitioner may furnish and bill the service in a calendar month. The patient may stop CCM services at any time (effective at the end of the month) by phone call to the office staff. The patient is responsible for co-pay (up to 20% after annual deductible is met) if co-pay is required by the individual health plan.   Patient agreed to services and verbal consent obtained.   Follow up plan: Telephone appointment with care management team member scheduled for:07/31/2022  Noreene Larsson, [place], [place] [address] Direct Dial: [phone] [email or web address]

## 2022-06-20 NOTE — Progress Notes (Signed)
Error

## 2022-06-21 ENCOUNTER — Ambulatory Visit: Payer: Medicare Other | Admitting: *Deleted

## 2022-06-21 ENCOUNTER — Encounter: Payer: Self-pay | Admitting: *Deleted

## 2022-06-21 DIAGNOSIS — M5459 Other low back pain: Secondary | ICD-10-CM | POA: Diagnosis not present

## 2022-06-21 NOTE — Therapy (Signed)
OUTPATIENT PHYSICAL THERAPY THORACOLUMBAR TREATMENT NOTE   Patient Name: Diana Pratt MRN: [id number] DOB:06/30/1946, 76 y.o., female Today's Date: 06/21/2022   PT End of Session - 06/21/22 1124     Visit Number 7    Number of Visits 12    Date for PT Re-Evaluation 07/12/22    Authorization Type FOTO AT LEAST EVERY 5TH VISIT.  PROGRESS NOTE AT 10TH VISIT.  KX MODIFIER AFTER 15 VISITS.    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 samuel bradshaw   Depression    Diabetes mellitus    Diabetic neuropathy ([facility])    GERD (gastroesophageal reflux disease)    occ heartburn   Humerus fracture    right   Hyperlipemia    Hypertension    Interstitial cystitis    Osteoporosis    Pneumonia    15 yrs ago   PONV (postoperative nausea and vomiting)    Shortness of breath dyspnea    Past Surgical History:  Procedure Laterality Date   ABDOMINAL HYSTERECTOMY  03/1984   partial   COLONOSCOPY N/A 07/19/2014   Procedure: COLONOSCOPY;  Surgeon: Sandi L Fields, MD;  [facility];  Service: Endoscopy;  Laterality: N/A;  9:30   REVERSE SHOULDER ARTHROPLASTY Right 06/14/2016   Procedure: REVERSE SHOULDER ARTHROPLASTY;  Surgeon: Kevin Supple, MD;  [facility];  Service: Orthopedics;  Laterality: Right;   Skin cancer removed  08/07/2016   left ear   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   Abdominal pain, acute, bilateral lower quadrant 07/01/2014   Transaminitis 07/01/2014   COPD  (chronic obstructive pulmonary disease) ([facility])    Asthma    Interstitial cystitis    Unspecified vitamin D deficiency 05/29/2013   Lumbar radiculopathy 05/29/2013   Back strain 05/29/2013   Unspecified asthma(493.90) 02/27/2013   Depression with anxiety 02/22/2011   Diabetes mellitus ([facility]) 02/22/2011   Hyperlipidemia associated with type 2 diabetes mellitus ([facility]) 02/22/2011   Hypertension associated with diabetes ([facility]) 02/22/2011    REFERRING PROVIDER: Ashly Gottschalk DO  REFERRING DIAG: Chronic left-sided low back pain with left-sided sciatica.  Rationale for Evaluation and Treatment Rehabilitation  THERAPY DIAG:  Other low back pain  ONSET DATE: ~2 months.  SUBJECTIVE:                                                                                                                                                                                             SUBJECTIVE STATEMENT: Pt arrives for today's treatment session reporting a 6/10 pain-level.  Pt reports pain "shooting" and "burning" down her LLE from left hip to the back of her left knee still and when trying to stand up.  PERTINENT HISTORY:  Right reverse total shoulder replacement, DM, HTN, OP.  PAIN:  Are you having pain? 6/10 left hip and buttock region   OBJECTIVE:   TODAY'S TREATMENT                                      EXERCISE LOG 06/21/22  Exercise Repetitions and Resistance Comments  Nustep Lvl 4 x 10 mins                    Blank cell = exercise not performed today   Manual Therapy  Soft Tissue Mobilization: left hip, SIJ STW/M to left piriformis,  x 6 minutes.  Modalities Combo e'stim/US at 1.50 W/CM2 x 10 minutes to patient's left SIJ region. Unattended Estim: Hip, IFC 80-150 Hz, 15 mins, Pain and Tone Hot Pack: Hip, 15 mins, Pain and Tone     ASSESSMENT:  CLINICAL IMPRESSION: Pt arrived today doing fair and reports PT is helping with decreased pain, but not lasting. Pt did well with Rx and  reports decreased pain end of session. She had notable tenderness LT LB paras, glute and piriformis area during STW. LTGs ongoing due to pain.    GOALS:  SHORT TERM GOALS: Target date: 07/05/2022  Independent with an HEP. Baseline: Goal status: MET  LONG TERM GOALS: Target date: 08/02/2022  Sit to stand with pain not > 2-3/10. Baseline: 06/14/22: 7-8/10 Goal status: IN PROGRESS  2.  Stand 20 minutes with pain not > 3/10. Baseline: 06/14/22: 8/10 Goal status: IN PROGRESS  3.  Walk a community distance with pain not > 3/10. Baseline: 06/14/22: 8/10 Goal status: IN PROGRESS  4.  Eliminate left LE symptoms. Baseline:  Goal status: IN PROGRESS  PLAN: PT FREQUENCY: 2x/week  PT DURATION: 6 weeks  PLANNED INTERVENTIONS: Therapeutic exercises, Therapeutic activity, Neuromuscular re-education, Gait training, Patient/Family education, Self Care, Dry Needling, Electrical stimulation, Cryotherapy, Moist heat, Ultrasound, and Manual therapy.  PLAN FOR NEXT SESSION: Modalities and STW/M as needed\.  Core exercise progression.   RAMSEUR,CHRIS, PTA 06/21/2022, 12:08 PM  

## 2022-06-25 ENCOUNTER — Encounter: Payer: Self-pay | Admitting: Physical Therapy

## 2022-06-25 ENCOUNTER — Ambulatory Visit: Payer: Medicare Other | Admitting: Physical Therapy

## 2022-06-25 DIAGNOSIS — M5459 Other low back pain: Secondary | ICD-10-CM | POA: Diagnosis not present

## 2022-06-25 NOTE — Therapy (Signed)
OUTPATIENT PHYSICAL THERAPY THORACOLUMBAR TREATMENT NOTE   Patient Name: Diana Pratt MRN: [id number] DOB:07-09-1946, 76 y.o., female Today's Date: 06/25/2022   PT End of Session - 06/25/22 0949     Visit Number 8    Number of Visits 12    Date for PT Re-Evaluation 07/12/22    Authorization Type FOTO AT LEAST EVERY 5TH VISIT.  PROGRESS NOTE AT 10TH VISIT.  KX MODIFIER AFTER 15 VISITS.    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 Kenn File   Depression    Diabetes mellitus    Diabetic neuropathy ([place])    GERD (gastroesophageal reflux disease)    occ heartburn   Humerus fracture    right   Hyperlipemia    Hypertension    Interstitial cystitis    Osteoporosis    Pneumonia    15 yrs ago   PONV (postoperative nausea and vomiting)    Shortness of breath dyspnea    Past Surgical History:  Procedure Laterality Date   ABDOMINAL HYSTERECTOMY  03/1984   partial   COLONOSCOPY N/A 07/19/2014   Procedure: COLONOSCOPY;  Surgeon: Danie Binder, MD;  [facility];  Service: Endoscopy;  Laterality: N/A;  9:30   REVERSE SHOULDER ARTHROPLASTY Right 06/14/2016   Procedure: REVERSE SHOULDER ARTHROPLASTY;  Surgeon: Justice Britain, MD;  Location: [facility];  Service: Orthopedics;  Laterality: Right;   Skin cancer removed  08/07/2016   left ear   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   Anxiety 07/26/2017   Vaginal dryness 04/10/2017   Vaginal atrophy 04/10/2017   Urinary incontinence 04/10/2017   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   Abdominal pain, acute, bilateral lower quadrant 07/01/2014   Transaminitis 07/01/2014   COPD (chronic obstructive pulmonary disease) ([place])    Asthma    Interstitial cystitis    Unspecified vitamin D deficiency 05/29/2013   Lumbar radiculopathy 05/29/2013   Back strain 05/29/2013   Unspecified asthma(493.90) 02/27/2013   Depression with anxiety 02/22/2011   Diabetes mellitus ([place]) 02/22/2011   Hyperlipidemia associated with type 2 diabetes mellitus ([place]) 02/22/2011   Hypertension associated with diabetes ([place]) 02/22/2011    REFERRING PROVIDER: Ronnie Doss DO  REFERRING DIAG: Chronic left-sided low back pain with left-sided sciatica.  Rationale for Evaluation and Treatment Rehabilitation  THERAPY DIAG:  Other low back pain  ONSET DATE: ~2 months.  SUBJECTIVE:  SUBJECTIVE STATEMENT: Pt arrives for today's treatment session reporting a 5/10 pain-level.  Last treatment helped. PERTINENT HISTORY:  Right reverse total shoulder replacement, DM, HTN, OP.  PAIN:  Are you having pain? 6/10 left hip and buttock region   OBJECTIVE:   TODAY'S TREATMENT                                      EXERCISE LOG 06/25/22  Exercise Repetitions and Resistance Comments  Nustep Lvl 4 x 15 mins                    Blank cell = exercise not performed today    Modalities Combo e'stim/US at 1.50 W/CM2 x 8 minutes to patient's left SIJ region. Unattended Estim: Hip, IFC 80-150 Hz, 20 mins, Pain and Tone Hot Pack: Hip, 15 mins, Pain and Tone     ASSESSMENT:  CLINICAL IMPRESSION: Pt reports PT is helping with decreased pain, esp after last treatment. Pt did well with Rx and reports decreased pain end of session. She finds combo e'stim particularly helpful. GOALS:  SHORT TERM  GOALS: Target date: 07/09/2022  Independent with an HEP. Baseline: Goal status: MET  LONG TERM GOALS: Target date: 08/06/2022  Sit to stand with pain not > 2-3/10. Baseline: 06/14/22: 7-8/10 Goal status: IN PROGRESS  2.  Stand 20 minutes with pain not > 3/10. Baseline: 06/14/22: 8/10 Goal status: IN PROGRESS  3.  Walk a community distance with pain not > 3/10. Baseline: 06/14/22: 8/10 Goal status: IN PROGRESS  4.  Eliminate left LE symptoms. Baseline:  Goal status: IN PROGRESS  PLAN: PT FREQUENCY: 2x/week  PT DURATION: 6 weeks  PLANNED INTERVENTIONS: Therapeutic exercises, Therapeutic activity, Neuromuscular re-education, Gait training, Patient/Family education, Self Care, Dry Needling, Electrical stimulation, Cryotherapy, Moist heat, Ultrasound, and Manual therapy.  PLAN FOR NEXT SESSION: Modalities and STW/M as needed\.  Core exercise progression.   Zaydin Billey, [place], PT 06/25/2022, 10:14 AM

## 2022-06-28 ENCOUNTER — Ambulatory Visit: Payer: Medicare Other

## 2022-06-28 DIAGNOSIS — M5459 Other low back pain: Secondary | ICD-10-CM

## 2022-06-28 NOTE — Therapy (Signed)
OUTPATIENT PHYSICAL THERAPY THORACOLUMBAR TREATMENT NOTE   Patient Name: Diana Pratt MRN: [id number] DOB:09/08/1946, 76 y.o., female Today's Date: 06/28/2022   PT End of Session - 06/28/22 0904     Visit Number 9    Number of Visits 12    Date for PT Re-Evaluation 07/12/22    Authorization Type FOTO AT LEAST EVERY 5TH VISIT.  PROGRESS NOTE AT 10TH VISIT.  KX MODIFIER AFTER 15 VISITS.    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 samuel bradshaw   Depression    Diabetes mellitus    Diabetic neuropathy ([facility])    GERD (gastroesophageal reflux disease)    occ heartburn   Humerus fracture    right   Hyperlipemia    Hypertension    Interstitial cystitis    Osteoporosis    Pneumonia    15 yrs ago   PONV (postoperative nausea and vomiting)    Shortness of breath dyspnea    Past Surgical History:  Procedure Laterality Date   ABDOMINAL HYSTERECTOMY  03/1984   partial   COLONOSCOPY N/A 07/19/2014   Procedure: COLONOSCOPY;  Surgeon: Sandi L Fields, MD;  [facility];  Service: Endoscopy;  Laterality: N/A;  9:30   REVERSE SHOULDER ARTHROPLASTY Right 06/14/2016   Procedure: REVERSE SHOULDER ARTHROPLASTY;  Surgeon: Kevin Supple, MD;  [facility];  Service: Orthopedics;  Laterality: Right;   Skin cancer removed  08/07/2016   left ear   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   Anxiety 07/26/2017   Vaginal dryness 04/10/2017   Vaginal atrophy 04/10/2017   Urinary incontinence 04/10/2017   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   Abdominal pain, acute, bilateral lower quadrant 07/01/2014   Transaminitis 07/01/2014   COPD (chronic obstructive pulmonary disease) ([facility])    Asthma    Interstitial cystitis    Unspecified vitamin D deficiency 05/29/2013   Lumbar radiculopathy 05/29/2013   Back strain 05/29/2013   Unspecified asthma(493.90) 02/27/2013   Depression with anxiety 02/22/2011   Diabetes mellitus ([facility]) 02/22/2011   Hyperlipidemia associated with type 2 diabetes mellitus ([facility]) 02/22/2011   Hypertension associated with diabetes ([facility]) 02/22/2011    REFERRING PROVIDER: Ashly Gottschalk DO  REFERRING DIAG: Chronic left-sided low back pain with left-sided sciatica.  Rationale for Evaluation and Treatment Rehabilitation  THERAPY DIAG:  Other low back pain  ONSET DATE: ~2 months.  SUBJECTIVE:                                                                                                                                                                                             SUBJECTIVE STATEMENT: Pt arrives for today's treatment session reporting a 5/10 pain-level.  Reports relief with combo.   PERTINENT HISTORY:  Right reverse total shoulder replacement, DM, HTN, OP.  PAIN:  Are you having pain? 5/10 left hip    OBJECTIVE:   TODAY'S TREATMENT                                      EXERCISE LOG 06/25/22  Exercise Repetitions and Resistance Comments  Nustep Lvl 4 x 15 mins                    Blank cell = exercise not performed today    Modalities Combo e'stim/US at 1.50 W/CM2 x 8 minutes to patient's left SIJ region. Unattended Estim: Hip, IFC 80-150 Hz, 20 mins, Pain and Tone Hot Pack: Hip, 15 mins, Pain and Tone     ASSESSMENT:  CLINICAL IMPRESSION: Pt arrives for today's treatment session reporting 5/10 left hip pain.  Pt reports good results with combo at previous treatment session.  Pt reports performing HEP as previously instructed.  Normal responses noted  with all modalities performed.  Pt reported 3/10 left hip pain at completion of today's treatment session.   GOALS:  SHORT TERM GOALS: Target date: 07/12/2022  Independent with an HEP. Baseline: Goal status: MET  LONG TERM GOALS: Target date: 08/09/2022  Sit to stand with pain not > 2-3/10. Baseline: 06/14/22: 7-8/10 Goal status: IN PROGRESS  2.  Stand 20 minutes with pain not > 3/10. Baseline: 06/14/22: 8/10 Goal status: IN PROGRESS  3.  Walk a community distance with pain not > 3/10. Baseline: 06/14/22: 8/10 Goal status: IN PROGRESS  4.  Eliminate left LE symptoms. Baseline:  Goal status: IN PROGRESS  PLAN: PT FREQUENCY: 2x/week  PT DURATION: 6 weeks  PLANNED INTERVENTIONS: Therapeutic exercises, Therapeutic activity, Neuromuscular re-education, Gait training, Patient/Family education, Self Care, Dry Needling, Electrical stimulation, Cryotherapy, Moist heat, Ultrasound, and Manual therapy.  PLAN FOR NEXT SESSION: Modalities and STW/M as needed\.  Core exercise progression.   Kathrynn Ducking, PTA 06/28/2022, 9:59 AM

## 2022-07-02 ENCOUNTER — Ambulatory Visit: Payer: Medicare Other

## 2022-07-02 DIAGNOSIS — M5459 Other low back pain: Secondary | ICD-10-CM | POA: Diagnosis not present

## 2022-07-02 NOTE — Therapy (Addendum)
OUTPATIENT PHYSICAL THERAPY THORACOLUMBAR TREATMENT NOTE   Patient Name: Diana Pratt MRN: [id number] DOB:Mar 06, 1946, 76 y.o., female Today's Date: 07/02/2022   PT End of Session - 07/02/22 0903     Visit Number 10    Number of Visits 12    Date for PT Re-Evaluation 07/12/22    Authorization Type --    PT Start Time 0900    PT Stop Time 1000    PT Time Calculation (min) 60 min    Activity Tolerance Patient tolerated treatment well    Behavior During Therapy [facility] for tasks assessed/performed             Past Medical History:  Diagnosis Date   Anxiety    Asthma    Back pain    Cancer ([place]) 2017   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 Kenn File   Depression    Diabetes mellitus    Diabetic neuropathy ([place])    GERD (gastroesophageal reflux disease)    occ heartburn   Humerus fracture    right   Hyperlipemia    Hypertension    Interstitial cystitis    Osteoporosis    Pneumonia    15 yrs ago   PONV (postoperative nausea and vomiting)    Shortness of breath dyspnea    Past Surgical History:  Procedure Laterality Date   ABDOMINAL HYSTERECTOMY  03/1984   partial   COLONOSCOPY N/A 07/19/2014   Procedure: COLONOSCOPY;  Surgeon: Danie Binder, MD;  [facility];  Service: Endoscopy;  Laterality: N/A;  9:30   REVERSE SHOULDER ARTHROPLASTY Right 06/14/2016   Procedure: REVERSE SHOULDER ARTHROPLASTY;  Surgeon: Justice Britain, MD;  Location: [place];  Service: Orthopedics;  Laterality: Right;   Skin cancer removed  08/07/2016   left ear   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   Abdominal pain, acute, bilateral lower quadrant 07/01/2014    Transaminitis 07/01/2014   COPD (chronic obstructive pulmonary disease) ([place])    Asthma    Interstitial cystitis    Unspecified vitamin D deficiency 05/29/2013   Lumbar radiculopathy 05/29/2013   Back strain 05/29/2013   Unspecified asthma(493.90) 02/27/2013   Depression with anxiety 02/22/2011   Diabetes mellitus ([place]) 02/22/2011   Hyperlipidemia associated with type 2 diabetes mellitus ([place]) 02/22/2011   Hypertension associated with diabetes ([place]) 02/22/2011    REFERRING PROVIDER: Ronnie Doss DO  REFERRING DIAG: Chronic left-sided low back pain with left-sided sciatica.  Rationale for Evaluation and Treatment Rehabilitation  THERAPY DIAG:  Other low back pain  ONSET DATE: ~2 months.  SUBJECTIVE:  SUBJECTIVE STATEMENT: Pt arrives for today's treatment session reporting a 4/10 pain-level.    PERTINENT HISTORY:  Right reverse total shoulder replacement, DM, HTN, OP.  PAIN:  Are you having pain? 4/10 left hip    OBJECTIVE:   TODAY'S TREATMENT                                EXERCISE LOG 07/02/22  Exercise Repetitions and Resistance Comments  Nustep Lvl 4 x 15 mins   LAQ 3# x 20 reps BIL   Seated Marching 3# x 20 reps BIL   Ball Squeezes 2 mins   Hip Abduction Red t-band x 2 mins   Ham Curls Red t-band x 20 reps BIL    Blank cell = exercise not performed today   Manual Therapy Soft Tissue Mobilization: right hip, IASTM to right with pt in left side-lying to decrease pain and tone   Modalities Unattended Estim: Hip, IFC 80-150 Hz, 20 mins, Pain and Tone Hot Pack: Hip, 15 mins, Pain and Tone     ASSESSMENT:  CLINICAL IMPRESSION: Pt arrives for today's treatment session reporting 4/10 left hip pain.  Pt instructed in seated BLE exercises to increase strength and function.   Pt requiring min cues for proper technique and posture.  Normal responses to estim and MH noted upon removal.  Pt is making progress towards all of her goals at this time.  Pt reported 2/10 left hip pain upon completion of today's treatment session.  Pt would continue to benefit from skilled therapy and current POC to address limitation and deficits.   GOALS:  SHORT TERM GOALS: Target date: 07/16/2022  Independent with an HEP. Baseline: Goal status: MET  LONG TERM GOALS: Target date: 08/13/2022  Sit to stand with pain not > 2-3/10. Baseline: 06/14/22: 7-8/10; 07/02/22: 5/10  Goal status: IN PROGRESS  2.  Stand 20 minutes with pain not > 3/10. Baseline: 06/14/22: 8/10; 07/02/22: can stand approx. 10 mins before requiring to sit Goal status: IN PROGRESS  3.  Walk a community distance with pain not > 3/10. Baseline: 06/14/22: 8/10; 07/02/22: 6-7/10  Goal status: IN PROGRESS  4.  Eliminate left LE symptoms. Baseline:  Goal status: IN PROGRESS  PLAN: PT FREQUENCY: 2x/week  PT DURATION: 6 weeks  PLANNED INTERVENTIONS: Therapeutic exercises, Therapeutic activity, Neuromuscular re-education, Gait training, Patient/Family education, Self Care, Dry Needling, Electrical stimulation, Cryotherapy, Moist heat, Ultrasound, and Manual therapy.  PLAN FOR NEXT SESSION: Modalities and STW/M as needed\.  Core exercise progression.   Kathrynn Ducking, PTA 07/02/2022, 10:08 AM   Progress Note Reporting Period 05/31/22 to 07/02/22.  See note below for Objective Data and Assessment of Progress/Goals. Patient has reported improvement since starting PT.   Goals ongoing at [facility] time.    Mali Applegate MPT

## 2022-07-05 ENCOUNTER — Ambulatory Visit: Payer: Medicare Other | Admitting: Physical Therapy

## 2022-07-05 DIAGNOSIS — M5459 Other low back pain: Secondary | ICD-10-CM

## 2022-07-05 NOTE — Therapy (Signed)
OUTPATIENT PHYSICAL THERAPY THORACOLUMBAR TREATMENT NOTE   Patient Name: Diana Pratt MRN: [id number] DOB:[birth year], 76 y.o., female Today's Date: 07/05/2022   PT End of Session - 07/05/22 1205     Visit Number 11    Number of Visits 18    Date for PT Re-Evaluation 08/02/22    PT Start Time 0903    PT Stop Time 0953    PT Time Calculation (min) 50 min    Activity Tolerance Patient tolerated treatment well    Behavior During Therapy [facility] for tasks assessed/performed             Past Medical History:  Diagnosis Date   Anxiety    Asthma    Back pain    Cancer ([place]) 2017   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 Kenn File   Depression    Diabetes mellitus    Diabetic neuropathy ([place])    GERD (gastroesophageal reflux disease)    occ heartburn   Humerus fracture    right   Hyperlipemia    Hypertension    Interstitial cystitis    Osteoporosis    Pneumonia    15 yrs ago   PONV (postoperative nausea and vomiting)    Shortness of breath dyspnea    Past Surgical History:  Procedure Laterality Date   ABDOMINAL HYSTERECTOMY  03/1984   partial   COLONOSCOPY N/A 07/19/2014   Procedure: COLONOSCOPY;  Surgeon: Danie Binder, MD;  [facility];  Service: Endoscopy;  Laterality: N/A;  9:30   REVERSE SHOULDER ARTHROPLASTY Right 06/14/2016   Procedure: REVERSE SHOULDER ARTHROPLASTY;  Surgeon: Justice Britain, MD;  Location: [place];  Service: Orthopedics;  Laterality: Right;   Skin cancer removed  08/07/2016   left ear   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   Abdominal pain, acute, bilateral lower quadrant 07/01/2014   Transaminitis 07/01/2014    COPD (chronic obstructive pulmonary disease) ([place])    Asthma    Interstitial cystitis    Unspecified vitamin D deficiency 05/29/2013   Lumbar radiculopathy 05/29/2013   Back strain 05/29/2013   Unspecified asthma(493.90) 02/27/2013   Depression with anxiety 02/22/2011   Diabetes mellitus ([place]) 02/22/2011   Hyperlipidemia associated with type 2 diabetes mellitus ([place]) 02/22/2011   Hypertension associated with diabetes ([place]) 02/22/2011    REFERRING PROVIDER: Ronnie Doss DO  REFERRING DIAG: Chronic left-sided low back pain with left-sided sciatica.  Rationale for Evaluation and Treatment Rehabilitation  THERAPY DIAG:  Other low back pain  ONSET DATE: ~2 months.  SUBJECTIVE:  SUBJECTIVE STATEMENT: Pt arrives for today's treatment session reporting a 4/10 pain-level.    PERTINENT HISTORY:  Right reverse total shoulder replacement, DM, HTN, OP.  PAIN:  Are you having pain? 4/10 left hip    OBJECTIVE:   TODAY'S TREATMENT                                EXERCISE LOG 07/02/22  Exercise Repetitions and Resistance Comments  Nustep Lvl 4 x 15 mins                        Blank cell = exercise not performed today      Modalities: Combo e'stim/US at 1.50 W/CM2 x 8 minutes. Unattended Estim: Hip, IFC 80-150 Hz, 20 mins, Pain and Tone Hot Pack: Hip, 15 mins, Pain and Tone     ASSESSMENT:  CLINICAL IMPRESSION: Patient overall states she is about "40% better."  She is pleased and would like to continue.  Very good response to tx today and feeling better following.  Normal modality response following removal of modality.  SHORT TERM GOALS: Target date: 07/19/2022  Independent with an HEP. Baseline: Goal status: MET  LONG TERM GOALS: Target date: 08/16/2022  Sit to stand with pain  not > 2-3/10. Baseline: 06/14/22: 7-8/10; 07/02/22: 5/10  Goal status: IN PROGRESS  2.  Stand 20 minutes with pain not > 3/10. Baseline: 06/14/22: 8/10; 07/02/22: can stand approx. 10 mins before requiring to sit Goal status: IN PROGRESS  3.  Walk a community distance with pain not > 3/10. Baseline: 06/14/22: 8/10; 07/02/22: 6-7/10  Goal status: IN PROGRESS  4.  Eliminate left LE symptoms. Baseline:  Goal status: IN PROGRESS  PLAN: PT FREQUENCY: 2x/week  PT DURATION: 6 weeks  PLANNED INTERVENTIONS: Therapeutic exercises, Therapeutic activity, Neuromuscular re-education, Gait training, Patient/Family education, Self Care, Dry Needling, Electrical stimulation, Cryotherapy, Moist heat, Ultrasound, and Manual therapy.  PLAN FOR NEXT SESSION: Modalities and STW/M as needed\.  Core exercise progression.   Irlanda Croghan, [place], PT 07/05/2022, 12:06 PM

## 2022-07-06 ENCOUNTER — Telehealth: Payer: Self-pay | Admitting: Family Medicine

## 2022-07-06 NOTE — Telephone Encounter (Signed)
Pt appt cancelled and r/s

## 2022-07-06 NOTE — Telephone Encounter (Signed)
Yes, finish up therapy first.  Ok to extend appt out a few more weeks.

## 2022-07-10 ENCOUNTER — Encounter: Payer: Self-pay | Admitting: Physical Therapy

## 2022-07-10 ENCOUNTER — Ambulatory Visit: Payer: Medicare Other | Attending: Family Medicine | Admitting: Physical Therapy

## 2022-07-10 ENCOUNTER — Ambulatory Visit (INDEPENDENT_AMBULATORY_CARE_PROVIDER_SITE_OTHER): Payer: Medicare Other

## 2022-07-10 VITALS — Wt 194.0 lb

## 2022-07-10 DIAGNOSIS — Z0000 Encounter for general adult medical examination without abnormal findings: Secondary | ICD-10-CM

## 2022-07-10 DIAGNOSIS — M5459 Other low back pain: Secondary | ICD-10-CM

## 2022-07-10 NOTE — Therapy (Signed)
OUTPATIENT PHYSICAL THERAPY THORACOLUMBAR TREATMENT NOTE   Patient Name: Diana Pratt MRN: [id number] DOB:November 28, 1945, 76 y.o., female Today's Date: 07/10/2022   PT End of Session - 07/10/22 0914     Visit Number 12    Number of Visits 18    Date for PT Re-Evaluation 08/02/22    Authorization Type FOTO AT LEAST EVERY 5TH VISIT.  PROGRESS NOTE AT 10TH VISIT.  KX MODIFIER AFTER 15 VISITS.    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 Kenn File   Depression    Diabetes mellitus    Diabetic neuropathy ([place])    GERD (gastroesophageal reflux disease)    occ heartburn   Humerus fracture    right   Hyperlipemia    Hypertension    Interstitial cystitis    Osteoporosis    Pneumonia    15 yrs ago   PONV (postoperative nausea and vomiting)    Shortness of breath dyspnea    Past Surgical History:  Procedure Laterality Date   ABDOMINAL HYSTERECTOMY  03/1984   partial   COLONOSCOPY N/A 07/19/2014   Procedure: COLONOSCOPY;  Surgeon: Danie Binder, MD;  [facility];  Service: Endoscopy;  Laterality: N/A;  9:30   REVERSE SHOULDER ARTHROPLASTY Right 06/14/2016   Procedure: REVERSE SHOULDER ARTHROPLASTY;  Surgeon: Justice Britain, MD;  Location: [place];  Service: Orthopedics;  Laterality: Right;   Skin cancer removed  08/07/2016   left ear   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   Anxiety 07/26/2017   Vaginal dryness 04/10/2017   Vaginal atrophy 04/10/2017   Urinary incontinence 04/10/2017   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   Abdominal pain, acute, bilateral lower quadrant 07/01/2014   Transaminitis 07/01/2014   COPD (chronic obstructive pulmonary disease) ([place])    Asthma    Interstitial cystitis    Unspecified vitamin D deficiency 05/29/2013   Lumbar radiculopathy 05/29/2013   Back strain 05/29/2013   Unspecified asthma(493.90) 02/27/2013   Depression with anxiety 02/22/2011   Diabetes mellitus ([place]) 02/22/2011   Hyperlipidemia associated with type 2 diabetes mellitus ([place]) 02/22/2011   Hypertension associated with diabetes ([place]) 02/22/2011    REFERRING PROVIDER: Ronnie Doss DO  REFERRING DIAG: Chronic left-sided low back pain with left-sided sciatica.  Rationale for Evaluation and Treatment Rehabilitation  THERAPY DIAG:  Other low back pain  ONSET DATE: ~2 months.  SUBJECTIVE:  SUBJECTIVE STATEMENT: Patient reports continued soreness and ache of the posterior hip area.  PERTINENT HISTORY:  Right reverse total shoulder replacement, DM, HTN, OP.  PAIN:  Are you having pain? 4/10 left hip. Soreness and ache   OBJECTIVE:   TODAY'S TREATMENT                                EXERCISE LOG 07/02/22  Exercise Repetitions and Resistance Comments  Nustep Lvl 4 x 15 mins                        Blank cell = exercise not performed today      Modalities: Combo e'stim/US at 1.50 W/CM2 x 10 minutes. Unattended Estim: Hip, IFC 80-150 Hz, 15 mins, Pain and Tone Hot Pack: Hip, 15 mins, Pain and Tone     ASSESSMENT:  CLINICAL IMPRESSION: Patient presented in clinic with soreness and ache in proximal HS region. Patient notes that pain is worse upon standing after being seated for a length of time. Patient's proximal R HS assessed with palpable tightness. Normal modalities response  noted following removal of the modalities.  SHORT TERM GOALS: Target date: 07/24/2022  Independent with an HEP. Baseline: Goal status: MET  LONG TERM GOALS: Target date: 08/21/2022  Sit to stand with pain not > 2-3/10. Baseline: 06/14/22: 7-8/10; 07/02/22: 5/10  Goal status: IN PROGRESS  2.  Stand 20 minutes with pain not > 3/10. Baseline: 06/14/22: 8/10; 07/02/22: can stand approx. 10 mins before requiring to sit Goal status: IN PROGRESS  3.  Walk a community distance with pain not > 3/10. Baseline: 06/14/22: 8/10; 07/02/22: 6-7/10  Goal status: IN PROGRESS  4.  Eliminate left LE symptoms. Baseline:  Goal status: IN PROGRESS  PLAN: PT FREQUENCY: 2x/week  PT DURATION: 6 weeks  PLANNED INTERVENTIONS: Therapeutic exercises, Therapeutic activity, Neuromuscular re-education, Gait training, Patient/Family education, Self Care, Dry Needling, Electrical stimulation, Cryotherapy, Moist heat, Ultrasound, and Manual therapy.  PLAN FOR NEXT SESSION: Modalities and STW/M as needed\.  Core exercise progression.   Standley Brooking, PTA 07/10/2022, 10:21 AM

## 2022-07-10 NOTE — Progress Notes (Signed)
Subjective:   Diana Pratt is a 76 y.o. female who presents for Medicare Annual (Subsequent) preventive examination.  Virtual Visit via Telephone Note  I connected with  Diana Pratt on 07/10/22 at  4:15 PM EDT by telephone and verified that I am speaking with the correct person using two identifiers.  Location: Patient: Home Provider: [facility] Persons participating in the virtual visit: patient/Nurse Health Advisor   I discussed the limitations, risks, security and privacy concerns of performing an evaluation and management service by telephone and the availability of in person appointments. The patient expressed understanding and agreed to proceed.  Interactive audio and video telecommunications were attempted between this nurse and patient, however failed, due to patient having technical difficulties OR patient did not have access to video capability.  We continued and completed visit with audio only.  Some vital signs may be absent or patient reported.   Fidencia Mccloud E Nayda Riesen, LPN   Review of Systems     Cardiac Risk Factors include: advanced age (>36mn, >>100women);diabetes mellitus;dyslipidemia;hypertension;obesity (BMI >30kg/m2);Other (see comment), Risk factor comments: COPD     Objective:    Today's Vitals   07/10/22 1618  Weight: 194 lb (88 kg)  PainSc: 5    Body mass index is 33.3 kg/m.     07/10/2022    4:24 PM 05/31/2022    9:29 AM 07/07/2021    2:13 PM 07/06/2020    2:02 PM 09/03/2019    8:18 PM 07/06/2019    3:28 PM 06/05/2017    2:39 PM  Advanced Directives  Does Patient Have a Medical Advance Directive? _0  No No  Would patient like information on creating a medical advance directive? No - Patient declined  No - Patient declined No - Patient declined  No - Patient declined Yes (MAU/Ambulatory/Procedural Areas - Information given)    Current Medications (verified) Outpatient Encounter Medications as of 07/10/2022  Medication Sig   Accu-Chek Softclix  Lancets lancets Check blood sugar 3 times daily Dx E11.40   albuterol (PROAIR HFA) 108 (90 Base) MCG/ACT inhaler Inhale 2 puffs into the lungs every 6 (six) hours as needed for shortness of breath.   amLODipine (NORVASC) 10 MG tablet Take 1 tablet (10 mg total) by mouth daily.   aspirin 81 MG EC tablet Take 81 mg by mouth daily.   blood glucose meter kit and supplies Dispense based on patient and insurance preference. Use up to four times daily as directed. (FOR ICD-10 E10.9, E11.9).   Blood Glucose Monitoring Suppl (ACCU-CHEK GUIDE) w/Device KIT Use to check blood sugar up to TID and as needed. DX E11.49   budesonide-formoterol (SYMBICORT) 160-4.5 MCG/ACT inhaler Inhale 2 puffs into the lungs 2 (two) times daily.   calcium carbonate 200 MG capsule Take 250 mg by mouth daily.   Cholecalciferol (VITAMIN D3) 2000 UNITS capsule Take 2,000 Units by mouth daily.    conjugated estrogens (PREMARIN) vaginal cream Apply 0.5gm vaginally 3 days per week.   FLUoxetine (PROZAC) 10 MG capsule Take 3 capsules (30 mg total) by mouth daily.   fluticasone (FLONASE) 50 MCG/ACT nasal spray Place 2 sprays into the nose daily as needed for allergies. Congestion    glucose blood (ACCU-CHEK GUIDE) test strip Use to check BG up to tid.  Dx: type 2 diabetes requiring insulin therapy E11.40   hydrochlorothiazide (HYDRODIURIL) 25 MG tablet Take 1 tablet (25 mg total) by mouth daily.   hydroxypropyl methylcellulose (ISOPTO TEARS) 2.5 % ophthalmic solution  Place 1 drop into both eyes daily as needed. for dry eyes   insulin glargine (LANTUS SOLOSTAR) 100 UNIT/ML Solostar Pen Inject 42 Units into the skin 2 (two) times daily.   Insulin Pen Needle (PEN NEEDLES) 31G X 6 MM MISC Use to administer insulin once qid. Dx E11.59 Use Leader brand   LORazepam (ATIVAN) 1 MG tablet Take 1/2-1 tablet daily if needed for anxiety.  May repeat dose once if needed during the day. PUT ON FILE   metoprolol succinate (TOPROL-XL) 100 MG 24 hr tablet  Take 1 tablet (100 mg total) by mouth daily. Take with or immediately following a meal.   Multiple Vitamins-Minerals (CENTRUM SILVER PO) Take 1 tablet by mouth daily.   omeprazole (PRILOSEC) 20 MG capsule Take 1 capsule (20 mg total) by mouth daily.   Semaglutide, 1 MG/DOSE, (OZEMPIC, 1 MG/DOSE,) 4 MG/3ML SOPN INJECT 1 MG SUBCUTANEOUSLY ONCE A WEEK - ADMINISTER BY SUBCUTANEOUS INJECTION INTO THE ABDOMEN, THIGH, OR UPPER ARM AT ANY TIME OF DAY ON THE SAME DAY EACH WEEK) DISCARD PEN 56 DAYS AFTER FIRST USE   tiZANidine (ZANAFLEX) 4 MG tablet Take 0.5-1 tablets (2-4 mg total) by mouth every 8 (eight) hours as needed for muscle spasms.   valsartan (DIOVAN) 320 MG tablet Take 1 tablet (320 mg total) by mouth daily.   No facility-administered encounter medications on file as of 07/10/2022.    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 Kenn File   Depression    Diabetes mellitus    Diabetic neuropathy ([place])    GERD (gastroesophageal reflux disease)    occ heartburn   Humerus fracture    right   Hyperlipemia    Hypertension    Interstitial cystitis    Osteoporosis    Pneumonia    15 yrs ago   PONV (postoperative nausea and vomiting)    Shortness of breath dyspnea    Past Surgical History:  Procedure Laterality Date   ABDOMINAL HYSTERECTOMY  03/1984   partial   COLONOSCOPY N/A 07/19/2014   Procedure: COLONOSCOPY;  Surgeon: Danie Binder, MD;  [facility];  Service: Endoscopy;  Laterality: N/A;  9:30   REVERSE SHOULDER ARTHROPLASTY Right 06/14/2016   Procedure: REVERSE SHOULDER ARTHROPLASTY;  Surgeon: Justice Britain, MD;   Location: [facility];  Service: Orthopedics;  Laterality: Right;   Skin cancer removed  08/07/2016   left ear   Family History  Problem Relation Age of Onset   Parkinsonism Mother    Dementia Mother    Colon cancer Father 58       MULTIPLE CO-MORBIDITIES   Cancer Father    Diabetes Paternal Aunt    Diabetes Paternal Uncle    Diabetes Paternal Grandmother    Congestive Heart Failure Paternal Grandfather    Congestive Heart Failure Maternal Grandmother    Stroke Maternal Grandfather    Colon polyps Neg Hx    Social History   Socioeconomic History   Marital status: Married    Spouse name: [name]   Number of children: 1   Years of education: 13   Highest education level: Some college, no degree  Occupational  History   Occupation: retired     Comment: CNA   Tobacco Use   Smoking status: Former    Packs/day: 0.50    Years: 34.00    Total pack years: 17.00    Types: Cigarettes    Quit date: 06/01/1994    Years since quitting: 28.1   Smokeless tobacco: Never  Vaping Use   Vaping Use: Never used  Substance and Sexual Activity   Alcohol use: No   Drug use: No   Sexual activity: Yes    Birth control/protection: Surgical    Comment: hyst  Other Topics Concern   Not on file  Social History Narrative   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 [place]   Social Determinants of Health   Financial Resource Strain: Low Risk  (07/10/2022)   Overall Financial Resource Strain (CARDIA)    Difficulty of Paying Living Expenses: Not hard at all  Food Insecurity: No Food Insecurity (07/10/2022)   Hunger Vital Sign    Worried About Running Out of Food in the Last Year: Never true    Ran Out of Food in the Last Year: Never true  Transportation Needs: No Transportation Needs (07/10/2022)   PRAPARE - Hydrologist (Medical): No    Lack of Transportation (Non-Medical): No  Physical Activity: Sufficiently Active (07/10/2022)    Exercise Vital Sign    Days of Exercise per Week: 3 days    Minutes of Exercise per Session: 60 min  Stress: No Stress Concern Present (07/10/2022)   [place]    Feeling of Stress : Not at all  Social Connections: [place] (07/10/2022)   Social Connection and Isolation Panel [NHANES]    Frequency of Communication with Friends and Family: More than three times a week    Frequency of Social Gatherings with Friends and Family: More than three times a week    Attends Religious Services: More than 4 times per year    Active Member of [employer] or Organizations: Yes    Attends Music therapist: More than 4 times per year    Marital Status: Married    Tobacco Counseling Counseling given: Not Answered   Clinical Intake:  Pre-visit preparation completed: Yes  Pain : 0-10 Pain Score: 5  Pain Location: Back Pain Orientation: Left Pain Descriptors / Indicators: Aching, Shooting, Discomfort Pain Onset: More than a month ago Pain Frequency: Intermittent     BMI - recorded: 33.3 Nutritional Status: BMI > 30  Obese Nutritional Risks: None Diabetes: No  How often do you need to have someone help you when you read instructions, pamphlets, or other written materials from your doctor or pharmacy?: 1 - Never  Diabetic? Nutrition Risk Assessment:  Has the patient had any N/V/D within the last 2 months?  No  Does the patient have any non-healing wounds?  No  Has the patient had any unintentional weight loss or weight gain?  No   Diabetes:  Is the patient diabetic?  Yes  If diabetic, was a CBG obtained today?  No  Did the patient bring in their glucometer from home?  No  How often do you monitor your CBG's? BID - 127 this am per patient.   Financial Strains and Diabetes Management:  Are you having any financial strains with the device, your supplies or your medication? No .  Does the patient want to  be seen  by Chronic Care Management for management of their diabetes?  No  Would the patient like to be referred to a Nutritionist or for Diabetic Management?  No   Diabetic Exams:  Diabetic Eye Exam: Completed 12/06/2021.   Diabetic Foot Exam: Completed 09/25/2021. Pt has been advised about the importance in completing this exam. Pt is scheduled for diabetic foot exam on 10/01/2022.    Interpreter Needed?: No  Information entered by :: Sheilla Maris, LPN   Activities of Daily Living    07/10/2022    4:23 PM  In your present state of health, do you have any difficulty performing the following activities:  Hearing? 0  Vision? 0  Difficulty concentrating or making decisions? 0  Walking or climbing stairs? 1  Dressing or bathing? 0  Doing errands, shopping? 0  Preparing Food and eating ? N  Using the Toilet? N  In the past six months, have you accidently leaked urine? N  Do you have problems with loss of bowel control? N  Managing your Medications? N  Managing your Finances? N  Housekeeping or managing your Housekeeping? Y  Comment pays someone    Patient Care Team: Janora Norlander, DO as PCP - General (Family Medicine) Danie Binder, MD (Inactive) as Consulting Physician (Gastroenterology) Druscilla Brownie, MD as Consulting Physician (Dermatology) Justice Britain, MD as Consulting Physician (Orthopedic Surgery) Estill Dooms, NP as Nurse Practitioner (Obstetrics and Gynecology) Lavera Guise, [facility] (Pharmacist)  Indicate any recent Medical Services you may have received from other than [facility] providers in the past year (date may be approximate).     Assessment:   This is a routine wellness examination for Diana Pratt.  Hearing/Vision screen Hearing Screening - Comments:: Denies hearing difficulties   Vision Screening - Comments:: Wears rx glasses - up to date with routine eye exams with [facility] in [place] issues and exercise activities  discussed: Current Exercise Habits: Home exercise routine, Type of exercise: strength training/weights;stretching;walking, Time (Minutes): 60, Frequency (Times/Week): 3, Weekly Exercise (Minutes/Week): 180, Intensity: Mild, Exercise limited by: orthopedic condition(s)   Goals Addressed             This Visit's Progress    Prevent falls   On track    Stay active Work on eating a better diet. 07/10/22 -Hopes her lower back/sciatica gets better       Depression Screen    07/10/2022    4:28 PM 09/25/2021    1:39 PM 07/07/2021    2:11 PM 05/25/2021    1:27 PM 01/23/2021    1:10 PM 10/25/2020    1:08 PM 10/03/2020    4:19 PM  PHQ 2/9 Scores  PHQ - 2 Score 1 1 0 0 0 0 0  PHQ- 9 Score  _0 0    Fall Risk    07/10/2022    4:20 PM 07/07/2021    2:25 PM 05/25/2021    1:27 PM 01/23/2021    1:10 PM 10/25/2020    1:08 PM  Fall Risk   Falls in the past year? 0  0 0 0  Number falls in past yr: 0 0     Injury with Fall? 0 0     Risk for fall due to : Orthopedic patient Orthopedic patient;Impaired vision     Follow up Falls prevention discussed;Education provided Falls prevention discussed       FALL RISK PREVENTION PERTAINING TO THE HOME:  Any stairs in or around  the home? Yes  If so, are there any without handrails? No  Home free of loose throw rugs in walkways, pet beds, electrical cords, etc? Yes  Adequate lighting in your home to reduce risk of falls? Yes   ASSISTIVE DEVICES UTILIZED TO PREVENT FALLS:  Life alert? No  Use of a cane, walker or w/c? No  Grab bars in the bathroom? No  Shower chair or bench in shower? No  Elevated toilet seat or a handicapped toilet? No   TIMED UP AND GO:  Was the test performed? No . Telephonic visit  Cognitive Function:    05/31/2016   10:56 AM  MMSE - Mini Mental State Exam  Orientation to time 5  Orientation to Place 5  Registration 3  Attention/ Calculation 5  Recall 3  Language- name 2 objects 2  Language- repeat 1  Language-  follow 3 step command 3  Language- read & follow direction 1  Write a sentence 1  Copy design 1  Total score 30        07/10/2022    4:24 PM 07/06/2020    2:08 PM 07/06/2019    3:32 PM  6CIT Screen  What Year? 0 points 0 points 0 points  What month? 0 points 0 points 0 points  What time? 0 points 0 points 0 points  Count back from 20 0 points 2 points 0 points  Months in reverse 0 points 0 points 0 points  Repeat phrase 0 points 0 points 2 points  Total Score 0 points 2 points 2 points    Immunizations Immunization History  Administered Date(s) Administered   DTaP 01/04/2003   Pneumococcal Conjugate-13 09/21/2019   Pneumococcal Polysaccharide-23 06/30/2018    TDAP status: Due, Education has been provided regarding the importance of this vaccine. Advised may receive this vaccine at local pharmacy or Health Dept. Aware to provide a copy of the vaccination record if obtained from local pharmacy or Health Dept. Verbalized acceptance and understanding.  Flu Vaccine status: Declined, Education has been provided regarding the importance of this vaccine but patient still declined. Advised may receive this vaccine at local pharmacy or Health Dept. Aware to provide a copy of the vaccination record if obtained from local pharmacy or Health Dept. Verbalized acceptance and understanding.  Pneumococcal vaccine status: Up to date  Covid-19 vaccine status: Declined, Education has been provided regarding the importance of this vaccine but patient still declined. Advised may receive this vaccine at local pharmacy or Health Dept.or vaccine clinic. Aware to provide a copy of the vaccination record if obtained from local pharmacy or Health Dept. Verbalized acceptance and understanding.  Qualifies for Shingles Vaccine? Yes   Zostavax completed No   Shingrix Completed?: No.    Education has been provided regarding the importance of this vaccine. Patient has been advised to call insurance company to  determine out of pocket expense if they have not yet received this vaccine. Advised may also receive vaccine at local pharmacy or Health Dept. Verbalized acceptance and understanding.  Screening Tests Health Maintenance  Topic Date Due   COVID-19 Vaccine (1) Never done   INFLUENZA VACCINE  Never done   Zoster Vaccines- Shingrix (1 of 2) 08/25/2022 (Originally 03/05/1965)   TETANUS/TDAP  01/25/2023 (Originally 01/03/2013)   DEXA SCAN  05/26/2023 (Originally 06/06/2019)   FOOT EXAM  09/25/2022   HEMOGLOBIN A1C  11/25/2022   OPHTHALMOLOGY EXAM  12/06/2022   Pneumonia Vaccine 52+ Years old  Completed   Hepatitis C Screening  Completed   HPV VACCINES  Aged Out   COLONOSCOPY (Pts 45-47yr Insurance coverage will need to be confirmed)  Discontinued    Health Maintenance  Health Maintenance Due  Topic Date Due   COVID-19 Vaccine (1) Never done   INFLUENZA VACCINE  Never done    Colorectal cancer screening: Type of screening: Colonoscopy. Completed 07/19/2014. Repeat every 5-10 years  Mammogram status: No longer required due to she declines.  Bone Density status: Completed 06/05/2017. Results reflect: Bone density results: OSTEOPENIA. Repeat every 2 years. She declines at this time  Lung Cancer Screening: (Low Dose CT Chest recommended if Age 76-80years, 30 pack-year currently smoking OR have quit w/in 15years.) does not qualify.  Additional Screening:  Hepatitis C Screening: does qualify; Completed 07/05/2014  Vision Screening: Recommended annual ophthalmology exams for early detection of glaucoma and other disorders of the eye. Is the patient up to date with their annual eye exam?  Yes  Who is the provider or what is the name of the office in which the patient attends annual eye exams? [facility] pt is not established with a provider, would they like to be referred to a provider to establish care? No .   Dental Screening: Recommended annual dental exams for proper oral  hygiene  Community Resource Referral / Chronic Care Management: CRR required this visit?  No   CCM required this visit?  No      Plan:     I have personally reviewed and noted the following in the patient's chart:   Medical and social history Use of alcohol, tobacco or illicit drugs  Current medications and supplements including opioid prescriptions. Patient is not currently taking opioid prescriptions. Functional ability and status Nutritional status Physical activity Advanced directives List of other physicians Hospitalizations, surgeries, and ER visits in previous 12 months Vitals Screenings to include cognitive, depression, and falls Referrals and appointments  In addition, I have reviewed and discussed with patient certain preventive protocols, quality metrics, and best practice recommendations. A written personalized care plan for preventive services as well as general preventive health recommendations were provided to patient.     ASandrea Hammond LPN   91/04/1095  Nurse Notes: None

## 2022-07-10 NOTE — Patient Instructions (Signed)
Diana Pratt , Thank you for taking time to come for your Medicare Wellness Visit. I appreciate your ongoing commitment to your health goals. Please review the following plan we discussed and let me know if I can assist you in the future.   Screening recommendations/referrals: Colonoscopy: Done 07/19/2014 - Repeat in 5-10 years  Mammogram: Declined - recommended annually Bone Density: Done 06/05/2017 - Repeat every 2 years *due Recommended yearly ophthalmology/optometry visit for glaucoma screening and checkup Recommended yearly dental visit for hygiene and checkup  Vaccinations: Influenza vaccine: Declined - recommended annually Pneumococcal vaccine: Done  06/30/2018 & 09/21/2019 Tdap vaccine: Due - Recommended every 10 years  Shingles vaccine: Due - Shingrix is 2 doses 2-6 months apart and over 90% effective     Covid-19: Declined   Advanced directives: Advance directive discussed with you today. Even though you declined this today, please call our office should you change your mind, and we can give you the proper paperwork for you to fill out.   Conditions/risks identified: Aim for 30 minutes of exercise or brisk walking, 6-8 glasses of water, and 5 servings of fruits and vegetables each day.   Next appointment: Follow up in one year for your annual wellness visit    Preventive Care 65 Years and Older, Female Preventive care refers to lifestyle choices and visits with your health care provider that can promote health and wellness. What does preventive care include? A yearly physical exam. This is also called an annual well check. Dental exams once or twice a year. Routine eye exams. Ask your health care provider how often you should have your eyes checked. Personal lifestyle choices, including: Daily care of your teeth and gums. Regular physical activity. Eating a healthy diet. Avoiding tobacco and drug use. Limiting alcohol use. Practicing safe sex. Taking low-dose aspirin every  day. Taking vitamin and mineral supplements as recommended by your health care provider. What happens during an annual well check? The services and screenings done by your health care provider during your annual well check will depend on your age, overall health, lifestyle risk factors, and family history of disease. Counseling  Your health care provider may ask you questions about your: Alcohol use. Tobacco use. Drug use. Emotional well-being. Home and relationship well-being. Sexual activity. Eating habits. History of falls. Memory and ability to understand (cognition). Work and work Statistician. Reproductive health. Screening  You may have the following tests or measurements: Height, weight, and BMI. Blood pressure. Lipid and cholesterol levels. These may be checked every 5 years, or more frequently if you are over 31 years old. Skin check. Lung cancer screening. You may have this screening every year starting at age 40 if you have a 30-pack-year history of smoking and currently smoke or have quit within the past 15 years. Fecal occult blood test (FOBT) of the stool. You may have this test every year starting at age 49. Flexible sigmoidoscopy or colonoscopy. You may have a sigmoidoscopy every 5 years or a colonoscopy every 10 years starting at age 49. Hepatitis C blood test. Hepatitis B blood test. Sexually transmitted disease (STD) testing. Diabetes screening. This is done by checking your blood sugar (glucose) after you have not eaten for a while (fasting). You may have this done every 1-3 years. Bone density scan. This is done to screen for osteoporosis. You may have this done starting at age 33. Mammogram. This may be done every 1-2 years. Talk to your health care provider about how often you should have  regular mammograms. Talk with your health care provider about your test results, treatment options, and if necessary, the need for more tests. Vaccines  Your health care  provider may recommend certain vaccines, such as: Influenza vaccine. This is recommended every year. Tetanus, diphtheria, and acellular pertussis (Tdap, Td) vaccine. You may need a Td booster every 10 years. Zoster vaccine. You may need this after age 44. Pneumococcal 13-valent conjugate (PCV13) vaccine. One dose is recommended after age 22. Pneumococcal polysaccharide (PPSV23) vaccine. One dose is recommended after age 47. Talk to your health care provider about which screenings and vaccines you need and how often you need them. This information is not intended to replace advice given to you by your health care provider. Make sure you discuss any questions you have with your health care provider. Document Released: 11/18/2015 Document Revised: 07/11/2016 Document Reviewed: 08/23/2015 Elsevier Interactive Patient Education  2017 [place] Prevention in the Home Falls can cause injuries. They can happen to people of all ages. There are many things you can do to make your home safe and to help prevent falls. What can I do on the outside of my home? Regularly fix the edges of walkways and driveways and fix any cracks. Remove anything that might make you trip as you walk through a door, such as a raised step or threshold. Trim any bushes or trees on the path to your home. Use bright outdoor lighting. Clear any walking paths of anything that might make someone trip, such as rocks or tools. Regularly check to see if handrails are loose or broken. Make sure that both sides of any steps have handrails. Any raised decks and porches should have guardrails on the edges. Have any leaves, snow, or ice cleared regularly. Use sand or salt on walking paths during winter. Clean up any spills in your garage right away. This includes oil or grease spills. What can I do in the bathroom? Use night lights. Install grab bars by the toilet and in the tub and shower. Do not use towel bars as grab  bars. Use non-skid mats or decals in the tub or shower. If you need to sit down in the shower, use a plastic, non-slip stool. Keep the floor dry. Clean up any water that spills on the floor as soon as it happens. Remove soap buildup in the tub or shower regularly. Attach bath mats securely with double-sided non-slip rug tape. Do not have throw rugs and other things on the floor that can make you trip. What can I do in the bedroom? Use night lights. Make sure that you have a light by your bed that is easy to reach. Do not use any sheets or blankets that are too big for your bed. They should not hang down onto the floor. Have a firm chair that has side arms. You can use this for support while you get dressed. Do not have throw rugs and other things on the floor that can make you trip. What can I do in the kitchen? Clean up any spills right away. Avoid walking on wet floors. Keep items that you use a lot in easy-to-reach places. If you need to reach something above you, use a strong step stool that has a grab bar. Keep electrical cords out of the way. Do not use floor polish or wax that makes floors slippery. If you must use wax, use non-skid floor wax. Do not have throw rugs and other things on the floor that  can make you trip. What can I do with my stairs? Do not leave any items on the stairs. Make sure that there are handrails on both sides of the stairs and use them. Fix handrails that are broken or loose. Make sure that handrails are as long as the stairways. Check any carpeting to make sure that it is firmly attached to the stairs. Fix any carpet that is loose or worn. Avoid having throw rugs at the top or bottom of the stairs. If you do have throw rugs, attach them to the floor with carpet tape. Make sure that you have a light switch at the top of the stairs and the bottom of the stairs. If you do not have them, ask someone to add them for you. What else can I do to help prevent  falls? Wear shoes that: Do not have high heels. Have rubber bottoms. Are comfortable and fit you well. Are closed at the toe. Do not wear sandals. If you use a stepladder: Make sure that it is fully opened. Do not climb a closed stepladder. Make sure that both sides of the stepladder are locked into place. Ask someone to hold it for you, if possible. Clearly mark and make sure that you can see: Any grab bars or handrails. First and last steps. Where the edge of each step is. Use tools that help you move around (mobility aids) if they are needed. These include: Canes. Walkers. Scooters. Crutches. Turn on the lights when you go into a dark area. Replace any light bulbs as soon as they burn out. Set up your furniture so you have a clear path. Avoid moving your furniture around. If any of your floors are uneven, fix them. If there are any pets around you, be aware of where they are. Review your medicines with your doctor. Some medicines can make you feel dizzy. This can increase your chance of falling. Ask your doctor what other things that you can do to help prevent falls. This information is not intended to replace advice given to you by your health care provider. Make sure you discuss any questions you have with your health care provider. Document Released: 08/18/2009 Document Revised: 03/29/2016 Document Reviewed: 11/26/2014 Elsevier Interactive Patient Education  2017 [employer].

## 2022-07-13 ENCOUNTER — Ambulatory Visit: Payer: Medicare Other | Admitting: Family Medicine

## 2022-07-16 ENCOUNTER — Ambulatory Visit: Payer: Medicare Other

## 2022-07-16 DIAGNOSIS — M5459 Other low back pain: Secondary | ICD-10-CM

## 2022-07-16 NOTE — Therapy (Signed)
OUTPATIENT PHYSICAL THERAPY THORACOLUMBAR TREATMENT NOTE   Patient Name: Diana Pratt MRN: [id number] DOB:Nov 04, 1946, 76 y.o., female Today's Date: 07/16/2022   PT End of Session - 07/16/22 0906     Visit Number 13    Number of Visits 18    Date for PT Re-Evaluation 08/02/22    Authorization Type FOTO AT LEAST EVERY 5TH VISIT.  PROGRESS NOTE AT 10TH VISIT.  KX MODIFIER AFTER 15 VISITS.    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 Kenn File   Depression    Diabetes mellitus    Diabetic neuropathy ([place])    GERD (gastroesophageal reflux disease)    occ heartburn   Humerus fracture    right   Hyperlipemia    Hypertension    Interstitial cystitis    Osteoporosis    Pneumonia    15 yrs ago   PONV (postoperative nausea and vomiting)    Shortness of breath dyspnea    Past Surgical History:  Procedure Laterality Date   ABDOMINAL HYSTERECTOMY  03/1984   partial   COLONOSCOPY N/A 07/19/2014   Procedure: COLONOSCOPY;  Surgeon: Danie Binder, MD;  [facility];  Service: Endoscopy;  Laterality: N/A;  9:30   REVERSE SHOULDER ARTHROPLASTY Right 06/14/2016   Procedure: REVERSE SHOULDER ARTHROPLASTY;  Surgeon: Justice Britain, MD;  Location: [place];  Service: Orthopedics;  Laterality: Right;   Skin cancer removed  08/07/2016   left ear   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   Anxiety 07/26/2017   Vaginal dryness 04/10/2017   Vaginal atrophy 04/10/2017   Urinary incontinence 04/10/2017   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   Abdominal pain, acute, bilateral lower quadrant 07/01/2014   Transaminitis 07/01/2014   COPD (chronic obstructive pulmonary disease) ([place])    Asthma    Interstitial cystitis    Unspecified vitamin D deficiency 05/29/2013   Lumbar radiculopathy 05/29/2013   Back strain 05/29/2013   Unspecified asthma(493.90) 02/27/2013   Depression with anxiety 02/22/2011   Diabetes mellitus ([place]) 02/22/2011   Hyperlipidemia associated with type 2 diabetes mellitus ([place]) 02/22/2011   Hypertension associated with diabetes ([place]) 02/22/2011    REFERRING PROVIDER: Ronnie Doss DO  REFERRING DIAG: Chronic left-sided low back pain with left-sided sciatica.  Rationale for Evaluation and Treatment Rehabilitation  THERAPY DIAG:  Other low back pain  ONSET DATE: ~2 months.  SUBJECTIVE:  SUBJECTIVE STATEMENT: Patient reports that she is still having pain in her back that radiates down to her left knee. She also heels stiff today.   PERTINENT HISTORY:  Right reverse total shoulder replacement, DM, HTN, OP.  PAIN:  Are you having pain? 5/10 left hip. Soreness and ache   OBJECTIVE:   TODAY'S TREATMENT                                     9/11 EXERCISE LOG  Exercise Repetitions and Resistance Comments  Nustep  L4 x 15 minutes   Ball roll out 20 reps Limited by her familiar pain   Isometric ball press 2 minutes w/ 5 second hold   LAQ  4# x 3 minutes   Seated HS stretch 4 x 30 seconds   Seated HS isometric 3 minutes w/ 5 second hold   Seated hip ADD isometric  30 reps w/ 5 second hold    Blank cell = exercise not performed today  Modalities: no redness or adverse reaction to today's modalities  Date:  Unattended Estim: left hamstring, pre mod @ 80-150 Hz, 15 mins, Pain                               EXERCISE LOG 07/02/22  Exercise Repetitions and Resistance Comments  Nustep Lvl 4 x 15 mins                        Blank cell = exercise not performed today      Modalities: Combo e'stim/US at 1.50 W/CM2 x 10 minutes. Unattended Estim: Hip, IFC 80-150 Hz, 15 mins, Pain and Tone Hot Pack: Hip, 15 mins, Pain and Tone     ASSESSMENT:  CLINICAL IMPRESSION: Patient was introduced to multiple new interventions for improved lumbar and lower extremity strength. She required minimal cueing with seated hamstring stretch to facilitate improved soft tissue extensibility. She experienced a mild increase in her familiar pain with standing ball roll outs which resulted in this activity being held at this time. She reported feeling a little better upon the conclusion of treatment. She continues to require skilled physical therapy to address her remaining impairments to maximize her functional mobility.   SHORT TERM GOALS: Target date: 07/30/2022  Independent with an HEP. Baseline: Goal status: MET  LONG TERM GOALS: Target date: 08/27/2022  Sit to stand with pain not > 2-3/10. Baseline: 06/14/22: 7-8/10; 07/02/22: 5/10  Goal status: IN PROGRESS  2.  Stand 20 minutes with pain not > 3/10. Baseline: 06/14/22: 8/10; 07/02/22: can stand approx. 10 mins before requiring to sit Goal status: IN PROGRESS  3.  Walk a community distance with pain not > 3/10. Baseline: 06/14/22: 8/10; 07/02/22: 6-7/10  Goal status: IN PROGRESS  4.  Eliminate left LE symptoms. Baseline:  Goal status: IN PROGRESS  PLAN: PT FREQUENCY: 2x/week  PT DURATION: 6 weeks  PLANNED INTERVENTIONS: Therapeutic exercises, Therapeutic activity, Neuromuscular re-education, Gait training, Patient/Family education, Self Care, Dry Needling, Electrical stimulation, Cryotherapy, Moist heat, Ultrasound, and Manual therapy.  PLAN FOR NEXT SESSION: Modalities and STW/M as needed\.  Core exercise progression.   Darlin Coco, PT 07/16/2022, 12:22 PM

## 2022-07-19 ENCOUNTER — Ambulatory Visit: Payer: Medicare Other | Admitting: Physical Therapy

## 2022-07-19 DIAGNOSIS — M5459 Other low back pain: Secondary | ICD-10-CM

## 2022-07-19 NOTE — Therapy (Signed)
OUTPATIENT PHYSICAL THERAPY THORACOLUMBAR TREATMENT NOTE   Patient Name: Diana Pratt MRN: [id number] DOB:Mar 06, 1946, 76 y.o., female Today's Date: 07/19/2022   PT End of Session - 07/19/22 0931     Visit Number 14    Number of Visits 18    Date for PT Re-Evaluation 08/02/22    Authorization Type FOTO AT LEAST EVERY 5TH VISIT.  PROGRESS NOTE AT 10TH VISIT.  KX MODIFIER AFTER 15 VISITS.    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 Kenn File   Depression    Diabetes mellitus    Diabetic neuropathy ([place])    GERD (gastroesophageal reflux disease)    occ heartburn   Humerus fracture    right   Hyperlipemia    Hypertension    Interstitial cystitis    Osteoporosis    Pneumonia    15 yrs ago   PONV (postoperative nausea and vomiting)    Shortness of breath dyspnea    Past Surgical History:  Procedure Laterality Date   ABDOMINAL HYSTERECTOMY  03/1984   partial   COLONOSCOPY N/A 07/19/2014   Procedure: COLONOSCOPY;  Surgeon: Danie Binder, MD;  [facility];  Service: Endoscopy;  Laterality: N/A;  9:30   REVERSE SHOULDER ARTHROPLASTY Right 06/14/2016   Procedure: REVERSE SHOULDER ARTHROPLASTY;  Surgeon: Justice Britain, MD;  Location: [place];  Service: Orthopedics;  Laterality: Right;   Skin cancer removed  08/07/2016   left ear   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   Anxiety 07/26/2017   Vaginal dryness 04/10/2017   Vaginal atrophy 04/10/2017   Urinary incontinence 04/10/2017   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   Abdominal pain, acute, bilateral lower quadrant 07/01/2014   Transaminitis 07/01/2014   COPD (chronic obstructive pulmonary disease) ([place])    Asthma    Interstitial cystitis    Unspecified vitamin D deficiency 05/29/2013   Lumbar radiculopathy 05/29/2013   Back strain 05/29/2013   Unspecified asthma(493.90) 02/27/2013   Depression with anxiety 02/22/2011   Diabetes mellitus ([place]) 02/22/2011   Hyperlipidemia associated with type 2 diabetes mellitus ([place]) 02/22/2011   Hypertension associated with diabetes ([place]) 02/22/2011    REFERRING PROVIDER: Ronnie Doss DO  REFERRING DIAG: Chronic left-sided low back pain with left-sided sciatica.  Rationale for Evaluation and Treatment Rehabilitation  THERAPY DIAG:  Other low back pain  ONSET DATE: ~2 months.  SUBJECTIVE:  SUBJECTIVE STATEMENT: Patient reports that she is still having pain in her back that radiates down to her left knee.  PERTINENT HISTORY:  Right reverse total shoulder replacement, DM, HTN, OP.  PAIN:  Are you having pain? 5/10 left hip. Soreness and ache   OBJECTIVE:   TODAY'S TREATMENT                                   Trigger Point Dry-Needling  Treatment instructions: Expect mild to moderate muscle soreness. S/S of pneumothorax if dry needled over a lung field, and to seek immediate medical attention should they occur. Patient verbalized understanding of these instructions and education.  Patient Consent Given: Yes Education handout provided: Yes Muscles treated: Left TFL and glut med  Treatment response/outcome: Excellent response.  Date: 07/19/22: STW/M x 10 minutes to patient's left lateral hip musculature to decrease tone. HMP and IFC at 80-150 Hz on 40% scan x 20 minutes to patient's left  lateral hip musculature   ASSESSMENT:  CLINICAL IMPRESSION: Patient tolerated dry needling very well to her left TFL and glut med. SHORT TERM GOALS: Target date: 07/30/2022  Independent with an HEP. Baseline: Goal status: MET  LONG TERM GOALS: Target date: 08/27/2022  Sit to stand with pain not > 2-3/10. Baseline: 06/14/22: 7-8/10; 07/02/22: 5/10  Goal status: IN PROGRESS  2.  Stand 20 minutes with pain not > 3/10. Baseline: 06/14/22: 8/10; 07/02/22: can stand approx. 10 mins before requiring to sit Goal status: IN PROGRESS  3.  Walk a community distance with pain not > 3/10. Baseline: 06/14/22: 8/10; 07/02/22: 6-7/10  Goal status: IN PROGRESS  4.  Eliminate left LE symptoms. Baseline:  Goal status: IN PROGRESS  PLAN: PT FREQUENCY: 2x/week  PT DURATION: 6 weeks  PLANNED INTERVENTIONS: Therapeutic exercises, Therapeutic activity, Neuromuscular re-education, Gait training, Patient/Family education, Self Care, Dry Needling, Electrical stimulation, Cryotherapy, Moist heat, Ultrasound, and Manual therapy.  PLAN FOR NEXT SESSION: Modalities and STW/M as needed\.  Core exercise progression.   APPLEGATE, CHAD, PT 07/19/2022, 10:10 AM    

## 2022-07-24 ENCOUNTER — Ambulatory Visit: Payer: Medicare Other | Admitting: Physical Therapy

## 2022-07-24 ENCOUNTER — Encounter: Payer: Self-pay | Admitting: Physical Therapy

## 2022-07-24 DIAGNOSIS — M5459 Other low back pain: Secondary | ICD-10-CM

## 2022-07-24 NOTE — Therapy (Signed)
OUTPATIENT PHYSICAL THERAPY THORACOLUMBAR TREATMENT NOTE   Patient Name: Diana Pratt MRN: [id number] DOB:[birth year], 76 y.o., female Today's Date: 07/24/2022   PT End of Session - 07/24/22 1030     Visit Number 15    Number of Visits 18    Date for PT Re-Evaluation 08/02/22    Authorization Type FOTO AT LEAST EVERY 5TH VISIT.  PROGRESS NOTE AT 10TH VISIT.  KX MODIFIER AFTER 15 VISITS.    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 Kenn File   Depression    Diabetes mellitus    Diabetic neuropathy ([place])    GERD (gastroesophageal reflux disease)    occ heartburn   Humerus fracture    right   Hyperlipemia    Hypertension    Interstitial cystitis    Osteoporosis    Pneumonia    15 yrs ago   PONV (postoperative nausea and vomiting)    Shortness of breath dyspnea    Past Surgical History:  Procedure Laterality Date   ABDOMINAL HYSTERECTOMY  03/1984   partial   COLONOSCOPY N/A 07/19/2014   Procedure: COLONOSCOPY;  Surgeon: Danie Binder, MD;  [facility];  Service: Endoscopy;  Laterality: N/A;  9:30   REVERSE SHOULDER ARTHROPLASTY Right 06/14/2016   Procedure: REVERSE SHOULDER ARTHROPLASTY;  Surgeon: Justice Britain, MD;  Location: [place];  Service: Orthopedics;  Laterality: Right;   Skin cancer removed  08/07/2016   left ear   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   Anxiety 07/26/2017   Vaginal dryness 04/10/2017   Vaginal atrophy 04/10/2017   Urinary incontinence 04/10/2017   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) 02/22/2011    REFERRING PROVIDER: Ronnie Doss DO  REFERRING DIAG: Chronic left-sided low back pain with left-sided sciatica.  Rationale for Evaluation and Treatment Rehabilitation  THERAPY DIAG:  Other low back pain  ONSET DATE: ~2 months.  SUBJECTIVE:  SUBJECTIVE STATEMENT: The dry needling was very helpful. PERTINENT HISTORY:  Right reverse total shoulder replacement, DM, HTN, OP.  PAIN:  Are you having pain? 3-4/10 left hip. Soreness and ache   OBJECTIVE:   TODAY'S TREATMENT                                   Trigger Point Dry-Needling  Treatment instructions: Expect mild to moderate muscle soreness. S/S of pneumothorax if dry needled over a lung field, and to seek immediate medical attention should they occur. Patient verbalized understanding of these instructions and education.  Patient Consent Given: Yes Education handout provided: Yes Muscles treated: Left TFL and glut med  Treatment response/outcome: Excellent response.  Date: 07/24/22: Nustep on level 3 x 16 minutes STW/M x 7 minutes to patient's left lateral hip musculature to decrease tone. HMP and IFC at 80-150 Hz on 40% scan x 15 minutes to patient's left lateral hip musculature    ASSESSMENT:  CLINICAL IMPRESSION: Patient was very pleased with dry needling last session with a reduction is pain.  She did very well again today and felt better after treatment with normal modality response following removal of modality. SHORT TERM GOALS: Target date: 07/30/2022  Independent with an HEP. Baseline: Goal status: MET  LONG TERM GOALS: Target date: 08/27/2022  Sit to stand with pain not > 2-3/10. Baseline: 06/14/22: 7-8/10; 07/02/22: 5/10  Goal status: IN PROGRESS  2.  Stand 20 minutes with pain not > 3/10. Baseline: 06/14/22: 8/10; 07/02/22: can stand approx. 10 mins before requiring to sit Goal status: IN PROGRESS  3.  Walk a community distance with pain not > 3/10. Baseline: 06/14/22: 8/10; 07/02/22: 6-7/10  Goal status: IN PROGRESS  4.  Eliminate left LE symptoms. Baseline:  Goal status: IN PROGRESS  PLAN: PT FREQUENCY: 2x/week  PT DURATION: 6 weeks  PLANNED INTERVENTIONS: Therapeutic exercises, Therapeutic activity, Neuromuscular re-education, Gait training, Patient/Family education, Self Care, Dry Needling, Electrical stimulation, Cryotherapy, Moist heat, Ultrasound, and Manual therapy.  PLAN FOR NEXT SESSION: Modalities and STW/M as needed\.  Core exercise progression.   Nyelah Emmerich, [place], PT 07/24/2022, 10:51 AM

## 2022-07-26 ENCOUNTER — Ambulatory Visit: Payer: Medicare Other | Admitting: Physical Therapy

## 2022-07-26 DIAGNOSIS — M5459 Other low back pain: Secondary | ICD-10-CM

## 2022-07-26 NOTE — Therapy (Signed)
OUTPATIENT PHYSICAL THERAPY THORACOLUMBAR TREATMENT NOTE   Patient Name: Diana Pratt MRN: [id number] DOB:April 10, 1946, 76 y.o., female Today's Date: 07/26/2022   PT End of Session - 07/26/22 1246     Visit Number 16    Number of Visits 18    Date for PT Re-Evaluation 08/02/22    Authorization Type FOTO AT LEAST EVERY 5TH VISIT.  PROGRESS NOTE AT 10TH VISIT.  KX MODIFIER AFTER 15 VISITS.    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 Kenn File   Depression    Diabetes mellitus    Diabetic neuropathy ([place])    GERD (gastroesophageal reflux disease)    occ heartburn   Humerus fracture    right   Hyperlipemia    Hypertension    Interstitial cystitis    Osteoporosis    Pneumonia    15 yrs ago   PONV (postoperative nausea and vomiting)    Shortness of breath dyspnea    Past Surgical History:  Procedure Laterality Date   ABDOMINAL HYSTERECTOMY  03/1984   partial   COLONOSCOPY N/A 07/19/2014   Procedure: COLONOSCOPY;  Surgeon: Danie Binder, MD;  [facility];  Service: Endoscopy;  Laterality: N/A;  9:30   REVERSE SHOULDER ARTHROPLASTY Right 06/14/2016   Procedure: REVERSE SHOULDER ARTHROPLASTY;  Surgeon: Justice Britain, MD;  Location: [place];  Service: Orthopedics;  Laterality: Right;   Skin cancer removed  08/07/2016   left ear   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   Anxiety 07/26/2017   Vaginal dryness 04/10/2017   Vaginal atrophy 04/10/2017   Urinary incontinence 04/10/2017   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   Abdominal pain, acute, bilateral lower quadrant 07/01/2014   Transaminitis 07/01/2014   COPD (chronic obstructive pulmonary disease) ([place])    Asthma    Interstitial cystitis    Unspecified vitamin D deficiency 05/29/2013   Lumbar radiculopathy 05/29/2013   Back strain 05/29/2013   Unspecified asthma(493.90) 02/27/2013   Depression with anxiety 02/22/2011   Diabetes mellitus ([place]) 02/22/2011   Hyperlipidemia associated with type 2 diabetes mellitus ([place]) 02/22/2011   Hypertension associated with diabetes ([place]) 02/22/2011    REFERRING PROVIDER: Ronnie Doss DO  REFERRING DIAG: Chronic left-sided low back pain with left-sided sciatica.  Rationale for Evaluation and Treatment Rehabilitation  THERAPY DIAG:  Other low back pain  ONSET DATE: ~2 months.  SUBJECTIVE:  SUBJECTIVE STATEMENT: The dry needling is helping a lot I'm now 60% better. PERTINENT HISTORY:  Right reverse total shoulder replacement, DM, HTN, OP.  PAIN:  Are you having pain? 3-4/10 left hip. Soreness and ache   OBJECTIVE:   TODAY'S TREATMENT                                   Trigger Point Dry-Needling  Treatment instructions: Expect mild to moderate muscle soreness. S/S of pneumothorax if dry needled over a lung field, and to seek immediate medical attention should they occur. Patient verbalized understanding of these instructions and education.  Patient Consent Given: Yes Education handout provided: Yes Muscles treated: Left TFL and glut med  Treatment response/outcome: Excellent response.  Date: 07/26/22: Nustep on level 3 x 16 minutes STW/M x 7 minutes to patient's left lateral hip musculature and left QL to decrease tone. HMP and IFC at 80-150 Hz on 40% scan x 15 minutes to patient's left  lateral hip musculature   ASSESSMENT:  CLINICAL IMPRESSION: Patient states dry needling is helping a lot.  She found to have increased tone over her left QL with a good response that included ischemic release technique to this muscle.  SHORT TERM GOALS: Target date: 07/30/2022  Independent with an HEP. Baseline: Goal status: MET  LONG TERM GOALS: Target date: 08/27/2022  Sit to stand with pain not > 2-3/10. Baseline: 06/14/22: 7-8/10; 07/02/22: 5/10  Goal status: IN PROGRESS  2.  Stand 20 minutes with pain not > 3/10. Baseline: 06/14/22: 8/10; 07/02/22: can stand approx. 10 mins before requiring to sit Goal status: IN PROGRESS  3.  Walk a community distance with pain not > 3/10. Baseline: 06/14/22: 8/10; 07/02/22: 6-7/10  Goal status: IN PROGRESS  4.  Eliminate left LE symptoms. Baseline:  Goal status: IN PROGRESS  PLAN: PT FREQUENCY: 2x/week  PT DURATION: 6 weeks  PLANNED INTERVENTIONS: Therapeutic exercises, Therapeutic activity, Neuromuscular re-education, Gait training, Patient/Family education, Self Care, Dry Needling, Electrical stimulation, Cryotherapy, Moist heat, Ultrasound, and Manual therapy.  PLAN FOR NEXT SESSION: Modalities and STW/M as needed\.  Core exercise progression.   Tonjia Parillo, [place], PT 07/26/2022, 12:47 PM

## 2022-07-30 ENCOUNTER — Encounter: Payer: Self-pay | Admitting: Physical Therapy

## 2022-07-30 ENCOUNTER — Ambulatory Visit: Payer: Medicare Other | Admitting: Physical Therapy

## 2022-07-30 DIAGNOSIS — M5459 Other low back pain: Secondary | ICD-10-CM

## 2022-07-30 NOTE — Therapy (Signed)
OUTPATIENT PHYSICAL THERAPY THORACOLUMBAR TREATMENT NOTE   Patient Name: Diana Pratt MRN: [id number] DOB:02-26-1946, 76 y.o., female Today's Date: 07/30/2022   PT End of Session - 07/30/22 0934     Visit Number 17    Number of Visits 18    Date for PT Re-Evaluation 08/02/22    Authorization Type FOTO AT LEAST EVERY 5TH VISIT.  PROGRESS NOTE AT 10TH VISIT.  KX MODIFIER AFTER 15 VISITS.    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 Kenn File   Depression    Diabetes mellitus    Diabetic neuropathy ([place])    GERD (gastroesophageal reflux disease)    occ heartburn   Humerus fracture    right   Hyperlipemia    Hypertension    Interstitial cystitis    Osteoporosis    Pneumonia    15 yrs ago   PONV (postoperative nausea and vomiting)    Shortness of breath dyspnea    Past Surgical History:  Procedure Laterality Date   ABDOMINAL HYSTERECTOMY  03/1984   partial   COLONOSCOPY N/A 07/19/2014   Procedure: COLONOSCOPY;  Surgeon: Danie Binder, MD;  [facility];  Service: Endoscopy;  Laterality: N/A;  9:30   REVERSE SHOULDER ARTHROPLASTY Right 06/14/2016   Procedure: REVERSE SHOULDER ARTHROPLASTY;  Surgeon: Justice Britain, MD;  Location: [place];  Service: Orthopedics;  Laterality: Right;   Skin cancer removed  08/07/2016   left ear   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   Anxiety 07/26/2017   Vaginal dryness 04/10/2017   Vaginal atrophy 04/10/2017   Urinary incontinence 04/10/2017   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   Abdominal pain, acute, bilateral lower quadrant 07/01/2014   Transaminitis 07/01/2014   COPD (chronic obstructive pulmonary disease) ([place])    Asthma    Interstitial cystitis    Unspecified vitamin D deficiency 05/29/2013   Lumbar radiculopathy 05/29/2013   Back strain 05/29/2013   Unspecified asthma(493.90) 02/27/2013   Depression with anxiety 02/22/2011   Diabetes mellitus ([place]) 02/22/2011   Hyperlipidemia associated with type 2 diabetes mellitus ([place]) 02/22/2011   Hypertension associated with diabetes ([place]) 02/22/2011    REFERRING PROVIDER: Ronnie Doss DO  REFERRING DIAG: Chronic left-sided low back pain with left-sided sciatica.  Rationale for Evaluation and Treatment Rehabilitation  THERAPY DIAG:  Other low back pain  ONSET DATE: ~2 months.  SUBJECTIVE:  SUBJECTIVE STATEMENT: Patient very pleased with progress. PERTINENT HISTORY:  Right reverse total shoulder replacement, DM, HTN, OP.  PAIN:  Are you having pain? 3-4/10 left hip. Soreness and ache   OBJECTIVE:   TODAY'S TREATMENT                                   Trigger Point Dry-Needling  Treatment instructions: Expect mild to moderate muscle soreness. S/S of pneumothorax if dry needled over a lung field, and to seek immediate medical attention should they occur. Patient verbalized understanding of these instructions and education.  Patient Consent Given: Yes Education handout provided: Yes Muscles treated: Left lower lumbar multifidi and Iliocostalis  Treatment response/outcome: Excellent response.  Date: 07/26/22: Nustep on level 3 x 14 minutes STW/M x 9 minutes to patient's left lateral hip musculature and left QL to decrease tone. HMP and IFC at 80-150 Hz on 40% scan x 10 minutes to patient's  left lateral hip musculature   ASSESSMENT:  CLINICAL IMPRESSION: Excellent job with dry needling to lower lumbar musculature as described above.  Patient felt good after treatment with normal modality response following removal of modality.  SHORT TERM GOALS: Target date: 07/30/2022  Independent with an HEP. Baseline: Goal status: MET  LONG TERM GOALS: Target date: 08/27/2022  Sit to stand with pain not > 2-3/10. Baseline: 06/14/22: 7-8/10; 07/02/22: 5/10  Goal status: IN PROGRESS  2.  Stand 20 minutes with pain not > 3/10. Baseline: 06/14/22: 8/10; 07/02/22: can stand approx. 10 mins before requiring to sit Goal status: IN PROGRESS  3.  Walk a community distance with pain not > 3/10. Baseline: 06/14/22: 8/10; 07/02/22: 6-7/10  Goal status: IN PROGRESS  4.  Eliminate left LE symptoms. Baseline:  Goal status: IN PROGRESS  PLAN: PT FREQUENCY: 2x/week  PT DURATION: 6 weeks  PLANNED INTERVENTIONS: Therapeutic exercises, Therapeutic activity, Neuromuscular re-education, Gait training, Patient/Family education, Self Care, Dry Needling, Electrical stimulation, Cryotherapy, Moist heat, Ultrasound, and Manual therapy.  PLAN FOR NEXT SESSION: Modalities and STW/M as needed\.  Core exercise progression.   Tecia Cinnamon, [place], PT 07/30/2022, 10:06 AM

## 2022-07-31 ENCOUNTER — Telehealth: Payer: Medicare Other

## 2022-07-31 ENCOUNTER — Other Ambulatory Visit: Payer: Self-pay | Admitting: Family Medicine

## 2022-07-31 DIAGNOSIS — F418 Other specified anxiety disorders: Secondary | ICD-10-CM

## 2022-07-31 DIAGNOSIS — N952 Postmenopausal atrophic vaginitis: Secondary | ICD-10-CM

## 2022-08-01 ENCOUNTER — Ambulatory Visit: Payer: Medicare Other

## 2022-08-01 DIAGNOSIS — M5459 Other low back pain: Secondary | ICD-10-CM

## 2022-08-01 NOTE — Addendum Note (Signed)
Addended by: Eisa Conaway, [place] W on: 08/01/2022 11:47 AM   Modules accepted: Orders

## 2022-08-01 NOTE — Therapy (Addendum)
OUTPATIENT PHYSICAL THERAPY THORACOLUMBAR TREATMENT NOTE   Patient Name: Diana Pratt MRN: [id number] DOB:04/20/1946, 76 y.o., female Today's Date: 08/01/2022   PT End of Session - 08/01/22 0907     Visit Number 18    Number of Visits 21    Date for PT Re-Evaluation 08/23/22    Authorization Type FOTO AT LEAST EVERY 5TH VISIT.  PROGRESS NOTE AT 10TH VISIT.  KX MODIFIER AFTER 15 VISITS.    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 samuel bradshaw   Depression    Diabetes mellitus    Diabetic neuropathy ([facility])    GERD (gastroesophageal reflux disease)    occ heartburn   Humerus fracture    right   Hyperlipemia    Hypertension    Interstitial cystitis    Osteoporosis    Pneumonia    15 yrs ago   PONV (postoperative nausea and vomiting)    Shortness of breath dyspnea    Past Surgical History:  Procedure Laterality Date   ABDOMINAL HYSTERECTOMY  03/1984   partial   COLONOSCOPY N/A 07/19/2014   Procedure: COLONOSCOPY;  Surgeon: Sandi L Fields, MD;  [facility];  Service: Endoscopy;  Laterality: N/A;  9:30   REVERSE SHOULDER ARTHROPLASTY Right 06/14/2016   Procedure: REVERSE SHOULDER ARTHROPLASTY;  Surgeon: Kevin Supple, MD;  [facility];  Service: Orthopedics;  Laterality: Right;   Skin cancer removed  08/07/2016   left ear   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   Anxiety 07/26/2017   Vaginal dryness 04/10/2017   Vaginal atrophy 04/10/2017   Urinary incontinence 04/10/2017   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   Abdominal pain, acute, bilateral lower quadrant 07/01/2014   Transaminitis 07/01/2014   COPD (chronic obstructive pulmonary disease) ([facility])    Asthma    Interstitial cystitis    Unspecified vitamin D deficiency 05/29/2013   Lumbar radiculopathy 05/29/2013   Back strain 05/29/2013   Unspecified asthma(493.90) 02/27/2013   Depression with anxiety 02/22/2011   Diabetes mellitus ([facility]) 02/22/2011   Hyperlipidemia associated with type 2 diabetes mellitus ([facility]) 02/22/2011   Hypertension associated with diabetes ([facility]) 02/22/2011    REFERRING PROVIDER: Ashly Gottschalk DO  REFERRING DIAG: Chronic left-sided low back pain with left-sided sciatica.  Rationale for Evaluation and Treatment Rehabilitation  THERAPY DIAG:  Other low back pain - Plan: PT plan of care cert/re-cert  ONSET DATE: ~2 months.  SUBJECTIVE:                                                                                                                                                                                             SUBJECTIVE STATEMENT: Patient reports decreased pain with performing STS transfers.  PERTINENT HISTORY:  Right reverse total shoulder replacement, DM, HTN, OP.  PAIN:  Are you having pain? 4/10 left hip. Soreness and ache   OBJECTIVE:   TODAY'S TREATMENT                                     EXERCISE LOG  Exercise Repetitions and Resistance Comments  Nustep Lvl 4 x 15 mins                    Blank cell = exercise not performed today                                  Manual Therapy Soft Tissue Mobilization: left hip, STW/M to left hip musculature and left QL to decrease pain and tone with pt in right side-lying    Modalities  Date:  Unattended Estim: Hip, IFC 80-150 Hz, 15 mins, Pain and Tone Hot Pack: Hip, 15 mins, Pain and Tone             ASSESSMENT:  CLINICAL IMPRESSION: Pt arrives for today's treatment session reporting 4/10 left  posterior hip pain.  Pt reports relief when performing STS since beginning dry needling. STW/M performed left posterior hip musculature and left QL to decrease pain and tone with pt in right side-lying.  Normal responses to estim and MH noted upon removal.  Pt has made progress towards all of her goals at this time, meeting two of them.  Pt reported decreased pain at completion of today's treatment session.    SHORT TERM GOALS: Target date: 07/30/2022  Independent with an HEP. Baseline: Goal status: MET  LONG TERM GOALS: Target date: 08/27/2022  Sit to stand with pain not > 2-3/10. Baseline: 06/14/22: 7-8/10; 07/02/22: 5/10: 08/01/22: 3/10 Goal status: MET  2.  Stand 20 minutes with pain not > 3/10. Baseline: 06/14/22: 8/10; 07/02/22: can stand approx. 10 mins before requiring to sit; 08/01/22: 15 mins before requiring seated rest break Goal status: IN PROGRESS  3.  Walk a community distance with pain not > 3/10. Baseline: 06/14/22: 8/10; 07/02/22: 6-7/10; 08/01/22: 5/10  Goal status: IN PROGRESS  4.  Eliminate left LE symptoms. Baseline:  Goal status: IN PROGRESS  PLAN: PT FREQUENCY: 2x/week  PT DURATION: 6 weeks  PLANNED INTERVENTIONS: Therapeutic exercises, Therapeutic activity, Neuromuscular re-education, Gait training, Patient/Family education, Self Care, Dry Needling, Electrical stimulation, Cryotherapy, Moist heat, Ultrasound, and Manual therapy.  PLAN FOR NEXT SESSION: Modalities and STW/M as needed\.  Core exercise progression.   APPLEGATE, CHAD, PT 08/01/2022, 11:46 AM  

## 2022-08-06 ENCOUNTER — Ambulatory Visit: Payer: Medicare Other | Attending: Family Medicine | Admitting: Physical Therapy

## 2022-08-06 DIAGNOSIS — M5459 Other low back pain: Secondary | ICD-10-CM | POA: Insufficient documentation

## 2022-08-06 NOTE — Therapy (Signed)
OUTPATIENT PHYSICAL THERAPY THORACOLUMBAR TREATMENT NOTE   Patient Name: Diana Pratt MRN: [id number] DOB:[birth year], 76 y.o., female Today's Date: 08/06/2022   PT End of Session - 08/06/22 1016     Visit Number 19    Number of Visits 21    Date for PT Re-Evaluation 08/23/22    Authorization Type FOTO AT LEAST EVERY 5TH VISIT.  PROGRESS NOTE AT 10TH VISIT.  KX MODIFIER AFTER 15 VISITS.    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 Kenn File   Depression    Diabetes mellitus    Diabetic neuropathy ([place])    GERD (gastroesophageal reflux disease)    occ heartburn   Humerus fracture    right   Hyperlipemia    Hypertension    Interstitial cystitis    Osteoporosis    Pneumonia    15 yrs ago   PONV (postoperative nausea and vomiting)    Shortness of breath dyspnea    Past Surgical History:  Procedure Laterality Date   ABDOMINAL HYSTERECTOMY  03/1984   partial   COLONOSCOPY N/A 07/19/2014   Procedure: COLONOSCOPY;  Surgeon: Danie Binder, MD;  [facility];  Service: Endoscopy;  Laterality: N/A;  9:30   REVERSE SHOULDER ARTHROPLASTY Right 06/14/2016   Procedure: REVERSE SHOULDER ARTHROPLASTY;  Surgeon: Justice Britain, MD;  Location: [place];  Service: Orthopedics;  Laterality: Right;   Skin cancer removed  08/07/2016   left ear   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   Anxiety 07/26/2017   Vaginal dryness 04/10/2017   Vaginal atrophy 04/10/2017   Urinary incontinence 04/10/2017   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   Abdominal pain, acute, bilateral lower quadrant 07/01/2014   Transaminitis 07/01/2014   COPD (chronic obstructive pulmonary disease) ([place])    Asthma    Interstitial cystitis    Unspecified vitamin D deficiency 05/29/2013   Lumbar radiculopathy 05/29/2013   Back strain 05/29/2013   Unspecified asthma(493.90) 02/27/2013   Depression with anxiety 02/22/2011   Diabetes mellitus ([place]) 02/22/2011   Hyperlipidemia associated with type 2 diabetes mellitus ([place]) 02/22/2011   Hypertension associated with diabetes ([place]) 02/22/2011    REFERRING PROVIDER: Ronnie Doss DO  REFERRING DIAG: Chronic left-sided low back pain with left-sided sciatica.  Rationale for Evaluation and Treatment Rehabilitation  THERAPY DIAG:  Other low back pain  ONSET DATE: ~2 months.  SUBJECTIVE:  SUBJECTIVE STATEMENT: Much better since dry needling.  PERTINENT HISTORY:  Right reverse total shoulder replacement, DM, HTN, OP.  PAIN:  Are you having pain? 3/10 left hip. Soreness and ache   OBJECTIVE:   TODAY'S TREATMENT                                     EXERCISE LOG  Exercise Repetitions and Resistance Comments  Nustep Lvl 4 x 15 mins                    Blank cell = exercise not performed today                                  Manual Therapy Trigger Point Dry-Needling  Treatment instructions: Expect mild to moderate muscle soreness. S/S of pneumothorax if dry needled over a lung field, and to seek immediate medical attention should they occur. Patient verbalized understanding of these instructions and education.  Patient Consent Given: Yes Education handout provided: Yes Muscles treated: Left multifidi, left glut med Treatment response/outcome: Great response and felt better  following.  Soft Tissue Mobilization: left hip, STW/M to left hip musculature and left QL to decrease pain and tone with pt in right side-lying  x 8 minutes.  Modalities  Date:  Unattended Estim: Hip, IFC 80-150 Hz, 20 mins, Pain and Tone Hot Pack: Hip, 15 mins, Pain and Tone             ASSESSMENT:  CLINICAL IMPRESSION: Patient presented to the clinic walking much better without antalgia.  Great response to left low back and left hip musculature dry needling with patient good after session.  Normal modality response following removal of modality.  SHORT TERM GOALS: Target date: 07/30/2022  Independent with an HEP. Baseline: Goal status: MET  LONG TERM GOALS: Target date: 08/27/2022  Sit to stand with pain not > 2-3/10. Baseline: 06/14/22: 7-8/10; 07/02/22: 5/10: 08/01/22: 3/10 Goal status: MET  2.  Stand 20 minutes with pain not > 3/10. Baseline: 06/14/22: 8/10; 07/02/22: can stand approx. 10 mins before requiring to sit; 08/01/22: 15 mins before requiring seated rest break Goal status: IN PROGRESS  3.  Walk a community distance with pain not > 3/10. Baseline: 06/14/22: 8/10; 07/02/22: 6-7/10; 08/01/22: 5/10  Goal status: IN PROGRESS  4.  Eliminate left LE symptoms. Baseline:  Goal status: IN PROGRESS  PLAN: PT FREQUENCY: 2x/week  PT DURATION: 6 weeks  PLANNED INTERVENTIONS: Therapeutic exercises, Therapeutic activity, Neuromuscular re-education, Gait training, Patient/Family education, Self Care, Dry Needling, Electrical stimulation, Cryotherapy, Moist heat, Ultrasound, and Manual therapy.  PLAN FOR NEXT SESSION: Modalities and STW/M as needed\.  Core exercise progression.   Aaran Enberg, [place], PT 08/06/2022, 10:18 AM

## 2022-08-09 ENCOUNTER — Ambulatory Visit: Payer: Medicare Other

## 2022-08-09 DIAGNOSIS — M5459 Other low back pain: Secondary | ICD-10-CM | POA: Diagnosis not present

## 2022-08-09 NOTE — Therapy (Signed)
OUTPATIENT PHYSICAL THERAPY THORACOLUMBAR TREATMENT NOTE   Patient Name: Diana Pratt MRN: [id number] DOB:Jun 11, 1946, 76 y.o., female Today's Date: 08/09/2022   PT End of Session - 08/09/22 0901     Visit Number 20    Number of Visits 21    Date for PT Re-Evaluation 08/23/22    Authorization Type FOTO AT LEAST EVERY 5TH VISIT.  PROGRESS NOTE AT 10TH VISIT.  KX MODIFIER AFTER 15 VISITS.    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 Kenn File   Depression    Diabetes mellitus    Diabetic neuropathy ([place])    GERD (gastroesophageal reflux disease)    occ heartburn   Humerus fracture    right   Hyperlipemia    Hypertension    Interstitial cystitis    Osteoporosis    Pneumonia    15 yrs ago   PONV (postoperative nausea and vomiting)    Shortness of breath dyspnea    Past Surgical History:  Procedure Laterality Date   ABDOMINAL HYSTERECTOMY  03/1984   partial   COLONOSCOPY N/A 07/19/2014   Procedure: COLONOSCOPY;  Surgeon: Danie Binder, MD;  [facility];  Service: Endoscopy;  Laterality: N/A;  9:30   REVERSE SHOULDER ARTHROPLASTY Right 06/14/2016   Procedure: REVERSE SHOULDER ARTHROPLASTY;  Surgeon: Justice Britain, MD;  Location: [place];  Service: Orthopedics;  Laterality: Right;   Skin cancer removed  08/07/2016   left ear   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   Anxiety 07/26/2017   Vaginal dryness 04/10/2017   Vaginal atrophy 04/10/2017   Urinary incontinence 04/10/2017   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   Abdominal pain, acute, bilateral lower quadrant 07/01/2014   Transaminitis 07/01/2014   COPD (chronic obstructive pulmonary disease) ([place])    Asthma    Interstitial cystitis    Unspecified vitamin D deficiency 05/29/2013   Lumbar radiculopathy 05/29/2013   Back strain 05/29/2013   Unspecified asthma(493.90) 02/27/2013   Depression with anxiety 02/22/2011   Diabetes mellitus ([place]) 02/22/2011   Hyperlipidemia associated with type 2 diabetes mellitus ([place]) 02/22/2011   Hypertension associated with diabetes ([place]) 02/22/2011    REFERRING PROVIDER: Ronnie Doss DO  REFERRING DIAG: Chronic left-sided low back pain with left-sided sciatica.  Rationale for Evaluation and Treatment Rehabilitation  THERAPY DIAG:  Other low back pain  ONSET DATE: ~2 months.  SUBJECTIVE:  SUBJECTIVE STATEMENT: Pt arrives for today's treatment session feeling good, but reports continued left hip pain.  PERTINENT HISTORY:  Right reverse total shoulder replacement, DM, HTN, OP.  PAIN:  Are you having pain? 3/10 left hip. Soreness and ache   OBJECTIVE:   TODAY'S TREATMENT                              10/5  EXERCISE LOG  Exercise Repetitions and Resistance Comments  Nustep Lvl 4 x 15 mins   Heel Raises 25 reps   Standing Marches 20 reps bil   Hip Abductions 20 reps bil        Blank cell = exercise not performed today                                  Manual Therapy   Soft Tissue Mobilization: left hip, STW/M to left hip musculature and left QL to decrease pain and tone with pt in right side-lying    Modalities  Date:  Unattended Estim: Hip, IFC 80-150 Hz, 15 mins, Pain and Tone Hot Pack: Hip, 15 mins, Pain and Tone   ASSESSMENT:  CLINICAL IMPRESSION: Pt arrives for today's  treatment session reporting 3/10 left hip pain.  Pt reports feeling much better since beginning dry needling.  Pt instructed in standing exercises today with min cues required for proper technique and posture.  Pt with tendency to lean with each rep, requiring min tactile cues to correct.  STW/M performed to left hip and left QL to decrease pain and tone.  Normal responses to estim and MH noted upon removal.  Pt reported 1/10 left hip pain at completion of today's treatment session.   SHORT TERM GOALS: Target date: 07/30/2022  Independent with an HEP. Baseline: Goal status: MET  LONG TERM GOALS: Target date: 08/27/2022  Sit to stand with pain not > 2-3/10. Baseline: 06/14/22: 7-8/10; 07/02/22: 5/10: 08/01/22: 3/10 Goal status: MET  2.  Stand 20 minutes with pain not > 3/10. Baseline: 06/14/22: 8/10; 07/02/22: can stand approx. 10 mins before requiring to sit; 08/01/22: 15 mins before requiring seated rest break Goal status: IN PROGRESS  3.  Walk a community distance with pain not > 3/10. Baseline: 06/14/22: 8/10; 07/02/22: 6-7/10; 08/01/22: 5/10  Goal status: IN PROGRESS  4.  Eliminate left LE symptoms. Baseline:  Goal status: IN PROGRESS  PLAN: PT FREQUENCY: 2x/week  PT DURATION: 6 weeks  PLANNED INTERVENTIONS: Therapeutic exercises, Therapeutic activity, Neuromuscular re-education, Gait training, Patient/Family education, Self Care, Dry Needling, Electrical stimulation, Cryotherapy, Moist heat, Ultrasound, and Manual therapy.  PLAN FOR NEXT SESSION: Modalities and STW/M as needed\.  Core exercise progression.   Kathrynn Ducking, PTA 08/09/2022, 10:08 AM

## 2022-08-13 ENCOUNTER — Ambulatory Visit: Payer: Medicare Other | Admitting: Physical Therapy

## 2022-08-13 ENCOUNTER — Encounter: Payer: Self-pay | Admitting: Physical Therapy

## 2022-08-13 DIAGNOSIS — M5459 Other low back pain: Secondary | ICD-10-CM | POA: Diagnosis not present

## 2022-08-13 NOTE — Therapy (Signed)
OUTPATIENT PHYSICAL THERAPY THORACOLUMBAR TREATMENT NOTE   Patient Name: Diana Pratt MRN: [id number] DOB:1946-02-09, 76 y.o., female Today's Date: 08/13/2022   PT End of Session - 08/13/22 1150     Visit Number 21    Number of Visits 21    Date for PT Re-Evaluation 08/23/22    Authorization Type FOTO AT LEAST EVERY 5TH VISIT.  PROGRESS NOTE AT 10TH VISIT.  KX MODIFIER AFTER 15 VISITS.    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 Kenn File   Depression    Diabetes mellitus    Diabetic neuropathy ([place])    GERD (gastroesophageal reflux disease)    occ heartburn   Humerus fracture    right   Hyperlipemia    Hypertension    Interstitial cystitis    Osteoporosis    Pneumonia    15 yrs ago   PONV (postoperative nausea and vomiting)    Shortness of breath dyspnea    Past Surgical History:  Procedure Laterality Date   ABDOMINAL HYSTERECTOMY  03/1984   partial   COLONOSCOPY N/A 07/19/2014   Procedure: COLONOSCOPY;  Surgeon: Danie Binder, MD;  [facility];  Service: Endoscopy;  Laterality: N/A;  9:30   REVERSE SHOULDER ARTHROPLASTY Right 06/14/2016   Procedure: REVERSE SHOULDER ARTHROPLASTY;  Surgeon: Justice Britain, MD;  Location: [place];  Service: Orthopedics;  Laterality: Right;   Skin cancer removed  08/07/2016   left ear   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   Anxiety 07/26/2017   Vaginal dryness 04/10/2017   Vaginal atrophy 04/10/2017   Urinary incontinence 04/10/2017   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   Abdominal pain, acute, bilateral lower quadrant 07/01/2014   Transaminitis 07/01/2014   COPD (chronic obstructive pulmonary disease) ([place])    Asthma    Interstitial cystitis    Unspecified vitamin D deficiency 05/29/2013   Lumbar radiculopathy 05/29/2013   Back strain 05/29/2013   Unspecified asthma(493.90) 02/27/2013   Depression with anxiety 02/22/2011   Diabetes mellitus ([place]) 02/22/2011   Hyperlipidemia associated with type 2 diabetes mellitus ([place]) 02/22/2011   Hypertension associated with diabetes ([place]) 02/22/2011    REFERRING PROVIDER: Ronnie Doss DO  REFERRING DIAG: Chronic left-sided low back pain with left-sided sciatica.  Rationale for Evaluation and Treatment Rehabilitation  THERAPY DIAG:  Other low back pain  ONSET DATE: ~2 months.  SUBJECTIVE:  SUBJECTIVE STATEMENT: Pt arrives for today's treatment session feeling good, but reports continued left hip pain.  PERTINENT HISTORY:  Right reverse total shoulder replacement, DM, HTN, OP.  PAIN:  Are you having pain? 3/10 left hip. Soreness and ache   OBJECTIVE:   TODAY'S TREATMENT                              10/5  EXERCISE LOG  Exercise Repetitions and Resistance Comments  Nustep Lvl 4 x 15 mins                    Blank cell = exercise not performed today                                  Manual Therapy  DN to left lower lumbar multifidi and left glut med. Soft Tissue Mobilization: left hip, STW/M to left hip musculature and left QL to decrease pain and tone with pt in right side-lying x 8 minutes.   Modalities  Date:  Unattended Estim: Hip, IFC 80-150 Hz, 15 mins, Pain and Tone Hot Pack: Hip, 15 mins, Pain and Tone   ASSESSMENT:  CLINICAL IMPRESSION: Patient very pleased with  progress.  She felt good after treatment today.  See d/c summary.  SHORT TERM GOALS: Target date: 07/30/2022  Independent with an HEP. Baseline: Goal status: MET  LONG TERM GOALS: Target date: 08/27/2022  Sit to stand with pain not > 2-3/10. Baseline: 06/14/22: 7-8/10; 07/02/22: 5/10: 08/01/22: 3/10 Goal status: MET  2.  Stand 20 minutes with pain not > 3/10. Baseline:  Goal status: MET  3.  Walk a community distance with pain not > 3/10. Baseline: Goal status: Partially met. 4.  Eliminate left LE symptoms. Baseline:  Goal status: Met.  Pain localized to left buttock today. PLAN: PT FREQUENCY: 2x/week  PT DURATION: 6 weeks  PLANNED INTERVENTIONS: Therapeutic exercises, Therapeutic activity, Neuromuscular re-education, Gait training, Patient/Family education, Self Care, Dry Needling, Electrical stimulation, Cryotherapy, Moist heat, Ultrasound, and Manual therapy.  PLAN FOR NEXT SESSION: Modalities and STW/M as needed\.  Core exercise progression.  PHYSICAL THERAPY DISCHARGE SUMMARY  Visits from Start of Care: 21  Current functional level related to goals / functional outcomes: See above.   Remaining deficits: See goal section.  Patient very pleased with progress stating she is much better since starting PT.   Education / Equipment: HEP.   Patient agrees to discharge. Patient goals were partially met. Patient is being discharged due to being pleased with the current functional level.   Shemuel Harkleroad, [place], PT 08/13/2022, 11:51 AM

## 2022-08-30 ENCOUNTER — Telehealth: Payer: Self-pay | Admitting: Family Medicine

## 2022-08-30 ENCOUNTER — Other Ambulatory Visit: Payer: Self-pay | Admitting: Family Medicine

## 2022-08-30 DIAGNOSIS — E1169 Type 2 diabetes mellitus with other specified complication: Secondary | ICD-10-CM

## 2022-08-30 NOTE — Telephone Encounter (Signed)
Pt aware refill  sent, done in Rx refill pool

## 2022-08-30 NOTE — Telephone Encounter (Signed)
  Prescription Request  08/30/2022  Is this a "Controlled Substance" medicine? Semaglutide, 1 MG/DOSE, (OZEMPIC, 1 MG/DOSE,) 4 MG/3ML SOPN  Have you seen your PCP in the last 2 weeks? 10/01/2022  If YES, route message to pool  -  If NO, patient needs to be scheduled for appointment.  What is the name of the medication or equipment? Semaglutide, 1 MG/DOSE, (OZEMPIC, 1 MG/DOSE,) 4 MG/3ML SOPN  Have you contacted your pharmacy to request a refill? no   Which pharmacy would you like this sent to? [facility]   Patient notified that their request is being sent to the clinical staff for review and that they should receive a response within 2 business days.

## 2022-10-01 ENCOUNTER — Encounter: Payer: Self-pay | Admitting: Family Medicine

## 2022-10-01 ENCOUNTER — Ambulatory Visit (INDEPENDENT_AMBULATORY_CARE_PROVIDER_SITE_OTHER): Payer: Medicare Other | Admitting: Family Medicine

## 2022-10-01 VITALS — BP 163/68 | HR 72 | Temp 97.3°F | Ht 64.0 in | Wt 197.6 lb

## 2022-10-01 DIAGNOSIS — F418 Other specified anxiety disorders: Secondary | ICD-10-CM

## 2022-10-01 DIAGNOSIS — E785 Hyperlipidemia, unspecified: Secondary | ICD-10-CM | POA: Diagnosis not present

## 2022-10-01 DIAGNOSIS — Z79899 Other long term (current) drug therapy: Secondary | ICD-10-CM

## 2022-10-01 DIAGNOSIS — I152 Hypertension secondary to endocrine disorders: Secondary | ICD-10-CM

## 2022-10-01 DIAGNOSIS — M85852 Other specified disorders of bone density and structure, left thigh: Secondary | ICD-10-CM | POA: Diagnosis not present

## 2022-10-01 DIAGNOSIS — E1169 Type 2 diabetes mellitus with other specified complication: Secondary | ICD-10-CM

## 2022-10-01 DIAGNOSIS — T466X5A Adverse effect of antihyperlipidemic and antiarteriosclerotic drugs, initial encounter: Secondary | ICD-10-CM | POA: Diagnosis not present

## 2022-10-01 DIAGNOSIS — Z794 Long term (current) use of insulin: Secondary | ICD-10-CM | POA: Diagnosis not present

## 2022-10-01 DIAGNOSIS — K219 Gastro-esophageal reflux disease without esophagitis: Secondary | ICD-10-CM

## 2022-10-01 DIAGNOSIS — M609 Myositis, unspecified: Secondary | ICD-10-CM | POA: Diagnosis not present

## 2022-10-01 DIAGNOSIS — G8929 Other chronic pain: Secondary | ICD-10-CM

## 2022-10-01 DIAGNOSIS — E1159 Type 2 diabetes mellitus with other circulatory complications: Secondary | ICD-10-CM

## 2022-10-01 DIAGNOSIS — M5442 Lumbago with sciatica, left side: Secondary | ICD-10-CM | POA: Diagnosis not present

## 2022-10-01 LAB — BAYER DCA HB A1C WAIVED: HB A1C (BAYER DCA - WAIVED): 5.4 % (ref 4.8–5.6)

## 2022-10-01 MED ORDER — OMEPRAZOLE 20 MG PO CPDR: 20.0000 mg | DELAYED_RELEASE_CAPSULE | Freq: Every day | ORAL | 3 refills | Status: DC

## 2022-10-01 MED ORDER — AMLODIPINE BESYLATE 10 MG PO TABS: 10.0000 mg | ORAL_TABLET | Freq: Every day | ORAL | 3 refills | Status: DC

## 2022-10-01 MED ORDER — VALSARTAN 320 MG PO TABS: 320.0000 mg | ORAL_TABLET | Freq: Every day | ORAL | 3 refills | Status: DC

## 2022-10-01 MED ORDER — METOPROLOL SUCCINATE ER 100 MG PO TB24: 100.0000 mg | ORAL_TABLET | Freq: Every day | ORAL | 3 refills | Status: DC

## 2022-10-01 MED ORDER — LORAZEPAM 1 MG PO TABS: ORAL_TABLET | ORAL | 3 refills | Status: DC

## 2022-10-01 MED ORDER — LANTUS SOLOSTAR 100 UNIT/ML ~~LOC~~ SOPN: 32.0000 [IU] | PEN_INJECTOR | Freq: Two times a day (BID) | SUBCUTANEOUS | 2 refills | Status: DC

## 2022-10-01 MED ORDER — OZEMPIC (1 MG/DOSE) 4 MG/3ML ~~LOC~~ SOPN: PEN_INJECTOR | SUBCUTANEOUS | 3 refills | Status: DC

## 2022-10-01 MED ORDER — HYDROCHLOROTHIAZIDE 25 MG PO TABS: 25.0000 mg | ORAL_TABLET | Freq: Every day | ORAL | 3 refills | Status: DC

## 2022-10-01 MED ORDER — FLUOXETINE HCL 10 MG PO CAPS: 30.0000 mg | ORAL_CAPSULE | Freq: Every day | ORAL | 3 refills | Status: DC

## 2022-10-01 NOTE — Progress Notes (Signed)
Subjective: CC:DM PCP: Diana Norlander, DO [name] is a 76 y.o. female presenting to clinic today for:  1. Type 2 Diabetes with hypertension, hyperlipidemia:  Compliant with insulin, Ozempic, hydrochlorothiazide, Norvasc, Diovan.  Has history of statin-induced myositis and not currently treated with any statin therapies.  She has had a couple hypoglycemic episodes since her last visit.  She subsequently reduced her insulin to 38 units and has been running in the 80s since that time.  Last eye exam: UTD Last foot exam: needs Last A1c:  Lab Results  Component Value Date   HGBA1C 5.2 05/25/2022   Nephropathy screen indicated?: needs Last flu, zoster and/or pneumovax:  Immunization History  Administered Date(s) Administered   DTaP 01/04/2003   Pneumococcal Conjugate-13 09/21/2019   Pneumococcal Polysaccharide-23 06/30/2018    ROS: No chest pain, shortness of breath, visual disturbance or falls.  2.  Anxiety disorder Patient reports chronic and stable anxiety disorder.  Compliant with medications.  No excessive daytime sedation, falls or respiratory depression.  3.  Bilateral hip pain Patient reports that the dry needling of her hip really seem to help.  She would like referral back to [place] at the physical therapy office next-door so that she can get this done again.  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 Kenn File   Depression    Diabetes mellitus    Diabetic neuropathy ([place])    GERD (gastroesophageal reflux disease)    occ heartburn   Humerus fracture    right   Hyperlipemia    Hypertension    Interstitial cystitis    Osteoporosis    Pneumonia    15 yrs ago   PONV (postoperative nausea and vomiting)    Shortness of breath dyspnea     Current Outpatient Medications:    Accu-Chek Softclix Lancets lancets, Check blood sugar 3 times daily Dx E11.40, Disp: 300 each, Rfl: 3   albuterol (PROAIR HFA) 108 (90 Base) MCG/ACT inhaler, Inhale 2 puffs into the lungs every 6 (six) hours as needed for shortness of breath., Disp: 54 g, Rfl: 2   amLODipine (NORVASC) 10 MG tablet, Take 1 tablet (10 mg total) by mouth daily., Disp: 90 tablet, Rfl: 3   aspirin 81 MG EC tablet, Take 81 mg by mouth daily., Disp: , Rfl:    blood glucose meter kit and supplies, Dispense based on patient and insurance preference. Use up to four times daily as directed. (FOR ICD-10 E10.9, E11.9)., Disp: 1 each, Rfl: 0   Blood Glucose Monitoring Suppl (ACCU-CHEK GUIDE) w/Device KIT, Use to check blood sugar up to TID and as needed. DX E11.49, Disp: 1 kit, Rfl: 0   budesonide-formoterol (SYMBICORT) 160-4.5 MCG/ACT inhaler, Inhale  2 puffs into the lungs 2 (two) times daily., Disp: 3 each, Rfl: 4   calcium carbonate 200 MG capsule, Take 250 mg by mouth daily., Disp: , Rfl:    Cholecalciferol (VITAMIN D3) 2000 UNITS capsule, Take 2,000 Units by mouth daily. , Disp: 60 capsule, Rfl: 11   conjugated estrogens (PREMARIN) vaginal cream, APPLY ONE-HALF GRAM (ONE-FOURTH APPLICATORFUL) VAGINALLY THREE TIMES A  WEEK, Disp: 30 g, Rfl: 3   FLUoxetine (PROZAC) 10 MG capsule, Take 3 capsules (30 mg total) by mouth daily., Disp: 270 capsule, Rfl: 3   fluticasone (FLONASE) 50 MCG/ACT nasal spray, Place 2 sprays into the nose daily as needed for allergies.  Congestion , Disp: , Rfl:    glucose blood (ACCU-CHEK GUIDE) test strip, Use to check BG up to tid.  Dx: type 2 diabetes requiring insulin therapy E11.40, Disp: 300 each, Rfl: 12   hydrochlorothiazide (HYDRODIURIL) 25 MG tablet, Take 1 tablet (25 mg total) by mouth daily., Disp: 90 tablet, Rfl: 3   hydroxypropyl methylcellulose (ISOPTO TEARS) 2.5 % ophthalmic solution, Place 1 drop into both eyes daily as needed. for dry eyes, Disp: , Rfl:    insulin glargine (LANTUS SOLOSTAR) 100 UNIT/ML Solostar Pen, Inject 42 Units into the skin 2 (two) times daily., Disp: 90 mL, Rfl: 2   Insulin Pen Needle (PEN NEEDLES) 31G X 6 MM MISC, Use to administer insulin once qid. Dx E11.59 Use Leader brand, Disp: 100 each, Rfl: 3   LORazepam (ATIVAN) 1 MG tablet, Take 1/2-1 tablet daily if needed for anxiety.  May repeat dose once if needed during the day. PUT ON FILE, Disp: 45 tablet, Rfl: 3   metoprolol succinate (TOPROL-XL) 100 MG 24 hr tablet, Take 1 tablet (100 mg total) by mouth daily. Take with or immediately following a meal., Disp: 90 tablet, Rfl: 3   Multiple Vitamins-Minerals (CENTRUM SILVER PO), Take 1 tablet by mouth daily., Disp: , Rfl:    omeprazole (PRILOSEC) 20 MG capsule, Take 1 capsule (20 mg total) by mouth daily., Disp: 90 capsule, Rfl: 3   Semaglutide, 1 MG/DOSE, (OZEMPIC, 1 MG/DOSE,) 4 MG/3ML SOPN, INJECT 1 MG SUBCUTANEOUSLY ONCE A WEEK - (ADMINISTER BY SUBCUTANEOUS INJECTION INTO THE ABDOMEN, THIGH, OR UPPER ARM AT ANY TIME OF DAY ON THE SAME DAY EACH WEEK) DISCARD PEN 56 DAYS AFTER FIRST USE, Disp: 9 mL, Rfl: 0   tiZANidine (ZANAFLEX) 4 MG tablet, Take 0.5-1 tablets (2-4 mg total) by mouth every 8 (eight) hours as needed for muscle spasms., Disp: 30 tablet, Rfl: 0   valsartan (DIOVAN) 320 MG tablet, Take 1 tablet (320 mg total) by mouth daily., Disp: 90 tablet, Rfl: 3 Social History   Socioeconomic History   Marital status: Married    Spouse name: [name]   Number of children: 1   Years of  education: 13   Highest education level: Some college, no degree  Occupational History   Occupation: retired     Comment: CNA   Tobacco Use   Smoking status: Former    Packs/day: 0.50    Years: 34.00    Total pack years: 17.00    Types: Cigarettes    Quit date: 06/01/1994    Years since quitting: 28.3   Smokeless tobacco: Never  Vaping Use   Vaping Use: Never used  Substance and Sexual Activity   Alcohol use: No   Drug use: No   Sexual activity: Yes    Birth control/protection: Surgical    Comment: hyst  Other Topics Concern  Not on file  Social History Narrative   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 [place]   Social Determinants of Health   Financial Resource Strain: Low Risk  (07/10/2022)   Overall Financial Resource Strain (CARDIA)    Difficulty of Paying Living Expenses: Not hard at all  Food Insecurity: No Food Insecurity (07/10/2022)   Hunger Vital Sign    Worried About Running Out of Food in the Last Year: Never true    Ran Out of Food in the Last Year: Never true  Transportation Needs: No Transportation Needs (07/10/2022)   PRAPARE - Hydrologist (Medical): No    Lack of Transportation (Non-Medical): No  Physical Activity: Sufficiently Active (07/10/2022)   Exercise Vital Sign    Days of Exercise per Week: 3 days    Minutes of Exercise per Session: 60 min  Stress: No Stress Concern Present (07/10/2022)   [place]    Feeling of Stress : Not at all  Social Connections: Spray (07/10/2022)   Social Connection and Isolation Panel [NHANES]    Frequency of Communication with Friends and Family: More than three times a week    Frequency of Social Gatherings with Friends and Family: More than three times a week    Attends Religious Services: More than 4 times per year    Active Member of [employer] or Organizations: Yes     Attends Music therapist: More than 4 times per year    Marital Status: Married  Human resources officer Violence: Not At Risk (07/10/2022)   Humiliation, Afraid, Rape, and Kick questionnaire    Fear of Current or Ex-Partner: No    Emotionally Abused: No    Physically Abused: No    Sexually Abused: No   Family History  Problem Relation Age of Onset   Parkinsonism Mother    Dementia Mother    Colon cancer Father 48       MULTIPLE CO-MORBIDITIES   Cancer Father    Diabetes Paternal Aunt    Diabetes Paternal Uncle    Diabetes Paternal Grandmother    Congestive Heart Failure Paternal Grandfather    Congestive Heart Failure Maternal Grandmother    Stroke Maternal Grandfather    Colon polyps Neg Hx     Objective: Office vital signs reviewed. BP (!) 163/68   Pulse 72   Temp (!) 97.3 F (36.3 C)   Ht _0  (1.626 m)   Wt 197 lb 9.6 oz (89.6 kg)   SpO2 97%   BMI 33.92 kg/m   Physical Examination:  General: Awake, alert, well nourished, No acute distress HEENT: Sclera white.  Moist mucous membranes Cardio: regular rate and rhythm, S1S2 heard, no murmurs appreciated Pulm: clear to auscultation bilaterally, no wheezes, rhonchi or rales; normal work of breathing on room air Extremities: warm, well perfused, No edema, cyanosis or clubbing; +2 pulses bilaterally MSK: Ambulating independently with slightly stiff gait and normal station  Assessment/ Plan: 76 y.o. female   Type 2 diabetes mellitus with other specified complication, with long-term current use of insulin ([place]) - Plan: CMP14+EGFR, Bayer DCA Hb A1c Waived, Microalbumin / creatinine urine ratio, insulin glargine (LANTUS SOLOSTAR) 100 UNIT/ML Solostar Pen, Semaglutide, 1 MG/DOSE, (OZEMPIC, 1 MG/DOSE,) 4 MG/3ML SOPN  Hypertension associated with diabetes ([place]) - Plan: CMP14+EGFR, amLODipine (NORVASC) 10 MG tablet, metoprolol succinate (TOPROL-XL) 100 MG 24 hr tablet, valsartan (DIOVAN) 320 MG tablet  Hyperlipidemia  associated with type 2 diabetes mellitus ([place]) - Plan: CMP14+EGFR, Lipid Panel  Statin-induced myositis  Depression with anxiety - Plan: FLUoxetine (PROZAC) 10 MG capsule, LORazepam (ATIVAN) 1 MG tablet  Chronic use of benzodiazepine for therapeutic purpose  Osteopenia of neck of left femur - Plan: DG [facility] DEXA  Gastroesophageal reflux disease without esophagitis - Plan: omeprazole (PRILOSEC) 20 MG capsule  Chronic left-sided low back pain with left-sided sciatica - Plan: Ambulatory referral to Physical Therapy  A1c under excellent control.  Agree with reduction in Lantus given hypoglycemic episodes.  Okay to reduce further if she continues to have these.  Blood pressure not at goal.  She will come back in in 2 weeks for blood pressure recheck with nurse  Check fasting lipid, CMP.  History of statin induced myositis  Anxiety and depression are chronic and stable.  Renewal of medications have been placed  DEXA scan ordered given osteopenia of femur.  She will have this done at her earliest convenience  For her left-sided back pain, referral back to physical therapy for consideration of dry needling  No orders of the defined types were placed in this encounter.  No orders of the defined types were placed in this encounter.    Diana Norlander, DO [place] [phone]

## 2022-10-01 NOTE — Patient Instructions (Signed)
A1c 5.4.  Ok to stay on reduced dose of Lantus.  If it starts dropping again, reduce it down to 32 units.

## 2022-10-02 LAB — LIPID PANEL
Chol/HDL Ratio: 5.1 ratio — ABNORMAL HIGH (ref 0.0–4.4)
Cholesterol, Total: 216 mg/dL — ABNORMAL HIGH (ref 100–199)
HDL: 42 mg/dL (ref >39)
LDL Chol Calc (NIH): 148 mg/dL — ABNORMAL HIGH (ref 0–99)
Triglycerides: 141 mg/dL (ref 0–149)
VLDL Cholesterol Cal: 26 mg/dL (ref 5–40)

## 2022-10-02 LAB — MICROALBUMIN / CREATININE URINE RATIO
Creatinine, Urine: 47.9 mg/dL
Microalb/Creat Ratio: <6 mg/g creat (ref 0–29)
Microalbumin, Urine: <3 ug/mL

## 2022-10-02 LAB — CMP14+EGFR
ALT: 14 IU/L (ref 0–32)
AST: 19 IU/L (ref 0–40)
Albumin/Globulin Ratio: 1.9 (ref 1.2–2.2)
Albumin: 4.3 g/dL (ref 3.8–4.8)
Alkaline Phosphatase: 82 IU/L (ref 44–121)
BUN/Creatinine Ratio: 19 (ref 12–28)
BUN: 16 mg/dL (ref 8–27)
Bilirubin Total: 0.4 mg/dL (ref 0.0–1.2)
CO2: 21 mmol/L (ref 20–29)
Calcium: 9.1 mg/dL (ref 8.7–10.3)
Chloride: 106 mmol/L (ref 96–106)
Creatinine, Ser: 0.84 mg/dL (ref 0.57–1.00)
Globulin, Total: 2.3 g/dL (ref 1.5–4.5)
Glucose: 90 mg/dL (ref 70–99)
Potassium: 3.7 mmol/L (ref 3.5–5.2)
Sodium: 144 mmol/L (ref 134–144)
Total Protein: 6.6 g/dL (ref 6.0–8.5)
eGFR: 72 mL/min/{1.73_m2} (ref >59)

## 2022-10-08 ENCOUNTER — Ambulatory Visit: Payer: Medicare Other | Attending: Family Medicine | Admitting: Physical Therapy

## 2022-10-08 ENCOUNTER — Other Ambulatory Visit: Payer: Self-pay

## 2022-10-08 ENCOUNTER — Encounter: Payer: Self-pay | Admitting: Physical Therapy

## 2022-10-08 DIAGNOSIS — G8929 Other chronic pain: Secondary | ICD-10-CM | POA: Insufficient documentation

## 2022-10-08 DIAGNOSIS — M62838 Other muscle spasm: Secondary | ICD-10-CM | POA: Insufficient documentation

## 2022-10-08 DIAGNOSIS — M5442 Lumbago with sciatica, left side: Secondary | ICD-10-CM | POA: Insufficient documentation

## 2022-10-08 DIAGNOSIS — M5459 Other low back pain: Secondary | ICD-10-CM | POA: Diagnosis not present

## 2022-10-08 NOTE — Therapy (Signed)
OUTPATIENT PHYSICAL THERAPY THORACOLUMBAR EVALUATION   Patient Name: Diana Pratt MRN: [id number] DOB:Jul 31, 1946, 76 y.o., female Today's Date: 10/08/2022  END OF SESSION:  PT End of Session - 10/08/22 1004     Visit Number 1    Number of Visits 8    Date for PT Re-Evaluation 11/05/22    Authorization Type FOTO AT LEAST EVERY 5TH VISIT.  PROGRESS NOTE AT 10TH VISIT.  KX MODIFIER AFTER 15 VISITS.    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 Kenn File   Depression    Diabetes mellitus    Diabetic neuropathy ([place])    GERD (gastroesophageal reflux disease)    occ heartburn   Humerus fracture    right   Hyperlipemia    Hypertension    Interstitial cystitis    Osteoporosis    Pneumonia    15 yrs ago   PONV (postoperative nausea and vomiting)    Shortness of breath dyspnea    Past Surgical History:  Procedure Laterality Date   ABDOMINAL HYSTERECTOMY  03/1984   partial   COLONOSCOPY N/A 07/19/2014   Procedure: COLONOSCOPY;  Surgeon: Danie Binder, MD;  [facility];  Service: Endoscopy;  Laterality: N/A;  9:30   REVERSE SHOULDER ARTHROPLASTY Right 06/14/2016   Procedure: REVERSE SHOULDER ARTHROPLASTY;  Surgeon: Justice Britain, MD;  Location: [facility];  Service: Orthopedics;  Laterality: Right;   Skin cancer removed  08/07/2016   left ear   Patient Active Problem List   Diagnosis Date Noted   Osteopenia of neck of left femur 10/01/2022   Chronic use of benzodiazepine for therapeutic purpose 10/01/2022   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   Anxiety 07/26/2017    Vaginal dryness 04/10/2017   Vaginal atrophy 04/10/2017   Urinary incontinence 04/10/2017   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   Abdominal pain, acute, bilateral lower quadrant 07/01/2014   Transaminitis 07/01/2014   COPD (chronic obstructive pulmonary disease) ([place])    Asthma    Interstitial cystitis    Unspecified vitamin D deficiency 05/29/2013   Lumbar radiculopathy 05/29/2013   Back strain 05/29/2013   Unspecified asthma(493.90) 02/27/2013   Depression with anxiety 02/22/2011   Diabetes mellitus ([place]) 02/22/2011   Hyperlipidemia associated with type 2 diabetes mellitus ([place]) 02/22/2011   Hypertension associated with diabetes ([place]) 02/22/2011    PCP: Ronnie Doss  REFERRING PROVIDER: Ronnie Doss  REFERRING DIAG: Chronic left-sided low back pain with sciatica.  Rationale for Evaluation and Treatment: Rehabilitation  THERAPY DIAG:  Other low back pain - Plan: PT plan of care cert/re-cert  Other muscle spasm - Plan: PT plan of care cert/re-cert  ONSET DATE: ~05/4162  SUBJECTIVE:  SUBJECTIVE STATEMENT: The patient returns to PT with c/o left sided LB and buttock pain that will travel to about the level of the knee.  She states PT was helpful before.  Her pain is now more localized to the left buttock region.  The pain down the LE is described as a burning and aching.  Her pain is a 6/10 today which tends to be the highest her pain rises.  She reports prolonged standing and walking will increase her pain.  Heat and massage decrease her pain.    PERTINENT HISTORY:  TSA, HTN, OP  PAIN:  Are you having pain? Yes: NPRS scale: 6/10 Pain location: left buttock, posterior thigh. Pain description: Ache, sore and burning. Aggravating factors: As above. Relieving factors: As  above.  PRECAUTIONS: Other: OP.  WEIGHT BEARING RESTRICTIONS: No  FALLS:  Has patient fallen in last 6 months? No  LIVING ENVIRONMENT: Lives with: lives with their spouse Lives in: House/apartment Has following equipment at home: None  OCCUPATION: Retired.  PLOF: Independent with basic ADLs  PATIENT GOALS: Decrease pain and eliminate symptoms down left LE.  NEXT MD VISIT:   OBJECTIVE:   PATIENT SURVEYS:  FOTO Complete.  POSTURE: rounded shoulders, forward head, increased lumbar lordosis, and posterior pelvic tilt  PALPATION: Some mild tenderness over patient's left low back musculature though her CC is pain in the left buttock/Piriformis region.  LUMBAR ROM:   Functional spinal movement. LOWER EXTREMITY ROM:     WFL.  LOWER EXTREMITY MMT:   Patient's bilateral hip strength is 4 to 4+/5.  Normal knee and ankle strength. LUMBAR SPECIAL TESTS:  Some pain reproduction with a left SLR test.   GAIT: Antalgic gait observed today.  TODAY'S TREATMENT:                                                                                                                              DATE: HMP and IFC to patient's left buttock region at 80-150 Hz on 40% scan x 15 minutes. Patient tolerated treatment without complaint with normal modality response following removal of modality.     ASSESSMENT:  CLINICAL IMPRESSION: The patient presents to OPPT with c/o left-sided low back pain with radiation down posterior thigh to level of knee.  PT in the past was helpful, especially dry needling.  Her pain was more localized to her left buttock/Piriformis region.  Prolonged standing and walking increase her pain.  She hopes to eliminate her left LE symptoms and be able to do more with less.  Patient will benefit from skilled physical therapy intervention to address pain and deficits.  OBJECTIVE IMPAIRMENTS: Abnormal gait, decreased activity tolerance, increased muscle spasms, and pain.    ACTIVITY LIMITATIONS: standing and locomotion level  PARTICIPATION LIMITATIONS: cleaning and laundry  PERSONAL FACTORS: Time since onset of injury/illness/exacerbation are also affecting patient's functional outcome.   REHAB POTENTIAL: Good  CLINICAL DECISION MAKING: Stable/uncomplicated  EVALUATION COMPLEXITY: Low   GOALS:  LONG TERM GOALS: Target date: 4 weeks.  Ind with an HEP. Baseline:  Goal status: INITIAL  2.  Eliminate left LE pain/symptoms. Baseline:  Goal status: INITIAL  3.  Walk a community distance with pain not > 3/10. Baseline:  Goal status: INITIAL  4.  Stand 20 minutes with pain not > 3/10. Baseline:  Goal status: INITIAL PLAN:  PT FREQUENCY: 2x/week  PT DURATION: 4 weeks  PLANNED INTERVENTIONS: Therapeutic exercises, Therapeutic activity, Patient/Family education, Self Care, Dry Needling, Electrical stimulation, Cryotherapy, Moist heat, Ultrasound, and Manual therapy.  PLAN FOR NEXT SESSION: Dry needling, modalities and STW/M.  Core exercise progression.   Neala Miggins, [place], PT 10/08/2022, 12:15 PM

## 2022-10-11 ENCOUNTER — Encounter: Payer: Self-pay | Admitting: Physical Therapy

## 2022-10-11 ENCOUNTER — Ambulatory Visit: Payer: Medicare Other | Admitting: Physical Therapy

## 2022-10-11 DIAGNOSIS — M5459 Other low back pain: Secondary | ICD-10-CM

## 2022-10-11 DIAGNOSIS — M62838 Other muscle spasm: Secondary | ICD-10-CM

## 2022-10-11 DIAGNOSIS — M5442 Lumbago with sciatica, left side: Secondary | ICD-10-CM | POA: Diagnosis not present

## 2022-10-11 DIAGNOSIS — G8929 Other chronic pain: Secondary | ICD-10-CM | POA: Diagnosis not present

## 2022-10-11 NOTE — Therapy (Signed)
OUTPATIENT PHYSICAL THERAPY THORACOLUMBAR TREATMENT   Patient Name: Diana Pratt MRN: [id number] DOB:[birth year], 76 y.o., female Today's Date: 10/11/2022  END OF SESSION:  PT End of Session - 10/11/22 0907     Visit Number 2    Number of Visits 8    Date for PT Re-Evaluation 11/05/22    Authorization Type FOTO AT LEAST EVERY 5TH VISIT.  PROGRESS NOTE AT 10TH VISIT.  KX MODIFIER AFTER 15 VISITS.    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 Kenn File   Depression    Diabetes mellitus    Diabetic neuropathy ([place])    GERD (gastroesophageal reflux disease)    occ heartburn   Humerus fracture    right   Hyperlipemia    Hypertension    Interstitial cystitis    Osteoporosis    Pneumonia    15 yrs ago   PONV (postoperative nausea and vomiting)    Shortness of breath dyspnea    Past Surgical History:  Procedure Laterality Date   ABDOMINAL HYSTERECTOMY  03/1984   partial   COLONOSCOPY N/A 07/19/2014   Procedure: COLONOSCOPY;  Surgeon: Danie Binder, MD;  [facility];  Service: Endoscopy;  Laterality: N/A;  9:30   REVERSE SHOULDER ARTHROPLASTY Right 06/14/2016   Procedure: REVERSE SHOULDER ARTHROPLASTY;  Surgeon: Justice Britain, MD;  Location: [facility];  Service: Orthopedics;  Laterality: Right;   Skin cancer removed  08/07/2016   left ear   Patient Active Problem List   Diagnosis Date Noted   Osteopenia of neck of left femur 10/01/2022   Chronic use of benzodiazepine for therapeutic purpose 10/01/2022   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   Anxiety 07/26/2017    Vaginal dryness 04/10/2017   Vaginal atrophy 04/10/2017   Urinary incontinence 04/10/2017   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   Abdominal pain, acute, bilateral lower quadrant 07/01/2014   Transaminitis 07/01/2014   COPD (chronic obstructive pulmonary disease) ([place])    Asthma    Interstitial cystitis    Unspecified vitamin D deficiency 05/29/2013   Lumbar radiculopathy 05/29/2013   Back strain 05/29/2013   Unspecified asthma(493.90) 02/27/2013   Depression with anxiety 02/22/2011   Diabetes mellitus ([place]) 02/22/2011   Hyperlipidemia associated with type 2 diabetes mellitus ([place]) 02/22/2011   Hypertension associated with diabetes ([place]) 02/22/2011    PCP: Ronnie Doss  REFERRING PROVIDER: Ronnie Doss  REFERRING DIAG: Chronic left-sided low back pain with sciatica.  Rationale for Evaluation and Treatment: Rehabilitation  THERAPY DIAG:  Other low back pain  Other muscle spasm  ONSET DATE: ~03/2022  SUBJECTIVE:  SUBJECTIVE STATEMENT: Pain about a 6/10.  PERTINENT HISTORY:  TSA, HTN, OP  PAIN:  Are you having pain? Yes: NPRS scale: 6/10 Pain location: left buttock, posterior thigh. Pain description: Ache, sore and burning. Aggravating factors: As above. Relieving factors: As above.  PRECAUTIONS: Other: OP.    PLOF: Independent with basic ADLs  PATIENT GOALS: Decrease pain and eliminate symptoms down left LE.  NEXT MD VISIT:   OBJECTIVE:    TODAY'S TREATMENT:                                                                                                                              DATE: Nustep at level 3 x 17 minutes f/b STW/M x 7 minutes including ischemic release technique to patient's left Piriformis f/b HMP and IFC to patient's left buttock region at  80-150 Hz on 40% scan x 20 minutes. Patient tolerated treatment without complaint with normal modality response following removal of modality.     ASSESSMENT:  CLINICAL IMPRESSION: Patient very tender over left Piriformis.  She did well today and felt better after treatment.    OBJECTIVE IMPAIRMENTS: Abnormal gait, decreased activity tolerance, increased muscle spasms, and pain.   ACTIVITY LIMITATIONS: standing and locomotion level  PARTICIPATION LIMITATIONS: cleaning and laundry  PERSONAL FACTORS: Time since onset of injury/illness/exacerbation are also affecting patient's functional outcome.   REHAB POTENTIAL: Good  CLINICAL DECISION MAKING: Stable/uncomplicated  EVALUATION COMPLEXITY: Low   GOALS:  LONG TERM GOALS: Target date: 4 weeks.  Ind with an HEP. Baseline:  Goal status: INITIAL  2.  Eliminate left LE pain/symptoms. Baseline:  Goal status: INITIAL  3.  Walk a community distance with pain not > 3/10. Baseline:  Goal status: INITIAL  4.  Stand 20 minutes with pain not > 3/10. Baseline:  Goal status: INITIAL PLAN:  PT FREQUENCY: 2x/week  PT DURATION: 4 weeks  PLANNED INTERVENTIONS: Therapeutic exercises, Therapeutic activity, Patient/Family education, Self Care, Dry Needling, Electrical stimulation, Cryotherapy, Moist heat, Ultrasound, and Manual therapy.  PLAN FOR NEXT SESSION: Dry needling, modalities and STW/M.  Core exercise progression.   Aleayah [place], [place], PT 10/11/2022, 9:13 AM

## 2022-10-15 ENCOUNTER — Ambulatory Visit: Payer: Medicare Other | Admitting: Physical Therapy

## 2022-10-15 DIAGNOSIS — M5459 Other low back pain: Secondary | ICD-10-CM

## 2022-10-15 DIAGNOSIS — M5442 Lumbago with sciatica, left side: Secondary | ICD-10-CM | POA: Diagnosis not present

## 2022-10-15 DIAGNOSIS — G8929 Other chronic pain: Secondary | ICD-10-CM | POA: Diagnosis not present

## 2022-10-15 DIAGNOSIS — M62838 Other muscle spasm: Secondary | ICD-10-CM

## 2022-10-15 NOTE — Therapy (Signed)
OUTPATIENT PHYSICAL THERAPY THORACOLUMBAR TREATMENT   Patient Name: Diana Pratt MRN: [id number] DOB:Oct 04, 1946, 76 y.o., female Today's Date: 10/15/2022  END OF SESSION:  PT End of Session - 10/15/22 1035     Visit Number 3    Number of Visits 8    Date for PT Re-Evaluation 11/05/22    Authorization Type FOTO AT LEAST EVERY 5TH VISIT.  PROGRESS NOTE AT 10TH VISIT.  KX MODIFIER AFTER 15 VISITS.    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 Kenn File   Depression    Diabetes mellitus    Diabetic neuropathy ([place])    GERD (gastroesophageal reflux disease)    occ heartburn   Humerus fracture    right   Hyperlipemia    Hypertension    Interstitial cystitis    Osteoporosis    Pneumonia    15 yrs ago   PONV (postoperative nausea and vomiting)    Shortness of breath dyspnea    Past Surgical History:  Procedure Laterality Date   ABDOMINAL HYSTERECTOMY  03/1984   partial   COLONOSCOPY N/A 07/19/2014   Procedure: COLONOSCOPY;  Surgeon: Danie Binder, MD;  [facility];  Service: Endoscopy;  Laterality: N/A;  9:30   REVERSE SHOULDER ARTHROPLASTY Right 06/14/2016   Procedure: REVERSE SHOULDER ARTHROPLASTY;  Surgeon: Justice Britain, MD;  Location: [place];  Service: Orthopedics;  Laterality: Right;   Skin cancer removed  08/07/2016   left ear   Patient Active Problem List   Diagnosis Date Noted   Osteopenia of neck of left femur 10/01/2022   Chronic use of benzodiazepine for therapeutic purpose 10/01/2022   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   Anxiety 07/26/2017    Vaginal dryness 04/10/2017   Vaginal atrophy 04/10/2017   Urinary incontinence 04/10/2017   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   Abdominal pain, acute, bilateral lower quadrant 07/01/2014   Transaminitis 07/01/2014   COPD (chronic obstructive pulmonary disease) ([place])    Asthma    Interstitial cystitis    Unspecified vitamin D deficiency 05/29/2013   Lumbar radiculopathy 05/29/2013   Back strain 05/29/2013   Unspecified asthma(493.90) 02/27/2013   Depression with anxiety 02/22/2011   Diabetes mellitus ([place]) 02/22/2011   Hyperlipidemia associated with type 2 diabetes mellitus ([place]) 02/22/2011   Hypertension associated with diabetes ([place]) 02/22/2011    PCP: Ronnie Doss  REFERRING PROVIDER: Ronnie Doss  REFERRING DIAG: Chronic left-sided low back pain with sciatica.  Rationale for Evaluation and Treatment: Rehabilitation  THERAPY DIAG:  Other low back pain  Other muscle spasm  ONSET DATE: ~03/2022  SUBJECTIVE:  SUBJECTIVE STATEMENT: Pain about a 5/10.  Better.  PERTINENT HISTORY:  TSA, HTN, OP  PAIN:  Are you having pain? Yes: NPRS scale: 5/10 Pain location: left buttock, posterior thigh. Pain description: Ache, sore and burning. Aggravating factors: As above. Relieving factors: As above.  PRECAUTIONS: Other: OP.    PLOF: Independent with basic ADLs  PATIENT GOALS: Decrease pain and eliminate symptoms down left LE.  NEXT MD VISIT:   OBJECTIVE:    TODAY'S TREATMENT:                                                                                                                              DATE: Nustep at level 3 x 13 minutes f/b Trigger Point Dry-Needling  Treatment instructions: Expect mild to moderate muscle soreness. S/S of pneumothorax if dry  needled over a lung field, and to seek immediate medical attention should they occur. Patient verbalized understanding of these instructions and education.  Patient Consent Given: Yes Education handout provided: Yes Muscles treated: Left TFL/Glut med Treatment response/outcome: Twitch f/b  STW/M x 10 minutes including ischemic release technique to patient's left Piriformis f/b HMP and IFC to patient's left buttock region at 80-150 Hz on 40% scan x 20 minutes. Patient tolerated treatment without complaint with normal modality response following removal of modality.     ASSESSMENT:  CLINICAL IMPRESSION: Patient pain down a bit from last session.  She did very well with dry needling today to her left TFL/Glut med.    OBJECTIVE IMPAIRMENTS: Abnormal gait, decreased activity tolerance, increased muscle spasms, and pain.   ACTIVITY LIMITATIONS: standing and locomotion level  PARTICIPATION LIMITATIONS: cleaning and laundry  PERSONAL FACTORS: Time since onset of injury/illness/exacerbation are also affecting patient's functional outcome.   REHAB POTENTIAL: Good  CLINICAL DECISION MAKING: Stable/uncomplicated  EVALUATION COMPLEXITY: Low   GOALS:  LONG TERM GOALS: Target date: 4 weeks.  Ind with an HEP. Baseline:  Goal status: INITIAL  2.  Eliminate left LE pain/symptoms. Baseline:  Goal status: INITIAL  3.  Walk a community distance with pain not > 3/10. Baseline:  Goal status: INITIAL  4.  Stand 20 minutes with pain not > 3/10. Baseline:  Goal status: INITIAL PLAN:  PT FREQUENCY: 2x/week  PT DURATION: 4 weeks  PLANNED INTERVENTIONS: Therapeutic exercises, Therapeutic activity, Patient/Family education, Self Care, Dry Needling, Electrical stimulation, Cryotherapy, Moist heat, Ultrasound, and Manual therapy.  PLAN FOR NEXT SESSION: Dry needling, modalities and STW/M.  Core exercise progression.   Mael Delap, [place], PT 10/15/2022, 11:08 AM

## 2022-10-16 ENCOUNTER — Telehealth: Payer: Self-pay | Admitting: Family Medicine

## 2022-10-16 ENCOUNTER — Ambulatory Visit: Payer: Medicare Other

## 2022-10-16 NOTE — Telephone Encounter (Signed)
Pt had apt for bp check but cancelled. Pt called in BP READING for today 140/73. Pt aware Lajuana Ripple it out this week.

## 2022-10-18 ENCOUNTER — Ambulatory Visit: Payer: Medicare Other | Admitting: Physical Therapy

## 2022-10-18 DIAGNOSIS — M62838 Other muscle spasm: Secondary | ICD-10-CM

## 2022-10-18 DIAGNOSIS — M5459 Other low back pain: Secondary | ICD-10-CM

## 2022-10-18 DIAGNOSIS — G8929 Other chronic pain: Secondary | ICD-10-CM | POA: Diagnosis not present

## 2022-10-18 DIAGNOSIS — M5442 Lumbago with sciatica, left side: Secondary | ICD-10-CM | POA: Diagnosis not present

## 2022-10-18 NOTE — Therapy (Signed)
OUTPATIENT PHYSICAL THERAPY THORACOLUMBAR TREATMENT   Patient Name: Diana Pratt MRN: [id number] DOB:[birth year], 76 y.o., female Today's Date: 10/18/2022  END OF SESSION:  PT End of Session - 10/18/22 0948     Visit Number 4    Number of Visits 8    Authorization Type FOTO AT [place].  PROGRESS NOTE AT 10TH VISIT.  KX MODIFIER AFTER 15 VISITS.    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 Kenn File   Depression    Diabetes mellitus    Diabetic neuropathy ([place])    GERD (gastroesophageal reflux disease)    occ heartburn   Humerus fracture    right   Hyperlipemia    Hypertension    Interstitial cystitis    Osteoporosis    Pneumonia    15 yrs ago   PONV (postoperative nausea and vomiting)    Shortness of breath dyspnea    Past Surgical History:  Procedure Laterality Date   ABDOMINAL HYSTERECTOMY  03/1984   partial   COLONOSCOPY N/A 07/19/2014   Procedure: COLONOSCOPY;  Surgeon: Danie Binder, MD;  [facility];  Service: Endoscopy;  Laterality: N/A;  9:30   REVERSE SHOULDER ARTHROPLASTY Right 06/14/2016   Procedure: REVERSE SHOULDER ARTHROPLASTY;  Surgeon: Justice Britain, MD;  Location: [place];  Service: Orthopedics;  Laterality: Right;   Skin cancer removed  08/07/2016   left ear   Patient Active Problem List   Diagnosis Date Noted   Osteopenia of neck of left femur 10/01/2022   Chronic use of benzodiazepine for therapeutic purpose 10/01/2022   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   Anxiety 07/26/2017   Vaginal dryness 04/10/2017   Vaginal  atrophy 04/10/2017   Urinary incontinence 04/10/2017   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   Abdominal pain, acute, bilateral lower quadrant 07/01/2014   Transaminitis 07/01/2014   COPD (chronic obstructive pulmonary disease) ([place])    Asthma    Interstitial cystitis    Unspecified vitamin D deficiency 05/29/2013   Lumbar radiculopathy 05/29/2013   Back strain 05/29/2013   Unspecified asthma(493.90) 02/27/2013   Depression with anxiety 02/22/2011   Diabetes mellitus ([place]) 02/22/2011   Hyperlipidemia associated with type 2 diabetes mellitus ([place]) 02/22/2011   Hypertension associated with diabetes ([place]) 02/22/2011    PCP: Ronnie Doss  REFERRING PROVIDER: Ronnie Doss  REFERRING DIAG: Chronic left-sided low back pain with sciatica.  Rationale for Evaluation and Treatment: Rehabilitation  THERAPY DIAG:  Other low back pain  Other muscle spasm  ONSET DATE: ~03/2022  SUBJECTIVE:  SUBJECTIVE STATEMENT: Felt real good after last treatment.  PERTINENT HISTORY:  TSA, HTN, OP  PAIN:  Are you having pain? Yes: NPRS scale: 5/10 Pain location: left buttock, posterior thigh. Pain description: Stiff. Aggravating factors: As above. Relieving factors: As above.  PRECAUTIONS: Other: OP.    PLOF: Independent with basic ADLs  PATIENT GOALS: Decrease pain and eliminate symptoms down left LE.  NEXT MD VISIT:   OBJECTIVE:    TODAY'S TREATMENT:                                                                                                                              DATE: Nustep at level 3 x 10 minutes f/b Trigger Point Dry-Needling  Treatment instructions: Expect mild to moderate muscle soreness. S/S of pneumothorax if dry needled over a lung field, and to seek immediate  medical attention should they occur. Patient verbalized understanding of these instructions and education.  Patient Consent Given: Yes Education handout provided: Yes Muscles treated: Proximal Left Piriformis. Treatment response/outcome: Twitch f/b  STW/M x 13 minutes including ischemic release technique to patient's left Piriformis f/b HMP and IFC to patient's left buttock region at 80-150 Hz on 40% scan x 20 minutes. Patient tolerated treatment without complaint with normal modality response following removal of modality.     ASSESSMENT:  CLINICAL IMPRESSION: Patient stating she felt very good after last treatment.  Dry needling to left proximal Piriformis today with good result.  OBJECTIVE IMPAIRMENTS: Abnormal gait, decreased activity tolerance, increased muscle spasms, and pain.   ACTIVITY LIMITATIONS: standing and locomotion level  PARTICIPATION LIMITATIONS: cleaning and laundry  PERSONAL FACTORS: Time since onset of injury/illness/exacerbation are also affecting patient's functional outcome.   REHAB POTENTIAL: Good  CLINICAL DECISION MAKING: Stable/uncomplicated  EVALUATION COMPLEXITY: Low   GOALS:  LONG TERM GOALS: Target date: 4 weeks.  Ind with an HEP. Baseline:  Goal status: INITIAL  2.  Eliminate left LE pain/symptoms. Baseline:  Goal status: INITIAL  3.  Walk a community distance with pain not > 3/10. Baseline:  Goal status: INITIAL  4.  Stand 20 minutes with pain not > 3/10. Baseline:  Goal status: INITIAL PLAN:  PT FREQUENCY: 2x/week  PT DURATION: 4 weeks  PLANNED INTERVENTIONS: Therapeutic exercises, Therapeutic activity, Patient/Family education, Self Care, Dry Needling, Electrical stimulation, Cryotherapy, Moist heat, Ultrasound, and Manual therapy.  PLAN FOR NEXT SESSION: Dry needling, modalities and STW/M.  Core exercise progression.   Tajha Sammarco, [place], PT 10/18/2022, 10:24 AM

## 2022-10-23 ENCOUNTER — Ambulatory Visit: Payer: Medicare Other | Admitting: Physical Therapy

## 2022-10-23 DIAGNOSIS — M62838 Other muscle spasm: Secondary | ICD-10-CM | POA: Diagnosis not present

## 2022-10-23 DIAGNOSIS — M5442 Lumbago with sciatica, left side: Secondary | ICD-10-CM | POA: Diagnosis not present

## 2022-10-23 DIAGNOSIS — M5459 Other low back pain: Secondary | ICD-10-CM | POA: Diagnosis not present

## 2022-10-23 DIAGNOSIS — G8929 Other chronic pain: Secondary | ICD-10-CM | POA: Diagnosis not present

## 2022-10-23 NOTE — Therapy (Signed)
OUTPATIENT PHYSICAL THERAPY THORACOLUMBAR TREATMENT   Patient Name: Diana Pratt MRN: [id number] DOB:10/08/1946, 76 y.o., female Today's Date: 10/23/2022  END OF SESSION:  PT End of Session - 10/23/22 1146     Visit Number 5    Number of Visits 8    Authorization Type FOTO AT [place].  PROGRESS NOTE AT 10TH VISIT.  KX MODIFIER AFTER 15 VISITS.    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 Kenn File   Depression    Diabetes mellitus    Diabetic neuropathy ([facility])    GERD (gastroesophageal reflux disease)    occ heartburn   Humerus fracture    right   Hyperlipemia    Hypertension    Interstitial cystitis    Osteoporosis    Pneumonia    15 yrs ago   PONV (postoperative nausea and vomiting)    Shortness of breath dyspnea    Past Surgical History:  Procedure Laterality Date   ABDOMINAL HYSTERECTOMY  03/1984   partial   COLONOSCOPY N/A 07/19/2014   Procedure: COLONOSCOPY;  Surgeon: Danie Binder, MD;  [facility];  Service: Endoscopy;  Laterality: N/A;  9:30   REVERSE SHOULDER ARTHROPLASTY Right 06/14/2016   Procedure: REVERSE SHOULDER ARTHROPLASTY;  Surgeon: Justice Britain, MD;  Location: [place];  Service: Orthopedics;  Laterality: Right;   Skin cancer removed  08/07/2016   left ear   Patient Active Problem List   Diagnosis Date Noted   Osteopenia of neck of left femur 10/01/2022   Chronic use of benzodiazepine for therapeutic purpose 10/01/2022   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   Anxiety 07/26/2017   Vaginal dryness 04/10/2017   Vaginal atrophy 04/10/2017   Urinary incontinence 04/10/2017   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   Abdominal pain, acute, bilateral lower quadrant 07/01/2014    Transaminitis 07/01/2014   COPD (chronic obstructive pulmonary disease) ([place])    Asthma    Interstitial cystitis    Unspecified vitamin D deficiency 05/29/2013   Lumbar radiculopathy 05/29/2013   Back strain 05/29/2013   Unspecified asthma(493.90) 02/27/2013   Depression with anxiety 02/22/2011   Diabetes mellitus ([place]) 02/22/2011   Hyperlipidemia associated with type 2 diabetes mellitus ([place]) 02/22/2011   Hypertension associated with diabetes ([place]) 02/22/2011    PCP: Ronnie Doss  REFERRING PROVIDER: Ronnie Doss  REFERRING DIAG: Chronic left-sided low back pain with sciatica.  Rationale for Evaluation and Treatment: Rehabilitation  THERAPY DIAG:  Other low back pain  Other muscle spasm  ONSET DATE: ~03/2022  SUBJECTIVE:  SUBJECTIVE STATEMENT: Felt real good after last treatment.  Pain down to a 4.  PERTINENT HISTORY:  TSA, HTN, OP  PAIN:  Are you having pain? Yes: NPRS scale: 4/10 Pain location: left buttock, posterior thigh. Pain description: Stiff. Aggravating factors: As above. Relieving factors: As above.  PRECAUTIONS: Other: OP.    PLOF: Independent with basic ADLs  PATIENT GOALS: Decrease pain and eliminate symptoms down left LE.  NEXT MD VISIT:   OBJECTIVE:    TODAY'S TREATMENT:                                                                                                                              DATE: Nustep at level 3 x 10 minutes f/b Trigger Point Dry-Needling  Treatment instructions: Expect mild to moderate muscle soreness. S/S of pneumothorax if dry needled over a lung field, and to seek immediate medical attention should they occur. Patient verbalized understanding of these instructions and education.  Patient Consent Given: Yes Education handout  provided: Yes Muscles treated: Proximal Left Piriformis., left glut med and TFL. Treatment response/outcome: Twitch f/b  STW/M x 13 minutes including ischemic release technique to patient's left Piriformis f/b HMP and IFC to patient's left buttock region at 80-150 Hz on 40% scan x 20 minutes. Patient tolerated treatment without complaint with normal modality response following removal of modality.     ASSESSMENT:  CLINICAL IMPRESSION: Patient stating she felt very good after last treatment and presented to the clinic with a lower pain-level. Dry needling to left proximal Piriformis and glut med and TFL today with good result.  Patient tolerated treatment without complaint with normal modality response following removal of modality.    OBJECTIVE IMPAIRMENTS: Abnormal gait, decreased activity tolerance, increased muscle spasms, and pain.   ACTIVITY LIMITATIONS: standing and locomotion level  PARTICIPATION LIMITATIONS: cleaning and laundry  PERSONAL FACTORS: Time since onset of injury/illness/exacerbation are also affecting patient's functional outcome.   REHAB POTENTIAL: Good  CLINICAL DECISION MAKING: Stable/uncomplicated  EVALUATION COMPLEXITY: Low   GOALS:  LONG TERM GOALS: Target date: 4 weeks.  Ind with an HEP. Baseline:  Goal status: INITIAL  2.  Eliminate left LE pain/symptoms. Baseline:  Goal status: INITIAL  3.  Walk a community distance with pain not > 3/10. Baseline:  Goal status: INITIAL  4.  Stand 20 minutes with pain not > 3/10. Baseline:  Goal status: INITIAL PLAN:  PT FREQUENCY: 2x/week  PT DURATION: 4 weeks  PLANNED INTERVENTIONS: Therapeutic exercises, Therapeutic activity, Patient/Family education, Self Care, Dry Needling, Electrical stimulation, Cryotherapy, Moist heat, Ultrasound, and Manual therapy.  PLAN FOR NEXT SESSION: Dry needling, modalities and STW/M.  Core exercise progression.   Cheila Wickstrom, [place], PT 10/23/2022, 12:08 PM

## 2022-10-25 ENCOUNTER — Ambulatory Visit: Payer: Medicare Other | Admitting: Physical Therapy

## 2022-10-25 DIAGNOSIS — M5459 Other low back pain: Secondary | ICD-10-CM | POA: Diagnosis not present

## 2022-10-25 DIAGNOSIS — M62838 Other muscle spasm: Secondary | ICD-10-CM | POA: Diagnosis not present

## 2022-10-25 DIAGNOSIS — M5442 Lumbago with sciatica, left side: Secondary | ICD-10-CM | POA: Diagnosis not present

## 2022-10-25 DIAGNOSIS — G8929 Other chronic pain: Secondary | ICD-10-CM | POA: Diagnosis not present

## 2022-10-25 NOTE — Therapy (Signed)
OUTPATIENT PHYSICAL THERAPY THORACOLUMBAR TREATMENT   Patient Name: Diana Pratt MRN: [id number] DOB:1946/10/27, 76 y.o., female Today's Date: 10/25/2022  END OF SESSION:  PT End of Session - 10/25/22 1509     Visit Number 6    Date for PT Re-Evaluation 11/05/22    Authorization Type FOTO AT LEAST EVERY 5TH VISIT.  PROGRESS NOTE AT 10TH VISIT.  KX MODIFIER AFTER 15 VISITS.    PT Start Time 0145    PT Stop Time 0240    PT Time Calculation (min) 55 min    Activity Tolerance Patient tolerated treatment well    Behavior During Therapy 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 Kenn File   Depression    Diabetes mellitus    Diabetic neuropathy ([place])    GERD (gastroesophageal reflux disease)    occ heartburn   Humerus fracture    right   Hyperlipemia    Hypertension    Interstitial cystitis    Osteoporosis    Pneumonia    15 yrs ago   PONV (postoperative nausea and vomiting)    Shortness of breath dyspnea    Past Surgical History:  Procedure Laterality Date   ABDOMINAL HYSTERECTOMY  03/1984   partial   COLONOSCOPY N/A 07/19/2014   Procedure: COLONOSCOPY;  Surgeon: Danie Binder, MD;  [facility];  Service: Endoscopy;  Laterality: N/A;  9:30   REVERSE SHOULDER ARTHROPLASTY Right 06/14/2016   Procedure: REVERSE SHOULDER ARTHROPLASTY;  Surgeon: Justice Britain, MD;  Location: [place];  Service: Orthopedics;  Laterality: Right;   Skin cancer removed  08/07/2016   left ear   Patient Active Problem List   Diagnosis Date Noted   Osteopenia of neck of left femur 10/01/2022   Chronic use of benzodiazepine for therapeutic purpose 10/01/2022   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   Anxiety 07/26/2017   Vaginal dryness  04/10/2017   Vaginal atrophy 04/10/2017   Urinary incontinence 04/10/2017   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   Abdominal pain, acute, bilateral lower quadrant 07/01/2014   Transaminitis 07/01/2014   COPD (chronic obstructive pulmonary disease) ([place])    Asthma    Interstitial cystitis    Unspecified vitamin D deficiency 05/29/2013   Lumbar radiculopathy 05/29/2013   Back strain 05/29/2013   Unspecified asthma(493.90) 02/27/2013   Depression with anxiety 02/22/2011   Diabetes mellitus ([place]) 02/22/2011   Hyperlipidemia associated with type 2 diabetes mellitus ([place]) 02/22/2011   Hypertension associated with diabetes ([place]) 02/22/2011    PCP: Ronnie Doss  REFERRING PROVIDER: Ronnie Doss  REFERRING DIAG: Chronic left-sided low back pain with sciatica.  Rationale for Evaluation and Treatment: Rehabilitation  THERAPY DIAG:  Other low back pain  Other muscle spasm  ONSET DATE: ~03/2022  SUBJECTIVE:  SUBJECTIVE STATEMENT: Felt real good after last treatment.  Pain down to a 4.  PERTINENT HISTORY:  TSA, HTN, OP  PAIN:  Are you having pain? Yes: NPRS scale: 4/10 Pain location: left buttock, posterior thigh. Pain description: Stiff. Aggravating factors: As above. Relieving factors: As above.  PRECAUTIONS: Other: OP.    PLOF: Independent with basic ADLs  PATIENT GOALS: Decrease pain and eliminate symptoms down left LE.  NEXT MD VISIT:   OBJECTIVE:    TODAY'S TREATMENT:                                                                                                                              DATE: Nustep at level 3 x 15 minutes f/b Trigger Point Dry-Needling  Treatment instructions: Expect mild to moderate muscle soreness. S/S of pneumothorax if dry needled  over a lung field, and to seek immediate medical attention should they occur. Patient verbalized understanding of these instructions and education.  Patient Consent Given: Yes Education handout provided: Yes Muscles treated: Proximal Left Piriformis., left glut med and TFL. Treatment response/outcome: Twitch f/b  STW/M x 8 minutes including ischemic release technique to patient's left Piriformis f/b HMP and IFC to patient's left buttock region at 80-150 Hz on 40% scan x 20 minutes. Patient tolerated treatment without complaint with normal modality response following removal of modality.     ASSESSMENT:  CLINICAL IMPRESSION: Patient reported soreness today but pleased with response to dry needling.    OBJECTIVE IMPAIRMENTS: Abnormal gait, decreased activity tolerance, increased muscle spasms, and pain.   ACTIVITY LIMITATIONS: standing and locomotion level  PARTICIPATION LIMITATIONS: cleaning and laundry  PERSONAL FACTORS: Time since onset of injury/illness/exacerbation are also affecting patient's functional outcome.   REHAB POTENTIAL: Good  CLINICAL DECISION MAKING: Stable/uncomplicated  EVALUATION COMPLEXITY: Low   GOALS:  LONG TERM GOALS: Target date: 4 weeks.  Ind with an HEP. Baseline:  Goal status: INITIAL  2.  Eliminate left LE pain/symptoms. Baseline:  Goal status: INITIAL  3.  Walk a community distance with pain not > 3/10. Baseline:  Goal status: INITIAL  4.  Stand 20 minutes with pain not > 3/10. Baseline:  Goal status: INITIAL PLAN:  PT FREQUENCY: 2x/week  PT DURATION: 4 weeks  PLANNED INTERVENTIONS: Therapeutic exercises, Therapeutic activity, Patient/Family education, Self Care, Dry Needling, Electrical stimulation, Cryotherapy, Moist heat, Ultrasound, and Manual therapy.  PLAN FOR NEXT SESSION: Dry needling, modalities and STW/M.  Core exercise progression.   Stellah Donovan, [place], PT 10/25/2022, 3:12 PM

## 2022-10-30 ENCOUNTER — Ambulatory Visit: Payer: Medicare Other | Admitting: Physical Therapy

## 2022-11-01 ENCOUNTER — Ambulatory Visit (INDEPENDENT_AMBULATORY_CARE_PROVIDER_SITE_OTHER): Payer: Medicare Other | Admitting: Pharmacist

## 2022-11-01 ENCOUNTER — Telehealth: Payer: Self-pay | Admitting: Pharmacist

## 2022-11-01 ENCOUNTER — Ambulatory Visit: Payer: Medicare Other | Admitting: Physical Therapy

## 2022-11-01 VITALS — BP 135/80

## 2022-11-01 DIAGNOSIS — E1169 Type 2 diabetes mellitus with other specified complication: Secondary | ICD-10-CM | POA: Diagnosis not present

## 2022-11-01 DIAGNOSIS — G720 Drug-induced myopathy: Secondary | ICD-10-CM

## 2022-11-01 DIAGNOSIS — T466X5A Adverse effect of antihyperlipidemic and antiarteriosclerotic drugs, initial encounter: Secondary | ICD-10-CM

## 2022-11-01 DIAGNOSIS — M5442 Lumbago with sciatica, left side: Secondary | ICD-10-CM | POA: Diagnosis not present

## 2022-11-01 DIAGNOSIS — E785 Hyperlipidemia, unspecified: Secondary | ICD-10-CM

## 2022-11-01 DIAGNOSIS — M5459 Other low back pain: Secondary | ICD-10-CM

## 2022-11-01 DIAGNOSIS — M62838 Other muscle spasm: Secondary | ICD-10-CM | POA: Diagnosis not present

## 2022-11-01 DIAGNOSIS — G8929 Other chronic pain: Secondary | ICD-10-CM | POA: Diagnosis not present

## 2022-11-01 MED ORDER — REPATHA SURECLICK 140 MG/ML ~~LOC~~ SOAJ: 140.0000 mg | SUBCUTANEOUS | 5 refills | Status: DC

## 2022-11-01 NOTE — Progress Notes (Signed)
11/01/2022 Name: Diana Pratt MRN: [id number] DOB: 11/16/1945  HPI:  Diana Pratt is a 76 y.o. female patient referred to lipid clinic by PCP.  She has an extensive history or statin trials/myopathy.  Her LDL remains elevated despite multiple personal and medication efforts. 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 Kenn File   Depression    Diabetes mellitus    Diabetic neuropathy ([place])    GERD (gastroesophageal reflux disease)    occ heartburn   Humerus fracture    right   Hyperlipemia    Hypertension    Interstitial cystitis    Osteoporosis    Pneumonia    15 yrs ago   PONV (postoperative nausea and vomiting)    Shortness of breath dyspnea    Current Medications: Current Outpatient Medications on File Prior to Visit  Medication Sig Dispense Refill   Accu-Chek Softclix Lancets lancets Check blood sugar 3 times daily Dx E11.40 300 each 3   albuterol (PROAIR HFA) 108 (90 Base) MCG/ACT inhaler Inhale 2 puffs into the lungs every 6 (six) hours as needed for shortness of breath. 54 g 2   amLODipine (NORVASC) 10 MG tablet Take 1 tablet (10 mg total) by mouth daily. 90 tablet 3   aspirin 81 MG EC tablet Take 81 mg by mouth daily.     blood glucose meter kit and supplies Dispense based on patient and insurance preference. Use up to four times daily as directed. (FOR ICD-10 E10.9, E11.9). 1 each 0   Blood Glucose Monitoring Suppl (ACCU-CHEK GUIDE) w/Device KIT Use to check blood sugar up to TID and as needed. DX E11.49 1 kit 0   budesonide-formoterol (SYMBICORT) 160-4.5 MCG/ACT inhaler Inhale 2 puffs into the lungs 2 (two) times daily. 3 each 4   calcium carbonate 200 MG capsule Take 250 mg by mouth daily.     Cholecalciferol (VITAMIN D3) 2000 UNITS capsule Take 2,000 Units by mouth  daily.  60 capsule 11   conjugated estrogens (PREMARIN) vaginal cream APPLY ONE-HALF GRAM (ONE-FOURTH APPLICATORFUL) VAGINALLY THREE TIMES A  WEEK 30 g 3   FLUoxetine (PROZAC) 10 MG capsule Take 3 capsules (30 mg total) by mouth daily. 270 capsule 3   fluticasone (FLONASE) 50 MCG/ACT nasal spray Place 2 sprays into the nose daily as needed for allergies. Congestion      glucose blood (ACCU-CHEK GUIDE) test strip Use to check BG up to tid.  Dx: type 2 diabetes requiring insulin therapy E11.40 300 each 12   hydrochlorothiazide (HYDRODIURIL) 25 MG tablet Take 1 tablet (25 mg total) by mouth daily. 90 tablet 3   hydroxypropyl methylcellulose (ISOPTO TEARS) 2.5 % ophthalmic solution Place 1 drop into both eyes daily as needed. for dry eyes     insulin glargine (LANTUS SOLOSTAR) 100 UNIT/ML Solostar Pen Inject 32-38 Units into the skin 2 (two) times daily. 90 mL 2   Insulin Pen Needle (PEN NEEDLES) 31G X 6 MM MISC Use to administer insulin once qid. Dx E11.59 Use Leader brand 100 each 3   LORazepam (ATIVAN) 1 MG tablet Take 1/2-1 tablet daily if needed for anxiety.  May repeat dose once if needed during the day. PUT ON FILE 45 tablet 3   metoprolol  succinate (TOPROL-XL) 100 MG 24 hr tablet Take 1 tablet (100 mg total) by mouth daily. Take with or immediately following a meal. 90 tablet 3   Multiple Vitamins-Minerals (CENTRUM SILVER PO) Take 1 tablet by mouth daily.     omeprazole (PRILOSEC) 20 MG capsule Take 1 capsule (20 mg total) by mouth daily. 90 capsule 3   Semaglutide, 1 MG/DOSE, (OZEMPIC, 1 MG/DOSE,) 4 MG/3ML SOPN INJECT 1 MG SUBCUTANEOUSLY ONCE A WEEK - (ADMINISTER BY SUBCUTANEOUS INJECTION INTO THE ABDOMEN, THIGH, OR UPPER ARM AT ANY TIME OF DAY ON THE SAME DAY EACH WEEK) DISCARD PEN 56 DAYS AFTER FIRST USE 9 mL 3   tiZANidine (ZANAFLEX) 4 MG tablet Take 0.5-1 tablets (2-4 mg total) by mouth every 8 (eight) hours as needed for muscle spasms. 30 tablet 0   valsartan (DIOVAN) 320 MG tablet Take 1  tablet (320 mg total) by mouth daily. 90 tablet 3   No current facility-administered medications on file prior to visit.   Intolerances: rosuvastatin, atorvastatin, simvastatin, pitivastatin, ezetimibe   Risk Factors: T2DM, HLD, HTN  LDL goal: <70  Diet: rec FOLLOWING A HEART HEALTHY DIET/HEALTHY PLATE METHOD  Exercise: encouraged as able     Labs: Lipid Panel     Component Value Date/Time   CHOL 216 (H) 10/01/2022 0847   CHOL 243 (H) 05/29/2013 1037   TRIG 141 10/01/2022 0847   TRIG 171 (H) 12/25/2013 0855   TRIG 304 (H) 05/29/2013 1037   HDL 42 10/01/2022 0847   HDL 38 (L) 12/25/2013 0855   HDL 30 (L) 05/29/2013 1037   CHOLHDL 5.1 (H) 10/01/2022 0847   LDLCALC 148 (H) 10/01/2022 0847   LDLCALC 192 (H) 12/25/2013 0855   LDLCALC 152 (H) 05/29/2013 1037   LDLDIRECT 136 (H) 09/25/2021 1342   LABVLDL 26 10/01/2022 0847      Assessment/Plan:    1. Hyperlipidemia - Start Repatha Sent to [facility] order per patient request Also routed to RX PA pools to assist with PA (instructions and DX included) Education provided JK:[id number] familiar as she is on injection for T2DM G72 coded for statin myopathy    Regina Eck, PharmD, BCPS, BCACP Clinical Pharmacist, [facility]  [facility]  T [phone]

## 2022-11-01 NOTE — Therapy (Signed)
OUTPATIENT PHYSICAL THERAPY THORACOLUMBAR TREATMENT   Patient Name: Diana Pratt MRN: [id number] DOB:Jun 14, 1946, 76 y.o., female Today's Date: 11/01/2022  END OF SESSION:  PT End of Session - 11/01/22 1020     Visit Number 7    Number of Visits 8    Date for PT Re-Evaluation 11/05/22    Authorization Type FOTO AT LEAST EVERY 5TH VISIT.  PROGRESS NOTE AT 10TH VISIT.  KX MODIFIER AFTER 15 VISITS.    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 Kenn File   Depression    Diabetes mellitus    Diabetic neuropathy ([place])    GERD (gastroesophageal reflux disease)    occ heartburn   Humerus fracture    right   Hyperlipemia    Hypertension    Interstitial cystitis    Osteoporosis    Pneumonia    15 yrs ago   PONV (postoperative nausea and vomiting)    Shortness of breath dyspnea    Past Surgical History:  Procedure Laterality Date   ABDOMINAL HYSTERECTOMY  03/1984   partial   COLONOSCOPY N/A 07/19/2014   Procedure: COLONOSCOPY;  Surgeon: Danie Binder, MD;  [facility];  Service: Endoscopy;  Laterality: N/A;  9:30   REVERSE SHOULDER ARTHROPLASTY Right 06/14/2016   Procedure: REVERSE SHOULDER ARTHROPLASTY;  Surgeon: Justice Britain, MD;  Location: [facility];  Service: Orthopedics;  Laterality: Right;   Skin cancer removed  08/07/2016   left ear   Patient Active Problem List   Diagnosis Date Noted   Osteopenia of neck of left femur 10/01/2022   Chronic use of benzodiazepine for therapeutic purpose 10/01/2022   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   Anxiety 07/26/2017    Vaginal dryness 04/10/2017   Vaginal atrophy 04/10/2017   Urinary incontinence 04/10/2017   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   Abdominal pain, acute, bilateral lower quadrant 07/01/2014   Transaminitis 07/01/2014   COPD (chronic obstructive pulmonary disease) ([place])    Asthma    Interstitial cystitis    Unspecified vitamin D deficiency 05/29/2013   Lumbar radiculopathy 05/29/2013   Back strain 05/29/2013   Unspecified asthma(493.90) 02/27/2013   Depression with anxiety 02/22/2011   Diabetes mellitus ([place]) 02/22/2011   Hyperlipidemia associated with type 2 diabetes mellitus ([place]) 02/22/2011   Hypertension associated with diabetes ([place]) 02/22/2011    PCP: Ronnie Doss  REFERRING PROVIDER: Ronnie Doss  REFERRING DIAG: Chronic left-sided low back pain with sciatica.  Rationale for Evaluation and Treatment: Rehabilitation  THERAPY DIAG:  Other low back pain  Other muscle spasm  ONSET DATE: ~03/2022  SUBJECTIVE:  SUBJECTIVE STATEMENT: Felt real good after last treatment.  Pain down to a 3.  PERTINENT HISTORY:  TSA, HTN, OP  PAIN:  Are you having pain? Yes: NPRS scale: 3/10 Pain location: left buttock, posterior thigh. Pain description: Stiff. Aggravating factors: As above. Relieving factors: As above.  PRECAUTIONS: Other: OP.    PLOF: Independent with basic ADLs  PATIENT GOALS: Decrease pain and eliminate symptoms down left LE.  NEXT MD VISIT:   OBJECTIVE:    TODAY'S TREATMENT:                                                                                                                              DATE: Nustep at level 3 x 15 minutes f/b Trigger Point Dry-Needling  Treatment instructions: Expect mild to moderate muscle soreness. S/S of  pneumothorax if dry needled over a lung field, and to seek immediate medical attention should they occur. Patient verbalized understanding of these instructions and education.  Patient Consent Given: Yes Education handout provided: Yes Muscles treated: Proximal Left Piriformis., left glut med and TFL. Treatment response/outcome: Twitch f/b  STW/M x 10 minutes including ischemic release technique to patient's left Piriformis f/b HMP and IFC to patient's left buttock region at 80-150 Hz on 40% scan x 20 minutes. Patient tolerated treatment without complaint with normal modality response following removal of modality.     ASSESSMENT:  CLINICAL IMPRESSION: Patient with a lowered pain-level since last visit.  Excellent twitch response to left proximal Piriformis today.    OBJECTIVE IMPAIRMENTS: Abnormal gait, decreased activity tolerance, increased muscle spasms, and pain.   ACTIVITY LIMITATIONS: standing and locomotion level  PARTICIPATION LIMITATIONS: cleaning and laundry  PERSONAL FACTORS: Time since onset of injury/illness/exacerbation are also affecting patient's functional outcome.   REHAB POTENTIAL: Good  CLINICAL DECISION MAKING: Stable/uncomplicated  EVALUATION COMPLEXITY: Low   GOALS:  LONG TERM GOALS: Target date: 4 weeks.  Ind with an HEP. Baseline:  Goal status: INITIAL  2.  Eliminate left LE pain/symptoms. Baseline:  Goal status: INITIAL  3.  Walk a community distance with pain not > 3/10. Baseline:  Goal status: INITIAL  4.  Stand 20 minutes with pain not > 3/10. Baseline:  Goal status: INITIAL PLAN:  PT FREQUENCY: 2x/week  PT DURATION: 4 weeks  PLANNED INTERVENTIONS: Therapeutic exercises, Therapeutic activity, Patient/Family education, Self Care, Dry Needling, Electrical stimulation, Cryotherapy, Moist heat, Ultrasound, and Manual therapy.  PLAN FOR NEXT SESSION: Dry needling, modalities and STW/M.  Core exercise progression.   Grethel Zenk, [place],  PT 11/01/2022, 11:00 AM

## 2022-11-01 NOTE — Telephone Encounter (Signed)
Please ensure repatha is [facility] May need PA (haven't entered and not sure how [facility] works) Patient has tried and failed multiple statins (see below) DX: E78.2  Thank you!!

## 2022-11-02 ENCOUNTER — Encounter: Payer: Self-pay | Admitting: Pharmacist

## 2022-11-02 ENCOUNTER — Other Ambulatory Visit (HOSPITAL_COMMUNITY): Payer: Self-pay

## 2022-11-02 NOTE — Telephone Encounter (Signed)
Patient Advocate Encounter   Received notification from [facility] that prior authorization for Repatha '140mg'$ /ml is required.   PA submitted on 11/02/2022 Key [id number] Status is pending     Joneen Boers, [place] Patient Advocate Specialist [facility] Patient Advocate Team Direct Number: [phone] Fax: [phone]

## 2022-11-06 ENCOUNTER — Ambulatory Visit: Payer: Medicare Other | Admitting: Physical Therapy

## 2022-11-08 ENCOUNTER — Encounter

## 2022-11-15 ENCOUNTER — Other Ambulatory Visit (HOSPITAL_COMMUNITY): Payer: Self-pay

## 2022-11-15 NOTE — Telephone Encounter (Signed)
Pharmacy Patient Advocate Encounter  Received notification from [facility] that the request for prior authorization for Repatha has been denied due to not meeting the prior authorization requirement(s). Drug coverage provided by your plan is limited only to those drugs covered under your medical benefit. Please refer to your Evidence of Coverage (EOC) For further information. Your Medicare Advantage (MA) plan does not cover outpatient prescription drugs. However, you may have a separate commercial pharmacy benefit or Medicare Part D benefit managed by another carrier.  I believe the pt gets all their medications through the [facility] and they would handle the PA if needed. We'll call them and let you know if we need anything further from you. Thanks.

## 2022-11-16 NOTE — Telephone Encounter (Signed)
Thank you!!  Okay let me know, because patient meets criteria if I need to assist with PA.  Patient needs this med as soon as possible.

## 2022-11-19 DIAGNOSIS — U071 COVID-19: Secondary | ICD-10-CM | POA: Diagnosis not present

## 2022-11-19 DIAGNOSIS — R432 Parageusia: Secondary | ICD-10-CM | POA: Diagnosis not present

## 2022-11-19 DIAGNOSIS — G4452 New daily persistent headache (NDPH): Secondary | ICD-10-CM | POA: Diagnosis not present

## 2022-11-19 DIAGNOSIS — R350 Frequency of micturition: Secondary | ICD-10-CM | POA: Diagnosis not present

## 2022-11-19 DIAGNOSIS — J029 Acute pharyngitis, unspecified: Secondary | ICD-10-CM | POA: Diagnosis not present

## 2022-11-22 NOTE — Telephone Encounter (Signed)
I called [facility]. They don't do prior authorizations unless it goes to the retail pharmacy. She said they showed the Repatha was delivered 1/9 @ 5:11pm. I tried calling [facility] to make sure she got it, but there was no answer and her mail box is full.

## 2023-01-02 ENCOUNTER — Telehealth: Payer: Self-pay | Admitting: Family Medicine

## 2023-01-02 NOTE — Telephone Encounter (Signed)
  Prescription Request  01/02/2023  Is this a "Controlled Substance" medicine? Yes   Have you seen your PCP in the last 2 weeks? no  If YES, route message to pool  -  If NO, patient needs to be scheduled for appointment.  What is the name of the medication or equipment? LORazepam (ATIVAN) 1 MG tablet   Have you contacted your pharmacy to request a refill? Yes, she said that they told her it was delivered on Saturday 2/24 but she did not receive it and thinks that someone else took it or it was delivered to the wrong address.   Which pharmacy would you like this sent to? [facility], because she is completely out and does not want to have to wait for another one to do delivered in the mail.    *Aware that the provider will have to be consulted about this   Patient notified that their request is being sent to the clinical staff for review and that they should receive a response within 2 business days.

## 2023-01-02 NOTE — Telephone Encounter (Signed)
Closing encounter pt found her medication

## 2023-01-17 ENCOUNTER — Other Ambulatory Visit: Payer: Self-pay | Admitting: Family Medicine

## 2023-01-17 DIAGNOSIS — E1169 Type 2 diabetes mellitus with other specified complication: Secondary | ICD-10-CM

## 2023-01-30 ENCOUNTER — Ambulatory Visit: Payer: Medicare Other

## 2023-01-30 ENCOUNTER — Encounter: Payer: Self-pay | Admitting: Family Medicine

## 2023-01-30 ENCOUNTER — Ambulatory Visit (INDEPENDENT_AMBULATORY_CARE_PROVIDER_SITE_OTHER): Payer: Medicare Other | Admitting: Family Medicine

## 2023-01-30 VITALS — BP 118/61 | HR 50 | Temp 98.6°F | Ht 64.0 in | Wt 195.0 lb

## 2023-01-30 DIAGNOSIS — T466X5A Adverse effect of antihyperlipidemic and antiarteriosclerotic drugs, initial encounter: Secondary | ICD-10-CM | POA: Diagnosis not present

## 2023-01-30 DIAGNOSIS — E1159 Type 2 diabetes mellitus with other circulatory complications: Secondary | ICD-10-CM

## 2023-01-30 DIAGNOSIS — Z794 Long term (current) use of insulin: Secondary | ICD-10-CM

## 2023-01-30 DIAGNOSIS — F418 Other specified anxiety disorders: Secondary | ICD-10-CM

## 2023-01-30 DIAGNOSIS — E1169 Type 2 diabetes mellitus with other specified complication: Secondary | ICD-10-CM

## 2023-01-30 DIAGNOSIS — G8929 Other chronic pain: Secondary | ICD-10-CM | POA: Diagnosis not present

## 2023-01-30 DIAGNOSIS — E785 Hyperlipidemia, unspecified: Secondary | ICD-10-CM | POA: Diagnosis not present

## 2023-01-30 DIAGNOSIS — Z79899 Other long term (current) drug therapy: Secondary | ICD-10-CM | POA: Diagnosis not present

## 2023-01-30 DIAGNOSIS — M5442 Lumbago with sciatica, left side: Secondary | ICD-10-CM

## 2023-01-30 DIAGNOSIS — G720 Drug-induced myopathy: Secondary | ICD-10-CM | POA: Diagnosis not present

## 2023-01-30 DIAGNOSIS — I152 Hypertension secondary to endocrine disorders: Secondary | ICD-10-CM

## 2023-01-30 LAB — BAYER DCA HB A1C WAIVED: HB A1C (BAYER DCA - WAIVED): 5.2 % (ref 4.8–5.6)

## 2023-01-30 MED ORDER — METHYLPREDNISOLONE ACETATE 40 MG/ML IJ SUSP: 40.0000 mg | Freq: Once | INTRAMUSCULAR | Status: DC

## 2023-01-30 MED ORDER — FREESTYLE LIBRE 3 SENSOR MISC: 3 refills | Status: DC

## 2023-01-30 MED ORDER — LORAZEPAM 1 MG PO TABS: ORAL_TABLET | ORAL | 3 refills | Status: DC

## 2023-01-30 MED ORDER — FREESTYLE LIBRE READER DEVI: 1.0000 [IU] | Freq: Every day | 1 refills | Status: DC

## 2023-01-30 MED ORDER — LANTUS SOLOSTAR 100 UNIT/ML ~~LOC~~ SOPN: 28.0000 [IU] | PEN_INJECTOR | Freq: Two times a day (BID) | SUBCUTANEOUS | 3 refills | Status: DC

## 2023-01-30 MED ORDER — PEN NEEDLES 31G X 6 MM MISC: 3 refills | Status: DC

## 2023-01-30 NOTE — Progress Notes (Addendum)
Subjective: CC:DM PCP: Janora Norlander, DO IL:[id number] Diana Pratt is a 77 y.o. female presenting to clinic today for:  1. Type 2 Diabetes with hypertension, hyperlipidemia:  Patient reports compliance with her medications.  Needs refills on pen needles, Lantus.  Denies any chest pain, shortness of breath, dizziness.  On average blood sugars have been running in the 80s.  She has had a couple of episodes of hypoglycemia into the 60s.  Currently injecting 30 units of Lantus in the morning and 28 units in the evening.  Compliant with Ozempic and denies any GI abnormalities.  Last eye exam: needs Last foot exam: needs Last A1c:  Lab Results  Component Value Date   HGBA1C 5.4 10/01/2022   Nephropathy screen indicated?: UTD Last flu, zoster and/or pneumovax:  Immunization History  Administered Date(s) Administered   DTaP 01/04/2003   Pneumococcal Conjugate-13 09/21/2019   Pneumococcal Polysaccharide-23 06/30/2018    2.  Left low back pain with sciatica Patient reports that her left low back pain with sciatica has been acting up again.  She was enrolled in physical therapy prior to getting COVID and has not resumed it.  She asks for a shot today in efforts to help with the pain.  3.  Depression with anxiety, chronic benzodiazepine use Patient is compliant with Prozac.  She continues to use Ativan fairly frequently and just had a refill.  However that was the last refill.  Denies any excessive daytime sedation, memory issues, falls, visual or auditory hallucinations.  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 Kenn File   Depression    Diabetes mellitus    Diabetic neuropathy (HCC)    GERD (gastroesophageal reflux disease)    occ heartburn   Humerus fracture    right   Hyperlipemia    Hypertension    Interstitial cystitis    Osteoporosis    Pneumonia    15 yrs ago   PONV (postoperative nausea and vomiting)    Shortness of breath dyspnea     Current Outpatient Medications:    Accu-Chek Softclix Lancets lancets, Check blood sugar 3 times daily Dx E11.40, Disp: 300 each, Rfl: 3   albuterol (PROAIR HFA) 108 (90 Base) MCG/ACT inhaler, Inhale 2 puffs into the lungs every 6 (six) hours as needed for shortness of breath., Disp: 54 g, Rfl: 2   amLODipine (NORVASC) 10 MG tablet, Take 1 tablet (10 mg total) by mouth daily., Disp: 90 tablet, Rfl: 3   aspirin 81 MG EC tablet, Take 81 mg by mouth daily., Disp: , Rfl:    blood glucose meter kit and supplies, Dispense based on patient and insurance preference. Use up to four times daily as directed. (FOR ICD-10 E10.9, E11.9)., Disp: 1 each,  Rfl: 0   Blood Glucose Monitoring Suppl (ACCU-CHEK GUIDE) w/Device KIT, Use to check blood sugar up to TID and as needed. DX E11.49, Disp: 1 kit, Rfl: 0   budesonide-formoterol (SYMBICORT) 160-4.5 MCG/ACT inhaler, Inhale 2 puffs into the lungs 2 (two) times daily., Disp: 3 each, Rfl: 4   calcium carbonate 200 MG capsule, Take 250 mg by mouth daily., Disp: , Rfl:    Cholecalciferol (VITAMIN D3) 2000 UNITS capsule, Take 2,000 Units by mouth daily. , Disp: 60 capsule, Rfl: 11   conjugated estrogens (PREMARIN) vaginal cream, APPLY ONE-HALF GRAM (ONE-FOURTH APPLICATORFUL) VAGINALLY THREE TIMES A  WEEK, Disp: 30 g, Rfl: 3   Evolocumab (REPATHA SURECLICK) [id number] MG/ML SOAJ, Inject 140 mg  into the skin every 14 (fourteen) days., Disp: 2 mL, Rfl: 5   FLUoxetine (PROZAC) 10 MG capsule, Take 3 capsules (30 mg total) by mouth daily., Disp: 270 capsule, Rfl: 3   fluticasone (FLONASE) 50 MCG/ACT nasal spray, Place 2 sprays into the nose daily as needed for allergies. Congestion , Disp: , Rfl:    glucose blood (ACCU-CHEK GUIDE) test strip, Use to check BG up to tid.  Dx: type 2 diabetes requiring insulin therapy E11.40, Disp: 300 each, Rfl: 12   hydrochlorothiazide (HYDRODIURIL) 25 MG tablet, Take 1 tablet (25 mg total) by mouth daily., Disp: 90 tablet, Rfl: 3   hydroxypropyl methylcellulose (ISOPTO TEARS) 2.5 % ophthalmic solution, Place 1 drop into both eyes daily as needed. for dry eyes, Disp: , Rfl:    insulin glargine (LANTUS SOLOSTAR) 100 UNIT/ML Solostar Pen, INJECT 45 UNITS SUBCUTANEOUSLY TWICE A DAY (DISCARD 28 DAYS AFTER FIRST USE), Disp: 15 mL, Rfl: 0   Insulin Pen Needle (PEN NEEDLES) 31G X 6 MM MISC, Use to administer insulin once qid. Dx E11.59 Use Leader brand, Disp: 100 each, Rfl: 3   LORazepam (ATIVAN) 1 MG tablet, Take 1/2-1 tablet daily if needed for anxiety.  May repeat dose once if needed during the day. PUT ON FILE, Disp: 45 tablet, Rfl: 3   metoprolol succinate (TOPROL-XL) 100 MG 24 hr tablet, Take 1 tablet (100 mg total) by mouth daily. Take with or immediately following a meal., Disp: 90 tablet, Rfl: 3   Multiple Vitamins-Minerals (CENTRUM SILVER PO), Take 1 tablet by mouth daily., Disp: , Rfl:    omeprazole (PRILOSEC) 20 MG capsule, Take 1 capsule (20 mg total) by mouth daily., Disp: 90 capsule, Rfl: 3   Semaglutide, 1 MG/DOSE, (OZEMPIC, 1 MG/DOSE,) 4 MG/3ML SOPN, INJECT 1 MG SUBCUTANEOUSLY ONCE A WEEK - (ADMINISTER BY SUBCUTANEOUS INJECTION INTO THE ABDOMEN, THIGH, OR UPPER ARM AT ANY TIME OF DAY ON THE SAME DAY EACH WEEK) DISCARD PEN 56 DAYS AFTER FIRST USE, Disp: 9 mL, Rfl: 3   tiZANidine (ZANAFLEX) 4 MG tablet, Take 0.5-1 tablets (2-4 mg total) by mouth every 8  (eight) hours as needed for muscle spasms., Disp: 30 tablet, Rfl: 0   valsartan (DIOVAN) 320 MG tablet, Take 1 tablet (320 mg total) by mouth daily., Disp: 90 tablet, Rfl: 3 Social History   Socioeconomic History   Marital status: Married    Spouse name: [name]   Number of children: 1   Years of education: 13   Highest education level: Some college, no degree  Occupational History   Occupation: retired     Comment: CNA   Tobacco Use   Smoking status: Former    Packs/day: 0.50    Years: 34.00    Additional pack years: 0.00  Total pack years: 17.00    Types: Cigarettes    Quit date: 06/01/1994    Years since quitting: 28.6   Smokeless tobacco: Never  Vaping Use   Vaping Use: Never used  Substance and Sexual Activity   Alcohol use: No   Drug use: No   Sexual activity: Yes    Birth control/protection: Surgical    Comment: hyst  Other Topics Concern   Not on file  Social History Narrative   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 [place]   Social Determinants of Health   Financial Resource Strain: Low Risk  (07/10/2022)   Overall Financial Resource Strain (CARDIA)    Difficulty of Paying Living Expenses: Not hard at all  Food Insecurity: No Food Insecurity (07/10/2022)   Hunger Vital Sign    Worried About Running Out of Food in the Last Year: Never true    Ran Out of Food in the Last Year: Never true  Transportation Needs: No Transportation Needs (07/10/2022)   PRAPARE - Hydrologist (Medical): No    Lack of Transportation (Non-Medical): No  Physical Activity: Sufficiently Active (07/10/2022)   Exercise Vital Sign    Days of Exercise per Week: 3 days    Minutes of Exercise per Session: 60 min  Stress: No Stress Concern Present (07/10/2022)   [place]    Feeling of Stress : Not at all  Social Connections: [place] (07/10/2022)    Social Connection and Isolation Panel [NHANES]    Frequency of Communication with Friends and Family: More than three times a week    Frequency of Social Gatherings with Friends and Family: More than three times a week    Attends Religious Services: More than 4 times per year    Active Member of [employer] or Organizations: Yes    Attends Music therapist: More than 4 times per year    Marital Status: Married  Human resources officer Violence: Not At Risk (07/10/2022)   Humiliation, Afraid, Rape, and Kick questionnaire    Fear of Current or Ex-Partner: No    Emotionally Abused: No    Physically Abused: No    Sexually Abused: No   Family History  Problem Relation Age of Onset   Parkinsonism Mother    Dementia Mother    Colon cancer Father 4       MULTIPLE CO-MORBIDITIES   Cancer Father    Diabetes Paternal Aunt    Diabetes Paternal Uncle    Diabetes Paternal Grandmother    Congestive Heart Failure Paternal Grandfather    Congestive Heart Failure Maternal Grandmother    Stroke Maternal Grandfather    Colon polyps Neg Hx     Objective: Office vital signs reviewed. BP 118/61   Pulse (!) 50   Temp 98.6 F (37 C)   Ht 5\' 4"  (1.626 m)   Wt 195 lb (88.5 kg)   SpO2 97%   BMI 33.47 kg/m   Physical Examination:  General: Awake, alert, well nourished, No acute distress HEENT: Sclera white.  Moist mucous membranes. Cardio: Bradycardic with regular rhythm, S1S2 heard, no murmurs appreciated Pulm: clear to auscultation bilaterally, no wheezes, rhonchi or rales; normal work of breathing on room air MSK: Gait slightly antalgic but ambulating independently Neuro: see DM foot  Diabetic Foot Exam - Simple   Simple Foot Form Diabetic Foot exam was performed with the following findings: Yes  01/30/2023 12:58 PM  Visual Inspection No deformities, no ulcerations, no other skin breakdown bilaterally: Yes Sensation Testing See comments: Yes Pulse Check Posterior Tibialis and Dorsalis  pulse intact bilaterally: Yes Comments Absent monofilament sensation in the pinky toe on the right.   Psych: Mood stable, speech normal, affect flat     01/30/2023    8:38 AM 10/01/2022    8:43 AM 07/10/2022    4:28 PM  Depression screen PHQ 2/9  Decreased Interest 0 0 0  Down, Depressed, Hopeless 0 0 1  PHQ - 2 Score 0 0 1  Altered sleeping 1 1   Tired, decreased energy 1 1   Change in appetite 0 0   Feeling bad or failure about yourself  0 0   Trouble concentrating 0 0   Moving slowly or fidgety/restless 0 0   Suicidal thoughts 0 0   PHQ-9 Score 2 2   Difficult doing work/chores Not difficult at all Somewhat difficult       01/30/2023    8:38 AM 10/01/2022    8:43 AM 09/25/2021    1:40 PM 05/25/2021    1:27 PM  GAD 7 : Generalized Anxiety Score  Nervous, Anxious, on Edge 1 1 1  0  Control/stop worrying 0 0 0 0  Worry too much - different things 0 0 0 0  Trouble relaxing 1 1 1  0  Restless 0 0 0 0  Easily annoyed or irritable 0 1 1 0  Afraid - awful might happen 0 0 0 0  Total GAD 7 Score 2 3 3  0  Anxiety Difficulty Not difficult at all Not difficult at all Not difficult at all Not difficult at all    Assessment/ Plan: 77 y.o. female   Type 2 diabetes mellitus with other specified complication, with long-term current use of insulin ([place]) - Plan: Bayer DCA Hb A1c Waived, Continuous Blood Gluc Receiver (FREESTYLE LIBRE READER) DEVI, Continuous Blood Gluc Sensor (FREESTYLE LIBRE 3 SENSOR) MISC, Insulin Pen Needle (PEN NEEDLES) 31G X 6 MM MISC, insulin glargine (LANTUS SOLOSTAR) 100 UNIT/ML Solostar Pen  Hyperlipidemia associated with type 2 diabetes mellitus ([place])  Hypertension associated with diabetes (HCC)  Statin myopathy  Depression with anxiety - Plan: LORazepam (ATIVAN) 1 MG tablet  Chronic use of benzodiazepine for therapeutic purpose - Plan: LORazepam (ATIVAN) 1 MG tablet  Chronic left-sided low back pain with left-sided sciatica - Plan: methylPREDNISolone  acetate (DEPO-MEDROL) injection 40 mg, Ambulatory referral to Physical Therapy  Sugar remains well-controlled.  I think that she is having too many hypoglycemic episodes so I would like her to reduce her Lantus to 20 units twice daily.  I have sent her in freestyle Elizabeth sensors as well.  Pen needles renewed.  Would like to see her back in 3 months  Continue Repatha for cholesterol control  Continue Norvasc, metoprolol and valsartan for blood pressure control  Continue Prozac.  Continue to use Ativan sparingly if needed.  UDS and CSA are up-to-date and the national narcotic database reviewed and there were no red flags  Unfortunately she continues to suffer from left-sided hip pain and sciatica.  She was in physical therapy but this was held due to acute illness with COVID.  I have placed order to resume physical therapy as well as given her Depo-Medrol injection intramuscularly today in efforts to alleviate her pain.  No orders of the defined types were placed in this encounter.  No orders of the defined types were placed in this encounter.  Janora Norlander, DO [facility] [phone]

## 2023-02-06 ENCOUNTER — Telehealth: Payer: Self-pay | Admitting: Family Medicine

## 2023-02-06 NOTE — Telephone Encounter (Signed)
Freestyle libre 3 is what was sent in... so do they have it or no?

## 2023-02-06 NOTE — Telephone Encounter (Signed)
Pt got a letter in the mail saying that Diana Pratt does not carry the [facility] meter that was faxed in. [facility] has Freestyle libre 3 sensor Dexcom G6 transmitter and G6 and G7 senor.

## 2023-02-07 NOTE — Telephone Encounter (Signed)
Left vm for cb

## 2023-02-08 NOTE — Telephone Encounter (Signed)
Attempted to call pt 2nd time no answer  

## 2023-02-13 ENCOUNTER — Encounter: Payer: Self-pay | Admitting: Physical Therapy

## 2023-02-13 ENCOUNTER — Ambulatory Visit: Payer: Medicare Other | Attending: Family Medicine | Admitting: Physical Therapy

## 2023-02-13 ENCOUNTER — Other Ambulatory Visit: Payer: Self-pay

## 2023-02-13 DIAGNOSIS — M5442 Lumbago with sciatica, left side: Secondary | ICD-10-CM | POA: Diagnosis not present

## 2023-02-13 DIAGNOSIS — M5459 Other low back pain: Secondary | ICD-10-CM | POA: Insufficient documentation

## 2023-02-13 DIAGNOSIS — G8929 Other chronic pain: Secondary | ICD-10-CM | POA: Insufficient documentation

## 2023-02-13 DIAGNOSIS — R293 Abnormal posture: Secondary | ICD-10-CM | POA: Insufficient documentation

## 2023-02-13 DIAGNOSIS — M62838 Other muscle spasm: Secondary | ICD-10-CM | POA: Diagnosis not present

## 2023-02-13 NOTE — Therapy (Addendum)
OUTPATIENT PHYSICAL THERAPY THORACOLUMBAR EVALUATION   Patient Name: Diana Pratt MRN: [id number] DOB:1946/07/28, 77 y.o., female Today's Date: 02/13/2023  END OF SESSION:  PT End of Session - 02/13/23 1019     Visit Number 1    Number of Visits 8    Date for PT Re-Evaluation 05/14/23    Authorization Type FOTO AT LEAST EVERY 5TH VISIT.  PROGRESS NOTE AT 10TH VISIT.  KX MODIFIER AFTER 15 VISITS.    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 Kevin Fenton   Depression    Diabetes mellitus    Diabetic neuropathy    GERD (gastroesophageal reflux disease)    occ heartburn   Humerus fracture    right   Hyperlipemia    Hypertension    Interstitial cystitis    Osteoporosis    Pneumonia    15 yrs ago   PONV (postoperative nausea and vomiting)    Shortness of breath dyspnea    Past Surgical History:  Procedure Laterality Date   ABDOMINAL HYSTERECTOMY  03/1984   partial   COLONOSCOPY N/A 07/19/2014   Procedure: COLONOSCOPY;  Surgeon: West Bali, MD;  Location: [facility] ENDO SUITE;  Service: Endoscopy;  Laterality: N/A;  9:30   REVERSE SHOULDER ARTHROPLASTY Right 06/14/2016   Procedure: REVERSE SHOULDER ARTHROPLASTY;  Surgeon: Francena Hanly, MD;  Location: [facility] OR;  Service: Orthopedics;  Laterality: Right;   Skin cancer removed  08/07/2016   left ear   Patient Active Problem List   Diagnosis Date Noted   Osteopenia of neck of left femur 10/01/2022   Chronic use of benzodiazepine for therapeutic purpose 10/01/2022   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   Anxiety 07/26/2017   Vaginal atrophy 04/10/2017   Urinary incontinence  04/10/2017   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   COPD (chronic obstructive pulmonary disease)    Asthma    Interstitial cystitis    Unspecified vitamin D deficiency 05/29/2013   Lumbar radiculopathy 05/29/2013   Depression with anxiety 02/22/2011   Diabetes mellitus 02/22/2011   Hyperlipidemia associated with type 2 diabetes mellitus 02/22/2011   Hypertension associated with diabetes 02/22/2011    REFERRING PROVIDER: Delynn Flavin DO  REFERRING DIAG: Chronic left-sided low back pain with left-sided Sciatica  Rationale for Evaluation and Treatment: Rehabilitation  THERAPY DIAG:  Other low back pain  Other muscle spasm  Abnormal posture  ONSET DATE: ~03/2022.  SUBJECTIVE:  SUBJECTIVE STATEMENT: The patient returns to PT with c/o left low back and buttock pain and occasionally into posterior thigh. She was last seen on 11/01/22 but was not able to continue due to illness.  She states she was doing "a whole lot better then."  Her pain is rated at an 8/10 today.  The pain is described as shooting.  Increased walking and standing increases her pain.  Tylenol, Biofreeze, heating pads and dry needling decrease her pain.    PERTINENT HISTORY:  TSA, OP, HTN.  PAIN:  Are you having pain? Yes: NPRS scale: 8/10 Pain location: Left low back and buttock. Pain description: Shooting. Aggravating factors: As above. Relieving factors: As above.  PRECAUTIONS: Other: OP.  WEIGHT BEARING RESTRICTIONS: No  FALLS:  Has patient fallen in last 6 months? No  LIVING ENVIRONMENT: Lives with: lives with their spouse Lives in: House/apartment Has following equipment at home: None  OCCUPATION: Retired.  PLOF: Independent with basic ADLs  PATIENT GOALS: Be able to do more with less  pain.    OBJECTIVE:   PATIENT SURVEYS:  FOTO 52.  POSTURE: rounded shoulders, forward head, decreased lumbar lordosis, and posterior pelvic tilt  PALPATION: Mild c/o pain in left lower lumbar region but most pain is over left Piriformis/Sciatic noth region.  LOWER EXTREMITY ROM:     WFL.  LOWER EXTREMITY MMT:    LE strength generally graded at 4+/5 bilaterally.  LUMBAR SPECIAL TESTS:  Increase pain with a left SLR when compared contralaterally.   GAIT: Slow and cautious gait pattern.  (-) Romberg test.  TODAY'S TREATMENT:                                                                                                                              DATE:  HMP and IFC at 80-150 Hx on 40% scan x 20 minutes to patient's left low back and buttock region.  Patient tolerated treatment without complaint with normal modality response following removal of modality.  ASSESSMENT:  CLINICAL IMPRESSION: The patient returns to OPPT with c/o left-sided low back and buttock pain.  She had done very well with her last session of PT but had to stop coming due to an extended illness.  Her CC is palpable pain in her left buttock region with pain reaching very high levels with prolonged standing and walking. Pain will travel into her left posterior thigh. She is motivated to get better and she wants to be able to do more with less pain.  She has increased pain with a left SLR test.  Patient will benefit from skilled physical therapy intervention to address pain and deficits.  OBJECTIVE IMPAIRMENTS: Abnormal gait, decreased activity tolerance, increased muscle spasms, postural dysfunction, and pain.   ACTIVITY LIMITATIONS: carrying, lifting, bending, standing, and locomotion level  PARTICIPATION LIMITATIONS: meal prep, cleaning, and laundry  PERSONAL FACTORS: Time since onset of injury/illness/exacerbation are also affecting patient's functional outcome.   REHAB POTENTIAL: Good  CLINICAL  DECISION MAKING: Stable/uncomplicated  EVALUATION COMPLEXITY: Low   GOALS:  LONG TERM GOALS: Target date: 05/14/23.  Ind with a HEP.  Goal status: INITIAL  2.  Stand 20 minutes with pain not > 3/10.  Goal status: INITIAL  3.  Perform ADL's with pain not > 3-4/10.  Goal status: INITIAL  4.  Eliminate left LE pain.  Goal status: INITIAL  PLAN:  PT FREQUENCY: 2x/week  PT DURATION: 4 weeks  PLANNED INTERVENTIONS: Therapeutic exercises, Therapeutic activity, Neuromuscular re-education, Patient/Family education, Self Care, Dry Needling, Electrical stimulation, Cryotherapy, Moist heat, Ultrasound, and Manual therapy.  PLAN FOR NEXT SESSION: Dry needling, STW/M, core exercise progression, heat, e' stim.   Sophiya Morello, [place], PT 02/13/2023, 11:09 AM

## 2023-02-18 ENCOUNTER — Ambulatory Visit: Payer: Medicare Other

## 2023-02-18 DIAGNOSIS — G8929 Other chronic pain: Secondary | ICD-10-CM | POA: Diagnosis not present

## 2023-02-18 DIAGNOSIS — R293 Abnormal posture: Secondary | ICD-10-CM | POA: Diagnosis not present

## 2023-02-18 DIAGNOSIS — M5459 Other low back pain: Secondary | ICD-10-CM

## 2023-02-18 DIAGNOSIS — M62838 Other muscle spasm: Secondary | ICD-10-CM | POA: Diagnosis not present

## 2023-02-18 DIAGNOSIS — M5442 Lumbago with sciatica, left side: Secondary | ICD-10-CM | POA: Diagnosis not present

## 2023-02-18 NOTE — Therapy (Signed)
OUTPATIENT PHYSICAL THERAPY THORACOLUMBAR EVALUATION   Patient Name: Diana Pratt MRN: [id number] DOB:1946-05-29, 77 y.o., female Today's Date: 02/18/2023  END OF SESSION:  PT End of Session - 02/18/23 0949     Visit Number 2    Number of Visits 8    Date for PT Re-Evaluation 05/14/23    Authorization Type FOTO AT LEAST EVERY 5TH VISIT.  PROGRESS NOTE AT 10TH VISIT.  KX MODIFIER AFTER 15 VISITS.    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 Kevin Fenton   Depression    Diabetes mellitus    Diabetic neuropathy    GERD (gastroesophageal reflux disease)    occ heartburn   Humerus fracture    right   Hyperlipemia    Hypertension    Interstitial cystitis    Osteoporosis    Pneumonia    15 yrs ago   PONV (postoperative nausea and vomiting)    Shortness of breath dyspnea    Past Surgical History:  Procedure Laterality Date   ABDOMINAL HYSTERECTOMY  03/1984   partial   COLONOSCOPY N/A 07/19/2014   Procedure: COLONOSCOPY;  Surgeon: West Bali, MD;  [facility]: [facility];  Service: Endoscopy;  Laterality: N/A;  9:30   REVERSE SHOULDER ARTHROPLASTY Right 06/14/2016   Procedure: REVERSE SHOULDER ARTHROPLASTY;  Surgeon: Francena Hanly, MD;  Location: [facility];  Service: Orthopedics;  Laterality: Right;   Skin cancer removed  08/07/2016   left ear   Patient Active Problem List   Diagnosis Date Noted   Osteopenia of neck of left femur 10/01/2022   Chronic use of benzodiazepine for therapeutic purpose 10/01/2022   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   Anxiety 07/26/2017   Vaginal atrophy 04/10/2017   Urinary incontinence 04/10/2017   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    COPD (chronic obstructive pulmonary disease)    Asthma    Interstitial cystitis    Unspecified vitamin D deficiency 05/29/2013   Lumbar radiculopathy 05/29/2013   Depression with anxiety 02/22/2011   Diabetes mellitus 02/22/2011   Hyperlipidemia associated with type 2 diabetes mellitus 02/22/2011   Hypertension associated with diabetes 02/22/2011    REFERRING PROVIDER: Delynn Flavin DO  REFERRING DIAG: Chronic left-sided low back pain with left-sided Sciatica  Rationale for Evaluation and Treatment: Rehabilitation  THERAPY DIAG:  Other low back pain  Other muscle spasm  Abnormal posture  ONSET DATE: ~03/2022.  SUBJECTIVE:  SUBJECTIVE STATEMENT: Pt reports 6/10 left low back pain today.  PERTINENT HISTORY:  TSA, OP, HTN.  PAIN:  Are you having pain? Yes: NPRS scale: 6/10 Pain location: Left low back and buttock. Pain description: Shooting. Aggravating factors: As above. Relieving factors: As above.  PRECAUTIONS: Other: OP.  WEIGHT BEARING RESTRICTIONS: No  FALLS:  Has patient fallen in last 6 months? No  LIVING ENVIRONMENT: Lives with: lives with their spouse Lives in: House/apartment Has following equipment at home: None  OCCUPATION: Retired.  PLOF: Independent with basic ADLs  PATIENT GOALS: Be able to do more with less pain.    OBJECTIVE:   PATIENT SURVEYS:  FOTO 52.  POSTURE: rounded shoulders, forward head, decreased lumbar lordosis, and posterior pelvic tilt  PALPATION: Mild c/o pain in left lower lumbar region but most pain is over left Piriformis/Sciatic noth region.  LOWER EXTREMITY ROM:     WFL.  LOWER EXTREMITY MMT:    LE strength generally graded at 4+/5 bilaterally.  LUMBAR SPECIAL TESTS:  Increase pain with a left SLR when compared  contralaterally.   GAIT: Slow and cautious gait pattern.  (-) Romberg test.  TODAY'S TREATMENT:                                                                                                                              DATE:                                       EXERCISE LOG  Exercise Repetitions and Resistance Comments  Nustep Lvl 2 x 15 mins   [employer] X3 mins with 3 sec hold   [employer] X3 mins with 3 sec hold        Blank cell = exercise not performed today   Manual Therapy Soft Tissue Mobilization: left lumbar, STW/M to left piriformis and glute to decrease pain and tone with pt positioned in right side-lying for comfort with pillow between her knees    Modalities  Date:  Unattended Estim: Lumbar, IFC 80-150 Hz, 15 mins, Pain Hot Pack: Lumbar, 15 mins, Pain and Tone  ASSESSMENT:  CLINICAL IMPRESSION: Pt arrives for today's treatment session reporting 6/10 left low back pain.  Pt able to tolerate Nustep for warm up today without complaint of pain.  Pt instructed in standing physio ball exercises with min cues for proper technique.  STW/M performed to left piriformis and glute to decrease pain and tone.  Normal responses to estim and MH noted upon removal.  Pt reported decreased pain at completion of today's treatment session.   OBJECTIVE IMPAIRMENTS: Abnormal gait, decreased activity tolerance, increased muscle spasms, postural dysfunction, and pain.   ACTIVITY LIMITATIONS: carrying, lifting, bending, standing, and locomotion level  PARTICIPATION LIMITATIONS: meal prep, cleaning, and laundry  PERSONAL FACTORS: Time since onset of injury/illness/exacerbation are also affecting patient's functional outcome.  REHAB POTENTIAL: Good  CLINICAL DECISION MAKING: Stable/uncomplicated  EVALUATION COMPLEXITY: Low   GOALS:  LONG TERM GOALS: Target date: 05/14/23.  Ind with a HEP.  Goal status: INITIAL  2.  Stand 20 minutes with pain not > 3/10.  Goal status:  INITIAL  3.  Perform ADL's with pain not > 3-4/10.  Goal status: INITIAL  4.  Eliminate left LE pain.  Goal status: INITIAL  PLAN:  PT FREQUENCY: 2x/week  PT DURATION: 4 weeks  PLANNED INTERVENTIONS: Therapeutic exercises, Therapeutic activity, Neuromuscular re-education, Patient/Family education, Self Care, Dry Needling, Electrical stimulation, Cryotherapy, Moist heat, Ultrasound, and Manual therapy.  PLAN FOR NEXT SESSION: Dry needling, STW/M, core exercise progression, heat, e' stim.   Newman Pies, PTA 02/18/2023, 10:45 AM

## 2023-02-20 ENCOUNTER — Ambulatory Visit: Payer: Medicare Other | Admitting: Physical Therapy

## 2023-02-20 DIAGNOSIS — R293 Abnormal posture: Secondary | ICD-10-CM | POA: Diagnosis not present

## 2023-02-20 DIAGNOSIS — G8929 Other chronic pain: Secondary | ICD-10-CM | POA: Diagnosis not present

## 2023-02-20 DIAGNOSIS — M5442 Lumbago with sciatica, left side: Secondary | ICD-10-CM | POA: Diagnosis not present

## 2023-02-20 DIAGNOSIS — M5459 Other low back pain: Secondary | ICD-10-CM | POA: Diagnosis not present

## 2023-02-20 DIAGNOSIS — M62838 Other muscle spasm: Secondary | ICD-10-CM

## 2023-02-20 NOTE — Therapy (Addendum)
OUTPATIENT PHYSICAL THERAPY THORACOLUMBAR EVALUATION   Patient Name: Diana Pratt MRN: [id number] DOB:[birth year], 77 y.o., female Today's Date: 02/20/2023  END OF SESSION:  PT End of Session - 02/20/23 0946     Visit Number 3    Number of Visits 8    Date for PT Re-Evaluation 05/14/23    Authorization Type FOTO AT LEAST EVERY 5TH VISIT.  PROGRESS NOTE AT 10TH VISIT.  KX MODIFIER AFTER 15 VISITS.    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 Kevin Fenton   Depression    Diabetes mellitus    Diabetic neuropathy    GERD (gastroesophageal reflux disease)    occ heartburn   Humerus fracture    right   Hyperlipemia    Hypertension    Interstitial cystitis    Osteoporosis    Pneumonia    15 yrs ago   PONV (postoperative nausea and vomiting)    Shortness of breath dyspnea    Past Surgical History:  Procedure Laterality Date   ABDOMINAL HYSTERECTOMY  03/1984   partial   COLONOSCOPY N/A 07/19/2014   Procedure: COLONOSCOPY;  Surgeon: West Bali, MD;  [facility];  Service: Endoscopy;  Laterality: N/A;  9:30   REVERSE SHOULDER ARTHROPLASTY Right 06/14/2016   Procedure: REVERSE SHOULDER ARTHROPLASTY;  Surgeon: Francena Hanly, MD;  Location: [facility];  Service: Orthopedics;  Laterality: Right;   Skin cancer removed  08/07/2016   left ear   Patient Active Problem List   Diagnosis Date Noted   Osteopenia of neck of left femur 10/01/2022   Chronic use of benzodiazepine for therapeutic purpose 10/01/2022   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   Anxiety 07/26/2017   Vaginal atrophy 04/10/2017   Urinary incontinence  04/10/2017   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   COPD (chronic obstructive pulmonary disease)    Asthma    Interstitial cystitis    Unspecified vitamin D deficiency 05/29/2013   Lumbar radiculopathy 05/29/2013   Depression with anxiety 02/22/2011   Diabetes mellitus 02/22/2011   Hyperlipidemia associated with type 2 diabetes mellitus 02/22/2011   Hypertension associated with diabetes 02/22/2011    REFERRING PROVIDER: Delynn Flavin DO  REFERRING DIAG: Chronic left-sided low back pain with left-sided Sciatica  Rationale for Evaluation and Treatment: Rehabilitation  THERAPY DIAG:  Other low back pain  Other muscle spasm  ONSET DATE: ~03/2022.  SUBJECTIVE:  SUBJECTIVE STATEMENT: Pt reports 6/10 left low back pain today and left hip.  PERTINENT HISTORY:  TSA, OP, HTN.  PAIN:  Are you having pain? Yes: NPRS scale: 6/10 Pain location: Left low back and buttock. Pain description: Shooting. Aggravating factors: As above. Relieving factors: As above.  PRECAUTIONS: Other: OP.  WEIGHT BEARING RESTRICTIONS: No  FALLS:  Has patient fallen in last 6 months? No  LIVING ENVIRONMENT: Lives with: lives with their spouse Lives in: House/apartment Has following equipment at home: None  OCCUPATION: Retired.  PLOF: Independent with basic ADLs  PATIENT GOALS: Be able to do more with less pain.    OBJECTIVE:   PATIENT SURVEYS:  FOTO 52.  POSTURE: rounded shoulders, forward head, decreased lumbar lordosis, and posterior pelvic tilt  PALPATION: Mild c/o pain in left lower lumbar region but most pain is over left Piriformis/Sciatic noth region.  LOWER EXTREMITY ROM:     WFL.  LOWER EXTREMITY MMT:    LE strength generally graded at 4+/5 bilaterally.  LUMBAR SPECIAL  TESTS:  Increase pain with a left SLR when compared contralaterally.   GAIT: Slow and cautious gait pattern.  (-) Romberg test.  TODAY'S TREATMENT:                                                                                                                              DATE:                                       EXERCISE LOG  Exercise Repetitions and Resistance Comments  Nustep Lvl 2 x 18 mins   Trigger Point Dry-Needling  Treatment instructions: Expect mild to moderate muscle soreness. S/S of pneumothorax if dry needled over a lung field, and to seek immediate medical attention should they occur. Patient verbalized understanding of these instructions and education.  Patient Consent Given: Yes Education handout provided: Yes Muscles treated: Left TFL  STW/M x 5 minutes with ischemic release to left TFL f/b HMP and IFC at 80-150 Hz on 40% scan x 20 minutes.  Patient tolerated treatment without complaint with normal modality response following removal of modality.   ASSESSMENT:  CLINICAL IMPRESSION: Patient tender to palpation over her left TFL.  Good response to dry needling to this muscle.  Pain at a 4 at treatment.  OBJECTIVE IMPAIRMENTS: Abnormal gait, decreased activity tolerance, increased muscle spasms, postural dysfunction, and pain.   ACTIVITY LIMITATIONS: carrying, lifting, bending, standing, and locomotion level  PARTICIPATION LIMITATIONS: meal prep, cleaning, and laundry  PERSONAL FACTORS: Time since onset of injury/illness/exacerbation are also affecting patient's functional outcome.   REHAB POTENTIAL: Good  CLINICAL DECISION MAKING: Stable/uncomplicated  EVALUATION COMPLEXITY: Low   GOALS:  LONG TERM GOALS: Target date: 05/14/23.  Ind with a HEP.  Goal status: INITIAL  2.  Stand 20 minutes with pain not > 3/10.  Goal status: INITIAL  3.  Perform ADL's with pain not > 3-4/10.  Goal status: INITIAL  4.  Eliminate left LE pain.  Goal status:  INITIAL  PLAN:  PT FREQUENCY: 2x/week  PT DURATION: 4 weeks  PLANNED INTERVENTIONS: Therapeutic exercises, Therapeutic activity, Neuromuscular re-education, Patient/Family education, Self Care, Dry Needling, Electrical stimulation, Cryotherapy, Moist heat, Ultrasound, and Manual therapy.  PLAN FOR NEXT SESSION: Dry needling, STW/M, core exercise progression, heat, e' stim.   Verdell Dykman, [place], PT 02/20/2023, 9:56 AM

## 2023-02-25 ENCOUNTER — Ambulatory Visit: Payer: Medicare Other | Admitting: Physical Therapy

## 2023-02-25 ENCOUNTER — Encounter: Payer: Self-pay | Admitting: Physical Therapy

## 2023-02-25 DIAGNOSIS — G8929 Other chronic pain: Secondary | ICD-10-CM | POA: Diagnosis not present

## 2023-02-25 DIAGNOSIS — M62838 Other muscle spasm: Secondary | ICD-10-CM

## 2023-02-25 DIAGNOSIS — M5459 Other low back pain: Secondary | ICD-10-CM

## 2023-02-25 DIAGNOSIS — R293 Abnormal posture: Secondary | ICD-10-CM | POA: Diagnosis not present

## 2023-02-25 DIAGNOSIS — M5442 Lumbago with sciatica, left side: Secondary | ICD-10-CM | POA: Diagnosis not present

## 2023-02-25 NOTE — Therapy (Signed)
OUTPATIENT PHYSICAL THERAPY THORACOLUMBAR TREATMENT   Patient Name: Diana Pratt MRN: [id number] DOB:10/03/1946, 77 y.o., female Today's Date: 02/25/2023  END OF SESSION:  PT End of Session - 02/25/23 0955     Visit Number 4    Number of Visits 8    Date for PT Re-Evaluation 05/14/23    Authorization Type FOTO AT LEAST EVERY 5TH VISIT.  PROGRESS NOTE AT 10TH VISIT.  KX MODIFIER AFTER 15 VISITS.    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 Kevin Fenton   Depression    Diabetes mellitus    Diabetic neuropathy    GERD (gastroesophageal reflux disease)    occ heartburn   Humerus fracture    right   Hyperlipemia    Hypertension    Interstitial cystitis    Osteoporosis    Pneumonia    15 yrs ago   PONV (postoperative nausea and vomiting)    Shortness of breath dyspnea    Past Surgical History:  Procedure Laterality Date   ABDOMINAL HYSTERECTOMY  03/1984   partial   COLONOSCOPY N/A 07/19/2014   Procedure: COLONOSCOPY;  Surgeon: West Bali, MD;  [facility];  Service: Endoscopy;  Laterality: N/A;  9:30   REVERSE SHOULDER ARTHROPLASTY Right 06/14/2016   Procedure: REVERSE SHOULDER ARTHROPLASTY;  Surgeon: Francena Hanly, MD;  Location: [facility];  Service: Orthopedics;  Laterality: Right;   Skin cancer removed  08/07/2016   left ear   Patient Active Problem List   Diagnosis Date Noted   Osteopenia of neck of left femur 10/01/2022   Chronic use of benzodiazepine for therapeutic purpose 10/01/2022   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   Anxiety 07/26/2017   Vaginal atrophy 04/10/2017   Urinary incontinence  04/10/2017   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   COPD (chronic obstructive pulmonary disease)    Asthma    Interstitial cystitis    Unspecified vitamin D deficiency 05/29/2013   Lumbar radiculopathy 05/29/2013   Depression with anxiety 02/22/2011   Diabetes mellitus 02/22/2011   Hyperlipidemia associated with type 2 diabetes mellitus 02/22/2011   Hypertension associated with diabetes 02/22/2011   REFERRING PROVIDER: Delynn Flavin DO  REFERRING DIAG: Chronic left-sided low back pain with left-sided Sciatica  Rationale for Evaluation and Treatment: Rehabilitation  THERAPY DIAG:  Other low back pain  Other muscle spasm  Abnormal posture  ONSET DATE: ~03/2022.  SUBJECTIVE:  SUBJECTIVE STATEMENT: Reports a stabbing pain in L central hip but starts in low back.  PERTINENT HISTORY:  TSA, OP, HTN.  PAIN:  Are you having pain? Yes: NPRS scale: 6/10 Pain location: Left low back and buttock. Pain description: Shabbing Aggravating factors: Static standing Relieving factors: As above.  PRECAUTIONS: Other: OP.  PATIENT GOALS: Be able to do more with less pain.  OBJECTIVE:   PATIENT SURVEYS:  FOTO 52.  POSTURE: rounded shoulders, forward head, decreased lumbar lordosis, and posterior pelvic tilt  PALPATION: Mild c/o pain in left lower lumbar region but most pain is over left Piriformis/Sciatic noth region.  LOWER EXTREMITY ROM:  WFL.  LOWER EXTREMITY MMT:   LE strength generally graded at 4+/5 bilaterally.  LUMBAR SPECIAL TESTS:  Increase pain with a left SLR when compared contralaterally.  GAIT: Slow and cautious gait pattern.  (-) Romberg test.  TODAY'S TREATMENT:                                                                                                                               DATE:  02/25/23                                    EXERCISE LOG  Exercise Repetitions and Resistance Comments  Nustep L4 x15 min                    Blank cell = exercise not performed today   Modalities  Date: 02/25/23 Unattended Estim: Hip, IFC, 15 mins, Pain Combo: Hip, 1.5 w/cm2, 100%, 1 mhz, 10 mins, Pain Hot Pack: Hip, 15 mins, Pain  ASSESSMENT:  CLINICAL IMPRESSION: Patient continues to present in clinic with high level LBP which radiates to L hip. Patient indicating palpable pain in lateral glutes/TFL region. Pain is greater with prolonged standing especially but walking as well. Normal modalities response noted following removal of the modalities.  OBJECTIVE IMPAIRMENTS: Abnormal gait, decreased activity tolerance, increased muscle spasms, postural dysfunction, and pain.   ACTIVITY LIMITATIONS: carrying, lifting, bending, standing, and locomotion level  PARTICIPATION LIMITATIONS: meal prep, cleaning, and laundry  PERSONAL FACTORS: Time since onset of injury/illness/exacerbation are also affecting patient's functional outcome.   REHAB POTENTIAL: Good  CLINICAL DECISION MAKING: Stable/uncomplicated  EVALUATION COMPLEXITY: Low  GOALS:  LONG TERM GOALS: Target date: 05/14/23.  Ind with a HEP.  Goal status: INITIAL  2.  Stand 20 minutes with pain not > 3/10.  Goal status: INITIAL  3.  Perform ADL's with pain not > 3-4/10.  Goal status: INITIAL  4.  Eliminate left LE pain.  Goal status: INITIAL  PLAN:  PT FREQUENCY: 2x/week  PT DURATION: 4 weeks  PLANNED INTERVENTIONS: Therapeutic exercises, Therapeutic activity, Neuromuscular re-education, Patient/Family education, Self Care, Dry Needling, Electrical stimulation, Cryotherapy, Moist heat, Ultrasound, and Manual therapy.  PLAN FOR NEXT SESSION: Dry needling, STW/M, core exercise progression,  heat, e' stim.  Marvell Fuller, PTA 02/25/2023, 10:41 AM

## 2023-02-26 NOTE — Telephone Encounter (Signed)
Attempted to call pt , no answer closing call  

## 2023-02-27 ENCOUNTER — Ambulatory Visit: Payer: Medicare Other | Admitting: Physical Therapy

## 2023-02-27 DIAGNOSIS — G8929 Other chronic pain: Secondary | ICD-10-CM | POA: Diagnosis not present

## 2023-02-27 DIAGNOSIS — M5459 Other low back pain: Secondary | ICD-10-CM

## 2023-02-27 DIAGNOSIS — M62838 Other muscle spasm: Secondary | ICD-10-CM | POA: Diagnosis not present

## 2023-02-27 DIAGNOSIS — R293 Abnormal posture: Secondary | ICD-10-CM | POA: Diagnosis not present

## 2023-02-27 DIAGNOSIS — M5442 Lumbago with sciatica, left side: Secondary | ICD-10-CM | POA: Diagnosis not present

## 2023-02-27 NOTE — Therapy (Signed)
OUTPATIENT PHYSICAL THERAPY THORACOLUMBAR TREATMENT   Patient Name: Diana Pratt MRN: [id number] DOB:[birth year], 77 y.o., female Today's Date: 02/27/2023  END OF SESSION:  PT End of Session - 02/27/23 1004     Visit Number 5    Number of Visits 8    Date for PT Re-Evaluation 05/14/23    Authorization Type FOTO AT LEAST EVERY 5TH VISIT.  PROGRESS NOTE AT 10TH VISIT.  KX MODIFIER AFTER 15 VISITS.    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 Kevin Fenton   Depression    Diabetes mellitus    Diabetic neuropathy    GERD (gastroesophageal reflux disease)    occ heartburn   Humerus fracture    right   Hyperlipemia    Hypertension    Interstitial cystitis    Osteoporosis    Pneumonia    15 yrs ago   PONV (postoperative nausea and vomiting)    Shortness of breath dyspnea    Past Surgical History:  Procedure Laterality Date   ABDOMINAL HYSTERECTOMY  03/1984   partial   COLONOSCOPY N/A 07/19/2014   Procedure: COLONOSCOPY;  Surgeon: West Bali, MD;  [facility];  Service: Endoscopy;  Laterality: N/A;  9:30   REVERSE SHOULDER ARTHROPLASTY Right 06/14/2016   Procedure: REVERSE SHOULDER ARTHROPLASTY;  Surgeon: Francena Hanly, MD;  Location: [facility];  Service: Orthopedics;  Laterality: Right;   Skin cancer removed  08/07/2016   left ear   Patient Active Problem List   Diagnosis Date Noted   Osteopenia of neck of left femur 10/01/2022   Chronic use of benzodiazepine for therapeutic purpose 10/01/2022   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   Anxiety 07/26/2017   Vaginal atrophy 04/10/2017   Urinary incontinence  04/10/2017   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   COPD (chronic obstructive pulmonary disease)    Asthma    Interstitial cystitis    Unspecified vitamin D deficiency 05/29/2013   Lumbar radiculopathy 05/29/2013   Depression with anxiety 02/22/2011   Diabetes mellitus 02/22/2011   Hyperlipidemia associated with type 2 diabetes mellitus 02/22/2011   Hypertension associated with diabetes 02/22/2011   REFERRING PROVIDER: Delynn Flavin DO  REFERRING DIAG: Chronic left-sided low back pain with left-sided Sciatica  Rationale for Evaluation and Treatment: Rehabilitation  THERAPY DIAG:  Other low back pain  Other muscle spasm  Abnormal posture  ONSET DATE: ~03/2022.  SUBJECTIVE:  SUBJECTIVE STATEMENT: Reports pain minimally better than last PT but still having pain which she correlates to weather.  PERTINENT HISTORY:  TSA, OP, HTN.  PAIN:  Are you having pain? Yes: NPRS scale: 5/10 Pain location: Left low back and buttock. Pain description: Shabbing Aggravating factors: Static standing Relieving factors: As above.  PRECAUTIONS: Other: OP.  PATIENT GOALS: Be able to do more with less pain.  OBJECTIVE:   PATIENT SURVEYS:  FOTO 52.  POSTURE: rounded shoulders, forward head, decreased lumbar lordosis, and posterior pelvic tilt  PALPATION: Mild c/o pain in left lower lumbar region but most pain is over left Piriformis/Sciatic noth region.  LOWER EXTREMITY ROM:  WFL.  LOWER EXTREMITY MMT:   LE strength generally graded at 4+/5 bilaterally.  LUMBAR SPECIAL TESTS:  Increase pain with a left SLR when compared contralaterally.  GAIT: Slow and cautious gait pattern.  (-) Romberg test.  TODAY'S TREATMENT:                                                                                                                               DATE:  02/27/23                                    EXERCISE LOG  Exercise Repetitions and Resistance Comments  Nustep L4 x15 min                    Blank cell = exercise not performed today   Modalities  Date: 02/27/23 Unattended Estim: Hip, IFC, 15 mins, Pain Combo: Hip, 1.5 w/cm2, 100%, 1 mhz, 10 mins, Pain Hot Pack: Hip, 15 mins, Pain  ASSESSMENT:  CLINICAL IMPRESSION: Patient presented in clinic with reports of minimal reduced L hip pain but pain still shooting to L calf area. Patient able to tolerate Nustep and [place] but palpable PT noted in superior glute. Focus on combo [place] centered at superior glute/ SI joint. Patient reports that LLE shooting pain occurs if standing for several minutes. Her standing tolerance is approximately 15 minutes before pain becomes intense. ADLs pain rating indicated as 5/10 in L hip. Normal modalities response noted following removal of the modalities.  OBJECTIVE IMPAIRMENTS: Abnormal gait, decreased activity tolerance, increased muscle spasms, postural dysfunction, and pain.   ACTIVITY LIMITATIONS: carrying, lifting, bending, standing, and locomotion level  PARTICIPATION LIMITATIONS: meal prep, cleaning, and laundry  PERSONAL FACTORS: Time since onset of injury/illness/exacerbation are also affecting patient's functional outcome.   REHAB POTENTIAL: Good  CLINICAL DECISION MAKING: Stable/uncomplicated  EVALUATION COMPLEXITY: Low  GOALS:  LONG TERM GOALS: Target date: 05/14/23.  Ind with a HEP.  Goal status: On-going  2.  Stand 20 minutes with pain not > 3/10.  Goal status: On-going  3.  Perform ADL's with pain not > 3-4/10.  Goal status: On-going  4.  Eliminate left LE pain.  Goal status: On-going  PLAN:  PT FREQUENCY: 2x/week  PT DURATION: 4 weeks  PLANNED INTERVENTIONS: Therapeutic exercises, Therapeutic activity, Neuromuscular re-education, Patient/Family education, Self  Care, Dry Needling, Electrical stimulation, Cryotherapy, Moist heat, Ultrasound, and Manual therapy.  PLAN FOR NEXT SESSION: Dry needling, STW/M, core exercise progression, heat, e' stim.  Marvell Fuller, PTA 02/27/2023, 10:36 AM

## 2023-03-04 ENCOUNTER — Ambulatory Visit: Payer: Medicare Other

## 2023-03-04 DIAGNOSIS — R293 Abnormal posture: Secondary | ICD-10-CM

## 2023-03-04 DIAGNOSIS — M62838 Other muscle spasm: Secondary | ICD-10-CM

## 2023-03-04 DIAGNOSIS — M5459 Other low back pain: Secondary | ICD-10-CM

## 2023-03-04 DIAGNOSIS — M5442 Lumbago with sciatica, left side: Secondary | ICD-10-CM | POA: Diagnosis not present

## 2023-03-04 DIAGNOSIS — G8929 Other chronic pain: Secondary | ICD-10-CM | POA: Diagnosis not present

## 2023-03-04 NOTE — Therapy (Signed)
OUTPATIENT PHYSICAL THERAPY THORACOLUMBAR TREATMENT   Patient Name: Diana Pratt MRN: [id number] DOB:12/02/1945, 77 y.o., female Today's Date: 03/04/2023  END OF SESSION:  PT End of Session - 03/04/23 0947     Visit Number 6    Number of Visits 8    Date for PT Re-Evaluation 05/14/23    Authorization Type FOTO AT LEAST EVERY 5TH VISIT.  PROGRESS NOTE AT 10TH VISIT.  KX MODIFIER AFTER 15 VISITS.    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 Kevin Fenton   Depression    Diabetes mellitus    Diabetic neuropathy (HCC)    GERD (gastroesophageal reflux disease)    occ heartburn   Humerus fracture    right   Hyperlipemia    Hypertension    Interstitial cystitis    Osteoporosis    Pneumonia    15 yrs ago   PONV (postoperative nausea and vomiting)    Shortness of breath dyspnea    Past Surgical History:  Procedure Laterality Date   ABDOMINAL HYSTERECTOMY  03/1984   partial   COLONOSCOPY N/A 07/19/2014   Procedure: COLONOSCOPY;  Surgeon: West Bali, MD;  Location: [facility];  Service: Endoscopy;  Laterality: N/A;  9:30   REVERSE SHOULDER ARTHROPLASTY Right 06/14/2016   Procedure: REVERSE SHOULDER ARTHROPLASTY;  Surgeon: Francena Hanly, MD;  Location: [facility];  Service: Orthopedics;  Laterality: Right;   Skin cancer removed  08/07/2016   left ear   Patient Active Problem List   Diagnosis Date Noted   Osteopenia of neck of left femur 10/01/2022   Chronic use of benzodiazepine for therapeutic purpose 10/01/2022   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   Anxiety 07/26/2017   Vaginal atrophy 04/10/2017   Urinary  incontinence 04/10/2017   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   COPD (chronic obstructive pulmonary disease) (HCC)    Asthma    Interstitial cystitis    Unspecified vitamin D deficiency 05/29/2013   Lumbar radiculopathy 05/29/2013   Depression with anxiety 02/22/2011   Diabetes mellitus (HCC) 02/22/2011   Hyperlipidemia associated with type 2 diabetes mellitus (HCC) 02/22/2011   Hypertension associated with diabetes (HCC) 02/22/2011   REFERRING PROVIDER: Delynn Flavin DO  REFERRING DIAG: Chronic left-sided low back pain with left-sided Sciatica  Rationale for Evaluation and Treatment: Rehabilitation  THERAPY DIAG:  Other low back pain  Other muscle spasm  Abnormal posture  ONSET DATE: ~03/2022.  SUBJECTIVE:  SUBJECTIVE STATEMENT: Patient reports that her left hip is hurting today. She notes that it has been about the same since her last appointment.   PERTINENT HISTORY:  TSA, OP, HTN.  PAIN:  Are you having pain? Yes: NPRS scale: 5/10 Pain location: Left low back and buttock. Pain description: Shabbing Aggravating factors: Static standing Relieving factors: As above.  PRECAUTIONS: Other: OP.  PATIENT GOALS: Be able to do more with less pain.  OBJECTIVE:   PATIENT SURVEYS:  FOTO 39.48 on 03/04/23  POSTURE: rounded shoulders, forward head, decreased lumbar lordosis, and posterior pelvic tilt  PALPATION: Mild c/o pain in left lower lumbar region but most pain is over left Piriformis/Sciatic noth region.  LOWER EXTREMITY ROM:  WFL.  LOWER EXTREMITY MMT:   LE strength generally graded at 4+/5 bilaterally.  LUMBAR SPECIAL TESTS:  Increase pain with a left SLR when compared contralaterally.  GAIT: Slow and cautious gait pattern.  (-) Romberg test.  TODAY'S  TREATMENT:                                                                                                                              DATE:                                     4/29 EXERCISE LOG  Exercise Repetitions and Resistance Comments  Nustep  L4 x 15 minutes   Seated HS stretch  3 minutes Low load prolonged stretch   Seated hip ADD isometric  2.5 minutes w/ 5 second hold   Seated glute sets  2.5 minutes w/ 5 second hold        Blank cell = exercise not performed today  Modalities: no redness or adverse reaction to today's modalities  Date:  Unattended Estim: Hip, IFC @ 80-150 Hz w/ 40% scan, 10 mins, Pain Hot Pack: Hip, 10 mins, Pain                                    02/27/23 EXERCISE LOG  Exercise Repetitions and Resistance Comments  Nustep L4 x15 min                    Blank cell = exercise not performed today   Modalities  Date: 02/27/23 Unattended Estim: Hip, IFC, 15 mins, Pain Combo: Hip, 1.5 w/cm2, 100%, 1 mhz, 10 mins, Pain Hot Pack: Hip, 15 mins, Pain  ASSESSMENT:  CLINICAL IMPRESSION: Patient was introduced to multiple new interventions for reduced pain through the use of isometric interventions. She required minimal cueing with today's new interventions for proper exercise performance to promote muscular engagement without aggravating her familiar symptoms. She reported that today's interventions helped as she was feeling better upon the conclusion of treatment. She continues to require skilled physical therapy to address her  remaining impairments to maximize her functional mobility.   OBJECTIVE IMPAIRMENTS: Abnormal gait, decreased activity tolerance, increased muscle spasms, postural dysfunction, and pain.   ACTIVITY LIMITATIONS: carrying, lifting, bending, standing, and locomotion level  PARTICIPATION LIMITATIONS: meal prep, cleaning, and laundry  PERSONAL FACTORS: Time since onset of injury/illness/exacerbation are also affecting patient's functional  outcome.   REHAB POTENTIAL: Good  CLINICAL DECISION MAKING: Stable/uncomplicated  EVALUATION COMPLEXITY: Low  GOALS:  LONG TERM GOALS: Target date: 05/14/23.  Ind with a HEP.  Goal status: On-going  2.  Stand 20 minutes with pain not > 3/10.  Goal status: On-going  3.  Perform ADL's with pain not > 3-4/10.  Goal status: On-going  4.  Eliminate left LE pain.  Goal status: On-going  PLAN:  PT FREQUENCY: 2x/week  PT DURATION: 4 weeks  PLANNED INTERVENTIONS: Therapeutic exercises, Therapeutic activity, Neuromuscular re-education, Patient/Family education, Self Care, Dry Needling, Electrical stimulation, Cryotherapy, Moist heat, Ultrasound, and Manual therapy.  PLAN FOR NEXT SESSION: Dry needling, STW/M, core exercise progression, heat, e' stim.  Granville Lewis, PT 03/04/2023, 1:52 PM

## 2023-03-04 NOTE — Therapy (Deleted)
OUTPATIENT PHYSICAL THERAPY THORACOLUMBAR TREATMENT   Patient Name: Diana Pratt MRN: [id number] DOB:[birth year], 77 y.o., female Today's Date: 02/27/2023  END OF SESSION:  PT End of Session - 02/27/23 1004     Visit Number 5    Number of Visits 8    Date for PT Re-Evaluation 05/14/23    Authorization Type FOTO AT LEAST EVERY 5TH VISIT.  PROGRESS NOTE AT 10TH VISIT.  KX MODIFIER AFTER 15 VISITS.    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 Kevin Fenton   Depression    Diabetes mellitus    Diabetic neuropathy    GERD (gastroesophageal reflux disease)    occ heartburn   Humerus fracture    right   Hyperlipemia    Hypertension    Interstitial cystitis    Osteoporosis    Pneumonia    15 yrs ago   PONV (postoperative nausea and vomiting)    Shortness of breath dyspnea    Past Surgical History:  Procedure Laterality Date   ABDOMINAL HYSTERECTOMY  03/1984   partial   COLONOSCOPY N/A 07/19/2014   Procedure: COLONOSCOPY;  Surgeon: West Bali, MD;  Location: [facility];  Service: Endoscopy;  Laterality: N/A;  9:30   REVERSE SHOULDER ARTHROPLASTY Right 06/14/2016   Procedure: REVERSE SHOULDER ARTHROPLASTY;  Surgeon: Francena Hanly, MD;  Location: [facility];  Service: Orthopedics;  Laterality: Right;   Skin cancer removed  08/07/2016   left ear   Patient Active Problem List   Diagnosis Date Noted   Osteopenia of neck of left femur 10/01/2022   Chronic use of benzodiazepine for therapeutic purpose 10/01/2022   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   Anxiety 07/26/2017   Vaginal atrophy 04/10/2017   Urinary incontinence  04/10/2017   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   COPD (chronic obstructive pulmonary disease)    Asthma    Interstitial cystitis    Unspecified vitamin D deficiency 05/29/2013   Lumbar radiculopathy 05/29/2013   Depression with anxiety 02/22/2011   Diabetes mellitus 02/22/2011   Hyperlipidemia associated with type 2 diabetes mellitus 02/22/2011   Hypertension associated with diabetes 02/22/2011   REFERRING PROVIDER: Delynn Flavin DO  REFERRING DIAG: Chronic left-sided low back pain with left-sided Sciatica  Rationale for Evaluation and Treatment: Rehabilitation  THERAPY DIAG:  Other low back pain  Other muscle spasm  Abnormal posture  ONSET DATE: ~03/2022.  SUBJECTIVE:  SUBJECTIVE STATEMENT: ***  PERTINENT HISTORY:  TSA, OP, HTN.  PAIN:  Are you having pain? Yes: NPRS scale: 5/10 Pain location: Left low back and buttock. Pain description: Shabbing Aggravating factors: Static standing Relieving factors: As above.  PRECAUTIONS: Other: OP.  PATIENT GOALS: Be able to do more with less pain.  OBJECTIVE:   PATIENT SURVEYS:  FOTO 52.  POSTURE: rounded shoulders, forward head, decreased lumbar lordosis, and posterior pelvic tilt  PALPATION: Mild c/o pain in left lower lumbar region but most pain is over left Piriformis/Sciatic noth region.  LOWER EXTREMITY ROM:  WFL.  LOWER EXTREMITY MMT:   LE strength generally graded at 4+/5 bilaterally.  LUMBAR SPECIAL TESTS:  Increase pain with a left SLR when compared contralaterally.  GAIT: Slow and cautious gait pattern.  (-) Romberg test.  TODAY'S TREATMENT:                                                                                                                              DATE:                                      4/29 EXERCISE LOG  Exercise Repetitions and Resistance Comments                       Blank cell = exercise not performed today                                     02/27/23 EXERCISE LOG  Exercise Repetitions and Resistance Comments  Nustep L4 x15 min                    Blank cell = exercise not performed today   Modalities  Date: 02/27/23 Unattended Estim: Hip, IFC, 15 mins, Pain Combo: Hip, 1.5 w/cm2, 100%, 1 mhz, 10 mins, Pain Hot Pack: Hip, 15 mins, Pain  ASSESSMENT:  CLINICAL IMPRESSION: ***  OBJECTIVE IMPAIRMENTS: Abnormal gait, decreased activity tolerance, increased muscle spasms, postural dysfunction, and pain.   ACTIVITY LIMITATIONS: carrying, lifting, bending, standing, and locomotion level  PARTICIPATION LIMITATIONS: meal prep, cleaning, and laundry  PERSONAL FACTORS: Time since onset of injury/illness/exacerbation are also affecting patient's functional outcome.   REHAB POTENTIAL: Good  CLINICAL DECISION MAKING: Stable/uncomplicated  EVALUATION COMPLEXITY: Low  GOALS:  LONG TERM GOALS: Target date: 05/14/23.  Ind with a HEP.  Goal status: On-going  2.  Stand 20 minutes with pain not > 3/10.  Goal status: On-going  3.  Perform ADL's with pain not > 3-4/10.  Goal status: On-going  4.  Eliminate left LE pain.  Goal status: On-going  PLAN:  PT FREQUENCY: 2x/week  PT DURATION: 4 weeks  PLANNED INTERVENTIONS: Therapeutic exercises, Therapeutic activity, Neuromuscular re-education, Patient/Family education, Self Care, Dry Needling,  Electrical stimulation, Cryotherapy, Moist heat, Ultrasound, and Manual therapy.  PLAN FOR NEXT SESSION: Dry needling, STW/M, core exercise progression, heat, e' stim.  Marvell Fuller, PTA 02/27/2023, 10:36 AM

## 2023-03-06 ENCOUNTER — Ambulatory Visit: Payer: Medicare Other | Attending: Family Medicine | Admitting: Physical Therapy

## 2023-03-06 DIAGNOSIS — M5459 Other low back pain: Secondary | ICD-10-CM | POA: Diagnosis not present

## 2023-03-06 DIAGNOSIS — R293 Abnormal posture: Secondary | ICD-10-CM | POA: Insufficient documentation

## 2023-03-06 DIAGNOSIS — M62838 Other muscle spasm: Secondary | ICD-10-CM | POA: Diagnosis not present

## 2023-03-06 NOTE — Therapy (Signed)
OUTPATIENT PHYSICAL THERAPY THORACOLUMBAR TREATMENT   Patient Name: Diana Pratt MRN: [id number] DOB:[birth year], 77 y.o., female Today's Date: 03/06/2023  END OF SESSION:  PT End of Session - 03/06/23 1023     Visit Number 7    Number of Visits 8    Date for PT Re-Evaluation 05/14/23    Authorization Type FOTO AT LEAST EVERY 5TH VISIT.  PROGRESS NOTE AT 10TH VISIT.  KX MODIFIER AFTER 15 VISITS.    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 Kevin Fenton   Depression    Diabetes mellitus    Diabetic neuropathy (HCC)    GERD (gastroesophageal reflux disease)    occ heartburn   Humerus fracture    right   Hyperlipemia    Hypertension    Interstitial cystitis    Osteoporosis    Pneumonia    15 yrs ago   PONV (postoperative nausea and vomiting)    Shortness of breath dyspnea    Past Surgical History:  Procedure Laterality Date   ABDOMINAL HYSTERECTOMY  03/1984   partial   COLONOSCOPY N/A 07/19/2014   Procedure: COLONOSCOPY;  Surgeon: West Bali, MD;  Location: [facility];  Service: Endoscopy;  Laterality: N/A;  9:30   REVERSE SHOULDER ARTHROPLASTY Right 06/14/2016   Procedure: REVERSE SHOULDER ARTHROPLASTY;  Surgeon: Francena Hanly, MD;  Location: [facility];  Service: Orthopedics;  Laterality: Right;   Skin cancer removed  08/07/2016   left ear   Patient Active Problem List   Diagnosis Date Noted   Osteopenia of neck of left femur 10/01/2022   Chronic use of benzodiazepine for therapeutic purpose 10/01/2022   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   Anxiety 07/26/2017   Vaginal atrophy 04/10/2017   Urinary  incontinence 04/10/2017   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   COPD (chronic obstructive pulmonary disease) (HCC)    Asthma    Interstitial cystitis    Unspecified vitamin D deficiency 05/29/2013   Lumbar radiculopathy 05/29/2013   Depression with anxiety 02/22/2011   Diabetes mellitus (HCC) 02/22/2011   Hyperlipidemia associated with type 2 diabetes mellitus (HCC) 02/22/2011   Hypertension associated with diabetes (HCC) 02/22/2011   REFERRING PROVIDER: Delynn Flavin DO  REFERRING DIAG: Chronic left-sided low back pain with left-sided Sciatica  Rationale for Evaluation and Treatment: Rehabilitation  THERAPY DIAG:  Other low back pain  Other muscle spasm  Abnormal posture  ONSET DATE: ~03/2022.  SUBJECTIVE:  SUBJECTIVE STATEMENT: Pain at a 5.  PERTINENT HISTORY:  TSA, OP, HTN.  PAIN:  Are you having pain? Yes: NPRS scale: 5/10 Pain location: Left low back and buttock. Pain description: Shabbing Aggravating factors: Static standing Relieving factors: As above.  PRECAUTIONS: Other: OP.  PATIENT GOALS: Be able to do more with less pain.  OBJECTIVE:   PATIENT SURVEYS:  FOTO 39.48 on 03/04/23  POSTURE: rounded shoulders, forward head, decreased lumbar lordosis, and posterior pelvic tilt  PALPATION: Mild c/o pain in left lower lumbar region but most pain is over left Piriformis/Sciatic noth region.  LOWER EXTREMITY ROM:  WFL.  LOWER EXTREMITY MMT:   LE strength generally graded at 4+/5 bilaterally.  LUMBAR SPECIAL TESTS:  Increase pain with a left SLR when compared contralaterally.  GAIT: Slow and cautious gait pattern.  (-) Romberg test.  TODAY'S TREATMENT:                                                                                                                               DATE:                                     03/06/23 EXERCISE LOG  Exercise Repetitions and Resistance Comments  Nustep  L3 x 15 minutes                   Trigger Point Dry-Needling  Treatment instructions: Expect mild to moderate muscle soreness. S/S of pneumothorax if dry needled over a lung field, and to seek immediate medical attention should they occur. Patient verbalized understanding of these instructions and education.  Patient Consent Given: Yes Education handout provided: Yes Muscles treated: Left piriformis near femoral attachment Treatment response/outcome: Twitch   F/b STW/M x 8 minutes including ischemic release technique to left Piriformis f/b IFC at 80-150 Hz on 40% scan x 20 minutes.       ASSESSMENT:  CLINICAL IMPRESSION: Excellent twitch response to patient's left Piriformis with dry needling.  She felt better after treatment  with normal modality response following removal of modality.    OBJECTIVE IMPAIRMENTS: Abnormal gait, decreased activity tolerance, increased muscle spasms, postural dysfunction, and pain.   ACTIVITY LIMITATIONS: carrying, lifting, bending, standing, and locomotion level  PARTICIPATION LIMITATIONS: meal prep, cleaning, and laundry  PERSONAL FACTORS: Time since onset of injury/illness/exacerbation are also affecting patient's functional outcome.   REHAB POTENTIAL: Good  CLINICAL DECISION MAKING: Stable/uncomplicated  EVALUATION COMPLEXITY: Low  GOALS:  LONG TERM GOALS: Target date: 05/14/23.  Ind with a HEP.  Goal status: On-going  2.  Stand 20 minutes with pain not > 3/10.  Goal status: On-going  3.  Perform ADL's with pain not > 3-4/10.  Goal status: On-going  4.  Eliminate left LE pain.  Goal status: On-going  PLAN:  PT FREQUENCY: 2x/week  PT DURATION: 4 weeks  PLANNED INTERVENTIONS: Therapeutic exercises,  Therapeutic activity, Neuromuscular re-education, Patient/Family education, Self Care,  Dry Needling, Electrical stimulation, Cryotherapy, Moist heat, Ultrasound, and Manual therapy.  PLAN FOR NEXT SESSION: Dry needling, STW/M, core exercise progression, heat, e' stim.  Laquana Villari, [place], PT 03/06/2023, 10:48 AM

## 2023-03-11 ENCOUNTER — Ambulatory Visit: Payer: Medicare Other | Admitting: Physical Therapy

## 2023-03-11 DIAGNOSIS — M5459 Other low back pain: Secondary | ICD-10-CM | POA: Diagnosis not present

## 2023-03-11 DIAGNOSIS — R293 Abnormal posture: Secondary | ICD-10-CM | POA: Diagnosis not present

## 2023-03-11 DIAGNOSIS — M62838 Other muscle spasm: Secondary | ICD-10-CM

## 2023-03-11 NOTE — Therapy (Signed)
OUTPATIENT PHYSICAL THERAPY THORACOLUMBAR TREATMENT   Patient Name: Diana Pratt MRN: [id number] DOB:28-Sep-1946, 77 y.o., female Today's Date: 03/11/2023  END OF SESSION:  PT End of Session - 03/11/23 0937     Visit Number 8    Number of Visits 12    Date for PT Re-Evaluation 05/14/23    Authorization Type FOTO AT LEAST EVERY 5TH VISIT.  PROGRESS NOTE AT 10TH VISIT.  KX MODIFIER AFTER 15 VISITS.    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 Kevin Fenton   Depression    Diabetes mellitus    Diabetic neuropathy (HCC)    GERD (gastroesophageal reflux disease)    occ heartburn   Humerus fracture    right   Hyperlipemia    Hypertension    Interstitial cystitis    Osteoporosis    Pneumonia    15 yrs ago   PONV (postoperative nausea and vomiting)    Shortness of breath dyspnea    Past Surgical History:  Procedure Laterality Date   ABDOMINAL HYSTERECTOMY  03/1984   partial   COLONOSCOPY N/A 07/19/2014   Procedure: COLONOSCOPY;  Surgeon: West Bali, MD;  Location: [facility];  Service: Endoscopy;  Laterality: N/A;  9:30   REVERSE SHOULDER ARTHROPLASTY Right 06/14/2016   Procedure: REVERSE SHOULDER ARTHROPLASTY;  Surgeon: Francena Hanly, MD;  Location: [facility];  Service: Orthopedics;  Laterality: Right;   Skin cancer removed  08/07/2016   left ear   Patient Active Problem List   Diagnosis Date Noted   Osteopenia of neck of left femur 10/01/2022   Chronic use of benzodiazepine for therapeutic purpose 10/01/2022   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   Anxiety 07/26/2017   Vaginal atrophy 04/10/2017   Urinary  incontinence 04/10/2017   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   COPD (chronic obstructive pulmonary disease) (HCC)    Asthma    Interstitial cystitis    Unspecified vitamin D deficiency 05/29/2013   Lumbar radiculopathy 05/29/2013   Depression with anxiety 02/22/2011   Diabetes mellitus (HCC) 02/22/2011   Hyperlipidemia associated with type 2 diabetes mellitus (HCC) 02/22/2011   Hypertension associated with diabetes (HCC) 02/22/2011   REFERRING PROVIDER: Delynn Flavin DO  REFERRING DIAG: Chronic left-sided low back pain with left-sided Sciatica  Rationale for Evaluation and Treatment: Rehabilitation  THERAPY DIAG:  Other low back pain - Plan: PT plan of care cert/re-cert  Other muscle spasm - Plan: PT plan of care cert/re-cert  Abnormal posture - Plan: PT plan of care cert/re-cert  ONSET DATE: ~03/2022.  SUBJECTIVE:  SUBJECTIVE STATEMENT: Pain at a 4.  Felt good after dry needling.  PERTINENT HISTORY:  TSA, OP, HTN.  PAIN:  Are you having pain? Yes: NPRS scale: 4/10 Pain location: Left low back and buttock. Pain description: Shabbing Aggravating factors: Static standing Relieving factors: As above.  PRECAUTIONS: Other: OP.  PATIENT GOALS: Be able to do more with less pain.  OBJECTIVE:   PATIENT SURVEYS:  FOTO 39.48 on 03/04/23  POSTURE: rounded shoulders, forward head, decreased lumbar lordosis, and posterior pelvic tilt  PALPATION: Mild c/o pain in left lower lumbar region but most pain is over left Piriformis/Sciatic noth region.  LOWER EXTREMITY ROM:  WFL.  LOWER EXTREMITY MMT:   LE strength generally graded at 4+/5 bilaterally.  LUMBAR SPECIAL TESTS:  Increase pain with a left SLR when compared contralaterally.  GAIT: Slow and cautious gait pattern.   (-) Romberg test.  TODAY'S TREATMENT:                                                                                                                              DATE:                                     03/06/23 EXERCISE LOG  Exercise Repetitions and Resistance Comments  Nustep  L3 x 16 minutes                   Trigger Point Dry-Needling  Treatment instructions: Expect mild to moderate muscle soreness. S/S of pneumothorax if dry needled over a lung field, and to seek immediate medical attention should they occur. Patient verbalized understanding of these instructions and education.  Patient Consent Given: Yes Education handout provided: Yes Muscles treated: Left piriformis near femoral attachment   F/b STW/M x 8 minutes including ischemic release technique to left Piriformis f/b IFC at 80-150 Hz on 40% scan x 15 minutes.       ASSESSMENT:  CLINICAL IMPRESSION: Patient with lowered pain upon presentation to the clinic which she attributes to dry needling.  Less palpable discomfort over left Piriformis today.  OBJECTIVE IMPAIRMENTS: Abnormal gait, decreased activity tolerance, increased muscle spasms, postural dysfunction, and pain.   ACTIVITY LIMITATIONS: carrying, lifting, bending, standing, and locomotion level  PARTICIPATION LIMITATIONS: meal prep, cleaning, and laundry  PERSONAL FACTORS: Time since onset of injury/illness/exacerbation are also affecting patient's functional outcome.   REHAB POTENTIAL: Good  CLINICAL DECISION MAKING: Stable/uncomplicated  EVALUATION COMPLEXITY: Low  GOALS:  LONG TERM GOALS: Target date: 05/14/23.  Ind with a HEP.  Goal status: On-going  2.  Stand 20 minutes with pain not > 3/10.  Goal status: On-going  3.  Perform ADL's with pain not > 3-4/10.  Goal status: On-going  4.  Eliminate left LE pain.  Goal status: On-going  PLAN:  PT FREQUENCY: 2x/week  PT DURATION: 4 weeks  PLANNED INTERVENTIONS: Therapeutic exercises,  Therapeutic activity, Neuromuscular re-education, Patient/Family education, Self Care, Dry Needling, Electrical stimulation, Cryotherapy, Moist heat, Ultrasound, and Manual therapy.  PLAN FOR NEXT SESSION: Dry needling, STW/M, core exercise progression, heat, e' stim.  Jakarius Flamenco, [place], PT 03/11/2023, 10:18 AM

## 2023-03-13 ENCOUNTER — Ambulatory Visit: Payer: Medicare Other | Admitting: Physical Therapy

## 2023-03-13 DIAGNOSIS — M5459 Other low back pain: Secondary | ICD-10-CM | POA: Diagnosis not present

## 2023-03-13 DIAGNOSIS — M62838 Other muscle spasm: Secondary | ICD-10-CM | POA: Diagnosis not present

## 2023-03-13 DIAGNOSIS — R293 Abnormal posture: Secondary | ICD-10-CM | POA: Diagnosis not present

## 2023-03-13 NOTE — Therapy (Signed)
OUTPATIENT PHYSICAL THERAPY THORACOLUMBAR TREATMENT   Patient Name: Diana Pratt MRN: [id number] DOB:1946/06/27, 77 y.o., female Today's Date: 03/13/2023  END OF SESSION:  PT End of Session - 03/13/23 0910     Visit Number 9    Number of Visits 12    Date for PT Re-Evaluation 05/14/23    Authorization Type FOTO AT LEAST EVERY 5TH VISIT.  PROGRESS NOTE AT 10TH VISIT.  KX MODIFIER AFTER 15 VISITS.    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 Kevin Fenton   Depression    Diabetes mellitus    Diabetic neuropathy (HCC)    GERD (gastroesophageal reflux disease)    occ heartburn   Humerus fracture    right   Hyperlipemia    Hypertension    Interstitial cystitis    Osteoporosis    Pneumonia    15 yrs ago   PONV (postoperative nausea and vomiting)    Shortness of breath dyspnea    Past Surgical History:  Procedure Laterality Date   ABDOMINAL HYSTERECTOMY  03/1984   partial   COLONOSCOPY N/A 07/19/2014   Procedure: COLONOSCOPY;  Surgeon: West Bali, MD;  [facility]: [facility];  Service: Endoscopy;  Laterality: N/A;  9:30   REVERSE SHOULDER ARTHROPLASTY Right 06/14/2016   Procedure: REVERSE SHOULDER ARTHROPLASTY;  Surgeon: Francena Hanly, MD;  Location: [facility] OR;  Service: Orthopedics;  Laterality: Right;   Skin cancer removed  08/07/2016   left ear   Patient Active Problem List   Diagnosis Date Noted   Osteopenia of neck of left femur 10/01/2022   Chronic use of benzodiazepine for therapeutic purpose 10/01/2022   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   Anxiety 07/26/2017   Vaginal atrophy 04/10/2017   Urinary  incontinence 04/10/2017   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   COPD (chronic obstructive pulmonary disease) (HCC)    Asthma    Interstitial cystitis    Unspecified vitamin D deficiency 05/29/2013   Lumbar radiculopathy 05/29/2013   Depression with anxiety 02/22/2011   Diabetes mellitus (HCC) 02/22/2011   Hyperlipidemia associated with type 2 diabetes mellitus (HCC) 02/22/2011   Hypertension associated with diabetes (HCC) 02/22/2011   REFERRING PROVIDER: Delynn Flavin DO  REFERRING DIAG: Chronic left-sided low back pain with left-sided Sciatica  Rationale for Evaluation and Treatment: Rehabilitation  THERAPY DIAG:  Other low back pain  Other muscle spasm  ONSET DATE: ~03/2022.  SUBJECTIVE:  SUBJECTIVE STATEMENT: Diana Pratt staying at a 4. PERTINENT HISTORY:  TSA, OP, HTN.  PAIN:  Are you having pain? Yes: NPRS scale: 4/10 Pain location: Left low back and buttock. Pain description: Shabbing Aggravating factors: Static standing Relieving factors: As above.  PRECAUTIONS: Other: OP.  PATIENT GOALS: Be able to do more with less pain.  OBJECTIVE:   PATIENT SURVEYS:  FOTO 39.48 on 03/04/23  POSTURE: rounded shoulders, forward head, decreased lumbar lordosis, and posterior pelvic tilt  PALPATION: Mild c/o pain in left lower lumbar region but most pain is over left Piriformis/Sciatic noth region.  LOWER EXTREMITY ROM:  WFL.  LOWER EXTREMITY MMT:   LE strength generally graded at 4+/5 bilaterally.  LUMBAR SPECIAL TESTS:  Increase pain with a left SLR when compared contralaterally.  GAIT: Slow and cautious gait pattern.  (-) Romberg test.  TODAY'S TREATMENT:                                                                                                                               DATE:                                     03/13/23 EXERCISE LOG  Exercise Repetitions and Resistance Comments  Nustep  L3 x 15 minutes                    F/b STW/M x 8 minutes including ischemic release technique to left Piriformis f/b IFC at 80-150 Hz on 40% scan x 20 minutes.  Patient tolerated treatment without complaint with normal modality response following removal of modality.        ASSESSMENT:  CLINICAL IMPRESSION: Pain consistently lowered since last visit.  Good response to treatment with less pain following.  OBJECTIVE IMPAIRMENTS: Abnormal gait, decreased activity tolerance, increased muscle spasms, postural dysfunction, and pain.   ACTIVITY LIMITATIONS: carrying, lifting, bending, standing, and locomotion level  PARTICIPATION LIMITATIONS: meal prep, cleaning, and laundry  PERSONAL FACTORS: Time since onset of injury/illness/exacerbation are also affecting patient's functional outcome.   REHAB POTENTIAL: Good  CLINICAL DECISION MAKING: Stable/uncomplicated  EVALUATION COMPLEXITY: Low  GOALS:  LONG TERM GOALS: Target date: 05/14/23.  Ind with a HEP.  Goal status: On-going  2.  Stand 20 minutes with pain not > 3/10.  Goal status: On-going  3.  Perform ADL's with pain not > 3-4/10.  Goal status: On-going  4.  Eliminate left LE pain.  Goal status: On-going  PLAN:  PT FREQUENCY: 2x/week  PT DURATION: 4 weeks  PLANNED INTERVENTIONS: Therapeutic exercises, Therapeutic activity, Neuromuscular re-education, Patient/Family education, Self Care, Dry Needling, Electrical stimulation, Cryotherapy, Moist heat, Ultrasound, and Manual therapy.  PLAN FOR NEXT SESSION: Dry needling, STW/M, core exercise progression, heat, e' stim.  Herta Hink, [place], PT 03/13/2023, 9:51 AM

## 2023-03-18 ENCOUNTER — Ambulatory Visit: Payer: Medicare Other | Admitting: Physical Therapy

## 2023-03-20 ENCOUNTER — Ambulatory Visit: Payer: Medicare Other | Admitting: Physical Therapy

## 2023-03-20 DIAGNOSIS — M62838 Other muscle spasm: Secondary | ICD-10-CM | POA: Diagnosis not present

## 2023-03-20 DIAGNOSIS — R293 Abnormal posture: Secondary | ICD-10-CM | POA: Diagnosis not present

## 2023-03-20 DIAGNOSIS — M5459 Other low back pain: Secondary | ICD-10-CM | POA: Diagnosis not present

## 2023-03-20 NOTE — Therapy (Addendum)
OUTPATIENT PHYSICAL THERAPY THORACOLUMBAR TREATMENT   Patient Name: Diana Pratt MRN: [id number] DOB:[birth year], 77 y.o., female Today's Date: 03/20/2023  END OF SESSION:  PT End of Session - 03/20/23 1007     Visit Number 10    Number of Visits 12    Date for PT Re-Evaluation 05/14/23    Authorization Type FOTO AT LEAST EVERY 5TH VISIT.  PROGRESS NOTE AT 10TH VISIT.  KX MODIFIER AFTER 15 VISITS.    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 Kevin Fenton   Depression    Diabetes mellitus    Diabetic neuropathy (HCC)    GERD (gastroesophageal reflux disease)    occ heartburn   Humerus fracture    right   Hyperlipemia    Hypertension    Interstitial cystitis    Osteoporosis    Pneumonia    15 yrs ago   PONV (postoperative nausea and vomiting)    Shortness of breath dyspnea    Past Surgical History:  Procedure Laterality Date   ABDOMINAL HYSTERECTOMY  03/1984   partial   COLONOSCOPY N/A 07/19/2014   Procedure: COLONOSCOPY;  Surgeon: West Bali, MD;  [facility];  Service: Endoscopy;  Laterality: N/A;  9:30   REVERSE SHOULDER ARTHROPLASTY Right 06/14/2016   Procedure: REVERSE SHOULDER ARTHROPLASTY;  Surgeon: Francena Hanly, MD;  Location: [facility];  Service: Orthopedics;  Laterality: Right;   Skin cancer removed  08/07/2016   left ear   Patient Active Problem List   Diagnosis Date Noted   Osteopenia of neck of left femur 10/01/2022   Chronic use of benzodiazepine for therapeutic purpose 10/01/2022   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   Anxiety 07/26/2017   Vaginal atrophy 04/10/2017   Urinary  incontinence 04/10/2017   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   COPD (chronic obstructive pulmonary disease) (HCC)    Asthma    Interstitial cystitis    Unspecified vitamin D deficiency 05/29/2013   Lumbar radiculopathy 05/29/2013   Depression with anxiety 02/22/2011   Diabetes mellitus (HCC) 02/22/2011   Hyperlipidemia associated with type 2 diabetes mellitus (HCC) 02/22/2011   Hypertension associated with diabetes (HCC) 02/22/2011   REFERRING PROVIDER: Delynn Flavin DO  REFERRING DIAG: Chronic left-sided low back pain with left-sided Sciatica  Rationale for Evaluation and Treatment: Rehabilitation  THERAPY DIAG:  Other low back pain  Other muscle spasm  Abnormal posture  ONSET DATE: ~03/2022.  SUBJECTIVE:  SUBJECTIVE STATEMENT:  Pain staying at a 4.  Pain lower down together.  Walking better. PERTINENT HISTORY:  TSA, OP, HTN.  PAIN:  Are you having pain? Yes: NPRS scale: 4/10 Pain location: Left low back and buttock. Pain description: Shabbing Aggravating factors: Static standing Relieving factors: As above.  PRECAUTIONS: Other: OP.  PATIENT GOALS: Be able to do more with less pain.  OBJECTIVE:   PATIENT SURVEYS:  FOTO 39.48 on 03/04/23  POSTURE: rounded shoulders, forward head, decreased lumbar lordosis, and posterior pelvic tilt  PALPATION: Mild c/o pain in left lower lumbar region but most pain is over left Piriformis/Sciatic noth region.  LOWER EXTREMITY ROM:  WFL.  LOWER EXTREMITY MMT:   LE strength generally graded at 4+/5 bilaterally.  LUMBAR SPECIAL TESTS:  Increase pain with a left SLR when compared contralaterally.  GAIT: Slow and cautious gait pattern.  (-) Romberg test.  TODAY'S TREATMENT:                                                                                                                               DATE:                                     03/20/23 EXERCISE LOG  Exercise Repetitions and Resistance Comments  Nustep  L3 x 16 minutes                    F/b STW/M x 7 minutes including ischemic release technique to left lower gluteal/left ischial tuberosity region f/b IFC at 80-150 Hz on 40% scan x 20 minutes.  Patient tolerated treatment without complaint with normal modality response following removal of modality.        ASSESSMENT:  CLINICAL IMPRESSION: See below.  OBJECTIVE IMPAIRMENTS: Abnormal gait, decreased activity tolerance, increased muscle spasms, postural dysfunction, and pain.   ACTIVITY LIMITATIONS: carrying, lifting, bending, standing, and locomotion level  PARTICIPATION LIMITATIONS: meal prep, cleaning, and laundry  PERSONAL FACTORS: Time since onset of injury/illness/exacerbation are also affecting patient's functional outcome.   REHAB POTENTIAL: Good  CLINICAL DECISION MAKING: Stable/uncomplicated  EVALUATION COMPLEXITY: Low  GOALS:  LONG TERM GOALS: Target date: 05/14/23.  Ind with a HEP.  Goal status: On-going  2.  Stand 20 minutes with pain not > 3/10.  Goal status: On-going  3.  Perform ADL's with pain not > 3-4/10.  Goal status: On-going  4.  Eliminate left LE pain.  Goal status: On-going  PLAN:  PT FREQUENCY: 2x/week  PT DURATION: 4 weeks  PLANNED INTERVENTIONS: Therapeutic exercises, Therapeutic activity, Neuromuscular re-education, Patient/Family education, Self Care, Dry Needling, Electrical stimulation, Cryotherapy, Moist heat, Ultrasound, and Manual therapy.  PLAN FOR NEXT SESSION: Dry needling, STW/M, core exercise progression, heat, e' stim.  Progress Note Reporting Period 02/13/23 to 03/20/23.  See note below for Objective Data and Assessment of Progress/Goals. FOTO score improved to  52.  Her pain has been lower and she is walking better.  She is  pleased with progress.     [name], [place], PT 03/20/2023, 10:17 AM

## 2023-03-25 ENCOUNTER — Ambulatory Visit: Payer: Medicare Other | Admitting: Physical Therapy

## 2023-03-25 DIAGNOSIS — M62838 Other muscle spasm: Secondary | ICD-10-CM

## 2023-03-25 DIAGNOSIS — M5459 Other low back pain: Secondary | ICD-10-CM | POA: Diagnosis not present

## 2023-03-25 DIAGNOSIS — R293 Abnormal posture: Secondary | ICD-10-CM | POA: Diagnosis not present

## 2023-03-25 NOTE — Therapy (Signed)
OUTPATIENT PHYSICAL THERAPY THORACOLUMBAR TREATMENT   Patient Name: Diana Pratt MRN: [id number] DOB:[birth year], 77 y.o., female Today's Date: 03/25/2023  END OF SESSION:  PT End of Session - 03/25/23 0903     Visit Number 11    Number of Visits 12    Date for PT Re-Evaluation 05/14/23    Authorization Type FOTO AT LEAST EVERY 5TH VISIT.  PROGRESS NOTE AT 10TH VISIT.  KX MODIFIER AFTER 15 VISITS.    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 Kevin Fenton   Depression    Diabetes mellitus    Diabetic neuropathy (HCC)    GERD (gastroesophageal reflux disease)    occ heartburn   Humerus fracture    right   Hyperlipemia    Hypertension    Interstitial cystitis    Osteoporosis    Pneumonia    15 yrs ago   PONV (postoperative nausea and vomiting)    Shortness of breath dyspnea    Past Surgical History:  Procedure Laterality Date   ABDOMINAL HYSTERECTOMY  03/1984   partial   COLONOSCOPY N/A 07/19/2014   Procedure: COLONOSCOPY;  Surgeon: West Bali, MD;  [facility];  Service: Endoscopy;  Laterality: N/A;  9:30   REVERSE SHOULDER ARTHROPLASTY Right 06/14/2016   Procedure: REVERSE SHOULDER ARTHROPLASTY;  Surgeon: Francena Hanly, MD;  Location: [facility];  Service: Orthopedics;  Laterality: Right;   Skin cancer removed  08/07/2016   left ear   Patient Active Problem List   Diagnosis Date Noted   Osteopenia of neck of left femur 10/01/2022   Chronic use of benzodiazepine for therapeutic purpose 10/01/2022   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   Anxiety 07/26/2017   Vaginal atrophy 04/10/2017   Urinary  incontinence 04/10/2017   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   COPD (chronic obstructive pulmonary disease) (HCC)    Asthma    Interstitial cystitis    Unspecified vitamin D deficiency 05/29/2013   Lumbar radiculopathy 05/29/2013   Depression with anxiety 02/22/2011   Diabetes mellitus (HCC) 02/22/2011   Hyperlipidemia associated with type 2 diabetes mellitus (HCC) 02/22/2011   Hypertension associated with diabetes (HCC) 02/22/2011   REFERRING PROVIDER: Delynn Flavin DO  REFERRING DIAG: Chronic left-sided low back pain with left-sided Sciatica  Rationale for Evaluation and Treatment: Rehabilitation  THERAPY DIAG:  Other low back pain  Other muscle spasm  ONSET DATE: ~03/2022.  SUBJECTIVE:  SUBJECTIVE STATEMENT:  Pain at a 3 today. PERTINENT HISTORY:  TSA, OP, HTN.  PAIN:  Are you having pain? Yes: NPRS scale: 4/10 Pain location: Left low back and buttock. Pain description: Shabbing Aggravating factors: Static standing Relieving factors: As above.  PRECAUTIONS: Other: OP.  PATIENT GOALS: Be able to do more with less pain.  OBJECTIVE:   PATIENT SURVEYS:  FOTO 39.48 on 03/04/23  POSTURE: rounded shoulders, forward head, decreased lumbar lordosis, and posterior pelvic tilt  PALPATION: Mild c/o pain in left lower lumbar region but most pain is over left Piriformis/Sciatic noth region.  LOWER EXTREMITY ROM:  WFL.  LOWER EXTREMITY MMT:   LE strength generally graded at 4+/5 bilaterally.  LUMBAR SPECIAL TESTS:  Increase pain with a left SLR when compared contralaterally.  GAIT: Slow and cautious gait pattern.  (-) Romberg test.  TODAY'S TREATMENT:                                                                                                                               DATE:                                     03/25/23 EXERCISE LOG  Exercise Repetitions and Resistance Comments  Nustep  L3 x 15 minutes                    F/b STW/M x 8 minutes including ischemic release technique to left lower gluteal/left ischial tuberosity region f/b IFC at 80-150 Hz on 40% scan x 20 minutes.  Patient tolerated treatment without complaint with normal modality response following removal of modality.        ASSESSMENT:  CLINICAL IMPRESSION: Patient doing well with a lowered pain-level.  Her pain over the last couple of sessions has been more localized to her left ischial tuberosity/hamstring attachment region.    OBJECTIVE IMPAIRMENTS: Abnormal gait, decreased activity tolerance, increased muscle spasms, postural dysfunction, and pain.   ACTIVITY LIMITATIONS: carrying, lifting, bending, standing, and locomotion level  PARTICIPATION LIMITATIONS: meal prep, cleaning, and laundry  PERSONAL FACTORS: Time since onset of injury/illness/exacerbation are also affecting patient's functional outcome.   REHAB POTENTIAL: Good  CLINICAL DECISION MAKING: Stable/uncomplicated  EVALUATION COMPLEXITY: Low  GOALS:  LONG TERM GOALS: Target date: 05/14/23.  Ind with a HEP.  Goal status: On-going  2.  Stand 20 minutes with pain not > 3/10.  Goal status: On-going  3.  Perform ADL's with pain not > 3-4/10.  Goal status: On-going  4.  Eliminate left LE pain.  Goal status: On-going  PLAN:  PT FREQUENCY: 2x/week  PT DURATION: 4 weeks  PLANNED INTERVENTIONS: Therapeutic exercises, Therapeutic activity, Neuromuscular re-education, Patient/Family education, Self Care, Dry Needling, Electrical stimulation, Cryotherapy, Moist heat, Ultrasound, and Manual therapy.  PLAN FOR NEXT SESSION: Dry needling, STW/M, core exercise progression, heat, e' stim.  Kourtlynn Trevor, [place], PT 03/25/2023, 10:04 AM

## 2023-04-02 ENCOUNTER — Ambulatory Visit: Payer: Medicare Other | Admitting: Physical Therapy

## 2023-04-02 DIAGNOSIS — R293 Abnormal posture: Secondary | ICD-10-CM | POA: Diagnosis not present

## 2023-04-02 DIAGNOSIS — M62838 Other muscle spasm: Secondary | ICD-10-CM | POA: Diagnosis not present

## 2023-04-02 DIAGNOSIS — M5459 Other low back pain: Secondary | ICD-10-CM | POA: Diagnosis not present

## 2023-04-02 NOTE — Therapy (Signed)
OUTPATIENT PHYSICAL THERAPY THORACOLUMBAR TREATMENT   Patient Name: Diana Pratt MRN: [id number] DOB:1946/01/09, 77 y.o., female Today's Date: 04/02/2023  END OF SESSION:  PT End of Session - 04/02/23 0938     Visit Number 12    Number of Visits 16    Date for PT Re-Evaluation 05/14/23    Authorization Type FOTO AT LEAST EVERY 5TH VISIT.  PROGRESS NOTE AT 10TH VISIT.  KX MODIFIER AFTER 15 VISITS.    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 Kevin Fenton   Depression    Diabetes mellitus    Diabetic neuropathy (HCC)    GERD (gastroesophageal reflux disease)    occ heartburn   Humerus fracture    right   Hyperlipemia    Hypertension    Interstitial cystitis    Osteoporosis    Pneumonia    15 yrs ago   PONV (postoperative nausea and vomiting)    Shortness of breath dyspnea    Past Surgical History:  Procedure Laterality Date   ABDOMINAL HYSTERECTOMY  03/1984   partial   COLONOSCOPY N/A 07/19/2014   Procedure: COLONOSCOPY;  Surgeon: West Bali, MD;  [facility];  Service: Endoscopy;  Laterality: N/A;  9:30   REVERSE SHOULDER ARTHROPLASTY Right 06/14/2016   Procedure: REVERSE SHOULDER ARTHROPLASTY;  Surgeon: Francena Hanly, MD;  Location: [facility];  Service: Orthopedics;  Laterality: Right;   Skin cancer removed  08/07/2016   left ear   Patient Active Problem List   Diagnosis Date Noted   Osteopenia of neck of left femur 10/01/2022   Chronic use of benzodiazepine for therapeutic purpose 10/01/2022   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   Anxiety 07/26/2017   Vaginal atrophy 04/10/2017   Urinary incontinence 04/10/2017   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   COPD (chronic obstructive pulmonary disease) (HCC)    Asthma    Interstitial cystitis    Unspecified vitamin D deficiency 05/29/2013   Lumbar radiculopathy 05/29/2013   Depression with anxiety 02/22/2011   Diabetes mellitus (HCC) 02/22/2011   Hyperlipidemia associated with type 2 diabetes mellitus (HCC) 02/22/2011   Hypertension associated with diabetes (HCC) 02/22/2011   REFERRING PROVIDER: Delynn Flavin DO  REFERRING DIAG: Chronic left-sided low back pain with left-sided Sciatica  Rationale for Evaluation and Treatment: Rehabilitation  THERAPY DIAG:  Other low back pain - Plan: PT plan of care cert/re-cert  Other muscle spasm - Plan: PT plan of care cert/re-cert  Abnormal posture - Plan: PT plan of care cert/re-cert  ONSET DATE: ~03/2022.  SUBJECTIVE:  SUBJECTIVE STATEMENT: Pain at a 3 today. PERTINENT HISTORY:  TSA, OP, HTN.  PAIN:  Are you having pain? Yes: NPRS scale: 3/10 Pain location: Left low back and buttock. Pain description: Shabbing Aggravating factors: Static standing Relieving factors: As above.  PRECAUTIONS: Other: OP.  PATIENT GOALS: Be able to do more with less pain.  OBJECTIVE:   PATIENT SURVEYS:  FOTO 39.48 on 03/04/23  POSTURE: rounded shoulders, forward head, decreased lumbar lordosis, and posterior pelvic tilt  PALPATION: Mild c/o pain in left lower lumbar region but most pain is over left Piriformis/Sciatic noth region.  LOWER EXTREMITY ROM:  WFL.  LOWER EXTREMITY MMT:   LE strength generally graded at 4+/5 bilaterally.  LUMBAR SPECIAL TESTS:  Increase pain with a left SLR when compared contralaterally.  GAIT: Slow and cautious gait pattern.  (-) Romberg test.  TODAY'S TREATMENT:                                                                                                                               DATE:                                     04/02/23 EXERCISE LOG  Exercise Repetitions and Resistance Comments  Nustep  L3 x 16 minutes                    F/b STW/M x 7 minutes including ischemic release technique to left lower gluteal/left ischial tuberosity region f/b IFC at 80-150 Hz on 40% scan x 20 minutes.  Patient tolerated treatment without complaint with normal modality response following removal of modality.        ASSESSMENT:  CLINICAL IMPRESSION: Pain remains at a lowered level and localized to left ischial tuberosity region.  She is pleased with her progress.  OBJECTIVE IMPAIRMENTS: Abnormal gait, decreased activity tolerance, increased muscle spasms, postural dysfunction, and pain.   ACTIVITY LIMITATIONS: carrying, lifting, bending, standing, and locomotion level  PARTICIPATION LIMITATIONS: meal prep, cleaning, and laundry  PERSONAL FACTORS: Time since onset of injury/illness/exacerbation are also affecting patient's functional outcome.   REHAB POTENTIAL: Good  CLINICAL DECISION MAKING: Stable/uncomplicated  EVALUATION COMPLEXITY: Low  GOALS:  LONG TERM GOALS: Target date: 05/14/23.  Ind with a HEP.  Goal status: On-going  2.  Stand 20 minutes with pain not > 3/10.  Goal status: On-going  3.  Perform ADL's with pain not > 3-4/10.  Goal status: On-going  4.  Eliminate left LE pain.  Goal status: On-going  PLAN:  PT FREQUENCY: 2x/week  PT DURATION: 4 weeks  PLANNED INTERVENTIONS: Therapeutic exercises, Therapeutic activity, Neuromuscular re-education, Patient/Family education, Self Care, Dry Needling, Electrical stimulation, Cryotherapy, Moist heat, Ultrasound, and Manual therapy.  PLAN FOR NEXT SESSION: Dry needling, STW/M, core exercise progression, heat, e' stim.       Kahlee Metivier, [place], PT 04/02/2023, 9:59 AM

## 2023-04-08 ENCOUNTER — Ambulatory Visit: Payer: Medicare Other | Attending: Family Medicine | Admitting: Physical Therapy

## 2023-04-08 DIAGNOSIS — R293 Abnormal posture: Secondary | ICD-10-CM | POA: Insufficient documentation

## 2023-04-08 DIAGNOSIS — M62838 Other muscle spasm: Secondary | ICD-10-CM | POA: Diagnosis not present

## 2023-04-08 DIAGNOSIS — M5459 Other low back pain: Secondary | ICD-10-CM | POA: Insufficient documentation

## 2023-04-08 NOTE — Therapy (Signed)
OUTPATIENT PHYSICAL THERAPY THORACOLUMBAR TREATMENT   Patient Name: Diana Pratt MRN: [id number] DOB:[birth year], 77 y.o., female Today's Date: 04/08/2023  END OF SESSION:  PT End of Session - 04/08/23 0917     Visit Number 13    Number of Visits 16    Date for PT Re-Evaluation 05/14/23    Authorization Type FOTO AT LEAST EVERY 5TH VISIT.  PROGRESS NOTE AT 10TH VISIT.  KX MODIFIER AFTER 15 VISITS.    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 Kevin Fenton   Depression    Diabetes mellitus    Diabetic neuropathy (HCC)    GERD (gastroesophageal reflux disease)    occ heartburn   Humerus fracture    right   Hyperlipemia    Hypertension    Interstitial cystitis    Osteoporosis    Pneumonia    15 yrs ago   PONV (postoperative nausea and vomiting)    Shortness of breath dyspnea    Past Surgical History:  Procedure Laterality Date   ABDOMINAL HYSTERECTOMY  03/1984   partial   COLONOSCOPY N/A 07/19/2014   Procedure: COLONOSCOPY;  Surgeon: West Bali, MD;  [facility];  Service: Endoscopy;  Laterality: N/A;  9:30   REVERSE SHOULDER ARTHROPLASTY Right 06/14/2016   Procedure: REVERSE SHOULDER ARTHROPLASTY;  Surgeon: Francena Hanly, MD;  Location: [facility];  Service: Orthopedics;  Laterality: Right;   Skin cancer removed  08/07/2016   left ear   Patient Active Problem List   Diagnosis Date Noted   Osteopenia of neck of left femur 10/01/2022   Chronic use of benzodiazepine for therapeutic purpose 10/01/2022   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   Anxiety 07/26/2017   Vaginal atrophy 04/10/2017   Urinary  incontinence 04/10/2017   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   COPD (chronic obstructive pulmonary disease) (HCC)    Asthma    Interstitial cystitis    Unspecified vitamin D deficiency 05/29/2013   Lumbar radiculopathy 05/29/2013   Depression with anxiety 02/22/2011   Diabetes mellitus (HCC) 02/22/2011   Hyperlipidemia associated with type 2 diabetes mellitus (HCC) 02/22/2011   Hypertension associated with diabetes (HCC) 02/22/2011   REFERRING PROVIDER: Delynn Flavin DO  REFERRING DIAG: Chronic left-sided low back pain with left-sided Sciatica  Rationale for Evaluation and Treatment: Rehabilitation  THERAPY DIAG:  Other low back pain  Other muscle spasm  Abnormal posture  ONSET DATE: ~03/2022.  SUBJECTIVE:  SUBJECTIVE STATEMENT: Pain still at a 3 . PERTINENT HISTORY:  TSA, OP, HTN.  PAIN:  Are you having pain? Yes: NPRS scale: 3/10 Pain location: Left low back and buttock. Pain description: Shabbing Aggravating factors: Static standing Relieving factors: As above.  PRECAUTIONS: Other: OP.  PATIENT GOALS: Be able to do more with less pain.  OBJECTIVE:   PATIENT SURVEYS:  FOTO 39.48 on 03/04/23  POSTURE: rounded shoulders, forward head, decreased lumbar lordosis, and posterior pelvic tilt  PALPATION: Mild c/o pain in left lower lumbar region but most pain is over left Piriformis/Sciatic noth region.  LOWER EXTREMITY ROM:  WFL.  LOWER EXTREMITY MMT:   LE strength generally graded at 4+/5 bilaterally.  LUMBAR SPECIAL TESTS:  Increase pain with a left SLR when compared contralaterally.  GAIT: Slow and cautious gait pattern.  (-) Romberg test.  TODAY'S TREATMENT:                                                                                                                               DATE:                                     04/02/23 EXERCISE LOG  Exercise Repetitions and Resistance Comments  Nustep  L3 x 15 minutes                    F/b STW/M x 8 minutes including ischemic release technique to left lower gluteal/left ischial tuberosity region f/b IFC at 80-150 Hz on 40% scan x 15 minutes.  Patient tolerated treatment without complaint with normal modality response following removal of modality.        ASSESSMENT:  CLINICAL IMPRESSION: Pain very localized to left ischial tuberosity region now.  She is pleased with the lowered pain-level and feels much better since starting PT.  OBJECTIVE IMPAIRMENTS: Abnormal gait, decreased activity tolerance, increased muscle spasms, postural dysfunction, and pain.   ACTIVITY LIMITATIONS: carrying, lifting, bending, standing, and locomotion level  PARTICIPATION LIMITATIONS: meal prep, cleaning, and laundry  PERSONAL FACTORS: Time since onset of injury/illness/exacerbation are also affecting patient's functional outcome.   REHAB POTENTIAL: Good  CLINICAL DECISION MAKING: Stable/uncomplicated  EVALUATION COMPLEXITY: Low  GOALS:  LONG TERM GOALS: Target date: 05/14/23.  Ind with a HEP.  Goal status: On-going  2.  Stand 20 minutes with pain not > 3/10.  Goal status: On-going  3.  Perform ADL's with pain not > 3-4/10.  Goal status: On-going  4.  Eliminate left LE pain.  Goal status: On-going  PLAN:  PT FREQUENCY: 2x/week  PT DURATION: 4 weeks  PLANNED INTERVENTIONS: Therapeutic exercises, Therapeutic activity, Neuromuscular re-education, Patient/Family education, Self Care, Dry Needling, Electrical stimulation, Cryotherapy, Moist heat, Ultrasound, and Manual therapy.  PLAN FOR NEXT SESSION: Dry needling, STW/M, core exercise progression, heat, e' stim.       Kimyatta Lecy, [place], PT  04/08/2023, 9:37 AM

## 2023-04-15 ENCOUNTER — Ambulatory Visit: Payer: Medicare Other | Admitting: Physical Therapy

## 2023-04-15 DIAGNOSIS — R293 Abnormal posture: Secondary | ICD-10-CM | POA: Diagnosis not present

## 2023-04-15 DIAGNOSIS — M5459 Other low back pain: Secondary | ICD-10-CM | POA: Diagnosis not present

## 2023-04-15 DIAGNOSIS — M62838 Other muscle spasm: Secondary | ICD-10-CM | POA: Diagnosis not present

## 2023-04-15 NOTE — Therapy (Signed)
OUTPATIENT PHYSICAL THERAPY THORACOLUMBAR TREATMENT   Patient Name: Diana Pratt MRN: [id number] DOB:1946-02-12, 77 y.o., female Today's Date: 04/15/2023  END OF SESSION:  PT End of Session - 04/15/23 0919     Visit Number 14    Number of Visits 16    Date for PT Re-Evaluation 05/14/23    Authorization Type FOTO AT LEAST EVERY 5TH VISIT.  PROGRESS NOTE AT 10TH VISIT.  KX MODIFIER AFTER 15 VISITS.    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 Kevin Fenton   Depression    Diabetes mellitus    Diabetic neuropathy (HCC)    GERD (gastroesophageal reflux disease)    occ heartburn   Humerus fracture    right   Hyperlipemia    Hypertension    Interstitial cystitis    Osteoporosis    Pneumonia    15 yrs ago   PONV (postoperative nausea and vomiting)    Shortness of breath dyspnea    Past Surgical History:  Procedure Laterality Date   ABDOMINAL HYSTERECTOMY  03/1984   partial   COLONOSCOPY N/A 07/19/2014   Procedure: COLONOSCOPY;  Surgeon: West Bali, MD;  [facility];  Service: Endoscopy;  Laterality: N/A;  9:30   REVERSE SHOULDER ARTHROPLASTY Right 06/14/2016   Procedure: REVERSE SHOULDER ARTHROPLASTY;  Surgeon: Francena Hanly, MD;  [facility]: [facility];  Service: Orthopedics;  Laterality: Right;   Skin cancer removed  08/07/2016   left ear   Patient Active Problem List   Diagnosis Date Noted   Osteopenia of neck of left femur 10/01/2022   Chronic use of benzodiazepine for therapeutic purpose 10/01/2022   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   Anxiety 07/26/2017   Vaginal atrophy 04/10/2017   Urinary  incontinence 04/10/2017   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   COPD (chronic obstructive pulmonary disease) (HCC)    Asthma    Interstitial cystitis    Unspecified vitamin D deficiency 05/29/2013   Lumbar radiculopathy 05/29/2013   Depression with anxiety 02/22/2011   Diabetes mellitus (HCC) 02/22/2011   Hyperlipidemia associated with type 2 diabetes mellitus (HCC) 02/22/2011   Hypertension associated with diabetes (HCC) 02/22/2011   REFERRING PROVIDER: Delynn Flavin DO  REFERRING DIAG: Chronic left-sided low back pain with left-sided Sciatica  Rationale for Evaluation and Treatment: Rehabilitation  THERAPY DIAG:  Other low back pain  Other muscle spasm  ONSET DATE: ~03/2022.  SUBJECTIVE:  SUBJECTIVE STATEMENT: Did very well after last treatment.  PERTINENT HISTORY:  TSA, OP, HTN.  PAIN:  Are you having pain? Yes: NPRS scale: 3/10 Pain location: Left buttock. Pain description: Shabbing Aggravating factors: Static standing Relieving factors: As above.  PRECAUTIONS: Other: OP.  PATIENT GOALS: Be able to do more with less pain.  OBJECTIVE:   PATIENT SURVEYS:  FOTO 39.48 on 03/04/23  POSTURE: rounded shoulders, forward head, decreased lumbar lordosis, and posterior pelvic tilt  PALPATION: Mild c/o pain in left lower lumbar region but most pain is over left Piriformis/Sciatic noth region.  LOWER EXTREMITY ROM:  WFL.  LOWER EXTREMITY MMT:   LE strength generally graded at 4+/5 bilaterally.  LUMBAR SPECIAL TESTS:  Increase pain with a left SLR when compared contralaterally.  GAIT: Slow and cautious gait pattern.  (-) Romberg test.  TODAY'S TREATMENT:                                                                                                                               DATE:                                     04/15/23 EXERCISE LOG  Exercise Repetitions and Resistance Comments  Recumbent bike L3 x 13 minutes                    F/b STW/M x 10 minutes including ischemic release technique to left lower gluteal/left ischial tuberosity region f/b IFC at 80-150 Hz on 40% scan x 20 minutes.  Patient tolerated treatment without complaint with normal modality response following removal of modality.        ASSESSMENT:  CLINICAL IMPRESSION: Patient states she felt very good after last treatment.  Patient's pain has been staying consistently low.  2 visits remaining.  OBJECTIVE IMPAIRMENTS: Abnormal gait, decreased activity tolerance, increased muscle spasms, postural dysfunction, and pain.   ACTIVITY LIMITATIONS: carrying, lifting, bending, standing, and locomotion level  PARTICIPATION LIMITATIONS: meal prep, cleaning, and laundry  PERSONAL FACTORS: Time since onset of injury/illness/exacerbation are also affecting patient's functional outcome.   REHAB POTENTIAL: Good  CLINICAL DECISION MAKING: Stable/uncomplicated  EVALUATION COMPLEXITY: Low  GOALS:  LONG TERM GOALS: Target date: 05/14/23.  Ind with a HEP.  Goal status: On-going  2.  Stand 20 minutes with pain not > 3/10.  Goal status: On-going  3.  Perform ADL's with pain not > 3-4/10.  Goal status: On-going  4.  Eliminate left LE pain.  Goal status: On-going  PLAN:  PT FREQUENCY: 2x/week  PT DURATION: 4 weeks  PLANNED INTERVENTIONS: Therapeutic exercises, Therapeutic activity, Neuromuscular re-education, Patient/Family education, Self Care, Dry Needling, Electrical stimulation, Cryotherapy, Moist heat, Ultrasound, and Manual therapy.  PLAN FOR NEXT SESSION: Dry needling, STW/M, core exercise progression, heat, e' stim.       Yovany Clock, [place], PT 04/15/2023, 9:57 AM

## 2023-04-22 ENCOUNTER — Ambulatory Visit: Payer: Medicare Other | Admitting: Physical Therapy

## 2023-04-22 DIAGNOSIS — M62838 Other muscle spasm: Secondary | ICD-10-CM | POA: Diagnosis not present

## 2023-04-22 DIAGNOSIS — R293 Abnormal posture: Secondary | ICD-10-CM | POA: Diagnosis not present

## 2023-04-22 DIAGNOSIS — M5459 Other low back pain: Secondary | ICD-10-CM

## 2023-04-22 NOTE — Therapy (Addendum)
OUTPATIENT PHYSICAL THERAPY THORACOLUMBAR TREATMENT   Patient Name: Diana Pratt MRN: [id number] DOB:Dec 03, 1945, 77 y.o., female Today's Date: 04/22/2023  END OF SESSION:  PT End of Session - 04/22/23 0927     Visit Number 15    Number of Visits 16    Date for PT Re-Evaluation 05/14/23    Authorization Type FOTO AT LEAST EVERY 5TH VISIT.  PROGRESS NOTE AT 10TH VISIT.  KX MODIFIER AFTER 15 VISITS.    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 Kevin Fenton   Depression    Diabetes mellitus    Diabetic neuropathy (HCC)    GERD (gastroesophageal reflux disease)    occ heartburn   Humerus fracture    right   Hyperlipemia    Hypertension    Interstitial cystitis    Osteoporosis    Pneumonia    15 yrs ago   PONV (postoperative nausea and vomiting)    Shortness of breath dyspnea    Past Surgical History:  Procedure Laterality Date   ABDOMINAL HYSTERECTOMY  03/1984   partial   COLONOSCOPY N/A 07/19/2014   Procedure: COLONOSCOPY;  Surgeon: West Bali, MD;  [facility];  Service: Endoscopy;  Laterality: N/A;  9:30   REVERSE SHOULDER ARTHROPLASTY Right 06/14/2016   Procedure: REVERSE SHOULDER ARTHROPLASTY;  Surgeon: Francena Hanly, MD;  Location: [facility];  Service: Orthopedics;  Laterality: Right;   Skin cancer removed  08/07/2016   left ear   Patient Active Problem List   Diagnosis Date Noted   Osteopenia of neck of left femur 10/01/2022   Chronic use of benzodiazepine for therapeutic purpose 10/01/2022   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   Anxiety 07/26/2017   Vaginal atrophy 04/10/2017   Urinary  incontinence 04/10/2017   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   COPD (chronic obstructive pulmonary disease) (HCC)    Asthma    Interstitial cystitis    Unspecified vitamin D deficiency 05/29/2013   Lumbar radiculopathy 05/29/2013   Depression with anxiety 02/22/2011   Diabetes mellitus (HCC) 02/22/2011   Hyperlipidemia associated with type 2 diabetes mellitus (HCC) 02/22/2011   Hypertension associated with diabetes (HCC) 02/22/2011   REFERRING PROVIDER: Delynn Flavin DO  REFERRING DIAG: Chronic left-sided low back pain with left-sided Sciatica  Rationale for Evaluation and Treatment: Rehabilitation  THERAPY DIAG:  Other low back pain  Other muscle spasm  ONSET DATE: ~03/2022.  SUBJECTIVE:  SUBJECTIVE STATEMENT: Need to leave earlier to day to make a doctor's appointment.  PERTINENT HISTORY:  TSA, OP, HTN.  PAIN:  Are you having pain? Yes: NPRS scale: 2/10 Pain location: Left buttock. Pain description: Shabbing Aggravating factors: Static standing Relieving factors: As above.  PRECAUTIONS: Other: OP.  PATIENT GOALS: Be able to do more with less pain.  OBJECTIVE:   PATIENT SURVEYS:  FOTO 39.48 on 03/04/23  POSTURE: rounded shoulders, forward head, decreased lumbar lordosis, and posterior pelvic tilt  PALPATION: Mild c/o pain in left lower lumbar region but most pain is over left Piriformis/Sciatic noth region.  LOWER EXTREMITY ROM:  WFL.  LOWER EXTREMITY MMT:   LE strength generally graded at 4+/5 bilaterally.  LUMBAR SPECIAL TESTS:  Increase pain with a left SLR when compared contralaterally.  GAIT: Slow and cautious gait pattern.  (-) Romberg test.  TODAY'S TREATMENT:                                                                                                                               DATE:                                     04/22/23 EXERCISE LOG  Exercise Repetitions and Resistance Comments  Recumbent bike L3 x 13 minutes                    F/b STW/M x 10 minutes including ischemic release technique to left lower gluteal/left ischial tuberosity region f/b pre-mod e'stim x 10 minutes. minutes.  Patient tolerated treatment without complaint with normal modality response following removal of modality.        ASSESSMENT:  CLINICAL IMPRESSION: Pain remains consistently low.  One visit remaining.  OBJECTIVE IMPAIRMENTS: Abnormal gait, decreased activity tolerance, increased muscle spasms, postural dysfunction, and pain.   ACTIVITY LIMITATIONS: carrying, lifting, bending, standing, and locomotion level  PARTICIPATION LIMITATIONS: meal prep, cleaning, and laundry  PERSONAL FACTORS: Time since onset of injury/illness/exacerbation are also affecting patient's functional outcome.   REHAB POTENTIAL: Good  CLINICAL DECISION MAKING: Stable/uncomplicated  EVALUATION COMPLEXITY: Low  GOALS:  LONG TERM GOALS: Target date: 05/14/23.  Ind with a HEP.  Goal status: On-going  2.  Stand 20 minutes with pain not > 3/10.  Goal status: On-going  3.  Perform ADL's with pain not > 3-4/10.  Goal status: On-going  4.  Eliminate left LE pain.  Goal status: On-going  PLAN:  PT FREQUENCY: 2x/week  PT DURATION: 4 weeks  PLANNED INTERVENTIONS: Therapeutic exercises, Therapeutic activity, Neuromuscular re-education, Patient/Family education, Self Care, Dry Needling, Electrical stimulation, Cryotherapy, Moist heat, Ultrasound, and Manual therapy.  PLAN FOR NEXT SESSION: Dry needling, STW/M, core exercise progression, heat, e' stim.       Zellie Jenning, [place], PT 04/22/2023, 9:33 AM

## 2023-04-29 ENCOUNTER — Ambulatory Visit: Payer: Medicare Other | Admitting: Physical Therapy

## 2023-04-29 DIAGNOSIS — M5459 Other low back pain: Secondary | ICD-10-CM | POA: Diagnosis not present

## 2023-04-29 DIAGNOSIS — R293 Abnormal posture: Secondary | ICD-10-CM

## 2023-04-29 DIAGNOSIS — M62838 Other muscle spasm: Secondary | ICD-10-CM | POA: Diagnosis not present

## 2023-04-29 NOTE — Therapy (Signed)
OUTPATIENT PHYSICAL THERAPY THORACOLUMBAR TREATMENT   Patient Name: Diana Pratt MRN: [id number] DOB:04-11-1946, 77 y.o., female Today's Date: 04/29/2023  END OF SESSION:  PT End of Session - 04/29/23 0949     Visit Number 16    Number of Visits 16    Date for PT Re-Evaluation 05/14/23    Authorization Type FOTO AT LEAST EVERY 5TH VISIT.  PROGRESS NOTE AT 10TH VISIT.  KX MODIFIER AFTER 15 VISITS.    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 Kevin Fenton   Depression    Diabetes mellitus    Diabetic neuropathy (HCC)    GERD (gastroesophageal reflux disease)    occ heartburn   Humerus fracture    right   Hyperlipemia    Hypertension    Interstitial cystitis    Osteoporosis    Pneumonia    15 yrs ago   PONV (postoperative nausea and vomiting)    Shortness of breath dyspnea    Past Surgical History:  Procedure Laterality Date   ABDOMINAL HYSTERECTOMY  03/1984   partial   COLONOSCOPY N/A 07/19/2014   Procedure: COLONOSCOPY;  Surgeon: West Bali, MD;  [facility];  Service: Endoscopy;  Laterality: N/A;  9:30   REVERSE SHOULDER ARTHROPLASTY Right 06/14/2016   Procedure: REVERSE SHOULDER ARTHROPLASTY;  Surgeon: Francena Hanly, MD;  Location: [facility];  Service: Orthopedics;  Laterality: Right;   Skin cancer removed  08/07/2016   left ear   Patient Active Problem List   Diagnosis Date Noted   Osteopenia of neck of left femur 10/01/2022   Chronic use of benzodiazepine for therapeutic purpose 10/01/2022   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   Anxiety 07/26/2017   Vaginal atrophy 04/10/2017   Urinary  incontinence 04/10/2017   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   COPD (chronic obstructive pulmonary disease) (HCC)    Asthma    Interstitial cystitis    Unspecified vitamin D deficiency 05/29/2013   Lumbar radiculopathy 05/29/2013   Depression with anxiety 02/22/2011   Diabetes mellitus (HCC) 02/22/2011   Hyperlipidemia associated with type 2 diabetes mellitus (HCC) 02/22/2011   Hypertension associated with diabetes (HCC) 02/22/2011   REFERRING PROVIDER: Delynn Flavin DO  REFERRING DIAG: Chronic left-sided low back pain with left-sided Sciatica  Rationale for Evaluation and Treatment: Rehabilitation  THERAPY DIAG:  Other low back pain  Other muscle spasm  Abnormal posture  ONSET DATE: ~03/2022.  SUBJECTIVE:  SUBJECTIVE STATEMENT: Last day.  Pain has stayed low.  PERTINENT HISTORY:  TSA, OP, HTN.  PAIN:  Are you having pain? Yes: NPRS scale: 2/10 Pain location: Left buttock. Pain description: Shabbing Aggravating factors: Static standing Relieving factors: As above.  PRECAUTIONS: Other: OP.  PATIENT GOALS: Be able to do more with less pain.  OBJECTIVE:   PATIENT SURVEYS:  FOTO 39.48 on 03/04/23  POSTURE: rounded shoulders, forward head, decreased lumbar lordosis, and posterior pelvic tilt  PALPATION: Mild c/o pain in left lower lumbar region but most pain is over left Piriformis/Sciatic noth region.  LOWER EXTREMITY ROM:  WFL.  LOWER EXTREMITY MMT:   LE strength generally graded at 4+/5 bilaterally.  LUMBAR SPECIAL TESTS:  Increase pain with a left SLR when compared contralaterally.  GAIT: Slow and cautious gait pattern.  (-) Romberg test.  TODAY'S TREATMENT:                                                                                                                               DATE:                                     04/29/23 EXERCISE LOG  Exercise Repetitions and Resistance Comments  Recumbent bike L3 x 16 minutes                    F/b STW/M x 7 minutes including ischemic release technique to left lower gluteal/left ischial tuberosity region f/b IFC at 80-150 Hz on 40% scan x 20 minutes. Patient tolerated treatment without complaint with normal modality response following removal of modality.        ASSESSMENT:  CLINICAL IMPRESSION: Patient responded very well to PT.  See below.  OBJECTIVE IMPAIRMENTS: Abnormal gait, decreased activity tolerance, increased muscle spasms, postural dysfunction, and pain.   ACTIVITY LIMITATIONS: carrying, lifting, bending, standing, and locomotion level  PARTICIPATION LIMITATIONS: meal prep, cleaning, and laundry  PERSONAL FACTORS: Time since onset of injury/illness/exacerbation are also affecting patient's functional outcome.   REHAB POTENTIAL: Good  CLINICAL DECISION MAKING: Stable/uncomplicated  EVALUATION COMPLEXITY: Low  GOALS:  LONG TERM GOALS: Target date: 05/14/23.  Ind with a HEP.  Goal status: MET.  2.  Stand 20 minutes with pain not > 3/10.  Goal status: MET.  3.  Perform ADL's with pain not > 3-4/10.  Goal status: MET.  4.  Eliminate left LE pain.  Goal status: MET.  PLAN:  PT FREQUENCY: 2x/week  PT DURATION: 4 weeks  PLANNED INTERVENTIONS: Therapeutic exercises, Therapeutic activity, Neuromuscular re-education, Patient/Family education, Self Care, Dry Needling, Electrical stimulation, Cryotherapy, Moist heat, Ultrasound, and Manual therapy.  PLAN FOR NEXT SESSION: Dry needling, STW/M, core exercise progression, heat, e' stim.   PHYSICAL THERAPY DISCHARGE SUMMARY  Visits from Start of Care: 16  Current functional level related to goals / functional outcomes: All goals met.  Remaining deficits: Pain low and localized to left ischial tuberosity  region.   Education / Equipment: HEP.   Patient agrees to discharge. Patient goals were met. Patient is being discharged due to meeting the stated rehab goals.     Nalee Lightle, [place], PT 04/29/2023, 9:50 AM

## 2023-07-01 NOTE — Progress Notes (Unsigned)
Diana Pratt is a 77 y.o. female presents to office today for annual physical exam examination.    Concerns today include: 1. ***  Occupation: ***, Marital status: ***, Substance use: *** Health Maintenance Due  Topic Date Due   COVID-19 Vaccine (1) Never done   Zoster Vaccines- Shingrix (1 of 2) Never done   DEXA SCAN  06/06/2019   OPHTHALMOLOGY EXAM  05/24/2023   INFLUENZA VACCINE  06/06/2023   Medicare Annual Wellness (AWV)  07/11/2023   Refills needed today: ***  Immunization History  Administered Date(s) Administered   DTaP 01/04/2003   Pneumococcal Conjugate-13 09/21/2019   Pneumococcal Polysaccharide-23 06/30/2018   Past Medical History:  Diagnosis Date   Anxiety    Asthma    Back pain    Cancer (HCC) 2017   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 Kevin Fenton   Depression    Diabetes mellitus    Diabetic neuropathy (HCC)    GERD (gastroesophageal reflux disease)    occ heartburn   Humerus fracture    right   Hyperlipemia    Hypertension    Interstitial cystitis    Osteoporosis    Pneumonia    15 yrs ago   PONV (postoperative nausea and vomiting)    Shortness of breath dyspnea    Social History   Socioeconomic History   Marital status: Married    Spouse name: [name]   Number of children: 1   Years of education: 13   Highest education level: Some college, no degree  Occupational History   Occupation: retired     Comment: CNA   Tobacco Use   Smoking status: Former    Current packs/day: 0.00    Average packs/day: 0.5 packs/day for 34.0 years (17.0 ttl pk-yrs)    Types: Cigarettes    Start date: 06/01/1960    Quit date: 06/01/1994    Years since quitting: 29.1   Smokeless tobacco: Never  Vaping Use   Vaping status: Never Used  Substance and Sexual Activity   Alcohol use: No   Drug use: No   Sexual activity: Yes    Birth control/protection: Surgical    Comment: hyst   Other Topics Concern   Not on file  Social History Narrative   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[place] [employer]   Social Determinants of Health   Financial Resource Strain: Low Risk  (07/10/2022)   Overall Financial Resource Strain (CARDIA)    Difficulty of Paying Living Expenses: Not hard at all  Food Insecurity: No Food Insecurity (07/10/2022)   Hunger Vital Sign    Worried About Running Out of Food in the Last Year: Never true    Ran Out of Food in the Last Year: Never true  Transportation Needs: No Transportation Needs (07/10/2022)   PRAPARE - Administrator, Civil Service (Medical): No    Lack of Transportation (Non-Medical): No  Physical Activity: Sufficiently Active (07/10/2022)   Exercise Vital Sign    Days of Exercise per Week: 3 days    Minutes of Exercise per Session: 60 min  Stress: No Stress Concern Present (07/10/2022)   [employer] of Occupational Health - Occupational Stress Questionnaire    Feeling of Stress : Not at all  Social Connections: Socially Integrated (07/10/2022)   Social Connection and Isolation Panel [NHANES]    Frequency of Communication with  Friends and Family: More than three times a week    Frequency of Social Gatherings with Friends and Family: More than three times a week    Attends Religious Services: More than 4 times per year    Active Member of Clubs or Organizations: Yes    Attends Banker Meetings: More than 4 times per year    Marital Status: Married  Catering manager Violence: Not At Risk (07/10/2022)   Humiliation, Afraid, Rape, and Kick questionnaire    Fear of Current or Ex-Partner: No    Emotionally Abused: No    Physically Abused: No    Sexually Abused: No   Past Surgical History:  Procedure Laterality Date   ABDOMINAL HYSTERECTOMY  03/1984   partial   COLONOSCOPY N/A 07/19/2014   Procedure: COLONOSCOPY;  Surgeon: West Bali, MD;  [facility];   Service: Endoscopy;  Laterality: N/A;  9:30   REVERSE SHOULDER ARTHROPLASTY Right 06/14/2016   Procedure: REVERSE SHOULDER ARTHROPLASTY;  Surgeon: Francena Hanly, MD;  Location: [facility];  Service: Orthopedics;  Laterality: Right;   Skin cancer removed  08/07/2016   left ear   Family History  Problem Relation Age of Onset   Parkinsonism Mother    Dementia Mother    Colon cancer Father 39       MULTIPLE CO-MORBIDITIES   Cancer Father    Diabetes Paternal Aunt    Diabetes Paternal Uncle    Diabetes Paternal Grandmother    Congestive Heart Failure Paternal Grandfather    Congestive Heart Failure Maternal Grandmother    Stroke Maternal Grandfather    Colon polyps Neg Hx     Current Outpatient Medications:    Accu-Chek Softclix Lancets lancets, Check blood sugar 3 times daily Dx E11.40, Disp: 300 each, Rfl: 3   albuterol (PROAIR HFA) 108 (90 Base) MCG/ACT inhaler, Inhale 2 puffs into the lungs every 6 (six) hours as needed for shortness of breath., Disp: 54 g, Rfl: 2   amLODipine (NORVASC) 10 MG tablet, Take 1 tablet (10 mg total) by mouth daily., Disp: 90 tablet, Rfl: 3   aspirin 81 MG EC tablet, Take 81 mg by mouth daily., Disp: , Rfl:    blood glucose meter kit and supplies, Dispense based on patient and insurance preference. Use up to four times daily as directed. (FOR ICD-10 E10.9, E11.9)., Disp: 1 each, Rfl: 0   Blood Glucose Monitoring Suppl (ACCU-CHEK GUIDE) w/Device KIT, Use to check blood sugar up to TID and as needed. DX E11.49, Disp: 1 kit, Rfl: 0   budesonide-formoterol (SYMBICORT) 160-4.5 MCG/ACT inhaler, Inhale 2 puffs into the lungs 2 (two) times daily., Disp: 3 each, Rfl: 4   calcium carbonate 200 MG capsule, Take 250 mg by mouth daily., Disp: , Rfl:    Cholecalciferol (VITAMIN D3) 2000 UNITS capsule, Take 2,000 Units by mouth daily. , Disp: 60 capsule, Rfl: 11   conjugated estrogens (PREMARIN) vaginal cream, APPLY ONE-HALF GRAM (ONE-FOURTH APPLICATORFUL) VAGINALLY THREE TIMES A   WEEK, Disp: 30 g, Rfl: 3   Continuous Blood Gluc Receiver (FREESTYLE LIBRE READER) DEVI, 1 Units by Does not apply route daily. UAD to test BGs daily. Dx E11.69, Disp: 1 each, Rfl: 1   Continuous Blood Gluc Sensor (FREESTYLE LIBRE 3 SENSOR) MISC, Place 1 sensor on the skin every 14 days. Use to check glucose continuously E11.69, Disp: 6 each, Rfl: 3   Evolocumab (REPATHA SURECLICK) 140 MG/ML SOAJ, Inject 140 mg into the skin every 14 (fourteen) days.,  Disp: 2 mL, Rfl: 5   FLUoxetine (PROZAC) 10 MG capsule, Take 3 capsules (30 mg total) by mouth daily., Disp: 270 capsule, Rfl: 3   fluticasone (FLONASE) 50 MCG/ACT nasal spray, Place 2 sprays into the nose daily as needed for allergies. Congestion , Disp: , Rfl:    glucose blood (ACCU-CHEK GUIDE) test strip, Use to check BG up to tid.  Dx: type 2 diabetes requiring insulin therapy E11.40, Disp: 300 each, Rfl: 12   hydrochlorothiazide (HYDRODIURIL) 25 MG tablet, Take 1 tablet (25 mg total) by mouth daily., Disp: 90 tablet, Rfl: 3   hydroxypropyl methylcellulose (ISOPTO TEARS) 2.5 % ophthalmic solution, Place 1 drop into both eyes daily as needed. for dry eyes, Disp: , Rfl:    insulin glargine (LANTUS SOLOSTAR) 100 UNIT/ML Solostar Pen, Inject 28 Units into the skin 2 (two) times daily., Disp: 60 mL, Rfl: 3   Insulin Pen Needle (PEN NEEDLES) 31G X 6 MM MISC, Use to administer insulin once qid. Dx E11.69 Use Leader brand, Disp: 100 each, Rfl: 3   LORazepam (ATIVAN) 1 MG tablet, Take 1/2-1 tablet daily if needed for anxiety.  May repeat dose once if needed during the day. PUT ON FILE, Disp: 45 tablet, Rfl: 3   metoprolol succinate (TOPROL-XL) 100 MG 24 hr tablet, Take 1 tablet (100 mg total) by mouth daily. Take with or immediately following a meal., Disp: 90 tablet, Rfl: 3   Multiple Vitamins-Minerals (CENTRUM SILVER PO), Take 1 tablet by mouth daily., Disp: , Rfl:    omeprazole (PRILOSEC) 20 MG capsule, Take 1 capsule (20 mg total) by mouth daily., Disp:  90 capsule, Rfl: 3   Semaglutide, 1 MG/DOSE, (OZEMPIC, 1 MG/DOSE,) 4 MG/3ML SOPN, INJECT 1 MG SUBCUTANEOUSLY ONCE A WEEK - (ADMINISTER BY SUBCUTANEOUS INJECTION INTO THE ABDOMEN, THIGH, OR UPPER ARM AT ANY TIME OF DAY ON THE SAME DAY EACH WEEK) DISCARD PEN 56 DAYS AFTER FIRST USE, Disp: 9 mL, Rfl: 3   tiZANidine (ZANAFLEX) 4 MG tablet, Take 0.5-1 tablets (2-4 mg total) by mouth every 8 (eight) hours as needed for muscle spasms., Disp: 30 tablet, Rfl: 0   valsartan (DIOVAN) 320 MG tablet, Take 1 tablet (320 mg total) by mouth daily., Disp: 90 tablet, Rfl: 3  Current Facility-Administered Medications:    methylPREDNISolone acetate (DEPO-MEDROL) injection 40 mg, 40 mg, Intramuscular, Once, Saxton Chain M, DO  Allergies  Allergen Reactions   Crestor [Rosuvastatin]     Myalgias    Lipitor [Atorvastatin] Other (See Comments)    Myalgias    Livalo [Pitavastatin] Other (See Comments)    myalgias   Morphine And Codeine Other (See Comments)    Burning, itching   Penicillins Hives and Itching   Simvastatin Other (See Comments)    myalgias   Sulfa Antibiotics Hives and Itching   Zetia [Ezetimibe] Cough     ROS: Review of Systems {ros; complete:30496}    Physical exam {Exam, [phone]}      01/30/2023    8:38 AM 10/01/2022    8:43 AM 07/10/2022    4:28 PM  Depression screen PHQ 2/9  Decreased Interest 0 0 0  Down, Depressed, Hopeless 0 0 1  PHQ - 2 Score 0 0 1  Altered sleeping 1 1   Tired, decreased energy 1 1   Change in appetite 0 0   Feeling bad or failure about yourself  0 0   Trouble concentrating 0 0   Moving slowly or fidgety/restless 0 0   Suicidal thoughts  0 0   PHQ-9 Score 2 2   Difficult doing work/chores Not difficult at all Somewhat difficult       01/30/2023    8:38 AM 10/01/2022    8:43 AM 09/25/2021    1:40 PM 05/25/2021    1:27 PM  GAD 7 : Generalized Anxiety Score  Nervous, Anxious, on Edge 1 1 1  0  Control/stop worrying 0 0 0 0  Worry too  much - different things 0 0 0 0  Trouble relaxing 1 1 1  0  Restless 0 0 0 0  Easily annoyed or irritable 0 1 1 0  Afraid - awful might happen 0 0 0 0  Total GAD 7 Score 2 3 3  0  Anxiety Difficulty Not difficult at all Not difficult at all Not difficult at all Not difficult at all     Assessment/ Plan: Rudi Heap here for annual physical exam.   Annual physical exam  Type 2 diabetes mellitus with other specified complication, with long-term current use of insulin (HCC)  Hypertension associated with diabetes (HCC)  Hyperlipidemia associated with type 2 diabetes mellitus (HCC)  Simple chronic bronchitis (HCC)  Depression with anxiety  Chronic use of benzodiazepine for therapeutic purpose  Controlled substance agreement signed  ***  Counseled on healthy lifestyle choices, including diet (rich in fruits, vegetables and lean meats and low in salt and simple carbohydrates) and exercise (at least 30 minutes of moderate physical activity daily).  Patient to follow up ***  Bassheva Flury M. Nadine Counts, DO

## 2023-07-02 ENCOUNTER — Ambulatory Visit (INDEPENDENT_AMBULATORY_CARE_PROVIDER_SITE_OTHER): Payer: Medicare Other | Admitting: Family Medicine

## 2023-07-02 ENCOUNTER — Encounter: Payer: Self-pay | Admitting: Family Medicine

## 2023-07-02 ENCOUNTER — Ambulatory Visit (INDEPENDENT_AMBULATORY_CARE_PROVIDER_SITE_OTHER): Payer: Medicare Other

## 2023-07-02 VITALS — BP 168/82 | HR 88 | Temp 98.5°F | Ht 64.0 in | Wt 202.0 lb

## 2023-07-02 DIAGNOSIS — E785 Hyperlipidemia, unspecified: Secondary | ICD-10-CM | POA: Diagnosis not present

## 2023-07-02 DIAGNOSIS — G720 Drug-induced myopathy: Secondary | ICD-10-CM | POA: Diagnosis not present

## 2023-07-02 DIAGNOSIS — Z794 Long term (current) use of insulin: Secondary | ICD-10-CM

## 2023-07-02 DIAGNOSIS — N952 Postmenopausal atrophic vaginitis: Secondary | ICD-10-CM

## 2023-07-02 DIAGNOSIS — J410 Simple chronic bronchitis: Secondary | ICD-10-CM

## 2023-07-02 DIAGNOSIS — Z0001 Encounter for general adult medical examination with abnormal findings: Secondary | ICD-10-CM | POA: Diagnosis not present

## 2023-07-02 DIAGNOSIS — M5442 Lumbago with sciatica, left side: Secondary | ICD-10-CM | POA: Diagnosis not present

## 2023-07-02 DIAGNOSIS — Z79899 Other long term (current) drug therapy: Secondary | ICD-10-CM

## 2023-07-02 DIAGNOSIS — E1169 Type 2 diabetes mellitus with other specified complication: Secondary | ICD-10-CM

## 2023-07-02 DIAGNOSIS — K219 Gastro-esophageal reflux disease without esophagitis: Secondary | ICD-10-CM

## 2023-07-02 DIAGNOSIS — F418 Other specified anxiety disorders: Secondary | ICD-10-CM | POA: Diagnosis not present

## 2023-07-02 DIAGNOSIS — I152 Hypertension secondary to endocrine disorders: Secondary | ICD-10-CM

## 2023-07-02 DIAGNOSIS — E1159 Type 2 diabetes mellitus with other circulatory complications: Secondary | ICD-10-CM | POA: Diagnosis not present

## 2023-07-02 DIAGNOSIS — G8929 Other chronic pain: Secondary | ICD-10-CM

## 2023-07-02 DIAGNOSIS — Z0000 Encounter for general adult medical examination without abnormal findings: Secondary | ICD-10-CM

## 2023-07-02 DIAGNOSIS — T466X5A Adverse effect of antihyperlipidemic and antiarteriosclerotic drugs, initial encounter: Secondary | ICD-10-CM

## 2023-07-02 LAB — CBC
Hematocrit: 42.2 % (ref 34.0–46.6)
Hemoglobin: 14.3 g/dL (ref 11.1–15.9)
MCH: 31 pg (ref 26.6–33.0)
MCHC: 33.9 g/dL (ref 31.5–35.7)
MCV: 92 fL (ref 79–97)
Platelets: 216 10*3/uL (ref 150–450)
RBC: 4.61 x10E6/uL (ref 3.77–5.28)
RDW: 12.8 % (ref 11.7–15.4)
WBC: 6.1 10*3/uL (ref 3.4–10.8)

## 2023-07-02 LAB — CMP14+EGFR
ALT: 16 IU/L (ref 0–32)
AST: 20 IU/L (ref 0–40)
Albumin: 4.2 g/dL (ref 3.8–4.8)
Alkaline Phosphatase: 76 IU/L (ref 44–121)
BUN/Creatinine Ratio: 22 (ref 12–28)
BUN: 20 mg/dL (ref 8–27)
Bilirubin Total: 0.4 mg/dL (ref 0.0–1.2)
CO2: 20 mmol/L (ref 20–29)
Calcium: 9.2 mg/dL (ref 8.7–10.3)
Chloride: 104 mmol/L (ref 96–106)
Creatinine, Ser: 0.93 mg/dL (ref 0.57–1.00)
Globulin, Total: 2.6 g/dL (ref 1.5–4.5)
Glucose: 103 mg/dL — ABNORMAL HIGH (ref 70–99)
Potassium: 4.1 mmol/L (ref 3.5–5.2)
Sodium: 142 mmol/L (ref 134–144)
Total Protein: 6.8 g/dL (ref 6.0–8.5)
eGFR: 63 mL/min/{1.73_m2} (ref >59)

## 2023-07-02 LAB — BAYER DCA HB A1C WAIVED: HB A1C (BAYER DCA - WAIVED): 5.6 % (ref 4.8–5.6)

## 2023-07-02 LAB — LIPID PANEL
Chol/HDL Ratio: 6.6 ratio — ABNORMAL HIGH (ref 0.0–4.4)
Cholesterol, Total: 225 mg/dL — ABNORMAL HIGH (ref 100–199)
HDL: 34 mg/dL — ABNORMAL LOW (ref >39)
LDL Chol Calc (NIH): 153 mg/dL — ABNORMAL HIGH (ref 0–99)
Triglycerides: 208 mg/dL — ABNORMAL HIGH (ref 0–149)
VLDL Cholesterol Cal: 38 mg/dL (ref 5–40)

## 2023-07-02 LAB — HM DIABETES EYE EXAM

## 2023-07-02 MED ORDER — OMEPRAZOLE 20 MG PO CPDR
20.0000 mg | DELAYED_RELEASE_CAPSULE | Freq: Every day | ORAL | 3 refills | Status: DC
Start: 2023-07-02 — End: 2024-05-11

## 2023-07-02 MED ORDER — AMLODIPINE BESYLATE 10 MG PO TABS
10.0000 mg | ORAL_TABLET | Freq: Every day | ORAL | 3 refills | Status: DC
Start: 2023-07-02 — End: 2024-05-11

## 2023-07-02 MED ORDER — LANTUS SOLOSTAR 100 UNIT/ML ~~LOC~~ SOPN
PEN_INJECTOR | SUBCUTANEOUS | 3 refills | Status: DC
Start: 2023-07-02 — End: 2024-05-11

## 2023-07-02 MED ORDER — LORAZEPAM 1 MG PO TABS
ORAL_TABLET | ORAL | 5 refills | Status: DC
Start: 2023-07-24 — End: 2023-11-04

## 2023-07-02 MED ORDER — VALSARTAN 320 MG PO TABS
320.0000 mg | ORAL_TABLET | Freq: Every day | ORAL | 3 refills | Status: DC
Start: 2023-07-02 — End: 2024-05-11

## 2023-07-02 MED ORDER — CLONIDINE 0.1 MG/24HR TD PTWK
0.1000 mg | MEDICATED_PATCH | TRANSDERMAL | 12 refills | Status: DC
Start: 2023-07-02 — End: 2024-03-17

## 2023-07-02 MED ORDER — OZEMPIC (1 MG/DOSE) 4 MG/3ML ~~LOC~~ SOPN
PEN_INJECTOR | SUBCUTANEOUS | 3 refills | Status: DC
Start: 2023-07-02 — End: 2024-05-11

## 2023-07-02 MED ORDER — REPATHA SURECLICK 140 MG/ML ~~LOC~~ SOAJ
140.0000 mg | SUBCUTANEOUS | 4 refills | Status: DC
Start: 2023-07-02 — End: 2023-11-26

## 2023-07-02 MED ORDER — PEN NEEDLES 31G X 6 MM MISC
3 refills | Status: DC
Start: 2023-07-02 — End: 2024-07-15

## 2023-07-02 MED ORDER — PREMARIN 0.625 MG/GM VA CREA
TOPICAL_CREAM | VAGINAL | 3 refills | Status: AC
Start: 2023-07-02 — End: ?

## 2023-07-02 MED ORDER — METOPROLOL SUCCINATE ER 100 MG PO TB24
100.0000 mg | ORAL_TABLET | Freq: Every day | ORAL | 3 refills | Status: DC
Start: 2023-07-02 — End: 2024-05-11

## 2023-07-02 MED ORDER — FREESTYLE LIBRE 3 SENSOR MISC
3 refills | Status: DC
Start: 2023-07-02 — End: 2023-08-27

## 2023-07-02 MED ORDER — FLUOXETINE HCL 10 MG PO CAPS
30.0000 mg | ORAL_CAPSULE | Freq: Every day | ORAL | 3 refills | Status: DC
Start: 2023-07-02 — End: 2024-05-11

## 2023-07-02 MED ORDER — HYDROCHLOROTHIAZIDE 25 MG PO TABS: 25.0000 mg | ORAL_TABLET | Freq: Every day | ORAL | 3 refills | Status: DC

## 2023-07-02 NOTE — Progress Notes (Signed)
Diana Pratt arrived 07/02/2023 and has given verbal consent to obtain images and complete their overdue diabetic retinal screening.  The images have been sent to an ophthalmologist or optometrist for review and interpretation.  Results will be sent back to [name], DO for review.  Patient has been informed they will be contacted when we receive the results via telephone or MyChart

## 2023-07-03 ENCOUNTER — Telehealth: Payer: Self-pay | Admitting: Pharmacist

## 2023-07-03 DIAGNOSIS — E785 Hyperlipidemia, unspecified: Secondary | ICD-10-CM

## 2023-07-03 LAB — MICROALBUMIN / CREATININE URINE RATIO
Creatinine, Urine: 107.9 mg/dL
Microalb/Creat Ratio: <3 mg/g{creat} (ref 0–29)
Microalbumin, Urine: <3 ug/mL

## 2023-07-03 NOTE — Telephone Encounter (Signed)
    Follow up requested by PCP for HLD.  LDL still elevated >150.  Will ensure Repatha compliance, etc.  [id number] placed.  Will follow   Lipid Panel     Component Value Date/Time   CHOL 225 (H) 07/02/2023 0904   CHOL 243 (H) 05/29/2013 1037   TRIG 208 (H) 07/02/2023 0904   TRIG 171 (H) 12/25/2013 0855   TRIG 304 (H) 05/29/2013 1037   HDL 34 (L) 07/02/2023 0904   HDL 38 (L) 12/25/2013 0855   HDL 30 (L) 05/29/2013 1037   CHOLHDL 6.6 (H) 07/02/2023 0904   LDLCALC 153 (H) 07/02/2023 0904   LDLCALC 192 (H) 12/25/2013 0855   LDLCALC 152 (H) 05/29/2013 1037   LDLDIRECT 136 (H) 09/25/2021 1342   LABVLDL 38 07/02/2023 0904    Kieth Brightly, PharmD, BCACP Clinical Pharmacist, [facility]

## 2023-07-06 LAB — TOXASSURE SELECT 13 (MW), URINE

## 2023-07-09 ENCOUNTER — Encounter: Payer: Self-pay | Admitting: Physical Therapy

## 2023-07-09 ENCOUNTER — Ambulatory Visit: Payer: Medicare Other | Attending: Family Medicine | Admitting: Physical Therapy

## 2023-07-09 DIAGNOSIS — M62838 Other muscle spasm: Secondary | ICD-10-CM | POA: Insufficient documentation

## 2023-07-09 DIAGNOSIS — M5459 Other low back pain: Secondary | ICD-10-CM | POA: Diagnosis not present

## 2023-07-09 DIAGNOSIS — G8929 Other chronic pain: Secondary | ICD-10-CM | POA: Diagnosis not present

## 2023-07-09 DIAGNOSIS — R293 Abnormal posture: Secondary | ICD-10-CM | POA: Diagnosis not present

## 2023-07-09 DIAGNOSIS — M5442 Lumbago with sciatica, left side: Secondary | ICD-10-CM | POA: Diagnosis not present

## 2023-07-09 NOTE — Therapy (Signed)
OUTPATIENT PHYSICAL THERAPY THORACOLUMBAR EVALUATION   Patient Name: Diana Pratt MRN: [id number] DOB:Mar 17, 1946, 77 y.o., female Today's Date: 07/09/2023  END OF SESSION:  PT End of Session - 07/09/23 1005     Visit Number 1    Number of Visits 12    Date for PT Re-Evaluation 10/07/23    PT Start Time 0910    PT Stop Time 0952    PT Time Calculation (min) 42 min    Activity Tolerance Patient tolerated treatment well    Behavior During Therapy [facility] for tasks assessed/performed             Past Medical History:  Diagnosis Date   Anxiety    Asthma    Back pain    Cancer (HCC) 2017   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 Kevin Fenton   Depression    Diabetes mellitus    Diabetic neuropathy (HCC)    GERD (gastroesophageal reflux disease)    occ heartburn   Humerus fracture    right   Hyperlipemia    Hypertension    Interstitial cystitis    Osteoporosis    Pneumonia    15 yrs ago   PONV (postoperative nausea and vomiting)    Shortness of breath dyspnea    Past Surgical History:  Procedure Laterality Date   ABDOMINAL HYSTERECTOMY  03/1984   partial   COLONOSCOPY N/A 07/19/2014   Procedure: COLONOSCOPY;  Surgeon: West Bali, MD;  [facility];  Service: Endoscopy;  Laterality: N/A;  9:30   REVERSE SHOULDER ARTHROPLASTY Right 06/14/2016   Procedure: REVERSE SHOULDER ARTHROPLASTY;  Surgeon: Francena Hanly, MD;  Location: [facility];  Service: Orthopedics;  Laterality: Right;   Skin cancer removed  08/07/2016   left ear   Patient Active Problem List   Diagnosis Date Noted   Osteopenia of neck of left femur 10/01/2022   Chronic use of benzodiazepine for therapeutic purpose 10/01/2022   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   Vaginal atrophy 04/10/2017   Urinary incontinence 04/10/2017   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  REFERRING DIAG: Chronic left-sided low back pain with left-sided Sciatica.  Rationale for Evaluation and Treatment: Rehabilitation  THERAPY DIAG:  Other low back pain - Plan: PT plan of care cert/re-cert  Other muscle spasm - Plan: PT plan of care cert/re-cert  Abnormal posture - Plan: PT plan of care cert/re-cert  ONSET DATE: Ongoing.  SUBJECTIVE:  SUBJECTIVE STATEMENT: The patient returns to PT with c/o left low back pain radiating to about her mid-posterior thigh region.  She was last seen on 04/29/23 and was doing very well with a low pain-level.  Per patient her pain pain starting coming back a couple of weeks ago.  Her pain is rated at a 5/10 today and can rise to higher levels when standing for a long time.  Ice and heat help decrease her pain.  PERTINENT HISTORY:  See above.  PAIN:  Are you having pain? Yes: NPRS scale: 5/10 Pain location: Left low back and into mid-posterior thigh. Pain description: Ache and sore. Aggravating factors: As above. Relieving factors: As above.  PRECAUTIONS: None  RED FLAGS: None   WEIGHT BEARING RESTRICTIONS: No  FALLS:  Has patient fallen in last 6 months? No  LIVING ENVIRONMENT: Lives with: lives with their spouse Lives in: House/apartment Has following equipment at home: None  OCCUPATION: Retired.  PLOF: Independent with basic ADLs  PATIENT GOALS: Perform ADL's with less  pain.   OBJECTIVE:   POSTURE: rounded shoulders, forward head, decreased lumbar lordosis, and posterior pelvic tilt  PALPATION: Tender over left SIJ and gluteal region with radiation of pain in her mid-posterior thigh region.  LUMBAR ROM:   Active lumbar extension limited to 10 degrees and active flexion limited by 50%.  LOWER EXTREMITY MMT:    Patient able to provide normal a normal LE strength grade bilaterally via manual muscle testing.    LUMBAR SPECIAL TESTS:  Equal leg lengths. (+) left SLR. (-) FABER test.  GAIT:     Slow and purposeful gait pattern.  TODAY'S TREATMENT:                                                                                                                              DATE:  HMP and IFC at 80-150 Hz on 40% scan x 20 minutes to patient's left low back and gluteal region.   Normal modality response following removal of modality.  ASSESSMENT:  CLINICAL IMPRESSION: The patient presents to OPPT with a flare-up of her left sided low back pain  and left posterior thigh pain. Prolonged standing and walking can increase her pain.  She demonstrated a positive left SLR.  A FABER test was negative.  Patient will benefit from skilled physical therapy intervention to address pain and deficits.  OBJECTIVE IMPAIRMENTS: decreased activity tolerance, decreased ROM, increased muscle spasms, postural dysfunction, and pain.   ACTIVITY LIMITATIONS: carrying, lifting, bending, standing, and locomotion level  PARTICIPATION LIMITATIONS: meal prep, cleaning, laundry, and community activity  PERSONAL FACTORS: Time since onset of injury/illness/exacerbation are also affecting patient's functional outcome.   REHAB POTENTIAL: Good  CLINICAL DECISION MAKING: Stable/uncomplicated  EVALUATION COMPLEXITY: Low   GOALS:  LONG TERM GOALS: Target date: 10/07/23. Ind with a HEP.  Goal status: INITIAL   2.  Stand 20 minutes with pain not > 3/10.  Goal status: INITIAL  3.  Perform ADL's with pain not > 3-4/10.  Goal status: INITIAL   4.  Eliminate left LE pain.  Goal status: INITIAL  5.  Patient walk a community distance with pain not > 3-4/10. PLAN:  PT FREQUENCY: 2x/week  PT DURATION: 4 weeks  PLANNED INTERVENTIONS: Therapeutic exercises, Therapeutic activity, Patient/Family education, Self Care, Dry Needling, Cryotherapy, Moist heat, Ultrasound, and Manual therapy.  PLAN FOR NEXT SESSION: Dry needling, STW/M, core exercise progression, heat, e' stim.    Tyreona Panjwani, [place], PT 07/09/2023, 12:38 PM

## 2023-07-11 ENCOUNTER — Encounter: Payer: Self-pay | Admitting: Physical Therapy

## 2023-07-11 ENCOUNTER — Ambulatory Visit: Payer: Medicare Other | Admitting: Physical Therapy

## 2023-07-11 DIAGNOSIS — M5459 Other low back pain: Secondary | ICD-10-CM | POA: Diagnosis not present

## 2023-07-11 DIAGNOSIS — G8929 Other chronic pain: Secondary | ICD-10-CM | POA: Diagnosis not present

## 2023-07-11 DIAGNOSIS — M62838 Other muscle spasm: Secondary | ICD-10-CM | POA: Diagnosis not present

## 2023-07-11 DIAGNOSIS — M5442 Lumbago with sciatica, left side: Secondary | ICD-10-CM | POA: Diagnosis not present

## 2023-07-11 DIAGNOSIS — R293 Abnormal posture: Secondary | ICD-10-CM | POA: Diagnosis not present

## 2023-07-11 NOTE — Therapy (Signed)
OUTPATIENT PHYSICAL THERAPY THORACOLUMBAR EVALUATION   Patient Name: Diana Pratt MRN: [id number] DOB:1946/04/14, 77 y.o., female Today's Date: 07/11/2023  END OF SESSION:  PT End of Session - 07/11/23 1433     Visit Number 2    Number of Visits 12    Date for PT Re-Evaluation 10/07/23    PT Start Time 1434    PT Stop Time 1520    PT Time Calculation (min) 46 min    Activity Tolerance Patient tolerated treatment well    Behavior During Therapy [facility] for tasks assessed/performed             Past Medical History:  Diagnosis Date   Anxiety    Asthma    Back pain    Cancer (HCC) 2017   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 Kevin Fenton   Depression    Diabetes mellitus    Diabetic neuropathy (HCC)    GERD (gastroesophageal reflux disease)    occ heartburn   Humerus fracture    right   Hyperlipemia    Hypertension    Interstitial cystitis    Osteoporosis    Pneumonia    15 yrs ago   PONV (postoperative nausea and vomiting)    Shortness of breath dyspnea    Past Surgical History:  Procedure Laterality Date   ABDOMINAL HYSTERECTOMY  03/1984   partial   COLONOSCOPY N/A 07/19/2014   Procedure: COLONOSCOPY;  Surgeon: West Bali, MD;  [facility];  Service: Endoscopy;  Laterality: N/A;  9:30   REVERSE SHOULDER ARTHROPLASTY Right 06/14/2016   Procedure: REVERSE SHOULDER ARTHROPLASTY;  Surgeon: Francena Hanly, MD;  [facility];  Service: Orthopedics;  Laterality: Right;   Skin cancer removed  08/07/2016   left ear   Patient Active Problem List   Diagnosis Date Noted   Osteopenia of neck of left femur 10/01/2022   Chronic use of benzodiazepine for therapeutic purpose 10/01/2022   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   Vaginal atrophy 04/10/2017   Urinary incontinence 04/10/2017   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  REFERRING DIAG: Chronic left-sided low back pain with left-sided Sciatica.  Rationale for Evaluation and Treatment: Rehabilitation  THERAPY DIAG:  Other low back pain  Other muscle spasm  Abnormal posture  ONSET DATE: Ongoing.  SUBJECTIVE:  SUBJECTIVE STATEMENT: Still hurting in L low back and into hip.  PERTINENT HISTORY:  See above.  PAIN:  Are you having pain? Yes: NPRS scale: 5/10 Pain location: Left low back and into mid-posterior thigh. Pain description: Ache and sore. Aggravating factors: As above. Relieving factors: As above.  PRECAUTIONS: None  RED FLAGS: None   WEIGHT BEARING RESTRICTIONS: No  FALLS:  Has patient fallen in last 6 months? No  LIVING ENVIRONMENT: Lives with: lives with their spouse Lives in: House/apartment Has following equipment at home: None  OCCUPATION: Retired.  PLOF: Independent with basic ADLs  PATIENT GOALS: Perform ADL's with less pain.   OBJECTIVE:   POSTURE: rounded shoulders, forward head, decreased lumbar lordosis, and posterior pelvic tilt  PALPATION: Tender over left SIJ and gluteal region with radiation of pain in her mid-posterior thigh region.  LUMBAR ROM:   Active lumbar extension limited to 10 degrees and active flexion limited by 50%.  LOWER EXTREMITY MMT:    Patient able to provide normal a normal LE strength grade bilaterally via manual muscle testing.     LUMBAR SPECIAL TESTS:  Equal leg lengths. (+) left SLR. (-) FABER test.  GAIT:     Slow and purposeful gait pattern.  TODAY'S TREATMENT:                                                                                                                              DATE: 07/11/23 Modalities  Date:  Unattended Estim: Lumbar, IFC, 15 mins, Pain and Tone Combo: Lumbar, 1.5 w/cm2, 100%, 1 mhz, 10 mins, Pain and Tone Hot Pack: Lumbar, 15 mins, Pain and Tone  Manual Therapy Soft Tissue Mobilization: L glute, lumbar paraspinals, reduce tone and pain    CLINICAL IMPRESSION: Patient presented in clinic with reports of increased pain based in L lumbar and hip region today. Patient presented in clinic with increased tone and pain associated with lumbar musculature. Patient had no complaints with manual therapy session. Normal modalities response noted following removal of the modalities.  OBJECTIVE IMPAIRMENTS: decreased activity tolerance, decreased ROM, increased muscle spasms, postural dysfunction, and pain.   ACTIVITY LIMITATIONS: carrying, lifting, bending, standing, and locomotion level  PARTICIPATION LIMITATIONS: meal prep, cleaning, laundry, and community activity  PERSONAL FACTORS: Time since onset of injury/illness/exacerbation are also affecting patient's functional outcome.   REHAB POTENTIAL: Good  CLINICAL DECISION MAKING: Stable/uncomplicated  EVALUATION COMPLEXITY: Low  GOALS:  LONG TERM GOALS: Target date: 10/07/23. Ind with a HEP.  Goal status: INITIAL   2.  Stand 20 minutes with pain not > 3/10.  Goal status: INITIAL   3.  Perform ADL's with pain not > 3-4/10.  Goal status: INITIAL   4.  Eliminate left LE pain.  Goal status: INITIAL  5.  Patient walk a community distance with pain not > 3-4/10. PLAN:  PT FREQUENCY: 2x/week  PT DURATION: 4 weeks  PLANNED INTERVENTIONS: Therapeutic exercises, Therapeutic activity, Patient/Family education, Self Care, Dry  Needling, Cryotherapy, Moist heat, Ultrasound, and Manual therapy.  PLAN FOR NEXT SESSION: Dry needling, STW/M, core exercise progression, heat, e' stim.   Marvell Fuller, PTA 07/11/2023, 3:23 PM

## 2023-07-15 ENCOUNTER — Ambulatory Visit: Payer: Medicare Other | Admitting: Physical Therapy

## 2023-07-15 DIAGNOSIS — G8929 Other chronic pain: Secondary | ICD-10-CM | POA: Diagnosis not present

## 2023-07-15 DIAGNOSIS — R293 Abnormal posture: Secondary | ICD-10-CM

## 2023-07-15 DIAGNOSIS — M62838 Other muscle spasm: Secondary | ICD-10-CM

## 2023-07-15 DIAGNOSIS — M5442 Lumbago with sciatica, left side: Secondary | ICD-10-CM | POA: Diagnosis not present

## 2023-07-15 DIAGNOSIS — M5459 Other low back pain: Secondary | ICD-10-CM | POA: Diagnosis not present

## 2023-07-15 NOTE — Therapy (Signed)
OUTPATIENT PHYSICAL THERAPY THORACOLUMBAR EVALUATION   Patient Name: Diana Pratt MRN: [id number] DOB:Feb 17, 1946, 77 y.o., female Today's Date: 07/15/2023  END OF SESSION:  PT End of Session - 07/15/23 1136     Visit Number 3    Number of Visits 12    Date for PT Re-Evaluation 10/07/23    PT Start Time 0850    PT Stop Time 0943    PT Time Calculation (min) 53 min    Activity Tolerance Patient tolerated treatment well    Behavior During Therapy [facility] 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 Kevin Fenton   Depression    Diabetes mellitus    Diabetic neuropathy (HCC)    GERD (gastroesophageal reflux disease)    occ heartburn   Humerus fracture    right   Hyperlipemia    Hypertension    Interstitial cystitis    Osteoporosis    Pneumonia    15 yrs ago   PONV (postoperative nausea and vomiting)    Shortness of breath dyspnea    Past Surgical History:  Procedure Laterality Date   ABDOMINAL HYSTERECTOMY  03/1984   partial   COLONOSCOPY N/A 07/19/2014   Procedure: COLONOSCOPY;  Surgeon: West Bali, MD;  Location: [facility];  Service: Endoscopy;  Laterality: N/A;  9:30   REVERSE SHOULDER ARTHROPLASTY Right 06/14/2016   Procedure: REVERSE SHOULDER ARTHROPLASTY;  Surgeon: Francena Hanly, MD;  Location: [facility] OR;  Service: Orthopedics;  Laterality: Right;   Skin cancer removed  08/07/2016   left ear   Patient Active Problem List   Diagnosis Date Noted   Osteopenia of neck of left femur 10/01/2022   Chronic use of benzodiazepine for therapeutic purpose 10/01/2022   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   Vaginal atrophy 04/10/2017   Urinary incontinence 04/10/2017   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  REFERRING DIAG: Chronic left-sided low back pain with left-sided Sciatica.  Rationale for Evaluation and Treatment: Rehabilitation  THERAPY DIAG:  Other low back pain  Other muscle spasm  Abnormal posture  ONSET DATE: Ongoing.  SUBJECTIVE:  SUBJECTIVE STATEMENT: Pain about a 5 today.  PERTINENT HISTORY:  See above.  PAIN:  Are you having pain? Yes: NPRS scale: 5/10 Pain location: Left low back and into mid-posterior thigh. Pain description: Ache and sore. Aggravating factors: As above. Relieving factors: As above.  PRECAUTIONS: None  RED FLAGS: None   WEIGHT BEARING RESTRICTIONS: No  FALLS:  Has patient fallen in last 6 months? No  LIVING ENVIRONMENT: Lives with: lives with their spouse Lives in: House/apartment Has following equipment at home: None  OCCUPATION: Retired.  PLOF: Independent with basic ADLs  PATIENT GOALS: Perform ADL's with less pain.   OBJECTIVE:   POSTURE: rounded shoulders, forward head, decreased lumbar lordosis, and posterior pelvic tilt  PALPATION: Tender over left SIJ and gluteal region with radiation of pain in her mid-posterior thigh region.  LUMBAR ROM:   Active lumbar extension limited to 10 degrees and active flexion limited by 50%.  LOWER EXTREMITY MMT:    Patient able to provide normal a normal LE strength grade bilaterally via manual muscle testing.    LUMBAR SPECIAL  TESTS:  Equal leg lengths. (+) left SLR. (-) FABER test.  GAIT:     Slow and purposeful gait pattern.  TODAY'S TREATMENT:                                                                                                                              DATE:   07/15/23:                                   EXERCISE LOG  Exercise Repetitions and Resistance Comments  Nustep level 3 x 10 minutes.                    Trigger Point Dry-Needling  Treatment instructions: Expect mild to moderate muscle soreness. S/S of pneumothorax if dry needled over a lung field, and to seek immediate medical attention should they occur. Patient verbalized understanding of these instructions and education. Patient Consent Given: Yes Education handout provided: Yes Muscles treated: Left proximal gluteal musculature.  Patient in right sdly position with folded pillow between knees for comfort:  STW/M x 13 minutes to patient's left low back, SIJ and gluteal region f/b HMP and IFC at 80-150 Hz on 40% scan x 20 minutes.  Normal modality response following removal of modality. 07/11/23 Modalities  Date:  Unattended Estim: Lumbar, IFC, 15 mins, Pain and Tone Combo: Lumbar, 1.5 w/cm2, 100%, 1 mhz, 10 mins, Pain and Tone Hot Pack: Lumbar, 15 mins, Pain and Tone  Manual Therapy Soft Tissue Mobilization: L glute, lumbar paraspinals, reduce tone and pain    CLINICAL IMPRESSION: Patient tolerated treatment well including dry needling to her left proximal gluteal region and felt better after treatment.  OBJECTIVE IMPAIRMENTS: decreased activity tolerance, decreased ROM, increased muscle spasms, postural dysfunction, and pain.   ACTIVITY LIMITATIONS: carrying,  lifting, bending, standing, and locomotion level  PARTICIPATION LIMITATIONS: meal prep, cleaning, laundry, and community activity  PERSONAL FACTORS: Time since onset of injury/illness/exacerbation are also affecting patient's functional outcome.   REHAB POTENTIAL:  Good  CLINICAL DECISION MAKING: Stable/uncomplicated  EVALUATION COMPLEXITY: Low  GOALS:  LONG TERM GOALS: Target date: 10/07/23. Ind with a HEP.  Goal status: INITIAL   2.  Stand 20 minutes with pain not > 3/10.  Goal status: INITIAL   3.  Perform ADL's with pain not > 3-4/10.  Goal status: INITIAL   4.  Eliminate left LE pain.  Goal status: INITIAL  5.  Patient walk a community distance with pain not > 3-4/10. PLAN:  PT FREQUENCY: 2x/week  PT DURATION: 4 weeks  PLANNED INTERVENTIONS: Therapeutic exercises, Therapeutic activity, Patient/Family education, Self Care, Dry Needling, Cryotherapy, Moist heat, Ultrasound, and Manual therapy.  PLAN FOR NEXT SESSION: Dry needling, STW/M, core exercise progression, heat, e' stim.   Gilverto Dileonardo, [place], PT 07/15/2023, 11:42 AM

## 2023-07-16 ENCOUNTER — Ambulatory Visit: Payer: Medicare Other

## 2023-07-16 VITALS — BP 132/63 | HR 70

## 2023-07-16 DIAGNOSIS — Z0130 Encounter for examination of blood pressure without abnormal findings: Secondary | ICD-10-CM

## 2023-07-16 NOTE — Progress Notes (Signed)
Patient here today for blood pressure check. Blood pressure was 132/63, pulse 70

## 2023-07-17 ENCOUNTER — Ambulatory Visit: Payer: Medicare Other | Admitting: Physical Therapy

## 2023-07-17 DIAGNOSIS — R293 Abnormal posture: Secondary | ICD-10-CM

## 2023-07-17 DIAGNOSIS — M5459 Other low back pain: Secondary | ICD-10-CM

## 2023-07-17 DIAGNOSIS — M62838 Other muscle spasm: Secondary | ICD-10-CM

## 2023-07-17 DIAGNOSIS — M5442 Lumbago with sciatica, left side: Secondary | ICD-10-CM | POA: Diagnosis not present

## 2023-07-17 DIAGNOSIS — G8929 Other chronic pain: Secondary | ICD-10-CM | POA: Diagnosis not present

## 2023-07-17 NOTE — Therapy (Signed)
OUTPATIENT PHYSICAL THERAPY THORACOLUMBAR EVALUATION   Patient Name: Diana Pratt MRN: [id number] DOB:1946/03/09, 77 y.o., female Today's Date: 07/17/2023  END OF SESSION:  PT End of Session - 07/17/23 0942     Visit Number 4    Number of Visits 12    Date for PT Re-Evaluation 10/07/23    PT Start Time 0848    PT Stop Time 0943    PT Time Calculation (min) 55 min    Activity Tolerance Patient tolerated treatment well    Behavior During Therapy [facility] for tasks assessed/performed             Past Medical History:  Diagnosis Date   Anxiety    Asthma    Back pain    Cancer (HCC) 2017   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 Kevin Fenton   Depression    Diabetes mellitus    Diabetic neuropathy (HCC)    GERD (gastroesophageal reflux disease)    occ heartburn   Humerus fracture    right   Hyperlipemia    Hypertension    Interstitial cystitis    Osteoporosis    Pneumonia    15 yrs ago   PONV (postoperative nausea and vomiting)    Shortness of breath dyspnea    Past Surgical History:  Procedure Laterality Date   ABDOMINAL HYSTERECTOMY  03/1984   partial   COLONOSCOPY N/A 07/19/2014   Procedure: COLONOSCOPY;  Surgeon: West Bali, MD;  [facility]: [facility];  Service: Endoscopy;  Laterality: N/A;  9:30   REVERSE SHOULDER ARTHROPLASTY Right 06/14/2016   Procedure: REVERSE SHOULDER ARTHROPLASTY;  Surgeon: Francena Hanly, MD;  Location: [facility];  Service: Orthopedics;  Laterality: Right;   Skin cancer removed  08/07/2016   left ear   Patient Active Problem List   Diagnosis Date Noted   Osteopenia of neck of left femur 10/01/2022   Chronic use of benzodiazepine for therapeutic purpose 10/01/2022   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   Vaginal atrophy 04/10/2017   Urinary incontinence 04/10/2017   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  REFERRING DIAG: Chronic left-sided low back pain with left-sided Sciatica.  Rationale for Evaluation and Treatment: Rehabilitation  THERAPY DIAG:  Other low back pain  Other muscle spasm  Abnormal posture  ONSET DATE: Ongoing.  SUBJECTIVE:  SUBJECTIVE STATEMENT: Pain at a 5 but more sore today.  PERTINENT HISTORY:  See above.  PAIN:  Are you having pain? Yes: NPRS scale: 5/10 Pain location: Left low back and into mid-posterior thigh. Pain description: Ache and sore. Aggravating factors: As above. Relieving factors: As above.  PRECAUTIONS: None  RED FLAGS: None   WEIGHT BEARING RESTRICTIONS: No  FALLS:  Has patient fallen in last 6 months? No  LIVING ENVIRONMENT: Lives with: lives with their spouse Lives in: House/apartment Has following equipment at home: None  OCCUPATION: Retired.  PLOF: Independent with basic ADLs  PATIENT GOALS: Perform ADL's with less pain.   OBJECTIVE:   POSTURE: rounded shoulders, forward head, decreased lumbar lordosis, and posterior pelvic tilt  PALPATION: Tender over left SIJ and gluteal region with radiation of pain in her mid-posterior thigh region.  LUMBAR ROM:   Active lumbar extension limited to 10 degrees and active flexion limited by 50%.  LOWER EXTREMITY MMT:    Patient able to provide normal a normal LE strength grade bilaterally via manual muscle testing.    LUMBAR  SPECIAL TESTS:  Equal leg lengths. (+) left SLR. (-) FABER test.  GAIT:     Slow and purposeful gait pattern.  TODAY'S TREATMENT:                                                                                                                              DATE:   07/17/23:                                     EXERCISE LOG  Exercise Repetitions and Resistance Comments  Nustep level 3 17 minutes.                   Dry needling to left proximal gluts and TFL f/b STW/M x 10 minutes f/b pre-mod e'stim x 20 minutes. Normal modality response following removal of modality.     07/15/23:                                   EXERCISE LOG  Exercise Repetitions and Resistance Comments  Nustep level 3 x 10 minutes.                    Trigger Point Dry-Needling  Treatment instructions: Expect mild to moderate muscle soreness. S/S of pneumothorax if dry needled over a lung field, and to seek immediate medical attention should they occur. Patient verbalized understanding of these instructions and education. Patient Consent Given: Yes Education handout provided: Yes Muscles treated: Left proximal gluteal musculature.  Patient in right sdly position with folded pillow between knees for comfort:  STW/M x 13 minutes to patient's left low back, SIJ and gluteal region f/b HMP and IFC at 80-150 Hz on 40% scan  x 20 minutes.  Normal modality response following removal of modality. 07/11/23 Modalities  Date:  Unattended Estim: Lumbar, IFC, 15 mins, Pain and Tone Combo: Lumbar, 1.5 w/cm2, 100%, 1 mhz, 10 mins, Pain and Tone Hot Pack: Lumbar, 15 mins, Pain and Tone  Manual Therapy Soft Tissue Mobilization: L glute, lumbar paraspinals, reduce tone and pain    CLINICAL IMPRESSION: Patient reporting more soreness today.  She had tenderness in the region of her ischial tuberosity today.  Following treatment she felt better.  OBJECTIVE IMPAIRMENTS: decreased activity tolerance, decreased ROM, increased  muscle spasms, postural dysfunction, and pain.   ACTIVITY LIMITATIONS: carrying, lifting, bending, standing, and locomotion level  PARTICIPATION LIMITATIONS: meal prep, cleaning, laundry, and community activity  PERSONAL FACTORS: Time since onset of injury/illness/exacerbation are also affecting patient's functional outcome.   REHAB POTENTIAL: Good  CLINICAL DECISION MAKING: Stable/uncomplicated  EVALUATION COMPLEXITY: Low  GOALS:  LONG TERM GOALS: Target date: 10/07/23. Ind with a HEP.  Goal status: INITIAL   2.  Stand 20 minutes with pain not > 3/10.  Goal status: INITIAL   3.  Perform ADL's with pain not > 3-4/10.  Goal status: INITIAL   4.  Eliminate left LE pain.  Goal status: INITIAL  5.  Patient walk a community distance with pain not > 3-4/10. PLAN:  PT FREQUENCY: 2x/week  PT DURATION: 4 weeks  PLANNED INTERVENTIONS: Therapeutic exercises, Therapeutic activity, Patient/Family education, Self Care, Dry Needling, Cryotherapy, Moist heat, Ultrasound, and Manual therapy.  PLAN FOR NEXT SESSION: Dry needling, STW/M, core exercise progression, heat, e' stim.   Emrah Ariola, [place], PT 07/17/2023, 9:56 AM

## 2023-07-22 ENCOUNTER — Ambulatory Visit: Payer: Medicare Other | Admitting: Physical Therapy

## 2023-07-22 DIAGNOSIS — M62838 Other muscle spasm: Secondary | ICD-10-CM

## 2023-07-22 DIAGNOSIS — R293 Abnormal posture: Secondary | ICD-10-CM

## 2023-07-22 DIAGNOSIS — M5459 Other low back pain: Secondary | ICD-10-CM | POA: Diagnosis not present

## 2023-07-22 DIAGNOSIS — G8929 Other chronic pain: Secondary | ICD-10-CM | POA: Diagnosis not present

## 2023-07-22 DIAGNOSIS — M5442 Lumbago with sciatica, left side: Secondary | ICD-10-CM | POA: Diagnosis not present

## 2023-07-22 NOTE — Therapy (Signed)
OUTPATIENT PHYSICAL THERAPY THORACOLUMBAR EVALUATION   Patient Name: Diana Pratt MRN: [id number] DOB:09/03/1946, 77 y.o., female Today's Date: 07/22/2023  END OF SESSION:  PT End of Session - 07/22/23 0928     Visit Number 5    Number of Visits 12    Date for PT Re-Evaluation 10/07/23    PT Start Time 0854    PT Stop Time 0948    PT Time Calculation (min) 54 min    Activity Tolerance Patient tolerated treatment well    Behavior During Therapy [facility] for tasks assessed/performed             Past Medical History:  Diagnosis Date   Anxiety    Asthma    Back pain    Cancer (HCC) 2017   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 Kevin Fenton   Depression    Diabetes mellitus    Diabetic neuropathy (HCC)    GERD (gastroesophageal reflux disease)    occ heartburn   Humerus fracture    right   Hyperlipemia    Hypertension    Interstitial cystitis    Osteoporosis    Pneumonia    15 yrs ago   PONV (postoperative nausea and vomiting)    Shortness of breath dyspnea    Past Surgical History:  Procedure Laterality Date   ABDOMINAL HYSTERECTOMY  03/1984   partial   COLONOSCOPY N/A 07/19/2014   Procedure: COLONOSCOPY;  Surgeon: West Bali, MD;  Location: [facility];  Service: Endoscopy;  Laterality: N/A;  9:30   REVERSE SHOULDER ARTHROPLASTY Right 06/14/2016   Procedure: REVERSE SHOULDER ARTHROPLASTY;  Surgeon: Francena Hanly, MD;  Location: [facility];  Service: Orthopedics;  Laterality: Right;   Skin cancer removed  08/07/2016   left ear   Patient Active Problem List   Diagnosis Date Noted   Osteopenia of neck of left femur 10/01/2022   Chronic use of benzodiazepine for therapeutic purpose 10/01/2022   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   Vaginal atrophy 04/10/2017   Urinary incontinence 04/10/2017   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  REFERRING DIAG: Chronic left-sided low back pain with left-sided Sciatica.  Rationale for Evaluation and Treatment: Rehabilitation  THERAPY DIAG:  Other low back pain  Other muscle spasm  Abnormal posture  ONSET DATE: Ongoing.  SUBJECTIVE:  SUBJECTIVE STATEMENT: Sore, pain at a 4 today.  PERTINENT HISTORY:  See above.  PAIN:  Are you having pain? Yes: NPRS scale: 4/10 Pain location: Left low back and into mid-posterior thigh. Pain description: Ache and sore. Aggravating factors: As above. Relieving factors: As above.  PRECAUTIONS: None  RED FLAGS: None   WEIGHT BEARING RESTRICTIONS: No  FALLS:  Has patient fallen in last 6 months? No  LIVING ENVIRONMENT: Lives with: lives with their spouse Lives in: House/apartment Has following equipment at home: None  OCCUPATION: Retired.  PLOF: Independent with basic ADLs  PATIENT GOALS: Perform ADL's with less pain.   OBJECTIVE:   POSTURE: rounded shoulders, forward head, decreased lumbar lordosis, and posterior pelvic tilt  PALPATION: Tender over left SIJ and gluteal region with radiation of pain in her mid-posterior thigh region.  LUMBAR ROM:   Active lumbar extension limited to 10 degrees and active flexion limited by 50%.  LOWER EXTREMITY MMT:    Patient able to provide normal a normal LE strength grade bilaterally via manual muscle testing.    LUMBAR SPECIAL  TESTS:  Equal leg lengths. (+) left SLR. (-) FABER test.  GAIT:     Slow and purposeful gait pattern.  TODAY'S TREATMENT:                                                                                                                              DATE:   07/22/23:                                     EXERCISE LOG  Exercise Repetitions and Resistance Comments  Nustep Level 3 15 minutes                   Dry needling to left proximal gluts and TFL f/b STW/M x 8 minutes f/b pre-mod e'stim x 20 minutes. Normal modality response following removal of modality.    07/17/23:                                     EXERCISE LOG  Exercise Repetitions and Resistance Comments  Nustep level 3 17 minutes.                   Dry needling to left proximal gluts and TFL f/b STW/M x 10 minutes f/b pre-mod e'stim x 20 minutes. Normal modality response following removal of modality.     07/15/23:                                   EXERCISE LOG  Exercise Repetitions and Resistance Comments  Nustep level 3 x 10 minutes.  Trigger Point Dry-Needling  Treatment instructions: Expect mild to moderate muscle soreness. S/S of pneumothorax if dry needled over a lung field, and to seek immediate medical attention should they occur. Patient verbalized understanding of these instructions and education. Patient Consent Given: Yes Education handout provided: Yes Muscles treated: Left proximal gluteal musculature.  Patient in right sdly position with folded pillow between knees for comfort:  STW/M x 13 minutes to patient's left low back, SIJ and gluteal region f/b HMP and IFC at 80-150 Hz on 40% scan x 20 minutes.  Normal modality response following removal of modality. 07/11/23 Modalities  Date:  Unattended Estim: Lumbar, IFC, 15 mins, Pain and Tone Combo: Lumbar, 1.5 w/cm2, 100%, 1 mhz, 10 mins, Pain and Tone Hot Pack: Lumbar, 15 mins, Pain and Tone  Manual Therapy Soft Tissue  Mobilization: L glute, lumbar paraspinals, reduce tone and pain    CLINICAL IMPRESSION: Patient with less pain today.  Palpable pain over left ischial tuberosity region with very good response to treatment today and less pain following.  OBJECTIVE IMPAIRMENTS: decreased activity tolerance, decreased ROM, increased muscle spasms, postural dysfunction, and pain.   ACTIVITY LIMITATIONS: carrying, lifting, bending, standing, and locomotion level  PARTICIPATION LIMITATIONS: meal prep, cleaning, laundry, and community activity  PERSONAL FACTORS: Time since onset of injury/illness/exacerbation are also affecting patient's functional outcome.   REHAB POTENTIAL: Good  CLINICAL DECISION MAKING: Stable/uncomplicated  EVALUATION COMPLEXITY: Low  GOALS:  LONG TERM GOALS: Target date: 10/07/23. Ind with a HEP.  Goal status: INITIAL   2.  Stand 20 minutes with pain not > 3/10.  Goal status: INITIAL   3.  Perform ADL's with pain not > 3-4/10.  Goal status: INITIAL   4.  Eliminate left LE pain.  Goal status: INITIAL  5.  Patient walk a community distance with pain not > 3-4/10. PLAN:  PT FREQUENCY: 2x/week  PT DURATION: 4 weeks  PLANNED INTERVENTIONS: Therapeutic exercises, Therapeutic activity, Patient/Family education, Self Care, Dry Needling, Cryotherapy, Moist heat, Ultrasound, and Manual therapy.  PLAN FOR NEXT SESSION: Dry needling, STW/M, core exercise progression, heat, e' stim.   Jahquan Klugh, [place], PT 07/22/2023, 10:15 AM

## 2023-07-23 ENCOUNTER — Institutional Professional Consult (permissible substitution): Payer: Medicare Other | Admitting: Professional Counselor

## 2023-07-24 ENCOUNTER — Ambulatory Visit: Payer: Medicare Other | Admitting: Physical Therapy

## 2023-07-24 DIAGNOSIS — G8929 Other chronic pain: Secondary | ICD-10-CM | POA: Diagnosis not present

## 2023-07-24 DIAGNOSIS — R293 Abnormal posture: Secondary | ICD-10-CM | POA: Diagnosis not present

## 2023-07-24 DIAGNOSIS — M5459 Other low back pain: Secondary | ICD-10-CM

## 2023-07-24 DIAGNOSIS — M62838 Other muscle spasm: Secondary | ICD-10-CM

## 2023-07-24 DIAGNOSIS — M5442 Lumbago with sciatica, left side: Secondary | ICD-10-CM | POA: Diagnosis not present

## 2023-07-24 NOTE — Therapy (Addendum)
[facility] OUTPATIENT PHYSICAL THERAPY THORACOLUMBAR EVALUATION   Patient Name: Diana Pratt MRN: [id number] DOB:1946/03/08, 77 y.o., female Today's Date: 07/24/2023  END OF SESSION:  PT End of Session - 07/24/23 0900     Visit Number 6    Number of Visits 12    Date for PT Re-Evaluation 10/07/23    PT Start Time 0853    PT Stop Time 0949    PT Time Calculation (min) 56 min    Activity Tolerance Patient tolerated treatment well    Behavior During Therapy [facility] for tasks assessed/performed             Past Medical History:  Diagnosis Date   Anxiety    Asthma    Back pain    Cancer (HCC) 2017   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 Kevin Fenton   Depression    Diabetes mellitus    Diabetic neuropathy (HCC)    GERD (gastroesophageal reflux disease)    occ heartburn   Humerus fracture    right   Hyperlipemia    Hypertension    Interstitial cystitis    Osteoporosis    Pneumonia    15 yrs ago   PONV (postoperative nausea and vomiting)    Shortness of breath dyspnea    Past Surgical History:  Procedure Laterality Date   ABDOMINAL HYSTERECTOMY  03/1984   partial   COLONOSCOPY N/A 07/19/2014   Procedure: COLONOSCOPY;  Surgeon: West Bali, MD;  [facility];  Service: Endoscopy;  Laterality: N/A;  9:30   REVERSE SHOULDER ARTHROPLASTY Right 06/14/2016   Procedure: REVERSE SHOULDER ARTHROPLASTY;  Surgeon: Francena Hanly, MD;  Location: [facility];  Service: Orthopedics;  Laterality: Right;   Skin cancer removed  08/07/2016   left ear   Patient Active Problem List   Diagnosis Date Noted   Osteopenia of neck of left femur 10/01/2022   Chronic use of benzodiazepine for therapeutic purpose 10/01/2022   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   Vaginal atrophy 04/10/2017   Urinary incontinence 04/10/2017   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  REFERRING DIAG: Chronic left-sided low back pain with left-sided Sciatica.  Rationale for Evaluation and Treatment: Rehabilitation  THERAPY DIAG:  Other low back pain  Other muscle spasm  Abnormal posture  ONSET DATE: Ongoing.  SUBJECTIVE:  SUBJECTIVE STATEMENT: No pain increase since last visit.  PERTINENT HISTORY:  See above.  PAIN:  Are you having pain? Yes: NPRS scale: 4/10 Pain location: Left low back and into mid-posterior thigh. Pain description: Ache and sore. Aggravating factors: As above. Relieving factors: As above.  PRECAUTIONS: None  RED FLAGS: None   WEIGHT BEARING RESTRICTIONS: No  FALLS:  Has patient fallen in last 6 months? No  LIVING ENVIRONMENT: Lives with: lives with their spouse Lives in: House/apartment Has following equipment at home: None  OCCUPATION: Retired.  PLOF: Independent with basic ADLs  PATIENT GOALS: Perform ADL's with less pain.   OBJECTIVE:   POSTURE: rounded shoulders, forward head, decreased lumbar lordosis, and posterior pelvic tilt  PALPATION: Tender over left SIJ and gluteal region with radiation of pain in her mid-posterior thigh region.  LUMBAR ROM:   Active lumbar extension limited to 10 degrees and active flexion limited by 50%.  LOWER EXTREMITY MMT:    Patient able to provide normal a normal LE strength grade bilaterally via manual muscle testing.     LUMBAR SPECIAL TESTS:  Equal leg lengths. (+) left SLR. (-) FABER test.  GAIT:     Slow and purposeful gait pattern.  TODAY'S TREATMENT:                                                                                                                              DATE:   07/24/23:                                     EXERCISE LOG  Exercise Repetitions and Resistance Comments  Nustep Level 3 x 12 minutes   Seated left ham curls Red band to fatigue x 2           STW/M x 10 minutes to patient's left affected gluteal region f/b IFC at 80-150 Hz on 40% scan x 20 minutes.  Normal modality response following removal of modality.  07/22/23:                                     EXERCISE LOG  Exercise Repetitions and Resistance Comments  Nustep Level 3 15 minutes                   Dry needling to left proximal gluts and TFL f/b STW/M x 8 minutes f/b pre-mod e'stim x 20 minutes. Normal modality response following removal of modality.    07/17/23:                                     EXERCISE LOG  Exercise Repetitions and Resistance Comments  Nustep level 3 17 minutes.  Dry needling to left proximal gluts and TFL f/b STW/M x 10 minutes f/b pre-mod e'stim x 20 minutes. Normal modality response following removal of modality.     07/15/23:                                   EXERCISE LOG  Exercise Repetitions and Resistance Comments  Nustep level 3 x 10 minutes.                    Trigger Point Dry-Needling  Treatment instructions: Expect mild to moderate muscle soreness. S/S of pneumothorax if dry needled over a lung field, and to seek immediate medical attention should they occur. Patient verbalized understanding of these instructions and education. Patient Consent Given: Yes Education handout provided: Yes Muscles treated: Left proximal gluteal musculature.  Patient in right sdly position with folded pillow between knees for comfort:  STW/M x 13  minutes to patient's left low back, SIJ and gluteal region f/b HMP and IFC at 80-150 Hz on 40% scan x 20 minutes.  Normal modality response following removal of modality. 07/11/23 Modalities  Date:  Unattended Estim: Lumbar, IFC, 15 mins, Pain and Tone Combo: Lumbar, 1.5 w/cm2, 100%, 1 mhz, 10 mins, Pain and Tone Hot Pack: Lumbar, 15 mins, Pain and Tone  Manual Therapy Soft Tissue Mobilization: L glute, lumbar paraspinals, reduce tone and pain    CLINICAL IMPRESSION: No pain increase since last treatment.  She did very well today with treatment.  Patient performed seated left hamstring curls with red theraband without compliant.  Patient states she has red theraband at home so she will perform as a home exercise.  OBJECTIVE IMPAIRMENTS: decreased activity tolerance, decreased ROM, increased muscle spasms, postural dysfunction, and pain.   ACTIVITY LIMITATIONS: carrying, lifting, bending, standing, and locomotion level  PARTICIPATION LIMITATIONS: meal prep, cleaning, laundry, and community activity  PERSONAL FACTORS: Time since onset of injury/illness/exacerbation are also affecting patient's functional outcome.   REHAB POTENTIAL: Good  CLINICAL DECISION MAKING: Stable/uncomplicated  EVALUATION COMPLEXITY: Low  GOALS:  LONG TERM GOALS: Target date: 10/07/23. Ind with a HEP.  Goal status: INITIAL   2.  Stand 20 minutes with pain not > 3/10.  Goal status: INITIAL   3.  Perform ADL's with pain not > 3-4/10.  Goal status: INITIAL   4.  Eliminate left LE pain.  Goal status: INITIAL  5.  Patient walk a community distance with pain not > 3-4/10. PLAN:  PT FREQUENCY: 2x/week  PT DURATION: 4 weeks  PLANNED INTERVENTIONS: Therapeutic exercises, Therapeutic activity, Patient/Family education, Self Care, Dry Needling, Cryotherapy, Moist heat, Ultrasound, and Manual therapy.  PLAN FOR NEXT SESSION: Dry needling, STW/M, core exercise progression, heat, e' stim.   Brenna Friesenhahn, [place],  PT 07/24/2023, 9:55 AM

## 2023-07-29 ENCOUNTER — Ambulatory Visit: Payer: Medicare Other | Admitting: Physical Therapy

## 2023-07-29 DIAGNOSIS — M5459 Other low back pain: Secondary | ICD-10-CM

## 2023-07-29 DIAGNOSIS — R293 Abnormal posture: Secondary | ICD-10-CM | POA: Diagnosis not present

## 2023-07-29 DIAGNOSIS — M62838 Other muscle spasm: Secondary | ICD-10-CM

## 2023-07-29 DIAGNOSIS — M5442 Lumbago with sciatica, left side: Secondary | ICD-10-CM | POA: Diagnosis not present

## 2023-07-29 DIAGNOSIS — G8929 Other chronic pain: Secondary | ICD-10-CM | POA: Diagnosis not present

## 2023-07-29 NOTE — Therapy (Signed)
[facility] OUTPATIENT PHYSICAL THERAPY THORACOLUMBAR EVALUATION   Patient Name: Diana Pratt MRN: [id number] DOB:[birth year], 77 y.o., female Today's Date: 07/29/2023  END OF SESSION:  PT End of Session - 07/29/23 1007     Visit Number 7    Number of Visits 12    Date for PT Re-Evaluation 10/07/23    PT Start Time 0845    PT Stop Time 0940    PT Time Calculation (min) 55 min    Activity Tolerance Patient tolerated treatment well    Behavior During Therapy [facility] for tasks assessed/performed             Past Medical History:  Diagnosis Date   Anxiety    Asthma    Back pain    Cancer (HCC) 2017   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 Kevin Fenton   Depression    Diabetes mellitus    Diabetic neuropathy (HCC)    GERD (gastroesophageal reflux disease)    occ heartburn   Humerus fracture    right   Hyperlipemia    Hypertension    Interstitial cystitis    Osteoporosis    Pneumonia    15 yrs ago   PONV (postoperative nausea and vomiting)    Shortness of breath dyspnea    Past Surgical History:  Procedure Laterality Date   ABDOMINAL HYSTERECTOMY  03/1984   partial   COLONOSCOPY N/A 07/19/2014   Procedure: COLONOSCOPY;  Surgeon: West Bali, MD;  [facility];  Service: Endoscopy;  Laterality: N/A;  9:30   REVERSE SHOULDER ARTHROPLASTY Right 06/14/2016   Procedure: REVERSE SHOULDER ARTHROPLASTY;  Surgeon: Francena Hanly, MD;  Location: [facility];  Service: Orthopedics;  Laterality: Right;   Skin cancer removed  08/07/2016   left ear   Patient Active Problem List   Diagnosis Date Noted   Osteopenia of neck of left femur 10/01/2022   Chronic use of benzodiazepine for therapeutic purpose 10/01/2022   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   Vaginal atrophy 04/10/2017   Urinary incontinence 04/10/2017   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  REFERRING DIAG: Chronic left-sided low back pain with left-sided Sciatica.  Rationale for Evaluation and Treatment: Rehabilitation  THERAPY DIAG:  Other low back pain  Other muscle spasm  Abnormal posture  ONSET DATE: Ongoing.  SUBJECTIVE:  SUBJECTIVE STATEMENT: Pain down to a 3.  PERTINENT HISTORY:  See above.  PAIN:  Are you having pain? Yes: NPRS scale: 3/10 Pain location: Left low back and into mid-posterior thigh. Pain description: Ache and sore. Aggravating factors: As above. Relieving factors: As above.  PRECAUTIONS: None  RED FLAGS: None   WEIGHT BEARING RESTRICTIONS: No  FALLS:  Has patient fallen in last 6 months? No  LIVING ENVIRONMENT: Lives with: lives with their spouse Lives in: House/apartment Has following equipment at home: None  OCCUPATION: Retired.  PLOF: Independent with basic ADLs  PATIENT GOALS: Perform ADL's with less pain.   OBJECTIVE:   POSTURE: rounded shoulders, forward head, decreased lumbar lordosis, and posterior pelvic tilt  PALPATION: Tender over left SIJ and gluteal region with radiation of pain in her mid-posterior thigh region.  LUMBAR ROM:   Active lumbar extension limited to 10 degrees and active flexion limited by 50%.  LOWER EXTREMITY MMT:    Patient able to provide normal a normal LE strength grade bilaterally via manual muscle testing.    LUMBAR SPECIAL  TESTS:  Equal leg lengths. (+) left SLR. (-) FABER test.  GAIT:     Slow and purposeful gait pattern.  TODAY'S TREATMENT:                                                                                                                              DATE:   07/29/23:                                     EXERCISE LOG  Exercise Repetitions and Resistance Comments  Nustep Level 4 x 11 minutes.                   Dry needling to left proximal glut med f/b STW/M x 13 minutes f/b HMP and IFC at 80-150 Hz on 40% scan x 20 minutes.    07/24/23:                                     EXERCISE LOG  Exercise Repetitions and Resistance Comments  Nustep Level 3 x 12 minutes   Seated left ham curls Red band to fatigue x 2           STW/M x 10 minutes to patient's left affected gluteal region f/b IFC at 80-150 Hz on 40% scan x 20 minutes.  Normal modality response following removal of modality.  07/22/23:                                     EXERCISE LOG  Exercise Repetitions and Resistance Comments  Nustep Level 3 15 minutes  Dry needling to left proximal gluts and TFL f/b STW/M x 8 minutes f/b pre-mod e'stim x 20 minutes. Normal modality response following removal of modality.    CLINICAL IMPRESSION: Pain now lowered to a 3/10.  She is very pleased with her pain reduction.  One visit remaining.  OBJECTIVE IMPAIRMENTS: decreased activity tolerance, decreased ROM, increased muscle spasms, postural dysfunction, and pain.   ACTIVITY LIMITATIONS: carrying, lifting, bending, standing, and locomotion level  PARTICIPATION LIMITATIONS: meal prep, cleaning, laundry, and community activity  PERSONAL FACTORS: Time since onset of injury/illness/exacerbation are also affecting patient's functional outcome.   REHAB POTENTIAL: Good  CLINICAL DECISION MAKING: Stable/uncomplicated  EVALUATION COMPLEXITY: Low  GOALS:  LONG TERM GOALS: Target date: 10/07/23. Ind with a HEP.   Goal status: INITIAL   2.  Stand 20 minutes with pain not > 3/10.  Goal status: INITIAL   3.  Perform ADL's with pain not > 3-4/10.  Goal status: INITIAL   4.  Eliminate left LE pain.  Goal status: INITIAL  5.  Patient walk a community distance with pain not > 3-4/10. PLAN:  PT FREQUENCY: 2x/week  PT DURATION: 4 weeks  PLANNED INTERVENTIONS: Therapeutic exercises, Therapeutic activity, Patient/Family education, Self Care, Dry Needling, Cryotherapy, Moist heat, Ultrasound, and Manual therapy.  PLAN FOR NEXT SESSION: Dry needling, STW/M, core exercise progression, heat, e' stim.   Marsi Turvey, [place], PT 07/29/2023, 10:08 AM

## 2023-07-31 ENCOUNTER — Ambulatory Visit: Payer: Medicare Other

## 2023-07-31 DIAGNOSIS — M5459 Other low back pain: Secondary | ICD-10-CM

## 2023-07-31 DIAGNOSIS — G8929 Other chronic pain: Secondary | ICD-10-CM | POA: Diagnosis not present

## 2023-07-31 DIAGNOSIS — M62838 Other muscle spasm: Secondary | ICD-10-CM | POA: Diagnosis not present

## 2023-07-31 DIAGNOSIS — M5442 Lumbago with sciatica, left side: Secondary | ICD-10-CM | POA: Diagnosis not present

## 2023-07-31 DIAGNOSIS — R293 Abnormal posture: Secondary | ICD-10-CM | POA: Diagnosis not present

## 2023-07-31 NOTE — Therapy (Addendum)
OUTPATIENT PHYSICAL THERAPY THORACOLUMBAR TREATMENT   Patient Name: Diana Pratt MRN: [id number] DOB:1946-10-13, 77 y.o., female Today's Date: 07/31/2023  END OF SESSION:  PT End of Session - 07/31/23 0855     Visit Number 8    Number of Visits 12    Date for PT Re-Evaluation 10/07/23    PT Start Time 0850    PT Stop Time 0945    PT Time Calculation (min) 55 min    Activity Tolerance Patient tolerated treatment well    Behavior During Therapy [facility] for tasks assessed/performed             Past Medical History:  Diagnosis Date   Anxiety    Asthma    Back pain    Cancer (HCC) 2017   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 Kevin Fenton   Depression    Diabetes mellitus    Diabetic neuropathy (HCC)    GERD (gastroesophageal reflux disease)    occ heartburn   Humerus fracture    right   Hyperlipemia    Hypertension    Interstitial cystitis    Osteoporosis    Pneumonia    15 yrs ago   PONV (postoperative nausea and vomiting)    Shortness of breath dyspnea    Past Surgical History:  Procedure Laterality Date   ABDOMINAL HYSTERECTOMY  03/1984   partial   COLONOSCOPY N/A 07/19/2014   Procedure: COLONOSCOPY;  Surgeon: West Bali, MD;  [facility]: [facility];  Service: Endoscopy;  Laterality: N/A;  9:30   REVERSE SHOULDER ARTHROPLASTY Right 06/14/2016   Procedure: REVERSE SHOULDER ARTHROPLASTY;  Surgeon: Francena Hanly, MD;  Location: [facility];  Service: Orthopedics;  Laterality: Right;   Skin cancer removed  08/07/2016   left ear   Patient Active Problem List   Diagnosis Date Noted   Osteopenia of neck of left femur 10/01/2022   Chronic use of benzodiazepine for therapeutic purpose 10/01/2022   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   Vaginal atrophy 04/10/2017   Urinary incontinence 04/10/2017   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  REFERRING DIAG: Chronic left-sided low back pain with left-sided Sciatica.  Rationale for Evaluation and Treatment: Rehabilitation  THERAPY DIAG:  Other low back pain  Other muscle spasm  Abnormal posture  ONSET DATE: Ongoing.  SUBJECTIVE:  SUBJECTIVE STATEMENT: Pt reports 3/10 left buttock pain today.  Ready for discharge.   PERTINENT HISTORY:  See above.  PAIN:  Are you having pain? Yes: NPRS scale: 3/10 Pain location: Left low back and into mid-posterior thigh. Pain description: Ache and sore. Aggravating factors: As above. Relieving factors: As above.  PRECAUTIONS: None  RED FLAGS: None   WEIGHT BEARING RESTRICTIONS: No  FALLS:  Has patient fallen in last 6 months? No  LIVING ENVIRONMENT: Lives with: lives with their spouse Lives in: House/apartment Has following equipment at home: None  OCCUPATION: Retired.  PLOF: Independent with basic ADLs  PATIENT GOALS: Perform ADL's with less pain.   OBJECTIVE:   POSTURE: rounded shoulders, forward head, decreased lumbar lordosis, and posterior pelvic tilt  PALPATION: Tender over left SIJ and gluteal region with radiation of pain in her mid-posterior thigh region.  LUMBAR ROM:   Active lumbar extension limited to 10 degrees and active flexion limited by 50%.  LOWER EXTREMITY MMT:    Patient able to provide normal a normal LE strength grade bilaterally via  manual muscle testing.    LUMBAR SPECIAL TESTS:  Equal leg lengths. (+) left SLR. (-) FABER test.  GAIT:     Slow and purposeful gait pattern.  TODAY'S TREATMENT:                                                                                                                              DATE: 07/31/23:                                   EXERCISE LOG  Exercise Repetitions and Resistance Comments  Nustep Level 4 x 15 minutes.   SKTC in seated 10 reps bil w/ 3 sec hold   Figure 4 stretch seated 10 reps bil w/ 3 sec hold            Manual Therapy Soft Tissue Mobilization: left glute, STW/M to left glute and piriformis to decrease pain and tone with pt in right side-lying for comfort with pillow between her knees    Modalities  Date:  Unattended Estim: left glute, IFC 80-150 Hz, 15 mins, Pain Hot Pack: left glute, 15 mins, Pain and Tone  07/24/23:                                     EXERCISE LOG  Exercise Repetitions and Resistance Comments  Nustep Level 3 x 12 minutes   Seated left ham curls Red band to fatigue x 2           STW/M x 10 minutes to patient's left affected gluteal region f/b IFC at 80-150 Hz on 40% scan x 20 minutes.  Normal modality response following removal of modality.  07/22/23:  EXERCISE LOG  Exercise Repetitions and Resistance Comments  Nustep Level 3 15 minutes                   Dry needling to left proximal gluts and TFL f/b STW/M x 8 minutes f/b pre-mod e'stim x 20 minutes. Normal modality response following removal of modality.    CLINICAL IMPRESSION: Pt arrives for today's treatment session reporting 3/10 left glute/hip pain.  Pt able to increased FOTO score to 67 today surpassing her predicted score.  Pt has met all the goals set forth for her by physical therapy at this time.  STW/M performed to left glute and piriformis to decrease pain and tone.  Normal responses to estim and MH noted upon removal.  Pt  encouraged to call the facility with any questions or concerns.  Pt ready for discharge at this time.   OBJECTIVE IMPAIRMENTS: decreased activity tolerance, decreased ROM, increased muscle spasms, postural dysfunction, and pain.   ACTIVITY LIMITATIONS: carrying, lifting, bending, standing, and locomotion level  PARTICIPATION LIMITATIONS: meal prep, cleaning, laundry, and community activity  PERSONAL FACTORS: Time since onset of injury/illness/exacerbation are also affecting patient's functional outcome.   REHAB POTENTIAL: Good  CLINICAL DECISION MAKING: Stable/uncomplicated  EVALUATION COMPLEXITY: Low  GOALS:  LONG TERM GOALS: Target date: 10/07/23. Ind with a HEP.  Goal status: MET   2.  Stand 20 minutes with pain not > 3/10.  Goal status: MET   3.  Perform ADL's with pain not > 3-4/10.  Goal status: MET   4.  Eliminate left LE pain.  Goal status: MET  5.  Patient walk a community distance with pain not > 3-4/10.  Goal status: MET PLAN:  PT FREQUENCY: 2x/week  PT DURATION: 4 weeks  PLANNED INTERVENTIONS: Therapeutic exercises, Therapeutic activity, Patient/Family education, Self Care, Dry Needling, Cryotherapy, Moist heat, Ultrasound, and Manual therapy.  PLAN FOR NEXT SESSION: Dry needling, STW/M, core exercise progression, heat, e' stim.   Newman Pies, PTA 07/31/2023, 9:50 AM   PHYSICAL THERAPY DISCHARGE SUMMARY  Visits from Start of Care: 8.  Current functional level related to goals / functional outcomes: See above.   Remaining deficits: Some remaining pain in left ischial tuberosity region but goals were met.   Education / Equipment: HEP.   Patient agrees to discharge. Patient goals were met. Patient is being discharged due to meeting the stated rehab goals.     [place] MPT

## 2023-08-01 ENCOUNTER — Ambulatory Visit (INDEPENDENT_AMBULATORY_CARE_PROVIDER_SITE_OTHER): Payer: Medicare Other | Admitting: Pharmacist

## 2023-08-01 DIAGNOSIS — E1169 Type 2 diabetes mellitus with other specified complication: Secondary | ICD-10-CM | POA: Diagnosis not present

## 2023-08-01 DIAGNOSIS — G720 Drug-induced myopathy: Secondary | ICD-10-CM

## 2023-08-01 DIAGNOSIS — E785 Hyperlipidemia, unspecified: Secondary | ICD-10-CM | POA: Diagnosis not present

## 2023-08-15 ENCOUNTER — Ambulatory Visit: Payer: Medicare Other

## 2023-08-15 VITALS — Ht 64.0 in | Wt 195.0 lb

## 2023-08-15 DIAGNOSIS — Z0000 Encounter for general adult medical examination without abnormal findings: Secondary | ICD-10-CM | POA: Diagnosis not present

## 2023-08-15 NOTE — Patient Instructions (Signed)
Diana Pratt , Thank you for taking time to come for your Medicare Wellness Visit. I appreciate your ongoing commitment to your health goals. Please review the following plan we discussed and let me know if I can assist you in the future.   Referrals/Orders/Follow-Ups/Clinician Recommendations: Aim for 30 minutes of exercise or brisk walking, 6-8 glasses of water, and 5 servings of fruits and vegetables each day.   This is a list of the screening recommended for you and due dates:  Health Maintenance  Topic Date Due   COVID-19 Vaccine (1) Never done   DEXA scan (bone density measurement)  06/06/2019   Zoster (Shingles) Vaccine (1 of 2) 10/02/2023*   Flu Shot  02/03/2024*   Hemoglobin A1C  01/02/2024   Complete foot exam   01/30/2024   Yearly kidney function blood test for diabetes  07/01/2024   Yearly kidney health urinalysis for diabetes  07/01/2024   Eye exam for diabetics  07/01/2024   Medicare Annual Wellness Visit  08/14/2024   Pneumonia Vaccine  Completed   Hepatitis C Screening  Completed   HPV Vaccine  Aged Out   DTaP/Tdap/Td vaccine  Discontinued   Colon Cancer Screening  Discontinued  *Topic was postponed. The date shown is not the original due date.    Advanced directives: (Provided) Advance directive discussed with you today. I have provided a copy for you to complete at home and have notarized. Once this is complete, please bring a copy in to our office so we can scan it into your chart. Information on Advanced Care Planning can be found at [facility] Advance Health Care Directives Advance Health Care Directives ([email or web address]    Next Medicare Annual Wellness Visit scheduled for next year: Yes  Insert Preventive Care attachment Insert FALL PREVENTION attachment if needed

## 2023-08-15 NOTE — Progress Notes (Signed)
Subjective:   Diana Pratt is a 77 y.o. female who presents for Medicare Annual (Subsequent) preventive examination.  Visit Complete: Virtual I connected with  Rudi Heap on 08/15/23 by a audio enabled telemedicine application and verified that I am speaking with the correct person using two identifiers.  Patient Location: Home  Provider Location: Home Office  I discussed the limitations of evaluation and management by telemedicine. The patient expressed understanding and agreed to proceed.  Vital Signs: Because this visit was a virtual/telehealth visit, some criteria may be missing or patient reported. Any vitals not documented were not able to be obtained and vitals that have been documented are patient reported.  Patient Medicare AWV questionnaire was completed by the patient on 08/15/2023; I have confirmed that all information answered by patient is correct and no changes since this date.  Cardiac Risk Factors include: advanced age (>61men, >74 women);diabetes mellitus;dyslipidemia;hypertension;sedentary lifestyle     Objective:    Today's Vitals   08/15/23 1433  Weight: 195 lb (88.5 kg)  Height: 5\' 4"  (1.626 m)   Body mass index is 33.47 kg/m.     08/15/2023    2:37 PM 02/13/2023   10:18 AM 10/08/2022   10:04 AM 07/10/2022    4:24 PM 05/31/2022    9:29 AM 07/07/2021    2:13 PM 07/06/2020    2:02 PM  Advanced Directives  Does Patient Have a Medical Advance Directive? No No No No No No No  Would patient like information on creating a medical advance directive? Yes (MAU/Ambulatory/Procedural Areas - Information given)   No - Patient declined  No - Patient declined No - Patient declined    Current Medications (verified) Outpatient Encounter Medications as of 08/15/2023  Medication Sig   Accu-Chek Softclix Lancets lancets Check blood sugar 3 times daily Dx E11.40   albuterol (PROAIR HFA) 108 (90 Base) MCG/ACT inhaler Inhale 2 puffs into the lungs every 6 (six) hours  as needed for shortness of breath.   amLODipine (NORVASC) 10 MG tablet Take 1 tablet (10 mg total) by mouth daily.   aspirin 81 MG EC tablet Take 81 mg by mouth daily.   blood glucose meter kit and supplies Dispense based on patient and insurance preference. Use up to four times daily as directed. (FOR ICD-10 E10.9, E11.9).   Blood Glucose Monitoring Suppl (ACCU-CHEK GUIDE) w/Device KIT Use to check blood sugar up to TID and as needed. DX E11.49   budesonide-formoterol (SYMBICORT) 160-4.5 MCG/ACT inhaler Inhale 2 puffs into the lungs 2 (two) times daily.   calcium carbonate 200 MG capsule Take 250 mg by mouth daily.   Cholecalciferol (VITAMIN D3) 2000 UNITS capsule Take 2,000 Units by mouth daily.    cloNIDine (CATAPRES - DOSED IN MG/24 HR) 0.1 mg/24hr patch Place 1 patch (0.1 mg total) onto the skin once a week. For BLOOD pressure   conjugated estrogens (PREMARIN) vaginal cream APPLY ONE-HALF GRAM (ONE-FOURTH APPLICATORFUL) VAGINALLY THREE TIMES A  WEEK   Continuous Blood Gluc Receiver (FREESTYLE LIBRE READER) DEVI 1 Units by Does not apply route daily. UAD to test BGs daily. Dx E11.69   Continuous Glucose Sensor (FREESTYLE LIBRE 3 SENSOR) MISC Place 1 sensor on the skin every 14 days. Use to check glucose continuously E11.69   Evolocumab (REPATHA SURECLICK) 140 MG/ML SOAJ Inject 140 mg into the skin every 14 (fourteen) days.   FLUoxetine (PROZAC) 10 MG capsule Take 3 capsules (30 mg total) by mouth daily.   fluticasone (FLONASE) 50  MCG/ACT nasal spray Place 2 sprays into the nose daily as needed for allergies. Congestion    glucose blood (ACCU-CHEK GUIDE) test strip Use to check BG up to tid.  Dx: type 2 diabetes requiring insulin therapy E11.40   hydrochlorothiazide (HYDRODIURIL) 25 MG tablet Take 1 tablet (25 mg total) by mouth daily.   hydroxypropyl methylcellulose (ISOPTO TEARS) 2.5 % ophthalmic solution Place 1 drop into both eyes daily as needed. for dry eyes   insulin glargine (LANTUS  SOLOSTAR) 100 UNIT/ML Solostar Pen Inject 32 units in the morning and 23 units in the evening.   Insulin Pen Needle (PEN NEEDLES) 31G X 6 MM MISC Use to administer insulin once qid. Dx E11.69 Use Leader brand   LORazepam (ATIVAN) 1 MG tablet Take 1/2-1 tablet daily if needed for anxiety.  May repeat dose once if needed during the day. PUT ON FILE   metoprolol succinate (TOPROL-XL) 100 MG 24 hr tablet Take 1 tablet (100 mg total) by mouth daily. Take with or immediately following a meal.   Multiple Vitamins-Minerals (CENTRUM SILVER PO) Take 1 tablet by mouth daily.   omeprazole (PRILOSEC) 20 MG capsule Take 1 capsule (20 mg total) by mouth daily.   Semaglutide, 1 MG/DOSE, (OZEMPIC, 1 MG/DOSE,) 4 MG/3ML SOPN INJECT 1 MG SUBCUTANEOUSLY ONCE A WEEK - (ADMINISTER BY SUBCUTANEOUS INJECTION INTO THE ABDOMEN, THIGH, OR UPPER ARM AT ANY TIME OF DAY ON THE SAME DAY EACH WEEK) DISCARD PEN 56 DAYS AFTER FIRST USE   tiZANidine (ZANAFLEX) 4 MG tablet Take 0.5-1 tablets (2-4 mg total) by mouth every 8 (eight) hours as needed for muscle spasms.   valsartan (DIOVAN) 320 MG tablet Take 1 tablet (320 mg total) by mouth daily.   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 Kevin Fenton   Depression    Diabetes mellitus    Diabetic neuropathy (HCC)    GERD (gastroesophageal reflux disease)    occ heartburn   Humerus fracture    right   Hyperlipemia    Hypertension    Interstitial cystitis    Osteoporosis    Pneumonia     15 yrs ago   PONV (postoperative nausea and vomiting)    Shortness of breath dyspnea    Past Surgical History:  Procedure Laterality Date   ABDOMINAL HYSTERECTOMY  03/1984   partial   COLONOSCOPY N/A 07/19/2014   Procedure: COLONOSCOPY;  Surgeon: West Bali, MD;  Location: [facility];  Service: Endoscopy;  Laterality: N/A;  9:30   REVERSE SHOULDER ARTHROPLASTY Right 06/14/2016   Procedure: REVERSE SHOULDER ARTHROPLASTY;  Surgeon: Francena Hanly, MD;  Location: [facility] OR;  Service: Orthopedics;  Laterality: Right;   Skin cancer removed  08/07/2016   left ear   Family History  Problem Relation Age of Onset   Parkinsonism Mother    Dementia Mother    Colon cancer Father 39       MULTIPLE CO-MORBIDITIES   Cancer Father    Diabetes Paternal Aunt    Diabetes Paternal Uncle  Diabetes Paternal Grandmother    Congestive Heart Failure Paternal Grandfather    Congestive Heart Failure Maternal Grandmother    Stroke Maternal Grandfather    Colon polyps Neg Hx    Social History   Socioeconomic History   Marital status: Married    Spouse name: [name]   Number of children: 1   Years of education: 13   Highest education level: Some college, no degree  Occupational History   Occupation: retired     Comment: CNA   Tobacco Use   Smoking status: Former    Current packs/day: 0.00    Average packs/day: 0.5 packs/day for 34.0 years (17.0 ttl pk-yrs)    Types: Cigarettes    Start date: 06/01/1960    Quit date: 06/01/1994    Years since quitting: 29.2   Smokeless tobacco: Never  Vaping Use   Vaping status: Never Used  Substance and Sexual Activity   Alcohol use: No   Drug use: No   Sexual activity: Yes    Birth control/protection: Surgical    Comment: hyst  Other Topics Concern   Not on file  Social History Narrative   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[place] [employer]   Social Determinants of Health   Financial Resource Strain: Low Risk   (08/15/2023)   Overall Financial Resource Strain (CARDIA)    Difficulty of Paying Living Expenses: Not hard at all  Food Insecurity: No Food Insecurity (08/15/2023)   Hunger Vital Sign    Worried About Running Out of Food in the Last Year: Never true    Ran Out of Food in the Last Year: Never true  Transportation Needs: No Transportation Needs (08/15/2023)   PRAPARE - Administrator, Civil Service (Medical): No    Lack of Transportation (Non-Medical): No  Physical Activity: Inactive (08/15/2023)   Exercise Vital Sign    Days of Exercise per Week: 0 days    Minutes of Exercise per Session: 0 min  Stress: No Stress Concern Present (08/15/2023)   [employer] of Occupational Health - Occupational Stress Questionnaire    Feeling of Stress : Not at all  Social Connections: Moderately Isolated (08/15/2023)   Social Connection and Isolation Panel [NHANES]    Frequency of Communication with Friends and Family: More than three times a week    Frequency of Social Gatherings with Friends and Family: More than three times a week    Attends Religious Services: Never    Database administrator or Organizations: No    Attends Engineer, structural: Never    Marital Status: Married    Tobacco Counseling Counseling given: Not Answered   Clinical Intake:  Pre-visit preparation completed: Yes  Pain : No/denies pain     Nutritional Risks: None Diabetes: Yes CBG done?: No Did pt. bring in CBG monitor from home?: No  How often do you need to have someone help you when you read instructions, pamphlets, or other written materials from your doctor or pharmacy?: 1 - Never  Interpreter Needed?: No  Information entered by :: Renie Ora, LPN   Activities of Daily Living    08/15/2023    2:37 PM  In your present state of health, do you have any difficulty performing the following activities:  Hearing? 0  Vision? 0  Difficulty concentrating or making decisions? 0   Walking or climbing stairs? 0  Dressing or bathing? 0  Doing errands, shopping? 0  Preparing Food  and eating ? N  Using the Toilet? N  In the past six months, have you accidently leaked urine? N  Do you have problems with loss of bowel control? N  Managing your Medications? N  Managing your Finances? N  Housekeeping or managing your Housekeeping? N    Patient Care Team: [name], DO as PCP - General (Family Medicine) West Bali, MD (Inactive) as Consulting Physician (Gastroenterology) Cherlyn Roberts, MD as Consulting Physician (Dermatology) Francena Hanly, MD as Consulting Physician (Orthopedic Surgery) Adline Potter, NP as Nurse Practitioner (Obstetrics and Gynecology) Danella Maiers, [facility] (Pharmacist)  Indicate any recent Medical Services you may have received from other than [facility] providers in the past year (date may be approximate).     Assessment:   This is a routine wellness examination for Shirl.  Hearing/Vision screen Vision Screening - Comments:: Wears rx glasses - up to date with routine eye exams with  Dr.Le    Goals Addressed             This Visit's Progress    DIET - INCREASE WATER INTAKE   On track    Try to drink 6-8 glasses of water daily       Depression Screen    08/15/2023    2:36 PM 07/02/2023    9:02 AM 01/30/2023    8:38 AM 10/01/2022    8:43 AM 07/10/2022    4:28 PM 09/25/2021    1:39 PM 07/07/2021    2:11 PM  PHQ 2/9 Scores  PHQ - 2 Score 0 0 0 0 1 1 0  PHQ- 9 Score 0 2 2 2  3 2     Fall Risk    08/15/2023    2:34 PM 07/02/2023    9:04 AM 10/01/2022    8:41 AM 07/10/2022    4:20 PM 07/07/2021    2:25 PM  Fall Risk   Falls in the past year? 0 0 0 0   Number falls in past yr: 0 0  0 0  Injury with Fall? 0 0  0 0  Risk for fall due to : No Fall Risks No Fall Risks  Orthopedic patient Orthopedic patient;Impaired vision  Follow up Falls prevention discussed Education provided  Falls prevention discussed;Education  provided Falls prevention discussed    MEDICARE RISK AT HOME: Medicare Risk at Home Any stairs in or around the home?: Yes If so, are there any without handrails?: No Home free of loose throw rugs in walkways, pet beds, electrical cords, etc?: Yes Adequate lighting in your home to reduce risk of falls?: Yes Life alert?: No Use of a cane, walker or w/c?: No Grab bars in the bathroom?: Yes Shower chair or bench in shower?: Yes Elevated toilet seat or a handicapped toilet?: Yes  TIMED UP AND GO:  Was the test performed?  No    Cognitive Function:    05/31/2016   10:56 AM  MMSE - Mini Mental State Exam  Orientation to time 5  Orientation to Place 5  Registration 3  Attention/ Calculation 5  Recall 3  Language- name 2 objects 2  Language- repeat 1  Language- follow 3 step command 3  Language- read & follow direction 1  Write a sentence 1  Copy design 1  Total score 30        08/15/2023    2:37 PM 07/10/2022    4:24 PM 07/06/2020    2:08 PM 07/06/2019    3:32 PM  6CIT Screen  What Year? 0 points 0 points 0 points 0 points  What month? 0 points 0 points 0 points 0 points  What time? 0 points 0 points 0 points 0 points  Count back from 20 0 points 0 points 2 points 0 points  Months in reverse 0 points 0 points 0 points 0 points  Repeat phrase 0 points 0 points 0 points 2 points  Total Score 0 points 0 points 2 points 2 points    Immunizations Immunization History  Administered Date(s) Administered   DTaP 01/04/2003   Pneumococcal Conjugate-13 09/21/2019   Pneumococcal Polysaccharide-23 06/30/2018    TDAP status: Due, Education has been provided regarding the importance of this vaccine. Advised may receive this vaccine at local pharmacy or Health Dept. Aware to provide a copy of the vaccination record if obtained from local pharmacy or Health Dept. Verbalized acceptance and understanding.  Flu Vaccine status: Due, Education has been provided regarding the importance  of this vaccine. Advised may receive this vaccine at local pharmacy or Health Dept. Aware to provide a copy of the vaccination record if obtained from local pharmacy or Health Dept. Verbalized acceptance and understanding.  Pneumococcal vaccine status: Up to date  Covid-19 vaccine status: Declined, Education has been provided regarding the importance of this vaccine but patient still declined. Advised may receive this vaccine at local pharmacy or Health Dept.or vaccine clinic. Aware to provide a copy of the vaccination record if obtained from local pharmacy or Health Dept. Verbalized acceptance and understanding.  Qualifies for Shingles Vaccine? Yes   Zostavax completed No   Shingrix Completed?: No.    Education has been provided regarding the importance of this vaccine. Patient has been advised to call insurance company to determine out of pocket expense if they have not yet received this vaccine. Advised may also receive vaccine at local pharmacy or Health Dept. Verbalized acceptance and understanding.  Screening Tests Health Maintenance  Topic Date Due   COVID-19 Vaccine (1) Never done   DEXA SCAN  06/06/2019   Zoster Vaccines- Shingrix (1 of 2) 10/02/2023 (Originally 03/05/1965)   INFLUENZA VACCINE  02/03/2024 (Originally 06/06/2023)   HEMOGLOBIN A1C  01/02/2024   FOOT EXAM  01/30/2024   Diabetic kidney evaluation - eGFR measurement  07/01/2024   Diabetic kidney evaluation - Urine ACR  07/01/2024   OPHTHALMOLOGY EXAM  07/01/2024   Medicare Annual Wellness (AWV)  08/14/2024   Pneumonia Vaccine 87+ Years old  Completed   Hepatitis C Screening  Completed   HPV VACCINES  Aged Out   DTaP/Tdap/Td  Discontinued   Colonoscopy  Discontinued    Health Maintenance  Health Maintenance Due  Topic Date Due   COVID-19 Vaccine (1) Never done   DEXA SCAN  06/06/2019    Colorectal cancer screening: No longer required.   Mammogram status: No longer required due to age .  Bone Density status:  Ordered 10/01/2022. Pt provided with contact info and advised to call to schedule appt.  Lung Cancer Screening: (Low Dose CT Chest recommended if Age 23-80 years, 20 pack-year currently smoking OR have quit w/in 15years.) does not qualify.   Lung Cancer Screening Referral: n/a  Additional Screening:  Hepatitis C Screening: does not qualify; Completed 07/05/2014  Vision Screening: Recommended annual ophthalmology exams for early detection of glaucoma and other disorders of the eye. Is the patient up to date with their annual eye exam?  Yes  Who is the provider or what is the name of the  office in which the patient attends annual eye exams? Dr.Le  If pt is not established with a provider, would they like to be referred to a provider to establish care? No .   Dental Screening: Recommended annual dental exams for proper oral hygiene  Community Resource Referral / Chronic Care Management: CRR required this visit?  No   CCM required this visit?  No     Plan:     I have personally reviewed and noted the following in the patient's chart:   Medical and social history Use of alcohol, tobacco or illicit drugs  Current medications and supplements including opioid prescriptions. Patient is not currently taking opioid prescriptions. Functional ability and status Nutritional status Physical activity Advanced directives List of other physicians Hospitalizations, surgeries, and ER visits in previous 12 months Vitals Screenings to include cognitive, depression, and falls Referrals and appointments  In addition, I have reviewed and discussed with patient certain preventive protocols, quality metrics, and best practice recommendations. A written personalized care plan for preventive services as well as general preventive health recommendations were provided to patient.     Lorrene Reid, LPN   [id number]/06/6577   After Visit Summary: (MyChart) Due to this being a telephonic visit, the after  visit summary with patients personalized plan was offered to patient via MyChart   Nurse Notes: none

## 2023-08-27 ENCOUNTER — Telehealth: Payer: Self-pay | Admitting: Family Medicine

## 2023-08-27 DIAGNOSIS — Z794 Long term (current) use of insulin: Secondary | ICD-10-CM

## 2023-08-27 MED ORDER — DEXCOM G7 RECEIVER DEVI: 0 refills | Status: AC

## 2023-08-27 MED ORDER — DEXCOM G7 SENSOR MISC
3 refills | Status: DC
Start: 2023-08-27 — End: 2024-05-11

## 2023-08-27 NOTE — Telephone Encounter (Signed)
Meds ordered this encounter  Medications   Continuous Glucose Sensor (DEXCOM G7 SENSOR) MISC    Sig: Check BGs continuosly. Change every 10 days. E11.69    Dispense:  9 each    Refill:  3   Continuous Glucose Receiver (DEXCOM G7 RECEIVER) DEVI    Sig: UAD to continuously check BGs. E11.69    Dispense:  1 each    Refill:  0

## 2023-11-04 ENCOUNTER — Other Ambulatory Visit: Payer: Self-pay

## 2023-11-04 ENCOUNTER — Encounter: Payer: Self-pay | Admitting: Family Medicine

## 2023-11-04 ENCOUNTER — Telehealth: Payer: Self-pay

## 2023-11-04 ENCOUNTER — Ambulatory Visit: Payer: Medicare Other | Admitting: Family Medicine

## 2023-11-04 ENCOUNTER — Telehealth: Payer: Self-pay | Admitting: Family Medicine

## 2023-11-04 VITALS — BP 149/64 | HR 81 | Temp 98.5°F | Ht 64.0 in | Wt 201.6 lb

## 2023-11-04 DIAGNOSIS — Z79899 Other long term (current) drug therapy: Secondary | ICD-10-CM | POA: Diagnosis not present

## 2023-11-04 DIAGNOSIS — J329 Chronic sinusitis, unspecified: Secondary | ICD-10-CM

## 2023-11-04 DIAGNOSIS — F418 Other specified anxiety disorders: Secondary | ICD-10-CM | POA: Diagnosis not present

## 2023-11-04 DIAGNOSIS — E1159 Type 2 diabetes mellitus with other circulatory complications: Secondary | ICD-10-CM | POA: Diagnosis not present

## 2023-11-04 DIAGNOSIS — E1169 Type 2 diabetes mellitus with other specified complication: Secondary | ICD-10-CM | POA: Diagnosis not present

## 2023-11-04 DIAGNOSIS — E785 Hyperlipidemia, unspecified: Secondary | ICD-10-CM | POA: Diagnosis not present

## 2023-11-04 DIAGNOSIS — Z794 Long term (current) use of insulin: Secondary | ICD-10-CM

## 2023-11-04 DIAGNOSIS — I152 Hypertension secondary to endocrine disorders: Secondary | ICD-10-CM

## 2023-11-04 LAB — BAYER DCA HB A1C WAIVED: HB A1C (BAYER DCA - WAIVED): 5.8 % — ABNORMAL HIGH (ref 4.8–5.6)

## 2023-11-04 MED ORDER — LORAZEPAM 1 MG PO TABS: ORAL_TABLET | ORAL | 5 refills | Status: DC

## 2023-11-04 MED ORDER — CEFDINIR 300 MG PO CAPS: 300.0000 mg | ORAL_CAPSULE | Freq: Two times a day (BID) | ORAL | 0 refills | Status: AC

## 2023-11-04 MED ORDER — LORATADINE 10 MG PO TABS: 10.0000 mg | ORAL_TABLET | Freq: Every day | ORAL | 3 refills | Status: AC

## 2023-11-04 MED ORDER — CEFDINIR 300 MG PO CAPS: 300.0000 mg | ORAL_CAPSULE | Freq: Two times a day (BID) | ORAL | 0 refills | Status: DC

## 2023-11-04 NOTE — Telephone Encounter (Signed)
Duplicate message. 

## 2023-11-04 NOTE — Telephone Encounter (Signed)
Patient came to the office to look for RX for Cefdinir. States she was supposed to get a copy at her appt and she didn't get one. Wants medication called into [facility].

## 2023-11-04 NOTE — Telephone Encounter (Unsigned)
Copied from CRM [id number]. Topic: Clinical - Medication Question >> Nov 04, 2023  1:01 PM Carloyn Manner C wrote: Reason for CRM: Patient called in for assistance with a missing antibiotic prescription sent a message over to clinical.

## 2023-11-04 NOTE — Telephone Encounter (Signed)
Copied from CRM [id number]. Topic: Clinical - Medication Question >> Nov 04, 2023  1:01 PM Carloyn Manner C wrote: Reason for CRM: Patient called in for assistance with a missing antibiotic prescription sent a message over to clinical.

## 2023-11-04 NOTE — Telephone Encounter (Signed)
Spoke with Dr. Reece Agar and she states she was given a printed rx to give to pharmacy only if she got worse.    Spoke with patient and she is aware

## 2023-11-04 NOTE — Progress Notes (Signed)
Subjective: CC:DM PCP: Diana Ip, DO ZOX:WRUEAV [name] is a 77 y.o. female presenting to clinic today for:  1. Type 2 Diabetes with hypertension, hyperlipidemia:  Reports compliance with Dexcom, insulins, blood pressure and cholesterol medication.  No hypoglycemic episodes. Diabetes Health Maintenance Due  Topic Date Due   HEMOGLOBIN A1C  01/02/2024   FOOT EXAM  01/30/2024   OPHTHALMOLOGY EXAM  07/01/2024    Last A1c:  Lab Results  Component Value Date   HGBA1C 5.6 07/02/2023    ROS: No chest pain, shortness of breath, dizziness reported.  2.  Sinusitis She reports right sided sinus fullness.  She is been using a nasal saline, Flonase.  She reports no purulence from nares.  No fevers.  She thinks probably due to seasonal change.  No known sick contacts.  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 Kevin Fenton   Depression    Diabetes mellitus    Diabetic neuropathy (HCC)    GERD (gastroesophageal reflux disease)    occ heartburn   Humerus fracture    right   Hyperlipemia    Hypertension    Interstitial cystitis    Osteoporosis    Pneumonia    15 yrs ago   PONV (postoperative nausea and vomiting)    Shortness of breath dyspnea     Current Outpatient Medications:    Accu-Chek Softclix Lancets lancets, Check blood sugar 3 times daily Dx  E11.40, Disp: 300 each, Rfl: 3   albuterol (PROAIR HFA) 108 (90 Base) MCG/ACT inhaler, Inhale 2 puffs into the lungs every 6 (six) hours as needed for shortness of breath., Disp: 54 g, Rfl: 2   amLODipine (NORVASC) 10 MG tablet, Take 1 tablet (10 mg total) by mouth daily., Disp: 90 tablet, Rfl: 3   aspirin 81 MG EC tablet, Take 81 mg by mouth daily., Disp: , Rfl:    blood glucose meter kit and supplies, Dispense based on patient and insurance preference. Use up to four times daily as directed. (FOR ICD-10 E10.9, E11.9)., Disp: 1 each, Rfl: 0   Blood Glucose Monitoring Suppl (ACCU-CHEK GUIDE) w/Device KIT, Use to check blood sugar up to TID and as needed. DX E11.49, Disp: 1 kit, Rfl: 0   budesonide-formoterol (SYMBICORT) 160-4.5 MCG/ACT inhaler, Inhale 2 puffs into the lungs 2 (two) times daily., Disp: 3 each, Rfl: 4   calcium carbonate 200 MG capsule, Take 250 mg by mouth daily., Disp: , Rfl:    Cholecalciferol (VITAMIN D3) 2000 UNITS capsule, Take 2,000 Units by mouth daily. , Disp: 60 capsule, Rfl: 11   cloNIDine (CATAPRES - DOSED IN MG/24 HR) 0.1 mg/24hr patch, Place 1 patch (0.1 mg total) onto the skin once a week. For BLOOD pressure, Disp: 4  patch, Rfl: 12   conjugated estrogens (PREMARIN) vaginal cream, APPLY ONE-HALF GRAM (ONE-FOURTH APPLICATORFUL) VAGINALLY THREE TIMES A  WEEK, Disp: 30 g, Rfl: 3   Continuous Glucose Receiver (DEXCOM G7 RECEIVER) DEVI, UAD to continuously check BGs. E11.69, Disp: 1 each, Rfl: 0   Continuous Glucose Sensor (DEXCOM G7 SENSOR) MISC, Check BGs continuosly. Change every 10 days. E11.69, Disp: 9 each, Rfl: 3   Evolocumab (REPATHA SURECLICK) 140 MG/ML SOAJ, Inject 140 mg into the skin every 14 (fourteen) days., Disp: 6 mL, Rfl: 4   FLUoxetine (PROZAC) 10 MG capsule, Take 3 capsules (30 mg total) by mouth daily., Disp: 270 capsule, Rfl: 3   fluticasone (FLONASE) 50 MCG/ACT nasal spray, Place 2 sprays into the nose daily as needed for allergies. Congestion , Disp: ,  Rfl:    glucose blood (ACCU-CHEK GUIDE) test strip, Use to check BG up to tid.  Dx: type 2 diabetes requiring insulin therapy E11.40, Disp: 300 each, Rfl: 12   hydrochlorothiazide (HYDRODIURIL) 25 MG tablet, Take 1 tablet (25 mg total) by mouth daily., Disp: 90 tablet, Rfl: 3   hydroxypropyl methylcellulose (ISOPTO TEARS) 2.5 % ophthalmic solution, Place 1 drop into both eyes daily as needed. for dry eyes, Disp: , Rfl:    insulin glargine (LANTUS SOLOSTAR) 100 UNIT/ML Solostar Pen, Inject 32 units in the morning and 23 units in the evening., Disp: 60 mL, Rfl: 3   Insulin Pen Needle (PEN NEEDLES) 31G X 6 MM MISC, Use to administer insulin once qid. Dx E11.69 Use Leader brand, Disp: 100 each, Rfl: 3   LORazepam (ATIVAN) 1 MG tablet, Take 1/2-1 tablet daily if needed for anxiety.  May repeat dose once if needed during the day. PUT ON FILE, Disp: 45 tablet, Rfl: 5   metoprolol succinate (TOPROL-XL) 100 MG 24 hr tablet, Take 1 tablet (100 mg total) by mouth daily. Take with or immediately following a meal., Disp: 90 tablet, Rfl: 3   Multiple Vitamins-Minerals (CENTRUM SILVER PO), Take 1 tablet by mouth daily., Disp: , Rfl:    omeprazole (PRILOSEC) 20 MG capsule, Take 1 capsule (20 mg total) by mouth daily., Disp: 90 capsule, Rfl: 3   Semaglutide, 1 MG/DOSE, (OZEMPIC, 1 MG/DOSE,) 4 MG/3ML SOPN, INJECT 1 MG SUBCUTANEOUSLY ONCE A WEEK - (ADMINISTER BY SUBCUTANEOUS INJECTION INTO THE ABDOMEN, THIGH, OR UPPER ARM AT ANY TIME OF DAY ON THE SAME DAY EACH WEEK) DISCARD PEN 56 DAYS AFTER FIRST USE, Disp: 9 mL, Rfl: 3   tiZANidine (ZANAFLEX) 4 MG tablet, Take 0.5-1 tablets (2-4 mg total) by mouth every 8 (eight) hours as needed for muscle spasms., Disp: 30 tablet, Rfl: 0   valsartan (DIOVAN) 320 MG tablet, Take 1 tablet (320 mg total) by mouth daily., Disp: 90 tablet, Rfl: 3  Current Facility-Administered Medications:    methylPREDNISolone acetate (DEPO-MEDROL) injection 40 mg, 40 mg, Intramuscular, Once,  Diana Ip, DO Social History   Socioeconomic History   Marital status: Married    Spouse name: Diana Pratt   Number of children: 1   Years of education: 13   Highest education level: Some college, no degree  Occupational History   Occupation: retired     Comment: CNA   Tobacco Use   Smoking status: Former    Current packs/day: 0.00    Average packs/day: 0.5 packs/day for 34.0 years (17.0 ttl pk-yrs)    Types: Cigarettes    Start date: 06/01/1960    Quit date: 06/01/1994    Years since quitting: 29.4  Smokeless tobacco: Never  Vaping Use   Vaping status: Never Used  Substance and Sexual Activity   Alcohol use: No   Drug use: No   Sexual activity: Yes    Birth control/protection: Surgical    Comment: hyst  Other Topics Concern   Not on file  Social History Narrative   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[place] GRANDKIDS [employer]INKS SOCIALLY   Social Drivers of Corporate investment banker Strain: Low Risk  (08/15/2023)   Overall Financial Resource Strain (CARDIA)    Difficulty of Paying Living Expenses: Not hard at all  Food Insecurity: No Food Insecurity (08/15/2023)   Hunger Vital Sign    Worried About Running Out of Food in the Last Year: Never true    Ran Out of Food in the Last Year: Never true  Transportation Needs: No Transportation Needs (08/15/2023)   PRAPARE - Administrator, Civil Service (Medical): No    Lack of Transportation (Non-Medical): No  Physical Activity: Inactive (08/15/2023)   Exercise Vital Sign    Days of Exercise per Week: 0 days    Minutes of Exercise per Session: 0 min  Stress: No Stress Concern Present (08/15/2023)   [employer] of Occupational Health - Occupational Stress Questionnaire    Feeling of Stress : Not at all  Social Connections: Moderately Isolated (08/15/2023)   Social Connection and Isolation Panel [NHANES]    Frequency of Communication with Friends and Family: More than three times  a week    Frequency of Social Gatherings with Friends and Family: More than three times a week    Attends Religious Services: Never    Database administrator or Organizations: No    Attends Banker Meetings: Never    Marital Status: Married  Catering manager Violence: Not At Risk (08/15/2023)   Humiliation, Afraid, Rape, and Kick questionnaire    Fear of Current or Ex-Partner: No    Emotionally Abused: No    Physically Abused: No    Sexually Abused: No   Family History  Problem Relation Age of Onset   Parkinsonism Mother    Dementia Mother    Colon cancer Father 40       MULTIPLE CO-MORBIDITIES   Cancer Father    Diabetes Paternal Aunt    Diabetes Paternal Uncle    Diabetes Paternal Grandmother    Congestive Heart Failure Paternal Grandfather    Congestive Heart Failure Maternal Grandmother    Stroke Maternal Grandfather    Colon polyps Neg Hx     Objective: Office vital signs reviewed. BP (!) 165/69   Pulse 81   Temp 98.5 F (36.9 C)   Ht 5\' 4"  (1.626 m)   Wt 201 lb 9.6 oz (91.4 kg)   SpO2 97%   BMI 34.60 kg/m   Physical Examination:  General: Awake, alert, well nourished, No acute distress HEENT: Normal    Neck: No masses palpated. No lymphadenopathy    Ears: Tympanic membranes intact, normal light reflex, no erythema, no bulging    Eyes: PERRLA, extraocular membranes intact, sclera white    Nose: nasal turbinates moist, clear nasal discharge    Throat: moist mucus membranes, no erythema, no tonsillar exudate.  Airway is patent Cardio: regular rate and rhythm, S1S2 heard, no murmurs appreciated Pulm: clear to auscultation bilaterally, no wheezes, rhonchi or rales; normal work of breathing on room air Psych: mood stable    08/15/2023    2:36  PM 07/02/2023    9:02 AM 01/30/2023    8:38 AM  Depression screen PHQ 2/9  Decreased Interest 0 0 0  Down, Depressed, Hopeless 0 0 0  PHQ - 2 Score 0 0 0  Altered sleeping 0 1 1  Tired, decreased energy 0  1 1  Change in appetite 0 0 0  Feeling bad or failure about yourself  0 0 0  Trouble concentrating 0 0 0  Moving slowly or fidgety/restless 0 0 0  Suicidal thoughts 0 0 0  PHQ-9 Score 0 2 2  Difficult doing work/chores Not difficult at all Somewhat difficult Not difficult at all      07/02/2023    9:01 AM 01/30/2023    8:38 AM 10/01/2022    8:43 AM 09/25/2021    1:40 PM  GAD 7 : Generalized Anxiety Score  Nervous, Anxious, on Edge 1 1 1 1   Control/stop worrying 1 0 0 0  Worry too much - different things 1 0 0 0  Trouble relaxing 1 1 1 1   Restless 1 0 0 0  Easily annoyed or irritable 1 0 1 1  Afraid - awful might happen 1 0 0 0  Total GAD 7 Score 7 2 3 3   Anxiety Difficulty Not difficult at all Not difficult at all Not difficult at all Not difficult at all    Assessment/ Plan: 77 y.o. female   Type 2 diabetes mellitus with other specified complication, with long-term current use of insulin (HCC) - Plan: Bayer DCA Hb A1c Waived, Lipid Panel  Hyperlipidemia associated with type 2 diabetes mellitus (HCC) - Plan: Lipid Panel  Hypertension associated with diabetes (HCC)  Rhinosinusitis - Plan: loratadine (CLARITIN) 10 MG tablet, DISCONTINUED: cefdinir (OMNICEF) 300 MG capsule  Depression with anxiety - Plan: LORazepam (ATIVAN) 1 MG tablet  Chronic use of benzodiazepine for therapeutic purpose - Plan: LORazepam (ATIVAN) 1 MG tablet  Sugar well-controlled.  I gave her a sample of CGM.  She will continue continuous glucose monitoring  Fasting lipid collected today.  She will continue Repatha  Blood pressure not controlled.  May be secondary to use of OTCs for sinusitis which I think is viral.  I have recommended Claritin.  I have given her a pocket prescription for Omnicef given upcoming holiday.  We discussed indications for use  Ativan renewed.  UDS and CSA were updated.  National narcotic database reviewed and there were no red flags.  Postdated prescription sent to be placed  on file  Diana Pratt M Zyron Deeley, DO [facility] [phone]

## 2023-11-04 NOTE — Telephone Encounter (Signed)
Rx sent to [facility] pharmacy per patients request. Patient notified and verbalized understanding

## 2023-11-04 NOTE — Telephone Encounter (Signed)
Attempted to contact patient. VM full.

## 2023-11-05 LAB — LIPID PANEL
Chol/HDL Ratio: 6.5 {ratio} — ABNORMAL HIGH (ref 0.0–4.4)
Cholesterol, Total: 226 mg/dL — ABNORMAL HIGH (ref 100–199)
HDL: 35 mg/dL — ABNORMAL LOW (ref >39)
LDL Chol Calc (NIH): 151 mg/dL — ABNORMAL HIGH (ref 0–99)
Triglycerides: 215 mg/dL — ABNORMAL HIGH (ref 0–149)
VLDL Cholesterol Cal: 40 mg/dL (ref 5–40)

## 2023-11-07 ENCOUNTER — Ambulatory Visit: Payer: Self-pay | Admitting: Family Medicine

## 2023-11-07 NOTE — Telephone Encounter (Signed)
Handled in phone call.

## 2023-11-25 ENCOUNTER — Other Ambulatory Visit (HOSPITAL_COMMUNITY): Payer: Self-pay

## 2023-11-25 NOTE — Progress Notes (Unsigned)
11/26/2023 Name: Diana Pratt MRN: [id number] DOB: 09-12-1946  Chief Complaint  Patient presents with   Hyperlipidemia    Diana Pratt is a 78 y.o. year old female who presented for a telephone visit.  I connected with  Rudi Heap on 11/26/23 by telephone and verified that I am speaking with the correct person using two identifiers. I discussed the limitations of evaluation and management by telemedicine. The patient expressed understanding and agreed to proceed.  Patient was located in her home and PharmD in PCP office during this visit.   They were referred to the pharmacist by their PCP for assistance in managing hyperlipidemia.    Subjective:  Care Team: Primary Care Provider: [name], DO ; Next Scheduled Visit: 05/11/2024  Medication Access/Adherence  Current Pharmacy:  [facility] - [place], [place] - [address] [facility] [address] [place] [address] Phone: [phone] Fax: [phone]   Patient reports affordability concerns with their medications: No  Patient reports access/transportation concerns to their pharmacy: No  Patient reports adherence concerns with their medications:  No     Hyperlipidemia/ASCVD Risk Reduction  Patient reports she never started Repatha. Did not receive the medication from the pharmacy and was scared to start due to side effects with other cholesterol medications.  11/04/2023 lipid panel: TC 226, TG 215, HDL 35, LDL 151 (no medication)  Current lipid lowering medications: none  Medications tried in the past: Rosuvastatin (myalgias) Atorvastatin (myalgias) Pitvastatin (myalgias) Simvastatin (myalgias) Ezetimibe (dry cough)  Antiplatelet regimen: aspirin 81 mg daily  ASCVD History: None, aortoiliac atherosclerosis noted on imaging in 2019 Family History: maternal grandmother (CHF), maternal grandfather (stroke), paternal grandfather (CHF)  Risk Factors: HTN, T2DM, former  smoker  Current medication access support: gets her medications from the [place] for free  The 10-year ASCVD risk score (Arnett DK, et al., 2019) is: 52.6%   Values used to calculate the score:     Age: 24 years     Sex: Female     Is Non-Hispanic African American: No     Diabetic: Yes     Tobacco smoker: No     Systolic Blood Pressure: 149 mmHg     Is BP treated: Yes     HDL Cholesterol: 35 mg/dL     Total Cholesterol: 226 mg/dL   PREVENT Risk Score: 10 year risk of CVD: 11.8% - 10 year risk of ASCVD: 8.7% - 10 year risk of HF: 7.4%   Objective:  Lab Results  Component Value Date   HGBA1C 5.8 (H) 11/04/2023    Lab Results  Component Value Date   CREATININE 0.93 07/02/2023   BUN 20 07/02/2023   NA 142 07/02/2023   K 4.1 07/02/2023   CL 104 07/02/2023   CO2 20 07/02/2023    Lab Results  Component Value Date   CHOL 226 (H) 11/04/2023   HDL 35 (L) 11/04/2023   LDLCALC 151 (H) 11/04/2023   LDLDIRECT 136 (H) 09/25/2021   TRIG 215 (H) 11/04/2023   CHOLHDL 6.5 (H) 11/04/2023    Medications Reviewed Today     Reviewed by Vela Prose, RPH (Pharmacist) on 11/26/23 at 1549  Med List Status: <None>   Medication Order Taking? Sig Documenting Provider Last Dose Status Informant  Accu-Chek Softclix Lancets lancets [id number]  Check blood sugar 3 times daily Dx E11.40 [name], DO  Active   albuterol [facility] HFA) 108 (90 Base) MCG/ACT inhaler [id number]  Inhale 2 puffs  into the lungs every 6 (six) hours as needed for shortness of breath. Delynn Flavin M, DO  Active   amLODipine (NORVASC) 10 MG tablet [id number]  Take 1 tablet (10 mg total) by mouth daily. Delynn Flavin M, DO  Active   aspirin 81 MG EC tablet [id number]  Take 81 mg by mouth daily. [provider]  Active Self  blood glucose meter kit and supplies [id number]  Dispense based on patient and insurance preference. Use up to four times daily as directed. (FOR ICD-10 E10.9, E11.9). Delynn Flavin M, DO  Active   Blood Glucose Monitoring Suppl (ACCU-CHEK GUIDE) w/Device KIT [id number]  Use to check blood sugar up to TID and as needed. DX E11.49 Delynn Flavin M, DO  Active   budesonide-formoterol [facility]) 160-4.5 MCG/ACT inhaler [id number]  Inhale 2 puffs into the lungs 2 (two) times daily. Delynn Flavin M, DO  Active   calcium carbonate 200 MG capsule [id number]  Take 250 mg by mouth daily. [provider]  Active Self  Cholecalciferol (VITAMIN D3) 2000 UNITS capsule [id number]  Take 2,000 Units by mouth daily.  Ileana Ladd, MD  Active Self  cloNIDine (CATAPRES - DOSED IN MG/24 HR) 0.1 mg/24hr patch [id number]  Place 1 patch (0.1 mg total) onto the skin once a week. For BLOOD pressure Delynn Flavin M, DO  Active            Med Note Patricia Nettle, Shearon Balo   Thu Aug 01, 2023  1:18 PM) Has not started because she wants to stop an oral medication first  conjugated estrogens (PREMARIN) vaginal cream [id number]  APPLY ONE-HALF GRAM (ONE-FOURTH APPLICATORFUL) VAGINALLY THREE TIMES A  WEEK Gottschalk, Ashly M, DO  Active   Continuous Glucose Receiver (DEXCOM G7 RECEIVER) DEVI [id number]  UAD to continuously check BGs. E11.69 [name], DO  Active   Continuous Glucose Sensor (DEXCOM G7 SENSOR) MISC [id number]  Check BGs continuosly. Change every 10 days. E11.69 Delynn Flavin M, DO  Active   Evolocumab (REPATHA SURECLICK) 140 MG/ML Ivory Broad [id number] No Inject 140 mg into the skin every 14 (fourteen) days.  Patient not taking: Reported on 11/26/2023   [name], DO Not Taking Active   FLUoxetine (PROZAC) 10 MG capsule [id number]  Take 3 capsules (30 mg total) by mouth daily. Delynn Flavin M, DO  Active   fluticasone (FLONASE) 50 MCG/ACT nasal spray [id number]  Place 2 sprays into the nose daily as needed for allergies. Congestion  [provider]  Active Self  glucose blood (ACCU-CHEK GUIDE) test strip [id number]  Use to check BG up to tid.  Dx: type 2  diabetes requiring insulin therapy E11.40 Delynn Flavin M, DO  Active   hydrochlorothiazide (HYDRODIURIL) 25 MG tablet [id number]  Take 1 tablet (25 mg total) by mouth daily. Delynn Flavin M, DO  Active   hydroxypropyl methylcellulose (ISOPTO TEARS) 2.5 % ophthalmic solution [id number]  Place 1 drop into both eyes daily as needed. for dry eyes [provider]  Active Self  insulin glargine (LANTUS SOLOSTAR) 100 UNIT/ML Solostar Pen [id number]  Inject 32 units in the morning and 23 units in the evening. Delynn Flavin M, DO  Active   Insulin Pen Needle (PEN NEEDLES) 31G X 6 MM MISC [id number]  Use to administer insulin once qid. Dx E11.69 Use Leader brand Delynn Flavin M, DO  Active   loratadine (CLARITIN) 10 MG tablet [id number]  Take 1 tablet (10 mg total) by mouth daily. For allergies/ drainage Nadine Counts,  Rozell Searing, DO  Active   LORazepam (ATIVAN) 1 MG tablet [id number]  Take 1/2-1 tablet daily if needed for anxiety.  May repeat dose once if needed during the day. PUT ON FILE Delynn Flavin M, DO  Active   methylPREDNISolone acetate (DEPO-MEDROL) injection 40 mg [id number]   Delynn Flavin M, DO  Active   metoprolol succinate (TOPROL-XL) 100 MG 24 hr tablet [id number]  Take 1 tablet (100 mg total) by mouth daily. Take with or immediately following a meal. Delynn Flavin M, DO  Active   Multiple Vitamins-Minerals (CENTRUM SILVER PO) [id number]  Take 1 tablet by mouth daily. [provider]  Active Self  omeprazole (PRILOSEC) 20 MG capsule [id number]  Take 1 capsule (20 mg total) by mouth daily. Delynn Flavin M, DO  Active   Semaglutide, 1 MG/DOSE, (OZEMPIC, 1 MG/DOSE,) 4 MG/3ML SOPN [id number]  INJECT 1 MG SUBCUTANEOUSLY ONCE A WEEK - (ADMINISTER BY SUBCUTANEOUS INJECTION INTO THE ABDOMEN, THIGH, OR UPPER ARM AT ANY TIME OF DAY ON THE SAME DAY EACH WEEK) DISCARD PEN 56 DAYS AFTER FIRST USE Delynn Flavin M, DO  Active   tiZANidine (ZANAFLEX) 4 MG tablet [id number]  Take  0.5-1 tablets (2-4 mg total) by mouth every 8 (eight) hours as needed for muscle spasms. Delynn Flavin M, DO  Active   valsartan (DIOVAN) 320 MG tablet [id number]  Take 1 tablet (320 mg total) by mouth daily. Delynn Flavin M, DO  Active             Assessment/Plan:   Hyperlipidemia/ASCVD Risk Reduction: - Currently uncontrolled based on LDL 151 mg/dl, above goal <87 mg/dl given diagnosis of F6EP. Patient previously reported she was taking Repatha, but today told me she has never been on the medication because she did not get it from the pharmacy and was scared to start it due to myalgias with statins. This correlates with LDL ~150 for the past year. Suspect there may be some confusion with medications given her complicated regimen. She has a history of LDL as high as 206 mg/dl in the past indicating primary hyperlipidemia. Also with ASCVD risk score of 52.6%, diagnosis of T2DM, and aortoiliac atherosclerosis noted on imaging she is at high risk of MI and stroke.  - Recommend to start Repatha 140 mg every 14 days. Will submit for insurance authorization through [facility].   Follow Up Plan: PharmD on 12/17/2023 and PCP on 05/11/2024  Jarrett Ables, PharmD PGY-1 Pharmacy Resident  Kieth Brightly, PharmD, BCACP, CPP Clinical Pharmacist, [facility]

## 2023-11-26 ENCOUNTER — Telehealth: Payer: Self-pay | Admitting: Pharmacist

## 2023-11-26 ENCOUNTER — Other Ambulatory Visit (HOSPITAL_COMMUNITY): Payer: Self-pay

## 2023-11-26 ENCOUNTER — Other Ambulatory Visit (INDEPENDENT_AMBULATORY_CARE_PROVIDER_SITE_OTHER): Payer: Medicare Other | Admitting: Pharmacist

## 2023-11-26 DIAGNOSIS — E1169 Type 2 diabetes mellitus with other specified complication: Secondary | ICD-10-CM

## 2023-11-26 DIAGNOSIS — G720 Drug-induced myopathy: Secondary | ICD-10-CM

## 2023-11-26 DIAGNOSIS — Z7985 Long-term (current) use of injectable non-insulin antidiabetic drugs: Secondary | ICD-10-CM

## 2023-11-26 DIAGNOSIS — E785 Hyperlipidemia, unspecified: Secondary | ICD-10-CM

## 2023-11-26 MED ORDER — REPATHA SURECLICK 140 MG/ML ~~LOC~~ SOAJ: 140.0000 mg | SUBCUTANEOUS | 4 refills | Status: DC

## 2023-11-26 NOTE — Telephone Encounter (Signed)
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

## 2023-11-27 ENCOUNTER — Telehealth: Payer: Self-pay

## 2023-11-27 ENCOUNTER — Other Ambulatory Visit (HOSPITAL_COMMUNITY): Payer: Self-pay

## 2023-11-27 NOTE — Telephone Encounter (Signed)
Pharmacy Patient Advocate Encounter   Received notification from Physician's Office that prior authorization for Repathais required/requested.   Insurance verification completed.   The patient is insured through [facility] .   Place a call to [phone], per the representative, she stated they do not do prior authorizations and the prescription would need to be sent in to them.  She checked and the order was there and she said since the patient is a beneficiary of the veteran, the medication should have a $0 copay.

## 2023-11-27 NOTE — Telephone Encounter (Signed)
PA request has been Started. New Encounter created for follow up. For additional info see Pharmacy Prior Auth telephone encounter from 11/27/23.

## 2023-12-17 ENCOUNTER — Other Ambulatory Visit (INDEPENDENT_AMBULATORY_CARE_PROVIDER_SITE_OTHER): Payer: Medicare Other | Admitting: Pharmacist

## 2023-12-17 DIAGNOSIS — E1169 Type 2 diabetes mellitus with other specified complication: Secondary | ICD-10-CM

## 2023-12-17 DIAGNOSIS — E785 Hyperlipidemia, unspecified: Secondary | ICD-10-CM

## 2023-12-17 DIAGNOSIS — Z7985 Long-term (current) use of injectable non-insulin antidiabetic drugs: Secondary | ICD-10-CM

## 2023-12-17 NOTE — Progress Notes (Signed)
12/17/2023 Name: Diana Pratt MRN: [id number] DOB: Mar 20, 1946  Chief Complaint  Patient presents with   Hyperlipidemia    Diana Pratt is a 78 y.o. year old female who presented for a telephone visit.  I connected with  Rudi Heap on 12/17/23 by telephone and verified that I am speaking with the correct person using two identifiers.  I discussed the limitations of evaluation and management by telemedicine. The patient expressed understanding and agreed to proceed.  Patient was located in her home and PharmD in PCP office during this visit.   They were referred to the pharmacist by their PCP for assistance in managing hyperlipidemia.   Subjective:  Care Team: Primary Care Provider: [name], DO ; Next Scheduled Visit: 05/11/2024  Medication Access/Adherence  Current Pharmacy:  [facility] - [place], [place] - [address] [facility] [address] [place] [address] Phone: [phone] Fax: [phone]   Patient reports affordability concerns with their medications: No ---fills at [facility] [place] Patient reports access/transportation concerns to their pharmacy: No  Patient reports adherence concerns with their medications:  No     Hyperlipidemia/ASCVD Risk Reduction  Current lipid lowering medications: Repatha 140 mg every 2 weeks  Patient reports she received Repatha yesterday and gave her first injection. Tolerating well so far with no side effects.  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 Family History: maternal grandmother (CHF), maternal grandfather (stroke), paternal grandfather (CHF)  Risk Factors: HTN, T2DM, former  smoker  Current medication access support: gets medications from the [place] for free  The 10-year ASCVD risk score (Arnett DK, et al., 2019) is: 52.6%   Values used to calculate the score:     Age: 71 years     Sex: Female     Is Non-Hispanic African American: No     Diabetic: Yes     Tobacco smoker: No     Systolic Blood Pressure: 149 mmHg     Is BP treated: Yes     HDL Cholesterol: 35 mg/dL     Total Cholesterol: 226 mg/dL   PREVENT Risk Score: 10 year risk of CVD: 11.8% - 10 year risk of ASCVD: 8.7% - 10 year risk of HF: 7.4%   Objective:  Lab Results  Component Value Date   HGBA1C 5.8 (H) 11/04/2023    Lab Results  Component Value Date   CREATININE 0.93 07/02/2023   BUN 20 07/02/2023   NA 142 07/02/2023   K 4.1 07/02/2023   CL 104 07/02/2023   CO2 20 07/02/2023    Lab Results  Component Value Date   CHOL 226 (H) 11/04/2023   HDL 35 (L) 11/04/2023   LDLCALC 151 (H) 11/04/2023   LDLDIRECT 136 (H) 09/25/2021   TRIG 215 (H) 11/04/2023   CHOLHDL 6.5 (H) 11/04/2023    Medications Reviewed Today     Reviewed by Vela Prose, RPH (Pharmacist) on 12/17/23 at 1113  Med List Status: <None>   Medication Order Taking? Sig Documenting Provider Last Dose Status Informant  Accu-Chek Softclix Lancets lancets [id number]  Check blood sugar 3 times daily Dx E11.40 [name], DO  Active   albuterol [facility] HFA) 108 (90 Base) MCG/ACT inhaler [id number]  Inhale 2  puffs into the lungs every 6 (six) hours as needed for shortness of breath. Delynn Flavin M, DO  Active   amLODipine (NORVASC) 10 MG tablet [id number]  Take 1 tablet (10 mg total) by mouth daily. Delynn Flavin M, DO  Active   aspirin 81 MG EC tablet [id number]  Take 81 mg by mouth daily. [provider]  Active Self  blood glucose meter kit and supplies [id number]  Dispense based on patient and insurance preference. Use up to four times daily as directed. (FOR ICD-10 E10.9, E11.9). Delynn Flavin M, DO  Active   Blood Glucose Monitoring Suppl (ACCU-CHEK GUIDE) w/Device KIT [id number]  Use to check blood sugar up to TID and as needed. DX E11.49 Delynn Flavin M, DO  Active   budesonide-formoterol [facility]) 160-4.5 MCG/ACT inhaler [id number]  Inhale 2 puffs into the lungs 2 (two) times daily. Delynn Flavin M, DO  Active   calcium carbonate 200 MG capsule [id number]  Take 250 mg by mouth daily. [provider]  Active Self  Cholecalciferol (VITAMIN D3) 2000 UNITS capsule [id number]  Take 2,000 Units by mouth daily.  Ileana Ladd, MD  Active Self  cloNIDine (CATAPRES - DOSED IN MG/24 HR) 0.1 mg/24hr patch [id number]  Place 1 patch (0.1 mg total) onto the skin once a week. For BLOOD pressure Delynn Flavin M, DO  Active            Med Note Patricia Nettle, Shearon Balo   Thu Aug 01, 2023  1:18 PM) Has not started because she wants to stop an oral medication first  conjugated estrogens (PREMARIN) vaginal cream [id number]  APPLY ONE-HALF GRAM (ONE-FOURTH APPLICATORFUL) VAGINALLY THREE TIMES A  WEEK Gottschalk, Ashly M, DO  Active   Continuous Glucose Receiver (DEXCOM G7 RECEIVER) DEVI [id number]  UAD to continuously check BGs. E11.69 [name], DO  Active   Continuous Glucose Sensor (DEXCOM G7 SENSOR) MISC [id number]  Check BGs continuosly. Change every 10 days. E11.69 [name], DO  Active   Evolocumab [facility] SURECLICK) 140 MG/ML Ivory Broad [id number] Yes Inject 140 mg into the skin every 14 (fourteen) days. Delynn Flavin M, DO Taking Active   FLUoxetine (PROZAC) 10 MG capsule [id number]  Take 3 capsules (30 mg total) by mouth daily. Delynn Flavin M, DO  Active   fluticasone (FLONASE) 50 MCG/ACT nasal spray [id number]  Place 2 sprays into the nose daily as needed for allergies. Congestion  [provider]  Active Self  glucose blood (ACCU-CHEK GUIDE) test strip [id number]  Use to check BG up to tid.  Dx: type 2 diabetes requiring insulin therapy E11.40  Delynn Flavin M, DO  Active   hydrochlorothiazide (HYDRODIURIL) 25 MG tablet [id number]  Take 1 tablet (25 mg total) by mouth daily. Delynn Flavin M, DO  Active   hydroxypropyl methylcellulose (ISOPTO TEARS) 2.5 % ophthalmic solution [id number]  Place 1 drop into both eyes daily as needed. for dry eyes [provider]  Active Self  insulin glargine (LANTUS SOLOSTAR) 100 UNIT/ML Solostar Pen [id number]  Inject 32 units in the morning and 23 units in the evening. Delynn Flavin M, DO  Active   Insulin Pen Needle (PEN NEEDLES) 31G X 6 MM MISC [id number]  Use to administer insulin once qid. Dx E11.69 Use Leader brand Delynn Flavin M, DO  Active   loratadine (CLARITIN) 10 MG tablet [id number]  Take 1 tablet (10 mg total) by mouth daily. For allergies/ drainage Delynn Flavin M, DO  Active   LORazepam (ATIVAN)  1 MG tablet [id number]  Take 1/2-1 tablet daily if needed for anxiety.  May repeat dose once if needed during the day. PUT ON FILE Delynn Flavin M, DO  Active   methylPREDNISolone acetate (DEPO-MEDROL) injection 40 mg [id number]   Delynn Flavin M, DO  Active   metoprolol succinate (TOPROL-XL) 100 MG 24 hr tablet [id number]  Take 1 tablet (100 mg total) by mouth daily. Take with or immediately following a meal. Delynn Flavin M, DO  Active   Multiple Vitamins-Minerals (CENTRUM SILVER PO) [id number]  Take 1 tablet by mouth daily. [provider]  Active Self  omeprazole (PRILOSEC) 20 MG capsule [id number]  Take 1 capsule (20 mg total) by mouth daily. Delynn Flavin M, DO  Active   Semaglutide, 1 MG/DOSE, (OZEMPIC, 1 MG/DOSE,) 4 MG/3ML SOPN [id number]  INJECT 1 MG SUBCUTANEOUSLY ONCE A WEEK - (ADMINISTER BY SUBCUTANEOUS INJECTION INTO THE ABDOMEN, THIGH, OR UPPER ARM AT ANY TIME OF DAY ON THE SAME DAY EACH WEEK) DISCARD PEN 56 DAYS AFTER FIRST USE Delynn Flavin M, DO  Active   tiZANidine (ZANAFLEX) 4 MG tablet [id number]  Take 0.5-1 tablets (2-4 mg total) by mouth  every 8 (eight) hours as needed for muscle spasms. Delynn Flavin M, DO  Active   valsartan (DIOVAN) 320 MG tablet [id number]  Take 1 tablet (320 mg total) by mouth daily. Delynn Flavin M, DO  Active             Assessment/Plan:   Hyperlipidemia/ASCVD Risk Reduction: - Currently uncontrolled based on LDL 151 mg/dl, above goal <87 mg/dl given diagnosis of F6EP. Patient has now received Repatha for $0 copay and gave her first injection yesterday. Tolerating well so far. Counseled patient to make sure she sets a reminder to give her injection every 2 weeks.  - Reviewed long term complications of uncontrolled cholesterol - Recommend to continue Repatha 140 mg every 2 weeks - F/u lipid panel due in 8-12 weeks. Order placed.   Follow Up Plan: PharmD on 03/17/24, PCP on 05/11/2024  Jarrett Ables, PharmD PGY-1 Pharmacy Resident   Kieth Brightly, PharmD, BCACP, CPP Clinical Pharmacist, [facility]

## 2024-03-11 ENCOUNTER — Other Ambulatory Visit: Payer: Medicare Other

## 2024-03-11 DIAGNOSIS — E1169 Type 2 diabetes mellitus with other specified complication: Secondary | ICD-10-CM | POA: Diagnosis not present

## 2024-03-11 DIAGNOSIS — E785 Hyperlipidemia, unspecified: Secondary | ICD-10-CM | POA: Diagnosis not present

## 2024-03-11 LAB — LIPID PANEL

## 2024-03-12 LAB — LIPID PANEL
Cholesterol, Total: 123 mg/dL (ref 100–199)
HDL: 39 mg/dL — ABNORMAL LOW (ref >39)
LDL CALC COMMENT:: 3.2 ratio (ref 0.0–4.4)
LDL Chol Calc (NIH): 58 mg/dL (ref 0–99)
Triglycerides: 148 mg/dL (ref 0–149)
VLDL Cholesterol Cal: 26 mg/dL (ref 5–40)

## 2024-03-17 ENCOUNTER — Other Ambulatory Visit (INDEPENDENT_AMBULATORY_CARE_PROVIDER_SITE_OTHER): Payer: Medicare Other | Admitting: Pharmacist

## 2024-03-17 DIAGNOSIS — Z7985 Long-term (current) use of injectable non-insulin antidiabetic drugs: Secondary | ICD-10-CM

## 2024-03-17 DIAGNOSIS — G720 Drug-induced myopathy: Secondary | ICD-10-CM

## 2024-03-17 DIAGNOSIS — E785 Hyperlipidemia, unspecified: Secondary | ICD-10-CM

## 2024-03-17 DIAGNOSIS — E1169 Type 2 diabetes mellitus with other specified complication: Secondary | ICD-10-CM

## 2024-03-17 DIAGNOSIS — E1159 Type 2 diabetes mellitus with other circulatory complications: Secondary | ICD-10-CM

## 2024-03-17 DIAGNOSIS — Z794 Long term (current) use of insulin: Secondary | ICD-10-CM

## 2024-03-17 DIAGNOSIS — I152 Hypertension secondary to endocrine disorders: Secondary | ICD-10-CM

## 2024-03-17 NOTE — Progress Notes (Cosign Needed)
 03/17/2024 Name: Diana Pratt MRN: [id number] DOB: 26-Nov-1945  Chief Complaint  Patient presents with   Hypertension   Diabetes   Hyperlipidemia    Diana Pratt is a 78 y.o. year old female who presented for a telephone visit.   They were referred to the pharmacist by their PCP for assistance in managing hyperlipidemia.    Subjective:  Care Team: Primary Care Provider: Eliodoro Guerin, DO ; Next Scheduled Visit: 05/11/24  Medication Access/Adherence  Current Pharmacy:  [facility] - [place], [place] - [address] [facility] [address] [place] [address] Phone: [phone] Fax: [phone]   Patient reports affordability concerns with their medications: No - gets medications from [facility] for free Patient reports access/transportation concerns to their pharmacy: No  Patient reports adherence concerns with their medications:  No    Current medications: Lantus  45 units in AM and 16 units in PM, Ozempic  1 mg  Using Dexcom G7, unable to pull up report on her phone today but notes BG was 80 this morning and generally has been 90s-100s. Reports she was having some hypoglycemia in the mornings so she recently decreased her evening dose of Lantus  and this has now resolved.  Patient denies hypoglycemic s/sx including dizziness, shakiness, sweating. Patient denies hyperglycemic symptoms including polyuria, polydipsia, polyphagia, nocturia, neuropathy, blurred vision.   Hyperlipidemia/ASCVD Risk Reduction  Current lipid lowering medications: Repatha   Medications tried in the past: Rosuvastatin  (myalgias) Atorvastatin  (myalgias) Pitvastatin (myalgias) Simvastatin (myalgias) Ezetimibe (dry cough)  Antiplatelet regimen: aspirin  81 mg daily   ASCVD History: none, aortoiliac atherosclerosis noted on imaging in 2019 Family History: maternal grandmother (CHF), maternal grandfather (stroke), paternal grandfather (CHF)  Risk Factors: HTN, DM, former  smoker  Current physical activity: limited by hip pain   Hypertension:  Current medications: amlodipine  10 mg daily, hydrochlorothiazide  25 mg daily, metoprolol  succinate 100 mg daily, valsartan  320 mg daily, clonidine  0.1 mg patch (not taking)  Patient reports she tried the clonidine  patch once, but did not want to use this as she is already on many other medications.  Patient has a validated, automated, upper arm home BP cuff Current blood pressure readings readings: 140s/60s  Patient denies hypotensive s/sx including dizziness, lightheadedness.  Patient denies hypertensive symptoms including headache, chest pain, shortness of breath  Current physical activity: limited by hip pain   Objective:  Lab Results  Component Value Date   HGBA1C 5.8 (H) 11/04/2023    Lab Results  Component Value Date   CREATININE 0.93 07/02/2023   BUN 20 07/02/2023   NA 142 07/02/2023   K 4.1 07/02/2023   CL 104 07/02/2023   CO2 20 07/02/2023    Lab Results  Component Value Date   CHOL 123 03/11/2024   HDL 39 (L) 03/11/2024   LDLCALC 58 03/11/2024   LDLDIRECT 136 (H) 09/25/2021   TRIG 148 03/11/2024   CHOLHDL 3.2 03/11/2024    Medications Reviewed Today     Reviewed by Philmore Bream, RPH (Pharmacist) on 03/17/24 at 0935  Med List Status: <None>   Medication Order Taking? Sig Documenting Provider Last Dose Status Informant  Accu-Chek Softclix Lancets lancets [id number]  Check blood sugar 3 times daily Dx E11.40 Vicky Grange M, DO  Active   albuterol  (PROAIR  HFA) 108 (90 Base) MCG/ACT inhaler [id number]  Inhale 2 puffs into the lungs every 6 (six) hours as needed for shortness of breath. Vicky Grange M, DO  Active   amLODipine  (NORVASC ) 10 MG tablet  [id number] Yes Take 1 tablet (10 mg total) by mouth daily. Vicky Grange M, DO Taking Active   aspirin  81 MG EC tablet [id number]  Take 81 mg by mouth daily. [provider]  Active Self  blood glucose meter kit and  supplies [id number]  Dispense based on patient and insurance preference. Use up to four times daily as directed. (FOR ICD-10 E10.9, E11.9). Vicky Grange M, DO  Active   Blood Glucose Monitoring Suppl (ACCU-CHEK GUIDE) w/Device KIT [id number]  Use to check blood sugar up to TID and as needed. DX E11.49 Vicky Grange M, DO  Active   budesonide -formoterol  (SYMBICORT ) 160-4.5 MCG/ACT inhaler [id number]  Inhale 2 puffs into the lungs 2 (two) times daily. Vicky Grange M, DO  Active   calcium  carbonate 200 MG capsule [id number]  Take 250 mg by mouth daily. [provider]  Active Self  Cholecalciferol (VITAMIN D3) 2000 UNITS capsule [id number]  Take 2,000 Units by mouth daily.  Suzzette Eth, MD  Active Self  cloNIDine  (CATAPRES  - DOSED IN MG/24 HR) 0.1 mg/24hr patch [id number] No Place 1 patch (0.1 mg total) onto the skin once a week. For BLOOD pressure  Patient not taking: Reported on 03/17/2024   Eliodoro Guerin, DO Not Taking Active            Med Note Donivan Furry Aug 01, 2023  1:18 PM) Has not started because she wants to stop an oral medication first  conjugated estrogens  (PREMARIN ) vaginal cream [id number]  APPLY ONE-HALF GRAM (ONE-FOURTH APPLICATORFUL) VAGINALLY THREE TIMES A  WEEK Gottschalk, Ashly M, DO  Active   Continuous Glucose Receiver (DEXCOM G7 RECEIVER) DEVI [id number]  UAD to continuously check BGs. E11.69 Eliodoro Guerin, DO  Active   Continuous Glucose Sensor (DEXCOM G7 SENSOR) MISC [id number]  Check BGs continuosly. Change every 10 days. E11.69 Eliodoro Guerin, DO  Active   Evolocumab  (REPATHA  SURECLICK) 140 MG/ML Stevens Eland [id number] Yes Inject 140 mg into the skin every 14 (fourteen) days. Vicky Grange M, DO Taking Active   FLUoxetine  (PROZAC ) 10 MG capsule [id number]  Take 3 capsules (30 mg total) by mouth daily. Vicky Grange M, DO  Active   fluticasone (FLONASE) 50 MCG/ACT nasal spray [id number]  Place 2 sprays into the nose daily as needed  for allergies. Congestion  [provider]  Active Self  glucose blood (ACCU-CHEK GUIDE) test strip [id number]  Use to check BG up to tid.  Dx: type 2 diabetes requiring insulin  therapy E11.40 Vicky Grange M, DO  Active   hydrochlorothiazide  (HYDRODIURIL ) 25 MG tablet [id number] Yes Take 1 tablet (25 mg total) by mouth daily. Vicky Grange M, DO Taking Active   hydroxypropyl methylcellulose (ISOPTO TEARS) 2.5 % ophthalmic solution [id number]  Place 1 drop into both eyes daily as needed. for dry eyes [provider]  Active Self  insulin  glargine (LANTUS  SOLOSTAR) 100 UNIT/ML Solostar Pen [id number] Yes Inject 32 units in the morning and 23 units in the evening. Eliodoro Guerin, DO Taking Active            Med Note Holly Lush, KATIE R   Tue Mar 17, 2024  9:35 AM) Taking 45 units in the morning and 16 units in the evening  Insulin  Pen Needle (PEN NEEDLES) 31G X 6 MM MISC [id number]  Use to administer insulin  once qid. Dx E11.69 Use Leader brand Eliodoro Guerin, DO  Active   loratadine  (CLARITIN ) 10 MG tablet [id number]  Take 1 tablet (  10 mg total) by mouth daily. For allergies/ drainage Vicky Grange M, DO  Active   LORazepam  (ATIVAN ) 1 MG tablet [id number]  Take 1/2-1 tablet daily if needed for anxiety.  May repeat dose once if needed during the day. PUT ON FILE Vicky Grange M, DO  Active   methylPREDNISolone  acetate (DEPO-MEDROL ) injection 40 mg [id number]   Vicky Grange M, DO  Active   metoprolol  succinate (TOPROL -XL) 100 MG 24 hr tablet [id number] Yes Take 1 tablet (100 mg total) by mouth daily. Take with or immediately following a meal. Eliodoro Guerin, DO Taking Active   Multiple Vitamins-Minerals (CENTRUM SILVER PO) [id number]  Take 1 tablet by mouth daily. [provider]  Active Self  omeprazole  (PRILOSEC) 20 MG capsule [id number]  Take 1 capsule (20 mg total) by mouth daily. Vicky Grange M, DO  Active   Semaglutide , 1 MG/DOSE, (OZEMPIC , 1  MG/DOSE,) 4 MG/3ML SOPN [id number] Yes INJECT 1 MG SUBCUTANEOUSLY ONCE A WEEK - (ADMINISTER BY SUBCUTANEOUS INJECTION INTO THE ABDOMEN, THIGH, OR UPPER ARM AT ANY TIME OF DAY ON THE SAME DAY EACH WEEK) DISCARD PEN 56 DAYS AFTER FIRST USE Vicky Grange M, DO Taking Active   tiZANidine  (ZANAFLEX ) 4 MG tablet [id number]  Take 0.5-1 tablets (2-4 mg total) by mouth every 8 (eight) hours as needed for muscle spasms. Vicky Grange M, DO  Active   valsartan  (DIOVAN ) 320 MG tablet [id number] Yes Take 1 tablet (320 mg total) by mouth daily. Vicky Grange M, DO Taking Active               Assessment/Plan:   Diabetes: - Currently controlled with last A1C 5.8% on 11/04/23 - Reviewed long term cardiovascular and renal outcomes of uncontrolled blood sugar - Reviewed goal A1c, goal fasting, and goal 2 hour post prandial glucose - Recommend to continue Lantus  45 units in AM and 16 units in PM - Recommend to continue Ozempic  1 mg weekly  - Patient denies personal or family history of multiple endocrine neoplasia type 2, medullary thyroid  cancer; personal history of pancreatitis or gallbladder disease. - Recommend to check glucose continuously with Dexcom G7 - A1C due at next PCP visit  Hypertension: - Currently uncontrolled with home BP readings above goal <130/80 and last office visit reading elevated at 149/64 - Reviewed long term cardiovascular and renal outcomes of uncontrolled blood pressure - Reviewed appropriate blood pressure monitoring technique and reviewed goal blood pressure. Recommended to check home blood pressure and heart rate. - Recommend to continue amlodipine  10 mg daily, hydrochlorothiazide  25 mg daily, metoprolol  succinate 100 mg daily, valsartan  320 mg daily. Removed clonidine  patches from her med list as she is not taking this and prefers not to take this. - Recommended patient follow up in person in pharmacy clinic in 1-2 weeks for BP check. Patient preferred to follow up  with PCP in July for this. Discussed that if BP remains elevated would recommend addition of spironolactone for resistant hypertension pending updated BMET. Last eGFR 63 and K 4.1 in August 2024.     Hyperlipidemia/ASCVD Risk Reduction: - Currently controlled with last LDL 58 mg/dL now at goal <74 mg/dL with addition of Repatha . TG also improved to 148 mg/dL (down from 259 mg/dL). HDL is low at 39 mg/dL. - Reviewed long term complications of uncontrolled cholesterol - Reviewed diet and lifestyle recommendations including increasing physical activity as tolerated (limited by hip pain) and increasing intake of healthy fats (fish, nuts, seeds). - Recommend to continue Repatha   Follow Up Plan: PCP on 05/11/24, PharmD as needed  Georga Killings, PharmD PGY-1 Pharmacy Resident  Marvell Slider, PharmD, BCACP, CPP Clinical Pharmacist, [facility]  30 min of patient care was provided to the patient during this visit time. No charge visit

## 2024-03-19 NOTE — Addendum Note (Signed)
 Addended by: Claudio Mondry D on: 03/19/2024 03:18 PM   Modules accepted: Level of Service

## 2024-04-23 DIAGNOSIS — R309 Painful micturition, unspecified: Secondary | ICD-10-CM | POA: Diagnosis not present

## 2024-04-27 DIAGNOSIS — K8020 Calculus of gallbladder without cholecystitis without obstruction: Secondary | ICD-10-CM

## 2024-04-27 HISTORY — DX: Calculus of gallbladder without cholecystitis without obstruction: K80.20

## 2024-04-30 DIAGNOSIS — R300 Dysuria: Secondary | ICD-10-CM | POA: Diagnosis not present

## 2024-04-30 DIAGNOSIS — I10 Essential (primary) hypertension: Secondary | ICD-10-CM | POA: Diagnosis not present

## 2024-04-30 DIAGNOSIS — K8020 Calculus of gallbladder without cholecystitis without obstruction: Secondary | ICD-10-CM | POA: Diagnosis not present

## 2024-04-30 DIAGNOSIS — R10817 Generalized abdominal tenderness: Secondary | ICD-10-CM | POA: Diagnosis not present

## 2024-04-30 DIAGNOSIS — M51369 Other intervertebral disc degeneration, lumbar region without mention of lumbar back pain or lower extremity pain: Secondary | ICD-10-CM | POA: Diagnosis not present

## 2024-04-30 DIAGNOSIS — R011 Cardiac murmur, unspecified: Secondary | ICD-10-CM | POA: Diagnosis not present

## 2024-04-30 DIAGNOSIS — R000 Tachycardia, unspecified: Secondary | ICD-10-CM | POA: Diagnosis not present

## 2024-04-30 DIAGNOSIS — R1030 Lower abdominal pain, unspecified: Secondary | ICD-10-CM | POA: Diagnosis not present

## 2024-04-30 DIAGNOSIS — R109 Unspecified abdominal pain: Secondary | ICD-10-CM | POA: Diagnosis not present

## 2024-04-30 DIAGNOSIS — M48061 Spinal stenosis, lumbar region without neurogenic claudication: Secondary | ICD-10-CM | POA: Diagnosis not present

## 2024-04-30 DIAGNOSIS — K838 Other specified diseases of biliary tract: Secondary | ICD-10-CM | POA: Diagnosis not present

## 2024-04-30 DIAGNOSIS — R110 Nausea: Secondary | ICD-10-CM | POA: Diagnosis not present

## 2024-04-30 DIAGNOSIS — R1031 Right lower quadrant pain: Secondary | ICD-10-CM | POA: Diagnosis not present

## 2024-04-30 DIAGNOSIS — Z87891 Personal history of nicotine dependence: Secondary | ICD-10-CM | POA: Diagnosis not present

## 2024-05-11 ENCOUNTER — Ambulatory Visit: Payer: Medicare Other | Admitting: Family Medicine

## 2024-05-11 ENCOUNTER — Encounter: Payer: Self-pay | Admitting: Family Medicine

## 2024-05-11 VITALS — BP 137/80 | HR 62 | Temp 98.1°F | Ht 64.0 in | Wt 198.0 lb

## 2024-05-11 DIAGNOSIS — R300 Dysuria: Secondary | ICD-10-CM | POA: Diagnosis not present

## 2024-05-11 DIAGNOSIS — R350 Frequency of micturition: Secondary | ICD-10-CM

## 2024-05-11 DIAGNOSIS — K219 Gastro-esophageal reflux disease without esophagitis: Secondary | ICD-10-CM

## 2024-05-11 DIAGNOSIS — E1159 Type 2 diabetes mellitus with other circulatory complications: Secondary | ICD-10-CM

## 2024-05-11 DIAGNOSIS — I152 Hypertension secondary to endocrine disorders: Secondary | ICD-10-CM

## 2024-05-11 DIAGNOSIS — Z794 Long term (current) use of insulin: Secondary | ICD-10-CM

## 2024-05-11 DIAGNOSIS — E1169 Type 2 diabetes mellitus with other specified complication: Secondary | ICD-10-CM | POA: Diagnosis not present

## 2024-05-11 DIAGNOSIS — Z79899 Other long term (current) drug therapy: Secondary | ICD-10-CM

## 2024-05-11 DIAGNOSIS — G720 Drug-induced myopathy: Secondary | ICD-10-CM | POA: Diagnosis not present

## 2024-05-11 DIAGNOSIS — M85852 Other specified disorders of bone density and structure, left thigh: Secondary | ICD-10-CM | POA: Diagnosis not present

## 2024-05-11 DIAGNOSIS — R1011 Right upper quadrant pain: Secondary | ICD-10-CM

## 2024-05-11 DIAGNOSIS — K8020 Calculus of gallbladder without cholecystitis without obstruction: Secondary | ICD-10-CM

## 2024-05-11 DIAGNOSIS — F418 Other specified anxiety disorders: Secondary | ICD-10-CM

## 2024-05-11 LAB — URINALYSIS, ROUTINE W REFLEX MICROSCOPIC
Bilirubin, UA: NEGATIVE
Glucose, UA: NEGATIVE
Ketones, UA: NEGATIVE
Leukocytes,UA: NEGATIVE
Nitrite, UA: NEGATIVE
Protein,UA: NEGATIVE
RBC, UA: NEGATIVE
Specific Gravity, UA: 1.02 (ref 1.005–1.030)
Urobilinogen, Ur: 0.2 mg/dL (ref 0.2–1.0)
pH, UA: 5.5 (ref 5.0–7.5)

## 2024-05-11 LAB — BAYER DCA HB A1C WAIVED: HB A1C (BAYER DCA - WAIVED): 6.4 % — ABNORMAL HIGH (ref 4.8–5.6)

## 2024-05-11 MED ORDER — AMLODIPINE BESYLATE 10 MG PO TABS: 10.0000 mg | ORAL_TABLET | Freq: Every day | ORAL | 3 refills | Status: DC

## 2024-05-11 MED ORDER — VALSARTAN 320 MG PO TABS: 320.0000 mg | ORAL_TABLET | Freq: Every day | ORAL | 3 refills | Status: DC

## 2024-05-11 MED ORDER — LANTUS SOLOSTAR 100 UNIT/ML ~~LOC~~ SOPN: PEN_INJECTOR | SUBCUTANEOUS | 3 refills | Status: DC

## 2024-05-11 MED ORDER — OZEMPIC (1 MG/DOSE) 4 MG/3ML ~~LOC~~ SOPN: PEN_INJECTOR | SUBCUTANEOUS | 3 refills | Status: DC

## 2024-05-11 MED ORDER — OMEPRAZOLE 20 MG PO CPDR: 20.0000 mg | DELAYED_RELEASE_CAPSULE | Freq: Every day | ORAL | 3 refills | Status: DC

## 2024-05-11 MED ORDER — METOPROLOL SUCCINATE ER 100 MG PO TB24: 100.0000 mg | ORAL_TABLET | Freq: Every day | ORAL | 3 refills | Status: DC

## 2024-05-11 MED ORDER — FLUOXETINE HCL 10 MG PO CAPS: 30.0000 mg | ORAL_CAPSULE | Freq: Every day | ORAL | 3 refills | Status: DC

## 2024-05-11 MED ORDER — OXYBUTYNIN CHLORIDE ER 5 MG PO TB24: 5.0000 mg | ORAL_TABLET | Freq: Every day | ORAL | 0 refills | Status: DC

## 2024-05-11 MED ORDER — LORAZEPAM 1 MG PO TABS: ORAL_TABLET | ORAL | 5 refills | Status: DC

## 2024-05-11 MED ORDER — HYDROCHLOROTHIAZIDE 25 MG PO TABS: 25.0000 mg | ORAL_TABLET | Freq: Every day | ORAL | 3 refills | Status: DC

## 2024-05-11 MED ORDER — DEXCOM G7 SENSOR MISC: 3 refills | Status: DC

## 2024-05-11 NOTE — Progress Notes (Signed)
 Subjective: CC:DM PCP: Jolinda Norene HERO, DO Diana Pratt is a 78 y.o. female presenting to clinic today for:  1. Type 2 Diabetes with hypertension, hyperlipidemia:  She is compliant with her medications.  Needs refills on her Dexcom G7 sensors.  Utilizing these for continuous glucose monitoring and denies any hypoglycemic episodes.  Diabetes Health Maintenance Due  Topic Date Due   HEMOGLOBIN A1C  05/04/2024   OPHTHALMOLOGY EXAM  07/01/2024   FOOT EXAM  05/11/2025    Last A1c:  Lab Results  Component Value Date   HGBA1C 5.8 (H) 11/04/2023    ROS: No chest pain, shortness of breath.  She did have some right upper quadrant pain recently and was seen in the ER.  Found to have gallstones with some mild gallbladder enlargement and was apparently referred to gastroenterology but she notes that she has not heard anything.  She is to see Dr. Delmar with [facility] but Dr. Delmar has left.  Would like assistance getting into appropriate specialist to treat this issue.  2.  Anxiety disorder Continues to use Ativan  as needed.  Denies any falls, dizziness, memory issues.  3.  Frequent urination She was worried that maybe she had a UTI.  Denies any burning but notes that she has been having frequent urination for several weeks now.  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 jayson acton   Depression    Diabetes mellitus    Diabetic neuropathy (HCC)    Gallstones 04/27/2024   GERD (gastroesophageal reflux disease)    occ heartburn   Humerus fracture    right   Hyperlipemia    Hypertension    Interstitial cystitis    Osteoporosis    Pneumonia    15 yrs ago   PONV (postoperative nausea and vomiting)    Shortness of breath dyspnea     Current Outpatient Medications:    Accu-Chek Softclix Lancets lancets, Check blood sugar 3 times daily Dx E11.40, Disp: 300 each, Rfl: 3   albuterol  (PROAIR  HFA) 108 (90 Base) MCG/ACT inhaler, Inhale 2 puffs into the lungs every 6 (six) hours as needed for shortness of breath., Disp: 54 g, Rfl: 2   aspirin  81 MG EC tablet, Take 81 mg by mouth daily., Disp: , Rfl:    blood glucose meter kit and supplies, Dispense based on patient and insurance preference. Use up to four times daily as directed. (FOR ICD-10 E10.9, E11.9)., Disp: 1 each, Rfl: 0   Blood Glucose Monitoring Suppl (ACCU-CHEK GUIDE) w/Device KIT, Use to check blood sugar up to TID and as needed. DX E11.49, Disp: 1 kit, Rfl: 0   budesonide -formoterol  (SYMBICORT ) 160-4.5 MCG/ACT inhaler, Inhale 2 puffs into the  lungs 2 (two) times daily., Disp: 3 each, Rfl: 4   calcium  carbonate 200 MG capsule, Take 250 mg by mouth daily., Disp: , Rfl:    Cholecalciferol (VITAMIN D3) 2000 UNITS capsule, Take 2,000 Units by mouth daily. , Disp: 60 capsule, Rfl: 11   conjugated estrogens  (PREMARIN ) vaginal cream, APPLY ONE-HALF GRAM (ONE-FOURTH APPLICATORFUL) VAGINALLY THREE TIMES A  WEEK, Disp: 30 g, Rfl: 3   Continuous Glucose Receiver (DEXCOM G7 RECEIVER) DEVI, UAD to continuously check BGs. E11.69, Disp: 1 each, Rfl: 0   Evolocumab  (REPATHA  SURECLICK) 140 MG/ML SOAJ, Inject 140 mg into the skin every 14 (fourteen) days., Disp: 6 mL, Rfl: 4   fluticasone (FLONASE) 50 MCG/ACT nasal spray, Place 2 sprays into the nose daily as needed for allergies. Congestion ,  Disp: , Rfl:    glucose blood (ACCU-CHEK GUIDE) test strip, Use to check BG up to tid.  Dx: type 2 diabetes requiring insulin  therapy E11.40, Disp: 300 each, Rfl: 12   hydroxypropyl methylcellulose (ISOPTO TEARS) 2.5 % ophthalmic solution, Place 1 drop into both eyes daily as needed. for dry eyes, Disp: , Rfl:    Insulin  Pen Needle (PEN NEEDLES) 31G X 6 MM MISC, Use to administer insulin  once qid. Dx E11.69 Use Leader brand, Disp: 100 each, Rfl: 3   loratadine  (CLARITIN ) 10 MG tablet, Take 1 tablet (10 mg total) by mouth daily. For allergies/ drainage, Disp: 90 tablet, Rfl: 3   Multiple Vitamins-Minerals (CENTRUM SILVER PO), Take 1 tablet by mouth daily., Disp: , Rfl:    oxybutynin  (DITROPAN  XL) 5 MG 24 hr tablet, Take 1 tablet (5 mg total) by mouth at bedtime. For bladder, Disp: 90 tablet, Rfl: 0   tiZANidine  (ZANAFLEX ) 4 MG tablet, Take 0.5-1 tablets (2-4 mg total) by mouth every 8 (eight) hours as needed for muscle spasms., Disp: 30 tablet, Rfl: 0   amLODipine  (NORVASC ) 10 MG tablet, Take 1 tablet (10 mg total) by mouth daily., Disp: 90 tablet, Rfl: 3   Continuous Glucose Sensor (DEXCOM G7 SENSOR) MISC, Check BGs continuosly. Change every 10 days. E11.69, Disp: 9 each, Rfl: 3   FLUoxetine  (PROZAC ) 10 MG capsule, Take 3 capsules (30 mg total) by mouth daily., Disp: 270 capsule, Rfl: 3   hydrochlorothiazide  (HYDRODIURIL ) 25 MG tablet, Take 1 tablet (25 mg total) by mouth daily., Disp: 90 tablet, Rfl: 3   insulin  glargine (LANTUS  SOLOSTAR) 100 UNIT/ML Solostar Pen, Inject 32 units in the morning and 23 units in the evening., Disp: 60 mL, Rfl: 3   [START ON 05/27/2024] LORazepam  (ATIVAN ) 1 MG tablet, Take 1/2-1 tablet daily if needed for anxiety.  May repeat dose once if needed during the day. PUT ON FILE, Disp: 45 tablet, Rfl: 5   metoprolol  succinate (TOPROL -XL) 100 MG 24 hr tablet, Take 1 tablet (100 mg total) by mouth daily. Take with or immediately following a meal., Disp: 90 tablet, Rfl: 3    omeprazole  (PRILOSEC) 20 MG capsule, Take 1 capsule (20 mg total) by mouth daily., Disp: 90 capsule, Rfl: 3   Semaglutide , 1 MG/DOSE, (OZEMPIC , 1 MG/DOSE,) 4 MG/3ML SOPN, INJECT 1 MG SUBCUTANEOUSLY ONCE A WEEK - (ADMINISTER BY SUBCUTANEOUS INJECTION INTO THE ABDOMEN, THIGH, OR UPPER ARM AT ANY TIME OF DAY ON THE SAME DAY EACH WEEK) DISCARD PEN 56 DAYS AFTER FIRST USE, Disp: 9 mL, Rfl: 3   valsartan  (DIOVAN ) 320 MG tablet, Take 1 tablet (320 mg total) by mouth daily., Disp: 90 tablet, Rfl: 3 Social History   Socioeconomic History  Marital status: Married    Spouse name: [name]   Number of children: 1   Years of education: 13   Highest education level: Some college, no degree  Occupational History   Occupation: retired     Comment: CNA   Tobacco Use   Smoking status: Former    Current packs/day: 0.00    Average packs/day: 0.5 packs/day for 34.0 years (17.0 ttl pk-yrs)    Types: Cigarettes    Start date: 06/01/1960    Quit date: 06/01/1994    Years since quitting: 29.9   Smokeless tobacco: Never  Vaping Use   Vaping status: Never Used  Substance and Sexual Activity   Alcohol use: No   Drug use: No   Sexual activity: Yes    Birth control/protection: Surgical    Comment: hyst  Other Topics Concern   Not on file  Social History Narrative   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[place] [employer]   Social Drivers of Corporate investment banker Strain: Low Risk  (08/15/2023)   Overall Financial Resource Strain (CARDIA)    Difficulty of Paying Living Expenses: Not hard at all  Food Insecurity: No Food Insecurity (08/15/2023)   Hunger Vital Sign    Worried About Running Out of Food in the Last Year: Never true    Ran Out of Food in the Last Year: Never true  Transportation Needs: No Transportation Needs (08/15/2023)   PRAPARE - Administrator, Civil Service (Medical): No    Lack of Transportation (Non-Medical): No  Physical  Activity: Inactive (08/15/2023)   Exercise Vital Sign    Days of Exercise per Week: 0 days    Minutes of Exercise per Session: 0 min  Stress: No Stress Concern Present (08/15/2023)   [employer] of Occupational Health - Occupational Stress Questionnaire    Feeling of Stress : Not at all  Social Connections: Moderately Isolated (08/15/2023)   Social Connection and Isolation Panel    Frequency of Communication with Friends and Family: More than three times a week    Frequency of Social Gatherings with Friends and Family: More than three times a week    Attends Religious Services: Never    Database administrator or Organizations: No    Attends Banker Meetings: Never    Marital Status: Married  Catering manager Violence: Not At Risk (04/30/2024)   Received from [facility]   HITS    Over the last 12 months how often did your partner physically hurt you?: Never    Over the last 12 months how often did your partner insult you or talk down to you?: Never    Over the last 12 months how often did your partner threaten you with physical harm?: Never    Over the last 12 months how often did your partner scream or curse at you?: Never   Family History  Problem Relation Age of Onset   Parkinsonism Mother    Dementia Mother    Colon cancer Father 55       MULTIPLE CO-MORBIDITIES   Cancer Father    Diabetes Paternal Aunt    Diabetes Paternal Uncle    Diabetes Paternal Grandmother    Congestive Heart Failure Paternal Grandfather    Congestive Heart Failure Maternal Grandmother    Stroke Maternal Grandfather    Colon polyps Neg Hx     Objective: Office vital signs reviewed. BP 137/80   Pulse  62   Temp 98.1 F (36.7 C)   Ht 5' 4 (1.626 m)   Wt 198 lb (89.8 kg)   SpO2 94%   BMI 33.99 kg/m   Physical Examination:  General: Awake, alert, well nourished, No acute distress HEENT: No scleral icterus.  Mucous membranes are moist Cardio: regular rate and rhythm, S1S2  heard, no murmurs appreciated Pulm: clear to auscultation bilaterally, no wheezes, rhonchi or rales; normal work of breathing on room air GI: soft, right upper quadrant tenderness palpation present.  Nondistended.  No rebound or guarding.  Diabetic Foot Exam - Simple   Simple Foot Form Diabetic Foot exam was performed with the following findings: Yes 05/11/2024  9:00 AM  Visual Inspection See comments: Yes Sensation Testing Intact to touch and monofilament testing bilaterally: Yes Pulse Check Posterior Tibialis and Dorsalis pulse intact bilaterally: Yes Comments Elongated, onychomycotic changes to the nails of bilateral feet    Impression  IMPRESSION: 1.  Cholelithiasis. 2.  Mildly dilated common duct, nonspecific and could be from an obstructing lesion. This can be further assessed with an MRI/MRCP.  Electronically Signed by: Valma Companion, MD on 04/30/2024 12:44 PM Narrative  US  RUQ  INDICATION: Abdominal Pain  COMPARISON: None Available at Time of Dictation  TECHNIQUE: Realtime multiplanar grayscale and limited color Doppler ultrasound of the right upper quadrant was performed.  FINDINGS: HEPATOBILIARY: Liver: Normal echotexture. No focal lesions.   Gallbladder: Cholelithiasis. No gallbladder wall thickening. No pericholecystic fluid. Positive sonographic Murphy's sign. Common duct: Mildly dilated measuring 7 mm in maximal diameter  PANCREAS: Visualization is limited. No focal abnormalities are identified.  RIGHT KIDNEY: Right kidney is normal in size measuring 10.2 x 5.5 cm. Normal renal echotexture. No hydronephrosis. No suspicious renal masses.  VASCULATURE: No abdominal aortic aneurysm. The visualized inferior vena cava is patent. The portal vein is patent with normal direction of flow.    IMPRESSION: 1.  No renal ureteral calculi or obstruction.  2.  Question early cirrhotic change in the liver.  3.  Distended gallbladder with calculi, correlate for  focal tenderness.  4.  Indeterminate age L5 compression deformity, with associated degenerative changes at this level and spinal stenosis.     Electronically Signed by: Grayce Linear, MD on 04/30/2024 10:39 AM Narrative  INDICATION: Flank Pain. COMPARISON:  None.   TECHNIQUE: Routine CT of the abdomen and pelvis was performed without intravenous or oral contrast per renal calculus evaluation protocol. This exam is intended to evaluate for presence of renal calculi and does not adequately assess for renal or urothelial malignancies. Radiation dose reduction was utilized (automated exposure control, mA or kV adjustment based on patient size, or iterative image reconstruction).  FINDINGS:  VISUALIZED LOWER THORAX: Mild cardiomegaly. No pericardial effusion. Mild linear atelectasis or scar in the lower lobes.  UROLOGIC: Kidneys/ureters: No renal calculi. No hydronephrosis.  Lobulated contour of the kidneys. No definite mass. Urinary bladder:Intact.  SOLID VISCERA: Liver: No focal lesion. Some enlargement of the caudate lobe and left hepatic lobe with slight nodularity of the contour raising the possibility of early cirrhosis. Normal size. Gallbladder: Gallbladder is distended with several calculi. No biliary duct dilatation. Adrenal glands: Intact. Pancreas: Intact. Spleen: Intact.  GI: No bowel obstruction. Normal terminal ileum and appendix. No focal inflammation.  PERITONEAL CAVITY/RETROPERITONEUM: No free fluid. No pneumoperitoneum. No lymphadenopathy. Atherosclerotic changes of the aorta. No aneurysm.  PELVIS: The uterus is absent. Ovaries are within normal limits.  MUSCULOSKELETAL: There is a moderate compression deformity at L5 associated with  3 mm of retrolisthesis which is indeterminate age. No definite associated soft tissue swelling or fracture lucency. There is moderate facet arthropathy in the lower lumbar spine. There is spinal stenosis at this level.    Assessment/ Plan: 78 y.o. female   Calculus of gallbladder without cholecystitis without obstruction - Plan: Ambulatory referral to Gastroenterology, Ambulatory referral to General Surgery  RUQ pain - Plan: Ambulatory referral to Gastroenterology, Ambulatory referral to General Surgery  Type 2 diabetes mellitus with other specified complication, with long-term current use of insulin  (HCC) - Plan: Bayer DCA Hb A1c Waived, CMP14+EGFR, Microalbumin / creatinine urine ratio, Continuous Glucose Sensor (DEXCOM G7 SENSOR) MISC, insulin  glargine (LANTUS  SOLOSTAR) 100 UNIT/ML Solostar Pen, Semaglutide , 1 MG/DOSE, (OZEMPIC , 1 MG/DOSE,) 4 MG/3ML SOPN  Hypertension associated with diabetes (HCC) - Plan: CMP14+EGFR, amLODipine  (NORVASC ) 10 MG tablet, metoprolol  succinate (TOPROL -XL) 100 MG 24 hr tablet, valsartan  (DIOVAN ) 320 MG tablet  Hyperlipidemia associated with type 2 diabetes mellitus (HCC) - Plan: CMP14+EGFR  Statin myopathy - Plan: CMP14+EGFR  Depression with anxiety - Plan: CMP14+EGFR, LORazepam  (ATIVAN ) 1 MG tablet, FLUoxetine  (PROZAC ) 10 MG capsule  Chronic use of benzodiazepine for therapeutic purpose - Plan: CMP14+EGFR, ToxASSURE Select 13 (MW), Urine, LORazepam  (ATIVAN ) 1 MG tablet  Osteopenia of neck of left femur - Plan: VITAMIN D  25 Hydroxy (Vit-D Deficiency, Fractures), CMP14+EGFR  Urinary frequency - Plan: Urinalysis, Routine w reflex microscopic, Urine Culture, oxybutynin  (DITROPAN  XL) 5 MG 24 hr tablet  Gastroesophageal reflux disease without esophagitis - Plan: omeprazole  (PRILOSEC) 20 MG capsule  I have placed referral to her gastroenterologist at [facility] as well as general surgery as anticipate they will recommend surgical intervention.  Her exam was notable for persistent right upper quadrant pain.  I reviewed her ER notes.    All medications were renewed for diabetes.  Sugar was well-controlled  Blood pressure within normal range upon recheck.  No  changes  Statin induced myopathy.  Check liver enzymes  Benzodiazepine renewed.  UDS and CSC updated as per office policy and the national narcotic database reviewed with no red flags  Urinalysis without evidence of infection.  Trial of Ditropan   PPI renewed that we did not discuss GERD.  Did not discuss osteopenia but vitamin D  collected with labs today   Norene CHRISTELLA Fielding, DO [facility] [phone]

## 2024-05-11 NOTE — Progress Notes (Unsigned)
 GI Office Note    Referring Provider: Jolinda Norene HERO, DO Primary Care Physician:  Jolinda Norene HERO, DO  Primary Gastroenterologist: Carlin POUR. Cindie, DO (previously Dr. Harvey)  Chief Complaint   No chief complaint on file.  History of Present Illness   Diana Pratt is a 78 y.o. female presenting today at the request of Jolinda Norene M, DO for RUQ pain and cholelithiasis.   Colonoscopy 2015: -NO SOURCE FOR DIARRHEA IDENTIFIED. -The LEFT colon IS redundant -TWO RECTAL POLYPS REMOVED -Small internal hemorrhoids - Path: tubular adenoma and benign tissue - Pain and diarrhea suspected to be 2/2 IBS and cystitis - Advised next TCS in 5-10 years. High fiber diet   ED visit 04/30/24. *** RUQ US  with cholelithiasis and mildly dilated CBD to 7 mm (non specific but possible from obstructing lesion, advised possible further workup with MRI/MRCP. Positive sonographic Murphy's sign  ?early cirrhotic change?  OV with PCP yesterday 7/7. Discussed frequent urination. C/o prior RUQ pain and had previous ED visit. Given referral to GI and general surgery. Multiple other issues addressed. Gave omeprazole  for GERD.   Today:  Jaundice?, pruritus?, weight loss?  Wt Readings from Last 5 Encounters:  05/11/24 198 lb (89.8 kg)  11/04/23 201 lb 9.6 oz (91.4 kg)  08/15/23 195 lb (88.5 kg)  07/02/23 202 lb (91.6 kg)  01/30/23 195 lb (88.5 kg)    Current Outpatient Medications  Medication Sig Dispense Refill   Accu-Chek Softclix Lancets lancets Check blood sugar 3 times daily Dx E11.40 300 each 3   albuterol  (PROAIR  HFA) 108 (90 Base) MCG/ACT inhaler Inhale 2 puffs into the lungs every 6 (six) hours as needed for shortness of breath. 54 g 2   amLODipine  (NORVASC ) 10 MG tablet Take 1 tablet (10 mg total) by mouth daily. 90 tablet 3   aspirin  81 MG EC tablet Take 81 mg by mouth daily.     blood glucose meter kit and supplies Dispense based on patient and insurance preference. Use  up to four times daily as directed. (FOR ICD-10 E10.9, E11.9). 1 each 0   Blood Glucose Monitoring Suppl (ACCU-CHEK GUIDE) w/Device KIT Use to check blood sugar up to TID and as needed. DX E11.49 1 kit 0   budesonide -formoterol  (SYMBICORT ) 160-4.5 MCG/ACT inhaler Inhale 2 puffs into the lungs 2 (two) times daily. 3 each 4   calcium  carbonate 200 MG capsule Take 250 mg by mouth daily.     Cholecalciferol (VITAMIN D3) 2000 UNITS capsule Take 2,000 Units by mouth daily.  60 capsule 11   conjugated estrogens  (PREMARIN ) vaginal cream APPLY ONE-HALF GRAM (ONE-FOURTH APPLICATORFUL) VAGINALLY THREE TIMES A  WEEK 30 g 3   Continuous Glucose Receiver (DEXCOM G7 RECEIVER) DEVI UAD to continuously check BGs. E11.69 1 each 0   Continuous Glucose Sensor (DEXCOM G7 SENSOR) MISC Check BGs continuosly. Change every 10 days. E11.69 9 each 3   Evolocumab  (REPATHA  SURECLICK) 140 MG/ML SOAJ Inject 140 mg into the skin every 14 (fourteen) days. 6 mL 4   FLUoxetine  (PROZAC ) 10 MG capsule Take 3 capsules (30 mg total) by mouth daily. 270 capsule 3   fluticasone (FLONASE) 50 MCG/ACT nasal spray Place 2 sprays into the nose daily as needed for allergies. Congestion      glucose blood (ACCU-CHEK GUIDE) test strip Use to check BG up to tid.  Dx: type 2 diabetes requiring insulin  therapy E11.40 300 each 12   hydrochlorothiazide  (HYDRODIURIL ) 25 MG tablet Take 1 tablet (25 mg  total) by mouth daily. 90 tablet 3   hydroxypropyl methylcellulose (ISOPTO TEARS) 2.5 % ophthalmic solution Place 1 drop into both eyes daily as needed. for dry eyes     insulin  glargine (LANTUS  SOLOSTAR) 100 UNIT/ML Solostar Pen Inject 32 units in the morning and 23 units in the evening. 60 mL 3   Insulin  Pen Needle (PEN NEEDLES) 31G X 6 MM MISC Use to administer insulin  once qid. Dx E11.69 Use Leader brand 100 each 3   loratadine  (CLARITIN ) 10 MG tablet Take 1 tablet (10 mg total) by mouth daily. For allergies/ drainage 90 tablet 3   [START ON 05/27/2024]  LORazepam  (ATIVAN ) 1 MG tablet Take 1/2-1 tablet daily if needed for anxiety.  May repeat dose once if needed during the day. PUT ON FILE 45 tablet 5   metoprolol  succinate (TOPROL -XL) 100 MG 24 hr tablet Take 1 tablet (100 mg total) by mouth daily. Take with or immediately following a meal. 90 tablet 3   Multiple Vitamins-Minerals (CENTRUM SILVER PO) Take 1 tablet by mouth daily.     omeprazole  (PRILOSEC) 20 MG capsule Take 1 capsule (20 mg total) by mouth daily. 90 capsule 3   oxybutynin  (DITROPAN  XL) 5 MG 24 hr tablet Take 1 tablet (5 mg total) by mouth at bedtime. For bladder 90 tablet 0   Semaglutide , 1 MG/DOSE, (OZEMPIC , 1 MG/DOSE,) 4 MG/3ML SOPN INJECT 1 MG SUBCUTANEOUSLY ONCE A WEEK - (ADMINISTER BY SUBCUTANEOUS INJECTION INTO THE ABDOMEN, THIGH, OR UPPER ARM AT ANY TIME OF DAY ON THE SAME DAY EACH WEEK) DISCARD PEN 56 DAYS AFTER FIRST USE 9 mL 3   tiZANidine  (ZANAFLEX ) 4 MG tablet Take 0.5-1 tablets (2-4 mg total) by mouth every 8 (eight) hours as needed for muscle spasms. 30 tablet 0   valsartan  (DIOVAN ) 320 MG tablet Take 1 tablet (320 mg total) by mouth daily. 90 tablet 3   No current facility-administered medications for this visit.    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 jayson acton   Depression    Diabetes mellitus    Diabetic neuropathy (HCC)    Gallstones 04/27/2024   GERD (gastroesophageal reflux disease)    occ heartburn   Humerus fracture    right   Hyperlipemia    Hypertension    Interstitial cystitis    Osteoporosis    Pneumonia    15 yrs ago   PONV (postoperative nausea and vomiting)    Shortness of breath dyspnea     Past Surgical History:  Procedure Laterality Date   ABDOMINAL HYSTERECTOMY  03/1984   partial   COLONOSCOPY N/A 07/19/2014   Procedure: COLONOSCOPY;  Surgeon: Margo LITTIE Haddock, MD;  [facility];  Service: Endoscopy;  Laterality: N/A;  9:30   REVERSE SHOULDER ARTHROPLASTY Right 06/14/2016   Procedure: REVERSE SHOULDER ARTHROPLASTY;  Surgeon: Franky Pointer, MD;  Location: [facility];  Service: Orthopedics;  Laterality: Right;   Skin cancer removed  08/07/2016   left ear    Family History  Problem Relation Age of Onset   Parkinsonism Mother    Dementia Mother    Colon cancer Father 20       MULTIPLE CO-MORBIDITIES   Cancer Father    Diabetes Paternal Aunt    Diabetes  Paternal Uncle    Diabetes Paternal Grandmother    Congestive Heart Failure Paternal Grandfather    Congestive Heart Failure Maternal Grandmother    Stroke Maternal Grandfather    Colon polyps Neg Hx     Allergies as of 05/12/2024 - Review Complete 05/11/2024  Allergen Reaction Noted   Crestor  [rosuvastatin ]  06/05/2017   Lipitor [atorvastatin ] Other (See Comments) 06/08/2013   Livalo  [pitavastatin ] Other (See Comments) 12/09/2014   Morphine  and codeine Other (See Comments) 06/02/2011   Penicillins Hives and Itching 02/22/2011   Simvastatin Other (See Comments) 02/22/2011   Sulfa antibiotics Hives and Itching 02/22/2011   Zetia [ezetimibe] Cough 02/22/2011    Social History   Socioeconomic History   Marital status: Married    Spouse name: [name]   Number of children: 1   Years of education: 13   Highest education level: Some college, no degree  Occupational History   Occupation: retired     Comment: CNA   Tobacco Use   Smoking status: Former    Current packs/day: 0.00    Average packs/day: 0.5 packs/day for 34.0 years (17.0 ttl pk-yrs)    Types: Cigarettes    Start date: 06/01/1960    Quit date: 06/01/1994    Years since quitting: 29.9   Smokeless tobacco: Never  Vaping Use   Vaping status: Never Used  Substance and Sexual Activity   Alcohol use: No   Drug use: No   Sexual activity: Yes    Birth control/protection: Surgical    Comment: hyst  Other Topics  Concern   Not on file  Social History Narrative   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[place] [employer]   Social Drivers of Corporate investment banker Strain: Low Risk  (08/15/2023)   Overall Financial Resource Strain (CARDIA)    Difficulty of Paying Living Expenses: Not hard at all  Food Insecurity: No Food Insecurity (08/15/2023)   Hunger Vital Sign    Worried About Running Out of Food in the Last Year: Never true    Ran Out of Food in the Last Year: Never true  Transportation Needs: No Transportation Needs (08/15/2023)   PRAPARE - Administrator, Civil Service (Medical): No    Lack of Transportation (Non-Medical): No  Physical Activity: Inactive (08/15/2023)   Exercise Vital Sign    Days of Exercise per Week: 0 days    Minutes of Exercise per Session: 0 min  Stress: No Stress Concern Present (08/15/2023)   [employer] of Occupational Health - Occupational Stress Questionnaire    Feeling of Stress : Not at all  Social Connections: Moderately Isolated (08/15/2023)   Social Connection and Isolation Panel    Frequency of Communication with Friends and Family: More than three times a week    Frequency of Social Gatherings with Friends and Family: More than three times a week    Attends Religious Services: Never    Database administrator or Organizations: No    Attends Banker Meetings: Never    Marital Status: Married  Catering manager Violence: Not At Risk (04/30/2024)   Received from [facility]   HITS    Over the last 12 months how often did your partner physically hurt you?: Never    Over the last 12 months how often did your partner insult you or talk down to you?: Never    Over the last 12 months how often did your partner threaten  you with physical harm?: Never    Over the last 12 months how often did your partner scream or curse at you?: Never     Review of Systems   Gen: Denies any fever,  chills, fatigue, weight loss, lack of appetite.  CV: Denies chest pain, heart palpitations, peripheral edema, syncope.  Resp: Denies shortness of breath at rest or with exertion. Denies wheezing or cough.  GI: see HPI GU : Denies urinary burning, urinary frequency, urinary hesitancy MS: Denies joint pain, muscle weakness, cramps, or limitation of movement.  Derm: Denies rash, itching, dry skin Psych: Denies depression, anxiety, memory loss, and confusion Heme: Denies bruising, bleeding, and enlarged lymph nodes.  Physical Exam   There were no vitals taken for this visit.  General:   Alert and oriented. Pleasant and cooperative. Well-nourished and well-developed.  Head:  Normocephalic and atraumatic. Eyes:  Without icterus, sclera clear and conjunctiva pink.  Ears:  Normal auditory acuity. Mouth:  No deformity or lesions, oral mucosa pink.  Lungs:  Clear to auscultation bilaterally. No wheezes, rales, or rhonchi. No distress.  Heart:  S1, S2 present without murmurs appreciated.  Abdomen:  +BS, soft, non-tender and non-distended. No HSM noted. No guarding or rebound. No masses appreciated.  Rectal:  deferred *** Msk:  Symmetrical without gross deformities. Normal posture. Extremities:  Without edema. Neurologic:  Alert and  oriented x4;  grossly normal neurologically. Skin:  Intact without significant lesions or rashes. Psych:  Alert and cooperative. Normal mood and affect.  Assessment   Diana Pratt is a 78 y.o. female with a history of DM2, HTN, HLD, anxiety/depression, osteopenia, GERD, COPD, and skin cancer presenting today with ***  RUQ pain, cholelithiasis:  GERD:  PLAN   *** Follow up pending labs from PCP visit MRI/MRCP?? Agree with omeprazole  20 mg daily.  Follow up ***   Charmaine Melia, MSN, FNP-BC, AGACNP-BC [facility]

## 2024-05-12 ENCOUNTER — Ambulatory Visit (INDEPENDENT_AMBULATORY_CARE_PROVIDER_SITE_OTHER): Admitting: Gastroenterology

## 2024-05-12 ENCOUNTER — Encounter: Payer: Self-pay | Admitting: *Deleted

## 2024-05-12 ENCOUNTER — Ambulatory Visit: Payer: Self-pay | Admitting: Family Medicine

## 2024-05-12 ENCOUNTER — Encounter: Payer: Self-pay | Admitting: Gastroenterology

## 2024-05-12 VITALS — BP 138/72 | HR 69 | Temp 96.9°F | Ht 64.0 in | Wt 198.2 lb

## 2024-05-12 DIAGNOSIS — E559 Vitamin D deficiency, unspecified: Secondary | ICD-10-CM

## 2024-05-12 DIAGNOSIS — R1011 Right upper quadrant pain: Secondary | ICD-10-CM

## 2024-05-12 DIAGNOSIS — R10816 Epigastric abdominal tenderness: Secondary | ICD-10-CM | POA: Diagnosis not present

## 2024-05-12 DIAGNOSIS — K838 Other specified diseases of biliary tract: Secondary | ICD-10-CM | POA: Diagnosis not present

## 2024-05-12 DIAGNOSIS — K8020 Calculus of gallbladder without cholecystitis without obstruction: Secondary | ICD-10-CM

## 2024-05-12 DIAGNOSIS — Z860100 Personal history of colon polyps, unspecified: Secondary | ICD-10-CM

## 2024-05-12 DIAGNOSIS — Z800 Family history of malignant neoplasm of digestive organs: Secondary | ICD-10-CM | POA: Diagnosis not present

## 2024-05-12 DIAGNOSIS — K219 Gastro-esophageal reflux disease without esophagitis: Secondary | ICD-10-CM | POA: Diagnosis not present

## 2024-05-12 DIAGNOSIS — R10811 Right upper quadrant abdominal tenderness: Secondary | ICD-10-CM

## 2024-05-12 LAB — CMP14+EGFR
ALT: 17 IU/L (ref 0–32)
AST: 16 IU/L (ref 0–40)
Albumin: 4.3 g/dL (ref 3.8–4.8)
Alkaline Phosphatase: 73 IU/L (ref 44–121)
BUN/Creatinine Ratio: 28 (ref 12–28)
BUN: 26 mg/dL (ref 8–27)
Bilirubin Total: 0.5 mg/dL (ref 0.0–1.2)
CO2: 19 mmol/L — ABNORMAL LOW (ref 20–29)
Calcium: 9.4 mg/dL (ref 8.7–10.3)
Chloride: 101 mmol/L (ref 96–106)
Creatinine, Ser: 0.93 mg/dL (ref 0.57–1.00)
Globulin, Total: 2.6 g/dL (ref 1.5–4.5)
Glucose: 114 mg/dL — ABNORMAL HIGH (ref 70–99)
Potassium: 4.7 mmol/L (ref 3.5–5.2)
Sodium: 139 mmol/L (ref 134–144)
Total Protein: 6.9 g/dL (ref 6.0–8.5)
eGFR: 63 mL/min/1.73 (ref >59)

## 2024-05-12 LAB — MICROALBUMIN / CREATININE URINE RATIO
Creatinine, Urine: 102.1 mg/dL
Microalb/Creat Ratio: 16 mg/g{creat} (ref 0–29)
Microalbumin, Urine: 15.9 ug/mL

## 2024-05-12 LAB — VITAMIN D 25 HYDROXY (VIT D DEFICIENCY, FRACTURES): Vit D, 25-Hydroxy: 18.7 ng/mL — AB (ref 30.0–100.0)

## 2024-05-12 MED ORDER — VITAMIN D (ERGOCALCIFEROL) 1.25 MG (50000 UNIT) PO CAPS: 50000.0000 [IU] | ORAL_CAPSULE | ORAL | 3 refills | Status: DC

## 2024-05-12 MED ORDER — DICYCLOMINE HCL 10 MG PO CAPS: 10.0000 mg | ORAL_CAPSULE | Freq: Three times a day (TID) | ORAL | 0 refills | Status: DC

## 2024-05-12 NOTE — Patient Instructions (Addendum)
 We will get you scheduled for an MRI of your abdomen to further evaluate you gallbladder and your bile ducts.   Please continue to follow a low-fat diet.  I attached a handout to the back of your paperwork today in regards to a gallbladder eating plan.  I have sent in dicyclomine  for you to take 10 mg 2-3 times a day if needed.  Please try to take only if pain is severe.  I also attached some information about dicyclomine  for you and some possible side effects and warning signs.  I want you to continue to take omeprazole  20 mg once daily.  Please keep your appointment with general surgery later this month, we will try to get your MRI done prior to this appointment.  It was a pleasure to see you today. I want to create trusting relationships with patients. If you receive a survey regarding your visit,  I greatly appreciate you taking time to fill this out on paper or through your MyChart. I value your feedback.  Charmaine Melia, MSN, FNP-BC, AGACNP-BC [facility]

## 2024-05-13 LAB — TOXASSURE SELECT 13 (MW), URINE

## 2024-05-13 LAB — URINE CULTURE

## 2024-05-18 ENCOUNTER — Ambulatory Visit (HOSPITAL_COMMUNITY)
Admission: RE | Admit: 2024-05-18 | Discharge: 2024-05-18 | Disposition: A | Discharge status: Home / Self Care | Source: Ambulatory Visit | Attending: Gastroenterology | Admitting: Gastroenterology

## 2024-05-18 ENCOUNTER — Other Ambulatory Visit: Payer: Self-pay | Admitting: Gastroenterology

## 2024-05-18 DIAGNOSIS — K838 Other specified diseases of biliary tract: Secondary | ICD-10-CM | POA: Insufficient documentation

## 2024-05-18 DIAGNOSIS — R1011 Right upper quadrant pain: Secondary | ICD-10-CM

## 2024-05-18 DIAGNOSIS — K8020 Calculus of gallbladder without cholecystitis without obstruction: Secondary | ICD-10-CM | POA: Diagnosis not present

## 2024-05-18 MED ORDER — GADOBUTROL 1 MMOL/ML IV SOLN
9.0000 mL | Freq: Once | INTRAVENOUS | Status: AC | PRN
  Administered 2024-05-18: 9 mL via INTRAVENOUS

## 2024-05-20 ENCOUNTER — Ambulatory Visit: Payer: Self-pay | Admitting: Gastroenterology

## 2024-05-26 ENCOUNTER — Encounter: Payer: Self-pay | Admitting: Surgery

## 2024-05-26 ENCOUNTER — Ambulatory Visit (INDEPENDENT_AMBULATORY_CARE_PROVIDER_SITE_OTHER): Admitting: Surgery

## 2024-05-26 DIAGNOSIS — E1169 Type 2 diabetes mellitus with other specified complication: Secondary | ICD-10-CM

## 2024-05-26 DIAGNOSIS — Z794 Long term (current) use of insulin: Secondary | ICD-10-CM | POA: Diagnosis not present

## 2024-05-26 NOTE — Progress Notes (Unsigned)
 [facility] History and Physical  Reason for Referral: Cholelithiasis Referring Physician: Dr. Jolinda  Chief Complaint   New Patient (Initial Visit)     ROMEKA Pratt is a 78 y.o. female.  HPI: Patient presents for evaluation of cholelithiasis.  Starting earlier this year, she has had epigastric fullness and right upper quadrant pain episodes.  These episodes seems to happen daily.  She is not sure what brings on the episodes.  She confirms associated nausea without vomiting.  She has never had an EGD in the past.  Her past medical history significant for hypertension, hyperlipidemia, diabetes, and GERD.  She is currently taking Ozempic  on Mondays for her GERD.  She denies use of blood thinning medications.  Her surgical history significant for an abdominal hysterectomy.  She will socially drink alcohol.  She denies use of tobacco products and illicit drugs.  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 jayson acton   Depression    Diabetes mellitus    Diabetic neuropathy (HCC)    Gallstones 04/27/2024   GERD (gastroesophageal reflux disease)    occ heartburn   Humerus fracture    right   Hyperlipemia    Hypertension    Interstitial cystitis    Osteoporosis    Pneumonia    15 yrs ago   PONV (postoperative nausea and vomiting)    Shortness of breath dyspnea     Past Surgical History:  Procedure Laterality Date   ABDOMINAL HYSTERECTOMY  03/1984   partial   COLONOSCOPY N/A 07/19/2014   Procedure: COLONOSCOPY;  Surgeon: Margo LITTIE Haddock, MD;  [facility];  Service: Endoscopy;  Laterality: N/A;  9:30   REVERSE SHOULDER ARTHROPLASTY Right 06/14/2016   Procedure: REVERSE SHOULDER ARTHROPLASTY;  Surgeon: Franky Pointer, MD;  Location: [facility] OR;  Service: Orthopedics;  Laterality: Right;   Skin  cancer removed  08/07/2016   left ear    Family History  Problem Relation Age of Onset   Parkinsonism Mother    Dementia Mother    Colon cancer Father 77       MULTIPLE CO-MORBIDITIES   Cancer Father    Diabetes Paternal Aunt    Diabetes Paternal Uncle    Diabetes Paternal Grandmother    Congestive Heart Failure Paternal Grandfather    Congestive Heart Failure Maternal Grandmother    Stroke Maternal Grandfather    Colon polyps Neg Hx     Social History   Tobacco Use   Smoking status: Former    Current packs/day: 0.00    Average packs/day: 0.5 packs/day for 34.0 years (17.0 ttl pk-yrs)    Types: Cigarettes    Start date: 06/01/1960    Quit date: 06/01/1994    Years since quitting: 30.0   Smokeless tobacco: Never  Vaping Use   Vaping status: Never Used  Substance Use Topics   Alcohol use: No   Drug use: No    Medications: I have reviewed the patient's current medications. Allergies as of 05/26/2024       Reactions   Crestor  [rosuvastatin ]    Myalgias   Lipitor [atorvastatin ] Other (See Comments)   Myalgias   Livalo  [pitavastatin ] Other (See Comments)   myalgias   Morphine  And Codeine Other (See Comments)   Burning, itching  Penicillins Hives, Itching   Simvastatin Other (See Comments)   myalgias   Sulfa Antibiotics Hives, Itching   Zetia [ezetimibe] Cough        Medication List        Accurate as of May 26, 2024  9:42 AM. If you have any questions, ask your nurse or doctor.          Accu-Chek Guide test strip Generic drug: glucose blood Use to check BG up to tid.  Dx: type 2 diabetes requiring insulin  therapy E11.40   Accu-Chek Guide w/Device Kit Use to check blood sugar up to TID and as needed. DX E11.49   Accu-Chek Softclix Lancets lancets Check blood sugar 3 times daily Dx E11.40   albuterol  108 (90 Base) MCG/ACT inhaler Commonly known as: ProAir  HFA Inhale 2 puffs into the lungs every 6 (six) hours as needed for shortness of breath.    amLODipine  10 MG tablet Commonly known as: NORVASC  Take 1 tablet (10 mg total) by mouth daily.   aspirin  EC 81 MG tablet Take 81 mg by mouth daily.   blood glucose meter kit and supplies Dispense based on patient and insurance preference. Use up to four times daily as directed. (FOR ICD-10 E10.9, E11.9).   budesonide -formoterol  160-4.5 MCG/ACT inhaler Commonly known as: SYMBICORT  Inhale 2 puffs into the lungs 2 (two) times daily.   calcium  carbonate 200 MG capsule Take 250 mg by mouth daily.   CENTRUM SILVER PO Take 1 tablet by mouth daily.   Dexcom G7 Receiver Devi UAD to continuously check BGs. E11.69   Dexcom G7 Sensor Misc Check BGs continuosly. Change every 10 days. E11.69   dicyclomine  10 MG capsule Commonly known as: BENTYL  Take 1 capsule (10 mg total) by mouth 4 (four) times daily -  before meals and at bedtime.   FLUoxetine  10 MG capsule Commonly known as: PROZAC  Take 3 capsules (30 mg total) by mouth daily.   fluticasone 50 MCG/ACT nasal spray Commonly known as: FLONASE Place 2 sprays into the nose daily as needed for allergies. Congestion   hydrochlorothiazide  25 MG tablet Commonly known as: HYDRODIURIL  Take 1 tablet (25 mg total) by mouth daily.   hydroxypropyl methylcellulose / hypromellose 2.5 % ophthalmic solution Commonly known as: ISOPTO TEARS / GONIOVISC Place 1 drop into both eyes daily as needed. for dry eyes   Lantus  SoloStar 100 UNIT/ML Solostar Pen Generic drug: insulin  glargine Inject 32 units in the morning and 23 units in the evening.   loratadine  10 MG tablet Commonly known as: CLARITIN  Take 1 tablet (10 mg total) by mouth daily. For allergies/ drainage   LORazepam  1 MG tablet Commonly known as: ATIVAN  Take 1/2-1 tablet daily if needed for anxiety.  May repeat dose once if needed during the day. PUT ON FILE Start taking on: May 27, 2024   metoprolol  succinate 100 MG 24 hr tablet Commonly known as: TOPROL -XL Take 1 tablet (100  mg total) by mouth daily. Take with or immediately following a meal.   omeprazole  20 MG capsule Commonly known as: PRILOSEC Take 1 capsule (20 mg total) by mouth daily.   oxybutynin  5 MG 24 hr tablet Commonly known as: Ditropan  XL Take 1 tablet (5 mg total) by mouth at bedtime. For bladder   Ozempic  (1 MG/DOSE) 4 MG/3ML Sopn Generic drug: Semaglutide  (1 MG/DOSE) INJECT 1 MG SUBCUTANEOUSLY ONCE A WEEK - (ADMINISTER BY SUBCUTANEOUS INJECTION INTO THE ABDOMEN, THIGH, OR UPPER ARM AT ANY TIME OF DAY ON THE SAME DAY  EACH WEEK) DISCARD PEN 56 DAYS AFTER FIRST USE   Pen Needles 31G X 6 MM Misc Use to administer insulin  once qid. Dx E11.69 Use Leader brand   Premarin  vaginal cream Generic drug: conjugated estrogens  APPLY ONE-HALF GRAM (ONE-FOURTH APPLICATORFUL) VAGINALLY THREE TIMES A  WEEK   Repatha  SureClick 140 MG/ML Soaj Generic drug: Evolocumab  Inject 140 mg into the skin every 14 (fourteen) days.   tiZANidine  4 MG tablet Commonly known as: Zanaflex  Take 0.5-1 tablets (2-4 mg total) by mouth every 8 (eight) hours as needed for muscle spasms.   valsartan  320 MG tablet Commonly known as: DIOVAN  Take 1 tablet (320 mg total) by mouth daily.   Vitamin D  (Ergocalciferol ) 1.25 MG (50000 UNIT) Caps capsule Commonly known as: DRISDOL  Take 1 capsule (50,000 Units total) by mouth every 7 (seven) days.   Vitamin D3 50 MCG (2000 UT) capsule Take 2,000 Units by mouth daily.         ROS:  Constitutional: negative for chills, fatigue, and fevers Eyes: negative for visual disturbance and pain Ears, nose, mouth, throat, and face: positive for sinus problems, negative for ear drainage and sore throat Respiratory: positive for shortness of breath, negative for cough and wheezing Cardiovascular: negative for chest pain and palpitations Gastrointestinal: positive for abdominal pain, nausea, and reflux symptoms, negative for vomiting Genitourinary:negative for dysuria and  frequency Integument/breast: negative for dryness and rash Hematologic/lymphatic: negative for bleeding and lymphadenopathy Musculoskeletal:negative for back pain and neck pain Neurological: negative for dizziness and tremors Endocrine: negative for temperature intolerance  Blood pressure (!) 163/84, pulse 84, temperature 97.8 F (36.6 C), temperature source Oral, resp. rate 16, height 5' 4 (1.626 m), weight 196 lb (88.9 kg), SpO2 94%. Physical Exam Vitals reviewed.  Constitutional:      Appearance: Normal appearance.  HENT:     Head: Normocephalic and atraumatic.  Eyes:     Extraocular Movements: Extraocular movements intact.     Pupils: Pupils are equal, round, and reactive to light.  Cardiovascular:     Rate and Rhythm: Normal rate and regular rhythm.  Pulmonary:     Effort: Pulmonary effort is normal.     Breath sounds: Normal breath sounds.  Abdominal:     Comments: Abdomen soft, nondistended, no percussion tenderness, TTP in RUQ, no rigidity, guarding, or rebound tenderness; negative Murphy's sign  Musculoskeletal:        General: Normal range of motion.     Cervical back: Normal range of motion.  Skin:    General: Skin is warm and dry.  Neurological:     General: No focal deficit present.     Mental Status: She is alert and oriented to person, place, and time.  Psychiatric:        Mood and Affect: Mood normal.        Behavior: Behavior normal.     Results: CT abdomen and pelvis (04/30/24): IMPRESSION:  1.  No renal ureteral calculi or obstruction.   2.  Question early cirrhotic change in the liver.   3.  Distended gallbladder with calculi, correlate for focal tenderness.   4.  Indeterminate age L5 compression deformity, with associated degenerative changes at this level and spinal stenosis.   Abdominal US  (04/30/24): IMPRESSION: 1. Cholelithiasis without evidence of acute cholecystitis. 2. No biliary dilatation. Common bile duct is normal in caliber  on today's examination measuring up to 0.6 cm.  MRCP (05/18/24): IMPRESSION: 1. Cholelithiasis without evidence of acute cholecystitis. 2. No biliary dilatation. Common bile duct  is normal in caliber on today's examination measuring up to 0.6 cm.  Assessment & Plan:  Diana Pratt is a 78 y.o. female who presents for evaluation of cholelithiasis.  -We discussed the pathophysiology of gallbladder disease, and the recommendations for cholecystectomy.  We also discussed that if any of her symptoms are unrelated to gallbladder pathology, removing her gallbladder will not alleviate the symptoms -I counseled the patient about the indication, risks and benefits of robotic assisted laparoscopic cholecystectomy.  She understands there is a very small chance for bleeding, infection, injury to normal structures (including common bile duct), conversion to open surgery, persistent symptoms, evolution of postcholecystectomy diarrhea, need for secondary interventions, anesthesia reaction, cardiopulmonary issues and other risks not specifically detailed here. I described the expected recovery, the plan for follow-up and the restrictions during the recovery phase.  All questions were answered. -Patient tentatively scheduled for surgery on 8/7 -Information provided to the patient regarding cholelithiasis, cholecystitis, cholecystectomy, and low-fat diet -Advised that she should present to the emergency department if she begins to have worsening right upper quadrant abdominal pain, nausea, vomiting, fever, and chills  All questions were answered to the satisfaction of the patient.  Note: Portions of this report may have been transcribed using voice recognition software. Every effort has been made to ensure accuracy; however, inadvertent computerized transcription errors may still be present.   Dorothyann Brittle, DO [facility] [address] [place], [place]  [id number] [phone] (office)

## 2024-05-29 NOTE — H&P (Signed)
 [facility] History and Physical  Reason for Referral: Cholelithiasis Referring Physician: Dr. Jolinda  Chief Complaint   New Patient (Initial Visit)     Diana Pratt is a 78 y.o. female.  HPI: Patient presents for evaluation of cholelithiasis.  Starting earlier this year, she has had epigastric fullness and right upper quadrant pain episodes.  These episodes seems to happen daily.  She is not sure what brings on the episodes.  She confirms associated nausea without vomiting.  She has never had an EGD in the past.  Her past medical history significant for hypertension, hyperlipidemia, diabetes, and GERD.  She is currently taking Ozempic  on Mondays for her GERD.  She denies use of blood thinning medications.  Her surgical history significant for an abdominal hysterectomy.  She will socially drink alcohol.  She denies use of tobacco products and illicit drugs.  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 jayson acton   Depression    Diabetes mellitus    Diabetic neuropathy (HCC)    Gallstones 04/27/2024   GERD (gastroesophageal reflux disease)    occ heartburn   Humerus fracture    right   Hyperlipemia    Hypertension    Interstitial cystitis    Osteoporosis    Pneumonia    15 yrs ago   PONV (postoperative nausea and vomiting)    Shortness of breath dyspnea     Past Surgical History:  Procedure Laterality Date   ABDOMINAL HYSTERECTOMY  03/1984   partial   COLONOSCOPY N/A 07/19/2014   Procedure: COLONOSCOPY;  Surgeon: Margo LITTIE Haddock, MD;  Location: [facility];  Service: Endoscopy;  Laterality: N/A;  9:30   REVERSE SHOULDER ARTHROPLASTY Right 06/14/2016   Procedure: REVERSE SHOULDER ARTHROPLASTY;  Surgeon: Franky Pointer, MD;  Location: [facility] OR;  Service: Orthopedics;  Laterality: Right;   Skin  cancer removed  08/07/2016   left ear    Family History  Problem Relation Age of Onset   Parkinsonism Mother    Dementia Mother    Colon cancer Father 4       MULTIPLE CO-MORBIDITIES   Cancer Father    Diabetes Paternal Aunt    Diabetes Paternal Uncle    Diabetes Paternal Grandmother    Congestive Heart Failure Paternal Grandfather    Congestive Heart Failure Maternal Grandmother    Stroke Maternal Grandfather    Colon polyps Neg Hx     Social History   Tobacco Use   Smoking status: Former    Current packs/day: 0.00    Average packs/day: 0.5 packs/day for 34.0 years (17.0 ttl pk-yrs)    Types: Cigarettes    Start date: 06/01/1960    Quit date: 06/01/1994    Years since quitting: 30.0   Smokeless tobacco: Never  Vaping Use   Vaping status: Never Used  Substance Use Topics   Alcohol use: No   Drug use: No    Medications: I have reviewed the patient's current medications. Allergies as of 05/26/2024       Reactions   Crestor  [rosuvastatin ]    Myalgias   Lipitor [atorvastatin ] Other (See Comments)   Myalgias   Livalo  [pitavastatin ] Other (See Comments)   myalgias   Morphine  And Codeine Other (See Comments)   Burning, itching  Penicillins Hives, Itching   Simvastatin Other (See Comments)   myalgias   Sulfa Antibiotics Hives, Itching   Zetia [ezetimibe] Cough        Medication List        Accurate as of May 26, 2024  9:42 AM. If you have any questions, ask your nurse or doctor.          Accu-Chek Guide test strip Generic drug: glucose blood Use to check BG up to tid.  Dx: type 2 diabetes requiring insulin  therapy E11.40   Accu-Chek Guide w/Device Kit Use to check blood sugar up to TID and as needed. DX E11.49   Accu-Chek Softclix Lancets lancets Check blood sugar 3 times daily Dx E11.40   albuterol  108 (90 Base) MCG/ACT inhaler Commonly known as: ProAir  HFA Inhale 2 puffs into the lungs every 6 (six) hours as needed for shortness of breath.    amLODipine  10 MG tablet Commonly known as: NORVASC  Take 1 tablet (10 mg total) by mouth daily.   aspirin  EC 81 MG tablet Take 81 mg by mouth daily.   blood glucose meter kit and supplies Dispense based on patient and insurance preference. Use up to four times daily as directed. (FOR ICD-10 E10.9, E11.9).   budesonide -formoterol  160-4.5 MCG/ACT inhaler Commonly known as: SYMBICORT  Inhale 2 puffs into the lungs 2 (two) times daily.   calcium  carbonate 200 MG capsule Take 250 mg by mouth daily.   CENTRUM SILVER PO Take 1 tablet by mouth daily.   Dexcom G7 Receiver Devi UAD to continuously check BGs. E11.69   Dexcom G7 Sensor Misc Check BGs continuosly. Change every 10 days. E11.69   dicyclomine  10 MG capsule Commonly known as: BENTYL  Take 1 capsule (10 mg total) by mouth 4 (four) times daily -  before meals and at bedtime.   FLUoxetine  10 MG capsule Commonly known as: PROZAC  Take 3 capsules (30 mg total) by mouth daily.   fluticasone 50 MCG/ACT nasal spray Commonly known as: FLONASE Place 2 sprays into the nose daily as needed for allergies. Congestion   hydrochlorothiazide  25 MG tablet Commonly known as: HYDRODIURIL  Take 1 tablet (25 mg total) by mouth daily.   hydroxypropyl methylcellulose / hypromellose 2.5 % ophthalmic solution Commonly known as: ISOPTO TEARS / GONIOVISC Place 1 drop into both eyes daily as needed. for dry eyes   Lantus  SoloStar 100 UNIT/ML Solostar Pen Generic drug: insulin  glargine Inject 32 units in the morning and 23 units in the evening.   loratadine  10 MG tablet Commonly known as: CLARITIN  Take 1 tablet (10 mg total) by mouth daily. For allergies/ drainage   LORazepam  1 MG tablet Commonly known as: ATIVAN  Take 1/2-1 tablet daily if needed for anxiety.  May repeat dose once if needed during the day. PUT ON FILE Start taking on: May 27, 2024   metoprolol  succinate 100 MG 24 hr tablet Commonly known as: TOPROL -XL Take 1 tablet (100  mg total) by mouth daily. Take with or immediately following a meal.   omeprazole  20 MG capsule Commonly known as: PRILOSEC Take 1 capsule (20 mg total) by mouth daily.   oxybutynin  5 MG 24 hr tablet Commonly known as: Ditropan  XL Take 1 tablet (5 mg total) by mouth at bedtime. For bladder   Ozempic  (1 MG/DOSE) 4 MG/3ML Sopn Generic drug: Semaglutide  (1 MG/DOSE) INJECT 1 MG SUBCUTANEOUSLY ONCE A WEEK - (ADMINISTER BY SUBCUTANEOUS INJECTION INTO THE ABDOMEN, THIGH, OR UPPER ARM AT ANY TIME OF DAY ON THE SAME DAY  EACH WEEK) DISCARD PEN 56 DAYS AFTER FIRST USE   Pen Needles 31G X 6 MM Misc Use to administer insulin  once qid. Dx E11.69 Use Leader brand   Premarin  vaginal cream Generic drug: conjugated estrogens  APPLY ONE-HALF GRAM (ONE-FOURTH APPLICATORFUL) VAGINALLY THREE TIMES A  WEEK   Repatha  SureClick 140 MG/ML Soaj Generic drug: Evolocumab  Inject 140 mg into the skin every 14 (fourteen) days.   tiZANidine  4 MG tablet Commonly known as: Zanaflex  Take 0.5-1 tablets (2-4 mg total) by mouth every 8 (eight) hours as needed for muscle spasms.   valsartan  320 MG tablet Commonly known as: DIOVAN  Take 1 tablet (320 mg total) by mouth daily.   Vitamin D  (Ergocalciferol ) 1.25 MG (50000 UNIT) Caps capsule Commonly known as: DRISDOL  Take 1 capsule (50,000 Units total) by mouth every 7 (seven) days.   Vitamin D3 50 MCG (2000 UT) capsule Take 2,000 Units by mouth daily.         ROS:  Constitutional: negative for chills, fatigue, and fevers Eyes: negative for visual disturbance and pain Ears, nose, mouth, throat, and face: positive for sinus problems, negative for ear drainage and sore throat Respiratory: positive for shortness of breath, negative for cough and wheezing Cardiovascular: negative for chest pain and palpitations Gastrointestinal: positive for abdominal pain, nausea, and reflux symptoms, negative for vomiting Genitourinary:negative for dysuria and  frequency Integument/breast: negative for dryness and rash Hematologic/lymphatic: negative for bleeding and lymphadenopathy Musculoskeletal:negative for back pain and neck pain Neurological: negative for dizziness and tremors Endocrine: negative for temperature intolerance  Blood pressure (!) 163/84, pulse 84, temperature 97.8 F (36.6 C), temperature source Oral, resp. rate 16, height 5' 4 (1.626 m), weight 196 lb (88.9 kg), SpO2 94%. Physical Exam Vitals reviewed.  Constitutional:      Appearance: Normal appearance.  HENT:     Head: Normocephalic and atraumatic.  Eyes:     Extraocular Movements: Extraocular movements intact.     Pupils: Pupils are equal, round, and reactive to light.  Cardiovascular:     Rate and Rhythm: Normal rate and regular rhythm.  Pulmonary:     Effort: Pulmonary effort is normal.     Breath sounds: Normal breath sounds.  Abdominal:     Comments: Abdomen soft, nondistended, no percussion tenderness, TTP in RUQ, no rigidity, guarding, or rebound tenderness; negative Murphy's sign  Musculoskeletal:        General: Normal range of motion.     Cervical back: Normal range of motion.  Skin:    General: Skin is warm and dry.  Neurological:     General: No focal deficit present.     Mental Status: She is alert and oriented to person, place, and time.  Psychiatric:        Mood and Affect: Mood normal.        Behavior: Behavior normal.     Results: CT abdomen and pelvis (04/30/24): IMPRESSION:  1.  No renal ureteral calculi or obstruction.   2.  Question early cirrhotic change in the liver.   3.  Distended gallbladder with calculi, correlate for focal tenderness.   4.  Indeterminate age L5 compression deformity, with associated degenerative changes at this level and spinal stenosis.   Abdominal US  (04/30/24): IMPRESSION: 1. Cholelithiasis without evidence of acute cholecystitis. 2. No biliary dilatation. Common bile duct is normal in caliber  on today's examination measuring up to 0.6 cm.  MRCP (05/18/24): IMPRESSION: 1. Cholelithiasis without evidence of acute cholecystitis. 2. No biliary dilatation. Common bile duct  is normal in caliber on today's examination measuring up to 0.6 cm.  Assessment & Plan:  ADESSA PRIMIANO is a 78 y.o. female who presents for evaluation of cholelithiasis.  -We discussed the pathophysiology of gallbladder disease, and the recommendations for cholecystectomy.  We also discussed that if any of her symptoms are unrelated to gallbladder pathology, removing her gallbladder will not alleviate the symptoms -I counseled the patient about the indication, risks and benefits of robotic assisted laparoscopic cholecystectomy.  She understands there is a very small chance for bleeding, infection, injury to normal structures (including common bile duct), conversion to open surgery, persistent symptoms, evolution of postcholecystectomy diarrhea, need for secondary interventions, anesthesia reaction, cardiopulmonary issues and other risks not specifically detailed here. I described the expected recovery, the plan for follow-up and the restrictions during the recovery phase.  All questions were answered. -Patient tentatively scheduled for surgery on 8/7 -Information provided to the patient regarding cholelithiasis, cholecystitis, cholecystectomy, and low-fat diet -Advised that she should present to the emergency department if she begins to have worsening right upper quadrant abdominal pain, nausea, vomiting, fever, and chills  All questions were answered to the satisfaction of the patient.  Note: Portions of this report may have been transcribed using voice recognition software. Every effort has been made to ensure accuracy; however, inadvertent computerized transcription errors may still be present.   Diana Brittle, DO [facility] [address] [place], [place]  [id number] [phone] (office)

## 2024-06-04 NOTE — Patient Instructions (Signed)
 Your procedure is scheduled on: 06/11/2024  Report to [facility] Main Entrance at 6:00    AM.  Call this number if you have problems the morning of surgery: [phone]   Remember: Hold Ozempic  injection 7 days prior to procedure    Do not Eat or Drink after midnight   Take only 1/2 dose of Lantus  8 units the night before surgery  No diabetic medication am of procedure        No Smoking the morning of surgery  :  Take these medicines the morning of surgery with A SIP OF WATER: Amlodipine , Prozac , Metoprolol , Omeprazole , and ativan  if needed   Do not wear jewelry, make-up or nail polish.  Do not wear lotions, powders, or perfumes. You may wear deodorant.  Do not shave 48 hours prior to surgery. Men may shave face and neck.  Do not bring valuables to the hospital.  Contacts, dentures or bridgework may not be worn into surgery.  Leave suitcase in the car. After surgery it may be brought to your room.  For patients admitted to the hospital, checkout time is 11:00 AM the day of discharge.   Patients discharged the day of surgery will not be allowed to drive home.    Special Instructions: Shower using CHG night before surgery and shower the day of surgery use CHG.  Use special wash - you have one bottle of CHG for all showers.  You should use approximately 1/2 of the bottle for each shower. How to Use Chlorhexidine  at Home in the Shower Chlorhexidine  gluconate (CHG) is a germ-killing (antiseptic) wash that's used to clean the skin. It can get rid of the germs that normally live on the skin and can keep them away for about 24 hours. If you're having surgery, you may be told to shower with CHG at home the night before surgery. This can help lower your risk for infection. To use CHG wash in the shower, follow the steps below. Supplies needed: CHG body wash. Clean washcloth. Clean towel. How to use CHG in the shower Follow these steps unless you're told to use CHG in a different  way: Start the shower. Use your normal soap and shampoo to wash your face and hair. Turn off the shower or move out of the shower stream. Pour CHG onto a clean washcloth. Do not use any type of brush or rough sponge. Start at your neck, washing your body down to your toes. Make sure you: Wash the part of your body where the surgery will be done for at least 1 minute. Do not scrub. Do not use CHG on your head or face unless your health care provider tells you to. If it gets into your ears or eyes, rinse them well with water. Do not wash your genitals with CHG. Wash your back and under your arms. Make sure to wash skin folds. Let the CHG sit on your skin for 1-2 minutes or as long as told. Rinse your entire body in the shower, including all body creases and folds. Turn off the shower. Dry off with a clean towel. Do not put anything on your skin afterward, such as powder, lotion, or perfume. Put on clean clothes or pajamas. If it's the night before surgery, sleep in clean sheets. General tips Use CHG only as told, and follow the instructions on the label. Use the full amount of CHG as told. This is often one bottle. Do not smoke and stay away from flames after  using CHG. Your skin may feel sticky after using CHG. This is normal. The sticky feeling will go away as the CHG dries. Do not use CHG: If you have a chlorhexidine  allergy or have reacted to chlorhexidine  in the past. On open wounds or areas of skin that have broken skin, cuts, or scrapes. On babies younger than 60 months of age. Contact a health care provider if: You have questions about using CHG. Your skin gets irritated or itchy. You have a rash after using CHG. You swallow any CHG. Call your local poison control center [phone] in the [place] Your eyes itch badly, or they become very red or swollen. Your hearing changes. You have trouble seeing. If you can't reach your provider, go to an urgent care or emergency room. Do  not drive yourself. Get help right away if: You have swelling or tingling in your mouth or throat. You make high-pitched whistling sounds when you breathe, most often when you breathe out (wheeze). You have trouble breathing. These symptoms may be an emergency. Call 911 right away. Do not wait to see if the symptoms will go away. Do not drive yourself to the hospital. This information is not intended to replace advice given to you by your health care provider. Make sure you discuss any questions you have with your health care provider. Document Revised: 05/07/2023 Document Reviewed: 05/03/2022 Elsevier Patient Education  2024 [employer] Minimally Invasive Cholecystectomy, Care After The following information offers guidance on how to care for yourself after your procedure. Your health care provider may also give you more specific instructions. If you have problems or questions, contact your health care provider. What can I expect after the procedure? After the procedure, it is common to have: Pain at your incision sites. You will be given medicines to control this pain. Mild nausea or vomiting. Bloating and possible shoulder pain from the gas that was used during the procedure. Follow these instructions at home: Medicines Take over-the-counter and prescription medicines only as told by your health care provider. If you were prescribed an antibiotic medicine, take it as told by your health care provider. Do not stop using the antibiotic even if you start to feel better. Ask your health care provider if the medicine prescribed to you: Requires you to avoid driving or using machinery. Can cause constipation. You may need to take these actions to prevent or treat constipation: Drink enough fluid to keep your urine pale yellow. Take over-the-counter or prescription medicines. Eat foods that are high in fiber, such as beans, whole grains, and fresh fruits and vegetables. Limit foods that  are high in fat and processed sugars, such as fried or sweet foods. Incision care  Follow instructions from your health care provider about how to take care of your incisions. Make sure you: Wash your hands with soap and water for at least 20 seconds before and after you change your bandage (dressing). If soap and water are not available, use hand sanitizer. Change your dressing as told by your health care provider. Leave stitches (sutures), skin glue, or adhesive strips in place. These skin closures may need to be in place for 2 weeks or longer. If adhesive strip edges start to loosen and curl up, you may trim the loose edges. Do not remove adhesive strips completely unless your health care provider tells you to do that. Do not take baths, swim, or use a hot tub until your health care provider approves. Ask your health care provider if  you may take showers. You may only be allowed to take sponge baths. Check your incision area every day for signs of infection. Check for: More redness, swelling, or pain. Fluid or blood. Warmth. Pus or a bad smell. Activity Rest as told by your health care provider. Do not do activities that require a lot of effort. Avoid sitting for a long time without moving. Get up to take short walks every 1-2 hours. This is important to improve blood flow and breathing. Ask for help if you feel weak or unsteady. Do not lift anything that is heavier than 10 lb (4.5 kg), or the limit that you are told, until your health care provider says that it is safe. Do not play contact sports until your health care provider approves. Do not return to work or school until your health care provider approves. Return to your normal activities as told by your health care provider. Ask your health care provider what activities are safe for you. General instructions If you were given a sedative during the procedure, it can affect you for several hours. Do not drive or operate machinery until  your health care provider says that it is safe. Keep all follow-up visits. This is important. Contact a health care provider if: You develop a rash. You have more redness, swelling, or pain around your incisions. You have fluid or blood coming from your incisions. Your incisions feel warm to the touch. You have pus or a bad smell coming from your incisions. You have a fever. One or more of your incisions breaks open. Get help right away if: You have trouble breathing. You have chest pain. You have more pain in your shoulders. You faint or feel dizzy when you stand. You have severe pain in your abdomen. You have nausea or vomiting that lasts for more than one day. You have leg pain that is new or unusual, or if it is localized to one specific spot. These symptoms may represent a serious problem that is an emergency. Do not wait to see if the symptoms will go away. Get medical help right away. Call your local emergency services (911 in the [place] Do not drive yourself to the hospital. Summary After your procedure, it is common to have pain at the incision sites. You may also have nausea or bloating. Follow your health care provider's instructions about medicine, activity restrictions, and caring for your incision areas. Do not do activities that require a lot of effort. Contact a health care provider if you have a fever or other signs of infection, such as more redness, swelling, or pain around the incisions. Get help right away if you have chest pain, increasing pain in the shoulders, or trouble breathing. This information is not intended to replace advice given to you by your health care provider. Make sure you discuss any questions you have with your health care provider. Document Revised: 04/24/2021 Document Reviewed: 04/25/2021 Elsevier Patient Education  2024 [employer] General Anesthesia, Adult, Care After The following information offers guidance on how to care for yourself  after your procedure. Your health care provider may also give you more specific instructions. If you have problems or questions, contact your health care provider. What can I expect after the procedure? After the procedure, it is common for people to: Have pain or discomfort at the IV site. Have nausea or vomiting. Have a sore throat or hoarseness. Have trouble concentrating. Feel cold or chills. Feel weak, sleepy, or tired (fatigue). Have soreness  and body aches. These can affect parts of the body that were not involved in surgery. Follow these instructions at home: For the time period you were told by your health care provider:  Rest. Do not participate in activities where you could fall or become injured. Do not drive or use machinery. Do not drink alcohol. Do not take sleeping pills or medicines that cause drowsiness. Do not make important decisions or sign legal documents. Do not take care of children on your own. General instructions Drink enough fluid to keep your urine pale yellow. If you have sleep apnea, surgery and certain medicines can increase your risk for breathing problems. Follow instructions from your health care provider about wearing your sleep device: Anytime you are sleeping, including during daytime naps. While taking prescription pain medicines, sleeping medicines, or medicines that make you drowsy. Return to your normal activities as told by your health care provider. Ask your health care provider what activities are safe for you. Take over-the-counter and prescription medicines only as told by your health care provider. Do not use any products that contain nicotine or tobacco. These products include cigarettes, chewing tobacco, and vaping devices, such as e-cigarettes. These can delay incision healing after surgery. If you need help quitting, ask your health care provider. Contact a health care provider if: You have nausea or vomiting that does not get better  with medicine. You vomit every time you eat or drink. You have pain that does not get better with medicine. You cannot urinate or have bloody urine. You develop a skin rash. You have a fever. Get help right away if: You have trouble breathing. You have chest pain. You vomit blood. These symptoms may be an emergency. Get help right away. Call 911. Do not wait to see if the symptoms will go away. Do not drive yourself to the hospital. Summary After the procedure, it is common to have a sore throat, hoarseness, nausea, vomiting, or to feel weak, sleepy, or fatigue. For the time period you were told by your health care provider, do not drive or use machinery. Get help right away if you have difficulty breathing, have chest pain, or vomit blood. These symptoms may be an emergency. This information is not intended to replace advice given to you by your health care provider. Make sure you discuss any questions you have with your health care provider. Document Revised: 01/19/2022 Document Reviewed: 01/19/2022 Elsevier Patient Education  2024 [employer].

## 2024-06-09 ENCOUNTER — Telehealth: Payer: Self-pay | Admitting: Family Medicine

## 2024-06-09 ENCOUNTER — Encounter (HOSPITAL_COMMUNITY)
Admission: RE | Admit: 2024-06-09 | Discharge: 2024-06-09 | Disposition: A | Discharge status: Home / Self Care | Source: Ambulatory Visit | Attending: Surgery | Admitting: Surgery

## 2024-06-09 ENCOUNTER — Telehealth: Payer: Self-pay

## 2024-06-09 ENCOUNTER — Encounter (HOSPITAL_COMMUNITY): Payer: Self-pay

## 2024-06-09 ENCOUNTER — Telehealth: Payer: Self-pay | Admitting: *Deleted

## 2024-06-09 VITALS — BP 114/54 | HR 61 | Resp 18 | Ht 64.0 in | Wt 196.0 lb

## 2024-06-09 DIAGNOSIS — Z01810 Encounter for preprocedural cardiovascular examination: Secondary | ICD-10-CM | POA: Diagnosis not present

## 2024-06-09 DIAGNOSIS — Z01818 Encounter for other preprocedural examination: Secondary | ICD-10-CM | POA: Diagnosis present

## 2024-06-09 DIAGNOSIS — I10 Essential (primary) hypertension: Secondary | ICD-10-CM | POA: Diagnosis not present

## 2024-06-09 DIAGNOSIS — I498 Other specified cardiac arrhythmias: Secondary | ICD-10-CM

## 2024-06-09 DIAGNOSIS — R9431 Abnormal electrocardiogram [ECG] [EKG]: Secondary | ICD-10-CM | POA: Insufficient documentation

## 2024-06-09 DIAGNOSIS — R001 Bradycardia, unspecified: Secondary | ICD-10-CM | POA: Insufficient documentation

## 2024-06-09 NOTE — Telephone Encounter (Signed)
 Copied from CRM [id number]. Topic: Clinical - Request for Lab/Test Order >> Jun 09, 2024  1:15 PM Diana Pratt wrote: Reason for CRM: The patient has a gallbladder surgery scheduled for Thursday morning and the Surgeon is requiring an echocardiogram and advised her to request an order for it from Dr. Jolinda.

## 2024-06-09 NOTE — Progress Notes (Signed)
 Patient presents to PAT in a Junctional Rhythm.  Chart reviewed by Dr Herschell and will need an ECHO before undergoing anesthesia due to hypertension. Patient has an appointment with Dr Mellipeddi on Friday 06/12/24 so we will move patient to the depot until we get clearance.

## 2024-06-09 NOTE — Telephone Encounter (Signed)
 Surgery Date: 06/11/2024 Surgery: XI ROBOTIC ASSISTED LAPAROSCOPIC CHOLECYSTECTOMY  CPT Codes: 52437  Dx: X19.79  Received call from patient [phone]~ telephone.   Patient reports that she went to pre op appointment on 06/09/2024. Reports that she requires ECHO prior to surgery.   Call placed to [facility] pre op nurses. Was advised that patient was noted to have junctional rhythm on EKG. Reports that strip was reviewed by anesthesiologist who recommended ECHO prior to proceeding.   PCP was made aware and orders placed for ECHO and cardiology referral. Patient has appointment with [facility] 06/12/2024 with Dr. Mallipeddi.   Surgery has been postponed at this time awaiting cardiac review.   Call placed to patient and patient made aware.

## 2024-06-09 NOTE — Telephone Encounter (Signed)
 Duplicate

## 2024-06-09 NOTE — Progress Notes (Unsigned)
 Cardiology Office Note   Date:  06/10/2024   ID:  Diana Pratt, DOB 03-30-1946, MRN [id number]  PCP:  Jolinda Norene HERO, DO  Cardiologist:   None Referring:  Jolinda Norene HERO, DO  No chief complaint on file.     History of Present Illness: Diana Pratt is a 78 y.o. female who presents for preop as an urgent evaluation.   She is scheduled for gallbladder surgery tomorrow!  We were asked to do an echo before the surgery.  Her EKG was read by the computer as junctional rhythm which is incorrect.  I reviewed this and it was sinus brady.  She denies any cardiovascular symptoms.  She said she has been told she has low heart rate in the past.  She does not have any presyncope or syncope.  She does not feel this.  She does not have palpitations.  She denies any chest pressure, neck or arm discomfort.  She has no shortness of breath, PND or orthopnea.  She occasionally walks for exercise.  She has had no weight gain or edema.  She is complaining of right upper quadrant abdominal pain.  Of note the echocardiogram was done today and the EF was 65 to 70%.  There were no significant valvular abnormalities.  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 jayson acton   Depression    Diabetes mellitus    Diabetic neuropathy (HCC)    Gallstones 04/27/2024   GERD (gastroesophageal reflux disease)    occ heartburn   Humerus fracture    right   Hyperlipemia    Hypertension    Interstitial cystitis    Osteoporosis    Pneumonia    15 yrs ago   PONV (postoperative nausea and vomiting)    Shortness of breath dyspnea     Past Surgical History:  Procedure Laterality Date   ABDOMINAL HYSTERECTOMY  03/1984   partial   COLONOSCOPY N/A 07/19/2014   Procedure: COLONOSCOPY;  Surgeon: Margo LITTIE Haddock, MD;  Location: [facility];  Service: Endoscopy;  Laterality: N/A;  9:30   REVERSE SHOULDER ARTHROPLASTY Right 06/14/2016   Procedure: REVERSE SHOULDER ARTHROPLASTY;  Surgeon: Franky Pointer, MD;  Location: [facility];  Service: Orthopedics;  Laterality: Right;   Skin cancer removed  08/07/2016   left ear     Current Outpatient Medications  Medication Sig Dispense Refill   albuterol  (PROAIR  HFA) 108 (90 Base) MCG/ACT inhaler Inhale 2 puffs into the lungs every 6 (six) hours as needed for shortness of breath. 54 g 2   amLODipine  (NORVASC ) 10 MG tablet Take 1 tablet (10 mg total) by mouth daily. 90 tablet 3   budesonide -formoterol  (SYMBICORT ) 160-4.5 MCG/ACT inhaler Inhale 2 puffs into the lungs 2 (two) times daily. 3 each 4   calcium  carbonate (TUMS - DOSED IN MG ELEMENTAL CALCIUM ) 500 MG chewable tablet Chew 3 tablets by mouth daily.     Cholecalciferol (VITAMIN D3) 2000 UNITS capsule Take 2,000 Units by mouth daily.  60 capsule 11   conjugated estrogens  (PREMARIN ) vaginal cream APPLY ONE-HALF GRAM (ONE-FOURTH APPLICATORFUL) VAGINALLY THREE TIMES A  WEEK (Patient taking differently: Place 0.5 applicators vaginally daily as needed (atrophy).) 30 g 3   Evolocumab  (REPATHA  SURECLICK) 140 MG/ML SOAJ Inject 140  mg into the skin every 14 (fourteen) days. 6 mL 4   FLUoxetine  (PROZAC ) 10 MG capsule Take 3 capsules (30 mg total) by mouth daily. 270 capsule 3   fluticasone (FLONASE) 50 MCG/ACT nasal spray Place 2 sprays into both nostrils daily.     hydrochlorothiazide  (HYDRODIURIL ) 25 MG tablet Take 1 tablet (25 mg total) by mouth daily. 90 tablet 3   hydroxypropyl methylcellulose (ISOPTO TEARS) 2.5 % ophthalmic solution Place 1 drop into both eyes daily as needed for dry eyes.     insulin  glargine (LANTUS  SOLOSTAR) 100 UNIT/ML Solostar Pen Inject 32 units in the morning and 23 units in the evening. (Patient taking differently: Inject 42 Units into the skin See admin instructions. Inject 42 units in the morning and 17 units in the  evening.) 60 mL 3   loratadine  (CLARITIN ) 10 MG tablet Take 1 tablet (10 mg total) by mouth daily. For allergies/ drainage 90 tablet 3   LORazepam  (ATIVAN ) 1 MG tablet Take 1/2-1 tablet daily if needed for anxiety.  May repeat dose once if needed during the day. PUT ON FILE 45 tablet 5   metoprolol  succinate (TOPROL -XL) 100 MG 24 hr tablet Take 1 tablet (100 mg total) by mouth daily. Take with or immediately following a meal. 90 tablet 3   Multiple Vitamins-Minerals (CENTRUM SILVER PO) Take 1 tablet by mouth daily.     omeprazole  (PRILOSEC) 20 MG capsule Take 1 capsule (20 mg total) by mouth daily. 90 capsule 3   oxybutynin  (DITROPAN  XL) 5 MG 24 hr tablet Take 1 tablet (5 mg total) by mouth at bedtime. For bladder 90 tablet 0   tiZANidine  (ZANAFLEX ) 4 MG tablet Take 0.5-1 tablets (2-4 mg total) by mouth every 8 (eight) hours as needed for muscle spasms. 30 tablet 0   valsartan  (DIOVAN ) 320 MG tablet Take 1 tablet (320 mg total) by mouth daily. 90 tablet 3   Vitamin D , Ergocalciferol , (DRISDOL ) 1.25 MG (50000 UNIT) CAPS capsule Take 1 capsule (50,000 Units total) by mouth every 7 (seven) days. 12 capsule 3   Accu-Chek Softclix Lancets lancets Check blood sugar 3 times daily Dx E11.40 300 each 3   blood glucose meter kit and supplies Dispense based on patient and insurance preference. Use up to four times daily as directed. (FOR ICD-10 E10.9, E11.9). 1 each 0   Blood Glucose Monitoring Suppl (ACCU-CHEK GUIDE) w/Device KIT Use to check blood sugar up to TID and as needed. DX E11.49 1 kit 0   Continuous Glucose Receiver (DEXCOM G7 RECEIVER) DEVI UAD to continuously check BGs. E11.69 1 each 0   Continuous Glucose Sensor (DEXCOM G7 SENSOR) MISC Check BGs continuosly. Change every 10 days. E11.69 9 each 3   glucose blood (ACCU-CHEK GUIDE) test strip Use to check BG up to tid.  Dx: type 2 diabetes requiring insulin  therapy E11.40 300 each 12   Insulin  Pen Needle (PEN NEEDLES) 31G X 6 MM MISC Use to  administer insulin  once qid. Dx E11.69 Use Leader brand 100 each 3   Semaglutide , 1 MG/DOSE, (OZEMPIC , 1 MG/DOSE,) 4 MG/3ML SOPN INJECT 1 MG SUBCUTANEOUSLY ONCE A WEEK - (ADMINISTER BY SUBCUTANEOUS INJECTION INTO THE ABDOMEN, THIGH, OR UPPER ARM AT ANY TIME OF DAY ON THE SAME DAY EACH WEEK) DISCARD PEN 56 DAYS AFTER FIRST USE (Patient not taking: Reported on 06/10/2024) 9 mL 3   No current facility-administered medications for this visit.    Allergies:   Crestor  [rosuvastatin ], Lipitor [atorvastatin ], Livalo  [pitavastatin ], Morphine  and codeine,  Penicillins, Simvastatin, Sulfa antibiotics, and Zetia [ezetimibe]    Social History:  The patient  reports that she quit smoking about 30 years ago. Her smoking use included cigarettes. She started smoking about 64 years ago. She has a 17 pack-year smoking history. She has never used smokeless tobacco. She reports that she does not drink alcohol and does not use drugs.   Family History:  The patient's family history includes Cancer in her father; Colon cancer (age of onset: 91) in her father; Congestive Heart Failure in her maternal grandmother and paternal grandfather; Dementia in her mother; Diabetes in her paternal aunt, paternal grandmother, and paternal uncle; Parkinsonism in her mother; Stroke in her maternal grandfather.    ROS:  Please see the history of present illness.   Otherwise, review of systems are positive for none.   All other systems are reviewed and negative.    PHYSICAL EXAM: VS:  BP (!) 150/78   Pulse 60   Ht 5' 4 (1.626 m)   Wt 197 lb (89.4 kg)   BMI 33.81 kg/m  , BMI Body mass index is 33.81 kg/m. GENERAL:  Well appearing HEENT:  Pupils equal round and reactive, fundi not visualized, oral mucosa unremarkable NECK:  No jugular venous distention, waveform within normal limits, carotid upstroke brisk and symmetric, no bruits, no thyromegaly LYMPHATICS:  No cervical, inguinal adenopathy LUNGS:  Clear to auscultation  bilaterally BACK:  No CVA tenderness CHEST:  Unremarkable HEART:  PMI not displaced or sustained,S1 and S2 within normal limits, no S3, no S4, no clicks, no rubs, no murmurs ABD:  Flat, positive bowel sounds normal in frequency in pitch, no bruits, no rebound, no guarding, no midline pulsatile mass, no hepatomegaly, no splenomegaly EXT:  2 plus pulses throughout, no edema, no cyanosis no clubbing SKIN:  No rashes no nodules NEURO:  Cranial nerves II through XII grossly intact, motor grossly intact throughout PSYCH:  Cognitively intact, oriented to person place and time    EKG:    Sinus bradycardia, rate 57, axis within normal limits, intervals within normal limits, no acute ST-T wave changes.    Recent Labs: 07/02/2023: Hemoglobin 14.3; Platelets 216 05/11/2024: ALT 17; BUN 26; Creatinine, Ser 0.93; Potassium 4.7; Sodium 139    Lipid Panel    Component Value Date/Time   CHOL 123 03/11/2024 0838   CHOL 243 (H) 05/29/2013 1037   TRIG 148 03/11/2024 0838   TRIG 171 (H) 12/25/2013 0855   TRIG 304 (H) 05/29/2013 1037   HDL 39 (L) 03/11/2024 0838   HDL 38 (L) 12/25/2013 0855   HDL 30 (L) 05/29/2013 1037   CHOLHDL 3.2 03/11/2024 0838   LDLCALC 58 03/11/2024 0838   LDLCALC 192 (H) 12/25/2013 0855   LDLCALC 152 (H) 05/29/2013 1037   LDLDIRECT 136 (H) 09/25/2021 1342      Wt Readings from Last 3 Encounters:  06/10/24 197 lb (89.4 kg)  06/09/24 196 lb (88.9 kg)  05/26/24 196 lb (88.9 kg)      Other studies Reviewed: Additional studies/ records that were reviewed today include: Echo, EKG. Review of the above records demonstrates:  Please see elsewhere in the note.     ASSESSMENT AND PLAN:  Preop: The patient has no high risk findings.  She does have sinus bradycardia but there were no significant arrhythmias on the EKG.  There are subtle P waves per my review.  She has a high functional level.  She is not going for high risk procedure.  Therefore, based  on ACC/AHA guidelines no  further testing is suggested.   Bradycardia: I do think she has some sinus bradycardia but she is not having any symptoms.  I do not suspect this was a junctional rhythm as there seems to be subtle P waves.  Regardless there is no high risk findings.  No further testing is indicated.  I did review blood work to include electrolytes.  I might suggest a TSH next time she has blood drawn.  Risk reduction: She does have risk factors include dyslipidemia and hypertension.  He is to be managed by her primary provider.  Hypertension: Her blood pressure seems to fluctuate and have asked her to keep a blood pressure diary and I would be happy to review these readings.  Current medicines are reviewed at length with the patient today.  The patient does not have concerns regarding medicines.  The following changes have been made:  no change  Labs/ tests ordered today include: None No orders of the defined types were placed in this encounter.    Disposition:   FU with with me as needed.     Signed, Lynwood Schilling, MD  06/10/2024 1:16 PM    [facility]

## 2024-06-09 NOTE — Telephone Encounter (Signed)
 Patient re-scheduled ECHO and cardiology appointment for 06/10/2024 wit Dr. Lavona.   Inquired if surgery could go on as scheduled. Advised that we cannot reschedule surgery until cardiology clears her.

## 2024-06-09 NOTE — Telephone Encounter (Signed)
 Patient scheduled for 8/06 for echocardiogram.

## 2024-06-09 NOTE — Telephone Encounter (Signed)
 I can certainly order this as stat but there is no guarantee this can be done before Thursday. She will likely need to establish care with a cardiologist and I have placed that referral as well  Orders Placed This Encounter  Procedures   Ambulatory referral to Cardiology    Referral Priority:   Urgent    Referral Type:   Consultation    Referral Reason:   Specialty Services Required    Number of Visits Requested:   1   ECHOCARDIOGRAM COMPLETE    Standing Status:   Future    Expiration Date:   06/09/2025    Where should this test be performed:   [facility]    Perflutren DEFINITY (image enhancing agent) should be administered unless hypersensitivity or allergy exist:   Administer Perflutren    Reason for exam-Echo:   Abnormal ECG  R94.31

## 2024-06-09 NOTE — Telephone Encounter (Signed)
 Copied from CRM [id number]. Topic: General - Other >> Jun 09, 2024 10:42 AM Avram MATSU wrote: Reason for CRM: Randine stated pt has history of high BP and she had a EKG done at the office there and it shows a junctional rhythm. They would like an echocardiogram done before her surgery on Thursday. Please give a callback to discuss further.   [phone]: [facility]

## 2024-06-09 NOTE — Telephone Encounter (Signed)
 Patient is calling to check on echocardiogram appt. Needs before Thursday. Please call.

## 2024-06-10 ENCOUNTER — Encounter: Payer: Self-pay | Admitting: Cardiology

## 2024-06-10 ENCOUNTER — Ambulatory Visit: Payer: Self-pay | Admitting: Family Medicine

## 2024-06-10 ENCOUNTER — Ambulatory Visit (HOSPITAL_COMMUNITY)
Admission: RE | Admit: 2024-06-10 | Discharge: 2024-06-10 | Disposition: A | Discharge status: Home / Self Care | Source: Ambulatory Visit | Attending: Cardiovascular Disease | Admitting: Cardiovascular Disease

## 2024-06-10 ENCOUNTER — Ambulatory Visit (INDEPENDENT_AMBULATORY_CARE_PROVIDER_SITE_OTHER): Admitting: Cardiology

## 2024-06-10 ENCOUNTER — Inpatient Hospital Stay (HOSPITAL_COMMUNITY)
Admission: RE | Admit: 2024-06-10 | Discharge: 2024-06-10 | Disposition: A | Discharge status: Home / Self Care | Source: Ambulatory Visit

## 2024-06-10 VITALS — BP 150/78 | HR 60 | Ht 64.0 in | Wt 197.0 lb

## 2024-06-10 DIAGNOSIS — E669 Obesity, unspecified: Secondary | ICD-10-CM | POA: Insufficient documentation

## 2024-06-10 DIAGNOSIS — J449 Chronic obstructive pulmonary disease, unspecified: Secondary | ICD-10-CM | POA: Insufficient documentation

## 2024-06-10 DIAGNOSIS — R001 Bradycardia, unspecified: Secondary | ICD-10-CM | POA: Diagnosis not present

## 2024-06-10 DIAGNOSIS — R0602 Shortness of breath: Secondary | ICD-10-CM | POA: Diagnosis not present

## 2024-06-10 DIAGNOSIS — Z6833 Body mass index (BMI) 33.0-33.9, adult: Secondary | ICD-10-CM | POA: Diagnosis not present

## 2024-06-10 DIAGNOSIS — I498 Other specified cardiac arrhythmias: Secondary | ICD-10-CM | POA: Diagnosis not present

## 2024-06-10 DIAGNOSIS — E119 Type 2 diabetes mellitus without complications: Secondary | ICD-10-CM | POA: Insufficient documentation

## 2024-06-10 DIAGNOSIS — E785 Hyperlipidemia, unspecified: Secondary | ICD-10-CM | POA: Insufficient documentation

## 2024-06-10 DIAGNOSIS — R9431 Abnormal electrocardiogram [ECG] [EKG]: Secondary | ICD-10-CM | POA: Insufficient documentation

## 2024-06-10 DIAGNOSIS — Z8249 Family history of ischemic heart disease and other diseases of the circulatory system: Secondary | ICD-10-CM | POA: Diagnosis not present

## 2024-06-10 DIAGNOSIS — I10 Essential (primary) hypertension: Secondary | ICD-10-CM | POA: Insufficient documentation

## 2024-06-10 DIAGNOSIS — Z87891 Personal history of nicotine dependence: Secondary | ICD-10-CM | POA: Insufficient documentation

## 2024-06-10 DIAGNOSIS — Z01810 Encounter for preprocedural cardiovascular examination: Secondary | ICD-10-CM | POA: Diagnosis not present

## 2024-06-10 DIAGNOSIS — I340 Nonrheumatic mitral (valve) insufficiency: Secondary | ICD-10-CM | POA: Diagnosis not present

## 2024-06-10 LAB — ECHOCARDIOGRAM COMPLETE
Area-P 1/2: 3.77 cm2
S' Lateral: 2.1 cm

## 2024-06-10 NOTE — Telephone Encounter (Signed)
 Patient cleared for surgery per cardiology.   Surgery scheduled for 06/11/2024.  Call placed to patient and patient made aware.

## 2024-06-10 NOTE — Patient Instructions (Addendum)

## 2024-06-11 ENCOUNTER — Ambulatory Visit (HOSPITAL_BASED_OUTPATIENT_CLINIC_OR_DEPARTMENT_OTHER): Admitting: Certified Registered Nurse Anesthetist

## 2024-06-11 ENCOUNTER — Encounter (HOSPITAL_COMMUNITY)
Admission: RE | Disposition: A | Discharge status: Home / Self Care | Payer: Self-pay | Source: Home / Self Care | Attending: Surgery

## 2024-06-11 ENCOUNTER — Other Ambulatory Visit: Payer: Self-pay

## 2024-06-11 ENCOUNTER — Ambulatory Visit (HOSPITAL_COMMUNITY): Admitting: Certified Registered Nurse Anesthetist

## 2024-06-11 ENCOUNTER — Encounter (HOSPITAL_COMMUNITY): Payer: Self-pay | Admitting: Surgery

## 2024-06-11 ENCOUNTER — Ambulatory Visit (HOSPITAL_COMMUNITY)
Admission: RE | Admit: 2024-06-11 | Discharge: 2024-06-11 | Disposition: A | Discharge status: Home / Self Care | Attending: Surgery | Admitting: Surgery

## 2024-06-11 DIAGNOSIS — Z87891 Personal history of nicotine dependence: Secondary | ICD-10-CM | POA: Diagnosis not present

## 2024-06-11 DIAGNOSIS — K7400 Hepatic fibrosis, unspecified: Secondary | ICD-10-CM | POA: Diagnosis not present

## 2024-06-11 DIAGNOSIS — K8010 Calculus of gallbladder with chronic cholecystitis without obstruction: Secondary | ICD-10-CM | POA: Diagnosis not present

## 2024-06-11 DIAGNOSIS — E119 Type 2 diabetes mellitus without complications: Secondary | ICD-10-CM | POA: Insufficient documentation

## 2024-06-11 DIAGNOSIS — Z90710 Acquired absence of both cervix and uterus: Secondary | ICD-10-CM | POA: Diagnosis not present

## 2024-06-11 DIAGNOSIS — J4489 Other specified chronic obstructive pulmonary disease: Secondary | ICD-10-CM | POA: Insufficient documentation

## 2024-06-11 DIAGNOSIS — K219 Gastro-esophageal reflux disease without esophagitis: Secondary | ICD-10-CM | POA: Insufficient documentation

## 2024-06-11 DIAGNOSIS — K8020 Calculus of gallbladder without cholecystitis without obstruction: Secondary | ICD-10-CM | POA: Diagnosis not present

## 2024-06-11 DIAGNOSIS — E785 Hyperlipidemia, unspecified: Secondary | ICD-10-CM | POA: Insufficient documentation

## 2024-06-11 DIAGNOSIS — K7460 Unspecified cirrhosis of liver: Secondary | ICD-10-CM | POA: Diagnosis not present

## 2024-06-11 DIAGNOSIS — F32A Depression, unspecified: Secondary | ICD-10-CM | POA: Insufficient documentation

## 2024-06-11 DIAGNOSIS — I10 Essential (primary) hypertension: Secondary | ICD-10-CM | POA: Insufficient documentation

## 2024-06-11 DIAGNOSIS — Z7985 Long-term (current) use of injectable non-insulin antidiabetic drugs: Secondary | ICD-10-CM | POA: Diagnosis not present

## 2024-06-11 DIAGNOSIS — J449 Chronic obstructive pulmonary disease, unspecified: Secondary | ICD-10-CM

## 2024-06-11 DIAGNOSIS — F419 Anxiety disorder, unspecified: Secondary | ICD-10-CM | POA: Diagnosis not present

## 2024-06-11 DIAGNOSIS — Z794 Long term (current) use of insulin: Secondary | ICD-10-CM | POA: Insufficient documentation

## 2024-06-11 LAB — GLUCOSE, CAPILLARY
Glucose-Capillary: 119 mg/dL — ABNORMAL HIGH (ref 70–99)
Glucose-Capillary: 136 mg/dL — ABNORMAL HIGH (ref 70–99)

## 2024-06-11 SURGERY — CHOLECYSTECTOMY, ROBOT-ASSISTED, LAPAROSCOPIC
Anesthesia: General | Site: Abdomen

## 2024-06-11 MED ORDER — CHLORHEXIDINE GLUCONATE CLOTH 2 % EX PADS: 6.0000 | MEDICATED_PAD | Freq: Once | CUTANEOUS | Status: DC

## 2024-06-11 MED ORDER — PHENYLEPHRINE 80 MCG/ML (10ML) SYRINGE FOR IV PUSH (FOR BLOOD PRESSURE SUPPORT)
PREFILLED_SYRINGE | INTRAVENOUS | Status: DC | PRN
  Administered 2024-06-11: 80 ug via INTRAVENOUS
  Administered 2024-06-11: 40 ug via INTRAVENOUS
  Administered 2024-06-11: 80 ug via INTRAVENOUS

## 2024-06-11 MED ORDER — CHLORHEXIDINE GLUCONATE 0.12 % MT SOLN
15.0000 mL | Freq: Once | OROMUCOSAL | Status: AC
  Administered 2024-06-11: 15 mL via OROMUCOSAL

## 2024-06-11 MED ORDER — BUPIVACAINE HCL (PF) 0.5 % IJ SOLN
INTRAMUSCULAR | Status: DC | PRN
  Administered 2024-06-11: 30 mL

## 2024-06-11 MED ORDER — LIDOCAINE 2% (20 MG/ML) 5 ML SYRINGE
INTRAMUSCULAR | Status: DC | PRN
  Administered 2024-06-11: 60 mg via INTRAVENOUS

## 2024-06-11 MED ORDER — FENTANYL CITRATE (PF) 250 MCG/5ML IJ SOLN
INTRAMUSCULAR | Status: AC
  Filled 2024-06-11: qty 5

## 2024-06-11 MED ORDER — ORAL CARE MOUTH RINSE: 15.0000 mL | Freq: Once | OROMUCOSAL | Status: AC

## 2024-06-11 MED ORDER — SODIUM CHLORIDE 0.9 % IV SOLN
2.0000 g | INTRAVENOUS | Status: AC
  Administered 2024-06-11: 2 g via INTRAVENOUS
  Filled 2024-06-11: qty 2

## 2024-06-11 MED ORDER — SUGAMMADEX SODIUM 200 MG/2ML IV SOLN
INTRAVENOUS | Status: AC
  Filled 2024-06-11: qty 2

## 2024-06-11 MED ORDER — FENTANYL CITRATE (PF) 100 MCG/2ML IJ SOLN
INTRAMUSCULAR | Status: DC | PRN
  Administered 2024-06-11: 100 ug via INTRAVENOUS
  Administered 2024-06-11: 50 ug via INTRAVENOUS

## 2024-06-11 MED ORDER — INDOCYANINE GREEN 25 MG IV SOLR
INTRAVENOUS | Status: AC
  Administered 2024-06-11: 2.5 mg via INTRAVENOUS
  Filled 2024-06-11: qty 10

## 2024-06-11 MED ORDER — LACTATED RINGERS IV SOLN: INTRAVENOUS | Status: DC

## 2024-06-11 MED ORDER — FENTANYL CITRATE PF 50 MCG/ML IJ SOSY: 25.0000 ug | PREFILLED_SYRINGE | INTRAMUSCULAR | Status: DC | PRN

## 2024-06-11 MED ORDER — OXYCODONE HCL 5 MG PO TABS: 5.0000 mg | ORAL_TABLET | Freq: Four times a day (QID) | ORAL | 0 refills | Status: DC | PRN

## 2024-06-11 MED ORDER — DEXAMETHASONE SODIUM PHOSPHATE 10 MG/ML IJ SOLN
INTRAMUSCULAR | Status: DC | PRN
  Administered 2024-06-11: 10 mg via INTRAVENOUS

## 2024-06-11 MED ORDER — PROPOFOL 10 MG/ML IV BOLUS
INTRAVENOUS | Status: AC
Start: 2024-06-11 — End: 2024-06-11
  Filled 2024-06-11: qty 20

## 2024-06-11 MED ORDER — PHENYLEPHRINE 80 MCG/ML (10ML) SYRINGE FOR IV PUSH (FOR BLOOD PRESSURE SUPPORT)
PREFILLED_SYRINGE | INTRAVENOUS | Status: AC
  Filled 2024-06-11: qty 10

## 2024-06-11 MED ORDER — EPHEDRINE 5 MG/ML INJ
INTRAVENOUS | Status: AC
  Filled 2024-06-11: qty 5

## 2024-06-11 MED ORDER — DOCUSATE SODIUM 100 MG PO CAPS
100.0000 mg | ORAL_CAPSULE | Freq: Two times a day (BID) | ORAL | 2 refills | Status: AC
Start: 2024-06-11 — End: 2025-06-11

## 2024-06-11 MED ORDER — ROCURONIUM BROMIDE 10 MG/ML (PF) SYRINGE
PREFILLED_SYRINGE | INTRAVENOUS | Status: DC | PRN
  Administered 2024-06-11: 50 mg via INTRAVENOUS

## 2024-06-11 MED ORDER — PROPOFOL 10 MG/ML IV BOLUS
INTRAVENOUS | Status: DC | PRN
  Administered 2024-06-11: 100 mg via INTRAVENOUS

## 2024-06-11 MED ORDER — STERILE WATER FOR IRRIGATION IR SOLN
Status: DC | PRN
  Administered 2024-06-11: 500 mL

## 2024-06-11 MED ORDER — ONDANSETRON HCL 4 MG/2ML IJ SOLN
INTRAMUSCULAR | Status: AC
  Filled 2024-06-11: qty 2

## 2024-06-11 MED ORDER — BUPIVACAINE HCL (PF) 0.5 % IJ SOLN
INTRAMUSCULAR | Status: AC
  Filled 2024-06-11: qty 30

## 2024-06-11 MED ORDER — OXYCODONE HCL 5 MG PO TABS
5.0000 mg | ORAL_TABLET | Freq: Once | ORAL | Status: AC | PRN
  Administered 2024-06-11: 5 mg via ORAL
  Filled 2024-06-11: qty 1

## 2024-06-11 MED ORDER — LIDOCAINE 2% (20 MG/ML) 5 ML SYRINGE
INTRAMUSCULAR | Status: AC
  Filled 2024-06-11: qty 5

## 2024-06-11 MED ORDER — ONDANSETRON HCL 4 MG/2ML IJ SOLN
INTRAMUSCULAR | Status: DC | PRN
  Administered 2024-06-11: 4 mg via INTRAVENOUS

## 2024-06-11 MED ORDER — ROCURONIUM BROMIDE 10 MG/ML (PF) SYRINGE
PREFILLED_SYRINGE | INTRAVENOUS | Status: AC
  Filled 2024-06-11: qty 10

## 2024-06-11 MED ORDER — SCOPOLAMINE 1 MG/3DAYS TD PT72
1.0000 | MEDICATED_PATCH | Freq: Once | TRANSDERMAL | Status: DC
  Administered 2024-06-11: 1.5 mg via TRANSDERMAL
  Filled 2024-06-11: qty 1

## 2024-06-11 MED ORDER — SUCCINYLCHOLINE CHLORIDE 200 MG/10ML IV SOSY
PREFILLED_SYRINGE | INTRAVENOUS | Status: AC
  Filled 2024-06-11: qty 10

## 2024-06-11 MED ORDER — OXYCODONE HCL 5 MG/5ML PO SOLN: 5.0000 mg | Freq: Once | ORAL | Status: AC | PRN

## 2024-06-11 MED ORDER — CHLORHEXIDINE GLUCONATE CLOTH 2 % EX PADS
6.0000 | MEDICATED_PAD | Freq: Once | CUTANEOUS | Status: AC
  Administered 2024-06-11: 6 via TOPICAL

## 2024-06-11 MED ORDER — ACETAMINOPHEN 500 MG PO TABS: 1000.0000 mg | ORAL_TABLET | Freq: Four times a day (QID) | ORAL | 0 refills | Status: DC

## 2024-06-11 MED ORDER — SUCCINYLCHOLINE CHLORIDE 200 MG/10ML IV SOSY
PREFILLED_SYRINGE | INTRAVENOUS | Status: DC | PRN
  Administered 2024-06-11: 120 mg via INTRAVENOUS

## 2024-06-11 MED ORDER — INDOCYANINE GREEN 25 MG IV SOLR: 2.5000 mg | Freq: Once | INTRAVENOUS | Status: AC

## 2024-06-11 MED ORDER — DEXAMETHASONE SODIUM PHOSPHATE 10 MG/ML IJ SOLN
INTRAMUSCULAR | Status: AC
  Filled 2024-06-11: qty 1

## 2024-06-11 MED ORDER — EPHEDRINE SULFATE-NACL 50-0.9 MG/10ML-% IV SOSY
PREFILLED_SYRINGE | INTRAVENOUS | Status: DC | PRN
  Administered 2024-06-11 (×2): 10 mg via INTRAVENOUS
  Administered 2024-06-11: 5 mg via INTRAVENOUS

## 2024-06-11 MED ORDER — DEXAMETHASONE SODIUM PHOSPHATE 10 MG/ML IJ SOLN: 8.0000 mg | Freq: Once | INTRAMUSCULAR | Status: DC | PRN

## 2024-06-11 MED ORDER — SUGAMMADEX SODIUM 200 MG/2ML IV SOLN
INTRAVENOUS | Status: DC | PRN
  Administered 2024-06-11: 50 mg via INTRAVENOUS
  Administered 2024-06-11: 200 mg via INTRAVENOUS

## 2024-06-11 SURGICAL SUPPLY — 36 items
BLADE SURG 15 STRL LF DISP TIS (BLADE) ×1 IMPLANT
CAUTERY HOOK MNPLR 1.6 DVNC XI (INSTRUMENTS) ×1 IMPLANT
CHLORAPREP W/TINT 26 (MISCELLANEOUS) ×1 IMPLANT
CLIP LIGATING HEM O LOK PURPLE (MISCELLANEOUS) ×1 IMPLANT
COVER LIGHT HANDLE (MISCELLANEOUS) IMPLANT
DEFOGGER SCOPE WARMER CLEARIFY (MISCELLANEOUS) ×1 IMPLANT
DERMABOND ADVANCED .7 DNX12 (GAUZE/BANDAGES/DRESSINGS) ×1 IMPLANT
DRAPE ARM DVNC X/XI (DISPOSABLE) ×4 IMPLANT
DRAPE COLUMN DVNC XI (DISPOSABLE) ×1 IMPLANT
ELECTRODE REM PT RTRN 9FT ADLT (ELECTROSURGICAL) ×1 IMPLANT
FORCEPS BPLR R/ABLATION 8 DVNC (INSTRUMENTS) ×1 IMPLANT
FORCEPS PROGRASP DVNC XI (FORCEP) ×1 IMPLANT
GLOVE BIOGEL PI IND STRL 6.5 (GLOVE) ×2 IMPLANT
GLOVE BIOGEL PI IND STRL 7.0 (GLOVE) ×3 IMPLANT
GLOVE SURG SS PI 6.5 STRL IVOR (GLOVE) ×2 IMPLANT
GOWN STRL REUS W/TWL LRG LVL3 (GOWN DISPOSABLE) ×3 IMPLANT
GRASPER SUT TROCAR 14GX15 (MISCELLANEOUS) ×1 IMPLANT
KIT TURNOVER KIT A (KITS) ×1 IMPLANT
MANIFOLD NEPTUNE II (INSTRUMENTS) ×1 IMPLANT
NDL HYPO 21X1.5 SAFETY (NEEDLE) ×1 IMPLANT
NDL INSUFFLATION 14GA 120MM (NEEDLE) ×1 IMPLANT
NEEDLE HYPO 21X1.5 SAFETY (NEEDLE) ×1 IMPLANT
NEEDLE INSUFFLATION 14GA 120MM (NEEDLE) ×1 IMPLANT
OBTURATOR OPTICALSTD 8 DVNC (TROCAR) ×1 IMPLANT
PACK LAP CHOLE LZT030E (CUSTOM PROCEDURE TRAY) ×1 IMPLANT
PAD ARMBOARD POSITIONER FOAM (MISCELLANEOUS) ×1 IMPLANT
PENCIL HANDSWITCHING (ELECTRODE) ×1 IMPLANT
POSITIONER HEAD 8X9X4 ADT (SOFTGOODS) ×1 IMPLANT
SEAL UNIV 5-12 XI (MISCELLANEOUS) ×4 IMPLANT
SET BASIN LINEN APH (SET/KITS/TRAYS/PACK) ×1 IMPLANT
SET TUBE SMOKE EVAC HIGH FLOW (TUBING) ×1 IMPLANT
SUT MNCRL AB 4-0 PS2 18 (SUTURE) ×2 IMPLANT
SUT VICRYL 0 UR6 27IN ABS (SUTURE) ×1 IMPLANT
SYR 30ML LL (SYRINGE) ×1 IMPLANT
SYSTEM RETRIEVL 5MM INZII UNIV (BASKET) ×1 IMPLANT
WATER STERILE IRR 500ML POUR (IV SOLUTION) ×1 IMPLANT

## 2024-06-11 NOTE — Progress Notes (Signed)
 [facility]  Spoke with the patient's husband in the consultation room.  I explained that she tolerated the procedure without difficulty.  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  For this reason, I decided to abort her cholecystectomy to decrease the risk of severe complication.  I did perform a liver biopsy, and she will need to follow-up with GI postoperatively for management of her liver disease.  We discussed if she continues to have issues related to her gallbladder, she will likely need to go to a tertiary care facility for her cholecystectomy.  She has dissolvable stitches under the skin with overlying skin glue.  This will flake off in 10 to 14 days.  I discharged her home with a prescription for narcotic pain medication that they should take as needed for pain.  If they take the narcotic pain medication, they should take a stool softener as well.  The patient will follow-up with me in 2 weeks.  All questions were answered to his expressed satisfaction.  Diana Brittle, DO [facility] [address] [place] [address] [place], [place] [address] [phone] (office)

## 2024-06-11 NOTE — Discharge Instructions (Signed)
 Ambulatory Surgery Discharge Instructions  General Anesthesia or Sedation Do not drive or operate heavy machinery for 24 hours.  Do not consume alcohol, tranquilizers, sleeping medications, or any non-prescribed medications for 24 hours. Do not make important decisions or sign any important papers in the next 24 hours. You should have someone with you tonight at home.  Activity  You are advised to go directly home from the hospital.  Restrict your activities and rest for a day.  Resume light activity tomorrow. No heavy lifting over 10 lbs or strenuous exercise.  Fluids and Diet Begin with clear liquids, bouillon, dry toast, soda crackers.  If not nauseated, you may go to a regular diet when you desire.  Greasy and spicy foods are not advised.  Medications  If you have not had a bowel movement in 24 hours, take 2 tablespoons over the counter Milk of mag.             You May resume your blood thinners tomorrow (Aspirin, coumadin, or other).  You are being discharged with prescriptions for Opioid/Narcotic Medications: There are some specific considerations for these medications that you should know. Opioid Meds have risks & benefits. Addiction to these meds is always a concern with prolonged use Take medication only as directed Do not drive while taking narcotic pain medication Do not crush tablets or capsules Do not use a different container than medication was dispensed in Lock the container of medication in a cool, dry place out of reach of children and pets. Opioid medication can cause addiction Do not share with anyone else (this is a felony) Do not store medications for future use. Dispose of them properly.     Disposal:  Find a [employer] household drug take back site near you.  If you can't get to a drug take back site, use the recipe below as a last resort to dispose of expired, unused or unwanted drugs. Disposal  (Do not dispose chemotherapy drugs this way, talk to your  prescribing doctor instead.) Step 1: Mix drugs (do not crush) with dirt, kitty litter, or used coffee grounds and add a small amount of water to dissolve any solid medications. Step 2: Seal drugs in plastic bag. Step 3: Place plastic bag in trash. Step 4: Take prescription container and scratch out personal information, then recycle or throw away.  Operative Site  You have a liquid bandage over your incisions, this will begin to flake off in about a week. Ok to English as a second language teacher. Keep wound clean and dry. No baths or swimming. No lifting more than 10 pounds.  Contact Information: If you have questions or concerns, please call our office, [phone], Monday- Thursday 8AM-5PM and Friday 8AM-12Noon.  If it is after hours or on the weekend, please call [facility] Main Number, [phone], and ask to speak to the surgeon on call for Dr. Robyne Peers at [facility].   SPECIFIC COMPLICATIONS TO WATCH FOR: Inability to urinate Fever over 101? F by mouth Nausea and vomiting lasting longer than 24 hours. Pain not relieved by medication ordered Swelling around the operative site Increased redness, warmth, hardness, around operative area Numbness, tingling, or cold fingers or toes Blood -soaked dressing, (small amounts of oozing may be normal) Increasing and progressive drainage from surgical area or exam site

## 2024-06-11 NOTE — Anesthesia Preprocedure Evaluation (Addendum)
 Anesthesia Evaluation  Patient identified by MRN, date of birth, ID band Patient awake    Reviewed: Allergy & Precautions, H&P , NPO status , Patient's Chart, lab work & pertinent test results  History of Anesthesia Complications (+) PONV and history of anesthetic complications  Airway Mallampati: II  TM Distance: >3 FB Neck ROM: Full    Dental no notable dental hx.    Pulmonary shortness of breath, asthma , pneumonia, COPD, former smoker   Pulmonary exam normal breath sounds clear to auscultation       Cardiovascular hypertension, Normal cardiovascular exam Rhythm:Regular Rate:Normal     Neuro/Psych  PSYCHIATRIC DISORDERS Anxiety Depression     Neuromuscular disease    GI/Hepatic Neg liver ROS,GERD  ,,  Endo/Other  diabetes    Renal/GU negative Renal ROS  negative genitourinary   Musculoskeletal negative musculoskeletal ROS (+)    Abdominal   Peds negative pediatric ROS (+)  Hematology negative hematology ROS (+)   Anesthesia Other Findings   Reproductive/Obstetrics negative OB ROS                              Anesthesia Physical Anesthesia Plan  ASA: 3  Anesthesia Plan: General   Post-op Pain Management:    Induction: Intravenous and Cricoid pressure planned  PONV Risk Score and Plan:   Airway Management Planned: Oral ETT  Additional Equipment:   Intra-op Plan:   Post-operative Plan: Extubation in OR  Informed Consent: I have reviewed the patients History and Physical, chart, labs and discussed the procedure including the risks, benefits and alternatives for the proposed anesthesia with the patient or authorized representative who has indicated his/her understanding and acceptance.     Dental advisory given  Plan Discussed with: CRNA  Anesthesia Plan Comments:         Anesthesia Quick Evaluation

## 2024-06-11 NOTE — Anesthesia Procedure Notes (Signed)
 Procedure Name: Intubation Date/Time: 06/11/2024 8:35 AM  Performed by: Elaine Delon CROME, CRNAPre-anesthesia Checklist: Patient identified, Emergency Drugs available, Suction available and Patient being monitored Patient Re-evaluated:Patient Re-evaluated prior to induction Oxygen Delivery Method: Circle system utilized Preoxygenation: Pre-oxygenation with 100% oxygen Induction Type: IV induction, Rapid sequence and Cricoid Pressure applied Laryngoscope Size: Mac and 3 Grade View: Grade I Tube type: Oral Tube size: 7.0 mm Number of attempts: 1 Airway Equipment and Method: Stylet Placement Confirmation: ETT inserted through vocal cords under direct vision, positive ETCO2 and breath sounds checked- equal and bilateral Secured at: 21 cm Tube secured with: Tape Dental Injury: Teeth and Oropharynx as per pre-operative assessment

## 2024-06-11 NOTE — Anesthesia Postprocedure Evaluation (Signed)
 Anesthesia Post Note  Patient: Diana Pratt  Procedure(s) Performed: CHOLECYSTECTOMY, ROBOT-ASSISTED, LAPAROSCOPIC; LIVER BIOPSY (Abdomen)  Patient location during evaluation: PACU Anesthesia Type: General Level of consciousness: awake and alert Pain management: pain level controlled Vital Signs Assessment: post-procedure vital signs reviewed and stable Respiratory status: spontaneous breathing, nonlabored ventilation, respiratory function stable and patient connected to nasal cannula oxygen Cardiovascular status: blood pressure returned to baseline and stable Postop Assessment: no apparent nausea or vomiting Anesthetic complications: no   No notable events documented.   Last Vitals:  Vitals:   06/11/24 1028 06/11/24 1033  BP:  114/80  Pulse: 72 67  Resp: 14 15  Temp:  36.4 C  SpO2: 93% 94%    Last Pain:  Vitals:   06/11/24 1033  TempSrc: Oral  PainSc: 5                  Andrea Limes

## 2024-06-11 NOTE — Op Note (Signed)
 [facility] Operative Note  06/11/24  Preoperative Diagnosis: Symptomatic Cholelithiasis   Postoperative Diagnosis: Symptomatic cholelithiasis, liver cirrhosis   Procedure(s) Performed: Robotic Assisted Laparoscopic Liver Biopsy   Surgeon: Dorothyann Brittle, DO   Assistants: No qualified resident was available    Anesthesia: General endotracheal   Anesthesiologist: Dr. Herschell  Specimens: Liver biopsy   Estimated Blood Loss: Minimal   Blood Replacement: None    Complications: None   Wound Class: Clean   Operative Indications: The patient was found to have cholelithiasis on imaging and was symptomatic.  We discussed the risk of the procedure including but not limited to bleeding, infection, injury to the common bile duct, bile leak, need for further procedures, chance of subtotal cholecystectomy.   Findings:  Liver Cirrhosis Aborted cholecystectomy given significant liver disease Adequate hemostasis    Procedure: Firefly was given in the preoperative area.  The patient was taken to the operating room and placed supine. General endotracheal anesthesia was induced. Intravenous antibiotics were administered per protocol.  An orogastric tube positioned to decompress the stomach. The abdomen was prepared and draped in the usual sterile fashion. A time-out was completed verifying correct patient, procedure, site, positioning, and implant(s) and/or special equipment prior to beginning this procedure.  Veress needle was placed at the infraumbilical area and insufflation was started after confirming a positive saline drop test and no immediate increase in abdominal pressure.  After reaching 15 mm, the Veress needle was removed and a 8 mm port was placed via optiview technique infraumbilical, measuring 20 mm away from the suspected position of the gallbladder.  The abdomen was inspected and no abnormalities or injuries were found.  Under direct vision, ports were placed  in the following locations in a semi curvilinear position around the target of the gallbladder: Two 8 mm ports on the patient's right each having 8cm clearance to the adjacent ports and one 8 mm port placed on the patient's left 8 cm from the umbilical port. Once ports were placed, the table was placed in the reverse Trendelenburg position with the right side up. The Xi platform was brought into the operative field and docked to the ports successfully.  An endoscope was placed through the umbilical port, prograsp through the most lateral right port, fenestrated bipolar to the port just right of the umbilicus, and then a hook cautery in the left port.  Upon entering the abdomen and positioning the patient, the liver was noted to be significantly nodular and concerning for liver cirrhosis.  The gallbladder was noted to be distended without significant inflammatory changes, and would light up with ICG.  Dr. Mavis was Facetimed during the case and agreed that the safest option for the patient was abort the procedure.  Given the severe appearance of the liver, decision was made to not perform cholecystectomy at this time.  Photodocumentation was obtained.  Using hook electrocautery, a small portion of the liver was wedge biopsy.  Liver biopsy was placed in a 5 mm Endo Catch bag.  Hemostasis was noted at the liver edge.  The abdomen was desufflated, and trocars were removed under direct vision.  No bleeding was noted.  Incisions were localized with Marcaine .  All skin incisions were closed with subcuticular sutures of a 4-0 Monocryl and Dermabond.  Final inspection revealed acceptable hemostasis. All counts were correct at the end of the case. The patient was awakened from anesthesia and extubated without complication. The OG tube was removed.  The patient went to the PACU  in stable condition.   Dorothyann Brittle, DO [facility] [address] [place], [place]  [id number] [phone] (office)

## 2024-06-11 NOTE — Interval H&P Note (Signed)
 History and Physical Interval Note:  06/11/2024 8:11 AM  Diana Pratt  has presented today for surgery, with the diagnosis of CHOLELITHIASIS.  The various methods of treatment have been discussed with the patient and family. After consideration of risks, benefits and other options for treatment, the patient has consented to  Procedure(s): CHOLECYSTECTOMY, ROBOT-ASSISTED, LAPAROSCOPIC (N/A) as a surgical intervention.  The patient's history has been reviewed, patient examined, no change in status, stable for surgery.  I have reviewed the patient's chart and labs.  Questions were answered to the patient's satisfaction.     Romaldo Saville A Rayne Loiseau

## 2024-06-11 NOTE — Transfer of Care (Signed)
 Immediate Anesthesia Transfer of Care Note  Patient: Diana Pratt  Procedure(s) Performed: BIOPSY, LIVER, LAPAROSCOPIC (Abdomen)  Patient Location: PACU  Anesthesia Type:General  Level of Consciousness: drowsy  Airway & Oxygen Therapy: Patient Spontanous Breathing and Patient connected to face mask oxygen  Post-op Assessment: Report given to RN and Post -op Vital signs reviewed and stable  Post vital signs: Reviewed and stable  Last Vitals:  Vitals Value Taken Time  BP 129/52   Temp 97.8   Pulse 79   Resp 16 06/11/24 09:47  SpO2 94%   Vitals shown include unfiled device data.  Last Pain:  Vitals:   06/11/24 0719  TempSrc: Oral  PainSc: 5       Patients Stated Pain Goal: 6 (06/11/24 0719)  Complications: No notable events documented.

## 2024-06-12 ENCOUNTER — Ambulatory Visit: Admitting: Internal Medicine

## 2024-06-12 LAB — SURGICAL PATHOLOGY

## 2024-06-17 ENCOUNTER — Encounter: Payer: Self-pay | Admitting: Internal Medicine

## 2024-06-17 ENCOUNTER — Ambulatory Visit (INDEPENDENT_AMBULATORY_CARE_PROVIDER_SITE_OTHER): Admitting: Internal Medicine

## 2024-06-17 VITALS — BP 167/77 | HR 87 | Temp 97.5°F | Ht 64.0 in | Wt 198.1 lb

## 2024-06-17 DIAGNOSIS — K7469 Other cirrhosis of liver: Secondary | ICD-10-CM | POA: Diagnosis not present

## 2024-06-17 DIAGNOSIS — K7460 Unspecified cirrhosis of liver: Secondary | ICD-10-CM | POA: Diagnosis not present

## 2024-06-17 DIAGNOSIS — R1011 Right upper quadrant pain: Secondary | ICD-10-CM | POA: Diagnosis not present

## 2024-06-17 DIAGNOSIS — K8020 Calculus of gallbladder without cholecystitis without obstruction: Secondary | ICD-10-CM | POA: Diagnosis not present

## 2024-06-17 DIAGNOSIS — K219 Gastro-esophageal reflux disease without esophagitis: Secondary | ICD-10-CM

## 2024-06-17 NOTE — Progress Notes (Addendum)
 Referring Provider: Jolinda Norene HERO, DO Primary Care Physician:  Jolinda Norene HERO, DO Primary GI:  Dr. Cindie  Chief Complaint  Patient presents with   Follow-up    Patient here today for a follow up on Cirrhosis per Dr. Evonnie he had to abort gall bladder surgery due to extensive cirrhosis.     HPI:   Diana Pratt is a 78 y.o. female who presents to clinic today for follow-up visit. Previously seen 05/12/2024.  History of right upper quadrant pain and cholelithiasis.  Subsequent workup:  CT abdomen pelvis without contrast 04/30/2024 with questionable early cirrhotic change of the liver, distended gallbladder with cholelithiasis.  RUQ US  04/30/2024 cholelithiasis, mildly dilated CBD.  MRI/MRCP 05/18/2024 with cholelithiasis, otherwise unremarkable.  Underwent robotic cholecystectomy 06/11/2024.  Upon insufflation of the abdomen was found to have a cirrhotic appearing liver. Cholecystectomy aborted given concern for underlying cirrhosis.  Liver biopsies were performed (see below)  A. LIVER, BIOPSY:       Subcapsular sampled hepatic parenchyma with bridging fibrosis and  cirrhosis.      See comment.   COMMENT:   The specimen is composed of multiple fragments of subcapsular sampled  hepatic parenchyma. The subcapsular sampling may overestimate the degree  of fibrosis.   If the subcapsular sampling represent the hepatic parenchymal histology.  The current biopsy shows well-formed bridging fibrosis and cirrhosis  with etiology not apparent histologically. Clinical correlation is  recommended   Today, continues to complain of right upper quadrant discomfort.  Denies any lower extremity swelling or abdominal swelling.  No confusion or brain fog.  Denies any melena or hematochezia.   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 jayson acton   Depression    Diabetes mellitus    Diabetic neuropathy (HCC)    Gallstones 04/27/2024   GERD (gastroesophageal reflux disease)    occ heartburn   Humerus fracture    right   Hyperlipemia    Hypertension    Interstitial cystitis    Osteoporosis    Pneumonia    15 yrs ago   PONV (postoperative nausea and vomiting)    Shortness of breath dyspnea     Past Surgical History:  Procedure Laterality Date   ABDOMINAL HYSTERECTOMY  03/1984   partial   COLONOSCOPY N/A 07/19/2014   Procedure: COLONOSCOPY;  Surgeon: Margo LITTIE Haddock, MD;  Location: [facility];  Service: Endoscopy;  Laterality: N/A;  9:30   REVERSE SHOULDER ARTHROPLASTY Right 06/14/2016   Procedure: REVERSE SHOULDER ARTHROPLASTY;  Surgeon: Franky Pointer, MD;  Location: [facility] OR;  Service: Orthopedics;  Laterality: Right;   Skin cancer removed  08/07/2016   left ear    Current Outpatient Medications  Medication Sig Dispense Refill   Accu-Chek Softclix Lancets lancets Check blood sugar 3 times daily Dx E11.40 300 each 3   albuterol  (PROAIR  HFA) 108 (90 Base) MCG/ACT inhaler Inhale 2 puffs into the lungs every 6 (six) hours as needed for shortness of breath. 54 g 2   amLODipine  (NORVASC ) 10 MG tablet Take 1 tablet (10 mg total) by mouth daily. 90 tablet 3   blood glucose meter kit and supplies Dispense based on patient and insurance preference. Use up to four times daily as directed. (FOR ICD-10 E10.9,  E11.9). 1 each 0   Blood Glucose Monitoring Suppl (ACCU-CHEK GUIDE) w/Device KIT Use to check blood sugar up to TID and as needed. DX E11.49 1 kit 0   budesonide -formoterol  (SYMBICORT ) 160-4.5 MCG/ACT inhaler Inhale 2 puffs into the lungs 2 (two) times daily. (Patient taking differently: Inhale 2 puffs into the lungs 2 (two) times daily. As needed) 3 each 4   calcium  carbonate (TUMS - DOSED IN MG ELEMENTAL CALCIUM ) 500 MG chewable tablet Chew 3 tablets by mouth daily.     Cholecalciferol  (VITAMIN D3) 2000 UNITS capsule Take 2,000 Units by mouth daily.  60 capsule 11   conjugated estrogens  (PREMARIN ) vaginal cream APPLY ONE-HALF GRAM (ONE-FOURTH APPLICATORFUL) VAGINALLY THREE TIMES A  WEEK (Patient taking differently: Place 1 applicator vaginally daily as needed (atrophy).) 30 g 3   Continuous Glucose Receiver (DEXCOM G7 RECEIVER) DEVI UAD to continuously check BGs. E11.69 1 each 0   Continuous Glucose Sensor (DEXCOM G7 SENSOR) MISC Check BGs continuosly. Change every 10 days. E11.69 9 each 3   docusate sodium  (COLACE) 100 MG capsule Take 1 capsule (100 mg total) by mouth 2 (two) times daily. 60 capsule 2   Evolocumab  (REPATHA  SURECLICK) 140 MG/ML SOAJ Inject 140 mg into the skin every 14 (fourteen) days. 6 mL 4   FLUoxetine  (PROZAC ) 10 MG capsule Take 3 capsules (30 mg total) by mouth daily. 270 capsule 3   fluticasone (FLONASE) 50 MCG/ACT nasal spray Place 2 sprays into both nostrils daily.     glucose blood (ACCU-CHEK GUIDE) test strip Use to check BG up to tid.  Dx: type 2 diabetes requiring insulin  therapy E11.40 300 each 12   hydrochlorothiazide  (HYDRODIURIL ) 25 MG tablet Take 1 tablet (25 mg total) by mouth daily. 90 tablet 3   hydroxypropyl methylcellulose (ISOPTO TEARS) 2.5 % ophthalmic solution Place 1 drop into both eyes daily as needed for dry eyes.     insulin  glargine (LANTUS  SOLOSTAR) 100 UNIT/ML Solostar Pen Inject 32 units in the morning and 23 units in the evening. (Patient taking differently: Inject 42 Units into the skin See admin instructions. Inject 42 units in the morning and 17 units in the evening.) 60 mL 3   Insulin  Pen Needle (PEN NEEDLES) 31G X 6 MM MISC Use to administer insulin  once qid. Dx E11.69 Use Leader brand 100 each 3   loratadine  (CLARITIN ) 10 MG tablet Take 1 tablet (10 mg total) by mouth daily. For allergies/ drainage 90 tablet 3   LORazepam  (ATIVAN ) 1 MG tablet Take 1/2-1 tablet daily if needed for anxiety.  May repeat dose once if needed during  the day. PUT ON FILE 45 tablet 5   metoprolol  succinate (TOPROL -XL) 100 MG 24 hr tablet Take 1 tablet (100 mg total) by mouth daily. Take with or immediately following a meal. 90 tablet 3   Multiple Vitamins-Minerals (CENTRUM SILVER PO) Take 1 tablet by mouth daily.     omeprazole  (PRILOSEC) 20 MG capsule Take 1 capsule (20 mg total) by mouth daily. 90 capsule 3   oxybutynin  (DITROPAN  XL) 5 MG 24 hr tablet Take 1 tablet (5 mg total) by mouth at bedtime. For bladder 90 tablet 0   oxyCODONE  (ROXICODONE ) 5 MG immediate release tablet Take 1 tablet (5 mg total) by mouth every 6 (six) hours as needed. 8 tablet 0   Semaglutide , 1 MG/DOSE, (OZEMPIC , 1 MG/DOSE,) 4 MG/3ML SOPN INJECT 1 MG SUBCUTANEOUSLY ONCE A WEEK - (ADMINISTER BY SUBCUTANEOUS INJECTION INTO THE ABDOMEN, THIGH, OR UPPER ARM  AT ANY TIME OF DAY ON THE SAME DAY EACH WEEK) DISCARD PEN 56 DAYS AFTER FIRST USE 9 mL 3   tiZANidine  (ZANAFLEX ) 4 MG tablet Take 0.5-1 tablets (2-4 mg total) by mouth every 8 (eight) hours as needed for muscle spasms. 30 tablet 0   valsartan  (DIOVAN ) 320 MG tablet Take 1 tablet (320 mg total) by mouth daily. 90 tablet 3   Vitamin D , Ergocalciferol , (DRISDOL ) 1.25 MG (50000 UNIT) CAPS capsule Take 1 capsule (50,000 Units total) by mouth every 7 (seven) days. 12 capsule 3   No current facility-administered medications for this visit.    Allergies as of 06/17/2024 - Review Complete 06/17/2024  Allergen Reaction Noted   Crestor  [rosuvastatin ]  06/05/2017   Lipitor [atorvastatin ] Other (See Comments) 06/08/2013   Livalo  [pitavastatin ] Other (See Comments) 12/09/2014   Morphine  and codeine Itching 06/02/2011   Penicillins Hives and Itching 02/22/2011   Simvastatin Other (See Comments) 02/22/2011   Sulfa antibiotics Hives and Itching 02/22/2011   Zetia [ezetimibe] Cough 02/22/2011    Family History  Problem Relation Age of Onset   Parkinsonism Mother    Dementia Mother    Colon cancer Father 32       MULTIPLE  CO-MORBIDITIES   Cancer Father    Diabetes Paternal Aunt    Diabetes Paternal Uncle    Diabetes Paternal Grandmother    Congestive Heart Failure Paternal Grandfather    Congestive Heart Failure Maternal Grandmother    Stroke Maternal Grandfather    Colon polyps Neg Hx     Social History   Socioeconomic History   Marital status: Married    Spouse name: [name]   Number of children: 1   Years of education: 13   Highest education level: Some college, no degree  Occupational History   Occupation: retired     Comment: CNA   Tobacco Use   Smoking status: Former    Current packs/day: 0.00    Average packs/day: 0.5 packs/day for 34.0 years (17.0 ttl pk-yrs)    Types: Cigarettes    Start date: 06/01/1960    Quit date: 06/01/1994    Years since quitting: 30.0   Smokeless tobacco: Never  Vaping Use   Vaping status: Never Used  Substance and Sexual Activity   Alcohol use: No   Drug use: No   Sexual activity: Yes    Birth control/protection: Surgical    Comment: hyst  Other Topics Concern   Not on file  Social History Narrative   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[place] [employer]KIDS [employer]INKS SOCIALLY   Social Drivers of Corporate investment banker Strain: Low Risk  (08/15/2023)   Overall Financial Resource Strain (CARDIA)    Difficulty of Paying Living Expenses: Not hard at all  Food Insecurity: No Food Insecurity (08/15/2023)   Hunger Vital Sign    Worried About Running Out of Food in the Last Year: Never true    Ran Out of Food in the Last Year: Never true  Transportation Needs: No Transportation Needs (08/15/2023)   PRAPARE - Administrator, Civil Service (Medical): No    Lack of Transportation (Non-Medical): No  Physical Activity: Inactive (08/15/2023)   Exercise Vital Sign    Days of Exercise per Week: 0 days    Minutes of Exercise per Session: 0 min  Stress: No Stress Concern Present (08/15/2023)   [employer] of Occupational  Health - Occupational Stress Questionnaire    Feeling of Stress :  Not at all  Social Connections: Moderately Isolated (08/15/2023)   Social Connection and Isolation Panel    Frequency of Communication with Friends and Family: More than three times a week    Frequency of Social Gatherings with Friends and Family: More than three times a week    Attends Religious Services: Never    Database administrator or Organizations: No    Attends Banker Meetings: Never    Marital Status: Married    Subjective: Review of Systems  Constitutional:  Negative for chills and fever.  HENT:  Negative for congestion and hearing loss.   Eyes:  Negative for blurred vision and double vision.  Respiratory:  Negative for cough and shortness of breath.   Cardiovascular:  Negative for chest pain and palpitations.  Gastrointestinal:  Negative for abdominal pain, blood in stool, constipation, diarrhea, heartburn, melena and vomiting.  Genitourinary:  Negative for dysuria and urgency.  Musculoskeletal:  Negative for joint pain and myalgias.  Skin:  Negative for itching and rash.  Neurological:  Negative for dizziness and headaches.  Psychiatric/Behavioral:  Negative for depression. The patient is not nervous/anxious.      Objective: BP (!) 150/60 (BP Location: Left Arm, Patient Position: Sitting, Cuff Size: Large)   Pulse 80   Temp (!) 97.5 F (36.4 C) (Temporal)   Ht 5' 4 (1.626 m)   Wt 198 lb 1.6 oz (89.9 kg)   BMI 34.00 kg/m  Physical Exam Constitutional:      Appearance: Normal appearance.  HENT:     Head: Normocephalic and atraumatic.  Eyes:     Extraocular Movements: Extraocular movements intact.     Conjunctiva/sclera: Conjunctivae normal.  Cardiovascular:     Rate and Rhythm: Normal rate and regular rhythm.  Pulmonary:     Effort: Pulmonary effort is normal.     Breath sounds: Normal breath sounds.  Abdominal:     General: Bowel sounds are normal.     Palpations: Abdomen is  soft.  Musculoskeletal:        General: No swelling. Normal range of motion.     Cervical back: Normal range of motion and neck supple.  Skin:    General: Skin is warm and dry.     Coloration: Skin is not jaundiced.  Neurological:     General: No focal deficit present.     Mental Status: She is alert and oriented to person, place, and time.  Psychiatric:        Mood and Affect: Mood normal.        Behavior: Behavior normal.      Assessment/Plan:  1.  Cirrhosis-new diagnosis based on gross description of liver as well as liver biopsy (though subscapular).  Discussed in depth with patient today, though her biopsies were subscapular, I think it is safe to presume she likely does have early, well compensated cirrhosis.  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  No history of alcohol use.   Will check MELD labs today.  HCC screening: MRI/MRCP 05/18/2024 without evidence of hepatoma.  Will plan on right upper quadrant ultrasound January 2026.  No issues with hypervolemia or ascites.  Continue to monitor.  Variceal screening: Most recent platelet count 233k.  Will hold off on screening endoscopy for now.  Continue to monitor.  Nutrition recommendations:  High-protein diet from a primarily plant-based diet. Avoid red meat.  No raw or undercooked meat, seafood, or shellfish. Low-fat/cholesterol/carbohydrate diet. Limit sodium to no more than  2000 mg/day including everything that you eat and drink. Recommend at least 30 minutes of aerobic and resistance exercise 3 days/week.  2.  Right upper quadrant pain, cholelithiasis-discussed with general surgery team.  Recommended referral to hepatobiliary to discuss cholecystectomy given her newfound cirrhosis.  Will make referral today.  3.  Chronic GERD-well-controlled on omeprazole  daily.  Will continue.  Follow-up in 3 months.   06/17/2024 3:44 PM   Disclaimer: This note was dictated with voice  recognition software. Similar sounding words can inadvertently be transcribed and may not be corrected upon review.

## 2024-06-17 NOTE — Patient Instructions (Signed)
 I am going to order blood work today at [facility] to further evaluate your cirrhosis.  Will call with results.  I am going to refer you to hepatobiliary surgeon in [place] to further discuss having your gallbladder removed.  Follow-up in 3 months.  It was very nice meeting all of you today.  Dr. Cindie

## 2024-06-18 LAB — COMPREHENSIVE METABOLIC PANEL WITH GFR
ALT: 28 IU/L (ref 0–32)
AST: 19 IU/L (ref 0–40)
Albumin: 4.1 g/dL (ref 3.8–4.8)
Alkaline Phosphatase: 77 IU/L (ref 44–121)
BUN/Creatinine Ratio: 22 (ref 12–28)
BUN: 17 mg/dL (ref 8–27)
Bilirubin Total: 0.4 mg/dL (ref 0.0–1.2)
CO2: 21 mmol/L (ref 20–29)
Calcium: 8.9 mg/dL (ref 8.7–10.3)
Chloride: 106 mmol/L (ref 96–106)
Creatinine, Ser: 0.76 mg/dL (ref 0.57–1.00)
Globulin, Total: 2.3 g/dL (ref 1.5–4.5)
Glucose: 73 mg/dL (ref 70–99)
Potassium: 4.4 mmol/L (ref 3.5–5.2)
Sodium: 143 mmol/L (ref 134–144)
Total Protein: 6.4 g/dL (ref 6.0–8.5)
eGFR: 80 mL/min/1.73 (ref >59)

## 2024-06-18 LAB — PROTIME-INR
INR: 1 (ref 0.9–1.2)
Prothrombin Time: 10.9 s (ref 9.1–12.0)

## 2024-06-22 ENCOUNTER — Encounter: Payer: Self-pay | Admitting: *Deleted

## 2024-06-22 ENCOUNTER — Other Ambulatory Visit: Payer: Self-pay | Admitting: *Deleted

## 2024-06-22 DIAGNOSIS — K8020 Calculus of gallbladder without cholecystitis without obstruction: Secondary | ICD-10-CM

## 2024-07-01 ENCOUNTER — Ambulatory Visit: Admitting: Surgery

## 2024-07-07 ENCOUNTER — Other Ambulatory Visit: Payer: Self-pay | Admitting: General Surgery

## 2024-07-07 DIAGNOSIS — K7460 Unspecified cirrhosis of liver: Secondary | ICD-10-CM

## 2024-07-07 DIAGNOSIS — K8010 Calculus of gallbladder with chronic cholecystitis without obstruction: Secondary | ICD-10-CM | POA: Diagnosis not present

## 2024-07-09 ENCOUNTER — Ambulatory Visit
Admission: RE | Admit: 2024-07-09 | Discharge: 2024-07-09 | Disposition: A | Discharge status: Home / Self Care | Source: Ambulatory Visit | Attending: General Surgery | Admitting: General Surgery

## 2024-07-09 DIAGNOSIS — K760 Fatty (change of) liver, not elsewhere classified: Secondary | ICD-10-CM | POA: Diagnosis not present

## 2024-07-09 DIAGNOSIS — K7460 Unspecified cirrhosis of liver: Secondary | ICD-10-CM

## 2024-07-10 ENCOUNTER — Other Ambulatory Visit: Payer: Self-pay | Admitting: Urology

## 2024-07-17 NOTE — Progress Notes (Signed)
 Surgical Instructions   Your procedure is scheduled on Thursday July 23, 2024. Report to [facility] Main Entrance A at 11:00 A.M., then check in with the Admitting office. Any questions or running late day of surgery: call [phone]  Questions prior to your surgery date: call [phone], Monday-Friday, 8am-4pm. If you experience any cold or flu symptoms such as cough, fever, chills, shortness of breath, etc. between now and your scheduled surgery, please notify us  at the above number.     Remember:  Do not eat after midnight the night before your surgery   You may drink clear liquids until 10:00 the morning of your surgery.   Clear liquids allowed are: Water , Non-Citrus Juices (without pulp), Carbonated Beverages, Clear Tea (no milk, honey, etc.), Black Coffee Only (NO MILK, CREAM OR POWDERED CREAMER of any kind), and Gatorade.  Patient Instructions  The night before surgery:  No food after midnight. ONLY clear liquids after midnight  The day of surgery (if you have diabetes): Drink ONE (1) 12 oz G2 given to you in your pre admission testing appointment by 10:00 the morning of surgery. Drink in one sitting. Do not sip.  This drink was given to you during your hospital  pre-op appointment visit.  Nothing else to drink after completing the  12 oz bottle of G2.         If you have questions, please contact your surgeon's office.    Take these medicines the morning of surgery with A SIP OF WATER   amLODipine  (NORVASC )  FLUoxetine  (PROZAC )  fluticasone (FLONASE)  loratadine  (CLARITIN )  metoprolol  succinate (TOPROL -XL)  omeprazole  (PRILOSEC)   May take these medicines IF NEEDED: albuterol  (PROAIR  HFA) 108 (90 Base) MCG/ACT inhaler Please bring with you to the hospital hydroxypropyl methylcellulose (ISOPTO TEARS) 2.5 % ophthalmic solution  LORazepam  (ATIVAN )   One week prior to surgery, STOP taking any Aspirin  (unless otherwise instructed by your surgeon) Aleve,  Naproxen, Ibuprofen, Motrin, Advil, Goody's, BC's, all herbal medications, fish oil, and non-prescription vitamins.   WHAT DO I DO ABOUT MY DIABETES MEDICATION?   DO NOT TAKE YOUR Semaglutide , 1 MG/DOSE, (OZEMPIC ) 7 DAYS PRIOR TO SURGERY WITH THE LAST DOSE BEING NO LATER THAN 07/15/2024.  THE NIGHT BEFORE SURGERY TAKE 9 UNITS OF YOUR insulin  glargine (LANTUS  SOLOSTAR), WHICH IS 50% OF YOUR REGULAR DOSE.   THE MORNING OF SURGERY TAKE 16 UNITS OF YOUR insulin  glargine (LANTUS  SOLOSTAR), WHICH IS 50% OF YOUR REGULAR DOSE.   The day of surgery, do not take other diabetes injectables, including Byetta (exenatide), Bydureon (exenatide ER), Victoza (liraglutide), or Trulicity (dulaglutide).  If your CBG is greater than 220 mg/dL, you may take  of your sliding scale (correction) dose of insulin .   HOW TO MANAGE YOUR DIABETES BEFORE AND AFTER SURGERY  Why is it important to control my blood sugar before and after surgery? Improving blood sugar levels before and after surgery helps healing and can limit problems. A way of improving blood sugar control is eating a healthy diet by:  Eating less sugar and carbohydrates  Increasing activity/exercise  Talking with your doctor about reaching your blood sugar goals High blood sugars (greater than 180 mg/dL) can raise your risk of infections and slow your recovery, so you will need to focus on controlling your diabetes during the weeks before surgery. Make sure that the doctor who takes care of your diabetes knows about your planned surgery including the date and location.  How do I manage my blood sugar before  surgery? Check your blood sugar at least 4 times a day, starting 2 days before surgery, to make sure that the level is not too high or low.  Check your blood sugar the morning of your surgery when you wake up and every 2 hours until you get to the Short Stay unit.  If your blood sugar is less than 70 mg/dL, you will need to treat for low blood  sugar: Do not take insulin . Treat a low blood sugar (less than 70 mg/dL) with  cup of clear juice (cranberry or apple), 4 glucose tablets, OR glucose gel. Recheck blood sugar in 15 minutes after treatment (to make sure it is greater than 70 mg/dL). If your blood sugar is not greater than 70 mg/dL on recheck, call [phone] for further instructions. Report your blood sugar to the short stay nurse when you get to Short Stay.  If you are admitted to the hospital after surgery: Your blood sugar will be checked by the staff and you will probably be given insulin  after surgery (instead of oral diabetes medicines) to make sure you have good blood sugar levels. The goal for blood sugar control after surgery is 80-180 mg/dL.                      Do NOT Smoke (Tobacco/Vaping) for 24 hours prior to your procedure.  If you use a CPAP at night, you may bring your mask/headgear for your overnight stay.   You will be asked to remove any contacts, glasses, piercing's, hearing aid's, dentures/partials prior to surgery. Please bring cases for these items if needed.    Patients discharged the day of surgery will not be allowed to drive home, and someone needs to stay with them for 24 hours.  SURGICAL WAITING ROOM VISITATION Patients may have no more than 2 support people in the waiting area - these visitors may rotate.   Pre-op nurse will coordinate an appropriate time for 1 ADULT support person, who may not rotate, to accompany patient in pre-op.  Children under the age of 4 must have an adult with them who is not the patient and must remain in the main waiting area with an adult.  If the patient needs to stay at the hospital during part of their recovery, the visitor guidelines for inpatient rooms apply.  Please refer to the [facility] website for the visitor guidelines for any additional information.   If you received a COVID test during your pre-op visit  it is requested that you wear a mask  when out in public, stay away from anyone that may not be feeling well and notify your surgeon if you develop symptoms. If you have been in contact with anyone that has tested positive in the last 10 days please notify you surgeon.      Pre-operative CHG Bathing Instructions   You can play a key role in reducing the risk of infection after surgery. Your skin needs to be as free of germs as possible. You can reduce the number of germs on your skin by washing with CHG (chlorhexidine  gluconate) soap before surgery. CHG is an antiseptic soap that kills germs and continues to kill germs even after washing.   DO NOT use if you have an allergy to chlorhexidine /CHG or antibacterial soaps. If your skin becomes reddened or irritated, stop using the CHG and notify one of our RNs at [phone].  TAKE A SHOWER THE NIGHT BEFORE SURGERY AND THE DAY OF SURGERY    Please keep in mind the following:  DO NOT shave, including legs and underarms, 48 hours prior to surgery.   Place clean sheets on your bed the night before surgery Use a clean washcloth (not used since being washed) for each shower. DO NOT sleep with pet's night before surgery.  CHG Shower Instructions:  Wash your face and private area with normal soap. If you choose to wash your hair, wash first with your normal shampoo.  After you use shampoo/soap, rinse your hair and body thoroughly to remove shampoo/soap residue.  Turn the water  OFF and apply half the bottle of CHG soap to a CLEAN washcloth.  Apply CHG soap ONLY FROM YOUR NECK DOWN TO YOUR TOES (washing for 3-5 minutes)  DO NOT use CHG soap on face, private areas, open wounds, or sores.  Pay special attention to the area where your surgery is being performed.  If you are having back surgery, having someone wash your back for you may be helpful. Wait 2 minutes after CHG soap is applied, then you may rinse off the CHG soap.  Pat dry with a clean towel  Put on clean pajamas     Additional instructions for the day of surgery: DO NOT APPLY any lotions, deodorants or perfumes.   Do not wear jewelry or makeup Do not wear nail polish, gel polish, artificial nails, or any other type of covering on natural nails (fingers and toes) Do not bring valuables to the hospital. [facility] is not responsible for valuables/personal belongings. Put on clean/comfortable clothes.  Please brush your teeth.  Ask your nurse before applying any prescription medications to the skin.

## 2024-07-20 ENCOUNTER — Encounter (HOSPITAL_COMMUNITY)
Admission: RE | Admit: 2024-07-20 | Discharge: 2024-07-20 | Disposition: A | Discharge status: Home / Self Care | Source: Ambulatory Visit | Attending: General Surgery

## 2024-07-20 ENCOUNTER — Encounter (HOSPITAL_COMMUNITY): Payer: Self-pay

## 2024-07-20 ENCOUNTER — Other Ambulatory Visit: Payer: Self-pay

## 2024-07-20 VITALS — BP 166/54 | HR 83 | Temp 98.2°F | Resp 17 | Ht 64.0 in | Wt 194.1 lb

## 2024-07-20 DIAGNOSIS — E119 Type 2 diabetes mellitus without complications: Secondary | ICD-10-CM | POA: Insufficient documentation

## 2024-07-20 DIAGNOSIS — Z01812 Encounter for preprocedural laboratory examination: Secondary | ICD-10-CM | POA: Insufficient documentation

## 2024-07-20 DIAGNOSIS — K7460 Unspecified cirrhosis of liver: Secondary | ICD-10-CM | POA: Insufficient documentation

## 2024-07-20 DIAGNOSIS — Z79899 Other long term (current) drug therapy: Secondary | ICD-10-CM | POA: Diagnosis not present

## 2024-07-20 DIAGNOSIS — K219 Gastro-esophageal reflux disease without esophagitis: Secondary | ICD-10-CM | POA: Insufficient documentation

## 2024-07-20 DIAGNOSIS — Z87891 Personal history of nicotine dependence: Secondary | ICD-10-CM | POA: Diagnosis not present

## 2024-07-20 DIAGNOSIS — K5900 Constipation, unspecified: Secondary | ICD-10-CM | POA: Diagnosis not present

## 2024-07-20 DIAGNOSIS — Z85828 Personal history of other malignant neoplasm of skin: Secondary | ICD-10-CM | POA: Diagnosis not present

## 2024-07-20 DIAGNOSIS — K8010 Calculus of gallbladder with chronic cholecystitis without obstruction: Secondary | ICD-10-CM | POA: Insufficient documentation

## 2024-07-20 DIAGNOSIS — R54 Age-related physical debility: Secondary | ICD-10-CM | POA: Diagnosis not present

## 2024-07-20 DIAGNOSIS — R001 Bradycardia, unspecified: Secondary | ICD-10-CM | POA: Insufficient documentation

## 2024-07-20 DIAGNOSIS — E785 Hyperlipidemia, unspecified: Secondary | ICD-10-CM | POA: Diagnosis not present

## 2024-07-20 DIAGNOSIS — Z885 Allergy status to narcotic agent status: Secondary | ICD-10-CM | POA: Diagnosis not present

## 2024-07-20 DIAGNOSIS — Z96611 Presence of right artificial shoulder joint: Secondary | ICD-10-CM | POA: Diagnosis not present

## 2024-07-20 DIAGNOSIS — Z882 Allergy status to sulfonamides status: Secondary | ICD-10-CM | POA: Diagnosis not present

## 2024-07-20 DIAGNOSIS — K7469 Other cirrhosis of liver: Secondary | ICD-10-CM | POA: Diagnosis not present

## 2024-07-20 DIAGNOSIS — K660 Peritoneal adhesions (postprocedural) (postinfection): Secondary | ICD-10-CM | POA: Diagnosis not present

## 2024-07-20 DIAGNOSIS — I10 Essential (primary) hypertension: Secondary | ICD-10-CM | POA: Insufficient documentation

## 2024-07-20 DIAGNOSIS — Z7951 Long term (current) use of inhaled steroids: Secondary | ICD-10-CM | POA: Diagnosis not present

## 2024-07-20 DIAGNOSIS — Z880 Allergy status to penicillin: Secondary | ICD-10-CM | POA: Diagnosis not present

## 2024-07-20 DIAGNOSIS — Z794 Long term (current) use of insulin: Secondary | ICD-10-CM | POA: Insufficient documentation

## 2024-07-20 DIAGNOSIS — J4489 Other specified chronic obstructive pulmonary disease: Secondary | ICD-10-CM | POA: Insufficient documentation

## 2024-07-20 DIAGNOSIS — Z888 Allergy status to other drugs, medicaments and biological substances status: Secondary | ICD-10-CM | POA: Diagnosis not present

## 2024-07-20 DIAGNOSIS — M810 Age-related osteoporosis without current pathological fracture: Secondary | ICD-10-CM | POA: Diagnosis not present

## 2024-07-20 DIAGNOSIS — Z7985 Long-term (current) use of injectable non-insulin antidiabetic drugs: Secondary | ICD-10-CM | POA: Diagnosis not present

## 2024-07-20 DIAGNOSIS — E1140 Type 2 diabetes mellitus with diabetic neuropathy, unspecified: Secondary | ICD-10-CM | POA: Diagnosis not present

## 2024-07-20 HISTORY — DX: Unspecified cirrhosis of liver: K74.60

## 2024-07-20 LAB — COMPREHENSIVE METABOLIC PANEL WITH GFR
ALT: 15 U/L (ref 0–44)
AST: 20 U/L (ref 15–41)
Albumin: 3.6 g/dL (ref 3.5–5.0)
Alkaline Phosphatase: 62 U/L (ref 38–126)
Anion gap: 11 (ref 5–15)
BUN: 15 mg/dL (ref 8–23)
CO2: 24 mmol/L (ref 22–32)
Calcium: 9.1 mg/dL (ref 8.9–10.3)
Chloride: 107 mmol/L (ref 98–111)
Creatinine, Ser: 0.93 mg/dL (ref 0.44–1.00)
GFR, Estimated: >60 mL/min (ref >60)
Glucose, Bld: 109 mg/dL — ABNORMAL HIGH (ref 70–99)
Potassium: 4.4 mmol/L (ref 3.5–5.1)
Sodium: 142 mmol/L (ref 135–145)
Total Bilirubin: <0.2 mg/dL (ref 0.0–1.2)
Total Protein: 6.7 g/dL (ref 6.5–8.1)

## 2024-07-20 LAB — CBC WITH DIFFERENTIAL/PLATELET
Abs Immature Granulocytes: 0.01 K/uL (ref 0.00–0.07)
Basophils Absolute: 0 K/uL (ref 0.0–0.1)
Basophils Relative: 1 %
Eosinophils Absolute: 0.1 K/uL (ref 0.0–0.5)
Eosinophils Relative: 1 %
HCT: 43.4 % (ref 36.0–46.0)
Hemoglobin: 14.4 g/dL (ref 12.0–15.0)
Immature Granulocytes: 0 %
Lymphocytes Relative: 38 %
Lymphs Abs: 2.2 K/uL (ref 0.7–4.0)
MCH: 30.6 pg (ref 26.0–34.0)
MCHC: 33.2 g/dL (ref 30.0–36.0)
MCV: 92.3 fL (ref 80.0–100.0)
Monocytes Absolute: 0.4 K/uL (ref 0.1–1.0)
Monocytes Relative: 7 %
Neutro Abs: 3.1 K/uL (ref 1.7–7.7)
Neutrophils Relative %: 53 %
Platelets: 190 K/uL (ref 150–400)
RBC: 4.7 MIL/uL (ref 3.87–5.11)
RDW: 12.8 % (ref 11.5–15.5)
WBC: 5.7 K/uL (ref 4.0–10.5)
nRBC: 0 % (ref 0.0–0.2)

## 2024-07-20 LAB — TYPE AND SCREEN
ABO/RH(D): O POS
Antibody Screen: NEGATIVE

## 2024-07-20 LAB — GLUCOSE, CAPILLARY: Glucose-Capillary: 105 mg/dL — ABNORMAL HIGH (ref 70–99)

## 2024-07-20 LAB — PROTIME-INR
INR: 1 (ref 0.8–1.2)
Prothrombin Time: 13.9 s (ref 11.4–15.2)

## 2024-07-20 LAB — HEMOGLOBIN A1C
Hgb A1c MFr Bld: 5.1 % (ref 4.8–5.6)
Mean Plasma Glucose: 99.67 mg/dL

## 2024-07-20 NOTE — Progress Notes (Signed)
 Surgical Instructions   Your procedure is scheduled on Thursday July 23, 2024. Report to [facility] Main Entrance A at 11:00 A.M., then check in with the Admitting office. Any questions or running late day of surgery: call [phone]  Questions prior to your surgery date: call [phone], Monday-Friday, 8am-4pm. If you experience any cold or flu symptoms such as cough, fever, chills, shortness of breath, etc. between now and your scheduled surgery, please notify us  at the above number.     Remember:  Do not eat after midnight the night before your surgery   You may drink clear liquids until 10:00 the morning of your surgery.   Clear liquids allowed are: Water , Non-Citrus Juices (without pulp), Carbonated Beverages, Clear Tea (no milk, honey, etc.), Black Coffee Only (NO MILK, CREAM OR POWDERED CREAMER of any kind), and Gatorade.    Take these medicines the morning of surgery with A SIP OF WATER   amLODipine  (NORVASC )  FLUoxetine  (PROZAC )  fluticasone  (FLONASE )  loratadine  (CLARITIN )  metoprolol  succinate (TOPROL -XL)  omeprazole  (PRILOSEC)   May take these medicines IF NEEDED: albuterol  (PROAIR  HFA) 108 (90 Base) MCG/ACT inhaler Please bring with you to the hospital hydroxypropyl methylcellulose (ISOPTO TEARS) 2.5 % ophthalmic solution  LORazepam  (ATIVAN )   One week prior to surgery, STOP taking any Aspirin  (unless otherwise instructed by your surgeon) Aleve, Naproxen, Ibuprofen, Motrin, Advil, Goody's, BC's, all herbal medications, fish oil, and non-prescription vitamins.   WHAT DO I DO ABOUT MY DIABETES MEDICATION?   DO NOT TAKE YOUR Semaglutide , 1 MG/DOSE, (OZEMPIC ) 7 DAYS PRIOR TO SURGERY WITH THE LAST DOSE BEING NO LATER THAN 07/15/2024.  THE NIGHT BEFORE SURGERY TAKE 8 UNITS OF YOUR insulin  glargine (LANTUS  SOLOSTAR), WHICH IS 50% OF YOUR REGULAR DOSE.   THE MORNING OF SURGERY TAKE 16 UNITS OF YOUR insulin  glargine (LANTUS  SOLOSTAR), WHICH IS 50% OF YOUR  REGULAR DOSE.   The day of surgery, do not take other diabetes injectables, including Byetta (exenatide), Bydureon (exenatide ER), Victoza (liraglutide), or Trulicity (dulaglutide).  If your CBG is greater than 220 mg/dL, you may take  of your sliding scale (correction) dose of insulin .   HOW TO MANAGE YOUR DIABETES BEFORE AND AFTER SURGERY  Why is it important to control my blood sugar before and after surgery? Improving blood sugar levels before and after surgery helps healing and can limit problems. A way of improving blood sugar control is eating a healthy diet by:  Eating less sugar and carbohydrates  Increasing activity/exercise  Talking with your doctor about reaching your blood sugar goals High blood sugars (greater than 180 mg/dL) can raise your risk of infections and slow your recovery, so you will need to focus on controlling your diabetes during the weeks before surgery. Make sure that the doctor who takes care of your diabetes knows about your planned surgery including the date and location.  How do I manage my blood sugar before surgery? Check your blood sugar at least 4 times a day, starting 2 days before surgery, to make sure that the level is not too high or low.  Check your blood sugar the morning of your surgery when you wake up and every 2 hours until you get to the Short Stay unit.  If your blood sugar is less than 70 mg/dL, you will need to treat for low blood sugar: Do not take insulin . Treat a low blood sugar (less than 70 mg/dL) with  cup of clear juice (cranberry or apple), 4 glucose tablets, OR glucose  gel. Recheck blood sugar in 15 minutes after treatment (to make sure it is greater than 70 mg/dL). If your blood sugar is not greater than 70 mg/dL on recheck, call [phone] for further instructions. Report your blood sugar to the short stay nurse when you get to Short Stay.  If you are admitted to the hospital after surgery: Your blood sugar will be  checked by the staff and you will probably be given insulin  after surgery (instead of oral diabetes medicines) to make sure you have good blood sugar levels. The goal for blood sugar control after surgery is 80-180 mg/dL.                      Do NOT Smoke (Tobacco/Vaping) for 24 hours prior to your procedure.  If you use a CPAP at night, you may bring your mask/headgear for your overnight stay.   You will be asked to remove any contacts, glasses, piercing's, hearing aid's, dentures/partials prior to surgery. Please bring cases for these items if needed.    Patients discharged the day of surgery will not be allowed to drive home, and someone needs to stay with them for 24 hours.  SURGICAL WAITING ROOM VISITATION Patients may have no more than 2 support people in the waiting area - these visitors may rotate.   Pre-op nurse will coordinate an appropriate time for 1 ADULT support person, who may not rotate, to accompany patient in pre-op.  Children under the age of 45 must have an adult with them who is not the patient and must remain in the main waiting area with an adult.  If the patient needs to stay at the hospital during part of their recovery, the visitor guidelines for inpatient rooms apply.  Please refer to the [facility] website for the visitor guidelines for any additional information.   If you received a COVID test during your pre-op visit  it is requested that you wear a mask when out in public, stay away from anyone that may not be feeling well and notify your surgeon if you develop symptoms. If you have been in contact with anyone that has tested positive in the last 10 days please notify you surgeon.      Pre-operative CHG Bathing Instructions   You can play a key role in reducing the risk of infection after surgery. Your skin needs to be as free of germs as possible. You can reduce the number of germs on your skin by washing with CHG (chlorhexidine  gluconate) soap before  surgery. CHG is an antiseptic soap that kills germs and continues to kill germs even after washing.   DO NOT use if you have an allergy to chlorhexidine /CHG or antibacterial soaps. If your skin becomes reddened or irritated, stop using the CHG and notify one of our RNs at [phone].              TAKE A SHOWER THE NIGHT BEFORE SURGERY AND THE DAY OF SURGERY    Please keep in mind the following:  DO NOT shave, including legs and underarms, 48 hours prior to surgery.   Place clean sheets on your bed the night before surgery Use a clean washcloth (not used since being washed) for each shower. DO NOT sleep with pet's night before surgery.  CHG Shower Instructions:  Wash your face and private area with normal soap. If you choose to wash your hair, wash first with your normal shampoo.  After you use shampoo/soap, rinse  your hair and body thoroughly to remove shampoo/soap residue.  Turn the water  OFF and apply half the bottle of CHG soap to a CLEAN washcloth.  Apply CHG soap ONLY FROM YOUR NECK DOWN TO YOUR TOES (washing for 3-5 minutes)  DO NOT use CHG soap on face, private areas, open wounds, or sores.  Pay special attention to the area where your surgery is being performed.  If you are having back surgery, having someone wash your back for you may be helpful. Wait 2 minutes after CHG soap is applied, then you may rinse off the CHG soap.  Pat dry with a clean towel  Put on clean pajamas    Additional instructions for the day of surgery: DO NOT APPLY any lotions, deodorants or perfumes.   Do not wear jewelry or makeup Do not wear nail polish, gel polish, artificial nails, or any other type of covering on natural nails (fingers and toes) Do not bring valuables to the hospital. [facility] is not responsible for valuables/personal belongings. Put on clean/comfortable clothes.  Please brush your teeth.  Ask your nurse before applying any prescription medications to the skin.

## 2024-07-20 NOTE — Progress Notes (Signed)
 PCP - Jolinda Norene HERO, DO  Cardiologist -  Dr Lynwood Schilling- f/u as needed  PPM/ICD - denies   Chest x-ray - N/A EKG - 06/09/24 Stress Test - denies ECHO - 06/10/24 Cardiac Cath -denies   Sleep Study - denies   Fasting Blood Sugar - Pt wears CGM to left arm- Pt reports her blood sugar is normally 105-130 when fasting. Hgb A1c last 05/11/24 6.4- drawn again today.   Last dose of GLP1 agonist- Last dose of Ozempic  07/10/24   Blood Thinner Instructions: N/A Aspirin  Instructions: N/A  ERAS Protcol - ERAS per order   COVID TEST- N/A   Anesthesia review: Pt had heart clearance prior to procedure in August. Pt denies any new issues.   Patient denies shortness of breath, fever, cough and chest pain at PAT appointment   All instructions explained to the patient, with a verbal understanding of the material. Patient agrees to go over the instructions while at home for a better understanding. The opportunity to ask questions was provided.

## 2024-07-20 NOTE — H&P (Signed)
 REFERRING PHYSICIAN:  Cindie Dunnings, DO PROVIDER:  JINA CLAIR NEPHEW, MD MRN: [id number] DOB: July 15, 1946 DATE OF ENCOUNTER: 07/07/2024 Subjective    Plan Chief Complaint: New Consultation (- Discuss removal of gallbladder)   History of Present Illness: Diana Pratt is a 78 y.o. female who is seen today as an office consultation for evaluation of New Consultation (- Discuss removal of gallbladder)     Patient presented to Dr. Evonnie up in [place] earlier this summer with symptoms of epigastric fullness and right upper quadrant discomfort.  She also complained of nausea.  She had previously had an EGD to evaluate her stomach and had tried GERD treatment.  She had imaging showing gallstones.  She was taken to the OR for robotic cholecystectomy and was seen to have nodular cirrhosis.  The operation was aborted.  She was referred here for operative management.   The patient does report having almost daily discomfort and does not have any severe pain episodes.  She does not have any diarrhea but does have some constipation.  She denies any colicky pain or jaundice.  She has nausea every morning and has difficulty eating at least 1 meal a day.   Father has history of colon cancer       MR liver 05/18/24 IMPRESSION: 1. Cholelithiasis without evidence of acute cholecystitis. 2. No biliary dilatation. Common bile duct is normal in caliber on today's examination measuring up to 0.6 cm.   Superficial liver biopsy 06/11/24 A. LIVER, BIOPSY:       Subcapsular sampled hepatic parenchyma with bridging fibrosis and  cirrhosis.    Review of Systems: A complete review of systems was obtained from the patient.  I have reviewed this information and discussed as appropriate with the patient.  See HPI as well for other ROS.   Review of Systems  HENT:  Positive for congestion.   Gastrointestinal:  Positive for abdominal pain, constipation, heartburn and nausea.  Psychiatric/Behavioral:   Positive for depression. The patient is nervous/anxious.   All other systems reviewed and are negative.     Medical History: Past Medical History      Past Medical History:  Diagnosis Date   Anxiety     Asthma, unspecified asthma severity, unspecified whether complicated, unspecified whether persistent (HHS-HCC)     COPD (chronic obstructive pulmonary disease) (CMS/HHS-HCC)     Depression     Diabetes mellitus without complication (CMS/HHS-HCC)     Gallstones 04/27/2024   GERD (gastroesophageal reflux disease)     History of cancer     Hyperlipidemia     Hypertension     Osteoporosis     PONV (postoperative nausea and vomiting)          Problem List     Patient Active Problem List  Diagnosis   CCC (chronic calculous cholecystitis)   Diffuse nodular cirrhosis of liver (CMS/HHS-HCC)        Past Surgical History       Past Surgical History:  Procedure Laterality Date   ABDOMINAL HYSTERECTOMY   03/1984   COLONOSCOPY   07/19/2014   Reverse shoulder arthroplasty  Right 06/14/2016   Skin cancer removed   08/07/2016    Left ear        Allergies       Allergies  Allergen Reactions   Penicillins Hives, Itching and Other (See Comments)   Sulfa (Sulfonamide Antibiotics) Hives, Itching and Other (See Comments)   Ezetimibe Cough and Other (See Comments)   Morphine  Other (See  Comments) and Itching      Burning, itching  Burning, itching   Burning, itching   Statins-Hmg-Coa Reductase Inhibitors Other (See Comments)      Myalgias  Myalgias   Myalgias   Codeine Other (See Comments)      Burning, itching        Medications Ordered Prior to Encounter        Current Outpatient Medications on File Prior to Visit  Medication Sig Dispense Refill   albuterol  MDI, PROVENTIL , VENTOLIN , PROAIR , HFA 90 mcg/actuation inhaler Inhale 2 inhalations into the lungs       amLODIPine  (NORVASC ) 10 MG tablet Take 10 mg by mouth once daily       artificial tears (GONIOSCOP-HPM) 2.5 %  ophthalmic solution Apply 1 drop to eye       budesonide -formoteroL  (SYMBICORT ) 160-4.5 mcg/actuation inhaler Inhale 2 inhalations into the lungs 2 (two) times daily       calcium  carbonate (TUMS) 200 mg calcium  (500 mg) chewable tablet Take 3 tablets by mouth       conjugated estrogens  (PREMARIN ) 0.625 mg/gram vaginal cream APPLY ONE-HALF GRAM (ONE-FOURTH APPLICATORFUL) VAGINALLY THREE TIMES A  WEEK       diclofenac (VOLTAREN) 25 MG EC tablet Take 25 mg by mouth 3 (three) times daily       docusate (COLACE) 100 MG capsule Take 100 mg by mouth 2 (two) times daily       ergocalciferol , vitamin D2, 1,250 mcg (50,000 unit) capsule Take 50,000 Units by mouth every 7 (seven) days       FLUoxetine  (PROZAC ) 10 MG capsule Take 30 mg by mouth once daily       fluticasone  propionate (FLONASE ) 50 mcg/actuation nasal spray Place 2 sprays into both nostrils once daily       hydroCHLOROthiazide  (HYDRODIURIL ) 25 MG tablet Take 25 mg by mouth once daily       insulin  GLARGINE (LANTUS  SOLOSTAR U-100 INSULIN ) pen injector (concentration 100 units/mL) Inject 32 units in the morning and 23 units in the evening.       loratadine  (CLARITIN ) 10 mg tablet Take 10 mg by mouth       LORazepam  (ATIVAN ) 1 MG tablet Take 1/2-1 tablet daily if needed for anxiety.  May repeat dose once if needed during the day. PUT ON FILE       metoprolol  SUCCinate (TOPROL -XL) 100 MG XL tablet Take 100 mg by mouth       omeprazole  (PRILOSEC) 20 MG DR capsule Take 20 mg by mouth once daily       oxyBUTYnin  (DITROPAN -XL) 5 MG XL tablet Take 5 mg by mouth       oxyCODONE  (ROXICODONE ) 5 MG immediate release tablet Take 5 mg by mouth every 6 (six) hours as needed       REPATHA  SURECLICK 140 mg/mL PnIj Inject 140 mg subcutaneously every 14 (fourteen) days       semaglutide  (OZEMPIC ) 1 mg/dose (4 mg/3 mL) pen injector INJECT 1 MG SUBCUTANEOUSLY ONCE A WEEK - (ADMINISTER BY SUBCUTANEOUS INJECTION INTO THE ABDOMEN, THIGH, OR UPPER ARM AT ANY TIME OF DAY  ON THE SAME DAY EACH WEEK) DISCARD PEN 56 DAYS AFTER FIRST USE       valsartan  (DIOVAN ) 320 MG tablet Take 320 mg by mouth once daily        No current facility-administered medications on file prior to visit.        Family History  History reviewed. No pertinent family history.  Tobacco Use History  Social History        Tobacco Use  Smoking Status Former   Types: Cigarettes  Smokeless Tobacco Never        Social History  Social History         Socioeconomic History   Marital status: Married  Tobacco Use   Smoking status: Former      Types: Cigarettes   Smokeless tobacco: Never  Vaping Use   Vaping status: Never Used  Substance and Sexual Activity   Alcohol use: Never   Drug use: Never   Sexual activity: Defer    Social Drivers of Acupuncturist Strain: Low Risk  (08/15/2023)    Received from [facility]    Overall Financial Resource Strain (CARDIA)     Difficulty of Paying Living Expenses: Not hard at all  Food Insecurity: No Food Insecurity (08/15/2023)    Received from [facility]    Hunger Vital Sign     Within the past 12 months, you worried that your food would run out before you got the money to buy more.: Never true     Within the past 12 months, the food you bought just didn't last and you didn't have money to get more.: Never true  Transportation Needs: No Transportation Needs (08/15/2023)    Received from [facility] - Transportation     Lack of Transportation (Medical): No     Lack of Transportation (Non-Medical): No  Physical Activity: Inactive (08/15/2023)    Received from [facility]    Exercise Vital Sign     On average, how many days per week do you engage in moderate to strenuous exercise (like a brisk walk)?: 0 days     On average, how many minutes do you engage in exercise at this level?: 0 min  Stress: No Stress Concern Present (08/15/2023)    Received from [facility]  [facility]up[facility] - Occupational Stress Questionnaire     Feeling of Stress : Not at all  Social Connections: Moderately Isolated (08/15/2023)    Received from [facility]    Social Connection and Isolation Panel     In a typical week, how many times do you talk on the phone with family, friends, or neighbors?: More than three times a week     How often do you get together with friends or relatives?: More than three times a week     How often do you attend church or religious services?: Never     Do you belong to any clubs or organizations such as church groups, unions, fraternal or athletic groups, or school groups?: No     How often do you attend meetings of the clubs or organizations you belong to?: Never     Are you married, widowed, divorced, separated, never married, or living with a partner?: Married  Housing Stability: Unknown (07/07/2024)    Housing Stability Vital Sign     Homeless in the Last Year: No        Objective:       Vitals:    07/07/24 0853  BP: 136/65  Pulse: 84  Temp: 36.3 C (97.4 F)  TempSrc: Temporal  SpO2: 99%  Weight: 89 kg (196 lb 3.2 oz)  Height: 162.6 cm (5' 4)  PainSc:   9  PainLoc: Abdomen    Body mass index is 33.68 kg/m.  Head:   Normocephalic and atraumatic.  Mouth/Throat: Oropharynx is clear and moist. No oropharyngeal exudate.  Eyes:   Conjunctivae are normal. Pupils are equal, round, and reactive to light. No scleral icterus.  Neck:   Normal range of motion. Neck supple. No tracheal deviation present. No thyromegaly present.  Cardiovascular: Normal rate, regular rhythm, normal heart sounds and intact distal pulses.  Exam reveals no gallop and no friction rub.   No murmur heard. Respiratory: Effort normal and breath sounds normal. No respiratory distress. No wheezes, rales or rhonchi.  No chest wall tenderness.  GI:       Soft. Bowel sounds are normal. Abdomen is soft, mildly tender epigastrium, RUQ, non distended.  No masses or  hepatosplenomegaly is present. There is no rebound and no guarding.  Musculoskeletal: . Extremities are non tender and without deformity.  Lymphadenopathy:   No cervical or axillary adenopathy.  Neurological: Alert and oriented to person, place, and time. Coordination normal.  Skin:    Skin is warm and dry. No rash noted. No diaphoresis. No erythema. No pallor.  Psychiatric: Normal mood and affect.Behavior is normal. Judgment and thought content normal.      Labs, Imaging and Diagnostic Testing: 06/17/2024 INR, cmet normal 04/30/2024 Normal with plts 233   Assessment and Plan:    Assessment Diagnoses and all orders for this visit:   Diffuse nodular cirrhosis of liver (CMS/HHS-HCC) -     US  elastography parenchyma; Future   CCC (chronic calculous cholecystitis)     Patient appears to have chronic calculus cholecystitis and Karolynn a cirrhosis.   I reviewed the MRI and discussed this with radiology.  There does not appear to be any evidence of portal hypertension that was missed on the original read.  The patient might have slight enlargement of the left lateral segment, but does not appear to have any other signs that were missed.  There is certainly no varices seen and no recanalization of the umbilical vein.  The patient has no ascites.  All of her labs are normal.   I do think the patient has chronic cholecystitis and although does not have any signs of stones passing through the common duct, does have daily pain and nausea that warrant cholecystectomy.  I think that she would progress to acute cholecystitis if she does not have surgery.   I will schedule this with another surgeon to maximize chances of minimally invasive surgery.  I did discuss however that cirrhotic liver has to have a much higher chance of complications especially bleeding.  I reviewed that we would try her best to do this unwillingly invasive fashion including potentially having extra port sites.  I would not do  this robotically.   I reviewed the risks of bleeding, infection, damage to adjacent structures which is higher with cirrhotic livers.  I reviewed risk of heart and lung complications.  I discussed the risk of blood clot, conversion to open operation which is much higher and cirrhosis.  Discussed risk of death which is also higher in cirrhosis.  The patient is not on any blood thinners.     Patient has no history of coronary artery disease.  She has already seen cardiology last month and has no ischemic concerns.   I am ordering an ultrasound of the liver with elastography to assess the level of fibrosis.  I am also planning a Tru-Cut liver biopsy to assess the deeper portions of the liver.     Highly complex medical decision making.  High risk patient.

## 2024-07-21 NOTE — Progress Notes (Signed)
 Anesthesia Chart Review:  Case: [id number] Date/Time: 07/23/24 1315   Procedures:      LAPAROSCOPIC CHOLECYSTECTOMY - POSSIBLE OPEN     BIOPSY, LIVER     CYSTOSCOPY WITH INDOCYANINE GREEN  IMAGING (ICG)   Anesthesia type: General   Diagnosis:      Hepatic cirrhosis, unspecified hepatic cirrhosis type, unspecified whether ascites present (HCC) [K74.60]     CCC (chronic calculous cholecystitis) [K80.10]   Pre-op diagnosis:      CIRRHOSIS     CHRONIC CALCULOUS CHOLECYSTITIS   [facility] RO[facility] / [facility]   Surgeons: Aron Shoulders, MD; Nieves Cough, MD       DISCUSSION: Patient is a 78 year old female scheduled for the above procedure. She was scheduled to undergo robotic assisted laparoscopic cholecystectomy for cholelithiasis by Evonnie Craven, DO at [facility] on 06/11/2024; However, intraoperative findings showed the liver to be significantly nodular and concerning for liver cirrhosis. The gallbladder was distended but without significant inflammatory changes. Ultimately, decision make to abort the procedure. She was referred to GI Dr. Cindie, and MRI liver done. Per evaluation with Dr. Aron on 07/07/2024, I reviewed the MRI and discussed this with radiology.  There does not appear to be any evidence of portal hypertension that was missed on the original read.  The patient might have slight enlargement of the left lateral segment, but does not appear to have any other signs that were missed.  There is certainly no varices seen and no recanalization of the umbilical vein.  The patient has no ascites.  All of her labs are normal.... I think that she would progress to acute cholecystitis if she does not have surgery.   I will schedule this with another surgeon to maximize chances of minimally invasive surgery.  I did discuss however that cirrhotic liver has to have a much higher chance of complications especially bleeding.  I reviewed that we would try her best to do this  unwillingly invasive fashion including potentially having extra port sites.  I would not do this robotically.... Notes also indicated she is planning a Tru-Cut liver biopsy to assess the deeper portions of the liver.   Other history includes former smoker (quit 06/01/1994), post-operative N/v, HTN, DM2 (with neuropathy), HLD, asthma, COPD, dyspnea, GERD, skin cancer (s/p excision), liver cirrhosis, interstitial cystitis, anxiety, hysterectomy  She had an urgent preoperative cardiology evaluation with Lavona Agent, MD on 06/10/2024 after 06/09/2024 PAT EKG was read as junctional rhythm. Dr. Lavona later reviewed and felt it represented SB. She did have a preoperative TTE on 06/10/2024 that showed LVEF 65 to 70%, no regional wall motion abnormalities, normal RV systolic function, normal PASP, mild to moderately dilated LA, mildly dilated RA, mild MR. He wrote, Preop: The patient has no high risk findings.  She does have sinus bradycardia but there were no significant arrhythmias on the EKG.  There are subtle P waves per my review.  She has a high functional level.  She is not going for high risk procedure.  Therefore, based on ACC/AHA guidelines no further testing is suggested. Risk factor modification per primary care with as needed cardiology follow-up planned.   A1c 5.1%. She is on Lantus  100 unit/mL 32 units Q AM and 17 units Q PM and Semaglutide  (last dose 07/10/2024). She has a Dexcom G7 CGM.  Anesthesia team to evaluate on the day of surgery.   VS: BP (!) 166/54   Pulse 83   Temp 36.8 C   Resp 17  Ht 5' 4 (1.626 m)   Wt 88 kg   SpO2 97%   BMI 33.32 kg/m   PROVIDERS: Jolinda Norene HERO, DO is PCP Cindie Dunnings, DO is GI Lavona Agent, MD is cardiologist. As needed follow-up per 06/10/2024 preoperative visit.    LABS: Labs reviewed: Acceptable for surgery. (all labs ordered are listed, but only abnormal results are displayed)  Labs Reviewed  GLUCOSE, CAPILLARY - Abnormal;  Notable for the following components:      Result Value   Glucose-Capillary 105 (*)    All other components within normal limits  COMPREHENSIVE METABOLIC PANEL WITH GFR - Abnormal; Notable for the following components:   Glucose, Bld 109 (*)    All other components within normal limits  HEMOGLOBIN A1C  CBC WITH DIFFERENTIAL/PLATELET  PROTIME-INR  TYPE AND SCREEN    IMAGES: US  Elastography Liver 07/09/2024: IMPRESSION: ULTRASOUND LIVER: Increased hepatic echogenicity, favoring steatosis.   ULTRASOUND HEPATIC ELASTOGRAPHY: - Median kPa:  47.4 - Diagnostic category: > or =17 kPa: highly suggestive of cACLD with an increased probability of clinically significant portal hypertension - Reduced accuracy exam, likely due to patient body habitus. - In the setting of elevated liver function tests, non-fasting state, or vascular congestion, the stage of liver fibrosis may be overestimated. In some patients with NAFLD, the cut-off values for cACLD may be lower (7-9 kPa). In causes other than viral hepatitis and NAFLD, the cut-off values are not well established.  MRI Abd MRCP 05/18/2024: IMPRESSION: 1. Cholelithiasis without evidence of acute cholecystitis. 2. No biliary dilatation. Common bile duct is normal in caliber on today's examination measuring up to 0.6 cm.   EKG: 06/09/2024: Sinus bradycardia at 57 bpm  Rightward axis Low voltage QRS Nonspecific ST abnormality Abnormal ECG When compared with ECG of 03-Sep-2019 22:02, Rate slower PREVIOUS ECG IS PRESENT Confirmed by Sheryle Carwin [id number]) on 06/09/2024 11:26:00 PM   CV: Echo 06/10/2024: IMPRESSIONS   1. Left ventricular ejection fraction, by estimation, is 65 to 70%. The  left ventricle has normal function. The left ventricle has no regional  wall motion abnormalities. Left ventricular diastolic parameters are  indeterminate. The average left  ventricular global longitudinal strain is -20.8 %. The global longitudinal  strain is  normal.   2. Right ventricular systolic function is normal. The right ventricular  size is normal. There is normal pulmonary artery systolic pressure.   3. Left atrial size was mild to moderately dilated.   4. Right atrial size was mildly dilated.   5. The mitral valve is normal in structure. Mild mitral valve  regurgitation. No evidence of mitral stenosis.   6. The aortic valve is tricuspid. Aortic valve regurgitation is not  visualized. No aortic stenosis is present.   7. The inferior vena cava is normal in size with greater than 50%  respiratory variability, suggesting right atrial pressure of 3 mmHg.  - Comparison(s): No prior Echocardiogram.  - Conclusion(s)/Recommendation(s): Normal biventricular function without  evidence of hemodynamically significant valvular heart disease.   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 jayson acton   Depression    Diabetes mellitus    Diabetic neuropathy (HCC)    Gallstones 04/27/2024   GERD (gastroesophageal reflux disease)  occ heartburn   Humerus fracture    right   Hyperlipemia    Hypertension    Interstitial cystitis    Osteoporosis    Pneumonia    15 yrs ago   PONV (postoperative nausea and vomiting)    Shortness of breath dyspnea     Past Surgical History:  Procedure Laterality Date   ABDOMINAL HYSTERECTOMY  03/1984   partial   COLONOSCOPY N/A 07/19/2014   Procedure: COLONOSCOPY;  Surgeon: Margo LITTIE Haddock, MD;  Location: [facility];  Service: Endoscopy;  Laterality: N/A;  9:30   OTHER SURGICAL HISTORY     06/2024- attempted cholecystectomy per pt- aborted procedure- did liver biopsy   REVERSE SHOULDER ARTHROPLASTY Right 06/14/2016   Procedure: REVERSE SHOULDER ARTHROPLASTY;  Surgeon: Franky Pointer, MD;  Location: [facility] OR;  Service:  Orthopedics;  Laterality: Right;   Skin cancer removed  08/07/2016   left ear    MEDICATIONS:  albuterol  (PROAIR  HFA) 108 (90 Base) MCG/ACT inhaler   amLODipine  (NORVASC ) 10 MG tablet   calcium  carbonate (TUMS - DOSED IN MG ELEMENTAL CALCIUM ) 500 MG chewable tablet   conjugated estrogens  (PREMARIN ) vaginal cream   Continuous Glucose Receiver (DEXCOM G7 RECEIVER) DEVI   Continuous Glucose Sensor (DEXCOM G7 SENSOR) MISC   docusate sodium  (COLACE) 100 MG capsule   Evolocumab  (REPATHA  SURECLICK) 140 MG/ML SOAJ   FLUoxetine  (PROZAC ) 10 MG capsule   fluticasone  (FLONASE ) 50 MCG/ACT nasal spray   hydrochlorothiazide  (HYDRODIURIL ) 25 MG tablet   hydroxypropyl methylcellulose (ISOPTO TEARS) 2.5 % ophthalmic solution   insulin  glargine (LANTUS  SOLOSTAR) 100 UNIT/ML Solostar Pen   loratadine  (CLARITIN ) 10 MG tablet   LORazepam  (ATIVAN ) 1 MG tablet   metoprolol  succinate (TOPROL -XL) 100 MG 24 hr tablet   Multiple Vitamins-Minerals (CENTRUM SILVER PO)   omeprazole  (PRILOSEC) 20 MG capsule   oxybutynin  (DITROPAN  XL) 5 MG 24 hr tablet   oxyCODONE  (ROXICODONE ) 5 MG immediate release tablet   Semaglutide , 1 MG/DOSE, (OZEMPIC , 1 MG/DOSE,) 4 MG/3ML SOPN   valsartan  (DIOVAN ) 320 MG tablet   Vitamin D , Ergocalciferol , (DRISDOL ) 1.25 MG (50000 UNIT) CAPS capsule   No current facility-administered medications for this encounter.   Isaiah Ruder, PA-C Surgical Short Stay/Anesthesiology [facility] Phone [phone] [facility] Phone [phone] 07/21/2024 2:57 PM

## 2024-07-21 NOTE — Anesthesia Preprocedure Evaluation (Signed)
 Anesthesia Evaluation  Patient identified by MRN, date of birth, ID band Patient awake    Reviewed: Allergy & Precautions, NPO status , Patient's Chart, lab work & pertinent test results  History of Anesthesia Complications (+) PONV and history of anesthetic complications  Airway Mallampati: III  TM Distance: >3 FB Neck ROM: Full    Dental no notable dental hx.    Pulmonary asthma , COPD,  COPD inhaler, former smoker   Pulmonary exam normal        Cardiovascular hypertension, Pt. on medications and Pt. on home beta blockers Normal cardiovascular exam   '25 TTE - EF 65 to 70%. Left atrial size was mild to moderately dilated. Right atrial size was mildly dilated. Mild mitral valve regurgitation.     Neuro/Psych  PSYCHIATRIC DISORDERS Anxiety Depression     Neuromuscular disease    GI/Hepatic ,GERD  Medicated and Controlled,,(+) Cirrhosis         Endo/Other  diabetes, Insulin  Dependent  Obesity On GLP-1a   Renal/GU negative Renal ROS    Interstitial cystitis     Musculoskeletal negative musculoskeletal ROS (+)    Abdominal  (+) + obese  Peds  Hematology negative hematology ROS (+)   Anesthesia Other Findings   Reproductive/Obstetrics                              Anesthesia Physical Anesthesia Plan  ASA: 3  Anesthesia Plan: General   Post-op Pain Management: Tylenol  PO (pre-op)*   Induction: Intravenous  PONV Risk Score and Plan: 4 or greater and Treatment may vary due to age or medical condition, Ondansetron , Dexamethasone , Propofol  infusion and Amisulpride   Airway Management Planned: Oral ETT  Additional Equipment: None  Intra-op Plan:   Post-operative Plan: Extubation in OR  Informed Consent: I have reviewed the patients History and Physical, chart, labs and discussed the procedure including the risks, benefits and alternatives for the proposed anesthesia with the  patient or authorized representative who has indicated his/her understanding and acceptance.     Dental advisory given  Plan Discussed with: CRNA and Anesthesiologist  Anesthesia Plan Comments: (PAT note written 07/21/2024 by Kesi Perrow, PA-C. )         Anesthesia Quick Evaluation

## 2024-07-22 NOTE — Progress Notes (Signed)
 Spoke with the pt. She will arrive tom at 1130. NPO post mn except clear liquids up to 1030.

## 2024-07-23 ENCOUNTER — Encounter (HOSPITAL_COMMUNITY): Payer: Self-pay | Admitting: General Surgery

## 2024-07-23 ENCOUNTER — Encounter (HOSPITAL_COMMUNITY): Admitting: Vascular Surgery

## 2024-07-23 ENCOUNTER — Ambulatory Visit (HOSPITAL_COMMUNITY): Admitting: Certified Registered Nurse Anesthetist

## 2024-07-23 ENCOUNTER — Encounter (HOSPITAL_COMMUNITY)
Admission: RE | Disposition: A | Discharge status: Home / Self Care | Payer: Self-pay | Source: Home / Self Care | Attending: General Surgery

## 2024-07-23 ENCOUNTER — Inpatient Hospital Stay (HOSPITAL_COMMUNITY)
Admission: RE | Admit: 2024-07-23 | Discharge: 2024-07-25 | DRG: 419 | Disposition: A | Discharge status: Home / Self Care | Attending: General Surgery | Admitting: General Surgery

## 2024-07-23 ENCOUNTER — Other Ambulatory Visit: Payer: Self-pay

## 2024-07-23 DIAGNOSIS — M810 Age-related osteoporosis without current pathological fracture: Secondary | ICD-10-CM | POA: Diagnosis present

## 2024-07-23 DIAGNOSIS — Z882 Allergy status to sulfonamides status: Secondary | ICD-10-CM | POA: Diagnosis not present

## 2024-07-23 DIAGNOSIS — E119 Type 2 diabetes mellitus without complications: Secondary | ICD-10-CM

## 2024-07-23 DIAGNOSIS — K5900 Constipation, unspecified: Secondary | ICD-10-CM | POA: Diagnosis present

## 2024-07-23 DIAGNOSIS — Z888 Allergy status to other drugs, medicaments and biological substances status: Secondary | ICD-10-CM

## 2024-07-23 DIAGNOSIS — Z85828 Personal history of other malignant neoplasm of skin: Secondary | ICD-10-CM

## 2024-07-23 DIAGNOSIS — Z7985 Long-term (current) use of injectable non-insulin antidiabetic drugs: Secondary | ICD-10-CM

## 2024-07-23 DIAGNOSIS — Z9049 Acquired absence of other specified parts of digestive tract: Secondary | ICD-10-CM

## 2024-07-23 DIAGNOSIS — Z90710 Acquired absence of both cervix and uterus: Secondary | ICD-10-CM | POA: Diagnosis not present

## 2024-07-23 DIAGNOSIS — Z87891 Personal history of nicotine dependence: Secondary | ICD-10-CM | POA: Diagnosis not present

## 2024-07-23 DIAGNOSIS — J4489 Other specified chronic obstructive pulmonary disease: Secondary | ICD-10-CM | POA: Diagnosis present

## 2024-07-23 DIAGNOSIS — E785 Hyperlipidemia, unspecified: Secondary | ICD-10-CM | POA: Diagnosis present

## 2024-07-23 DIAGNOSIS — Z880 Allergy status to penicillin: Secondary | ICD-10-CM

## 2024-07-23 DIAGNOSIS — Z794 Long term (current) use of insulin: Secondary | ICD-10-CM | POA: Diagnosis not present

## 2024-07-23 DIAGNOSIS — Z79899 Other long term (current) drug therapy: Secondary | ICD-10-CM

## 2024-07-23 DIAGNOSIS — E1140 Type 2 diabetes mellitus with diabetic neuropathy, unspecified: Secondary | ICD-10-CM | POA: Diagnosis present

## 2024-07-23 DIAGNOSIS — K760 Fatty (change of) liver, not elsewhere classified: Secondary | ICD-10-CM | POA: Diagnosis not present

## 2024-07-23 DIAGNOSIS — R54 Age-related physical debility: Secondary | ICD-10-CM | POA: Diagnosis present

## 2024-07-23 DIAGNOSIS — I10 Essential (primary) hypertension: Secondary | ICD-10-CM | POA: Diagnosis present

## 2024-07-23 DIAGNOSIS — K660 Peritoneal adhesions (postprocedural) (postinfection): Secondary | ICD-10-CM | POA: Diagnosis present

## 2024-07-23 DIAGNOSIS — K7469 Other cirrhosis of liver: Secondary | ICD-10-CM | POA: Diagnosis present

## 2024-07-23 DIAGNOSIS — Z885 Allergy status to narcotic agent status: Secondary | ICD-10-CM | POA: Diagnosis not present

## 2024-07-23 DIAGNOSIS — F418 Other specified anxiety disorders: Secondary | ICD-10-CM

## 2024-07-23 DIAGNOSIS — Z7951 Long term (current) use of inhaled steroids: Secondary | ICD-10-CM | POA: Diagnosis not present

## 2024-07-23 DIAGNOSIS — Z96611 Presence of right artificial shoulder joint: Secondary | ICD-10-CM | POA: Diagnosis present

## 2024-07-23 DIAGNOSIS — K7460 Unspecified cirrhosis of liver: Secondary | ICD-10-CM | POA: Diagnosis present

## 2024-07-23 DIAGNOSIS — K8010 Calculus of gallbladder with chronic cholecystitis without obstruction: Principal | ICD-10-CM | POA: Diagnosis present

## 2024-07-23 DIAGNOSIS — Z6833 Body mass index (BMI) 33.0-33.9, adult: Secondary | ICD-10-CM | POA: Diagnosis not present

## 2024-07-23 DIAGNOSIS — E669 Obesity, unspecified: Secondary | ICD-10-CM | POA: Diagnosis present

## 2024-07-23 LAB — CREATININE, SERUM
Creatinine, Ser: 0.74 mg/dL (ref 0.44–1.00)
GFR, Estimated: >60 mL/min (ref >60)

## 2024-07-23 LAB — CBC
HCT: 32.3 % — ABNORMAL LOW (ref 36.0–46.0)
Hemoglobin: 10.7 g/dL — ABNORMAL LOW (ref 12.0–15.0)
MCH: 30.9 pg (ref 26.0–34.0)
MCHC: 33.1 g/dL (ref 30.0–36.0)
MCV: 93.4 fL (ref 80.0–100.0)
Platelets: 163 K/uL (ref 150–400)
RBC: 3.46 MIL/uL — ABNORMAL LOW (ref 3.87–5.11)
RDW: 13.1 % (ref 11.5–15.5)
WBC: 7.6 K/uL (ref 4.0–10.5)
nRBC: 0 % (ref 0.0–0.2)

## 2024-07-23 LAB — GLUCOSE, CAPILLARY
Glucose-Capillary: 134 mg/dL — ABNORMAL HIGH (ref 70–99)
Glucose-Capillary: 78 mg/dL (ref 70–99)
Glucose-Capillary: 66 mg/dL — ABNORMAL LOW (ref 70–99)
Glucose-Capillary: 136 mg/dL — ABNORMAL HIGH (ref 70–99)
Glucose-Capillary: 124 mg/dL — ABNORMAL HIGH (ref 70–99)
Glucose-Capillary: 180 mg/dL — ABNORMAL HIGH (ref 70–99)

## 2024-07-23 SURGERY — LAPAROSCOPIC CHOLECYSTECTOMY
Anesthesia: General

## 2024-07-23 MED ORDER — DEXAMETHASONE SODIUM PHOSPHATE 10 MG/ML IJ SOLN
INTRAMUSCULAR | Status: DC | PRN
  Administered 2024-07-23: 5 mg via INTRAVENOUS

## 2024-07-23 MED ORDER — BUPIVACAINE-EPINEPHRINE (PF) 0.25% -1:200000 IJ SOLN
INTRAMUSCULAR | Status: AC
  Filled 2024-07-23: qty 30

## 2024-07-23 MED ORDER — OXYCODONE HCL 5 MG PO TABS
ORAL_TABLET | ORAL | Status: AC
  Filled 2024-07-23: qty 1

## 2024-07-23 MED ORDER — FLUTICASONE PROPIONATE 50 MCG/ACT NA SUSP
2.0000 | Freq: Every day | NASAL | Status: DC
  Administered 2024-07-24 – 2024-07-25 (×2): 2 via NASAL
  Filled 2024-07-23: qty 16

## 2024-07-23 MED ORDER — HYDRALAZINE HCL 20 MG/ML IJ SOLN: 10.0000 mg | INTRAMUSCULAR | Status: DC | PRN

## 2024-07-23 MED ORDER — CEFAZOLIN SODIUM-DEXTROSE 2-4 GM/100ML-% IV SOLN
2.0000 g | Freq: Three times a day (TID) | INTRAVENOUS | Status: AC
  Administered 2024-07-23: 2 g via INTRAVENOUS
  Filled 2024-07-23: qty 100

## 2024-07-23 MED ORDER — AMLODIPINE BESYLATE 10 MG PO TABS
10.0000 mg | ORAL_TABLET | Freq: Every day | ORAL | Status: DC
  Filled 2024-07-23: qty 1

## 2024-07-23 MED ORDER — PROPOFOL 10 MG/ML IV BOLUS
INTRAVENOUS | Status: DC | PRN
  Administered 2024-07-23: 100 mg via INTRAVENOUS

## 2024-07-23 MED ORDER — HEMOSTATIC AGENTS (NO CHARGE) OPTIME
TOPICAL | Status: DC | PRN
  Administered 2024-07-23: 1 via TOPICAL

## 2024-07-23 MED ORDER — CHLORHEXIDINE GLUCONATE CLOTH 2 % EX PADS: 6.0000 | MEDICATED_PAD | Freq: Once | CUTANEOUS | Status: DC

## 2024-07-23 MED ORDER — SUCCINYLCHOLINE CHLORIDE 200 MG/10ML IV SOSY
PREFILLED_SYRINGE | INTRAVENOUS | Status: AC
  Filled 2024-07-23: qty 20

## 2024-07-23 MED ORDER — PHENYLEPHRINE 80 MCG/ML (10ML) SYRINGE FOR IV PUSH (FOR BLOOD PRESSURE SUPPORT)
PREFILLED_SYRINGE | INTRAVENOUS | Status: DC | PRN
  Administered 2024-07-23: 160 ug via INTRAVENOUS

## 2024-07-23 MED ORDER — 0.9 % SODIUM CHLORIDE (POUR BTL) OPTIME
TOPICAL | Status: DC | PRN
  Administered 2024-07-23: 1000 mL

## 2024-07-23 MED ORDER — HYDROCHLOROTHIAZIDE 25 MG PO TABS
25.0000 mg | ORAL_TABLET | Freq: Every day | ORAL | Status: DC
  Filled 2024-07-23: qty 1

## 2024-07-23 MED ORDER — LORATADINE 10 MG PO TABS
10.0000 mg | ORAL_TABLET | Freq: Every day | ORAL | Status: DC
  Administered 2024-07-24 – 2024-07-25 (×2): 10 mg via ORAL
  Filled 2024-07-23 (×2): qty 1

## 2024-07-23 MED ORDER — CALCIUM CARBONATE ANTACID 500 MG PO CHEW
3.0000 | CHEWABLE_TABLET | Freq: Every day | ORAL | Status: DC
  Administered 2024-07-24 – 2024-07-25 (×2): 600 mg via ORAL
  Filled 2024-07-23 (×2): qty 3

## 2024-07-23 MED ORDER — DEXTROSE 50 % IV SOLN
12.5000 g | INTRAVENOUS | Status: AC
  Filled 2024-07-23: qty 50

## 2024-07-23 MED ORDER — CIPROFLOXACIN IN D5W 400 MG/200ML IV SOLN
400.0000 mg | INTRAVENOUS | Status: AC
  Administered 2024-07-23: 400 mg via INTRAVENOUS
  Filled 2024-07-23: qty 200

## 2024-07-23 MED ORDER — OXYCODONE HCL 5 MG PO TABS
5.0000 mg | ORAL_TABLET | Freq: Once | ORAL | Status: AC | PRN
  Administered 2024-07-23: 5 mg via ORAL

## 2024-07-23 MED ORDER — SODIUM CHLORIDE 0.9 % IV SOLN: 250.0000 mL | INTRAVENOUS | Status: AC

## 2024-07-23 MED ORDER — LACTATED RINGERS IV SOLN: INTRAVENOUS | Status: DC

## 2024-07-23 MED ORDER — DIPHENHYDRAMINE HCL 12.5 MG/5ML PO ELIX: 12.5000 mg | ORAL_SOLUTION | Freq: Four times a day (QID) | ORAL | Status: DC | PRN

## 2024-07-23 MED ORDER — LACTATED RINGERS IV SOLN
INTRAVENOUS | Status: AC
Start: 2024-07-23 — End: 2024-07-24

## 2024-07-23 MED ORDER — ACETAMINOPHEN 10 MG/ML IV SOLN
1000.0000 mg | Freq: Once | INTRAVENOUS | Status: AC
  Administered 2024-07-23: 1000 mg via INTRAVENOUS

## 2024-07-23 MED ORDER — LORAZEPAM 0.5 MG PO TABS
0.5000 mg | ORAL_TABLET | Freq: Two times a day (BID) | ORAL | Status: DC | PRN
  Administered 2024-07-23: 0.5 mg via ORAL
  Filled 2024-07-23: qty 1

## 2024-07-23 MED ORDER — PHENYLEPHRINE HCL-NACL 20-0.9 MG/250ML-% IV SOLN
25.0000 ug/min | INTRAVENOUS | Status: DC
  Filled 2024-07-23: qty 250

## 2024-07-23 MED ORDER — FENTANYL CITRATE (PF) 100 MCG/2ML IJ SOLN
INTRAMUSCULAR | Status: AC
  Filled 2024-07-23: qty 2

## 2024-07-23 MED ORDER — ONDANSETRON HCL 4 MG/2ML IJ SOLN: 4.0000 mg | Freq: Four times a day (QID) | INTRAMUSCULAR | Status: DC | PRN

## 2024-07-23 MED ORDER — DOCUSATE SODIUM 100 MG PO CAPS
100.0000 mg | ORAL_CAPSULE | Freq: Two times a day (BID) | ORAL | Status: DC
  Administered 2024-07-23 – 2024-07-25 (×4): 100 mg via ORAL
  Filled 2024-07-23 (×4): qty 1

## 2024-07-23 MED ORDER — ROCURONIUM BROMIDE 10 MG/ML (PF) SYRINGE
PREFILLED_SYRINGE | INTRAVENOUS | Status: DC | PRN
  Administered 2024-07-23: 50 mg via INTRAVENOUS
  Administered 2024-07-23: 20 mg via INTRAVENOUS

## 2024-07-23 MED ORDER — PROCHLORPERAZINE EDISYLATE 10 MG/2ML IJ SOLN: 5.0000 mg | Freq: Four times a day (QID) | INTRAMUSCULAR | Status: DC | PRN

## 2024-07-23 MED ORDER — ROCURONIUM BROMIDE 10 MG/ML (PF) SYRINGE
PREFILLED_SYRINGE | INTRAVENOUS | Status: AC
  Filled 2024-07-23: qty 20

## 2024-07-23 MED ORDER — LIDOCAINE HCL (PF) 1 % IJ SOLN
INTRAMUSCULAR | Status: AC
  Filled 2024-07-23: qty 30

## 2024-07-23 MED ORDER — ALBUTEROL SULFATE HFA 108 (90 BASE) MCG/ACT IN AERS
2.0000 | INHALATION_SPRAY | Freq: Four times a day (QID) | RESPIRATORY_TRACT | Status: DC | PRN
Start: 2024-07-23 — End: 2024-07-23

## 2024-07-23 MED ORDER — TRAMADOL HCL 50 MG PO TABS
50.0000 mg | ORAL_TABLET | Freq: Four times a day (QID) | ORAL | Status: DC | PRN
  Administered 2024-07-24 (×2): 50 mg via ORAL
  Filled 2024-07-23 (×2): qty 1

## 2024-07-23 MED ORDER — ATROPINE SULFATE 0.4 MG/ML IV SOLN
INTRAVENOUS | Status: AC
  Filled 2024-07-23: qty 1

## 2024-07-23 MED ORDER — LIDOCAINE 2% (20 MG/ML) 5 ML SYRINGE
INTRAMUSCULAR | Status: DC | PRN
  Administered 2024-07-23: 60 mg via INTRAVENOUS

## 2024-07-23 MED ORDER — ALBUTEROL SULFATE (2.5 MG/3ML) 0.083% IN NEBU: 2.5000 mg | INHALATION_SOLUTION | Freq: Four times a day (QID) | RESPIRATORY_TRACT | Status: DC | PRN

## 2024-07-23 MED ORDER — FENTANYL CITRATE (PF) 250 MCG/5ML IJ SOLN
INTRAMUSCULAR | Status: AC
  Filled 2024-07-23: qty 5

## 2024-07-23 MED ORDER — CHLORHEXIDINE GLUCONATE 0.12 % MT SOLN
15.0000 mL | Freq: Once | OROMUCOSAL | Status: AC
  Administered 2024-07-23: 15 mL via OROMUCOSAL
  Filled 2024-07-23: qty 15

## 2024-07-23 MED ORDER — AMISULPRIDE (ANTIEMETIC) 5 MG/2ML IV SOLN
INTRAVENOUS | Status: AC
  Filled 2024-07-23: qty 2

## 2024-07-23 MED ORDER — INSULIN GLARGINE-YFGN 100 UNIT/ML ~~LOC~~ SOLN
16.0000 [IU] | Freq: Two times a day (BID) | SUBCUTANEOUS | Status: DC
Start: 2024-07-23 — End: 2024-07-23

## 2024-07-23 MED ORDER — INSULIN GLARGINE 100 UNIT/ML ~~LOC~~ SOLN
16.0000 [IU] | Freq: Two times a day (BID) | SUBCUTANEOUS | Status: DC
  Administered 2024-07-23 – 2024-07-25 (×4): 16 [IU] via SUBCUTANEOUS
  Filled 2024-07-23 (×6): qty 0.16

## 2024-07-23 MED ORDER — ONDANSETRON 4 MG PO TBDP: 4.0000 mg | ORAL_TABLET | Freq: Four times a day (QID) | ORAL | Status: DC | PRN

## 2024-07-23 MED ORDER — PROPOFOL 10 MG/ML IV BOLUS
INTRAVENOUS | Status: AC
  Filled 2024-07-23: qty 20

## 2024-07-23 MED ORDER — FLUOXETINE HCL 10 MG PO CAPS
30.0000 mg | ORAL_CAPSULE | Freq: Every day | ORAL | Status: DC
  Administered 2024-07-24 – 2024-07-25 (×2): 30 mg via ORAL
  Filled 2024-07-23 (×2): qty 1

## 2024-07-23 MED ORDER — FENTANYL CITRATE PF 50 MCG/ML IJ SOSY: 25.0000 ug | PREFILLED_SYRINGE | INTRAMUSCULAR | Status: DC | PRN

## 2024-07-23 MED ORDER — ONDANSETRON HCL 4 MG/2ML IJ SOLN
INTRAMUSCULAR | Status: AC
  Filled 2024-07-23: qty 8

## 2024-07-23 MED ORDER — HYPROMELLOSE (GONIOSCOPIC) 2.5 % OP SOLN: 1.0000 [drp] | Freq: Every day | OPHTHALMIC | Status: DC | PRN

## 2024-07-23 MED ORDER — HEPARIN SODIUM (PORCINE) 5000 UNIT/ML IJ SOLN
5000.0000 [IU] | Freq: Three times a day (TID) | INTRAMUSCULAR | Status: DC
  Administered 2024-07-24 – 2024-07-25 (×4): 5000 [IU] via SUBCUTANEOUS
  Filled 2024-07-23 (×4): qty 1

## 2024-07-23 MED ORDER — FENTANYL CITRATE (PF) 100 MCG/2ML IJ SOLN
25.0000 ug | INTRAMUSCULAR | Status: DC | PRN
  Administered 2024-07-23 (×4): 25 ug via INTRAVENOUS
  Administered 2024-07-23: 50 ug via INTRAVENOUS

## 2024-07-23 MED ORDER — METOPROLOL SUCCINATE ER 50 MG PO TB24: 100.0000 mg | ORAL_TABLET | Freq: Every day | ORAL | Status: DC

## 2024-07-23 MED ORDER — ONDANSETRON HCL 4 MG/2ML IJ SOLN: 4.0000 mg | Freq: Once | INTRAMUSCULAR | Status: DC | PRN

## 2024-07-23 MED ORDER — SODIUM CHLORIDE 0.9 % IR SOLN
Status: DC | PRN
  Administered 2024-07-23: 1000 mL

## 2024-07-23 MED ORDER — AMISULPRIDE (ANTIEMETIC) 5 MG/2ML IV SOLN
INTRAVENOUS | Status: DC | PRN
  Administered 2024-07-23: 5 mg via INTRAVENOUS

## 2024-07-23 MED ORDER — EPHEDRINE 5 MG/ML INJ
INTRAVENOUS | Status: AC
  Filled 2024-07-23: qty 5

## 2024-07-23 MED ORDER — ACETAMINOPHEN 500 MG PO TABS
1000.0000 mg | ORAL_TABLET | ORAL | Status: AC
  Administered 2024-07-23: 1000 mg via ORAL
  Filled 2024-07-23: qty 2

## 2024-07-23 MED ORDER — INSULIN ASPART 100 UNIT/ML IJ SOLN: 0.0000 [IU] | INTRAMUSCULAR | Status: DC | PRN

## 2024-07-23 MED ORDER — CHLORHEXIDINE GLUCONATE CLOTH 2 % EX PADS
6.0000 | MEDICATED_PAD | Freq: Every day | CUTANEOUS | Status: DC
Start: 2024-07-23 — End: 2024-07-25
  Administered 2024-07-24: 6 via TOPICAL

## 2024-07-23 MED ORDER — IRBESARTAN 300 MG PO TABS
300.0000 mg | ORAL_TABLET | Freq: Every day | ORAL | Status: DC
  Administered 2024-07-25: 300 mg via ORAL
  Filled 2024-07-23: qty 1

## 2024-07-23 MED ORDER — INSULIN ASPART 100 UNIT/ML IJ SOLN
0.0000 [IU] | Freq: Three times a day (TID) | INTRAMUSCULAR | Status: DC
  Administered 2024-07-24 (×2): 3 [IU] via SUBCUTANEOUS
  Administered 2024-07-25: 2 [IU] via SUBCUTANEOUS

## 2024-07-23 MED ORDER — ALBUMIN HUMAN 5 % IV SOLN: INTRAVENOUS | Status: DC | PRN

## 2024-07-23 MED ORDER — ACETAMINOPHEN 325 MG PO TABS
650.0000 mg | ORAL_TABLET | Freq: Four times a day (QID) | ORAL | Status: DC
  Administered 2024-07-23 – 2024-07-25 (×6): 650 mg via ORAL
  Filled 2024-07-23 (×6): qty 2

## 2024-07-23 MED ORDER — ONDANSETRON HCL 4 MG/2ML IJ SOLN
INTRAMUSCULAR | Status: DC | PRN
  Administered 2024-07-23: 4 mg via INTRAVENOUS

## 2024-07-23 MED ORDER — PHENYLEPHRINE HCL-NACL 20-0.9 MG/250ML-% IV SOLN
INTRAVENOUS | Status: DC | PRN
  Administered 2024-07-23: 50 ug/min via INTRAVENOUS

## 2024-07-23 MED ORDER — ORAL CARE MOUTH RINSE: 15.0000 mL | Freq: Once | OROMUCOSAL | Status: AC

## 2024-07-23 MED ORDER — ALBUMIN HUMAN 5 % IV SOLN
12.5000 g | Freq: Once | INTRAVENOUS | Status: AC
  Administered 2024-07-23: 12.5 g via INTRAVENOUS

## 2024-07-23 MED ORDER — OXYCODONE HCL 5 MG PO TABS
5.0000 mg | ORAL_TABLET | Freq: Four times a day (QID) | ORAL | Status: DC | PRN
  Administered 2024-07-24 (×2): 5 mg via ORAL
  Filled 2024-07-23 (×2): qty 1

## 2024-07-23 MED ORDER — SIMETHICONE 80 MG PO CHEW: 40.0000 mg | CHEWABLE_TABLET | Freq: Four times a day (QID) | ORAL | Status: DC | PRN

## 2024-07-23 MED ORDER — DIPHENHYDRAMINE HCL 50 MG/ML IJ SOLN: 12.5000 mg | Freq: Four times a day (QID) | INTRAMUSCULAR | Status: DC | PRN

## 2024-07-23 MED ORDER — PROCHLORPERAZINE MALEATE 10 MG PO TABS: 10.0000 mg | ORAL_TABLET | Freq: Four times a day (QID) | ORAL | Status: DC | PRN

## 2024-07-23 MED ORDER — PANTOPRAZOLE SODIUM 40 MG PO TBEC
40.0000 mg | DELAYED_RELEASE_TABLET | Freq: Every day | ORAL | Status: DC
  Administered 2024-07-24 – 2024-07-25 (×2): 40 mg via ORAL
  Filled 2024-07-23 (×2): qty 1

## 2024-07-23 MED ORDER — LIDOCAINE 2% (20 MG/ML) 5 ML SYRINGE
INTRAMUSCULAR | Status: AC
  Filled 2024-07-23: qty 20

## 2024-07-23 MED ORDER — SODIUM CHLORIDE 0.9 % IV SOLN
0.0000 ug/min | INTRAVENOUS | Status: DC
  Filled 2024-07-23: qty 2

## 2024-07-23 MED ORDER — PROPOFOL 500 MG/50ML IV EMUL
INTRAVENOUS | Status: DC | PRN
  Administered 2024-07-23: 25 ug/kg/min via INTRAVENOUS

## 2024-07-23 MED ORDER — SUGAMMADEX SODIUM 200 MG/2ML IV SOLN
INTRAVENOUS | Status: DC | PRN
  Administered 2024-07-23: 100 mg via INTRAVENOUS

## 2024-07-23 MED ORDER — METHOCARBAMOL 500 MG PO TABS
500.0000 mg | ORAL_TABLET | Freq: Four times a day (QID) | ORAL | Status: DC | PRN
  Administered 2024-07-24: 500 mg via ORAL
  Filled 2024-07-23: qty 1

## 2024-07-23 MED ORDER — OXYCODONE HCL 5 MG/5ML PO SOLN: 5.0000 mg | Freq: Once | ORAL | Status: AC | PRN

## 2024-07-23 MED ORDER — EPHEDRINE SULFATE-NACL 50-0.9 MG/10ML-% IV SOSY
PREFILLED_SYRINGE | INTRAVENOUS | Status: DC | PRN
  Administered 2024-07-23 (×4): 5 mg via INTRAVENOUS

## 2024-07-23 MED ORDER — INDOCYANINE GREEN 25 MG IV SOLR
2.5000 mg | Freq: Once | INTRAVENOUS | Status: AC
  Administered 2024-07-23: 2.5 mg via TOPICAL
  Filled 2024-07-23: qty 10

## 2024-07-23 MED ORDER — ALBUMIN HUMAN 5 % IV SOLN
INTRAVENOUS | Status: AC
  Filled 2024-07-23: qty 250

## 2024-07-23 MED ORDER — FENTANYL CITRATE (PF) 250 MCG/5ML IJ SOLN
INTRAMUSCULAR | Status: DC | PRN
  Administered 2024-07-23: 100 ug via INTRAVENOUS

## 2024-07-23 MED ORDER — ACETAMINOPHEN 10 MG/ML IV SOLN
INTRAVENOUS | Status: AC
  Filled 2024-07-23: qty 100

## 2024-07-23 MED ORDER — LIDOCAINE HCL 1 % IJ SOLN
INTRAMUSCULAR | Status: DC | PRN
  Administered 2024-07-23: 17 mL

## 2024-07-23 MED ORDER — PROPOFOL 10 MG/ML IV BOLUS
INTRAVENOUS | Status: AC
Start: 2024-07-23 — End: 2024-07-23
  Filled 2024-07-23: qty 20

## 2024-07-23 MED ORDER — DEXTROSE 50 % IV SOLN
INTRAVENOUS | Status: AC
  Administered 2024-07-23: 12.5 g via INTRAVENOUS
  Filled 2024-07-23: qty 50

## 2024-07-23 MED ORDER — PHENYLEPHRINE 80 MCG/ML (10ML) SYRINGE FOR IV PUSH (FOR BLOOD PRESSURE SUPPORT)
PREFILLED_SYRINGE | INTRAVENOUS | Status: AC
  Filled 2024-07-23: qty 20

## 2024-07-23 MED ORDER — DEXAMETHASONE SODIUM PHOSPHATE 10 MG/ML IJ SOLN
INTRAMUSCULAR | Status: AC
  Filled 2024-07-23: qty 4

## 2024-07-23 SURGICAL SUPPLY — 91 items
APPLICATOR VISTASEAL 35 (MISCELLANEOUS) IMPLANT
BAG COUNTER SPONGE SURGICOUNT (BAG) ×1 IMPLANT
BAG DRAIN URO-CYSTO SKYTR STRL (DRAIN) ×1 IMPLANT
BAG URINE DRAIN 2000ML AR STRL (UROLOGICAL SUPPLIES) IMPLANT
BLADE CLIPPER SURG (BLADE) IMPLANT
BLADE SURG 10 STRL SS (BLADE) IMPLANT
CANISTER SUCTION 3000ML PPV (SUCTIONS) ×1 IMPLANT
CATH FOLEY 2WAY SLVR 5CC 16FR (CATHETERS) IMPLANT
CATH KIT ON-Q SILVERSOAK 5 (CATHETERS) ×2 IMPLANT
CATH URET 5FR 28IN OPEN ENDED (CATHETERS) IMPLANT
CHLORAPREP W/TINT 26 (MISCELLANEOUS) ×1 IMPLANT
CLIP APPLIE ROT 10 11.4 M/L (STAPLE) ×1 IMPLANT
CLIP TI LARGE 6 (CLIP) ×1 IMPLANT
CLIP TI MEDIUM 6 (CLIP) ×1 IMPLANT
COVER SURGICAL LIGHT HANDLE (MISCELLANEOUS) ×1 IMPLANT
DERMABOND ADVANCED .7 DNX12 (GAUZE/BANDAGES/DRESSINGS) ×1 IMPLANT
DRAIN CHANNEL 19F RND (DRAIN) ×1 IMPLANT
DRAPE LAPAROSCOPIC ABDOMINAL (DRAPES) IMPLANT
DRAPE WARM FLUID 44X44 (DRAPES) ×1 IMPLANT
DRSG COVADERM 4X10 (GAUZE/BANDAGES/DRESSINGS) IMPLANT
DRSG COVADERM 4X14 (GAUZE/BANDAGES/DRESSINGS) IMPLANT
ELECT BLADE 6.5 EXT (BLADE) IMPLANT
ELECT CAUTERY BLADE 6.4 (BLADE) ×1 IMPLANT
ELECTRODE REM PT RTRN 9FT ADLT (ELECTROSURGICAL) ×1 IMPLANT
EVACUATOR SILICONE 100CC (DRAIN) IMPLANT
GAUZE SPONGE 4X4 12PLY STRL (GAUZE/BANDAGES/DRESSINGS) IMPLANT
GLOVE BIO SURGEON STRL SZ 6 (GLOVE) ×1 IMPLANT
GLOVE BIO SURGEON STRL SZ7.5 (GLOVE) ×1 IMPLANT
GLOVE INDICATOR 6.5 STRL GRN (GLOVE) ×1 IMPLANT
GOWN STRL REUS W/ TWL LRG LVL3 (GOWN DISPOSABLE) ×2 IMPLANT
GOWN STRL REUS W/ TWL XL LVL3 (GOWN DISPOSABLE) ×1 IMPLANT
GRASPER SUT TROCAR 14GX15 (MISCELLANEOUS) IMPLANT
GUIDEWIRE ANG ZIPWIRE 038X150 (WIRE) IMPLANT
GUIDEWIRE STR DUAL SENSOR (WIRE) ×1 IMPLANT
HAND PENCIL TRP OPTION (MISCELLANEOUS) IMPLANT
HEMOSTAT SURGICEL 2X4 FIBR (HEMOSTASIS) IMPLANT
INSTRUMENT COAXIAL 18GX15CM (NEEDLE) IMPLANT
IRRIGATION SUCT STRKRFLW 2 WTP (MISCELLANEOUS) ×1 IMPLANT
KIT BASIN OR (CUSTOM PROCEDURE TRAY) ×1 IMPLANT
KIT IMAGING PINPOINTPAQ (MISCELLANEOUS) IMPLANT
KIT TURNOVER KIT B (KITS) ×1 IMPLANT
LHOOK LAP DISP 36CM (ELECTROSURGICAL) ×1 IMPLANT
MANIFOLD NEPTUNE II (INSTRUMENTS) ×3 IMPLANT
NDL COAXIAL INTRO 17GX11.8 (NEEDLE) IMPLANT
NEEDLE COAXIAL INTRO 17GX11.8 (NEEDLE) IMPLANT
NS IRRIG 1000ML POUR BTL (IV SOLUTION) ×2 IMPLANT
PACK CYSTO (CUSTOM PROCEDURE TRAY) ×1 IMPLANT
PAD ARMBOARD POSITIONER FOAM (MISCELLANEOUS) ×2 IMPLANT
PENCIL BUTTON HOLSTER BLD 10FT (ELECTRODE) ×1 IMPLANT
PROBE LAPAROSCOPIC 5MM W/FTSWT (MISCELLANEOUS) IMPLANT
RELOAD STAPLE 60 2.6 WHT THN (STAPLE) IMPLANT
SCISSORS LAP 5X35 DISP (ENDOMECHANICALS) ×1 IMPLANT
SET TUBE SMOKE EVAC HIGH FLOW (TUBING) ×1 IMPLANT
SHEARS 1100 HARMONIC 36 (ELECTROSURGICAL) IMPLANT
SLEEVE Z-THREAD 5X100MM (TROCAR) ×2 IMPLANT
SPECIMEN JAR SMALL (MISCELLANEOUS) ×1 IMPLANT
SPIKE FLUID TRANSFER (MISCELLANEOUS) ×1 IMPLANT
STAPLE ECHEON FLEX 60 POW ENDO (STAPLE) IMPLANT
STAPLER SKIN PROX 35W (STAPLE) IMPLANT
STENT URET 6FRX24 CONTOUR (STENTS) IMPLANT
STENT URET 6FRX26 CONTOUR (STENTS) IMPLANT
SUT ETHILON 2 0 FS 18 (SUTURE) ×1 IMPLANT
SUT MNCRL AB 4-0 PS2 18 (SUTURE) ×1 IMPLANT
SUT PDS AB 1 TP1 96 (SUTURE) IMPLANT
SUT PDS II 0 TP-1 LOOPED 60 (SUTURE) IMPLANT
SUT PROLENE 3 0 SH 48 (SUTURE) ×2 IMPLANT
SUT PROLENE 4-0 RB1 .5 CRCL 36 (SUTURE) ×2 IMPLANT
SUT VIC AB 2-0 SH 18 (SUTURE) ×1 IMPLANT
SUT VIC AB 3-0 SH 18 (SUTURE) ×1 IMPLANT
SUT VICRYL 0 UR6 27IN ABS (SUTURE) IMPLANT
SUT VICRYL AB 2 0 TIES (SUTURE) ×1 IMPLANT
SUT VICRYL AB 3 0 TIES (SUTURE) ×1 IMPLANT
SYPHON OMNI JUG (MISCELLANEOUS) ×1 IMPLANT
SYSTEM BAG RETRIEVAL 10MM (BASKET) ×1 IMPLANT
SYSTEM LAPSCP GELPORT 120MM (MISCELLANEOUS) IMPLANT
TOWEL GREEN STERILE (TOWEL DISPOSABLE) ×1 IMPLANT
TOWEL GREEN STERILE FF (TOWEL DISPOSABLE) ×1 IMPLANT
TRAY FOLEY MTR SLVR 14FR STAT (SET/KITS/TRAYS/PACK) ×1 IMPLANT
TRAY FOLEY W/BAG SLVR 16FR ST (SET/KITS/TRAYS/PACK) IMPLANT
TRAY LAPAROSCOPIC MC (CUSTOM PROCEDURE TRAY) ×1 IMPLANT
TROCAR 11X100 Z THREAD (TROCAR) ×1 IMPLANT
TROCAR BALLN 12MMX100 BLUNT (TROCAR) ×1 IMPLANT
TROCAR Z THREAD OPTICAL 12X100 (TROCAR) IMPLANT
TROCAR Z-THREAD OPTICAL 5X100M (TROCAR) ×1 IMPLANT
TUBE CONNECTING 12X1/4 (SUCTIONS) ×1 IMPLANT
TUBING EVAC SMOKE HEATED PNEUM (TUBING) IMPLANT
TUNNELER SHEATH ON-Q 16GX12 DP (PAIN MANAGEMENT) ×1 IMPLANT
WARMER LAPAROSCOPE (MISCELLANEOUS) ×1 IMPLANT
WATER STERILE IRR 1000ML POUR (IV SOLUTION) ×1 IMPLANT
WATER STERILE IRR 3000ML UROMA (IV SOLUTION) ×1 IMPLANT
YANKAUER SUCT BULB TIP NO VENT (SUCTIONS) IMPLANT

## 2024-07-23 NOTE — Anesthesia Procedure Notes (Signed)
 Procedure Name: Intubation Date/Time: 07/23/2024 4:22 PM  Performed by: Virgil Ee, CRNAPre-anesthesia Checklist: Patient identified, Patient being monitored, Timeout performed, Emergency Drugs available and Suction available Patient Re-evaluated:Patient Re-evaluated prior to induction Oxygen Delivery Method: Circle system utilized Preoxygenation: Pre-oxygenation with 100% oxygen Induction Type: IV induction Ventilation: Mask ventilation without difficulty Laryngoscope Size: Mac and 4 Grade View: Grade I Tube type: Oral Tube size: 7.0 mm Number of attempts: 1 Airway Equipment and Method: Stylet Placement Confirmation: ETT inserted through vocal cords under direct vision, positive ETCO2 and breath sounds checked- equal and bilateral Secured at: 22 cm Tube secured with: Tape Dental Injury: Teeth and Oropharynx as per pre-operative assessment

## 2024-07-23 NOTE — Transfer of Care (Signed)
 Immediate Anesthesia Transfer of Care Note  Patient: Diana Pratt  Procedure(s) Performed: LAPAROSCOPIC CHOLECYSTECTOMY BIOPSY, LIVER  Patient Location: PACU  Anesthesia Type:General  Level of Consciousness: awake, alert , and oriented  Airway & Oxygen Therapy: Patient Spontanous Breathing  Post-op Assessment: Report given to RN and Post -op Vital signs reviewed and stable  Post vital signs: Reviewed and stable. Patient hypotensive in PACU, Neo infusion restarted, and CRNA stayed with patient in PACU until BP improved.  Last Vitals:  Vitals Value Taken Time  BP 94/73 07/23/24 18:25  Temp    Pulse 45 07/23/24 18:25  Resp 18 07/23/24 18:25  SpO2 92 % 07/23/24 18:25  Vitals shown include unfiled device data.  Last Pain:  Vitals:   07/23/24 1811  TempSrc:   PainSc: 4       Patients Stated Pain Goal: 0 (07/23/24 1218)  Complications: No notable events documented.

## 2024-07-23 NOTE — Discharge Instructions (Addendum)
Laparoscopic Cholecystectomy Care After Refer to this sheet in the next few weeks. These instructions provide you with information on caring for yourself after your procedure. Your caregiver may also give you more specific instructions. Your treatment has been planned according to current medical practices, but problems sometimes occur. Call your caregiver if you have any problems or questions after your procedure. HOME CARE INSTRUCTIONS   The surgical glue will eventually flake off in 2-3 weeks.    You may shower in 48 hours.  The wound may be washed gently with soap and water. Gently blot or dab the area dry.   Do not take baths or use swimming pools or hot tubs for 14 days, or as instructed by your caregiver.   Only take over-the-counter or prescription medicines for pain, discomfort, or fever as directed by your caregiver. DO NOT mix acetaminophen (tylenol) with the pain medication.    Continue your normal diet as directed by your caregiver.   Do not lift anything heavier than 15 pounds (11.5 kg) for 2-4 weeks.  Do not play contact sports for 4 weeks, or as directed by your caregiver.  SEEK MEDICAL CARE IF:   There is redness, swelling, or increasing pain in the wound.   You notice yellowish-white fluid (pus) coming from the wound.   There is drainage from the wound that lasts longer than 1 day.   There is a bad smell coming from the wound or dressing.   The surgical cut (incision) breaks open.  SEEK IMMEDIATE MEDICAL CARE IF:   You develop a rash.   You have difficulty breathing.   You develop chest pain.   You develop any reaction or side effects to medicines given.   You have a fever.  Your skin or eyes are yellow.  You have increasing pain in the shoulders (shoulder strap areas).   You have dizzy episodes or faint while standing.   You develop severe abdominal pain.   You feel sick to your stomach (nauseous) or throw up (vomit) and this lasts for more than 1  day.  MAKE SURE YOU:   Understand these instructions.   Will watch your condition.   Will get help right away if you are not doing well or get worse.

## 2024-07-23 NOTE — Op Note (Signed)
 PRE-OPERATIVE DIAGNOSIS: chronic calculous cholecystitis, cirrhosis  POST-OPERATIVE DIAGNOSIS:  Same  PROCEDURE:  Procedure(s): Laparoscopic cholecystectomy with ICG cholangiography, core needle liver biopsy  SURGEON:  Surgeon(s): Jina Nephew, MD  ASSISTANT:   Leonor Dawn, MD  ANESTHESIA:   general  DRAINS: none   LOCAL MEDICATIONS USED:  BUPIVICAINE  and LIDOCAINE    SPECIMEN:  Source of Specimen:  gallbladder  DISPOSITION OF SPECIMEN:  PATHOLOGY  COUNTS:  YES  DICTATION: .Dragon Dictation  PLAN OF CARE: Admit for overnight observation  PATIENT DISPOSITION:  PACU - hemodynamically stable.  FINDINGS:    EBL: min  PROCEDURE:  Patient was identified in the holding area and taken the operating room where she was placed supine on the operating room table.  Sequential compression devices were placed.  General anesthesia was induced.  Patient's abdomen was prepped and draped in sterile fashion.  Timeout was performed according to the surgical safety checklist.  When all was correct, we continued.   Local anesthetic agent was injected into the skin near the umbilicus and a transverse infraumbilical incision was made with a #11 blade. I dissected down to the abdominal fascia with blunt dissection.  The fascia was incised vertically and the peritoneal cavity was entered bluntly.  A pursestring suture of 0-Vicryl was placed around the fascial opening.  The Hasson cannula was inserted and secured with the stay suture.  Pneumoperitoneum was then created with CO2 and tolerated well without any adverse changes in the patient's vital signs. An 11-mm port was placed in the subxiphoid position.  Two 5-mm ports were placed in the right upper quadrant.  A fifth 5 mm port was placed in the midline all skin incisions were infiltrated with a local anesthetic agent before making the incision and placing the trocars.   The patient was placed into reverse Trendelenburg position and was tilted  slightly to the patient's left.  The liver was noted to be extremely cirrhotic.  A core needle biopsy was obtained via the right upper quadrant percutaneously.  Hemostasis was obtained with the cautery.    The gallbladder was identified, and there were some omental adhesions noted.  Adhesions were lysed bluntly and with the electrocautery where indicated, taking care not to injure any adjacent organs or viscus.   The fundus of the gallbladder was then grasped and retracted cephalad.  The infundibulum was grasped and retracted laterally, exposing the peritoneum overlying the triangle of Calot. The cystic duct and cystic artery were identified.  These were both skeletonized.  The cystic artery was overlying the cystic duct and so it was divided.  The near infrared light was then used to confirm the cystic duct.  There was some bile leakage from a small entrance into the gallbladder and so this also showed green higher up on the gallbladder.  A window between the gallbladder and the liver was obtained to ensure a critical view.  The cystic duct was then ligated distally with one clip and proximally with three clips and divided.   The gallbladder was dissected from the liver bed in retrograde fashion with the electrocautery.  It was extremely inflamed.  The gallbladder was removed and placed in an Endo Catch bag.  The gallbladder and bag were then removed through the umbilical port site.  The liver bed was irrigated and inspected. Hemostasis was achieved with the electrocautery.  The argon beam coagulator was used to coagulate the gallbladder fossa.  Fibrillar was placed in the gallbladder fossa as well.  Copious irrigation was  utilized and was repeatedly aspirated until clear.  A core needle biopsy gun was used to obtain a liver biopsy.  The argon beam was used to coagulate the superficial defect on the liver.  Pneumoperitoneum was released as we removed the trocars.   The pursestring suture was used to close the  umbilical fascia.  2 additional 0-0 vicryl sutures were needed to close the fascial defect.  A PMI suture passer was used to facilitate this.    4-0 Monocryl was used to close the skin in subcuticular fashion.   The skin was cleaned and dry, and Dermabond was applied. The patient was then extubated and brought to the recovery room in stable condition. Instrument, sponge, and needle counts were correct at closure and at the conclusion of the case.

## 2024-07-23 NOTE — Interval H&P Note (Signed)
 History and Physical Interval Note:  07/23/2024 12:41 PM  Diana Pratt  has presented today for surgery, with the diagnosis of CIRRHOSIS CHRONIC CALCULOUS CHOLECYSTITIS.  The various methods of treatment have been discussed with the patient and family. After consideration of risks, benefits and other options for treatment, the patient has consented to  Procedure(s) with comments: LAPAROSCOPIC CHOLECYSTECTOMY (N/A) - POSSIBLE OPEN--WITH ICG BIOPSY, LIVER (N/A) as a surgical intervention.  The patient's history has been reviewed, patient examined, no change in status, stable for surgery.  I have reviewed the patient's chart and labs.  Questions were answered to the patient's satisfaction.     Jina Nephew

## 2024-07-24 ENCOUNTER — Encounter (HOSPITAL_COMMUNITY): Payer: Self-pay | Admitting: General Surgery

## 2024-07-24 LAB — GLUCOSE, CAPILLARY
Glucose-Capillary: 171 mg/dL — ABNORMAL HIGH (ref 70–99)
Glucose-Capillary: 191 mg/dL — ABNORMAL HIGH (ref 70–99)
Glucose-Capillary: 112 mg/dL — ABNORMAL HIGH (ref 70–99)
Glucose-Capillary: 183 mg/dL — ABNORMAL HIGH (ref 70–99)

## 2024-07-24 LAB — COMPREHENSIVE METABOLIC PANEL WITH GFR
ALT: 67 U/L — ABNORMAL HIGH (ref 0–44)
AST: 114 U/L — ABNORMAL HIGH (ref 15–41)
Albumin: 3.3 g/dL — ABNORMAL LOW (ref 3.5–5.0)
Alkaline Phosphatase: 51 U/L (ref 38–126)
Anion gap: 7 (ref 5–15)
BUN: 11 mg/dL (ref 8–23)
CO2: 22 mmol/L (ref 22–32)
Calcium: 8.3 mg/dL — ABNORMAL LOW (ref 8.9–10.3)
Chloride: 106 mmol/L (ref 98–111)
Creatinine, Ser: 0.94 mg/dL (ref 0.44–1.00)
GFR, Estimated: >60 mL/min (ref >60)
Glucose, Bld: 203 mg/dL — ABNORMAL HIGH (ref 70–99)
Potassium: 4.2 mmol/L (ref 3.5–5.1)
Sodium: 135 mmol/L (ref 135–145)
Total Bilirubin: 0.6 mg/dL (ref 0.0–1.2)
Total Protein: 5.9 g/dL — ABNORMAL LOW (ref 6.5–8.1)

## 2024-07-24 LAB — CBC
HCT: 36.4 % (ref 36.0–46.0)
Hemoglobin: 12.1 g/dL (ref 12.0–15.0)
MCH: 30.3 pg (ref 26.0–34.0)
MCHC: 33.2 g/dL (ref 30.0–36.0)
MCV: 91 fL (ref 80.0–100.0)
Platelets: 144 K/uL — ABNORMAL LOW (ref 150–400)
RBC: 4 MIL/uL (ref 3.87–5.11)
RDW: 12.8 % (ref 11.5–15.5)
WBC: 6.3 K/uL (ref 4.0–10.5)
nRBC: 0 % (ref 0.0–0.2)

## 2024-07-24 LAB — MRSA NEXT GEN BY PCR, NASAL: MRSA by PCR Next Gen: NOT DETECTED

## 2024-07-24 MED ORDER — METOPROLOL SUCCINATE ER 25 MG PO TB24
50.0000 mg | ORAL_TABLET | Freq: Every day | ORAL | Status: DC
  Administered 2024-07-24: 50 mg via ORAL
  Filled 2024-07-24: qty 1
  Filled 2024-07-24: qty 2

## 2024-07-24 MED ORDER — OXYCODONE HCL 5 MG PO TABS
5.0000 mg | ORAL_TABLET | Freq: Four times a day (QID) | ORAL | Status: DC | PRN
  Administered 2024-07-24 – 2024-07-25 (×3): 5 mg via ORAL
  Filled 2024-07-24 (×3): qty 1

## 2024-07-24 NOTE — Anesthesia Postprocedure Evaluation (Signed)
 Anesthesia Post Note  Patient: Diana Pratt  Procedure(s) Performed: LAPAROSCOPIC CHOLECYSTECTOMY BIOPSY, LIVER     Patient location during evaluation: PACU Anesthesia Type: General Level of consciousness: awake Pain management: pain level controlled Vital Signs Assessment: post-procedure vital signs reviewed and stable Respiratory status: spontaneous breathing, nonlabored ventilation and respiratory function stable Cardiovascular status: blood pressure returned to baseline and stable Postop Assessment: no apparent nausea or vomiting Anesthetic complications: no   No notable events documented.  Last Vitals:  Vitals:   07/24/24 1345 07/24/24 1400  BP: (!) 113/45 (!) 107/46  Pulse: 63 62  Resp: 18 16  Temp:  36.7 C  SpO2: 94% 95%    Last Pain:  Vitals:   07/24/24 1400  TempSrc: Oral  PainSc: 8    Pain Goal: Patients Stated Pain Goal: 0 (07/23/24 1218)                 Daneen Volcy P Zaccary Creech

## 2024-07-24 NOTE — Progress Notes (Signed)
 1 Day Post-Op   Subjective/Chief Complaint: Patient has some burning pain at umbilicus.  One 5 mg oxycodone  tab brought pain from a level of 8 down to a level of 5.  Feels unsteady on her feet.  Required transfer to ICU because of hypotension requiring Neo-Synephrine.  Was unable to wean off of neo in the PACU.  Was able to wean off of this overnight. Still has a feeling of fullness in the upper abdomen.  Denies nausea and vomiting.  Objective: Vital signs in last 24 hours: Temp:  [97.6 F (36.4 C)-98.5 F (36.9 C)] 97.6 F (36.4 C) (09/19 0815) Pulse Rate:  [51-91] 69 (09/19 0855) Resp:  [8-24] 16 (09/19 0855) BP: (77-188)/(27-132) 120/43 (09/19 0855) SpO2:  [91 %-100 %] 95 % (09/19 0855) Weight:  [89.1 kg-91.9 kg] 91.9 kg (09/18 2200) Last BM Date :  (PTA)  Intake/Output from previous day: 09/18 0701 - 09/19 0700 In: 2178.3 [I.V.:1521.7; IV Piggyback:656.5] Out: 150 [Urine:100; Blood:50] Intake/Output this shift: No intake/output data recorded.  General:  alert and oriented CV:  RR&R Resp:  breathing comfortably Abd:  soft, approp tender.  Protuberant, sl distended.    Lab Results:  Recent Labs    07/23/24 1902 07/24/24 0523  WBC 7.6 6.3  HGB 10.7* 12.1  HCT 32.3* 36.4  PLT 163 144*   BMET Recent Labs    07/23/24 2237 07/24/24 0523  NA  --  135  K  --  4.2  CL  --  106  CO2  --  22  GLUCOSE  --  203*  BUN  --  11  CREATININE 0.74 0.94  CALCIUM   --  8.3*   PT/INR No results for input(s): LABPROT, INR in the last 72 hours. ABG No results for input(s): PHART, HCO3 in the last 72 hours.  Invalid input(s): PCO2, PO2  Studies/Results: No results found.  Anti-infectives: Anti-infectives (From admission, onward)    Start     Dose/Rate Route Frequency Ordered Stop   07/24/24 0600  ciprofloxacin  (CIPRO ) IVPB 400 mg        400 mg 200 mL/hr over 60 Minutes Intravenous On call to O.R. 07/23/24 1214 07/23/24 1623   07/23/24 2245  ceFAZolin   (ANCEF ) IVPB 2g/100 mL premix        2 g 200 mL/hr over 30 Minutes Intravenous Every 8 hours 07/23/24 2154 07/23/24 2323       Assessment/Plan: s/p Procedure(s) with comments: LAPAROSCOPIC CHOLECYSTECTOMY (N/A) - POSSIBLE OPEN--WITH ICG BIOPSY, LIVER (N/A) POD 1 Holding some antihypertensives. Get PT consult due to unsteadiness. Increase oxycodone  to 1-2 tabs every 6. Anticipate patient will likely need an additional night the hospital. Patient is medically frail given her significant diabetes with significant insulin  needs, labile blood pressure, cirrhosis, and unsteadiness.  Transfer to floor   LOS: 1 day    Diana Pratt 07/24/2024

## 2024-07-24 NOTE — Progress Notes (Signed)
 Pt transferred to 6 [address] 19. Bedside handoff with Shedonna complete. Glasses and clothing transferred with pt. Pt will notify her family of her new room number.

## 2024-07-24 NOTE — Progress Notes (Signed)
 Patient arrived to the floor at approximately 1415

## 2024-07-25 LAB — GLUCOSE, CAPILLARY: Glucose-Capillary: 132 mg/dL — ABNORMAL HIGH (ref 70–99)

## 2024-07-25 MED ORDER — TRAMADOL HCL 50 MG PO TABS: 50.0000 mg | ORAL_TABLET | Freq: Four times a day (QID) | ORAL | 0 refills | Status: DC | PRN

## 2024-07-25 NOTE — Care Management (Cosign Needed)
    Durable Medical Equipment  (From admission, onward)           Start     Ordered   07/25/24 1103  For home use only DME Walker rolling  Once       Question Answer Comment  Walker: With 5 Inch Wheels   Patient needs a walker to treat with the following condition Weakness      07/25/24 1103           Per PT recommendations.

## 2024-07-25 NOTE — Evaluation (Signed)
 Physical Therapy Brief Evaluation and Discharge Note Patient Details Name: Diana Pratt MRN: [id number] DOB: 1946-02-01 Today's Date: 07/25/2024   History of Present Illness  78 y.o. female admitted 9/18 and udnerwent Laparoscopic cholecystectomy with ICG cholangiography, core needle liver biopsy. PMH: former smoker (quit 06/01/1994), post-operative N/v, HTN, DM2 (with neuropathy), HLD, asthma, COPD, dyspnea, GERD, skin cancer (s/p excision), liver cirrhosis, interstitial cystitis, anxiety, hysterectomy   Clinical Impression  Patient is s/p above surgery presenting with functional limitations due to the deficits listed below (see PT Problem List). Previously independent, driving, lives with husband, and has 24/7 supervision available at d/c if needed. She was able to mobilize grossly at a supervision level. Demonstrates some mild instability with gait, correctly nicely with appropriate use of a RW. All questions answered. May follow-up with OPPT in [place] if she feels her functional recovery is slow. Patient will benefit from acute skilled PT to increase their independence and safety with mobility to facilitate discharge.        PT Assessment Patient does not need any further PT services  Assistance Needed at Discharge  Set up Supervision/Assistance    Equipment Recommendations Rolling walker (2 wheels)  Recommendations for Other Services       Precautions/Restrictions Precautions Precautions: Fall Recall of Precautions/Restrictions: Intact Restrictions Weight Bearing Restrictions Per Provider Order: No        Mobility  Bed Mobility Rolling: Modified independent (Device/Increase time) Supine/Sidelying to sit: Modified independent (Device/Increased time)   General bed mobility comments: Cues for technique for comfort. Performed on her own with extra time and effort.  Transfers Overall transfer level: Needs assistance Equipment used: Rolling walker (2 wheels),  None Transfers: Sit to/from Stand Sit to Stand: Supervision           General transfer comment: Supervision for safety, guarded upon standing without AD. Performed again with RW and appears more steady and confident.    Ambulation/Gait Ambulation/Gait assistance: Supervision Gait Distance (Feet): 100 Feet Assistive device: Rolling walker (2 wheels), None Gait Pattern/deviations: Step-through pattern Gait Speed: Below normal General Gait Details: Slower and guarded but without instability while using RW for support. When RW removed, pt shows some increased sway, not as confident. Prefers UE support. Educated on appropriate use.  Home Activity Instructions Home Activity Instructions: Increase activity slowly. OOB often.  Stairs            Modified Rankin (Stroke Patients Only)        Balance Overall balance assessment: Needs assistance Sitting-balance support: No upper extremity supported, Feet supported Sitting balance-Leahy Scale: Good     Standing balance support: No upper extremity supported Standing balance-Leahy Scale: Fair Standing balance comment: More steady with AD for support          Pertinent Vitals/Pain PT - Brief Vital Signs All Vital Signs Stable:  (HR 90s at rest) Pain Assessment Pain Assessment: Faces Faces Pain Scale: Hurts little more Pain Location: Incisional area Pain Descriptors / Indicators: Sore Pain Intervention(s): Limited activity within patient's tolerance, Monitored during session, Repositioned     Home Living Family/patient expects to be discharged to:: Private residence Living Arrangements: Spouse/significant other Available Help at Discharge: Family;Available 24 hours/day Home Environment: Level entry   Home Equipment: Grab bars - tub/shower;Grab bars - toilet;Shower seat        Prior Function Level of Independence: Independent Comments: Drives, retired    UE/LE Assessment   UE ROM/Strength/Tone/Coordination:  WFL    LE ROM/Strength/Tone/Coordination: Generalized weakness  Communication   Communication Communication: No apparent difficulties     Cognition Overall Cognitive Status: Appears within functional limits for tasks assessed/performed       General Comments General comments (skin integrity, edema, etc.): Subjectively feels drowsy. Has good awareness. Agreeable to use RW at d/c. Has been to OPPT in [place] before and made great progress for prior issues. Open to seeing therapist there if her function/gait is slow to recover.    Exercises     Assessment/Plan    PT Problem List         PT Visit Diagnosis Unsteadiness on feet (R26.81);Other abnormalities of gait and mobility (R26.89);Muscle weakness (generalized) (M62.81);Pain    No Skilled PT All education completed;Patient will have necessary level of assist by caregiver at discharge;Patient is supervision for all activity/mobility   Co-evaluation                AMPAC 6 Clicks Help needed turning from your back to your side while in a flat bed without using bedrails?: None Help needed moving from lying on your back to sitting on the side of a flat bed without using bedrails?: None Help needed moving to and from a bed to a chair (including a wheelchair)?: A Little Help needed standing up from a chair using your arms (e.g., wheelchair or bedside chair)?: A Little Help needed to walk in hospital room?: A Little Help needed climbing 3-5 steps with a railing? : A Little 6 Click Score: 20      End of Session   Activity Tolerance: Patient tolerated treatment well Patient left: in chair;with call bell/phone within reach Nurse Communication: Mobility status PT Visit Diagnosis: Unsteadiness on feet (R26.81);Other abnormalities of gait and mobility (R26.89);Muscle weakness (generalized) (M62.81);Pain Pain - Right/Left: Right Pain - part of body:  (abdomen)     Time: 9156-9141 PT Time Calculation (min) (ACUTE  ONLY): 15 min  Charges:   PT Evaluation $PT Eval Low Complexity: 1 Low      Diana Pratt, PT, DPT [facility]  Rehabilitation Services Physical Therapist Office: [phone] Website: [email or web address]   Diana Pratt  07/25/2024, 9:11 AM

## 2024-07-25 NOTE — TOC Transition Note (Signed)
 Transition of Care [facility]) - Discharge Note   Patient Details  Name: Diana Pratt MRN: [id number] Date of Birth: Oct 10, 1946  [facility] CM/SW Contact:  Robynn Eileen Hoose, RN Phone Number: 07/25/2024, 11:05 AM   Clinical Narrative:   Patient is being discharged. Secure message received regarding patient needing rolling walker. RW ordered through Jermaine with [employer] to be delivered to the bedside.    Final next level of care: Home/Self Care Barriers to Discharge: No Barriers Identified   Patient Goals and CMS Choice            Discharge Placement                       Discharge Plan and Services Additional resources added to the After Visit Summary for                  DME Arranged: Walker rolling DME Agency: [employer] Date DME Agency Contacted: 07/25/24 Time DME Agency Contacted: 1100 Representative spoke with at DME Agency: [place]            Social Drivers of Health (SDOH) Interventions SDOH Screenings   Food Insecurity: No Food Insecurity (07/24/2024)  Housing: Low Risk  (07/24/2024)  Transportation Needs: No Transportation Needs (07/24/2024)  Utilities: Not At Risk (07/24/2024)  Alcohol Screen: Low Risk  (08/15/2023)  Depression (PHQ2-9): Low Risk  (05/11/2024)  Financial Resource Strain: Low Risk  (08/15/2023)  Physical Activity: Inactive (08/15/2023)  Social Connections: Moderately Integrated (07/24/2024)  Stress: No Stress Concern Present (08/15/2023)  Tobacco Use: Medium Risk (07/23/2024)  Health Literacy: Adequate Health Literacy (08/15/2023)     Readmission Risk Interventions     No data to display

## 2024-07-25 NOTE — Discharge Summary (Signed)
 Physician Discharge Summary   Patient ID: Diana Pratt MRN: [id number] DOB/AGE: [birth year] 78 y.o.  Admit date: 07/23/2024  Discharge date: 07/25/2024  Discharge Diagnoses:  Principal Problem:   Chronic calculous cholecystitis Active Problems:   S/P laparoscopic cholecystectomy   Discharged Condition: good  Hospital Course: Patient was admitted for observation following cholecystectomy and liver biopsy.  Post op course was uncomplicated.  Pain was well controlled.  Tolerated diet.  Patient was prepared for discharge home on POD#2.  Consults: None  Treatments: surgery: lap cholecystectomy, liver biopsy  Discharge Exam: Blood pressure (!) 127/55, pulse 78, temperature 98.2 F (36.8 C), temperature source Oral, resp. rate 19, height 5' 4 (1.626 m), weight 91.9 kg, SpO2 93%. HEENT - clear Abd - soft, obese; wounds dry and intact with Dermabond  Disposition: Home  Discharge Instructions     Diet - low sodium heart healthy   Complete by: As directed    Increase activity slowly   Complete by: As directed    No dressing needed   Complete by: As directed       Allergies as of 07/25/2024       Reactions   Crestor  [rosuvastatin ]    Myalgias   Lipitor [atorvastatin ] Other (See Comments)   Myalgias   Livalo  [pitavastatin ] Other (See Comments)   myalgias   Morphine  And Codeine Itching   Burning, itching   Penicillins Hives, Itching   Simvastatin Other (See Comments)   myalgias   Sulfa Antibiotics Hives, Itching   Zetia [ezetimibe] Cough        Medication List     TAKE these medications    albuterol  108 (90 Base) MCG/ACT inhaler Commonly known as: ProAir  HFA Inhale 2 puffs into the lungs every 6 (six) hours as needed for shortness of breath.   amLODipine  10 MG tablet Commonly known as: NORVASC  Take 1 tablet (10 mg total) by mouth daily.   calcium  carbonate 500 MG chewable tablet Commonly known as: TUMS - dosed in mg elemental calcium  Chew 3 tablets  by mouth daily.   CENTRUM SILVER PO Take 1 tablet by mouth daily.   Dexcom G7 Receiver Devi UAD to continuously check BGs. E11.69   Dexcom G7 Sensor Misc Check BGs continuosly. Change every 10 days. E11.69   docusate sodium  100 MG capsule Commonly known as: Colace Take 1 capsule (100 mg total) by mouth 2 (two) times daily.   FLUoxetine  10 MG capsule Commonly known as: PROZAC  Take 3 capsules (30 mg total) by mouth daily.   fluticasone  50 MCG/ACT nasal spray Commonly known as: FLONASE  Place 2 sprays into both nostrils daily.   hydrochlorothiazide  25 MG tablet Commonly known as: HYDRODIURIL  Take 1 tablet (25 mg total) by mouth daily.   hydroxypropyl methylcellulose / hypromellose 2.5 % ophthalmic solution Commonly known as: ISOPTO TEARS / GONIOVISC Place 1 drop into both eyes daily as needed for dry eyes.   Lantus  SoloStar 100 UNIT/ML Solostar Pen Generic drug: insulin  glargine Inject 32 units in the morning and 23 units in the evening. What changed:  how much to take how to take this when to take this additional instructions   loratadine  10 MG tablet Commonly known as: CLARITIN  Take 1 tablet (10 mg total) by mouth daily. For allergies/ drainage   LORazepam  1 MG tablet Commonly known as: ATIVAN  Take 1/2-1 tablet daily if needed for anxiety.  May repeat dose once if needed during the day. PUT ON FILE   metoprolol  succinate 100 MG 24  hr tablet Commonly known as: TOPROL -XL Take 1 tablet (100 mg total) by mouth daily. Take with or immediately following a meal.   omeprazole  20 MG capsule Commonly known as: PRILOSEC Take 1 capsule (20 mg total) by mouth daily.   oxybutynin  5 MG 24 hr tablet Commonly known as: Ditropan  XL Take 1 tablet (5 mg total) by mouth at bedtime. For bladder   oxyCODONE  5 MG immediate release tablet Commonly known as: Roxicodone  Take 1 tablet (5 mg total) by mouth every 6 (six) hours as needed.   Ozempic  (1 MG/DOSE) 4 MG/3ML Sopn Generic  drug: Semaglutide  (1 MG/DOSE) INJECT 1 MG SUBCUTANEOUSLY ONCE A WEEK - (ADMINISTER BY SUBCUTANEOUS INJECTION INTO THE ABDOMEN, THIGH, OR UPPER ARM AT ANY TIME OF DAY ON THE SAME DAY EACH WEEK) DISCARD PEN 56 DAYS AFTER FIRST USE   Premarin  vaginal cream Generic drug: conjugated estrogens  APPLY ONE-HALF GRAM (ONE-FOURTH APPLICATORFUL) VAGINALLY THREE TIMES A  WEEK   Repatha  SureClick 140 MG/ML Soaj Generic drug: Evolocumab  Inject 140 mg into the skin every 14 (fourteen) days.   traMADol  50 MG tablet Commonly known as: ULTRAM  Take 1-2 tablets (50-100 mg total) by mouth every 6 (six) hours as needed for moderate pain (pain score 4-6).   valsartan  320 MG tablet Commonly known as: DIOVAN  Take 1 tablet (320 mg total) by mouth daily.   Vitamin D  (Ergocalciferol ) 1.25 MG (50000 UNIT) Caps capsule Commonly known as: DRISDOL  Take 1 capsule (50,000 Units total) by mouth every 7 (seven) days.               Discharge Care Instructions  (From admission, onward)           Start     Ordered   07/25/24 0000  No dressing needed        07/25/24 0801            Follow-up Information     Aron Shoulders, MD Follow up in 2 week(s).   Specialty: General Surgery Contact information: [address] [place] [address] [phone]                 Krystal Spinner, MD [facility] Office: [phone]   Signed: Krystal Spinner 07/25/2024, 8:02 AM

## 2024-07-27 ENCOUNTER — Telehealth: Payer: Self-pay

## 2024-07-27 LAB — SURGICAL PATHOLOGY

## 2024-07-27 NOTE — Transitions of Care (Post Inpatient/ED Visit) (Signed)
   07/27/2024  Name: Diana Pratt MRN: [id number] DOB: 02/26/1946  Today's TOC FU Call Status: Today's TOC FU Call Status:: Unsuccessful Call (1st Attempt) Unsuccessful Call (1st Attempt) Pratt: 07/27/24  Attempted to reach the patient regarding the most recent Inpatient/ED visit.  Follow Up Plan: Additional outreach attempts will be made to reach the patient to complete the Transitions of Care (Post Inpatient/ED visit) call.   Gurneet Matarese J. Jceon Alverio RN, MSN [facility], [facility] Health RN Care Manager Direct Dial : [phone]  Fax: [phone] Website: [email or web address]

## 2024-07-28 ENCOUNTER — Telehealth: Payer: Self-pay

## 2024-07-28 NOTE — Patient Instructions (Signed)
 Visit Information  Thank you for taking time to visit with me today. Please contact physician for any ongoing questions or concerns.    When to call your healthcare provider Call your healthcare provider right away if you have any of these: Yellowing of your eyes or skin (jaundice) Chills Fever of 100.83F (38C) or higher, or as directed by your provider  Redness or swelling of the incision Fluid leaking or a bad smell from the incision Incision pain that gets worse Dark or rust-colored urine Stool that is light in color instead of brown Increasing belly pain Rectal bleeding Trouble breathing or shortness of breath Leg swelling   Patient verbalizes understanding of instructions and care plan provided today and agrees to view in MyChart. Active MyChart status and patient understanding of how to access instructions and care plan via MyChart confirmed with patient.     The patient has been provided with contact information for the care management team and has been advised to call with any health related questions or concerns.   Please call the care guide team at [phone] if you need to cancel or reschedule your appointment.   Please call the Suicide and Crisis Lifeline: 988 call the [place]  National Suicide Prevention Lifeline: 727-165-7258 or TTY: 253-040-3973 TTY 701-352-5146) to talk to a trained counselor if you are experiencing a Mental Health or Behavioral Health Crisis or need someone to talk to.  Kalia Vahey J. Caliope Ruppert RN, MSN [facility], [facility] Health RN Care Manager Direct Dial : [phone]  Fax: [phone] Website: [email or web address]

## 2024-07-28 NOTE — Transitions of Care (Post Inpatient/ED Visit) (Signed)
 07/28/2024  Name: Diana Pratt MRN: [id number] DOB: 1946/06/14  Today's TOC FU Call Status: Today's TOC FU Call Status:: Successful TOC FU Call Completed TOC FU Call Complete Date: 07/28/24 Patient's Name and Date of Birth confirmed.  Transition Care Management Follow-up Telephone Call Date of Discharge: 07/25/24 Discharge Facility: [facility]) Type of Discharge: Inpatient Admission Primary Inpatient Discharge Diagnosis:: Chronic calculous cholecystitis How have you been since you were released from the hospital?: Better Any questions or concerns?: No  Items Reviewed: Did you receive and understand the discharge instructions provided?: Yes Medications obtained,verified, and reconciled?: Yes (Medications Reviewed) Any new allergies since your discharge?: No Dietary orders reviewed?: Yes Type of Diet Ordered:: Low sodium Heart Healthy, carb modified Do you have support at home?: Yes People in Home [RPT]: spouse Name of Support/Comfort Primary Source: Fairy  Medications Reviewed Today: Medications Reviewed Today     Reviewed by Shoshannah Faubert, RN (Case Manager) on 07/28/24 at 1523  Med List Status: <None>   Medication Order Taking? Sig Documenting Provider Last Dose Status Informant  albuterol  (PROAIR  HFA) 108 (90 Base) MCG/ACT inhaler [id number] Yes Inhale 2 puffs into the lungs every 6 (six) hours as needed for shortness of breath. Jolinda Norene HERO, DO  Active Self, Pharmacy Records  amLODipine  (NORVASC ) 10 MG tablet [id number] Yes Take 1 tablet (10 mg total) by mouth daily. Jolinda Norene HERO, DO  Active Self, Pharmacy Records  calcium  carbonate (TUMS - DOSED IN MG ELEMENTAL CALCIUM ) 500 MG chewable tablet [id number] Yes Chew 3 tablets by mouth daily. [provider]  Active Self, Pharmacy Records  conjugated estrogens  (PREMARIN ) vaginal cream [id number] Yes APPLY ONE-HALF GRAM (ONE-FOURTH APPLICATORFUL) VAGINALLY THREE TIMES A  WEEK Jolinda Norene HERO, DO  Active  Self, Pharmacy Records  Continuous Glucose Receiver [facility] G7 [place]) DEVI [id number] Yes UAD to continuously check BGs. E11.69 Jolinda Norene HERO, DO  Active Self, Pharmacy Records  Continuous Glucose Sensor [facility] G7 SENSOR) MISC [id number] Yes Check BGs continuosly. Change every 10 days. E11.69 Jolinda Norene HERO, DO  Active Self, Pharmacy Records  docusate sodium  (COLACE) 100 MG capsule [id number] Yes Take 1 capsule (100 mg total) by mouth 2 (two) times daily. Pappayliou, Dorothyann LABOR, DO  Active Self, Pharmacy Records  Evolocumab  (REPATHA  SURECLICK) 140 MG/ML SOAJ [id number] Yes Inject 140 mg into the skin every 14 (fourteen) days. Jolinda Norene HERO, DO  Active Self, Pharmacy Records  FLUoxetine  (PROZAC ) 10 MG capsule [id number] Yes Take 3 capsules (30 mg total) by mouth daily. Jolinda Norene HERO, DO  Active Self, Pharmacy Records  fluticasone  (FLONASE ) 50 MCG/ACT nasal spray [id number] Yes Place 2 sprays into both nostrils daily. [provider]  Active Self, Pharmacy Records  hydrochlorothiazide  (HYDRODIURIL ) 25 MG tablet [id number] Yes Take 1 tablet (25 mg total) by mouth daily. Jolinda Norene M, DO  Active Self, Pharmacy Records  hydroxypropyl methylcellulose (ISOPTO TEARS) 2.5 % ophthalmic solution [id number] Yes Place 1 drop into both eyes daily as needed for dry eyes. [provider]  Active Self, Pharmacy Records  insulin  glargine (LANTUS  SOLOSTAR) 100 UNIT/ML Solostar Pen [id number] Yes Inject 32 units in the morning and 23 units in the evening.  Patient taking differently: Inject 17-32 Units into the skin See admin instructions. Inject 32 units in the morning and 17 units in the evening.   Jolinda Norene HERO, DO  Active Self, Pharmacy Records           Med Note MARC, LANETA HERO   Thu Jul 23, 2024 12:23 PM) Patient states she took 16 units at 930 am on 07/23/24  loratadine  (CLARITIN ) 10 MG tablet [id number] Yes Take 1 tablet (10 mg total) by mouth daily. For allergies/  drainage Jolinda Norene HERO, DO  Active Self, Pharmacy Records  LORazepam  (ATIVAN ) 1 MG tablet [id number] Yes Take 1/2-1 tablet daily if needed for anxiety.  May repeat dose once if needed during the day. PUT ON FILE Jolinda Norene HERO, DO  Active Self, Pharmacy Records  metoprolol  succinate (TOPROL -XL) 100 MG 24 hr tablet [id number] Yes Take 1 tablet (100 mg total) by mouth daily. Take with or immediately following a meal. Jolinda Norene HERO, DO  Active Self, Pharmacy Records  Multiple Vitamins-Minerals (CENTRUM SILVER PO) [id number] Yes Take 1 tablet by mouth daily. [provider]  Active Self, Pharmacy Records  omeprazole  (PRILOSEC) 20 MG capsule [id number] Yes Take 1 capsule (20 mg total) by mouth daily. Jolinda Norene HERO, DO  Active Self, Pharmacy Records  oxybutynin  (DITROPAN  XL) 5 MG 24 hr tablet [id number]  Take 1 tablet (5 mg total) by mouth at bedtime. For bladder  Patient not taking: Reported on 07/28/2024   Jolinda Norene HERO, DO  Active Self, Pharmacy Records  oxyCODONE  (ROXICODONE ) 5 MG immediate release tablet [id number]  Take 1 tablet (5 mg total) by mouth every 6 (six) hours as needed.  Patient not taking: Reported on 07/28/2024   Pappayliou, Dorothyann LABOR, DO  Active Self, Pharmacy Records  Semaglutide , 1 MG/DOSE, (OZEMPIC , 1 MG/DOSE,) 4 MG/3ML SOPN [id number] Yes INJECT 1 MG SUBCUTANEOUSLY ONCE A WEEK - (ADMINISTER BY SUBCUTANEOUS INJECTION INTO THE ABDOMEN, THIGH, OR UPPER ARM AT ANY TIME OF DAY ON THE SAME DAY EACH WEEK) DISCARD PEN 56 DAYS AFTER FIRST USE Jolinda Norene HERO, DO  Active Self, Pharmacy Records  traMADol  (ULTRAM ) 50 MG tablet [id number] Yes Take 1-2 tablets (50-100 mg total) by mouth every 6 (six) hours as needed for moderate pain (pain score 4-6). Eletha Boas, MD  Active   valsartan  (DIOVAN ) 320 MG tablet [id number] Yes Take 1 tablet (320 mg total) by mouth daily. Jolinda Norene HERO, DO  Active Self, Pharmacy Records  Vitamin D , Ergocalciferol , (DRISDOL ) 1.25  MG (50000 UNIT) CAPS capsule [id number] Yes Take 1 capsule (50,000 Units total) by mouth every 7 (seven) days. Jolinda Norene HERO, DO  Active Self, Pharmacy Records            Home Care and Equipment/Supplies: Were Home Health Services Ordered?: NA Any new equipment or medical supplies ordered?: Yes Name of Medical supply agency?: [employer] Were you able to get the equipment/medical supplies?: Yes Do you have any questions related to the use of the equipment/supplies?: No  Functional Questionnaire: Do you need assistance with bathing/showering or dressing?: No Do you need assistance with meal preparation?: No Do you need assistance with eating?: No Do you have difficulty maintaining continence: No Do you need assistance with getting out of bed/getting out of a chair/moving?: No Do you have difficulty managing or taking your medications?: No  Follow up appointments reviewed: PCP Follow-up appointment confirmed?: No (Patient to call PCP for follow up) MD Provider Line Number:[phone] Given: No Specialist Hospital Follow-up appointment confirmed?: Yes Date of Specialist follow-up appointment?: 07/19/24 Follow-Up Specialty Provider:: Dr. Aron Do you need transportation to your follow-up appointment?: No Do you understand care options if your condition(s) worsen?: Yes-patient verbalized understanding  SDOH Interventions Today    Flowsheet Row Most Recent Value  SDOH Interventions   Food Insecurity Interventions Intervention Not  Indicated  Housing Interventions Intervention Not Indicated  Transportation Interventions Intervention Not Indicated  Utilities Interventions Intervention Not Indicated   Patient declined 30 day TOC program for further follow up.    Heath Badon J. Nijee Heatwole RN, MSN [facility], [facility] RN Care Manager Direct Dial : [phone]  Fax: [phone] Website: [email or web address]

## 2024-08-12 ENCOUNTER — Ambulatory Visit: Admitting: Gastroenterology

## 2024-08-17 ENCOUNTER — Ambulatory Visit: Payer: Medicare Other

## 2024-08-17 VITALS — BP 167/77 | HR 87 | Ht 64.0 in | Wt 198.0 lb

## 2024-08-17 DIAGNOSIS — Z0000 Encounter for general adult medical examination without abnormal findings: Secondary | ICD-10-CM | POA: Diagnosis not present

## 2024-08-17 NOTE — Progress Notes (Signed)
 Subjective:   Diana Pratt is a 78 y.o. who presents for a Medicare Wellness preventive visit.  As a reminder, Annual Wellness Visits don't include a physical exam, and some assessments may be limited, especially if this visit is performed virtually. We may recommend an in-person follow-up visit with your provider if needed.  Visit Complete: Virtual I connected with  Diana Pratt on 08/17/24 by a audio enabled telemedicine application and verified that I am speaking with the correct person using two identifiers.  Patient Location: Home  Provider Location: Home Office  I discussed the limitations of evaluation and management by telemedicine. The patient expressed understanding and agreed to proceed.  Vital Signs: Because this visit was a virtual/telehealth visit, some criteria may be missing or patient reported. Any vitals not documented were not able to be obtained and vitals that have been documented are patient reported.  VideoDeclined- This patient declined Librarian, academic. Therefore the visit was completed with audio only.  Persons Participating in Visit: Patient.  AWV Questionnaire: No: Patient Medicare AWV questionnaire was not completed prior to this visit.  Cardiac Risk Factors include: advanced age (>82men, >76 women);diabetes mellitus;dyslipidemia;hypertension;obesity (BMI >30kg/m2);smoking/ tobacco exposure     Objective:    Today's Vitals   08/17/24 0908  BP: (!) 167/77  Pulse: 87  Weight: 198 lb (89.8 kg)   Body mass index is 33.99 kg/m.     08/17/2024    9:12 AM 07/23/2024   12:24 PM 07/20/2024    9:29 AM 06/11/2024    7:13 AM 06/09/2024   10:25 AM 08/15/2023    2:37 PM 02/13/2023   10:18 AM  Advanced Directives  Does Patient Have a Medical Advance Directive? No No No No No No No  Would patient like information on creating a medical advance directive?  No - Patient declined No - Patient declined No - Patient declined No -  Patient declined Yes (MAU/Ambulatory/Procedural Areas - Information given)     Current Medications (verified) Outpatient Encounter Medications as of 08/17/2024  Medication Sig   albuterol  (PROAIR  HFA) 108 (90 Base) MCG/ACT inhaler Inhale 2 puffs into the lungs every 6 (six) hours as needed for shortness of breath.   amLODipine  (NORVASC ) 10 MG tablet Take 1 tablet (10 mg total) by mouth daily.   calcium  carbonate (TUMS - DOSED IN MG ELEMENTAL CALCIUM ) 500 MG chewable tablet Chew 3 tablets by mouth daily.   conjugated estrogens  (PREMARIN ) vaginal cream APPLY ONE-HALF GRAM (ONE-FOURTH APPLICATORFUL) VAGINALLY THREE TIMES A  WEEK   Continuous Glucose Receiver (DEXCOM G7 RECEIVER) DEVI UAD to continuously check BGs. E11.69   Continuous Glucose Sensor (DEXCOM G7 SENSOR) MISC Check BGs continuosly. Change every 10 days. E11.69   docusate sodium  (COLACE) 100 MG capsule Take 1 capsule (100 mg total) by mouth 2 (two) times daily.   Evolocumab  (REPATHA  SURECLICK) 140 MG/ML SOAJ Inject 140 mg into the skin every 14 (fourteen) days.   FLUoxetine  (PROZAC ) 10 MG capsule Take 3 capsules (30 mg total) by mouth daily.   fluticasone  (FLONASE ) 50 MCG/ACT nasal spray Place 2 sprays into both nostrils daily.   hydrochlorothiazide  (HYDRODIURIL ) 25 MG tablet Take 1 tablet (25 mg total) by mouth daily.   hydroxypropyl methylcellulose (ISOPTO TEARS) 2.5 % ophthalmic solution Place 1 drop into both eyes daily as needed for dry eyes.   insulin  glargine (LANTUS  SOLOSTAR) 100 UNIT/ML Solostar Pen Inject 32 units in the morning and 23 units in the evening. (Patient taking differently: Inject  17-32 Units into the skin See admin instructions. Inject 32 units in the morning and 17 units in the evening.)   loratadine  (CLARITIN ) 10 MG tablet Take 1 tablet (10 mg total) by mouth daily. For allergies/ drainage   LORazepam  (ATIVAN ) 1 MG tablet Take 1/2-1 tablet daily if needed for anxiety.  May repeat dose once if needed during the  day. PUT ON FILE   metoprolol  succinate (TOPROL -XL) 100 MG 24 hr tablet Take 1 tablet (100 mg total) by mouth daily. Take with or immediately following a meal.   Multiple Vitamins-Minerals (CENTRUM SILVER PO) Take 1 tablet by mouth daily.   omeprazole  (PRILOSEC) 20 MG capsule Take 1 capsule (20 mg total) by mouth daily.   Semaglutide , 1 MG/DOSE, (OZEMPIC , 1 MG/DOSE,) 4 MG/3ML SOPN INJECT 1 MG SUBCUTANEOUSLY ONCE A WEEK - (ADMINISTER BY SUBCUTANEOUS INJECTION INTO THE ABDOMEN, THIGH, OR UPPER ARM AT ANY TIME OF DAY ON THE SAME DAY EACH WEEK) DISCARD PEN 56 DAYS AFTER FIRST USE   traMADol  (ULTRAM ) 50 MG tablet Take 1-2 tablets (50-100 mg total) by mouth every 6 (six) hours as needed for moderate pain (pain score 4-6).   valsartan  (DIOVAN ) 320 MG tablet Take 1 tablet (320 mg total) by mouth daily.   Vitamin D , Ergocalciferol , (DRISDOL ) 1.25 MG (50000 UNIT) CAPS capsule Take 1 capsule (50,000 Units total) by mouth every 7 (seven) days.   oxybutynin  (DITROPAN  XL) 5 MG 24 hr tablet Take 1 tablet (5 mg total) by mouth at bedtime. For bladder (Patient not taking: Reported on 08/17/2024)   oxyCODONE  (ROXICODONE ) 5 MG immediate release tablet Take 1 tablet (5 mg total) by mouth every 6 (six) hours as needed. (Patient not taking: Reported on 08/17/2024)   No facility-administered encounter medications on file as of 08/17/2024.    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 jayson acton   Depression    Diabetes mellitus    Diabetic neuropathy (HCC)    Gallstones 04/27/2024   GERD (gastroesophageal reflux disease)    occ  heartburn   Humerus fracture    right   Hyperlipemia    Hypertension    Interstitial cystitis    Osteoporosis    Pneumonia    15 yrs ago   PONV (postoperative nausea and vomiting)    Shortness of breath dyspnea    Past Surgical History:  Procedure Laterality Date   ABDOMINAL HYSTERECTOMY  03/1984   partial   CHOLECYSTECTOMY N/A 07/23/2024   Procedure: LAPAROSCOPIC CHOLECYSTECTOMY;  Surgeon: Aron Shoulders, MD;  Location: [facility] OR;  Service: General;  Laterality: N/A;  POSSIBLE OPEN--WITH ICG   COLONOSCOPY N/A 07/19/2014   Procedure: COLONOSCOPY;  Surgeon: Margo LITTIE Haddock, MD;  Location: [facility];  Service: Endoscopy;  Laterality: N/A;  9:30   LIVER BIOPSY N/A 07/23/2024   Procedure: BIOPSY, LIVER;  Surgeon: Aron Shoulders, MD;  Location: [facility] OR;  Service: General;  Laterality: N/A;   OTHER SURGICAL HISTORY     06/2024- attempted cholecystectomy per pt- aborted procedure- did liver biopsy   REVERSE SHOULDER ARTHROPLASTY Right 06/14/2016   Procedure: REVERSE SHOULDER ARTHROPLASTY;  Surgeon: Franky Pointer, MD;  Location: [facility] OR;  Service: Orthopedics;  Laterality: Right;   Skin cancer removed  08/07/2016   left ear   Family History  Problem Relation Age of Onset   Parkinsonism Mother    Dementia Mother    Colon cancer Father 47       MULTIPLE CO-MORBIDITIES   Cancer Father    Diabetes Paternal Aunt    Diabetes Paternal Uncle    Diabetes Paternal Grandmother    Congestive Heart Failure Paternal Grandfather    Congestive Heart Failure Maternal Grandmother    Stroke Maternal Grandfather    Colon polyps Neg Hx    Social History   Socioeconomic History   Marital status: Married    Spouse name: [name]   Number of children: 1   Years of education: 13   Highest education level: Some college, no degree  Occupational History   Occupation: retired     Comment: CNA   Tobacco Use   Smoking status: Former    Current packs/day: 0.00    Average packs/day: 0.5 packs/day for 34.0 years  (17.0 ttl pk-yrs)    Types: Cigarettes    Start date: 06/01/1960    Quit date: 06/01/1994    Years since quitting: 30.2   Smokeless tobacco: Never  Vaping Use   Vaping status: Never Used  Substance and Sexual Activity   Alcohol use: No   Drug use: No   Sexual activity: Yes    Birth control/protection: Surgical    Comment: hyst  Other Topics Concern   Not on file  Social History Narrative   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[place] [employer]   Social Drivers of Corporate investment banker Strain: Low Risk  (08/17/2024)   Overall Financial Resource Strain (CARDIA)    Difficulty of Paying Living Expenses: Not hard at all  Food Insecurity: No Food Insecurity (08/17/2024)   Hunger Vital Sign    Worried About Running Out of Food in the Last Year: Never true    Ran Out of Food in the Last Year: Never true  Transportation Needs: No Transportation Needs (08/17/2024)   PRAPARE - Administrator, Civil Service (Medical): No    Lack of Transportation (Non-Medical): No  Physical Activity: Inactive (08/17/2024)   Exercise Vital Sign    Days of Exercise per Week: 0 days    Minutes of Exercise per Session: 0 min  Stress: No Stress Concern Present (08/17/2024)   [employer] of Occupational Health - Occupational Stress Questionnaire    Feeling of Stress: Not at all  Social Connections: Moderately Integrated (08/17/2024)   Social Connection and Isolation Panel    Frequency of Communication with Friends and Family: Three times a week    Frequency of Social Gatherings with Friends and Family: Three times a week    Attends Religious Services: More than 4 times per year    Active Member of Clubs or Organizations: No    Attends Banker Meetings: Never    Marital Status: Married    Tobacco Counseling Counseling given: Yes    Clinical Intake:  Pre-visit preparation completed: Yes  Pain : No/denies pain     BMI  - recorded: 33.99 Nutritional Status: BMI > 30  Obese Nutritional Risks: None Diabetes: No  Lab Results  Component Value Date   HGBA1C 5.1 07/20/2024   HGBA1C 6.4 (H) 05/11/2024   HGBA1C 5.8 (H) 11/04/2023  How often do you need to have someone help you when you read instructions, pamphlets, or other written materials from your doctor or pharmacy?: 1 - Never  Interpreter Needed?: No  Information entered by :: alia t/cma   Activities of Daily Living     08/17/2024    9:00 AM 07/24/2024    2:44 PM  In your present state of health, do you have any difficulty performing the following activities:  Hearing? 0   Vision? 0   Difficulty concentrating or making decisions? 0   Walking or climbing stairs? 1   Dressing or bathing? 0   Doing errands, shopping? 0 0  Preparing Food and eating ? N   Using the Toilet? N   In the past six months, have you accidently leaked urine? Y   Do you have problems with loss of bowel control? Y   Managing your Medications? N   Managing your Finances? N   Housekeeping or managing your Housekeeping? N     Patient Care Team: Jolinda Norene HERO, DO as PCP - General (Family Medicine) Lavona Agent, MD as PCP - Cardiology (Cardiology) Harvey Margo CROME, MD (Inactive) as Consulting Physician (Gastroenterology) Ivin Kocher, MD as Consulting Physician (Dermatology) Melita Drivers, MD as Consulting Physician (Orthopedic Surgery) Signa Delon LABOR, NP as Nurse Practitioner (Obstetrics and Gynecology) Billee Mliss BIRCH, [facility] (Pharmacist)  I have updated your Care Teams any recent Medical Services you may have received from other providers in the past year.     Assessment:   This is a routine wellness examination for Kit.  Hearing/Vision screen Hearing Screening - Comments:: Pt denies hearing dif Vision Screening - Comments:: Pt wear glasses/pt goes [facility] in [place]/last ov 2024   Goals Addressed             This Visit's Progress     Patient Stated       Pt would like to loose at least 25lbs        Depression Screen     08/17/2024    9:01 AM 07/28/2024    3:33 PM 05/11/2024    8:43 AM 08/15/2023    2:36 PM 07/02/2023    9:02 AM 01/30/2023    8:38 AM 10/01/2022    8:43 AM  PHQ 2/9 Scores  PHQ - 2 Score 0 0 1 0 0 0 0  PHQ- 9 Score   3 0 2 2 2     Fall Risk     08/17/2024    9:05 AM 07/28/2024    3:32 PM 05/26/2024    9:36 AM 08/15/2023    2:34 PM 07/02/2023    9:04 AM  Fall Risk   Falls in the past year? 0 0 0 0 0  Number falls in past yr: 0  0 0 0  Injury with Fall? 0   0 0  Risk for fall due to : No Fall Risks  No Fall Risks No Fall Risks No Fall Risks  Follow up Falls evaluation completed  Falls evaluation completed Falls prevention discussed Education provided    MEDICARE RISK AT HOME:  Medicare Risk at Home Any stairs in or around the home?: Yes If so, are there any without handrails?: Yes Home free of loose throw rugs in walkways, pet beds, electrical cords, etc?: Yes Adequate lighting in your home to reduce risk of falls?: Yes Life alert?: No Use of a cane, walker or w/c?: No Grab bars in the bathroom?: Yes Shower chair or bench in shower?:  Yes Elevated toilet seat or a handicapped toilet?: Yes  TIMED UP AND GO:  Was the test performed?  no  Cognitive Function: 6CIT completed    05/31/2016   10:56 AM  MMSE - Mini Mental State Exam  Orientation to time 5   Orientation to Place 5   Registration 3   Attention/ Calculation 5   Recall 3   Language- name 2 objects 2   Language- repeat 1  Language- follow 3 step command 3   Language- read & follow direction 1   Write a sentence 1   Copy design 1   Total score 30      Data saved with a previous flowsheet row definition        08/17/2024    9:06 AM 08/15/2023    2:37 PM 07/10/2022    4:24 PM 07/06/2020    2:08 PM 07/06/2019    3:32 PM  6CIT Screen  What Year? 0 points 0 points 0 points 0 points 0 points  What month? 0 points 0  points 0 points 0 points 0 points  What time? 0 points 0 points 0 points 0 points 0 points  Count back from 20 0 points 0 points 0 points 2 points 0 points  Months in reverse 0 points 0 points 0 points 0 points 0 points  Repeat phrase 0 points 0 points 0 points 0 points 2 points  Total Score 0 points 0 points 0 points 2 points 2 points    Immunizations Immunization History  Administered Date(s) Administered   DTaP 01/04/2003   Pneumococcal Conjugate-13 09/21/2019   Pneumococcal Polysaccharide-23 06/30/2018    Screening Tests Health Maintenance  Topic Date Due   COVID-19 Vaccine (1) Never done   Zoster Vaccines- Shingrix (1 of 2) Never done   Influenza Vaccine  Never done   OPHTHALMOLOGY EXAM  07/01/2024   DTaP/Tdap/Td (2 - Tdap) 11/03/2024 (Originally 01/03/2013)   DEXA SCAN  05/11/2025 (Originally 06/06/2019)   Colonoscopy  05/11/2025 (Originally 07/19/2024)   HEMOGLOBIN A1C  01/17/2025   Diabetic kidney evaluation - Urine ACR  05/11/2025   FOOT EXAM  05/11/2025   Diabetic kidney evaluation - eGFR measurement  07/24/2025   Medicare Annual Wellness (AWV)  08/17/2025   Pneumococcal Vaccine: 50+ Years  Completed   Hepatitis C Screening  Completed   Meningococcal B Vaccine  Aged Out   Mammogram  Discontinued    Health Maintenance Items Addressed: See Nurse Notes at the end of this note  Additional Screening:  Vision Screening: Recommended annual ophthalmology exams for early detection of glaucoma and other disorders of the eye. Is the patient up to date with their annual eye exam?  No  Who is the provider or what is the name of the office in which the patient attends annual eye exams? [facility] in [place]/Dr Webster  Dental Screening: Recommended annual dental exams for proper oral hygiene  Community Resource Referral / Chronic Care Management: CRR required this visit?  No   CCM required this visit?  No   Plan:    I have personally reviewed and noted the following in the  patient's chart:   Medical and social history Use of alcohol, tobacco or illicit drugs  Current medications and supplements including opioid prescriptions. Patient is not currently taking opioid prescriptions. Functional ability and status Nutritional status Physical activity Advanced directives List of other physicians Hospitalizations, surgeries, and ER visits in previous 12 months Vitals Screenings to include cognitive, depression, and falls Referrals  and appointments  In addition, I have reviewed and discussed with patient certain preventive protocols, quality metrics, and best practice recommendations. A written personalized care plan for preventive services as well as general preventive health recommendations were provided to patient.   Ozie Ned, CMA   08/17/2024   After Visit Summary: (MyChart) Due to this being a telephonic visit, the after visit summary with patients personalized plan was offered to patient via MyChart   Notes: PCP Follow Up Recommendations: Pt is aware and due the following: diabetic eye exam, flu,covid,shingles vaccines

## 2024-08-17 NOTE — Patient Instructions (Signed)
 Diana Pratt,  Thank you for taking the time for your Medicare Wellness Visit. I appreciate your continued commitment to your health goals. Please review the care plan we discussed, and feel free to reach out if I can assist you further.  Medicare recommends these wellness visits once per year to help you and your care team stay ahead of potential health issues. These visits are designed to focus on prevention, allowing your provider to concentrate on managing your acute and chronic conditions during your regular appointments.  Please note that Annual Wellness Visits do not include a physical exam. Some assessments may be limited, especially if the visit was conducted virtually. If needed, we may recommend a separate in-person follow-up with your provider.  Ongoing Care Seeing your primary care provider every 3 to 6 months helps us  monitor your health and provide consistent, personalized care.   Referrals If a referral was made during today's visit and you haven't received any updates within two weeks, please contact the referred provider directly to check on the status.  Recommended Screenings:  Health Maintenance  Topic Date Due   COVID-19 Vaccine (1) Never done   Zoster (Shingles) Vaccine (1 of 2) Never done   Flu Shot  Never done   Eye exam for diabetics  07/01/2024   Medicare Annual Wellness Visit  08/14/2024   DTaP/Tdap/Td vaccine (2 - Tdap) 11/03/2024*   DEXA scan (bone density measurement)  05/11/2025*   Colon Cancer Screening  05/11/2025*   Hemoglobin A1C  01/17/2025   Yearly kidney health urinalysis for diabetes  05/11/2025   Complete foot exam   05/11/2025   Yearly kidney function blood test for diabetes  07/24/2025   Pneumococcal Vaccine for age over 57  Completed   Hepatitis C Screening  Completed   Meningitis B Vaccine  Aged Out   Breast Cancer Screening  Discontinued  *Topic was postponed. The date shown is not the original due date.       07/23/2024   12:24 PM   Advanced Directives  Does Patient Have a Medical Advance Directive? No  Would patient like information on creating a medical advance directive? No - Patient declined   Advance Care Planning is important because it: Ensures you receive medical care that aligns with your values, goals, and preferences. Provides guidance to your family and loved ones, reducing the emotional burden of decision-making during critical moments.  Vision: Annual vision screenings are recommended for early detection of glaucoma, cataracts, and diabetic retinopathy. These exams can also reveal signs of chronic conditions such as diabetes and high blood pressure.  Dental: Annual dental screenings help detect early signs of oral cancer, gum disease, and other conditions linked to overall health, including heart disease and diabetes.  Please see the attached documents for additional preventive care recommendations.

## 2024-08-18 DIAGNOSIS — K8010 Calculus of gallbladder with chronic cholecystitis without obstruction: Secondary | ICD-10-CM | POA: Diagnosis not present

## 2024-08-18 DIAGNOSIS — R112 Nausea with vomiting, unspecified: Secondary | ICD-10-CM | POA: Diagnosis not present

## 2024-08-20 ENCOUNTER — Encounter: Payer: Self-pay | Admitting: Internal Medicine

## 2024-09-23 ENCOUNTER — Telehealth: Payer: Self-pay | Admitting: Family Medicine

## 2024-09-23 DIAGNOSIS — E1169 Type 2 diabetes mellitus with other specified complication: Secondary | ICD-10-CM

## 2024-09-23 NOTE — Telephone Encounter (Signed)
 Copied from CRM [id number]. Topic: Clinical - Medication Refill >> Sep 23, 2024  9:54 AM Roselie BROCKS wrote: Medication: ACCU-CHEK AVIVA PLUS test strips  Has the patient contacted their pharmacy? Yes (Agent: If no, request that the patient contact the pharmacy for the refill. If patient does not wish to contact the pharmacy document the reason why and proceed with request.) (Agent: If yes, when and what did the pharmacy advise?)  This is the patient's preferred pharmacy:  [facility] - [place], [place] - [address] [facility] [address] [place] [address] Phone: [phone] Fax: [phone]  Is this the correct pharmacy for this prescription? Yes If no, delete pharmacy and type the correct one.   Has the prescription been filled recently? No  Is the patient out of the medication? No  Has the patient been seen for an appointment in the last year OR does the patient have an upcoming appointment? Yes  Can we respond through MyChart? Yes  Agent: Please be advised that Rx refills may take up to 3 business days. We ask that you follow-up with your pharmacy.

## 2024-09-23 NOTE — Telephone Encounter (Signed)
:   ACCU-CHEK AVIVA PLUS test strips  Not on active med list

## 2024-09-25 MED ORDER — ACCU-CHEK AVIVA PLUS VI STRP: ORAL_STRIP | 12 refills | Status: DC

## 2024-09-25 NOTE — Telephone Encounter (Signed)
 done

## 2024-10-12 MED ORDER — BLOOD GLUCOSE TEST STRIPS 333 VI STRP: 100.0000 | ORAL_STRIP | Freq: Three times a day (TID) | 5 refills | Status: AC

## 2024-10-12 NOTE — Telephone Encounter (Signed)
 Sent corrected strips to pharmacy

## 2024-10-12 NOTE — Addendum Note (Signed)
 Addended by: MICHELINE ROSINA FALCON on: 10/12/2024 10:33 AM   Modules accepted: Orders

## 2024-10-12 NOTE — Telephone Encounter (Unsigned)
 Copied from CRM #[id number]. Topic: Clinical - Prescription Issue >> Oct 09, 2024  4:08 PM Selinda RAMAN wrote: Reason for CRM: The patient called in stating the wrong test strips were called in. She says the glucose blood (ACCU-CHEK AVIVA PLUS) test strip were not correct as she needs the ACCU-CHEK Guide Meter test strips. Can these please be called into her pharmacy  [facility] [place], [place] - [address] [facility]  Phone: [phone] Fax: [phone]   Please assist patient further

## 2024-11-18 ENCOUNTER — Encounter: Payer: Self-pay | Admitting: Family Medicine

## 2024-11-18 ENCOUNTER — Other Ambulatory Visit: Payer: Self-pay | Admitting: Family Medicine

## 2024-11-18 DIAGNOSIS — F418 Other specified anxiety disorders: Secondary | ICD-10-CM

## 2024-11-18 DIAGNOSIS — Z79899 Other long term (current) drug therapy: Secondary | ICD-10-CM

## 2024-12-08 ENCOUNTER — Ambulatory Visit: Payer: Self-pay | Admitting: Family Medicine

## 2024-12-08 ENCOUNTER — Ambulatory Visit

## 2024-12-08 ENCOUNTER — Ambulatory Visit: Admitting: Family Medicine

## 2024-12-08 ENCOUNTER — Encounter: Payer: Self-pay | Admitting: Family Medicine

## 2024-12-08 VITALS — BP 137/55 | HR 90 | Temp 97.2°F | Ht 64.0 in | Wt 192.2 lb

## 2024-12-08 DIAGNOSIS — Z0000 Encounter for general adult medical examination without abnormal findings: Secondary | ICD-10-CM

## 2024-12-08 DIAGNOSIS — Z794 Long term (current) use of insulin: Secondary | ICD-10-CM

## 2024-12-08 DIAGNOSIS — E1169 Type 2 diabetes mellitus with other specified complication: Secondary | ICD-10-CM | POA: Diagnosis not present

## 2024-12-08 DIAGNOSIS — K219 Gastro-esophageal reflux disease without esophagitis: Secondary | ICD-10-CM

## 2024-12-08 DIAGNOSIS — Z0001 Encounter for general adult medical examination with abnormal findings: Secondary | ICD-10-CM | POA: Diagnosis not present

## 2024-12-08 DIAGNOSIS — J329 Chronic sinusitis, unspecified: Secondary | ICD-10-CM

## 2024-12-08 DIAGNOSIS — T466X5A Adverse effect of antihyperlipidemic and antiarteriosclerotic drugs, initial encounter: Secondary | ICD-10-CM

## 2024-12-08 DIAGNOSIS — F418 Other specified anxiety disorders: Secondary | ICD-10-CM

## 2024-12-08 DIAGNOSIS — E559 Vitamin D deficiency, unspecified: Secondary | ICD-10-CM

## 2024-12-08 DIAGNOSIS — E1159 Type 2 diabetes mellitus with other circulatory complications: Secondary | ICD-10-CM | POA: Diagnosis not present

## 2024-12-08 DIAGNOSIS — N3281 Overactive bladder: Secondary | ICD-10-CM

## 2024-12-08 DIAGNOSIS — I152 Hypertension secondary to endocrine disorders: Secondary | ICD-10-CM | POA: Diagnosis not present

## 2024-12-08 DIAGNOSIS — Z79899 Other long term (current) drug therapy: Secondary | ICD-10-CM

## 2024-12-08 DIAGNOSIS — G720 Drug-induced myopathy: Secondary | ICD-10-CM | POA: Diagnosis not present

## 2024-12-08 DIAGNOSIS — E1142 Type 2 diabetes mellitus with diabetic polyneuropathy: Secondary | ICD-10-CM

## 2024-12-08 DIAGNOSIS — E785 Hyperlipidemia, unspecified: Secondary | ICD-10-CM | POA: Diagnosis not present

## 2024-12-08 LAB — BAYER DCA HB A1C WAIVED: HB A1C (BAYER DCA - WAIVED): 5.9 % — ABNORMAL HIGH (ref 4.8–5.6)

## 2024-12-08 LAB — LIPID PANEL

## 2024-12-08 MED ORDER — OZEMPIC (1 MG/DOSE) 4 MG/3ML ~~LOC~~ SOPN: PEN_INJECTOR | SUBCUTANEOUS | 3 refills | Status: AC

## 2024-12-08 MED ORDER — FLUCONAZOLE 150 MG PO TABS: 150.0000 mg | ORAL_TABLET | Freq: Once | ORAL | 0 refills | Status: AC

## 2024-12-08 MED ORDER — VITAMIN D (ERGOCALCIFEROL) 1.25 MG (50000 UNIT) PO CAPS: 50000.0000 [IU] | ORAL_CAPSULE | ORAL | 3 refills | Status: AC

## 2024-12-08 MED ORDER — VALSARTAN 320 MG PO TABS: 320.0000 mg | ORAL_TABLET | Freq: Every day | ORAL | 3 refills | Status: AC

## 2024-12-08 MED ORDER — DOXYCYCLINE HYCLATE 100 MG PO TABS: 100.0000 mg | ORAL_TABLET | Freq: Two times a day (BID) | ORAL | 0 refills | Status: AC

## 2024-12-08 MED ORDER — FLUOXETINE HCL 10 MG PO CAPS: 30.0000 mg | ORAL_CAPSULE | Freq: Every day | ORAL | 3 refills | Status: AC

## 2024-12-08 MED ORDER — HYDROCHLOROTHIAZIDE 25 MG PO TABS: 25.0000 mg | ORAL_TABLET | Freq: Every day | ORAL | 3 refills | Status: AC

## 2024-12-08 MED ORDER — AMLODIPINE BESYLATE 10 MG PO TABS: 10.0000 mg | ORAL_TABLET | Freq: Every day | ORAL | 3 refills | Status: AC

## 2024-12-08 MED ORDER — OXYBUTYNIN CHLORIDE ER 5 MG PO TB24: 5.0000 mg | ORAL_TABLET | Freq: Every day | ORAL | 0 refills | Status: AC

## 2024-12-08 MED ORDER — REPATHA SURECLICK 140 MG/ML ~~LOC~~ SOAJ: 140.0000 mg | SUBCUTANEOUS | 4 refills | Status: AC

## 2024-12-08 MED ORDER — LANTUS SOLOSTAR 100 UNIT/ML ~~LOC~~ SOPN: PEN_INJECTOR | SUBCUTANEOUS | 3 refills | Status: AC

## 2024-12-08 MED ORDER — LORAZEPAM 1 MG PO TABS: ORAL_TABLET | ORAL | 5 refills | Status: AC

## 2024-12-08 MED ORDER — OMEPRAZOLE 20 MG PO CPDR: 20.0000 mg | DELAYED_RELEASE_CAPSULE | Freq: Every day | ORAL | 3 refills | Status: AC

## 2024-12-08 MED ORDER — ALPHA-LIPOIC ACID 600 MG PO CAPS: 600.0000 mg | ORAL_CAPSULE | Freq: Every day | ORAL | 4 refills | Status: AC

## 2024-12-08 MED ORDER — METOPROLOL SUCCINATE ER 100 MG PO TB24: 100.0000 mg | ORAL_TABLET | Freq: Every day | ORAL | 3 refills | Status: AC

## 2024-12-08 MED ORDER — DEXCOM G7 SENSOR MISC: 3 refills | Status: AC

## 2024-12-09 LAB — CBC WITH DIFFERENTIAL/PLATELET
Basophils Absolute: 0 10*3/uL (ref 0.0–0.2)
Basos: 0 %
EOS (ABSOLUTE): 0.1 10*3/uL (ref 0.0–0.4)
Eos: 1 %
Hematocrit: 43.8 % (ref 34.0–46.6)
Hemoglobin: 14.6 g/dL (ref 11.1–15.9)
Immature Grans (Abs): 0 10*3/uL (ref 0.0–0.1)
Immature Granulocytes: 0 %
Lymphocytes Absolute: 2.9 10*3/uL (ref 0.7–3.1)
Lymphs: 37 %
MCH: 30.6 pg (ref 26.6–33.0)
MCHC: 33.3 g/dL (ref 31.5–35.7)
MCV: 92 fL (ref 79–97)
Monocytes Absolute: 0.5 10*3/uL (ref 0.1–0.9)
Monocytes: 7 %
Neutrophils Absolute: 4.2 10*3/uL (ref 1.4–7.0)
Neutrophils: 55 %
Platelets: 211 10*3/uL (ref 150–450)
RBC: 4.77 x10E6/uL (ref 3.77–5.28)
RDW: 13.3 % (ref 11.7–15.4)
WBC: 7.7 10*3/uL (ref 3.4–10.8)

## 2024-12-09 LAB — CMP14+EGFR
ALT: 14 [IU]/L (ref 0–32)
AST: 17 [IU]/L (ref 0–40)
Albumin: 4.4 g/dL (ref 3.8–4.8)
Alkaline Phosphatase: 94 [IU]/L (ref 49–135)
BUN/Creatinine Ratio: 18 (ref 12–28)
BUN: 14 mg/dL (ref 8–27)
Bilirubin Total: 0.3 mg/dL (ref 0.0–1.2)
CO2: 23 mmol/L (ref 20–29)
Calcium: 9.6 mg/dL (ref 8.7–10.3)
Chloride: 102 mmol/L (ref 96–106)
Creatinine, Ser: 0.8 mg/dL (ref 0.57–1.00)
Globulin, Total: 2.1 g/dL (ref 1.5–4.5)
Glucose: 83 mg/dL (ref 70–99)
Potassium: 4.9 mmol/L (ref 3.5–5.2)
Sodium: 142 mmol/L (ref 134–144)
Total Protein: 6.5 g/dL (ref 6.0–8.5)
eGFR: 75 mL/min/{1.73_m2}

## 2024-12-09 LAB — LIPID PANEL
Cholesterol, Total: 200 mg/dL — AB (ref 100–199)
HDL: 41 mg/dL
LDL CALC COMMENT:: 4.9 ratio — AB (ref 0.0–4.4)
LDL Chol Calc (NIH): 136 mg/dL — AB (ref 0–99)
Triglycerides: 127 mg/dL (ref 0–149)
VLDL Cholesterol Cal: 23 mg/dL (ref 5–40)

## 2024-12-09 LAB — VITAMIN D 25 HYDROXY (VIT D DEFICIENCY, FRACTURES): Vit D, 25-Hydroxy: 38.5 ng/mL (ref 30.0–100.0)

## 2024-12-09 LAB — MICROALBUMIN / CREATININE URINE RATIO
Creatinine, Urine: 76.4 mg/dL
Microalb/Creat Ratio: 6 mg/g{creat} (ref 0–29)
Microalbumin, Urine: 4.6 ug/mL

## 2024-12-09 LAB — TSH: TSH: 1.54 u[IU]/mL (ref 0.450–4.500)

## 2024-12-30 ENCOUNTER — Ambulatory Visit

## 2025-06-07 ENCOUNTER — Ambulatory Visit: Admitting: Family Medicine

## 2025-08-18 ENCOUNTER — Ambulatory Visit: Payer: Self-pay

## 2025-12-10 ENCOUNTER — Encounter: Admitting: Family Medicine
